# Patient Record
Sex: Female | Born: 1975 | Race: White | Hispanic: No | Marital: Single | Smoking: Former smoker
Health system: Southern US, Community
[De-identification: ages and names within clinical notes are randomized; demographics above are authoritative.]

## PROBLEM LIST (undated history)

## (undated) DIAGNOSIS — Z8719 Personal history of other diseases of the digestive system: Secondary | ICD-10-CM

## (undated) DIAGNOSIS — D649 Anemia, unspecified: Secondary | ICD-10-CM

## (undated) DIAGNOSIS — M549 Dorsalgia, unspecified: Secondary | ICD-10-CM

## (undated) DIAGNOSIS — K589 Irritable bowel syndrome without diarrhea: Secondary | ICD-10-CM

## (undated) DIAGNOSIS — R351 Nocturia: Secondary | ICD-10-CM

## (undated) DIAGNOSIS — G8929 Other chronic pain: Secondary | ICD-10-CM

## (undated) DIAGNOSIS — F329 Major depressive disorder, single episode, unspecified: Secondary | ICD-10-CM

## (undated) DIAGNOSIS — G43909 Migraine, unspecified, not intractable, without status migrainosus: Secondary | ICD-10-CM

## (undated) DIAGNOSIS — R42 Dizziness and giddiness: Secondary | ICD-10-CM

## (undated) DIAGNOSIS — K861 Other chronic pancreatitis: Secondary | ICD-10-CM

## (undated) DIAGNOSIS — Z8742 Personal history of other diseases of the female genital tract: Secondary | ICD-10-CM

## (undated) DIAGNOSIS — Z8614 Personal history of Methicillin resistant Staphylococcus aureus infection: Secondary | ICD-10-CM

## (undated) DIAGNOSIS — R3915 Urgency of urination: Secondary | ICD-10-CM

## (undated) DIAGNOSIS — R3989 Other symptoms and signs involving the genitourinary system: Secondary | ICD-10-CM

## (undated) DIAGNOSIS — F32A Depression, unspecified: Secondary | ICD-10-CM

## (undated) DIAGNOSIS — R350 Frequency of micturition: Secondary | ICD-10-CM

## (undated) DIAGNOSIS — Z90722 Acquired absence of ovaries, bilateral: Secondary | ICD-10-CM

## (undated) DIAGNOSIS — Z90710 Acquired absence of both cervix and uterus: Secondary | ICD-10-CM

## (undated) DIAGNOSIS — Z87410 Personal history of cervical dysplasia: Secondary | ICD-10-CM

## (undated) DIAGNOSIS — Z9079 Acquired absence of other genital organ(s): Secondary | ICD-10-CM

## (undated) DIAGNOSIS — F1120 Opioid dependence, uncomplicated: Secondary | ICD-10-CM

## (undated) HISTORY — DX: Major depressive disorder, single episode, unspecified: F32.9

## (undated) HISTORY — DX: Depression, unspecified: F32.A

## (undated) HISTORY — DX: Personal history of Methicillin resistant Staphylococcus aureus infection: Z86.14

## (undated) HISTORY — DX: Opioid dependence, uncomplicated: F11.20

## (undated) HISTORY — DX: Migraine, unspecified, not intractable, without status migrainosus: G43.909

## (undated) HISTORY — DX: Acquired absence of ovaries, bilateral: Z90.722

## (undated) HISTORY — DX: Acquired absence of both cervix and uterus: Z90.710

## (undated) HISTORY — DX: Irritable bowel syndrome, unspecified: K58.9

## (undated) HISTORY — DX: Acquired absence of other genital organ(s): Z90.79

## (undated) HISTORY — DX: Dizziness and giddiness: R42

---

## 1998-09-21 HISTORY — PX: LAPAROSCOPY: SHX197

## 2002-09-05 ENCOUNTER — Emergency Department (HOSPITAL_COMMUNITY): Admission: EM | Admit: 2002-09-05 | Discharge: 2002-09-05 | Payer: Self-pay | Admitting: *Deleted

## 2002-09-21 HISTORY — PX: CYSTOSCOPY WITH HYDRODISTENSION AND BIOPSY: SHX5127

## 2002-09-21 HISTORY — PX: DIAGNOSTIC LAPAROSCOPY: SUR761

## 2002-11-24 ENCOUNTER — Encounter: Admission: RE | Admit: 2002-11-24 | Discharge: 2002-11-24 | Payer: Self-pay | Admitting: Gastroenterology

## 2002-11-24 ENCOUNTER — Encounter: Payer: Self-pay | Admitting: Gastroenterology

## 2003-01-27 ENCOUNTER — Encounter: Payer: Self-pay | Admitting: Emergency Medicine

## 2003-01-27 ENCOUNTER — Emergency Department (HOSPITAL_COMMUNITY): Admission: EM | Admit: 2003-01-27 | Discharge: 2003-01-27 | Payer: Self-pay | Admitting: Emergency Medicine

## 2003-03-16 ENCOUNTER — Inpatient Hospital Stay (HOSPITAL_COMMUNITY): Admission: AD | Admit: 2003-03-16 | Discharge: 2003-03-16 | Payer: Self-pay | Admitting: Obstetrics and Gynecology

## 2003-03-20 ENCOUNTER — Encounter (INDEPENDENT_AMBULATORY_CARE_PROVIDER_SITE_OTHER): Payer: Self-pay | Admitting: Specialist

## 2003-03-20 ENCOUNTER — Ambulatory Visit (HOSPITAL_BASED_OUTPATIENT_CLINIC_OR_DEPARTMENT_OTHER): Admission: RE | Admit: 2003-03-20 | Discharge: 2003-03-20 | Payer: Self-pay | Admitting: Urology

## 2003-06-13 ENCOUNTER — Ambulatory Visit (HOSPITAL_COMMUNITY): Admission: RE | Admit: 2003-06-13 | Discharge: 2003-06-13 | Payer: Self-pay | Admitting: Gastroenterology

## 2003-06-13 ENCOUNTER — Encounter (INDEPENDENT_AMBULATORY_CARE_PROVIDER_SITE_OTHER): Payer: Self-pay | Admitting: Specialist

## 2003-07-17 ENCOUNTER — Inpatient Hospital Stay (HOSPITAL_COMMUNITY): Admission: AD | Admit: 2003-07-17 | Discharge: 2003-07-17 | Payer: Self-pay | Admitting: *Deleted

## 2003-09-22 HISTORY — PX: OTHER SURGICAL HISTORY: SHX169

## 2004-02-15 ENCOUNTER — Encounter: Admission: RE | Admit: 2004-02-15 | Discharge: 2004-02-15 | Payer: Self-pay | Admitting: Family Medicine

## 2004-04-12 ENCOUNTER — Emergency Department (HOSPITAL_COMMUNITY): Admission: EM | Admit: 2004-04-12 | Discharge: 2004-04-12 | Payer: Self-pay | Admitting: Emergency Medicine

## 2004-06-07 ENCOUNTER — Ambulatory Visit (HOSPITAL_COMMUNITY): Admission: AD | Admit: 2004-06-07 | Discharge: 2004-06-08 | Payer: Self-pay | Admitting: Obstetrics and Gynecology

## 2004-06-07 ENCOUNTER — Encounter (INDEPENDENT_AMBULATORY_CARE_PROVIDER_SITE_OTHER): Payer: Self-pay | Admitting: *Deleted

## 2004-09-21 DIAGNOSIS — Z90710 Acquired absence of both cervix and uterus: Secondary | ICD-10-CM

## 2004-09-21 HISTORY — PX: TOTAL ABDOMINAL HYSTERECTOMY W/ BILATERAL SALPINGOOPHORECTOMY: SHX83

## 2004-09-21 HISTORY — DX: Acquired absence of both cervix and uterus: Z90.710

## 2004-10-01 ENCOUNTER — Ambulatory Visit (HOSPITAL_COMMUNITY): Admission: RE | Admit: 2004-10-01 | Discharge: 2004-10-01 | Payer: Self-pay | Admitting: Family Medicine

## 2004-11-30 ENCOUNTER — Emergency Department (HOSPITAL_COMMUNITY): Admission: EM | Admit: 2004-11-30 | Discharge: 2004-11-30 | Payer: Self-pay | Admitting: Emergency Medicine

## 2004-12-01 ENCOUNTER — Emergency Department (HOSPITAL_COMMUNITY): Admission: EM | Admit: 2004-12-01 | Discharge: 2004-12-01 | Payer: Self-pay | Admitting: Emergency Medicine

## 2005-01-03 ENCOUNTER — Emergency Department (HOSPITAL_COMMUNITY): Admission: EM | Admit: 2005-01-03 | Discharge: 2005-01-03 | Payer: Self-pay | Admitting: Emergency Medicine

## 2005-01-16 ENCOUNTER — Emergency Department (HOSPITAL_COMMUNITY): Admission: EM | Admit: 2005-01-16 | Discharge: 2005-01-16 | Payer: Self-pay | Admitting: Emergency Medicine

## 2005-02-11 ENCOUNTER — Emergency Department (HOSPITAL_COMMUNITY): Admission: EM | Admit: 2005-02-11 | Discharge: 2005-02-11 | Payer: Self-pay | Admitting: Emergency Medicine

## 2005-02-15 ENCOUNTER — Emergency Department (HOSPITAL_COMMUNITY): Admission: EM | Admit: 2005-02-15 | Discharge: 2005-02-15 | Payer: Self-pay | Admitting: Emergency Medicine

## 2005-02-25 ENCOUNTER — Inpatient Hospital Stay (HOSPITAL_COMMUNITY): Admission: RE | Admit: 2005-02-25 | Discharge: 2005-02-27 | Payer: Self-pay | Admitting: Obstetrics & Gynecology

## 2005-03-07 ENCOUNTER — Encounter (HOSPITAL_COMMUNITY): Admission: RE | Admit: 2005-03-07 | Discharge: 2005-04-06 | Payer: Self-pay | Admitting: Obstetrics and Gynecology

## 2005-10-16 ENCOUNTER — Emergency Department (HOSPITAL_COMMUNITY): Admission: EM | Admit: 2005-10-16 | Discharge: 2005-10-16 | Payer: Self-pay | Admitting: Emergency Medicine

## 2005-12-31 ENCOUNTER — Emergency Department (HOSPITAL_COMMUNITY): Admission: EM | Admit: 2005-12-31 | Discharge: 2005-12-31 | Payer: Self-pay | Admitting: Emergency Medicine

## 2006-01-02 ENCOUNTER — Emergency Department (HOSPITAL_COMMUNITY): Admission: EM | Admit: 2006-01-02 | Discharge: 2006-01-02 | Payer: Self-pay | Admitting: Emergency Medicine

## 2006-01-18 ENCOUNTER — Encounter: Admission: RE | Admit: 2006-01-18 | Discharge: 2006-01-18 | Payer: Self-pay | Admitting: Family Medicine

## 2006-02-08 ENCOUNTER — Emergency Department (HOSPITAL_COMMUNITY): Admission: EM | Admit: 2006-02-08 | Discharge: 2006-02-08 | Payer: Self-pay | Admitting: Emergency Medicine

## 2006-02-12 ENCOUNTER — Encounter: Admission: RE | Admit: 2006-02-12 | Discharge: 2006-02-12 | Payer: Self-pay | Admitting: Orthopedic Surgery

## 2006-03-03 ENCOUNTER — Emergency Department (HOSPITAL_COMMUNITY): Admission: EM | Admit: 2006-03-03 | Discharge: 2006-03-03 | Payer: Self-pay | Admitting: Emergency Medicine

## 2006-08-08 ENCOUNTER — Emergency Department (HOSPITAL_COMMUNITY): Admission: EM | Admit: 2006-08-08 | Discharge: 2006-08-08 | Payer: Self-pay | Admitting: Emergency Medicine

## 2006-08-24 ENCOUNTER — Emergency Department (HOSPITAL_COMMUNITY): Admission: EM | Admit: 2006-08-24 | Discharge: 2006-08-24 | Payer: Self-pay | Admitting: Emergency Medicine

## 2006-10-13 ENCOUNTER — Emergency Department (HOSPITAL_COMMUNITY): Admission: EM | Admit: 2006-10-13 | Discharge: 2006-10-13 | Payer: Self-pay | Admitting: Emergency Medicine

## 2006-11-16 ENCOUNTER — Emergency Department (HOSPITAL_COMMUNITY): Admission: EM | Admit: 2006-11-16 | Discharge: 2006-11-16 | Payer: Self-pay | Admitting: Emergency Medicine

## 2007-01-23 ENCOUNTER — Emergency Department (HOSPITAL_COMMUNITY): Admission: EM | Admit: 2007-01-23 | Discharge: 2007-01-23 | Payer: Self-pay | Admitting: Emergency Medicine

## 2007-06-18 ENCOUNTER — Emergency Department (HOSPITAL_COMMUNITY): Admission: EM | Admit: 2007-06-18 | Discharge: 2007-06-18 | Payer: Self-pay | Admitting: Emergency Medicine

## 2007-07-06 ENCOUNTER — Emergency Department (HOSPITAL_COMMUNITY): Admission: EM | Admit: 2007-07-06 | Discharge: 2007-07-06 | Payer: Self-pay | Admitting: Family Medicine

## 2007-08-19 ENCOUNTER — Emergency Department (HOSPITAL_COMMUNITY): Admission: EM | Admit: 2007-08-19 | Discharge: 2007-08-20 | Payer: Self-pay | Admitting: Emergency Medicine

## 2007-08-23 ENCOUNTER — Inpatient Hospital Stay (HOSPITAL_COMMUNITY): Admission: EM | Admit: 2007-08-23 | Discharge: 2007-08-26 | Payer: Self-pay | Admitting: Emergency Medicine

## 2007-10-06 ENCOUNTER — Emergency Department (HOSPITAL_COMMUNITY): Admission: EM | Admit: 2007-10-06 | Discharge: 2007-10-06 | Payer: Self-pay | Admitting: Emergency Medicine

## 2008-02-10 ENCOUNTER — Emergency Department (HOSPITAL_COMMUNITY): Admission: EM | Admit: 2008-02-10 | Discharge: 2008-02-10 | Payer: Self-pay | Admitting: Emergency Medicine

## 2008-03-17 ENCOUNTER — Emergency Department (HOSPITAL_COMMUNITY): Admission: EM | Admit: 2008-03-17 | Discharge: 2008-03-17 | Payer: Self-pay | Admitting: Emergency Medicine

## 2008-05-04 ENCOUNTER — Emergency Department (HOSPITAL_COMMUNITY): Admission: EM | Admit: 2008-05-04 | Discharge: 2008-05-04 | Payer: Self-pay | Admitting: Emergency Medicine

## 2009-02-22 ENCOUNTER — Emergency Department (HOSPITAL_COMMUNITY): Admission: EM | Admit: 2009-02-22 | Discharge: 2009-02-22 | Payer: Self-pay | Admitting: Emergency Medicine

## 2009-04-23 ENCOUNTER — Encounter: Admission: RE | Admit: 2009-04-23 | Discharge: 2009-04-23 | Payer: Self-pay | Admitting: Internal Medicine

## 2009-05-30 ENCOUNTER — Encounter: Admission: RE | Admit: 2009-05-30 | Discharge: 2009-05-30 | Payer: Self-pay | Admitting: Internal Medicine

## 2009-06-11 ENCOUNTER — Emergency Department (HOSPITAL_COMMUNITY): Admission: EM | Admit: 2009-06-11 | Discharge: 2009-06-11 | Payer: Self-pay | Admitting: Emergency Medicine

## 2009-06-15 ENCOUNTER — Emergency Department (HOSPITAL_COMMUNITY): Admission: EM | Admit: 2009-06-15 | Discharge: 2009-06-15 | Payer: Self-pay | Admitting: Emergency Medicine

## 2009-06-20 ENCOUNTER — Emergency Department (HOSPITAL_COMMUNITY): Admission: EM | Admit: 2009-06-20 | Discharge: 2009-06-21 | Payer: Self-pay | Admitting: Emergency Medicine

## 2009-06-20 ENCOUNTER — Emergency Department (HOSPITAL_COMMUNITY): Admission: EM | Admit: 2009-06-20 | Discharge: 2009-06-20 | Payer: Self-pay | Admitting: Family Medicine

## 2009-06-30 ENCOUNTER — Emergency Department (HOSPITAL_COMMUNITY): Admission: EM | Admit: 2009-06-30 | Discharge: 2009-06-30 | Payer: Self-pay | Admitting: Emergency Medicine

## 2009-07-03 ENCOUNTER — Telehealth: Payer: Self-pay | Admitting: Internal Medicine

## 2009-07-04 ENCOUNTER — Emergency Department (HOSPITAL_COMMUNITY): Admission: EM | Admit: 2009-07-04 | Discharge: 2009-07-04 | Payer: Self-pay | Admitting: Emergency Medicine

## 2009-07-14 ENCOUNTER — Emergency Department (HOSPITAL_COMMUNITY): Admission: EM | Admit: 2009-07-14 | Discharge: 2009-07-14 | Payer: Self-pay | Admitting: Emergency Medicine

## 2009-07-17 ENCOUNTER — Encounter (INDEPENDENT_AMBULATORY_CARE_PROVIDER_SITE_OTHER): Payer: Self-pay | Admitting: Surgery

## 2009-07-17 ENCOUNTER — Ambulatory Visit (HOSPITAL_COMMUNITY): Admission: RE | Admit: 2009-07-17 | Discharge: 2009-07-18 | Payer: Self-pay | Admitting: Surgery

## 2009-07-17 HISTORY — PX: LAPAROSCOPIC CHOLECYSTECTOMY: SUR755

## 2009-07-22 ENCOUNTER — Encounter: Admission: RE | Admit: 2009-07-22 | Discharge: 2009-07-22 | Payer: Self-pay | Admitting: Surgery

## 2009-08-12 ENCOUNTER — Emergency Department (HOSPITAL_COMMUNITY): Admission: EM | Admit: 2009-08-12 | Discharge: 2009-08-12 | Payer: Self-pay | Admitting: Family Medicine

## 2009-08-25 ENCOUNTER — Emergency Department (HOSPITAL_COMMUNITY): Admission: EM | Admit: 2009-08-25 | Discharge: 2009-08-25 | Payer: Self-pay | Admitting: Family Medicine

## 2009-09-23 ENCOUNTER — Emergency Department (HOSPITAL_COMMUNITY): Admission: EM | Admit: 2009-09-23 | Discharge: 2009-09-23 | Payer: Self-pay | Admitting: Emergency Medicine

## 2009-12-18 ENCOUNTER — Ambulatory Visit (HOSPITAL_COMMUNITY): Admission: RE | Admit: 2009-12-18 | Discharge: 2009-12-18 | Payer: Self-pay | Admitting: Gastroenterology

## 2009-12-18 HISTORY — PX: ERCP: SHX60

## 2009-12-20 ENCOUNTER — Emergency Department (HOSPITAL_COMMUNITY): Admission: EM | Admit: 2009-12-20 | Discharge: 2009-12-20 | Payer: Self-pay | Admitting: Emergency Medicine

## 2009-12-27 ENCOUNTER — Encounter: Payer: Self-pay | Admitting: Family Medicine

## 2009-12-31 ENCOUNTER — Encounter: Admission: RE | Admit: 2009-12-31 | Discharge: 2009-12-31 | Payer: Self-pay | Admitting: Gastroenterology

## 2010-04-14 ENCOUNTER — Emergency Department (HOSPITAL_COMMUNITY): Admission: EM | Admit: 2010-04-14 | Discharge: 2010-04-14 | Payer: Self-pay | Admitting: Emergency Medicine

## 2010-06-10 ENCOUNTER — Encounter: Admission: RE | Admit: 2010-06-10 | Discharge: 2010-06-10 | Payer: Self-pay | Admitting: Internal Medicine

## 2010-06-16 ENCOUNTER — Emergency Department (HOSPITAL_COMMUNITY): Admission: EM | Admit: 2010-06-16 | Discharge: 2010-06-16 | Payer: Self-pay | Admitting: Emergency Medicine

## 2010-09-03 ENCOUNTER — Encounter
Admission: RE | Admit: 2010-09-03 | Discharge: 2010-09-03 | Payer: Self-pay | Source: Home / Self Care | Attending: Internal Medicine | Admitting: Internal Medicine

## 2010-10-21 NOTE — Miscellaneous (Signed)
Summary: request for vicodin RF - not ever prescribed  received request for vicodin rf.  never was prescribed for this patient.   called [facility] [address] [facility] - looked up prescription information from original script.  error in putting prescriber in chart - was actually dr Ewing Schlein (GI). this was corrected in their records.  review however of controlled substances database is certainly worrisome however for doctor shopping for narcotics as it revealed in the past year more than 31 scripts for narcotics from 18 different physicians. (I am unclear if some are in the same practice however) Ancil Boozer  MD  December 27, 2009 4:09 PM    Clinical Lists Changes

## 2010-12-04 LAB — URINALYSIS, ROUTINE W REFLEX MICROSCOPIC
Glucose, UA: NEGATIVE mg/dL
Hgb urine dipstick: NEGATIVE
Protein, ur: 30 mg/dL — AB

## 2010-12-04 LAB — PROTIME-INR: Prothrombin Time: 12.3 seconds (ref 11.6–15.2)

## 2010-12-04 LAB — APTT: aPTT: 32 seconds (ref 24–37)

## 2010-12-04 LAB — COMPREHENSIVE METABOLIC PANEL
ALT: 16 U/L (ref 0–35)
Alkaline Phosphatase: 120 U/L — ABNORMAL HIGH (ref 39–117)
BUN: 6 mg/dL (ref 6–23)
CO2: 26 mEq/L (ref 19–32)
Creatinine, Ser: 0.74 mg/dL (ref 0.4–1.2)
GFR calc Af Amer: >60 mL/min (ref >60)
GFR calc non Af Amer: >60 mL/min (ref >60)
Sodium: 138 mEq/L (ref 135–145)

## 2010-12-04 LAB — CBC
HCT: 41.5 % (ref 36.0–46.0)
MCH: 33.2 pg (ref 26.0–34.0)
RBC: 4.38 MIL/uL (ref 3.87–5.11)

## 2010-12-04 LAB — URINE MICROSCOPIC-ADD ON

## 2010-12-07 LAB — CBC
HCT: 36 % (ref 36.0–46.0)
MCV: 93.2 fL (ref 78.0–100.0)
Platelets: 228 10*3/uL (ref 150–400)
RDW: 12.1 % (ref 11.5–15.5)

## 2010-12-07 LAB — COMPREHENSIVE METABOLIC PANEL
Albumin: 3.8 g/dL (ref 3.5–5.2)
BUN: 7 mg/dL (ref 6–23)
Creatinine, Ser: 0.72 mg/dL (ref 0.4–1.2)
Potassium: 3.6 mEq/L (ref 3.5–5.1)
Total Protein: 6.5 g/dL (ref 6.0–8.3)

## 2010-12-07 LAB — URINALYSIS, ROUTINE W REFLEX MICROSCOPIC
Bilirubin Urine: NEGATIVE
Glucose, UA: NEGATIVE mg/dL
Hgb urine dipstick: NEGATIVE
Ketones, ur: NEGATIVE mg/dL
Protein, ur: NEGATIVE mg/dL
Urobilinogen, UA: 0.2 mg/dL (ref 0.0–1.0)
pH: 6.5 (ref 5.0–8.0)

## 2010-12-07 LAB — URINE CULTURE: Colony Count: NO GROWTH

## 2010-12-07 LAB — URINE MICROSCOPIC-ADD ON

## 2010-12-07 LAB — DIFFERENTIAL
Lymphocytes Relative: 33 % (ref 12–46)
Lymphs Abs: 2.9 10*3/uL (ref 0.7–4.0)
Monocytes Absolute: 0.7 10*3/uL (ref 0.1–1.0)
Monocytes Relative: 8 % (ref 3–12)
Neutro Abs: 4.9 10*3/uL (ref 1.7–7.7)

## 2010-12-07 LAB — POCT PREGNANCY, URINE: Preg Test, Ur: NEGATIVE

## 2010-12-10 LAB — DIFFERENTIAL
Basophils Absolute: 0 10*3/uL (ref 0.0–0.1)
Eosinophils Absolute: 0.4 10*3/uL (ref 0.0–0.7)
Lymphs Abs: 2.3 10*3/uL (ref 0.7–4.0)
Neutrophils Relative %: 54 % (ref 43–77)

## 2010-12-10 LAB — CBC
Hemoglobin: 13.9 g/dL (ref 12.0–15.0)
MCHC: 33.9 g/dL (ref 30.0–36.0)
MCV: 93.8 fL (ref 78.0–100.0)
RBC: 4.37 MIL/uL (ref 3.87–5.11)

## 2010-12-10 LAB — LIPASE, BLOOD: Lipase: 29 U/L (ref 11–59)

## 2010-12-10 LAB — COMPREHENSIVE METABOLIC PANEL
ALT: 35 U/L (ref 0–35)
CO2: 29 mEq/L (ref 19–32)
Calcium: 9.2 mg/dL (ref 8.4–10.5)
Creatinine, Ser: 0.7 mg/dL (ref 0.4–1.2)
GFR calc non Af Amer: >60 mL/min (ref >60)
Glucose, Bld: 82 mg/dL (ref 70–99)
Total Bilirubin: 0.5 mg/dL (ref 0.3–1.2)

## 2010-12-14 LAB — TYPE AND SCREEN: Antibody Screen: NEGATIVE

## 2010-12-14 LAB — HEMOGLOBIN AND HEMATOCRIT, BLOOD: Hemoglobin: 13.1 g/dL (ref 12.0–15.0)

## 2010-12-23 LAB — POCT URINALYSIS DIP (DEVICE)
Bilirubin Urine: NEGATIVE
Hgb urine dipstick: NEGATIVE
Ketones, ur: NEGATIVE mg/dL
Nitrite: NEGATIVE
Specific Gravity, Urine: <=1.005 (ref 1.005–1.030)
pH: 6 (ref 5.0–8.0)

## 2010-12-24 LAB — COMPREHENSIVE METABOLIC PANEL
ALT: 26 U/L (ref 0–35)
AST: 19 U/L (ref 0–37)
Albumin: 4 g/dL (ref 3.5–5.2)
Alkaline Phosphatase: 109 U/L (ref 39–117)
CO2: 27 mEq/L (ref 19–32)
Chloride: 103 mEq/L (ref 96–112)
Glucose, Bld: 83 mg/dL (ref 70–99)
Total Protein: 6.8 g/dL (ref 6.0–8.3)

## 2010-12-24 LAB — DIFFERENTIAL
Basophils Relative: 1 % (ref 0–1)
Eosinophils Absolute: 0.4 10*3/uL (ref 0.0–0.7)
Eosinophils Relative: 5 % (ref 0–5)
Monocytes Absolute: 0.6 10*3/uL (ref 0.1–1.0)
Monocytes Relative: 7 % (ref 3–12)
Neutro Abs: 5.6 10*3/uL (ref 1.7–7.7)

## 2010-12-24 LAB — CBC
HCT: 38.6 % (ref 36.0–46.0)
Hemoglobin: 13.4 g/dL (ref 12.0–15.0)
MCHC: 34.8 g/dL (ref 30.0–36.0)
MCV: 92.8 fL (ref 78.0–100.0)
RBC: 4.16 MIL/uL (ref 3.87–5.11)

## 2010-12-25 LAB — URINALYSIS, ROUTINE W REFLEX MICROSCOPIC
Bilirubin Urine: NEGATIVE
Glucose, UA: NEGATIVE mg/dL
Glucose, UA: NEGATIVE mg/dL
Hgb urine dipstick: NEGATIVE
Ketones, ur: NEGATIVE mg/dL
Nitrite: NEGATIVE
Nitrite: NEGATIVE
Protein, ur: NEGATIVE mg/dL
Specific Gravity, Urine: 1.014 (ref 1.005–1.030)
Urobilinogen, UA: 0.2 mg/dL (ref 0.0–1.0)
pH: 6.5 (ref 5.0–8.0)
pH: 7 (ref 5.0–8.0)

## 2010-12-25 LAB — COMPREHENSIVE METABOLIC PANEL
ALT: 30 U/L (ref 0–35)
ALT: 31 U/L (ref 0–35)
AST: 22 U/L (ref 0–37)
AST: 25 U/L (ref 0–37)
Albumin: 4.1 g/dL (ref 3.5–5.2)
Albumin: 4.1 g/dL (ref 3.5–5.2)
Alkaline Phosphatase: 89 U/L (ref 39–117)
Alkaline Phosphatase: 91 U/L (ref 39–117)
Alkaline Phosphatase: 94 U/L (ref 39–117)
BUN: 6 mg/dL (ref 6–23)
BUN: 2 mg/dL — ABNORMAL LOW (ref 6–23)
BUN: 3 mg/dL — ABNORMAL LOW (ref 6–23)
CO2: 25 mEq/L (ref 19–32)
Calcium: 8.9 mg/dL (ref 8.4–10.5)
Calcium: 9.1 mg/dL (ref 8.4–10.5)
Chloride: 103 mEq/L (ref 96–112)
Chloride: 103 mEq/L (ref 96–112)
Creatinine, Ser: 0.63 mg/dL (ref 0.4–1.2)
GFR calc Af Amer: >60 mL/min (ref >60)
GFR calc non Af Amer: >60 mL/min (ref >60)
Glucose, Bld: 84 mg/dL (ref 70–99)
Glucose, Bld: 90 mg/dL (ref 70–99)
Potassium: 3.4 mEq/L — ABNORMAL LOW (ref 3.5–5.1)
Potassium: 3.8 mEq/L (ref 3.5–5.1)
Sodium: 136 mEq/L (ref 135–145)
Sodium: 138 mEq/L (ref 135–145)
Total Bilirubin: 0.5 mg/dL (ref 0.3–1.2)
Total Bilirubin: 0.7 mg/dL (ref 0.3–1.2)
Total Protein: 6.2 g/dL (ref 6.0–8.3)
Total Protein: 6.5 g/dL (ref 6.0–8.3)

## 2010-12-25 LAB — DIFFERENTIAL
Basophils Absolute: 0.1 10*3/uL (ref 0.0–0.1)
Basophils Absolute: 0 10*3/uL (ref 0.0–0.1)
Basophils Relative: 0 % (ref 0–1)
Basophils Relative: 1 % (ref 0–1)
Basophils Relative: 2 % — ABNORMAL HIGH (ref 0–1)
Eosinophils Absolute: 0.3 10*3/uL (ref 0.0–0.7)
Eosinophils Absolute: 0.8 10*3/uL — ABNORMAL HIGH (ref 0.0–0.7)
Eosinophils Relative: 3 % (ref 0–5)
Eosinophils Relative: 9 % — ABNORMAL HIGH (ref 0–5)
Lymphocytes Relative: 15 % (ref 12–46)
Lymphs Abs: 1.7 10*3/uL (ref 0.7–4.0)
Lymphs Abs: 2.5 10*3/uL (ref 0.7–4.0)
Monocytes Absolute: 0.5 10*3/uL (ref 0.1–1.0)
Monocytes Relative: 7 % (ref 3–12)
Monocytes Relative: 8 % (ref 3–12)
Neutro Abs: 6.4 10*3/uL (ref 1.7–7.7)
Neutro Abs: 6.5 10*3/uL (ref 1.7–7.7)
Neutrophils Relative %: 52 % (ref 43–77)
Neutrophils Relative %: 71 % (ref 43–77)

## 2010-12-25 LAB — CBC
HCT: 41.3 % (ref 36.0–46.0)
HCT: 37.7 % (ref 36.0–46.0)
HCT: 38.4 % (ref 36.0–46.0)
Hemoglobin: 12.8 g/dL (ref 12.0–15.0)
Hemoglobin: 13.1 g/dL (ref 12.0–15.0)
MCHC: 34.8 g/dL (ref 30.0–36.0)
MCV: 93.5 fL (ref 78.0–100.0)
MCV: 93.2 fL (ref 78.0–100.0)
Platelets: 234 10*3/uL (ref 150–400)
Platelets: 209 10*3/uL (ref 150–400)
RBC: 3.91 MIL/uL (ref 3.87–5.11)
RDW: 11.8 % (ref 11.5–15.5)
RDW: 11.9 % (ref 11.5–15.5)
WBC: 8.5 10*3/uL (ref 4.0–10.5)
WBC: 9.4 10*3/uL (ref 4.0–10.5)

## 2010-12-25 LAB — POCT PREGNANCY, URINE: Preg Test, Ur: NEGATIVE

## 2010-12-25 LAB — URINE CULTURE

## 2010-12-25 LAB — LIPASE, BLOOD
Lipase: 30 U/L (ref 11–59)
Lipase: 162 U/L — ABNORMAL HIGH (ref 11–59)

## 2010-12-26 LAB — POCT URINALYSIS DIP (DEVICE)
Bilirubin Urine: NEGATIVE
Glucose, UA: NEGATIVE mg/dL
Ketones, ur: NEGATIVE mg/dL
Ketones, ur: NEGATIVE mg/dL
Nitrite: NEGATIVE
Specific Gravity, Urine: <=1.005 (ref 1.005–1.030)
pH: 5.5 (ref 5.0–8.0)
pH: 7 (ref 5.0–8.0)

## 2010-12-26 LAB — COMPREHENSIVE METABOLIC PANEL
BUN: 5 mg/dL — ABNORMAL LOW (ref 6–23)
BUN: 8 mg/dL (ref 6–23)
CO2: 28 mEq/L (ref 19–32)
CO2: 28 mEq/L (ref 19–32)
Calcium: 9.2 mg/dL (ref 8.4–10.5)
Chloride: 103 mEq/L (ref 96–112)
Creatinine, Ser: 0.74 mg/dL (ref 0.4–1.2)
Creatinine, Ser: 0.75 mg/dL (ref 0.4–1.2)
GFR calc non Af Amer: >60 mL/min (ref >60)
GFR calc non Af Amer: >60 mL/min (ref >60)
Glucose, Bld: 88 mg/dL (ref 70–99)
Sodium: 136 mEq/L (ref 135–145)
Total Bilirubin: 0.4 mg/dL (ref 0.3–1.2)
Total Protein: 6.8 g/dL (ref 6.0–8.3)

## 2010-12-26 LAB — URINALYSIS, ROUTINE W REFLEX MICROSCOPIC
Bilirubin Urine: NEGATIVE
Glucose, UA: NEGATIVE mg/dL
Hgb urine dipstick: NEGATIVE
Ketones, ur: NEGATIVE mg/dL
Ketones, ur: NEGATIVE mg/dL
Protein, ur: NEGATIVE mg/dL
Protein, ur: NEGATIVE mg/dL
Urobilinogen, UA: 0.2 mg/dL (ref 0.0–1.0)

## 2010-12-26 LAB — DIFFERENTIAL
Basophils Absolute: 0.1 10*3/uL (ref 0.0–0.1)
Basophils Relative: 1 % (ref 0–1)
Eosinophils Relative: 3 % (ref 0–5)
Lymphocytes Relative: 19 % (ref 12–46)
Lymphs Abs: 2.6 10*3/uL (ref 0.7–4.0)
Monocytes Relative: 7 % (ref 3–12)
Neutro Abs: 4.3 10*3/uL (ref 1.7–7.7)
Neutro Abs: 7.3 10*3/uL (ref 1.7–7.7)
Neutrophils Relative %: 55 % (ref 43–77)

## 2010-12-26 LAB — LIPASE, BLOOD
Lipase: 29 U/L (ref 11–59)
Lipase: 73 U/L — ABNORMAL HIGH (ref 11–59)
Lipase: 80 U/L — ABNORMAL HIGH (ref 11–59)

## 2010-12-26 LAB — CBC
HCT: 38 % (ref 36.0–46.0)
HCT: 40.5 % (ref 36.0–46.0)
Hemoglobin: 13 g/dL (ref 12.0–15.0)
Hemoglobin: 14 g/dL (ref 12.0–15.0)
MCHC: 34.4 g/dL (ref 30.0–36.0)
MCV: 92.9 fL (ref 78.0–100.0)
MCV: 93.4 fL (ref 78.0–100.0)
RDW: 11.9 % (ref 11.5–15.5)
RDW: 12 % (ref 11.5–15.5)
WBC: 10.3 10*3/uL (ref 4.0–10.5)

## 2010-12-26 LAB — POCT I-STAT, CHEM 8
BUN: 12 mg/dL (ref 6–23)
Chloride: 104 mEq/L (ref 96–112)
Creatinine, Ser: 0.8 mg/dL (ref 0.4–1.2)
Potassium: 4.6 mEq/L (ref 3.5–5.1)
Sodium: 137 mEq/L (ref 135–145)
TCO2: 26 mmol/L (ref 0–100)

## 2010-12-29 LAB — POCT I-STAT, CHEM 8
BUN: 9 mg/dL (ref 6–23)
Calcium, Ion: 1.06 mmol/L — ABNORMAL LOW (ref 1.12–1.32)
Chloride: 105 mEq/L (ref 96–112)
Glucose, Bld: 94 mg/dL (ref 70–99)
TCO2: 24 mmol/L (ref 0–100)

## 2010-12-29 LAB — URINE CULTURE

## 2010-12-29 LAB — URINE MICROSCOPIC-ADD ON

## 2010-12-29 LAB — URINALYSIS, ROUTINE W REFLEX MICROSCOPIC
Glucose, UA: NEGATIVE mg/dL
Ketones, ur: NEGATIVE mg/dL
Nitrite: POSITIVE — AB
Protein, ur: 100 mg/dL — AB
pH: 6.5 (ref 5.0–8.0)

## 2011-02-03 NOTE — Discharge Summary (Signed)
NAMELEYTON, Brown NO.:  [id number]   MEDICAL RECORD NO.:  [id number]          PATIENT TYPE:  INP   LOCATION:  1303                         FACILITY:  [facility]   PHYSICIAN:  Marcellus Scott, MD     DATE OF BIRTH:  12-Apr-1976   DATE OF ADMISSION:  08/22/2007  DATE OF DISCHARGE:  08/26/2007                               DISCHARGE SUMMARY   PRIMARY CARE PHYSICIAN:  Ernestina Penna, M.D., [facility].   GASTROENTEROLOGIST:  Petra Kuba, M.D.   DISCHARGE DIAGNOSES:  1. Right lower quadrant abdominal pain, unclear etiology, ? secondary      to adhesions secondary to her abdominal surgeries.  2. Abnormal liver function tests.  3. Facial rash.  4. History of irritable bowel syndrome.   DISCHARGE MEDICATIONS:  1. Librax 1 every 6-8 hours p.r.n.  2. Hemorrhoidal cream over-the-counter.  3. Percocet 3/325 1-2 tablets p.o. q.6h. p.r.n.  4. Zofran 4 mg 1-2 tablets p.o. q.8h. p.r.n.  5. Protonix 40 mg p.o. daily.  6. Nystatin vaginal 100,000 units, 1 tablet per vagina q.h.s. for 12      days which will complete a 14-day course.  7. Benadryl 25 mg p.o. q.6h. p.r.n.  8. Multivitamins 1 p.o. daily.  9. Hormonal patch as the patient used before, to continue same.   PROCEDURE:  1. Upper GI series with small-bowel follow-through.  Impression:  No      pathological findings of upper GI tract or small bowel.  2. Ultrasound of the abdomen.  Impression:  Negative, normal      ultrasound.  3. X-ray of the abdomen.  Impression:  Contrast within the colon.      Therefore, upper GI/small-bowel follow-through was rescheduled.  4. Abdominal x-ray on August 23, 2007.  Impression:  Nonspecific      nonobstructive bowel gas pattern.  Contrast media noted throughout      the colon from recent CT of the abdomen.  5. Abdominal CT with contrast.  Impression:  No acute abnormality on      CT of the abdomen.  6. Pelvic CT with contrast.  Impression:  No acute  abnormality of the      CT of the pelvis.  The appendix appears normal.   PERTINENT LABORATORY DATA:  Comprehensive metabolic panel on August 24, 2007 was normal, except glucose 66, total protein 5.9, albumin 3.4.  BUN  was 9, creatinine 0.8.  CBC on August 24, 2007 normal with hemoglobin  12.5, hematocrit 36, white blood cell count 22, platelets 207.  Urinalysis was not suggestive of a urinary tract infection.  Serum  lipase was 20.   CONSULTATIONS:  1. General surgery, Lorne Skeens. Hoxworth, M.D.  2. Gastroenterology, Petra Kuba, M.D.   HOSPITAL COURSE AND PATIENT DISPOSITION:  For details of the initial  admission, please refer to the history and physical note.  In summary,  Tricia Brown is a pleasant 35 year old Caucasian female patient with  history of irritable bowel syndrome, endometriosis status post total  abdominal hysterectomy and bilateral salpingo-oophorectomy who presented  with 5-day history of mainly right-sided lower  abdominal pain since  Thanksgiving.  She was seen in the emergency room and had workup  including CT of the abdomen and pelvis, and she was discharged home.  However, her pain continued to get worse following which she contacted  her gastroenterologist who was unable to see her in the office at that  time.  The patient subsequently came to the emergency room and was  admitted for further evaluation and management.   She had history of completing a course of p.o. antibiotics recently.  There was 1 soft BM with mucous and streaks of blood.  She did not have  any fever, chills, or rigors.  She was nauseous but not vomiting.  The  patient was admitted with no fever, no leukocytosis, but tender right  lower quadrant.  She was on pain medications which mainly involved IV  p.r.n. Dilaudid and p.r.n. IV Toradol.  She had initially been made  n.p.o. and was on IV fluid hydration.  A surgical consult was obtained  who followed her through the hospital stay  and did not think that she  had a surgical or an acute abdomen.  An ultrasound of the abdomen was  done for mildly elevated LFTs to rule out gallbladder disease which was  negative.  GI also consulted on the patient and suggested upper GI  series with small-bowel follow-through which was negative.  The etiology  of her pain was not fully determined yet, but it was thought to be  secondary to adhesions secondary to her abdominal surgeries.  In any  case, GI has advanced her diet and have cleared her for discharge.  They  will follow her up on Monday, August 29, 2007.  The patient is being  sent home on the above medications.  She has been instructed to seek  immediate medical attention if there is any further deterioration of her  condition.  Two days ago, the patient noted a slightly reddish rash on  both of her malar regions and chin but with no pruritus.  The etiology  of this was not clear.  A moisturizing cream was advised, and the rash  has subsequently started to fade.  The patient has also noted a small  swelling at the site of pneumonia shot on the right upper arm laterally  but no itching or pain.  The patient has been instructed to monitor that  closely and follow up with PMD if there is any worsening of symptoms or  signs there.      Marcellus Scott, MD  Electronically Signed     AH/MEDQ  D:  08/26/2007  T:  08/26/2007  Job:  [id number]   cc:   Ernestina Penna, M.D.  Fax: [phone]   Petra Kuba, M.D.  Fax: [phone]   Lorne Skeens. Hoxworth, M.D.  [address], [address]  [place]  [place] [address]

## 2011-02-03 NOTE — Consult Note (Signed)
Tricia Brown, Tricia Brown           ACCOUNT NO.:  [id number]   MEDICAL RECORD NO.:  [id number]          PATIENT TYPE:  INP   LOCATION:  1303                         FACILITY:  [facility]   PHYSICIAN:  James L. Malon Kindle., M.D.DATE OF BIRTH:  [birth year]   DATE OF CONSULTATION:  08/23/2007  DATE OF DISCHARGE:                                 CONSULTATION   REFERRING PHYSICIAN:  Michaelyn Barter, M.D.   PRIMARY CARE PHYSICIAN:  Ernestina Penna, M.D., [place] [place].   PRIMARY GASTROENTEROLOGIST:  Petra Kuba, M.D.   REASON FOR CONSULTATION:  Right upper quadrant abdominal pain and  nausea.   HISTORY:  The patient is a 35 year old white female who was in her usual  state of health until approximately August 16, 2007 or August 17, 2007 when she developed right-sided abdominal pain.  It got  progressively worse.  It was worsened by eating and she had vomiting.  She was seen in the emergency room.  Things looked pretty good.  Labs  looked okay.  She was sent home and was to be seen by Dr. Ewing Schlein as an  outpatient.  She got sicker and had to be admitted.  She had mucusy  stool with streaks of blood.  She has had chronic irritable bowel  syndrome and often has mucusy loose stool.  The pain has been entirely  right-sided.  It radiates to her lower back, up into her mid abdomen and  to her back.  It is worsened by eating.  If she eats, the pain gets  worse and she vomits.  She did have a CT scan done upon admission and  this showed a normal pancreas and no gross abnormalities on the CT.  Her  pelvic CT was normal as well.  The admission lab work revealed an  urinalysis with no blood, no leukocytes, normal lipase.  Liver tests  were remarkable for a normal bilirubin, alk phosphorus, AST and ALT.  The patient's white count was 8.4.  She does feel somewhat better than  yesterday after IV hydration and pain medications.   CURRENT MEDICATIONS:  1. Dilaudid.  2. Zofran.  3. Proton  pump inhibitor.  4. As an outpatient, she has been taking Percocet, hormone patch and      vitamins.   ALLERGIES:  NO DRUG ALLERGIES.   PAST MEDICAL HISTORY:  1. History of irritable bowel with colonoscopy in the past by Dr.      Ewing Schlein a number of years ago.  2. She has had chronic pelvic pain.  It has been felt to be due to      endometriosis.  Subsequently in 2006, underwent a hysterectomy with      bilateral salpingo-oophorectomy with lysis of adhesions.  3. She had previous laparoscopy for lysis of adhesions in 2004.  All      this apparently was felt to be due to endometriosis.  She notes      that the lower pain that she had at that time is completely      resolved.  4. Only other surgery has been D&C.  5. She has  had a colonoscopy in 2004.   FAMILY HISTORY:  Remarkable for diabetes and hypertension.  She notes  her grandmother had gallbladder disease.  She is not really aware of any  other gallbladder disease in the family.   SOCIAL HISTORY:  She is married, has 1 child, is a Physicist, medical and  a Audiological scientist.  Smokes  half-a-pack a day.  Does not drink   PHYSICAL EXAMINATION:  VITAL SIGNS:  Unremarkable.  The patient is  afebrile.  GENERAL:  Talkative white female in no obvious distress.  HEENT:  Throat:  Mucous membranes are moist.  Eyes:  Sclerae nonicteric.  LUNGS:  Clear.  HEART:  Regular rate and rhythm without murmurs or gallops.  ABDOMEN:  Nondistended.  Bowel sounds are present.  There is mild right-  sided tenderness, possibly more in the right upper quadrant than lower,  but not really very well localized.  No guarding or peritoneal signs.  The patient has been getting Dilaudid.   ASSESSMENT:  Fairly acute right-sided abdominal pain.  This is somewhat  suspicious, but not entirely typical gallbladder disease.  I think we  certainly need to rule out that.  If that is not positive with  ultrasound evaluation, we may need to do an endoscopy or a  hepatobiliary  scan.   PLAN:  We will check the results of the gallbladder ultrasound and Dr.  Ewing Schlein will see tomorrow.           ______________________________  Llana Aliment Malon Kindle., M.D.     Waldron Session  D:  08/23/2007  T:  08/23/2007  Job:  [id number]   cc:   Ernestina Penna, M.D.  Fax: [phone]   Petra Kuba, M.D.  Fax: [phone]   Lorne Skeens. Hoxworth, M.D.  [address]  [place]  [place] [address]

## 2011-02-03 NOTE — H&P (Signed)
Tricia Brown, Tricia Brown NO.:  [id number]   MEDICAL RECORD NO.:  [id number]          PATIENT TYPE:  EMS   LOCATION:  ED                           FACILITY:  [facility]   PHYSICIAN:  Michaelyn Barter, M.D. DATE OF BIRTH:  1976-07-01   DATE OF ADMISSION:  08/22/2007  DATE OF DISCHARGE:                              HISTORY & PHYSICAL   PRIMARY CARE PHYSICIAN:  Ernestina Penna, M.D., [facility].   GASTROENTEROLOGIST:  Petra Kuba, M.D.   CHIEF COMPLAINT:  Abdominal pain.   HISTORY OF PRESENT ILLNESS:  The patient is a 35 year old female who  states that she developed abdominal pain the day before Thanksgiving  which is five days ago.  She stated that the pain started out as an  occasional sharp pain involving her right middle and lower abdominal  quadrants.  She indicated that the pain, however, became more constant  and as it became more constant, it began to radiate around the right  lower flank area.  She indicates that she came to the emergency  department Friday night which was two nights after the pain started and  was subsequently worked up in the ED.  She was told that her workup was  negative and discharged home.  She was also told that if her pain  persisted or if she developed nausea and vomiting, she should contact  her gastroenterologist.  She stated that she did call her  gastroenterologist's office, however, was told that she would not be  able to be seen immediately.  Her symptoms persisted and this morning  she experienced an episode of emesis.  She stated that from  approximately 11 p.m. to 3 a.m. she felt even worse.  She had three  episodes of emesis described as watery yellow in color.  She also had a  bowel movement which consisted of mucous with small streaks of blood  present.  She decided to come to the hospital for further evaluation.  She identified food as aggravating her abdominal pain.  She indicated  that lying in  a fetal position helps to alleviate her pain somewhat.  She has never had similar pain before.  There have been no fevers, but  she does have some occasional chills.   PAST MEDICAL HISTORY:  1. Irritable bowel syndrome.  2. Endometriosis with endometriomas.  3. Dyspareunia.  4. Dysmenorrhea.  5. Menometrorrhagia.   PAST SURGICAL HISTORY:  1. Total abdominal hysterectomy with bilateral salpingo-oophorectomy      completed February 25, 2005.  2. D&C completed September of 2005.  3. Colonoscopy completed June 13, 2003, secondary to abdominal      pain and diarrhea during which time internal and external small      hemorrhoids were identified.  4. Open laparoscopy accompanied by lysis of adhesions secondary to      pelvic pain completed June 2004 by Gerri Spore B. Earlene Plater, M.D.   ALLERGIES:  No known drug allergies.   MEDICATIONS:  1. Zantac.  2. Zofran.  3. Percocet.  4. Hormone patch.  5. Multivitamin.   SOCIAL HISTORY:  Cigarettes; the patient  smokes less than one pack per  day.  She started smoking at the age of 73.  Alcohol; never.   FAMILY HISTORY:  Mother has diabetes.  Father has hypertension.   REVIEW OF SYSTEMS:  As per HPI.   PHYSICAL EXAMINATION:  VITAL SIGNS:  Temperature 97.7, blood pressure  118/65, heart rate 118, respirations 18, O2 saturation 96%.  HEENT:  Normocephalic and atraumatic.  Anicteric.  Extraocular movements  are intact.  Oral mucosa is pink.  No thrush and no exudates.  NECK:  Supple with no JVD.  Thyroid is full.  HEART:  S1 and S2 present, regular rate and rhythm.  No murmurs, rubs,  or gallops.  LUNGS:  No crackles or wheezes.  ABDOMEN:  Flat, soft.  There is some discomfort within the right mid to  lower abdominal quadrants.  The patient also indicates as I palpate the  left side of her abdomen that there is no tenderness to palpation, but  she states that there is some referred discomfort within the right lower  quadrants of her abdomen.   Bowel sounds are present.  No masses are  palpated.  EXTREMITIES:  No leg edema.  NEUROLOGY:  The patient is alert and oriented x3.  Cranial nerves II-XII  grossly intact.  MUSCULOSKELETAL:  5/5 upper and lower extremity strength.   LABORATORY DATA:  Urinalysis is negative.  Lipase 20, sodium 139,  potassium 3.9, chloride 100, CO2 28, glucose 92, BUN 7, creatinine 0.79.  Bilirubin total 0.9, alkaline phosphatase 111, SGOT 26, SGPT 38, total  protein 7.0, albumin 4.1, calcium 9.3.  White blood cell count 8.4,  hemoglobin 15.2, hematocrit 44.0, platelets 287.   The patient had a CAT scan completed on August 20, 2007.  It revealed  no acute abnormalities of the CT of the abdomen.  CT of the pelvis also  was negative.  The appendix appeared normal.   ASSESSMENT:  1. Right-sided abdominal pain.  The etiology of this is questionable.      The patient has already had a CT scan of her abdomen and pelvis      completed only two days ago and it showed no acute abnormalities.      The patient currently remains afebrile with a normal white blood      cell count.  Therefore the underlying etiology is questionable of      her current pain.  We will consider repeat CT scan of the patient's      abdomen.  We may consider consultation with gastroenterology versus      general surgery, although currently the patient's abdomen does not      appear to be surgical.  We will provide the patient with p.r.n.      pain medications as well as Protonix.  2. Nausea and vomiting.  We will continue p.r.n. antiemetics for now.  3. Dehydration.  We will provide aggressive IV fluid hydration.      Michaelyn Barter, M.D.  Electronically Signed     OR/MEDQ  D:  08/23/2007  T:  08/23/2007  Job:  [id number]   cc:   Ernestina Penna, M.D.

## 2011-02-03 NOTE — Consult Note (Signed)
Tricia Brown, Tricia Brown           ACCOUNT NO.:  [id number]   MEDICAL RECORD NO.:  [id number]          PATIENT TYPE:  INP   LOCATION:  1303                         FACILITY:  [facility]   PHYSICIAN:  Sharlet Salina T. Hoxworth, M.D.DATE OF BIRTH:  11/06/1975   DATE OF CONSULTATION:  08/23/2007  DATE OF DISCHARGE:                                 CONSULTATION   CHIEF COMPLAINT:  Right-sided abdominal pain, nausea, vomiting.   HISTORY OF PRESENT ILLNESS:  I was asked by Dr. Waymon Amato of the  [facility] Hospitalist team to evaluate Ms. Blackwell.  She is a pleasant  35 year old white female who states that on about November 27 she  developed the gradual onset of right-sided abdominal pain.  This was  initially intermittent and not particularly severe.  However, over about  a 2-day period the pain became much more intense and constant and became  associated with frequent nausea and vomiting.  She presented to the  [facility] emergency room on November 29 and was evaluated, treated  symptomatically and discharged.  She states the pain continued at home,  constant and quite severe, and she also had persistent nausea and  vomiting, unable to keep really anything down.  On December 1 she had a  small mucous stool with some streaks of blood.  This scared her and also  due to her persistent symptoms, she represented to the emergency room on  December 1 and was admitted.  The patient describes pain located chiefly  in the right lower quadrant.  There is some radiation around to her low  back and also some radiation up into the right mid abdomen and some  discomfort in the right upper quadrant as well.  She describes a  constant aching or throbbing pain with sharp, severe exacerbations.  The  pain seems to be relieved somewhat by curling into a fetal position.  Sometimes getting up and walking around will help.  She states the pain  is definitely made worse by trying to eat and she will develop quickly  nausea and vomiting.  Her vomitus appears to be just undigested food and  yellowish liquid without blood or feculent vomiting.  She has not had a  bowel movement since she presented to the emergency room the second  time.  She also has difficulty voiding, being able to only go small  amounts, and a Foley catheter was placed today for this reason.   The patient really does not have any significant chronic abdominal  complaints.  She had a history of endometriosis and pelvic pain but this  resolved completely after a TAH-BSO in 2004.  She does have a history of  irritable bowel with some diarrhea after certain foods but pain has not  been a component of this.  She denies any symptoms all similar to what  she is currently having.   The patient did see her local physician about 6 days prior to the onset  of this illness with apparent sinusitis and was started on Cefobid at  that time.  She not had any fever or chills.   PAST MEDICAL HISTORY:  Surgery significant  for TAH-BSO in 2004 for  endometriosis and chronic pelvic pain, which was completely resolved  following the surgery.  She has also had multiple MRSA skin abscesses  treated but none recently.  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  Medically, she does have  diagnosis of IBS and was treated for sinusitis as above.   MEDICATIONS ON ADMISSION:  The Cefobid as described above as well as  hormone replacement and multiple vitamins.  Current medications are  Dilaudid IV p.r.n., Protonix 40 mg daily, p.r.n. Phenergan, p.r.n.  Zofran.   ALLERGIES:  No known drug allergies.   SOCIAL HISTORY:  The patient is married.  She has one child.  She is  going to school full-time and working part-time as a Geneticist, molecular.  She smokes about half a pack of cigarettes per day and does  not drink alcohol.   FAMILY HISTORY:  Mother with diabetes.  Father with hypertension.   REVIEW OF  SYSTEMS:  GENERAL:  Weight stable.  No fever or chills.  RESPIRATORY:  Denies shortness breath, cough, wheezing.  CARDIAC:  She  has had some burning substernal chest pain.  This was relieved with  Protonix.  No other chest pain, palpitations, history of heart disease.  ABDOMEN:  GI as above.  GU:  As above.  MUSCULOSKELETAL:  She has mild  chronic back pain not particularly severe currently.   PHYSICAL EXAM:  Temperature is 98, pulse 71, respirations 20, blood  pressure 111/69.  GENERAL:  She is a well-nourished, well-developed white female,  obviously uncomfortable.  SKIN:  Warm and dry.  No rash or infection.  HEENT:  No palpable mass or thyromegaly.  Sclerae nonicteric.  LYMPH NODES:  No cervical, subclavicular or inguinal nodes palpable.  LUNGS:  Clear without wheezing or increased work of breathing.  CARDIAC:  Regular rate and rhythm.  No murmurs.  No edema.  No JVD.  ABDOMEN:  There is well-healed Pfannenstiel incision.  No hernias.  Abdomen is nondistended.  Bowel sounds are normoactive.  There is  moderate right lower quadrant and right midabdominal tenderness and also  mild right upper quadrant tenderness.  There is tenderness referred to  the right side with palpation of the left abdomen.  There is minimal  guarding and no evidence of peritonitis on exam.  No discernible masses  or organomegaly.  EXTREMITIES:  No joint swelling or deformity.  NEUROLOGIC:  Alert,  oriented.  Motor and sensory exams grossly normal.   LABORATORY AND X-RAY:  CBC, urinalysis, lipase and LFTs all within  normal limits except for a minimally elevated SGPT of 38.  CT of the  abdomen and pelvis is reviewed, which was negative with specifically no  evidence of colitis, appendicitis or other abnormality.   ASSESSMENT/PLAN:  A generally healthy 35 year old female with an acute  illness consisting of right-sided abdominal pain, nausea, vomiting and  one episode of mucus and blood per rectum.   At  this point I doubt an acute surgical problem, although she could  conceivably have an atypical cholecystitis.  A gallbladder ultrasound is  currently ordered, and we will follow up with this.   Her illness seemed to coincide side with onset of oral antibiotics and I  wonder if she could have an antibiotic-related gastritis or colitis.  She could have a gastritis or colitis of other etiology.  Agree with GI  consult and consider endoscopy.   This does  not appear to be a chronic problem such as adhesions or  functional abdominal pain.  Will follow with you.      Lorne Skeens. Hoxworth, M.D.  Electronically Signed     BTH/MEDQ  D:  08/23/2007  T:  08/23/2007  Job:  [id number]

## 2011-02-06 NOTE — Op Note (Signed)
NAMESTARLETTA, HOUCHIN           ACCOUNT NO.:  [id number]   MEDICAL RECORD NO.:  [id number]          PATIENT TYPE:  AMB   LOCATION:  DAY                           FACILITY:  [facility]   PHYSICIAN:  Lazaro Arms, M.D.   DATE OF BIRTH:  1975-10-02   DATE OF PROCEDURE:  02/25/2005  DATE OF DISCHARGE:                                 OPERATIVE REPORT   PREOPERATIVE DIAGNOSES:  1.  Menometrorrhagia.  2.  Dysmenorrhea.  3.  Dyspareunia.  4.  History of endometriosis, status post laparoscopy times two.   POSTOPERATIVE DIAGNOSIS:  1.  Menometrorrhagia.  2.  Dysmenorrhea.  3.  Dyspareunia.  4.  History of endometriosis, status post laparoscopy times two.   OPERATION PERFORMED:  Abdominal hysterectomy with bilateral salpingo-  oophorectomy.   SURGEON:  Lazaro Arms, M.D.   ANESTHESIA:  General endotracheal.   FINDINGS:  The patient had what appeared to me to be relatively normal  uterus, tubes and ovaries.  She had maybe a little endometriosis on her  posterior uterine wall, posterior peritoneal surfaces.  I did not see any on  the ovaries.  There was no adhesive disease, no evidence of infection, no  other pelvic abnormalities seen.   DESCRIPTION OF PROCEDURE:  The patient was taken to the operating room and  placed in supine position where she underwent general endotracheal  anesthesia.  The vagina was prepped, a Foley catheter was placed, the  abdomen was prepped and draped in the usual sterile fashion.  A small  Pfannenstiel skin incision was made and carried down sharply to the rectus  fascia which was scored in the midline and extended laterally.  The fascia  was taken off the muscle superiorly and inferiorly without difficulty.  The  muscles were divided.  The peritoneal cavity was entered.  A medium  protractor was placed.  The upper abdomen was packed away, the uterine  cornua were grasped.  The round ligaments were suture ligated and cut.  The  infundibulopelvic  ligaments were isolated, clamped, cut and double suture  ligated.  The uterine vessels were skeletonized.  The bladder flap was  developed and pushed down off the lower uterine segment.  Both uterine  vessels were clamped, cut and suture ligated.  Serial pedicles were taken  down to the cervix through the cardinal ligament, the pedicle being clamped,  cut and transfixion suture ligated.  The vagina was cross-clamped, specimen  was removed, the vaginal angle sutures were placed and the vagina was closed  with interrupted figure-of-eight sutures.  The pelvis was irrigated  vigorously.  All pedicles were found to be hemostatic.  Interceed was placed  the vagina to try to prevent postoperative adhesions for the future.  All  the packs were removed.  The protractor was removed.  The muscles of the  peritoneum were reapproximated loosely.  The fascia closed using 0 Vicryl  running, subcutaneous tissues made hemostatic and irrigated.  Skin was  closed using skin staples.  The patient tolerated the procedure well. She  experienced about 100 cc of blood loss and was taken to recovery room in  good stable condition.  All counts were correct times three.  She received  Ancef prophylactically.  All specimens went to the lab.      LHE/MEDQ  D:  02/25/2005  T:  02/25/2005  Job:  [id number]

## 2011-02-06 NOTE — Op Note (Signed)
NAME:  Tricia Brown, Tricia Brown                     ACCOUNT NO.:  [id number]   MEDICAL RECORD NO.:  [id number]                   PATIENT TYPE:  AMB   LOCATION:  ENDO                                 FACILITY:  [facility]   PHYSICIAN:  Petra Kuba, M.D.                 DATE OF BIRTH:  [birth year]   DATE OF PROCEDURE:  06/13/2003  DATE OF DISCHARGE:                                 OPERATIVE REPORT   PROCEDURE:  Colonoscopy with biopsy.   ENDOSCOPIST:  Petra Kuba, M.D.   INDICATIONS FOR PROCEDURE:  Abdominal pain and diarrhea.  Nondiagnostic  workup to date.   INFORMED CONSENT:  Consent was signed after risks, benefits, methods and  options were thoroughly discussed in the office.   MEDICATIONS USED:  Demerol 100 mg,  Versed 12.5 mg.   DESCRIPTION OF PROCEDURE:  Rectal inspection was pertinent for external  hemorrhoids, small.  Digital exam was negative.   The video pediatric adjustable colonoscope was inserted and with abdominal  pressure, rolling her on her back, easily able to be advanced to the cecum.  No obvious abnormality was seen on insertion.  The cecum was identified by  the appendiceal orifice and ileocecal valve.  In fact, the scope was  inserted a short ways into the terminal ileum, which was normal.  Photodocumentation was obtained.  Scattered random biopsies were obtained.  The scope was slowly withdrawn.  Random biopsies of the colon were done.  Prep was adequate.  There was minimal liquid stool that required washing and  suctioning.  On slow withdrawal through the colon no abnormalities were  seen.  Anal rectal pull through and retroflexion confirmed some tiny  hemorrhoids.  The scope was straightened and re advanced a short way up the  left side of the colon.  Air was suctioned and the scope removed.  The  patient tolerated the procedure.  There was no obvious remaining  complication.   ENDOSCOPIC DIAGNOSES:  1. Internal and external small hemorrhoids.  2.  Otherwise within normal limits to terminal ileum status post random     biopsies throughout.   PLAN:  Await pathology to rule out microscopic abnormality.  If benign, will  begin antispasmodic trials and follow up p.r.n. or in 6-8 weeks to recheck  symptoms and make sure no further workup plans are needed.                                               Petra Kuba, M.D.    MEM/MEDQ  D:  06/13/2003  T:  06/14/2003  Job:  [id number]   cc:   Clydie Braun L. Hal Hope, M.D.  [address] [place]  [place] [address]  Fax: [phone]   Chester Holstein. Earlene Plater, M.D.  [address]  [place]  [place] [address]  Fax: [phone]   Jamison Neighbor, M.D.  [address]  [place]  [place] [address]  Fax: [phone]

## 2011-02-06 NOTE — Discharge Summary (Signed)
NAMENIALA, Tricia Brown           ACCOUNT NO.:  [id number]   MEDICAL RECORD NO.:  [id number]          PATIENT TYPE:  INP   LOCATION:  A418                          FACILITY:  [facility]   PHYSICIAN:  Tilda Burrow, M.D. DATE OF BIRTH:  [birth year]   DATE OF ADMISSION:  02/25/2005  DATE OF DISCHARGE:  06/09/2006LH                                 DISCHARGE SUMMARY   ADMISSION DIAGNOSES:  1.  Endometriosis with endometriomas.  2.  Dyspareunia.  3.  Dysmenorrhea.  4.  Menometrorrhagia.   DISCHARGE DIAGNOSES:  1.  Endometriosis with endometriomas.  2.  Dyspareunia.  3.  Dysmenorrhea.  4.  Menometrorrhagia.  5.  History of cervical dysplasia.   PROCEDURE:  Total abdominal hysterectomy and bilateral salpingo-oophorectomy  by Dr. Despina Hidden.   HISTORY OF PRESENT ILLNESS:  This 35 year old female was status post  laparoscopy x2 for endometriosis and endometriomas admitted for  hysterectomy.  She had a history of high-grade cervical dysplasia in 1997,  with conization.  She was admitted for TAH/BSO.   LABORATORY DATA AND X-RAY FINDINGS:  Admitting hemoglobin 13.9, hematocrit  40.3.   HOSPITAL COURSE:  She underwent hysterectomy by Dr. Despina Hidden on the day of  admission with an estimated blood loss of less than 100 cc.  Pathology  report returned showing a 92 g uterus with no pathologically identifying  endometriosis.  She was considered stable for discharge home on February 27, 2005.   FOLLOW UP:  Follow up in 1 week for staple removal and 4 weeks for routine  postoperative care.   SPECIAL INSTRUCTIONS:  Counseled regarding driving, physical activity and  postop wound care.      JVF/MEDQ  D:  04/10/2005  T:  04/10/2005  Job:  [id number]

## 2011-02-06 NOTE — Op Note (Signed)
NAME:  Tricia Brown, Tricia Brown                     ACCOUNT NO.:  [id number]   MEDICAL RECORD NO.:  [id number]                   PATIENT TYPE:  AMB   LOCATION:  SDC                                  [facility]AC[facility]   PHYSICIAN:  Miguel Aschoff, M.D.                    DATE OF BIRTH:  [birth year]   DATE OF PROCEDURE:  06/07/2004  DATE OF DISCHARGE:  06/08/2004                                 OPERATIVE REPORT   PREOPERATIVE DIAGNOSIS:  Retained products of conception, status post  therapeutic abortion.   POSTOPERATIVE DIAGNOSIS:  Retained products of conception, status post  therapeutic abortion.   OPERATION PERFORMED:  Suction curettage.   SURGEON:  Miguel Aschoff, M.D.   ANESTHESIA:  Intravenous sedation with paracervical block.   COMPLICATIONS:  None.   INDICATIONS FOR PROCEDURE:  The patient is a 35 year old white female  gravida 2, para 0-1-1-1, who underwent elective termination of pregnancy on  May 30, 2004 at approximately seven weeks gestation.  [facility] complaining of severe  lower abdominal pain and heavy bleeding with passage of clots.  Ultrasound  examination was carried out and there appeared to be a small amount of  retained products of conception present within the confines of the uterus.  Because of her bleeding and pain, she is being taken now to undergo repeat  suction curettage to evacuate the uterus and this will be followed by  antibiotic therapy.   DESCRIPTION OF PROCEDURE:  The patient was taken to the operating room and  placed in the supine position and light sedation was administered without  difficulty.  She was then placed in the dorsal lithotomy position, prepped  and draped in the usual sterile fashion.  Examination was carried out.  This  revealed normal external genitalia.  Normal Bartholin's and Skene's glands.  The vaginal vault had a small amount of blood.  The cervical os was opened.  The cervix  was extremely tender.  The uterus was approximately six weeks  size, again extremely tender.  No adnexal masses were noted.  The cervix was  grasped with a tenaculum and then 18 mL of 1% Xylocaine were placed in the  cervix by injecting the 12, 4 and 8 o'clock positions with 6 mL each.  It  was possible to pass a #8 vacuum curet without any further dilatation of the  cervix.  A small amount of residual tissue was removed without difficulty.  After no further tissue was obtained, sharp curettage was carried out.  Again, only a scant amount of additional tissue was obtained after this.  A  final pass was then made with the suction curet.  At this point with no  other tissues being removed, the procedure was completed.  All instruments  were removed.  Lap counts and instrument counts were taken and found to be  correct.   The patient  was taken to the recovery room.  Plans were for the patient to  be discharged home.   DISCHARGE MEDICATIONS:  1.  Darvocet N100 one every four hours as needed for pain.  2.  Cipro 250 mg twice daily times seven days.   The patient is to call if there are any problems such as fever, pain or  heavy bleeding.  She is instructed to place nothing in the vagina for two  weeks.  She will be seen back in four weeks for follow-up examination.      AR/MEDQ  D:  06/07/2004  T:  06/09/2004  Job:  [id number]

## 2011-02-06 NOTE — H&P (Signed)
NAMERENNE, CORNICK NO.:  [id number]   MEDICAL RECORD NO.:  [id number]          PATIENT TYPE:  AMB   LOCATION:  DAY                           FACILITY:  [facility]   PHYSICIAN:  Lazaro Arms, M.D.   DATE OF BIRTH:  [birth year]   DATE OF ADMISSION:  02/25/2005  DATE OF DISCHARGE:  LH                                HISTORY & PHYSICAL   HISTORY OF PRESENT ILLNESS:  Tricia Brown is a 35 year old white female gravida  1, para 1 status post laparoscopy x2, the first time in [place], [place] and  the second time at [facility] for ovarian cyst,  endometriomas, and endometriosis. The patient has a long history of painful  menstrual periods as well as chronic pelvic pain and dyspareunia. The  patient most recently was seen in the emergency room 2 times in the last  month for that. She has biopsy proven endometriosis as well as a history of  cone biopsy for high grade dysplasia back in 1997. She wants definitive  surgical management. She is chronically on Tylenol with codeine for pain  management. As a result, she is admitted for total abdominal hysterectomy  and bilateral salpingo-oophorectomy.   PAST MEDICAL HISTORY:  Significant only for the GYN problems listed.   PAST SURGICAL HISTORY:  She had a cone biopsy in 1997, laparoscopy in 2000  and 2004 because of ovarian cyst, endometriosis, and endometriomas.   PAST OBSTETRIC HISTORY:  She has had 1 vaginal delivery.   ALLERGIES:  TYLENOL WITH CODEINE.   MEDICATIONS:  Birth control pills.   REVIEW OF SYSTEMS:  Otherwise, negative.   PHYSICAL EXAMINATION:  VITAL SIGNS;  Weight 140 pounds. Blood pressure  120/80.  HEENT:  Unremarkable.  NECK:  Thyroid is normal.  LUNGS:  Clear.  HEART:  Regular rate and rhythm. Without murmur, rub, or gallop.  BREAST:  Without mass or discharge. No skin changes.  ABDOMEN:  Mild pain in both lower quadrants. No rebound.  PELVIC:  Reveals normal external genitalia. Vagina is  pink, moist, no  discharge. Cervix is parous without lesions. Uterus is tender to palpation  and motion. Adnexa tender to palpation. Everything feels normal size.  EXTREMITIES:  Warm with no edema.  NEUROLOGIC:  Examination is grossly intact.   IMPRESSION:  1.  Endometriosis.  2.  History of endometriomas.  3.  Dysmenorrhea.  4.  Dyspareunia.  5.  Menometrorrhagia.   PLAN:  The patient admitted for definitive therapy of endometriosis and  chronic pelvic pain. She is admitted for total abdominal hysterectomy and  bilateral salpingo-oophorectomy. She understands definitive nature of  procedure, the fact that she will never have any more kids and that she will  be on hormone replacement therapy. Depending on the status of her  endometriosis, we may not put her on any estrogen at this point. She is  aware of that and wants to proceed.      LHE/MEDQ  D:  02/24/2005  T:  02/24/2005  Job:  [id number]

## 2011-02-06 NOTE — Op Note (Signed)
NAME:  Tricia Brown, Tricia Brown                     ACCOUNT NO.:  [id number]   MEDICAL RECORD NO.:  [id number]                   PATIENT TYPE:  MAT   LOCATION:  MATC                                 [facility]   PHYSICIAN:  Jamison Neighbor, M.D.               DATE OF BIRTH:  [birth year]   DATE OF PROCEDURE:  03/20/2003  DATE OF DISCHARGE:  03/16/2003                                 OPERATIVE REPORT   PREOPERATIVE DIAGNOSIS:  Pelvic pain, possible interstitial cystitis.   POSTOPERATIVE DIAGNOSIS:  Normal bladder.   PROCEDURE:  Cystoscopy, urethral calibration, hydrodistention of the bladder  and bladder biopsy. Marcaine and Pyridium instillation, Marcaine and Kenalog  pudendal block.   SURGEON:  Jamison Neighbor, M.D.   ANESTHESIA:  General.   COMPLICATIONS:  None.   DRAINS:  None.   BRIEF HISTORY:  This 35 year old female has had pelvic pain of undetermined  etiology. On the basis of her operative evaluation we made a tentative  diagnosis of interstitial cystitis and placed her on a combination of  medications. She is currently taking Elmiron, Neurontin, Bextra, Zyrtec and  Trazodone for the management of her chronic  pelvic pain. The patient is now  to undergo diagnostic laparoscopy to rule out endometriosis and will also  undergo diagnostic cystoscopy and hydrodistention to determine if she has  interstitial cystitis. The patient understands the risks and benefits of the  procedure and gave full and informed consent.   DESCRIPTION OF PROCEDURE:  After the successful induction of general  anesthesia the patient was placed in the dorsal lithotomy position and  prepped with Betadine and draped in the usual sterile manner. The patient  underwent a laparoscopy with lysis of a few small filmy adhesions by Dr.  Marina Gravel. When that procedure was completed, cystoscopy was performed.   On bimanual examination no palpable masses could be seen. The patient's  urethra was noted to  be somewhat small  and this was dilated up to 32  [place]. The cystoscope was inserted. The bladder was carefully inspected. It  was free of any tumor or stones. Both ureteral orifices were normal in  configuration and location.   The bladder was distended to a pressure of 170 mm of water for 5 minutes.  When the bladder was drained, the patient had no glomerulation. There was no  bleeding from the alpha. The bladder capacity was found to be quite high at  1800 cc. Normal for interstitial cystitis patient is 575 and normal for most  patients is 1100. This would strongly suggest that the patient's pain is not  of bladder origin.   The patient underwent a biopsy to look for mast cells. The biopsy site was  cauterized. A mixture of Marcaine and Pyridium was instilled into the  bladder. Marcaine and Kenalog were injected per urethra as well for a  bilateral pudendal nerve block. The patient had a B and O suppository  as  well as interoperative Toradol and Zofran.   The patient will be sent home with Tylox written by Dr. Earlene Plater. She will also  be given Pyridium Plus for postoperative symptom management  and will be  given 3 day coverage with Levaquin.                                               Jamison Neighbor, M.D.    RJE/MEDQ  D:  03/20/2003  T:  03/20/2003  Job:  [id number]   cc:   Gerri Spore B. Earlene Plater, M.D.  [address]  [place]  [place] [address]  Fax: [phone]

## 2011-02-06 NOTE — Op Note (Signed)
NAME:  Tricia Brown, Tricia Brown                     ACCOUNT NO.:  [id number]   MEDICAL RECORD NO.:  [id number]                   PATIENT TYPE:  MAT   LOCATION:  MATC                                 [facility]   PHYSICIAN:  Gerri Spore B. Earlene Plater, M.D.               DATE OF BIRTH:  April 16, 1976   DATE OF PROCEDURE:  03/20/2003  DATE OF DISCHARGE:  03/16/2003                                 OPERATIVE REPORT   PREOPERATIVE DIAGNOSIS:  Pelvic pain, history of endometriosis.   POSTOPERATIVE DIAGNOSIS:  Pelvic pain, history of endometriosis.   PROCEDURE:  Open laparoscopy, lysis of adhesions.   SURGEON:  Chester Holstein. Earlene Plater, M.D.   ANESTHESIA:  General.   FINDINGS:  Filmy adhesions from the small bowel to the left gutter taken  down sharply. Normal appearing uterus, tubes and ovaries. No evidence of  residual endometriosis. Slight scarring along the left uterosacral ligament  consistent with old endometriosis. This was treated. Normal appearing liver  edge, gallbladder  and stomach.   SPECIMENS:  None.   COMPLICATIONS:  None.   INDICATIONS FOR PROCEDURE:  The patient with a history of  endometriosis and  pelvic pain, dysmenorrhea. Also recently diagnosed with interstitial  cystitis. She has been on Depo-Lupron, but has not had improvement in pain.  She presents for surgical diagnosis  and potential treatment of  endometriosis and subsequently cystoscopy with hydrodistention with Dr.  Logan Bores.   DESCRIPTION OF PROCEDURE:  The patient was taken to the operating room and  general anesthesia was obtained. She was placed in the ski position and  prepped and draped in the standard fashion. A red rubber catheter was used  to drain the bladder. Exam under anesthesia showed an anteverted, normal  sized uterus with no adnexal masses. The Hulka tenaculum was attached to the  anterior lip of the cervix.   A vertical infraumbilical skin fold incision was made with a knife. The  fascia was divided  sharply and elevated. The posterior sheath and peritoneum  were divided sharply. A pursestring suture of 0 Vicryl was placed around the  fascial defect. A Hasson cannula was inserted and secured.   Pneumoperitoneum was obtained with CO2 gas. The Trendelenburg position was  obtained. Then 5-mm ports were placed in each lower abdominal quadrant under  direct laparoscopic visualization. The pelvis and abdomen were inspected  with the above findings noted.   There were some filmy adhesions encountered from the small bowel to the left  gutter. These were placed on traction and divided sharply. No further  abnormalities were noted. Therefore, my portion of the procedure was  terminated. The lower ports were removed and their sites were inspected  laparoscopically. They were hemostatic.   The scope was removed, gas released and the Hasson cannula was removed. I  inserted my index finger  through the fascial defect and tied down the  previously placed pursestring suture. This obliterated the fascial defect  and no intractable-abdominal contents were noted prior to closure. The skin  was closed at each site with 4-0 Vicryl.   The patient tolerated the procedure well and there were no complications.  Dr. Logan Bores will next perform cystoscopy. Please see his dictated note for  details.                                                 Gerri Spore B. Earlene Plater, M.D.    WBD/MEDQ  D:  03/20/2003  T:  03/20/2003  Job:  [id number]   cc:   Jamison Neighbor, M.D.  [address]  [place]  [place] [address]  Fax: [phone]

## 2011-03-16 ENCOUNTER — Emergency Department (HOSPITAL_COMMUNITY)
Admission: EM | Admit: 2011-03-16 | Discharge: 2011-03-16 | Disposition: A | Discharge status: Home / Self Care | Payer: Commercial Managed Care - PPO | Attending: Emergency Medicine | Admitting: Emergency Medicine

## 2011-03-16 DIAGNOSIS — M545 Low back pain, unspecified: Secondary | ICD-10-CM | POA: Insufficient documentation

## 2011-03-16 DIAGNOSIS — M549 Dorsalgia, unspecified: Secondary | ICD-10-CM | POA: Insufficient documentation

## 2011-03-16 LAB — URINALYSIS, ROUTINE W REFLEX MICROSCOPIC
Nitrite: NEGATIVE
Protein, ur: NEGATIVE mg/dL
Specific Gravity, Urine: 1.021 (ref 1.005–1.030)
Urobilinogen, UA: 0.2 mg/dL (ref 0.0–1.0)

## 2011-03-16 LAB — URINE MICROSCOPIC-ADD ON

## 2011-04-08 ENCOUNTER — Encounter: Payer: Self-pay | Admitting: Family Medicine

## 2011-04-08 DIAGNOSIS — F329 Major depressive disorder, single episode, unspecified: Secondary | ICD-10-CM | POA: Insufficient documentation

## 2011-04-08 DIAGNOSIS — K589 Irritable bowel syndrome without diarrhea: Secondary | ICD-10-CM | POA: Insufficient documentation

## 2011-04-08 DIAGNOSIS — N809 Endometriosis, unspecified: Secondary | ICD-10-CM | POA: Insufficient documentation

## 2011-04-08 DIAGNOSIS — IMO0001 Reserved for inherently not codable concepts without codable children: Secondary | ICD-10-CM | POA: Insufficient documentation

## 2011-06-05 ENCOUNTER — Emergency Department: Payer: Self-pay | Admitting: Emergency Medicine

## 2011-06-06 ENCOUNTER — Inpatient Hospital Stay: Payer: Self-pay | Admitting: *Deleted

## 2011-06-11 LAB — DIFFERENTIAL
Basophils Absolute: 0
Basophils Relative: 1
Lymphocytes Relative: 19
Monocytes Relative: 6
Neutro Abs: 4.7
Neutrophils Relative %: 72

## 2011-06-11 LAB — CBC
HCT: 41.3
Hemoglobin: 14.4
MCHC: 34.8
MCV: 89.8
RDW: 12

## 2011-06-11 LAB — URINALYSIS, ROUTINE W REFLEX MICROSCOPIC
Bilirubin Urine: NEGATIVE
Glucose, UA: NEGATIVE
Hgb urine dipstick: NEGATIVE
Ketones, ur: NEGATIVE
Protein, ur: NEGATIVE
pH: 5.5

## 2011-06-11 LAB — COMPREHENSIVE METABOLIC PANEL
Alkaline Phosphatase: 115
BUN: 7
Creatinine, Ser: 0.89
Glucose, Bld: 95
Potassium: 4.4
Total Bilirubin: 0.9
Total Protein: 7.1

## 2011-06-18 LAB — POCT URINALYSIS DIP (DEVICE)
Glucose, UA: NEGATIVE
Operator id: 247071
Protein, ur: NEGATIVE
pH: 7

## 2011-06-18 LAB — POCT I-STAT, CHEM 8
Chloride: 106
HCT: 40
Hemoglobin: 13.6
Potassium: 4.1
Sodium: 140

## 2011-06-18 LAB — URINE CULTURE
Colony Count: NO GROWTH
Culture: NO GROWTH

## 2011-06-19 LAB — COMPREHENSIVE METABOLIC PANEL
Alkaline Phosphatase: 71
BUN: 3 — ABNORMAL LOW
Glucose, Bld: 83
Potassium: 3.4 — ABNORMAL LOW
Total Bilirubin: 0.8
Total Protein: 6.1

## 2011-06-19 LAB — URINALYSIS, ROUTINE W REFLEX MICROSCOPIC
Bilirubin Urine: NEGATIVE
Nitrite: NEGATIVE
Specific Gravity, Urine: 1.004 — ABNORMAL LOW
Urobilinogen, UA: 0.2

## 2011-06-19 LAB — URINE CULTURE

## 2011-06-19 LAB — PROTIME-INR
INR: 1
Prothrombin Time: 13.2

## 2011-06-19 LAB — CBC
HCT: 37.9
Hemoglobin: 12.7
MCV: 93.3
RDW: 12.1

## 2011-06-19 LAB — DIFFERENTIAL
Basophils Absolute: 0
Basophils Relative: 1
Neutro Abs: 4.8
Neutrophils Relative %: 62

## 2011-06-27 ENCOUNTER — Emergency Department: Payer: Self-pay | Admitting: Emergency Medicine

## 2011-06-29 LAB — COMPREHENSIVE METABOLIC PANEL
Albumin: 4.1
Alkaline Phosphatase: 90
BUN: 7
BUN: 9
CO2: 25
Chloride: 100
Chloride: 105
Creatinine, Ser: 0.79
Creatinine, Ser: 0.81
GFR calc non Af Amer: >60
Potassium: 3.9
Total Bilirubin: 0.9
Total Bilirubin: 1.2
Total Protein: 7

## 2011-06-29 LAB — CBC
HCT: 36
HCT: 37.5
HCT: 44
Hemoglobin: 12.5
Hemoglobin: 13.1
MCV: 90.4
MCV: 90.6
Platelets: 287
RBC: 3.98
RBC: 4.14
RDW: 12.1
RDW: 12.5
WBC: 6.5
WBC: 7.2

## 2011-06-29 LAB — URINALYSIS, ROUTINE W REFLEX MICROSCOPIC
Glucose, UA: NEGATIVE
Hgb urine dipstick: NEGATIVE
Ketones, ur: NEGATIVE
Protein, ur: NEGATIVE

## 2011-06-29 LAB — BASIC METABOLIC PANEL
GFR calc Af Amer: >60
GFR calc non Af Amer: >60
Glucose, Bld: 80
Potassium: 3.9
Sodium: 138

## 2011-06-29 LAB — PHOSPHORUS: Phosphorus: 4

## 2011-06-29 LAB — DIFFERENTIAL
Basophils Absolute: 0
Lymphocytes Relative: 20
Monocytes Absolute: 0.6
Neutro Abs: 6
Neutrophils Relative %: 71

## 2011-06-30 LAB — COMPREHENSIVE METABOLIC PANEL
Albumin: 4.3
BUN: 8
Chloride: 98
Creatinine, Ser: 0.74
GFR calc non Af Amer: >60
Glucose, Bld: 91
Total Bilirubin: 1.2

## 2011-06-30 LAB — URINALYSIS, ROUTINE W REFLEX MICROSCOPIC
Nitrite: NEGATIVE
Specific Gravity, Urine: 1.006
Urobilinogen, UA: 0.2
pH: 6.5

## 2011-06-30 LAB — CBC
HCT: 41
Hemoglobin: 14.6
MCV: 89.3
Platelets: 278
RDW: 12.3
WBC: 8.4

## 2011-06-30 LAB — DIFFERENTIAL
Basophils Absolute: 0
Lymphocytes Relative: 29
Neutro Abs: 5.1
Neutrophils Relative %: 61

## 2011-06-30 LAB — GC/CHLAMYDIA PROBE AMP, GENITAL: Chlamydia, DNA Probe: NEGATIVE

## 2011-06-30 LAB — WET PREP, GENITAL: Yeast Wet Prep HPF POC: NONE SEEN

## 2011-08-07 ENCOUNTER — Other Ambulatory Visit: Payer: Self-pay | Admitting: Gastroenterology

## 2011-08-18 ENCOUNTER — Other Ambulatory Visit: Payer: Self-pay | Admitting: Urology

## 2011-09-04 ENCOUNTER — Encounter (HOSPITAL_BASED_OUTPATIENT_CLINIC_OR_DEPARTMENT_OTHER): Payer: Self-pay | Admitting: *Deleted

## 2011-09-04 NOTE — Progress Notes (Signed)
NPO AFTER MN. PT ARRIVES AT 0915. NEEDS HG. WILL TAKE PRILOSEC AM OF SURG. W/ SIP OF WATER, IF NEEDED CAN TAKE PERCOCET

## 2011-09-08 ENCOUNTER — Ambulatory Visit (HOSPITAL_BASED_OUTPATIENT_CLINIC_OR_DEPARTMENT_OTHER): Payer: Commercial Managed Care - PPO | Admitting: Anesthesiology

## 2011-09-08 ENCOUNTER — Encounter (HOSPITAL_BASED_OUTPATIENT_CLINIC_OR_DEPARTMENT_OTHER): Payer: Self-pay | Admitting: Anesthesiology

## 2011-09-08 ENCOUNTER — Encounter (HOSPITAL_BASED_OUTPATIENT_CLINIC_OR_DEPARTMENT_OTHER): Payer: Self-pay | Admitting: *Deleted

## 2011-09-08 ENCOUNTER — Encounter (HOSPITAL_BASED_OUTPATIENT_CLINIC_OR_DEPARTMENT_OTHER)
Admission: RE | Disposition: A | Discharge status: Home / Self Care | Payer: Self-pay | Source: Ambulatory Visit | Attending: Urology

## 2011-09-08 ENCOUNTER — Ambulatory Visit (HOSPITAL_BASED_OUTPATIENT_CLINIC_OR_DEPARTMENT_OTHER)
Admission: RE | Admit: 2011-09-08 | Discharge: 2011-09-08 | Disposition: A | Discharge status: Home / Self Care | Payer: Commercial Managed Care - PPO | Source: Ambulatory Visit | Attending: Urology | Admitting: Urology

## 2011-09-08 DIAGNOSIS — N3020 Other chronic cystitis without hematuria: Secondary | ICD-10-CM | POA: Insufficient documentation

## 2011-09-08 DIAGNOSIS — Z8543 Personal history of malignant neoplasm of ovary: Secondary | ICD-10-CM | POA: Insufficient documentation

## 2011-09-08 DIAGNOSIS — R102 Pelvic and perineal pain: Secondary | ICD-10-CM

## 2011-09-08 DIAGNOSIS — R350 Frequency of micturition: Secondary | ICD-10-CM | POA: Insufficient documentation

## 2011-09-08 DIAGNOSIS — K589 Irritable bowel syndrome without diarrhea: Secondary | ICD-10-CM | POA: Insufficient documentation

## 2011-09-08 DIAGNOSIS — N949 Unspecified condition associated with female genital organs and menstrual cycle: Secondary | ICD-10-CM | POA: Insufficient documentation

## 2011-09-08 DIAGNOSIS — K219 Gastro-esophageal reflux disease without esophagitis: Secondary | ICD-10-CM | POA: Insufficient documentation

## 2011-09-08 DIAGNOSIS — M549 Dorsalgia, unspecified: Secondary | ICD-10-CM | POA: Insufficient documentation

## 2011-09-08 DIAGNOSIS — R109 Unspecified abdominal pain: Secondary | ICD-10-CM | POA: Insufficient documentation

## 2011-09-08 DIAGNOSIS — Z79899 Other long term (current) drug therapy: Secondary | ICD-10-CM | POA: Insufficient documentation

## 2011-09-08 HISTORY — PX: CYSTO WITH HYDRODISTENSION: SHX5453

## 2011-09-08 HISTORY — DX: Frequency of micturition: R35.0

## 2011-09-08 HISTORY — DX: Urgency of urination: R39.15

## 2011-09-08 HISTORY — DX: Personal history of cervical dysplasia: Z87.410

## 2011-09-08 HISTORY — DX: Personal history of other diseases of the digestive system: Z87.19

## 2011-09-08 HISTORY — DX: Other symptoms and signs involving the genitourinary system: R39.89

## 2011-09-08 HISTORY — DX: Nocturia: R35.1

## 2011-09-08 HISTORY — DX: Personal history of other diseases of the female genital tract: Z87.42

## 2011-09-08 HISTORY — DX: Dorsalgia, unspecified: M54.9

## 2011-09-08 HISTORY — DX: Other chronic pain: G89.29

## 2011-09-08 LAB — POCT HEMOGLOBIN-HEMACUE: Hemoglobin: 14 g/dL (ref 12.0–15.0)

## 2011-09-08 SURGERY — CYSTOSCOPY, WITH BLADDER HYDRODISTENSION
Anesthesia: General | Site: Bladder | Wound class: Clean Contaminated

## 2011-09-08 MED ORDER — OXYCODONE-ACETAMINOPHEN 10-650 MG PO TABS: 1.0000 | ORAL_TABLET | Freq: Four times a day (QID) | ORAL | Status: AC | PRN

## 2011-09-08 MED ORDER — CIPROFLOXACIN IN D5W 400 MG/200ML IV SOLN
400.0000 mg | INTRAVENOUS | Status: AC
  Administered 2011-09-08: 400 mg via INTRAVENOUS

## 2011-09-08 MED ORDER — MIDAZOLAM HCL 5 MG/5ML IJ SOLN
INTRAMUSCULAR | Status: DC | PRN
  Administered 2011-09-08: 2 mg via INTRAVENOUS

## 2011-09-08 MED ORDER — LACTATED RINGERS IV SOLN
INTRAVENOUS | Status: DC
  Administered 2011-09-08: 10:00:00 via INTRAVENOUS

## 2011-09-08 MED ORDER — KETOROLAC TROMETHAMINE 30 MG/ML IJ SOLN
INTRAMUSCULAR | Status: DC | PRN
  Administered 2011-09-08: 30 mg via INTRAVENOUS

## 2011-09-08 MED ORDER — FENTANYL CITRATE 0.05 MG/ML IJ SOLN: 25.0000 ug | INTRAMUSCULAR | Status: DC | PRN

## 2011-09-08 MED ORDER — LIDOCAINE HCL (CARDIAC) 20 MG/ML IV SOLN
INTRAVENOUS | Status: DC | PRN
  Administered 2011-09-08: 100 mg via INTRAVENOUS

## 2011-09-08 MED ORDER — FENTANYL CITRATE 0.05 MG/ML IJ SOLN
50.0000 ug | INTRAMUSCULAR | Status: DC | PRN
  Administered 2011-09-08: 100 ug via INTRAVENOUS

## 2011-09-08 MED ORDER — DEXAMETHASONE SODIUM PHOSPHATE 4 MG/ML IJ SOLN
INTRAMUSCULAR | Status: DC | PRN
  Administered 2011-09-08: 10 mg via INTRAVENOUS

## 2011-09-08 MED ORDER — ONDANSETRON HCL 4 MG/2ML IJ SOLN
INTRAMUSCULAR | Status: DC | PRN
  Administered 2011-09-08: 4 mg via INTRAVENOUS

## 2011-09-08 MED ORDER — PHENAZOPYRIDINE HCL 200 MG PO TABS
ORAL | Status: DC | PRN
  Administered 2011-09-08: 11:00:00 via INTRAVESICAL

## 2011-09-08 MED ORDER — MEPERIDINE HCL 25 MG/ML IJ SOLN: 6.2500 mg | INTRAMUSCULAR | Status: DC | PRN

## 2011-09-08 MED ORDER — OXYCODONE-ACETAMINOPHEN 5-325 MG PO TABS
1.0000 | ORAL_TABLET | Freq: Four times a day (QID) | ORAL | Status: DC | PRN
  Administered 2011-09-08 (×2): 1 via ORAL

## 2011-09-08 MED ORDER — PROPOFOL 10 MG/ML IV EMUL
INTRAVENOUS | Status: DC | PRN
  Administered 2011-09-08: 200 mg via INTRAVENOUS

## 2011-09-08 MED ORDER — FENTANYL CITRATE 0.05 MG/ML IJ SOLN
INTRAMUSCULAR | Status: DC | PRN
  Administered 2011-09-08 (×2): 50 ug via INTRAVENOUS
  Administered 2011-09-08: 100 ug via INTRAVENOUS
  Administered 2011-09-08: 25 ug

## 2011-09-08 MED ORDER — LACTATED RINGERS IV SOLN: INTRAVENOUS | Status: DC

## 2011-09-08 MED ORDER — PROMETHAZINE HCL 25 MG/ML IJ SOLN: 6.2500 mg | INTRAMUSCULAR | Status: DC | PRN

## 2011-09-08 MED ORDER — CIPROFLOXACIN HCL 250 MG PO TABS: 250.0000 mg | ORAL_TABLET | Freq: Two times a day (BID) | ORAL | Status: AC

## 2011-09-08 SURGICAL SUPPLY — 21 items
BAG DRAIN URO-CYSTO SKYTR STRL (DRAIN) ×2 IMPLANT
CANISTER SUCT LVC 12 LTR MEDI- (MISCELLANEOUS) ×2 IMPLANT
CATH ROBINSON RED A/P 14FR (CATHETERS) ×2 IMPLANT
CLOTH BEACON ORANGE TIMEOUT ST (SAFETY) ×2 IMPLANT
DRAPE CAMERA CLOSED 9X96 (DRAPES) ×2 IMPLANT
GLOVE BIO SURGEON STRL SZ7.5 (GLOVE) ×2 IMPLANT
GLOVE BIOGEL PI IND STRL 6.5 (GLOVE) ×1 IMPLANT
GLOVE BIOGEL PI IND STRL 7.0 (GLOVE) ×1 IMPLANT
GLOVE BIOGEL PI INDICATOR 6.5 (GLOVE) ×1
GLOVE BIOGEL PI INDICATOR 7.0 (GLOVE) ×1
GLOVE ECLIPSE 6.0 STRL STRAW (GLOVE) ×2 IMPLANT
GLOVE SKINSENSE NS SZ7.0 (GLOVE) ×1
GLOVE SKINSENSE STRL SZ7.0 (GLOVE) ×1 IMPLANT
GOWN PREVENTION PLUS LG XLONG (DISPOSABLE) ×4 IMPLANT
GOWN STRL REIN XL XLG (GOWN DISPOSABLE) ×2 IMPLANT
NDL SAFETY ECLIPSE 18X1.5 (NEEDLE) ×1 IMPLANT
NEEDLE HYPO 18GX1.5 SHARP (NEEDLE) ×1
PACK CYSTOSCOPY (CUSTOM PROCEDURE TRAY) ×2 IMPLANT
SYR 20CC LL (SYRINGE) ×2 IMPLANT
WATER STERILE IRR 1000ML UROMA (IV SOLUTION) ×2 IMPLANT
WATER STERILE IRR 3000ML UROMA (IV SOLUTION) ×2 IMPLANT

## 2011-09-08 NOTE — Op Note (Signed)
Preoperative diagnosis: Pelvic pain Postoperative diagnosis: Pelvic pain Surgery: Cystoscopy bladder hydrodistention Surgeon Dr. Lorin Picket Reid Nawrot  [name] has nonspecific back pain suprapubic pain and vaginal pain.  She consented to the above procedure  20 once [place] scope was utilized. Bladder mucosa and trigone were normal. There is no stitch foreign body or carcinoma. She was hydrodistended to 850 mL  On reinspection the bladder there was no injury to the bladder. The trigone was normal. There were no glomerulations or findings in keeping with the diagnosis of interstitial cystitis  The bladder was emptied. A separate procedure I instilled 15 cc of 0.5% Marcaine +400 mg appropriate  I will not diagnosis Tricia Brown with interstitial cystitis based upon this test. I will recommend to treatments as a therapeutic and diagnostic [place] post operative with

## 2011-09-08 NOTE — H&P (Signed)
History of Present Illness   I dictated a long note on Tricia Brown in the past. Years ago Dr. Logan Bores did not think she had interstitial cystitis. She had a CT scan October 2011 and it was normal. When I saw her on that day, I felt that her ill-defined back pain was chronic and not related to her kidneys and she had been cleared for microscopic hematuria. Based upon my note, she was not having a lot of abdominal pain at that time. She had been given VESIcare in the past. She has had negative urine cultures. Having said that, on November 12 she had a positive culture and was given Septra followed by daily trimethoprim by Lebron Quam on August 03, 2011.   She was here today to rebaseline her symptoms. She says her symptoms are improved but they are still significant. She says she has suprapubic and vaginal discomfort with burning when she urinates. She has nondescript back pain that is bilateral. She says she gets cramping and suprapubic bladder spasms. She says intercourse is very painful. She is voiding every 2-3 hours with urgency and then she cannot go. The symptoms are moderate in severity and persistent in spite of taking Septra followed by trimethoprim daily at nighttime. There is no other modifying factors or associated signs or symptoms. There is no other aggravating or relieving factors. She was given 35 Percocet on her last visit.  Review of systems: No other change in bowel or neurologic status.   Today she underwent a number of tests which I personally reviewed. Bladder scan: Bladder scan residual was 87 mL.  I am suspect that [name] may have interstitial cystitis. I talked to her about a hydrodistension.  We talked about cystoscopy/hydrodistension and instillation in detail. Pros, cons, general surgical and anesthetic risks, and other options including watchful waiting were discussed. Risks were described but not limited to pain, infection, and bleeding. The risk of bladder perforation  and management were discussed. The patient understands that it is primarily a diagnostic procedure.   I also talked to her about that I think it would be prudent to do a CT scan to make certain there is not other pathology, especially with her nausea and vomiting as well.       Past Medical History Problems  1. History of  Anxiety (Symptom) 300.00 2. History of  Endometriosis 617.9 3. History of  Esophageal Reflux 530.81 4. History of  Irritable Bowel Syndrome 564.1 5. History of  Ovarian Cancer V10.43  Surgical History Problems  1. History of  Hysterectomy V45.77 2. History of  Laparoscop Excis Endometriotic Tissue Uterosacral Ligaments  Current Meds 1. Estrace 2 MG Oral Tablet; Therapy: (Recorded:08Nov2012) to 2. Multivitamin/Iron TABS; Therapy: (Recorded:22Apr2009) to 3. Neurontin 300 MG Oral Capsule; Therapy: (Recorded:22Apr2009) to 4. Oxycodone-Acetaminophen 5-325 MG Oral Tablet; TAKE 1 TO 2 TABLETS EVERY 4 TO 6 HOURS  AS NEEDED FOR PAIN; Therapy: 08Nov2012 to (Evaluate:19Nov2012); Last Rx:16Nov2012 5. Sulfamethoxazole-TMP DS 800-160 MG Oral Tablet; TAKE 1 TABLET TWICE DAILY WITH FOOD;  Therapy: 08Nov2012 to (Evaluate:18Nov2012)  Requested for: 08Nov2012; Last Rx:08Nov2012 6. Trimethoprim 100 MG Oral Tablet; TAKE 1 TABLET Bedtime for prevention of UTI; Therapy:  12Nov2012 to (Last Rx:12Nov2012)  Requested for: 12Nov2012 7. Uribel 118 MG Oral Capsule; TAKE 1 CAPSULE 4 times daily PRN; Therapy: 08Nov2012 to (Last  Rx:08Nov2012)  Allergies Medication  1. No Known Drug Allergies  Family History Problems  1. Maternal history of  Diabetes Mellitus V18.0 2. Family history of  Family  Health Status Number Of Children 1 son 3. Maternal history of  Glaucoma 4. Maternal history of  Nephrolithiasis 5. Paternal history of  Nephrolithiasis 6. Fraternal history of  Nephrolithiasis  Social History Problems  1. Caffeine Use 2 drinks daily 2. Marital History - Divorced V61.03 3.  Occupation: respiratory therapy 4. Tobacco Use V15.82 1 pack dailysmoking for 15 years Denied  5. History of  Alcohol Use  Vitals Vital Signs [Data Includes: Last 1 Day]  26Nov2012 10:37AM  Blood Pressure: 124 / 56 Temperature: 98.1 F Heart Rate: 106  Assessment Assessed  1. Chronic Cystitis 595.2 2. Urinary Frequency 788.41 3. Abdominal Pain 789.00  Plan Pyuria (791.9)  1. Oxycodone-Acetaminophen 5-325 MG Oral Tablet; TAKE 1 TO 2 TABLETS EVERY 4 TO 6 HOURS  AS NEEDED FOR PAIN; Therapy: 08Nov2012 to (Evaluate:30Nov2012); Last Rx:26Nov2012  Discussion/Summary   It is important to clarify that [name] has had a CT Scan which I had noted in the past. It showed a potential cecal mass. She had a colonoscopy last week and biopsy results are pending, but she says visually it was within normal limits.  She would like to proceed with a hydrodistension and we will proceed accordingly. Forty Percocet were given.  After a thorough review of the management options for the patient's condition the patient  elected to proceed with surgical therapy as noted above. We have discussed the potential benefits and risks of the procedure, side effects of the proposed treatment, the likelihood of the patient achieving the goals of the procedure, and any potential problems that might occur during the procedure or recuperation. Informed consent has been obtained.

## 2011-09-08 NOTE — Anesthesia Preprocedure Evaluation (Signed)
Anesthesia Evaluation    Airway Mallampati: I TM Distance: >3 FB Neck ROM: Full    Dental No notable dental hx.    Pulmonary Current Smoker,  clear to auscultation  Pulmonary exam normal       Cardiovascular Regular Normal    Neuro/Psych  Headaches, PSYCHIATRIC DISORDERS Depression    GI/Hepatic   Endo/Other    Renal/GU      Musculoskeletal   Abdominal   Peds  Hematology   Anesthesia Other Findings   Reproductive/Obstetrics                           Anesthesia Physical Anesthesia Plan  ASA: II  Anesthesia Plan: General   Post-op Pain Management:    Induction: Intravenous  Airway Management Planned: LMA  Additional Equipment:   Intra-op Plan:   Post-operative Plan: Extubation in OR  Informed Consent: I have reviewed the patients History and Physical, chart, labs and discussed the procedure including the risks, benefits and alternatives for the proposed anesthesia with the patient or authorized representative who has indicated his/her understanding and acceptance.   Dental advisory given  Plan Discussed with: CRNA  Anesthesia Plan Comments:         Anesthesia Quick Evaluation

## 2011-09-08 NOTE — Transfer of Care (Signed)
Immediate Anesthesia Transfer of Care Note  Patient: Tricia Brown  Procedure(s) Performed:  CYSTOSCOPY/HYDRODISTENSION - Cysto/HOD Instillation of Marcaine & Pyridium  Patient Location: PACU  Anesthesia Type: General  Level of Consciousness: awake, alert  and oriented  Airway & Oxygen Therapy: Patient Spontanous Breathing and Patient connected to nasal cannula oxygen  Post-op Assessment: Report given to PACU RN  Post vital signs: Reviewed and stable  Complications: No apparent anesthesia complications

## 2011-09-08 NOTE — Anesthesia Procedure Notes (Signed)
Procedure Name: LMA Insertion Date/Time: 09/08/2011 10:59 AM Performed by: Huel Coventry Pre-anesthesia Checklist: Patient identified, Emergency Drugs available, Suction available and Patient being monitored Patient Re-evaluated:Patient Re-evaluated prior to inductionOxygen Delivery Method: Circle System Utilized Preoxygenation: Pre-oxygenation with 100% oxygen Intubation Type: IV induction Ventilation: Mask ventilation without difficulty LMA: LMA inserted LMA Size: 4.0 Number of attempts: 1 Airway Equipment and Method: bite block Placement Confirmation: positive ETCO2 Dental Injury: Teeth and Oropharynx as per pre-operative assessment

## 2011-09-08 NOTE — Anesthesia Postprocedure Evaluation (Signed)
  Anesthesia Post-op Note  Patient: Tricia Brown  Procedure(s) Performed:  CYSTOSCOPY/HYDRODISTENSION - Cysto/HOD Instillation of Marcaine & Pyridium  Patient Location: PACU  Anesthesia Type: General  Level of Consciousness: awake and alert   Airway and Oxygen Therapy: Patient Spontanous Breathing  Post-op Pain: mild  Post-op Assessment: Post-op Vital signs reviewed, Patient's Cardiovascular Status Stable, Respiratory Function Stable, Patent Airway and No signs of Nausea or vomiting  Post-op Vital Signs: stable  Complications: No apparent anesthesia complications

## 2011-09-08 NOTE — Interval H&P Note (Signed)
History and Physical Interval Note:  09/08/2011 7:20 AM  Tricia Brown  has presented today for surgery, with the diagnosis of Pelvic Pain  The various methods of treatment have been discussed with the patient and family. After consideration of risks, benefits and other options for treatment, the patient has consented to  Procedure(s): CYSTOSCOPY/HYDRODISTENSION as a surgical intervention .  The patients' history has been reviewed, patient examined, no change in status, stable for surgery.  I have reviewed the patients' chart and labs.  Questions were answered to the patient's satisfaction.     Seidy Labreck A

## 2011-09-10 ENCOUNTER — Encounter (HOSPITAL_BASED_OUTPATIENT_CLINIC_OR_DEPARTMENT_OTHER): Payer: Self-pay | Admitting: Urology

## 2011-10-12 ENCOUNTER — Other Ambulatory Visit: Payer: Self-pay | Admitting: Obstetrics & Gynecology

## 2011-11-24 ENCOUNTER — Emergency Department: Payer: Self-pay | Admitting: Emergency Medicine

## 2012-05-03 ENCOUNTER — Emergency Department: Payer: Self-pay | Admitting: Emergency Medicine

## 2012-05-03 LAB — CBC
MCV: 94 fL (ref 80–100)
RBC: 4.43 10*6/uL (ref 3.80–5.20)
RDW: 12.4 % (ref 11.5–14.5)
WBC: 8.8 10*3/uL (ref 3.6–11.0)

## 2012-05-03 LAB — URINALYSIS, COMPLETE
Bilirubin,UR: NEGATIVE
Nitrite: POSITIVE
Protein: NEGATIVE
RBC,UR: 19 /HPF (ref 0–5)
Specific Gravity: 1.026 (ref 1.003–1.030)
WBC UR: 98 /HPF (ref 0–5)

## 2012-05-03 LAB — BASIC METABOLIC PANEL
Anion Gap: 7 (ref 7–16)
BUN: 7 mg/dL (ref 7–18)
Calcium, Total: 8.8 mg/dL (ref 8.5–10.1)
Creatinine: 1.07 mg/dL (ref 0.60–1.30)
EGFR (African American): >60
EGFR (Non-African Amer.): >60
Glucose: 103 mg/dL — ABNORMAL HIGH (ref 65–99)
Osmolality: 274 (ref 275–301)
Potassium: 3.6 mmol/L (ref 3.5–5.1)
Sodium: 138 mmol/L (ref 136–145)

## 2012-05-05 LAB — URINE CULTURE

## 2012-06-27 ENCOUNTER — Emergency Department: Payer: Self-pay | Admitting: Emergency Medicine

## 2012-06-27 LAB — URINALYSIS, COMPLETE
Bilirubin,UR: NEGATIVE
Glucose,UR: NEGATIVE mg/dL (ref 0–75)
Leukocyte Esterase: NEGATIVE
Nitrite: NEGATIVE
Ph: 6 (ref 4.5–8.0)
Protein: NEGATIVE
RBC,UR: <1 /HPF (ref 0–5)
Specific Gravity: 1.002 (ref 1.003–1.030)
Squamous Epithelial: 1

## 2012-06-28 LAB — COMPREHENSIVE METABOLIC PANEL
Alkaline Phosphatase: 178 U/L — ABNORMAL HIGH (ref 50–136)
Calcium, Total: 8.9 mg/dL (ref 8.5–10.1)
Co2: 32 mmol/L (ref 21–32)
EGFR (Non-African Amer.): >60
Osmolality: 282 (ref 275–301)
SGOT(AST): 31 U/L (ref 15–37)
SGPT (ALT): 34 U/L (ref 12–78)
Total Protein: 7.2 g/dL (ref 6.4–8.2)

## 2012-06-28 LAB — CBC WITH DIFFERENTIAL/PLATELET
Basophil #: 0.1 10*3/uL (ref 0.0–0.1)
Eosinophil %: 2.5 %
HGB: 13.8 g/dL (ref 12.0–16.0)
Lymphocyte #: 3.6 10*3/uL (ref 1.0–3.6)
Lymphocyte %: 35.5 %
MCH: 32.6 pg (ref 26.0–34.0)
MCV: 95 fL (ref 80–100)
Monocyte #: 0.6 x10 3/mm (ref 0.2–0.9)
Monocyte %: 6 %
Platelet: 301 10*3/uL (ref 150–440)
WBC: 10 10*3/uL (ref 3.6–11.0)

## 2012-06-28 LAB — LIPASE, BLOOD: Lipase: 128 U/L (ref 73–393)

## 2012-10-23 ENCOUNTER — Emergency Department (HOSPITAL_COMMUNITY)
Admission: EM | Admit: 2012-10-23 | Discharge: 2012-10-23 | Disposition: A | Discharge status: Home / Self Care | Payer: Self-pay | Source: Home / Self Care | Attending: Emergency Medicine | Admitting: Emergency Medicine

## 2012-10-23 ENCOUNTER — Encounter (HOSPITAL_COMMUNITY): Payer: Self-pay

## 2012-10-23 DIAGNOSIS — J40 Bronchitis, not specified as acute or chronic: Secondary | ICD-10-CM

## 2012-10-23 MED ORDER — AZITHROMYCIN 250 MG PO TABS: 250.0000 mg | ORAL_TABLET | Freq: Every day | ORAL | Status: DC

## 2012-10-23 NOTE — ED Provider Notes (Signed)
Medical screening examination/treatment/procedure(s) were performed by non-physician practitioner and as supervising physician I was immediately available for consultation/collaboration.  Raynald Blend, MD 10/23/12 1414

## 2012-10-23 NOTE — ED Notes (Signed)
Complains of cough and congestion x 2 weeks
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
## 2012-11-19 ENCOUNTER — Emergency Department (HOSPITAL_COMMUNITY)
Admission: EM | Admit: 2012-11-19 | Discharge: 2012-11-19 | Disposition: A | Discharge status: Home / Self Care | Payer: Medicaid Other | Attending: Emergency Medicine | Admitting: Emergency Medicine

## 2012-11-19 ENCOUNTER — Encounter (HOSPITAL_COMMUNITY): Payer: Self-pay | Admitting: Radiology

## 2012-11-19 ENCOUNTER — Emergency Department (HOSPITAL_COMMUNITY): Payer: Medicaid Other

## 2012-11-19 DIAGNOSIS — Z8614 Personal history of Methicillin resistant Staphylococcus aureus infection: Secondary | ICD-10-CM | POA: Insufficient documentation

## 2012-11-19 DIAGNOSIS — E876 Hypokalemia: Secondary | ICD-10-CM

## 2012-11-19 DIAGNOSIS — F3289 Other specified depressive episodes: Secondary | ICD-10-CM | POA: Insufficient documentation

## 2012-11-19 DIAGNOSIS — Z79899 Other long term (current) drug therapy: Secondary | ICD-10-CM | POA: Insufficient documentation

## 2012-11-19 DIAGNOSIS — R1110 Vomiting, unspecified: Secondary | ICD-10-CM | POA: Insufficient documentation

## 2012-11-19 DIAGNOSIS — Z8742 Personal history of other diseases of the female genital tract: Secondary | ICD-10-CM | POA: Insufficient documentation

## 2012-11-19 DIAGNOSIS — Z8739 Personal history of other diseases of the musculoskeletal system and connective tissue: Secondary | ICD-10-CM | POA: Insufficient documentation

## 2012-11-19 DIAGNOSIS — F329 Major depressive disorder, single episode, unspecified: Secondary | ICD-10-CM | POA: Insufficient documentation

## 2012-11-19 DIAGNOSIS — Z3202 Encounter for pregnancy test, result negative: Secondary | ICD-10-CM | POA: Insufficient documentation

## 2012-11-19 DIAGNOSIS — Z87448 Personal history of other diseases of urinary system: Secondary | ICD-10-CM | POA: Insufficient documentation

## 2012-11-19 DIAGNOSIS — G8929 Other chronic pain: Secondary | ICD-10-CM | POA: Insufficient documentation

## 2012-11-19 DIAGNOSIS — Z8679 Personal history of other diseases of the circulatory system: Secondary | ICD-10-CM | POA: Insufficient documentation

## 2012-11-19 DIAGNOSIS — R197 Diarrhea, unspecified: Secondary | ICD-10-CM | POA: Insufficient documentation

## 2012-11-19 DIAGNOSIS — R509 Fever, unspecified: Secondary | ICD-10-CM | POA: Insufficient documentation

## 2012-11-19 DIAGNOSIS — Z8719 Personal history of other diseases of the digestive system: Secondary | ICD-10-CM | POA: Insufficient documentation

## 2012-11-19 DIAGNOSIS — F17200 Nicotine dependence, unspecified, uncomplicated: Secondary | ICD-10-CM | POA: Insufficient documentation

## 2012-11-19 DIAGNOSIS — Z90710 Acquired absence of both cervix and uterus: Secondary | ICD-10-CM | POA: Insufficient documentation

## 2012-11-19 DIAGNOSIS — R109 Unspecified abdominal pain: Secondary | ICD-10-CM | POA: Insufficient documentation

## 2012-11-19 LAB — COMPREHENSIVE METABOLIC PANEL
AST: 12 U/L (ref 0–37)
Albumin: 3.4 g/dL — ABNORMAL LOW (ref 3.5–5.2)
Alkaline Phosphatase: 112 U/L (ref 39–117)
BUN: 5 mg/dL — ABNORMAL LOW (ref 6–23)
CO2: 24 mEq/L (ref 19–32)
Chloride: 102 mEq/L (ref 96–112)
Potassium: 3.2 mEq/L — ABNORMAL LOW (ref 3.5–5.1)
Total Bilirubin: 0.2 mg/dL — ABNORMAL LOW (ref 0.3–1.2)

## 2012-11-19 LAB — CBC WITH DIFFERENTIAL/PLATELET
Basophils Absolute: 0 10*3/uL (ref 0.0–0.1)
Basophils Relative: 0 % (ref 0–1)
Hemoglobin: 13 g/dL (ref 12.0–15.0)
Lymphocytes Relative: 28 % (ref 12–46)
MCHC: 33.9 g/dL (ref 30.0–36.0)
Monocytes Relative: 7 % (ref 3–12)
Neutro Abs: 6.3 10*3/uL (ref 1.7–7.7)
Neutrophils Relative %: 64 % (ref 43–77)
WBC: 9.9 10*3/uL (ref 4.0–10.5)

## 2012-11-19 LAB — PREGNANCY, URINE: Preg Test, Ur: NEGATIVE

## 2012-11-19 LAB — URINALYSIS, ROUTINE W REFLEX MICROSCOPIC
Hgb urine dipstick: NEGATIVE
Leukocytes, UA: NEGATIVE
Nitrite: NEGATIVE
Protein, ur: NEGATIVE mg/dL
Specific Gravity, Urine: 1.008 (ref 1.005–1.030)
Urobilinogen, UA: 0.2 mg/dL (ref 0.0–1.0)

## 2012-11-19 MED ORDER — HYDROCODONE-ACETAMINOPHEN 5-325 MG PO TABS: 2.0000 | ORAL_TABLET | ORAL | Status: DC | PRN

## 2012-11-19 MED ORDER — IOHEXOL 300 MG/ML  SOLN
100.0000 mL | Freq: Once | INTRAMUSCULAR | Status: AC | PRN
  Administered 2012-11-19: 100 mL via INTRAVENOUS

## 2012-11-19 MED ORDER — MORPHINE SULFATE 4 MG/ML IJ SOLN
4.0000 mg | Freq: Once | INTRAMUSCULAR | Status: AC
  Administered 2012-11-19: 4 mg via INTRAVENOUS
  Filled 2012-11-19: qty 1

## 2012-11-19 MED ORDER — SODIUM CHLORIDE 0.9 % IV BOLUS (SEPSIS)
1000.0000 mL | Freq: Once | INTRAVENOUS | Status: AC
  Administered 2012-11-19: 1000 mL via INTRAVENOUS

## 2012-11-19 MED ORDER — IOHEXOL 300 MG/ML  SOLN
50.0000 mL | Freq: Once | INTRAMUSCULAR | Status: AC | PRN
  Administered 2012-11-19: 50 mL via ORAL

## 2012-11-19 MED ORDER — ONDANSETRON HCL 4 MG/2ML IJ SOLN
4.0000 mg | Freq: Once | INTRAMUSCULAR | Status: AC
  Administered 2012-11-19: 4 mg via INTRAVENOUS
  Filled 2012-11-19: qty 2

## 2012-11-19 NOTE — ED Notes (Signed)
She c/o having ~8-10 diarrhea stools/day x 1 week.  She has seen her doctor, but no meds given, including Imodium and PeptoBismol have been efficatious.  Her skin is pale warm and dry, and she is breathing normally.  She tels me she is having diffuse abd. Discomfort at times radiating into left flank area.
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
## 2012-11-19 NOTE — ED Notes (Signed)
Patient transported to CT 

## 2012-11-20 NOTE — ED Provider Notes (Signed)
Medical screening examination/treatment/procedure(s) were performed by non-physician practitioner and as supervising physician I was immediately available for consultation/collaboration.   Gentle Hoge E Dao Memmott, MD 11/20/12 1950 

## 2012-11-27 ENCOUNTER — Encounter (HOSPITAL_COMMUNITY): Payer: Self-pay | Admitting: Emergency Medicine

## 2012-11-27 ENCOUNTER — Emergency Department (INDEPENDENT_AMBULATORY_CARE_PROVIDER_SITE_OTHER): Payer: Medicaid Other

## 2012-11-27 ENCOUNTER — Emergency Department (HOSPITAL_COMMUNITY)
Admission: EM | Admit: 2012-11-27 | Discharge: 2012-11-27 | Disposition: A | Discharge status: Home / Self Care | Payer: Medicaid Other | Source: Home / Self Care

## 2012-11-27 DIAGNOSIS — IMO0002 Reserved for concepts with insufficient information to code with codable children: Secondary | ICD-10-CM

## 2012-11-27 DIAGNOSIS — S39012A Strain of muscle, fascia and tendon of lower back, initial encounter: Secondary | ICD-10-CM

## 2012-11-27 MED ORDER — NAPROXEN 500 MG PO TABS: 500.0000 mg | ORAL_TABLET | Freq: Two times a day (BID) | ORAL | Status: DC

## 2012-11-27 MED ORDER — TROLAMINE SALICYLATE 10 % EX CREA: TOPICAL_CREAM | CUTANEOUS | Status: DC | PRN

## 2012-11-27 MED ORDER — HYDROCODONE-ACETAMINOPHEN 5-500 MG PO TABS: 1.0000 | ORAL_TABLET | Freq: Four times a day (QID) | ORAL | Status: DC | PRN

## 2012-11-27 MED ORDER — CARISOPRODOL 350 MG PO TABS: 350.0000 mg | ORAL_TABLET | Freq: Four times a day (QID) | ORAL | Status: DC | PRN

## 2012-11-27 NOTE — ED Notes (Signed)
Yesterday patient was helping family move a downed pine tree.  Patient was involved in lifting, pulling and sawing .  Heard a pop in shoulder, since then pain has increased.  Today any movement makes arm pain
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
## 2013-03-23 ENCOUNTER — Emergency Department: Payer: Self-pay | Admitting: Emergency Medicine

## 2013-03-23 LAB — URINALYSIS, COMPLETE
Hyaline Cast: 1
Squamous Epithelial: 25
WBC UR: 1 /HPF (ref 0–5)

## 2013-03-23 LAB — CBC
HCT: 37.7 % (ref 35.0–47.0)
HGB: 13 g/dL (ref 12.0–16.0)
MCHC: 34.4 g/dL (ref 32.0–36.0)
RDW: 12.1 % (ref 11.5–14.5)
WBC: 8.4 10*3/uL (ref 3.6–11.0)

## 2013-03-23 LAB — BASIC METABOLIC PANEL
BUN: 9 mg/dL (ref 7–18)
Calcium, Total: 8.9 mg/dL (ref 8.5–10.1)
Chloride: 105 mmol/L (ref 98–107)
Co2: 28 mmol/L (ref 21–32)
Creatinine: 1.05 mg/dL (ref 0.60–1.30)
Glucose: 95 mg/dL (ref 65–99)
Osmolality: 276 (ref 275–301)
Potassium: 4 mmol/L (ref 3.5–5.1)

## 2013-06-25 ENCOUNTER — Encounter (HOSPITAL_COMMUNITY): Payer: Self-pay | Admitting: *Deleted

## 2013-06-25 ENCOUNTER — Emergency Department (HOSPITAL_COMMUNITY)
Admission: EM | Admit: 2013-06-25 | Discharge: 2013-06-25 | Disposition: A | Discharge status: Nursing Home (Includes ICF) | Payer: Medicaid Other | Source: Home / Self Care | Attending: Emergency Medicine | Admitting: Emergency Medicine

## 2013-06-25 ENCOUNTER — Emergency Department (HOSPITAL_COMMUNITY)
Admission: EM | Admit: 2013-06-25 | Discharge: 2013-06-25 | Disposition: A | Discharge status: Home / Self Care | Payer: Medicaid Other | Attending: Emergency Medicine | Admitting: Emergency Medicine

## 2013-06-25 ENCOUNTER — Encounter (HOSPITAL_COMMUNITY): Payer: Self-pay | Admitting: Emergency Medicine

## 2013-06-25 DIAGNOSIS — Z8659 Personal history of other mental and behavioral disorders: Secondary | ICD-10-CM | POA: Insufficient documentation

## 2013-06-25 DIAGNOSIS — Z87410 Personal history of cervical dysplasia: Secondary | ICD-10-CM | POA: Insufficient documentation

## 2013-06-25 DIAGNOSIS — Z8719 Personal history of other diseases of the digestive system: Secondary | ICD-10-CM | POA: Insufficient documentation

## 2013-06-25 DIAGNOSIS — A088 Other specified intestinal infections: Secondary | ICD-10-CM | POA: Insufficient documentation

## 2013-06-25 DIAGNOSIS — A084 Viral intestinal infection, unspecified: Secondary | ICD-10-CM

## 2013-06-25 DIAGNOSIS — E860 Dehydration: Secondary | ICD-10-CM

## 2013-06-25 DIAGNOSIS — Z9079 Acquired absence of other genital organ(s): Secondary | ICD-10-CM | POA: Insufficient documentation

## 2013-06-25 DIAGNOSIS — Z90710 Acquired absence of both cervix and uterus: Secondary | ICD-10-CM | POA: Insufficient documentation

## 2013-06-25 DIAGNOSIS — Z8742 Personal history of other diseases of the female genital tract: Secondary | ICD-10-CM | POA: Insufficient documentation

## 2013-06-25 DIAGNOSIS — Z8614 Personal history of Methicillin resistant Staphylococcus aureus infection: Secondary | ICD-10-CM | POA: Insufficient documentation

## 2013-06-25 DIAGNOSIS — G8929 Other chronic pain: Secondary | ICD-10-CM | POA: Insufficient documentation

## 2013-06-25 DIAGNOSIS — F17200 Nicotine dependence, unspecified, uncomplicated: Secondary | ICD-10-CM | POA: Insufficient documentation

## 2013-06-25 DIAGNOSIS — Z79899 Other long term (current) drug therapy: Secondary | ICD-10-CM | POA: Insufficient documentation

## 2013-06-25 LAB — POCT URINALYSIS DIP (DEVICE)
Glucose, UA: NEGATIVE mg/dL
Leukocytes, UA: NEGATIVE
Nitrite: POSITIVE — AB
Protein, ur: 100 mg/dL — AB
Urobilinogen, UA: 4 mg/dL — ABNORMAL HIGH (ref 0.0–1.0)

## 2013-06-25 LAB — CBC
HCT: 43.3 % (ref 36.0–46.0)
Hemoglobin: 15.8 g/dL — ABNORMAL HIGH (ref 12.0–15.0)
MCH: 32.4 pg (ref 26.0–34.0)
MCHC: 36.5 g/dL — ABNORMAL HIGH (ref 30.0–36.0)
MCV: 88.9 fL (ref 78.0–100.0)
Platelets: 322 10*3/uL (ref 150–400)
RBC: 4.87 MIL/uL (ref 3.87–5.11)
RDW: 12.1 % (ref 11.5–15.5)
WBC: 9.4 10*3/uL (ref 4.0–10.5)

## 2013-06-25 LAB — BASIC METABOLIC PANEL
BUN: 32 mg/dL — ABNORMAL HIGH (ref 6–23)
CO2: 20 mEq/L (ref 19–32)
Calcium: 9.1 mg/dL (ref 8.4–10.5)
Chloride: 96 mEq/L (ref 96–112)
Creatinine, Ser: 1.22 mg/dL — ABNORMAL HIGH (ref 0.50–1.10)
GFR calc Af Amer: 65 mL/min — ABNORMAL LOW (ref >90)
GFR calc non Af Amer: 56 mL/min — ABNORMAL LOW (ref >90)
Glucose, Bld: 102 mg/dL — ABNORMAL HIGH (ref 70–99)
Potassium: 3.8 mEq/L (ref 3.5–5.1)
Sodium: 134 mEq/L — ABNORMAL LOW (ref 135–145)

## 2013-06-25 MED ORDER — ONDANSETRON 4 MG PO TBDP
ORAL_TABLET | ORAL | Status: AC
  Filled 2013-06-25: qty 1

## 2013-06-25 MED ORDER — PROMETHAZINE HCL 25 MG PO TABS: 25.0000 mg | ORAL_TABLET | Freq: Four times a day (QID) | ORAL | Status: DC | PRN

## 2013-06-25 MED ORDER — SODIUM CHLORIDE 0.9 % IV SOLN: INTRAVENOUS | Status: DC

## 2013-06-25 MED ORDER — ONDANSETRON HCL 4 MG/2ML IJ SOLN
4.0000 mg | Freq: Once | INTRAMUSCULAR | Status: AC
  Administered 2013-06-25: 4 mg via INTRAVENOUS
  Filled 2013-06-25: qty 2

## 2013-06-25 MED ORDER — ONDANSETRON HCL 4 MG/2ML IJ SOLN: 4.0000 mg | Freq: Once | INTRAMUSCULAR | Status: DC

## 2013-06-25 MED ORDER — SODIUM CHLORIDE 0.9 % IV BOLUS (SEPSIS)
1000.0000 mL | INTRAVENOUS | Status: AC
  Administered 2013-06-25: 1000 mL via INTRAVENOUS

## 2013-06-25 MED ORDER — ONDANSETRON 4 MG PO TBDP
4.0000 mg | ORAL_TABLET | Freq: Once | ORAL | Status: AC
  Administered 2013-06-25: 4 mg via ORAL

## 2013-06-25 NOTE — ED Notes (Signed)
Pt c/o n/v/d x 1 wk. Pt went to urgent care and was sent here. Pt has 22 LH.

## 2013-06-25 NOTE — ED Provider Notes (Signed)
Medical screening examination/treatment/procedure(s) were performed by non-physician practitioner and as supervising physician I was immediately available for consultation/collaboration.   Dagmar Hait, MD 06/25/13 [phone]

## 2013-06-25 NOTE — ED Notes (Signed)
Pt c/o n/v/d, chills and general body aches for the last week. Pt states she has not been able to keep any food or medications down. Pt states last diarrhea stool was Friday night but she is still having nausea. Pt denies any pain at this time.

## 2013-06-25 NOTE — ED Notes (Signed)
Discharge instructions reviewed. Pt verbalized understanding.  
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
## 2013-06-25 NOTE — ED Provider Notes (Signed)
Chief Complaint:   Chief Complaint  Patient presents with  . Emesis    History of Present Illness:   Tricia Brown is a female had a one-week history nausea and vomiting of all by mouth intake and diarrhea. In the vomitus at times has been brown and has appearance of coffee grounds. Sometimes yellow. She denies any red blood in the emesis or bilious emesis. She's also had frequent, loose, watery bowel movements daily. Denies any blood in the stool or melena. She's had some moderate right upper quadrant tenderness and chills but no fever. She's felt dizzy, weak, tired, and rundown. She has had mild rhinorrhea but no other URI symptoms. No sick contacts, recent travel, or suspicious ingestions.  Review of Systems:  Other than noted above, the patient denies any of the following symptoms: Systemic:  No fevers, chills, sweats, weight loss or gain, fatigue, or tiredness. ENT:  No nasal congestion, rhinorrhea, or sore throat. Lungs:  No cough, wheezing, or shortness of breath. Cardiac:  No chest pain, syncope, or presyncope. GI:  No abdominal pain, nausea, vomiting, anorexia, diarrhea, constipation, blood in stool or vomitus. GU:  No dysuria, frequency, or urgency.  PMFSH:  Past medical history, family history, social history, meds, and allergies were reviewed.  No medication allergies. She's on a number of meds including soma, Estrace, Robinul, naproxen, Prilosec, Vicodin, and Phenergan. She has a history of vertigo, methadone dependence, depression, irritable bowel syndrome, pancreatitis, chronic back pain, endometriosis, migraine headache, and she is a cigarette smoker.  Physical Exam:   Vital signs:  BP 122/70  Pulse 128  Temp(Src) 98.1 F (36.7 C) (Oral)  Resp 18  SpO2 99% General:  Alert and oriented.  In no distress.  Skin warm and dry.  Good skin turgor, brisk capillary refill. ENT:  No scleral icterus, moist mucous membranes, no oral lesions, pharynx clear. Lungs:  Breath sounds clear  and equal bilaterally.  No wheezes, rales, or rhonchi. Heart:  Rhythm regular, without extrasystoles.  No gallops or murmers. Abdomen:  Soft, flat, nondistended. She has mild lower quadrant tenderness to palpation without guarding or rebound. Bowel sounds are hyperactive. No organomegaly or mass. Skin: Clear, warm, and dry.  Good turgor.  Brisk capillary refill.  Course in Urgent Care Center:   Was begun on normal saline IV and given Zofran ODT 4 mg sublingually.  Assessment:  The primary encounter diagnosis was Viral gastroenteritis. A diagnosis of Dehydration was also pertinent to this visit.  She is moderately dehydrated.  Plan:   The patient was transferred to the ED via [facility] in stable condition.  Medical Decision Making:  37 year old female has a 1 week history of nausea, vomiting of all PO intake, and diarrhea.  No fever but does have chills.  She has slight RUQ pain. On exam she is orthostatic.  Abdomen is benign with hyperactive BS.  My impression is viral gastroenteritis with dehydration.  We have started IV NS and given Zofran ODT 4 mg sublingually.      Reuben Likes, MD 06/25/13 [phone]

## 2013-06-25 NOTE — ED Notes (Signed)
Patient states she has been nauseated with vomiting and diarrhea since last Monday. States dizziness when standing.

## 2013-10-18 ENCOUNTER — Emergency Department (HOSPITAL_COMMUNITY)
Admission: EM | Admit: 2013-10-18 | Discharge: 2013-10-18 | Disposition: A | Discharge status: Home / Self Care | Payer: Medicaid Other | Attending: Emergency Medicine | Admitting: Emergency Medicine

## 2013-10-18 ENCOUNTER — Emergency Department (HOSPITAL_COMMUNITY): Payer: Medicaid Other

## 2013-10-18 ENCOUNTER — Encounter (HOSPITAL_COMMUNITY): Payer: Self-pay | Admitting: Emergency Medicine

## 2013-10-18 DIAGNOSIS — Z87442 Personal history of urinary calculi: Secondary | ICD-10-CM | POA: Insufficient documentation

## 2013-10-18 DIAGNOSIS — Z8614 Personal history of Methicillin resistant Staphylococcus aureus infection: Secondary | ICD-10-CM | POA: Insufficient documentation

## 2013-10-18 DIAGNOSIS — Z8719 Personal history of other diseases of the digestive system: Secondary | ICD-10-CM | POA: Insufficient documentation

## 2013-10-18 DIAGNOSIS — R110 Nausea: Secondary | ICD-10-CM | POA: Insufficient documentation

## 2013-10-18 DIAGNOSIS — Z8679 Personal history of other diseases of the circulatory system: Secondary | ICD-10-CM | POA: Insufficient documentation

## 2013-10-18 DIAGNOSIS — G8929 Other chronic pain: Secondary | ICD-10-CM | POA: Insufficient documentation

## 2013-10-18 DIAGNOSIS — R109 Unspecified abdominal pain: Secondary | ICD-10-CM

## 2013-10-18 DIAGNOSIS — Z90710 Acquired absence of both cervix and uterus: Secondary | ICD-10-CM | POA: Insufficient documentation

## 2013-10-18 DIAGNOSIS — Z8659 Personal history of other mental and behavioral disorders: Secondary | ICD-10-CM | POA: Insufficient documentation

## 2013-10-18 DIAGNOSIS — F17200 Nicotine dependence, unspecified, uncomplicated: Secondary | ICD-10-CM | POA: Insufficient documentation

## 2013-10-18 DIAGNOSIS — R300 Dysuria: Secondary | ICD-10-CM | POA: Insufficient documentation

## 2013-10-18 DIAGNOSIS — Z3202 Encounter for pregnancy test, result negative: Secondary | ICD-10-CM | POA: Insufficient documentation

## 2013-10-18 DIAGNOSIS — Z9889 Other specified postprocedural states: Secondary | ICD-10-CM | POA: Insufficient documentation

## 2013-10-18 DIAGNOSIS — Z79899 Other long term (current) drug therapy: Secondary | ICD-10-CM | POA: Insufficient documentation

## 2013-10-18 DIAGNOSIS — R319 Hematuria, unspecified: Secondary | ICD-10-CM

## 2013-10-18 DIAGNOSIS — R1084 Generalized abdominal pain: Secondary | ICD-10-CM | POA: Insufficient documentation

## 2013-10-18 LAB — POCT I-STAT, CHEM 8
BUN: 7 mg/dL (ref 6–23)
CALCIUM ION: 1.37 mmol/L — AB (ref 1.12–1.23)
Chloride: 103 mEq/L (ref 96–112)
Creatinine, Ser: 0.9 mg/dL (ref 0.50–1.10)
Glucose, Bld: 102 mg/dL — ABNORMAL HIGH (ref 70–99)
HCT: 42 % (ref 36.0–46.0)
Hemoglobin: 14.3 g/dL (ref 12.0–15.0)
Potassium: 3.9 mEq/L (ref 3.7–5.3)
Sodium: 141 mEq/L (ref 137–147)
TCO2: 27 mmol/L (ref 0–100)

## 2013-10-18 LAB — POCT PREGNANCY, URINE: Preg Test, Ur: NEGATIVE

## 2013-10-18 LAB — URINALYSIS, ROUTINE W REFLEX MICROSCOPIC
BILIRUBIN URINE: NEGATIVE
Glucose, UA: NEGATIVE mg/dL
Ketones, ur: NEGATIVE mg/dL
LEUKOCYTES UA: NEGATIVE
Nitrite: NEGATIVE
PH: 6.5 (ref 5.0–8.0)
Protein, ur: NEGATIVE mg/dL
Specific Gravity, Urine: 1.01 (ref 1.005–1.030)
Urobilinogen, UA: 0.2 mg/dL (ref 0.0–1.0)

## 2013-10-18 LAB — URINE MICROSCOPIC-ADD ON

## 2013-10-18 MED ORDER — KETOROLAC TROMETHAMINE 60 MG/2ML IM SOLN
30.0000 mg | Freq: Once | INTRAMUSCULAR | Status: AC
  Administered 2013-10-18: 30 mg via INTRAMUSCULAR
  Filled 2013-10-18: qty 2

## 2013-10-18 MED ORDER — NAPROXEN 500 MG PO TABS: 500.0000 mg | ORAL_TABLET | Freq: Two times a day (BID) | ORAL | Status: DC

## 2013-10-18 MED ORDER — PROMETHAZINE HCL 25 MG PO TABS: 25.0000 mg | ORAL_TABLET | Freq: Four times a day (QID) | ORAL | Status: DC | PRN

## 2013-10-18 NOTE — Discharge Instructions (Signed)
Flank Pain Flank pain refers to pain that is located on the side of the body between the upper abdomen and the back. The pain may occur over a short period of time (acute) or may be long-term or reoccurring (chronic). It may be mild or severe. Flank pain can be caused by many things. CAUSES  Some of the more common causes of flank pain include:  Muscle strains.   Muscle spasms.   A disease of your spine (vertebral disk disease).   A lung infection (pneumonia).   Fluid around your lungs (pulmonary edema).   A kidney infection.   Kidney stones.   A very painful skin rash caused by the chickenpox virus (shingles).   Gallbladder disease.  HOME CARE INSTRUCTIONS  Home care will depend on the cause of your pain. In general,  Rest as directed by your caregiver.  Drink enough fluids to keep your urine clear or pale yellow.  Only take over-the-counter or prescription medicines as directed by your caregiver. Some medicines may help relieve the pain.  Tell your caregiver about any changes in your pain.  Follow up with your caregiver as directed. SEEK IMMEDIATE MEDICAL CARE IF:   Your pain is not controlled with medicine.   You have new or worsening symptoms.  Your pain increases.   You have abdominal pain.   You have shortness of breath.   You have persistent nausea or vomiting.   You have swelling in your abdomen.   You feel faint or pass out.   You have blood in your urine.  You have a fever or persistent symptoms for more than 2 3 days.  You have a fever and your symptoms suddenly get worse. MAKE SURE YOU:   Understand these instructions.  Will watch your condition.  Will get help right away if you are not doing well or get worse. Document Released: 10/29/2005 Document Revised: 06/01/2012 Document Reviewed: 04/21/2012 [facility] Patient Information 2014 [place], [place].  Flank Pain Flank pain refers to pain that is located on the side of the  body between the upper abdomen and the back. The pain may occur over a short period of time (acute) or may be long-term or reoccurring (chronic). It may be mild or severe. Flank pain can be caused by many things. CAUSES  Some of the more common causes of flank pain include:  Muscle strains.   Muscle spasms.   A disease of your spine (vertebral disk disease).   A lung infection (pneumonia).   Fluid around your lungs (pulmonary edema).   A kidney infection.   Kidney stones.   A very painful skin rash caused by the chickenpox virus (shingles).   Gallbladder disease.  HOME CARE INSTRUCTIONS  Home care will depend on the cause of your pain. In general,  Rest as directed by your caregiver.  Drink enough fluids to keep your urine clear or pale yellow.  Only take over-the-counter or prescription medicines as directed by your caregiver. Some medicines may help relieve the pain.  Tell your caregiver about any changes in your pain.  Follow up with your caregiver as directed. SEEK IMMEDIATE MEDICAL CARE IF:   Your pain is not controlled with medicine.   You have new or worsening symptoms.  Your pain increases.   You have abdominal pain.   You have shortness of breath.   You have persistent nausea or vomiting.   You have swelling in your abdomen.   You feel faint or pass out.  You have blood in your urine.  You have a fever or persistent symptoms for more than 2 3 days.  You have a fever and your symptoms suddenly get worse. MAKE SURE YOU:   Understand these instructions.  Will watch your condition.  Will get help right away if you are not doing well or get worse. Document Released: 10/29/2005 Document Revised: 06/01/2012 Document Reviewed: 04/21/2012 [facility] Patient Information 2014 [place], [place].

## 2013-10-18 NOTE — ED Notes (Signed)
MD at bedside. 

## 2013-10-18 NOTE — ED Notes (Signed)
Pt c/o R flank pain since yesterday. Pt states it feels like a kidney stone. Pt states pain is intermittent. Pt has taken Motrin for pain, but it is not helping much. Pt ambulatory with steady gait. Pt alert, no acute distress. Skin warm and dry.
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
## 2013-10-19 NOTE — ED Provider Notes (Signed)
Medical screening examination/treatment/procedure(s) were performed by non-physician practitioner and as supervising physician I was immediately available for consultation/collaboration.  EKG Interpretation   None        Ksean Vale, MD 10/19/13 0202 

## 2014-05-15 ENCOUNTER — Emergency Department (HOSPITAL_COMMUNITY)
Admission: EM | Admit: 2014-05-15 | Discharge: 2014-05-15 | Disposition: A | Discharge status: Home / Self Care | Payer: Medicaid Other | Attending: Emergency Medicine | Admitting: Emergency Medicine

## 2014-05-15 ENCOUNTER — Encounter (HOSPITAL_COMMUNITY): Payer: Self-pay | Admitting: Emergency Medicine

## 2014-05-15 ENCOUNTER — Emergency Department (HOSPITAL_COMMUNITY)
Admission: EM | Admit: 2014-05-15 | Discharge: 2014-05-16 | Disposition: A | Discharge status: Home / Self Care | Payer: Medicaid Other | Source: Home / Self Care | Attending: Emergency Medicine | Admitting: Emergency Medicine

## 2014-05-15 ENCOUNTER — Emergency Department (HOSPITAL_COMMUNITY): Payer: Medicaid Other

## 2014-05-15 DIAGNOSIS — L03119 Cellulitis of unspecified part of limb: Secondary | ICD-10-CM | POA: Diagnosis present

## 2014-05-15 DIAGNOSIS — L02419 Cutaneous abscess of limb, unspecified: Secondary | ICD-10-CM | POA: Diagnosis present

## 2014-05-15 DIAGNOSIS — Z8742 Personal history of other diseases of the female genital tract: Secondary | ICD-10-CM | POA: Diagnosis not present

## 2014-05-15 DIAGNOSIS — Z792 Long term (current) use of antibiotics: Secondary | ICD-10-CM | POA: Insufficient documentation

## 2014-05-15 DIAGNOSIS — M7989 Other specified soft tissue disorders: Secondary | ICD-10-CM | POA: Insufficient documentation

## 2014-05-15 DIAGNOSIS — Z8614 Personal history of Methicillin resistant Staphylococcus aureus infection: Secondary | ICD-10-CM | POA: Diagnosis not present

## 2014-05-15 DIAGNOSIS — Z79899 Other long term (current) drug therapy: Secondary | ICD-10-CM | POA: Insufficient documentation

## 2014-05-15 DIAGNOSIS — G8929 Other chronic pain: Secondary | ICD-10-CM | POA: Insufficient documentation

## 2014-05-15 DIAGNOSIS — F17200 Nicotine dependence, unspecified, uncomplicated: Secondary | ICD-10-CM | POA: Diagnosis not present

## 2014-05-15 DIAGNOSIS — Z8719 Personal history of other diseases of the digestive system: Secondary | ICD-10-CM | POA: Diagnosis not present

## 2014-05-15 DIAGNOSIS — Z8659 Personal history of other mental and behavioral disorders: Secondary | ICD-10-CM | POA: Insufficient documentation

## 2014-05-15 LAB — CBC WITH DIFFERENTIAL/PLATELET
Basophils Absolute: 0.1 10*3/uL (ref 0.0–0.1)
Basophils Relative: 1 % (ref 0–1)
Eosinophils Absolute: 0.4 10*3/uL (ref 0.0–0.7)
Eosinophils Relative: 4 % (ref 0–5)
HCT: 33.7 % — ABNORMAL LOW (ref 36.0–46.0)
Hemoglobin: 11.5 g/dL — ABNORMAL LOW (ref 12.0–15.0)
LYMPHS ABS: 3 10*3/uL (ref 0.7–4.0)
LYMPHS PCT: 33 % (ref 12–46)
MCH: 31.9 pg (ref 26.0–34.0)
MCHC: 34.1 g/dL (ref 30.0–36.0)
MCV: 93.6 fL (ref 78.0–100.0)
Monocytes Absolute: 0.7 10*3/uL (ref 0.1–1.0)
Monocytes Relative: 8 % (ref 3–12)
NEUTROS PCT: 54 % (ref 43–77)
Neutro Abs: 5 10*3/uL (ref 1.7–7.7)
PLATELETS: 329 10*3/uL (ref 150–400)
RBC: 3.6 MIL/uL — ABNORMAL LOW (ref 3.87–5.11)
RDW: 12.5 % (ref 11.5–15.5)
WBC: 9.1 10*3/uL (ref 4.0–10.5)

## 2014-05-15 LAB — PRO B NATRIURETIC PEPTIDE: PRO B NATRI PEPTIDE: 53.2 pg/mL (ref 0–125)

## 2014-05-15 LAB — BASIC METABOLIC PANEL
Anion gap: 11 (ref 5–15)
BUN: 8 mg/dL (ref 6–23)
CO2: 28 meq/L (ref 19–32)
Calcium: 9.3 mg/dL (ref 8.4–10.5)
Chloride: 100 mEq/L (ref 96–112)
Creatinine, Ser: 0.83 mg/dL (ref 0.50–1.10)
GFR calc Af Amer: >90 mL/min (ref >90)
GFR calc non Af Amer: 88 mL/min — ABNORMAL LOW (ref >90)
GLUCOSE: 68 mg/dL — AB (ref 70–99)
POTASSIUM: 3.6 meq/L — AB (ref 3.7–5.3)
SODIUM: 139 meq/L (ref 137–147)

## 2014-05-15 LAB — URINALYSIS, ROUTINE W REFLEX MICROSCOPIC
Bilirubin Urine: NEGATIVE
Glucose, UA: NEGATIVE mg/dL
Hgb urine dipstick: NEGATIVE
Ketones, ur: NEGATIVE mg/dL
NITRITE: NEGATIVE
Protein, ur: NEGATIVE mg/dL
SPECIFIC GRAVITY, URINE: 1.013 (ref 1.005–1.030)
Urobilinogen, UA: 1 mg/dL (ref 0.0–1.0)
pH: 7 (ref 5.0–8.0)

## 2014-05-15 LAB — URINE MICROSCOPIC-ADD ON

## 2014-05-15 LAB — D-DIMER, QUANTITATIVE: D-Dimer, Quant: 0.33 ug/mL-FEU (ref 0.00–0.48)

## 2014-05-15 MED ORDER — HYDROCODONE-ACETAMINOPHEN 5-325 MG PO TABS
1.0000 | ORAL_TABLET | Freq: Once | ORAL | Status: AC
  Administered 2014-05-15: 1 via ORAL
  Filled 2014-05-15: qty 1

## 2014-05-15 NOTE — ED Notes (Signed)
Pt c/o bilat lower leg swelling and pain. Pt sates that this has happened over the last 24 hours.

## 2014-05-15 NOTE — ED Notes (Signed)
Patient transported to X-ray 

## 2014-05-15 NOTE — ED Notes (Signed)
Pt c/o bilat lower leg swelling and pain. Pt states that has happened over the past 24 hours. Pt states that she never has had any problems like this before even when she was pregnant.

## 2014-05-15 NOTE — ED Notes (Signed)
Bed: [facility] Expected date:  Expected time:  Means of arrival:  Comments: [address]
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
## 2014-05-16 MED ORDER — DOXYCYCLINE HYCLATE 100 MG PO CAPS: 100.0000 mg | ORAL_CAPSULE | Freq: Two times a day (BID) | ORAL | Status: DC

## 2014-05-16 NOTE — ED Provider Notes (Signed)
Medical screening examination/treatment/procedure(s) were conducted as a shared visit with non-physician practitioner(s) and myself.  I personally evaluated the patient during the encounter.  3 day history of bilateral leg swelling with redness.  No fever, CP, SOB.  No trauma.  Started on L ankle, now on right.  Mild edema to ankle bilaterally with subtle erythema, worse medially. Extends to mid tibia. Intact distal pulses. FROM ankle and knee joints without pain.  No calf tenderness. On hormones but D-dimer negative. Doubt bilateral DVT. Treat for possible cellulitis, recheck in 48 hours. Observation admission offered but declined.   EKG Interpretation None       Glynn Octave, MD 05/16/14 1037

## 2014-05-16 NOTE — Discharge Instructions (Signed)

## 2014-09-29 ENCOUNTER — Emergency Department (HOSPITAL_COMMUNITY)
Admission: EM | Admit: 2014-09-29 | Discharge: 2014-09-30 | Disposition: A | Discharge status: Home / Self Care | Payer: Medicaid Other | Attending: Emergency Medicine | Admitting: Emergency Medicine

## 2014-09-29 ENCOUNTER — Encounter (HOSPITAL_COMMUNITY): Payer: Self-pay | Admitting: Emergency Medicine

## 2014-09-29 DIAGNOSIS — Z90710 Acquired absence of both cervix and uterus: Secondary | ICD-10-CM | POA: Diagnosis not present

## 2014-09-29 DIAGNOSIS — Z720 Tobacco use: Secondary | ICD-10-CM | POA: Diagnosis not present

## 2014-09-29 DIAGNOSIS — Z90722 Acquired absence of ovaries, bilateral: Secondary | ICD-10-CM | POA: Diagnosis not present

## 2014-09-29 DIAGNOSIS — R000 Tachycardia, unspecified: Secondary | ICD-10-CM | POA: Insufficient documentation

## 2014-09-29 DIAGNOSIS — Z8614 Personal history of Methicillin resistant Staphylococcus aureus infection: Secondary | ICD-10-CM | POA: Diagnosis not present

## 2014-09-29 DIAGNOSIS — Z8742 Personal history of other diseases of the female genital tract: Secondary | ICD-10-CM | POA: Insufficient documentation

## 2014-09-29 DIAGNOSIS — Z8679 Personal history of other diseases of the circulatory system: Secondary | ICD-10-CM | POA: Diagnosis not present

## 2014-09-29 DIAGNOSIS — Z9889 Other specified postprocedural states: Secondary | ICD-10-CM | POA: Diagnosis not present

## 2014-09-29 DIAGNOSIS — Z8719 Personal history of other diseases of the digestive system: Secondary | ICD-10-CM | POA: Insufficient documentation

## 2014-09-29 DIAGNOSIS — N12 Tubulo-interstitial nephritis, not specified as acute or chronic: Secondary | ICD-10-CM

## 2014-09-29 DIAGNOSIS — Z87410 Personal history of cervical dysplasia: Secondary | ICD-10-CM | POA: Insufficient documentation

## 2014-09-29 DIAGNOSIS — Z9079 Acquired absence of other genital organ(s): Secondary | ICD-10-CM | POA: Insufficient documentation

## 2014-09-29 DIAGNOSIS — Z79899 Other long term (current) drug therapy: Secondary | ICD-10-CM | POA: Diagnosis not present

## 2014-09-29 DIAGNOSIS — R300 Dysuria: Secondary | ICD-10-CM | POA: Diagnosis present

## 2014-09-29 DIAGNOSIS — Z3202 Encounter for pregnancy test, result negative: Secondary | ICD-10-CM | POA: Diagnosis not present

## 2014-09-29 DIAGNOSIS — G8929 Other chronic pain: Secondary | ICD-10-CM | POA: Insufficient documentation

## 2014-09-29 DIAGNOSIS — Z793 Long term (current) use of hormonal contraceptives: Secondary | ICD-10-CM | POA: Insufficient documentation

## 2014-09-29 DIAGNOSIS — Z792 Long term (current) use of antibiotics: Secondary | ICD-10-CM | POA: Insufficient documentation

## 2014-09-29 LAB — URINALYSIS, ROUTINE W REFLEX MICROSCOPIC
Bilirubin Urine: NEGATIVE
GLUCOSE, UA: NEGATIVE mg/dL
Ketones, ur: NEGATIVE mg/dL
Nitrite: POSITIVE — AB
PROTEIN: NEGATIVE mg/dL
Specific Gravity, Urine: 1.018 (ref 1.005–1.030)
Urobilinogen, UA: 1 mg/dL (ref 0.0–1.0)
pH: 6 (ref 5.0–8.0)

## 2014-09-29 LAB — COMPREHENSIVE METABOLIC PANEL
ALT: 40 U/L — AB (ref 0–35)
AST: 29 U/L (ref 0–37)
Albumin: 3.2 g/dL — ABNORMAL LOW (ref 3.5–5.2)
Alkaline Phosphatase: 164 U/L — ABNORMAL HIGH (ref 39–117)
Anion gap: 6 (ref 5–15)
BUN: 7 mg/dL (ref 6–23)
CALCIUM: 8.1 mg/dL — AB (ref 8.4–10.5)
CHLORIDE: 101 meq/L (ref 96–112)
CO2: 24 mmol/L (ref 19–32)
Creatinine, Ser: 0.83 mg/dL (ref 0.50–1.10)
GFR calc non Af Amer: 88 mL/min — ABNORMAL LOW (ref >90)
GLUCOSE: 78 mg/dL (ref 70–99)
Potassium: 2.7 mmol/L — CL (ref 3.5–5.1)
SODIUM: 131 mmol/L — AB (ref 135–145)
Total Bilirubin: 0.4 mg/dL (ref 0.3–1.2)
Total Protein: 6.9 g/dL (ref 6.0–8.3)

## 2014-09-29 LAB — POC URINE PREG, ED: Preg Test, Ur: NEGATIVE

## 2014-09-29 LAB — CBC WITH DIFFERENTIAL/PLATELET
Basophils Absolute: 0 10*3/uL (ref 0.0–0.1)
Basophils Relative: 0 % (ref 0–1)
EOS ABS: 0.1 10*3/uL (ref 0.0–0.7)
EOS PCT: 1 % (ref 0–5)
HCT: 33.8 % — ABNORMAL LOW (ref 36.0–46.0)
Hemoglobin: 11.6 g/dL — ABNORMAL LOW (ref 12.0–15.0)
LYMPHS ABS: 1.4 10*3/uL (ref 0.7–4.0)
Lymphocytes Relative: 12 % (ref 12–46)
MCH: 32.7 pg (ref 26.0–34.0)
MCHC: 34.3 g/dL (ref 30.0–36.0)
MCV: 95.2 fL (ref 78.0–100.0)
MONO ABS: 1.8 10*3/uL — AB (ref 0.1–1.0)
MONOS PCT: 15 % — AB (ref 3–12)
Neutro Abs: 8.4 10*3/uL — ABNORMAL HIGH (ref 1.7–7.7)
Neutrophils Relative %: 72 % (ref 43–77)
PLATELETS: 416 10*3/uL — AB (ref 150–400)
RBC: 3.55 MIL/uL — ABNORMAL LOW (ref 3.87–5.11)
RDW: 12.3 % (ref 11.5–15.5)
WBC: 11.8 10*3/uL — ABNORMAL HIGH (ref 4.0–10.5)

## 2014-09-29 LAB — URINE MICROSCOPIC-ADD ON

## 2014-09-29 LAB — LIPASE, BLOOD: Lipase: 17 U/L (ref 11–59)

## 2014-09-29 LAB — PREGNANCY, URINE: Preg Test, Ur: NEGATIVE

## 2014-09-29 MED ORDER — DEXTROSE 5 % IV SOLN
1.0000 g | Freq: Once | INTRAVENOUS | Status: AC
  Administered 2014-09-29: 1 g via INTRAVENOUS
  Filled 2014-09-29: qty 10

## 2014-09-29 MED ORDER — HYDROMORPHONE HCL 1 MG/ML IJ SOLN
1.0000 mg | Freq: Once | INTRAMUSCULAR | Status: AC
  Administered 2014-09-29: 1 mg via INTRAVENOUS
  Filled 2014-09-29: qty 1

## 2014-09-29 MED ORDER — CEPHALEXIN 500 MG PO CAPS: 500.0000 mg | ORAL_CAPSULE | Freq: Four times a day (QID) | ORAL | Status: DC

## 2014-09-29 MED ORDER — HYDROCODONE-ACETAMINOPHEN 5-325 MG PO TABS: 1.0000 | ORAL_TABLET | Freq: Four times a day (QID) | ORAL | Status: DC | PRN

## 2014-09-29 MED ORDER — SODIUM CHLORIDE 0.9 % IV BOLUS (SEPSIS)
1000.0000 mL | Freq: Once | INTRAVENOUS | Status: AC
  Administered 2014-09-29: 1000 mL via INTRAVENOUS

## 2014-09-29 MED ORDER — POTASSIUM CHLORIDE CRYS ER 20 MEQ PO TBCR
60.0000 meq | EXTENDED_RELEASE_TABLET | Freq: Once | ORAL | Status: AC
  Administered 2014-09-29: 60 meq via ORAL
  Filled 2014-09-29: qty 3

## 2014-09-29 NOTE — ED Notes (Signed)
Amy from lab called to report a critical potassium of 2.7

## 2014-09-29 NOTE — ED Notes (Signed)
Pt arrived to the ED with a complaint of flank pain.  More on the left side than the right.  Pt has dysuria.  Pt states she has abdominal pain.
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
## 2014-09-29 NOTE — ED Notes (Signed)
MD at bedside. 

## 2014-09-29 NOTE — Discharge Instructions (Signed)
As discussed, it is important that you follow up as sscheduled with your physician for continued management of your condition.  If you develop any new, or concerning changes in your condition, please return to the emergency department immediately.   Pyelonephritis, Adult Pyelonephritis is a kidney infection. A kidney infection can happen quickly, or it can last for a long time. HOME CARE   Take your medicine (antibiotics) as told. Finish it even if you start to feel better.  Keep all doctor visits as told.  Drink enough fluids to keep your pee (urine) clear or pale yellow.  Only take medicine as told by your doctor. GET HELP RIGHT AWAY IF:   You have a fever or lasting symptoms for more than 2-3 days.  You have a fever and your symptoms suddenly get worse.  You cannot take your medicine or drink fluids as told.  You have chills and shaking.  You feel very weak or pass out (faint).  You do not feel better after 2 days. MAKE SURE YOU:  Understand these instructions.  Will watch your condition.  Will get help right away if you are not doing well or get worse. Document Released: 10/15/2004 Document Revised: 03/08/2012 Document Reviewed: 02/25/2011 [facility] Patient Information 2015 [place], [place]. This information is not intended to replace advice given to you by your health care provider. Make sure you discuss any questions you have with your health care provider.

## 2014-10-02 LAB — URINE CULTURE: Colony Count: >=100000

## 2014-10-03 ENCOUNTER — Telehealth (HOSPITAL_BASED_OUTPATIENT_CLINIC_OR_DEPARTMENT_OTHER): Payer: Self-pay | Admitting: Emergency Medicine

## 2014-10-03 NOTE — Telephone Encounter (Signed)
Post ED Visit - Positive Culture Follow-up  Culture report reviewed by antimicrobial stewardship pharmacist: []  Wes Dulaney, Pharm.D., BCPS []  Celedonio MiyamotoJeremy Frens, Pharm.D., BCPS [x]  Georgina PillionElizabeth Martin, Pharm.D., BCPS []  [place]h Pham, VermontPharm.D., BCPS, AAHIVP []  Estella HuskMichelle Turner, Pharm.D., BCPS, AAHIVP []  Elder CyphersLorie Poole, [address] Rainbow BoulevardPharm.D., BCPS  Positive urine culture E. Coli Treated with cephalexin, organism sensitive to the same and no further patient follow-up is required at this time.  Berle MullMiller, Krissi Willaims 10/03/2014, 3:35 PM

## 2014-10-11 ENCOUNTER — Ambulatory Visit: Payer: Self-pay | Admitting: Urology

## 2015-02-06 ENCOUNTER — Other Ambulatory Visit: Payer: Self-pay | Admitting: Urology

## 2015-02-06 DIAGNOSIS — R319 Hematuria, unspecified: Secondary | ICD-10-CM

## 2015-02-11 ENCOUNTER — Ambulatory Visit: Admission: RE | Admit: 2015-02-11 | Payer: Medicaid Other | Source: Ambulatory Visit

## 2015-02-13 ENCOUNTER — Ambulatory Visit
Admission: RE | Admit: 2015-02-13 | Discharge: 2015-02-13 | Disposition: A | Discharge status: Home / Self Care | Payer: Medicaid Other | Source: Ambulatory Visit | Attending: Urology | Admitting: Urology

## 2015-02-13 DIAGNOSIS — R319 Hematuria, unspecified: Secondary | ICD-10-CM | POA: Insufficient documentation

## 2015-02-13 MED ORDER — IOHEXOL 300 MG/ML  SOLN
125.0000 mL | Freq: Once | INTRAMUSCULAR | Status: AC | PRN
  Administered 2015-02-13: 125 mL via INTRAVENOUS

## 2015-02-22 ENCOUNTER — Telehealth: Payer: Self-pay | Admitting: *Deleted

## 2015-02-22 ENCOUNTER — Other Ambulatory Visit: Payer: Self-pay | Admitting: *Deleted

## 2015-02-22 DIAGNOSIS — N390 Urinary tract infection, site not specified: Secondary | ICD-10-CM

## 2015-02-22 MED ORDER — NITROFURANTOIN MONOHYD MACRO 100 MG PO CAPS: 100.0000 mg | ORAL_CAPSULE | Freq: Two times a day (BID) | ORAL | Status: DC

## 2015-02-22 NOTE — Telephone Encounter (Signed)
Spoke with patient and gave message from allscripts and let her know the recent culture was positive and Macrobid was called into her pharmacy.Patient ok with plan.

## 2015-02-25 ENCOUNTER — Encounter: Payer: Self-pay | Admitting: *Deleted

## 2015-02-25 ENCOUNTER — Inpatient Hospital Stay: Admission: RE | Admit: 2015-02-25 | Payer: Medicaid Other | Source: Ambulatory Visit

## 2015-02-25 DIAGNOSIS — N3021 Other chronic cystitis with hematuria: Secondary | ICD-10-CM | POA: Diagnosis not present

## 2015-02-25 DIAGNOSIS — R310 Gross hematuria: Secondary | ICD-10-CM | POA: Diagnosis present

## 2015-02-25 DIAGNOSIS — N393 Stress incontinence (female) (male): Secondary | ICD-10-CM | POA: Diagnosis not present

## 2015-02-25 DIAGNOSIS — Z87440 Personal history of urinary (tract) infections: Secondary | ICD-10-CM | POA: Diagnosis not present

## 2015-02-25 DIAGNOSIS — Z79899 Other long term (current) drug therapy: Secondary | ICD-10-CM | POA: Diagnosis not present

## 2015-02-25 DIAGNOSIS — F17210 Nicotine dependence, cigarettes, uncomplicated: Secondary | ICD-10-CM | POA: Diagnosis not present

## 2015-02-25 DIAGNOSIS — N3289 Other specified disorders of bladder: Secondary | ICD-10-CM | POA: Diagnosis not present

## 2015-02-25 DIAGNOSIS — R7989 Other specified abnormal findings of blood chemistry: Secondary | ICD-10-CM | POA: Diagnosis not present

## 2015-02-25 DIAGNOSIS — R102 Pelvic and perineal pain: Secondary | ICD-10-CM | POA: Diagnosis present

## 2015-02-25 NOTE — Patient Instructions (Signed)
  Your procedure is scheduled on: 02-27-15 Report to [facility] 2ND FLOOR  To find out your arrival time please call [phone] between 1PM - 3PM on 02-26-15 (TUESDAY)  Remember: Instructions that are not followed completely may result in serious medical risk, up to and including death, or upon the discretion of your surgeon and anesthesiologist your surgery may need to be rescheduled.    _X___ 1. Do not eat food or drink liquids after midnight. No gum chewing or hard candies.     _X___ 2. No Alcohol for 24 hours before or after surgery.   ____ 3. Bring all medications with you on the day of surgery if instructed.    _X___ 4. Notify your doctor if there is any change in your medical condition     (cold, fever, infections).     Do not wear jewelry, make-up, hairpins, clips or nail polish.  Do not wear lotions, powders, or perfumes. You may wear deodorant.  Do not shave 48 hours prior to surgery. Men may shave face and neck.  Do not bring valuables to the hospital.    [facility] any belongings or valuables.               Contacts, dentures or bridgework may not be worn into surgery.  Leave your suitcase in the car. After surgery it may be brought to your room.  For patients admitted to the hospital, discharge time is determined by your treatment team.   Patients discharged the day of surgery will not be allowed to drive home.   Please read over the following fact sheets that you were given:      _X___ Take these medicines the morning of surgery with A SIP OF WATER:    1. TRILIPIX  2.   3.   4.  5.  6.  ____ Fleet Enema (as directed)   ____ Use CHG Soap as directed  ____ Use inhalers on the day of surgery  ____ Stop metformin 2 days prior to surgery    ____ Take 1/2 of usual insulin dose the night before surgery and none on the morning of surgery.   ____ Stop Coumadin/Plavix/aspirin-N/A  ____ Stop Anti-inflammatories-NO NSAIDS  OR ASA PRODUCTS-TYLENOL OK   _X___ Stop supplements until after surgery-STOP CRANBERRY NOW  ____ Bring C-Pap to the hospital.

## 2015-02-26 ENCOUNTER — Encounter
Admission: RE | Admit: 2015-02-26 | Discharge: 2015-02-26 | Disposition: A | Discharge status: Home / Self Care | Payer: Medicaid Other | Source: Ambulatory Visit | Attending: Urology | Admitting: Urology

## 2015-02-26 DIAGNOSIS — Z01812 Encounter for preprocedural laboratory examination: Secondary | ICD-10-CM | POA: Insufficient documentation

## 2015-02-26 DIAGNOSIS — R310 Gross hematuria: Secondary | ICD-10-CM | POA: Insufficient documentation

## 2015-02-26 LAB — CBC
HEMATOCRIT: 38.7 % (ref 35.0–47.0)
HEMOGLOBIN: 12.8 g/dL (ref 12.0–16.0)
MCH: 32.5 pg (ref 26.0–34.0)
MCHC: 33 g/dL (ref 32.0–36.0)
MCV: 98.4 fL (ref 80.0–100.0)
PLATELETS: 334 10*3/uL (ref 150–440)
RBC: 3.93 MIL/uL (ref 3.80–5.20)
RDW: 13.5 % (ref 11.5–14.5)
WBC: 9.9 10*3/uL (ref 3.6–11.0)

## 2015-02-26 LAB — BASIC METABOLIC PANEL
Anion gap: 10 (ref 5–15)
BUN: 14 mg/dL (ref 6–20)
CHLORIDE: 107 mmol/L (ref 101–111)
CO2: 24 mmol/L (ref 22–32)
Calcium: 9.3 mg/dL (ref 8.9–10.3)
Creatinine, Ser: 0.95 mg/dL (ref 0.44–1.00)
GFR calc Af Amer: >60 mL/min (ref >60)
GFR calc non Af Amer: >60 mL/min (ref >60)
Glucose, Bld: 134 mg/dL — ABNORMAL HIGH (ref 65–99)
POTASSIUM: 3.4 mmol/L — AB (ref 3.5–5.1)
Sodium: 141 mmol/L (ref 135–145)

## 2015-02-27 ENCOUNTER — Ambulatory Visit
Admission: RE | Admit: 2015-02-27 | Discharge: 2015-02-27 | Disposition: A | Discharge status: Home / Self Care | Payer: Medicaid Other | Source: Ambulatory Visit | Attending: Urology | Admitting: Urology

## 2015-02-27 ENCOUNTER — Ambulatory Visit: Payer: Medicaid Other | Admitting: Anesthesiology

## 2015-02-27 ENCOUNTER — Encounter
Admission: RE | Disposition: A | Discharge status: Home / Self Care | Payer: Self-pay | Source: Ambulatory Visit | Attending: Urology

## 2015-02-27 ENCOUNTER — Telehealth: Payer: Self-pay | Admitting: Urology

## 2015-02-27 ENCOUNTER — Encounter: Payer: Self-pay | Admitting: *Deleted

## 2015-02-27 DIAGNOSIS — Z79899 Other long term (current) drug therapy: Secondary | ICD-10-CM | POA: Insufficient documentation

## 2015-02-27 DIAGNOSIS — F17210 Nicotine dependence, cigarettes, uncomplicated: Secondary | ICD-10-CM | POA: Insufficient documentation

## 2015-02-27 DIAGNOSIS — Z87440 Personal history of urinary (tract) infections: Secondary | ICD-10-CM | POA: Insufficient documentation

## 2015-02-27 DIAGNOSIS — N3021 Other chronic cystitis with hematuria: Secondary | ICD-10-CM | POA: Diagnosis not present

## 2015-02-27 DIAGNOSIS — N3289 Other specified disorders of bladder: Secondary | ICD-10-CM | POA: Insufficient documentation

## 2015-02-27 DIAGNOSIS — R310 Gross hematuria: Secondary | ICD-10-CM | POA: Insufficient documentation

## 2015-02-27 DIAGNOSIS — R7989 Other specified abnormal findings of blood chemistry: Secondary | ICD-10-CM | POA: Insufficient documentation

## 2015-02-27 DIAGNOSIS — N393 Stress incontinence (female) (male): Secondary | ICD-10-CM | POA: Insufficient documentation

## 2015-02-27 HISTORY — PX: CYSTOSCOPY W/ RETROGRADES: SHX1426

## 2015-02-27 HISTORY — DX: Anemia, unspecified: D64.9

## 2015-02-27 SURGERY — CYSTOSCOPY, WITH RETROGRADE PYELOGRAM
Anesthesia: General | Laterality: Bilateral | Wound class: Clean Contaminated

## 2015-02-27 MED ORDER — IOTHALAMATE MEGLUMINE 43 % IV SOLN
INTRAVENOUS | Status: DC | PRN
  Administered 2015-02-27: 35 mL

## 2015-02-27 MED ORDER — ONDANSETRON HCL 4 MG/2ML IJ SOLN: 4.0000 mg | Freq: Once | INTRAMUSCULAR | Status: DC | PRN

## 2015-02-27 MED ORDER — HYDROMORPHONE HCL 1 MG/ML IJ SOLN
INTRAMUSCULAR | Status: AC
  Administered 2015-02-27: 0.5 mg via INTRAVENOUS
  Filled 2015-02-27: qty 1

## 2015-02-27 MED ORDER — FAMOTIDINE 20 MG PO TABS
20.0000 mg | ORAL_TABLET | Freq: Once | ORAL | Status: AC
  Administered 2015-02-27: 20 mg via ORAL

## 2015-02-27 MED ORDER — ONDANSETRON HCL 4 MG/2ML IJ SOLN
INTRAMUSCULAR | Status: DC | PRN
  Administered 2015-02-27: 4 mg via INTRAVENOUS

## 2015-02-27 MED ORDER — HYDROMORPHONE HCL 1 MG/ML IJ SOLN
0.2500 mg | INTRAMUSCULAR | Status: DC | PRN
  Administered 2015-02-27 (×4): 0.5 mg via INTRAVENOUS

## 2015-02-27 MED ORDER — CEFAZOLIN SODIUM 1-5 GM-% IV SOLN
1.0000 g | Freq: Once | INTRAVENOUS | Status: AC
  Administered 2015-02-27: 1 g via INTRAVENOUS

## 2015-02-27 MED ORDER — FAMOTIDINE 20 MG PO TABS
ORAL_TABLET | ORAL | Status: AC
  Administered 2015-02-27: 20 mg via ORAL
  Filled 2015-02-27: qty 1

## 2015-02-27 MED ORDER — PROPOFOL 10 MG/ML IV BOLUS
INTRAVENOUS | Status: DC | PRN
  Administered 2015-02-27: 200 mg via INTRAVENOUS

## 2015-02-27 MED ORDER — MIDAZOLAM HCL 2 MG/2ML IJ SOLN
INTRAMUSCULAR | Status: DC | PRN
  Administered 2015-02-27: 2 mg via INTRAVENOUS

## 2015-02-27 MED ORDER — SODIUM CHLORIDE 0.9 % IR SOLN
Status: DC | PRN
  Administered 2015-02-27: 200 mL

## 2015-02-27 MED ORDER — FENTANYL CITRATE (PF) 100 MCG/2ML IJ SOLN
INTRAMUSCULAR | Status: DC | PRN
  Administered 2015-02-27: 25 ug via INTRAVENOUS

## 2015-02-27 MED ORDER — LACTATED RINGERS IV SOLN
INTRAVENOUS | Status: DC
  Administered 2015-02-27: 11:00:00 via INTRAVENOUS

## 2015-02-27 MED ORDER — LIDOCAINE HCL (CARDIAC) 20 MG/ML IV SOLN
INTRAVENOUS | Status: DC | PRN
  Administered 2015-02-27: 60 mg via INTRAVENOUS

## 2015-02-27 MED ORDER — OXYCODONE-ACETAMINOPHEN 5-325 MG PO TABS
1.0000 | ORAL_TABLET | ORAL | Status: AC | PRN
  Administered 2015-02-27: 1 via ORAL

## 2015-02-27 MED ORDER — CEFAZOLIN SODIUM 1-5 GM-% IV SOLN
INTRAVENOUS | Status: AC
  Administered 2015-02-27: 1 g via INTRAVENOUS
  Filled 2015-02-27: qty 50

## 2015-02-27 MED ORDER — OXYCODONE-ACETAMINOPHEN 5-325 MG PO TABS
ORAL_TABLET | ORAL | Status: AC
  Administered 2015-02-27: 1 via ORAL
  Filled 2015-02-27: qty 1

## 2015-02-27 SURGICAL SUPPLY — 26 items
BAG DRAIN CYSTO-URO LG1000N (MISCELLANEOUS) ×3 IMPLANT
CATH URETL 5X70 OPEN END (CATHETERS) ×3 IMPLANT
CONRAY 43 FOR UROLOGY 50M (MISCELLANEOUS) ×3 IMPLANT
GLOVE BIO SURGEON STRL SZ 6.5 (GLOVE) ×2 IMPLANT
GLOVE BIO SURGEON STRL SZ7 (GLOVE) ×6 IMPLANT
GLOVE BIO SURGEON STRL SZ7.5 (GLOVE) ×6 IMPLANT
GLOVE BIO SURGEONS STRL SZ 6.5 (GLOVE) ×1
GOWN STRL REUS W/ TWL LRG LVL3 (GOWN DISPOSABLE) ×2 IMPLANT
GOWN STRL REUS W/TWL LRG LVL3 (GOWN DISPOSABLE) ×4
JELLY LUB 2OZ STRL (MISCELLANEOUS) ×2
JELLY LUBE 2OZ STRL (MISCELLANEOUS) ×1 IMPLANT
KIT RM TURNOVER CYSTO AR (KITS) ×3 IMPLANT
PACK CYSTO AR (MISCELLANEOUS) ×3 IMPLANT
PREP PVP WINGED SPONGE (MISCELLANEOUS) ×3 IMPLANT
PUMP SINGLE ACTION SAP (PUMP) ×3 IMPLANT
SENSORWIRE 0.038 NOT ANGLED (WIRE) ×6
SET CYSTO W/LG BORE CLAMP LF (SET/KITS/TRAYS/PACK) ×3 IMPLANT
SOL .9 NS 3000ML IRR  AL (IV SOLUTION) ×2
SOL .9 NS 3000ML IRR UROMATIC (IV SOLUTION) ×1 IMPLANT
SOL PREP PVP 2OZ (MISCELLANEOUS) ×3
SOLUTION PREP PVP 2OZ (MISCELLANEOUS) ×1 IMPLANT
STENT URET 6FRX24 CONTOUR (STENTS) ×3 IMPLANT
STENT URET 6FRX26 CONTOUR (STENTS) ×3 IMPLANT
SYRINGE IRR TOOMEY STRL 70CC (SYRINGE) ×3 IMPLANT
WATER STERILE IRR 1000ML POUR (IV SOLUTION) ×3 IMPLANT
WIRE SENSOR 0.038 NOT ANGLED (WIRE) ×2 IMPLANT

## 2015-02-27 NOTE — H&P (Signed)
Tricia Brown 02/19/2015 8:15 AM Location: [facility] Patient #: [id number] DOB: 28-Aug-1976 Divorced / Language: [place] / Race: White Female    History of Present Illness(Tricia A McGowan, PA-C; 02/19/2015 10:13 AM) The patient is a 39 year old female presenting to discuss diagnostic procedure results. The patient had a CT scan (CT Urogram). The diagnostic test was performed on - Date: (02/14/2015). Current symptoms include other (gross hematuria and bilateral flank (L>R pain) and h/o recurrent UTIs/stones.). Note for "Follow up diagnostic procedure": CLINICAL DATA: Chronic kidney infections for 4 years. Gross hematuria for 1 week with nausea and vomiting history of cholecystectomy, complete hysterectomy and laparoscopic surgery. Initial encounter.  EXAM: CT ABDOMEN AND PELVIS WITHOUT AND WITH CONTRAST  TECHNIQUE: Multidetector CT imaging of the abdomen and pelvis was performed following the standard protocol before and following the bolus administration of intravenous contrast.  CONTRAST: OMNIPAQUE IOHEXOL 300 MG/ML SOLN  COMPARISON: Abdominal pelvic CT 11/19/2012.  FINDINGS: Lower chest: Clear lung bases. No significant pleural or pericardial effusion.  Hepatobiliary: The liver is normal in density without focal abnormality. 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 Previous cholecystectomy. No significant biliary dilatation.  Pancreas: Unremarkable. No pancreatic ductal dilatation or surrounding inflammatory changes.  Spleen: Normal in size without focal abnormality.  Adrenals/Urinary Tract: Both adrenal glands appear normal.Pre-contrast images demonstrate no renal, ureteral or bladder calculi. Post-contrast, both kidneys enhance normally. There is no evidence of enhancing renal mass. Delayed images result in segmental visualization of the ureters. No urothelial  abnormalities are identified. The bladder appears unremarkable.  Stomach/Bowel: No evidence of bowel wall thickening, distention or surrounding inflammatory change.Pill fragments are noted in the distal small bowel and transverse colon.  Vascular/Lymphatic: There are no enlarged abdominal or pelvic lymph nodes. No significant vascular findings are present.  Reproductive: No evidence of pelvic mass status post hysterectomy.  Other: No evidence of abdominal wall mass or hernia.  Musculoskeletal: No acute or significant osseous findings.  IMPRESSION: 1. No acute findings or explanation for hematuria or recurring urinary tract infections. 2. Stable appearance of the liver.   Electronically Signed By: Carey Bullocks M.D. On: 02/13/2015 12:32  Films are reviewed with the patient. Her ureters are not completely opacified on CT Urogram. Now that we are able to access EPIC, patient has had >9 CT of the abdomen and pelvis over the last few years.   She has a h/o recurrent UTIs. She was seen in the ED at [facility] and diagnosed with pyelonephritis in January.  Today, she states she is still experiencing gross hematuria. Her UA today is unremarkable. She is also having left flank pain and has left CVA tenderness on exam. She denies any fevers, chills, nausea and/or vomiting.   She had been managed by Dr. Sherron Monday and Dr. Eudelia Bunch in the past for IC. She was discharged from their practices for the manipulative behaviours surrounding narcotic pain medication and missing several appointments.      Problem List/Past Medical(Tricia Brown; 02/19/2015 8:27 AM) Chronic cystitis (595.2  N30.20) Incontinence (788.33  N39.46) Elevated blood pressure (796.2  R03.0) UTI (lower urinary tract infection) (599.0  N39.0). Patient's urine culture at [facility] was positive for E. coli on 09/29/2014. Her urine culture was negative on 10/08/2014 and 02/10/206 from our office  and the KUB ordered by me was negative for stones and it was completed on 10/11/2014. Patient states she was treated for 6 months through Dr.  MacDiarmid's office four years ago for this condition. I have received Dr. Mina Marble office. She was referred to Dr. Logan Bores for pelvic pain and manipulative behavior regarding her narcotic pain medications. She was receiving bladder instillations that seemed helpful, but she was not compliant with the f/u's due to transportation issues, family issues, etc. She still complains of mid back pain and now the starting of a lower left groin pain that she describes as a "sticking, stabbing pain." She had this pain symptom while she was seeing Dr. Sherron Monday. Gross hematuria (599.71  R31.0) Back pain (724.5  M54.9). Patient's insurance would not cover her Lidocaine patches. Urinary Frequency (788.41  R35.0). Patient's PVR vary between appointments. She is currently on Levsin and had samples of VESIcare which may explain the higher PVR today. Smoker (305.1  F17.200) Elevated LFTs (790.6  R79.89). Patient needs to she her PCP to follow up on her liver. Patient states she was seen by her PCP and that her liver was fine. Her bilirubin still remains elevated on today's exam. Stress Incontinence Female (625.6  N39.3) Flank pain (789.09  R10.9) Bladder spasms (596.89  N32.89)    Allergies(Tricia Brown; 02/19/2015 8:27 AM) No Known Drug Allergies. 03/13/2013    Family History(Tricia Brown; 02/19/2015 8:27 AM) Recurrent UTIs. Father. Breast cancer Hyperlipidemia Heart Disease Lung Cancer Hypertension Diabetes Mellitus    Social History(Tricia Brown; 02/19/2015 8:27 AM) Tobacco use. Current every day smoker, Has been smoking for 21 years, Smokes 1 pack of cigarettes per day. Alcohol use. Non-drinker.    Travel History(Tricia Brown; 02/19/2015 8:27 AM) Have you traveled internationally in the last 21 days?.  No.    Medication History(Tricia Brown; 02/19/2015 8:28 AM) Levsin/SL (0.125MG  Tab Sublingual, 1 (one) Sublingual every four hours, as needed, Taken starting 10/22/2014) Active. VESIcare (  Tablet, 1 (one) Tablet Oral by mouth once a day, Taken starting 01/21/2015) Active. Estrace (  Tablet, Oral) Active. TraMADol HCl ( Oral) Specific dose unknown - Active. Zofran ( Oral) Specific dose unknown - Active. Cranberry Extract (  Tablet, 1 Oral daily) Active. Loratadine (  Tablet, 1 Oral daily) Active. Multivitamins (1 Oral daily) Active. Promethazine HCl (  Tablet, 1 Oral daily) Active. Pseudoephedrine HCl (  Capsule ER, 1 Oral two times daily) Active. Trilipix (  Capsule DR, 1 Oral daily) Active. Vitamin D3 (5000UNIT Tablet, 1 Oral daily) Active. Medications Reconciled.    Past Surgical History(Tricia Brown; 02/19/2015 8:27 AM) Gallbladder Surgery Hysterectomy Laparoscopy. x 7    Other Problems(Tricia Brown; 02/19/2015 8:27 AM) Dysuria (788.1  R30.0) Unspecified Diagnosis    Review of Systems(Tricia Brown; 02/19/2015 8:27 AM) General:Not Present- Weight Gain > 10lbs.. Skin:Not Present- Hair Loss, Pruritus and Rash. HEENT:Not Present- Eye Pain, Decreased Hearing, Runny Nose, Snoring and Dry Mucous Membranes. Neck:Not Present- Neck Pain and Swollen Glands. Respiratory:Not Present- Chronic Cough, Difficulty Breathing on Exertion and Wakes up from Sleep Wheezing or Short of Breath. Cardiovascular:Not Present- Edema, Palpitations and Shortness of Breath. Gastrointestinal:Present- Diarrhea, Heartburnand Nausea. Not Present- Constipation. Female Genitourinary:Present- Change In Bladder Habits, Difficulty Emptying Bladder, Dysuria, Frequency, Urgencyand Excessive Urination at Night. Note:See HPI Musculoskeletal:Present- Back Pain. Not Present- Joint Pain. Neurological:Not Present- Decreased Memory, Headaches and  Seizures. Psychiatric:Not Present- Anxiety and Depression. Endocrine:Not Present- Excessive Sweating, Heat Intolerance and Tired/Sluggish. Hematology:Not Present- Abnormal Bleeding, Anemia, Blood Transfusion and Enlarged Lymph Nodes.    Vitals(Tricia Brown; 02/19/2015 8:28 AM) 02/19/2015 8:28 AM Weight: 155.38 lb Height: 66 in Height was reported by patient. Body Surface Area: 1.81 m Body Mass Index: 25.08 kg/m Pulse: 94 (Regular) BP:  118/85 (Sitting, Left Arm, Standard)     Physical Exam(Tricia A McGowan, PA-C; 02/19/2015 8:59 AM) The physical exam findings are as follows:   General Mental Status- Alert. Posture- Normal posture.   Integumentary General Characteristics:Overall examination of the patient's skin reveals - no rashes.   Head and Neck Head- normocephalic, atraumatic with no lesions or palpable masses. Neck Global Assessment- non-tender and no lymphadenopathy. Thyroid Gland Characteristics- normal size and consistency.   Eye Pupil- Left- Normal, Direct reaction to light normal, Equal, Regular and Round. Right- Normal, Direct reaction to light normal, Equal, Regular and Round. Bilateral- Normal, Direct reaction to light normal, Equal, Regular and Round.   ENMT Mouth and Throat Oral Cavity/Oropharynx:Teeth- no dentures.   Chest and Lung Exam Chest and lung exam reveals - normal excursion with symmetric chest walls, quiet, even and easy respiratory effort with no use of accessory muscles and on auscultation, normal breath sounds, no adventitious sounds and normal vocal resonance.   Cardiovascular Auscultation:Rhythm- Regular. Heart Sounds- S1 WNL and S2 WNL. Carotid arteries- No Carotid bruit.   Abdomen Inspection:Inspection of the abdomen reveals - No Hernias. Palpation/Percussion:Palpation and Percussion of the abdomen reveal - Soft, Non Tender, No Rebound tenderness, No Rigidity (guarding) and No  hepatosplenomegaly. Bladder- Non-palpable. Kidney (Left):Other Characteristics- Tender. Kidney (Right):Other Characteristics- Non Tender. Auscultation:Auscultation of the abdomen reveals - Bowel sounds normal.   Female Genitourinary- Did not examine.   Rectal- Did not examine.   Neurologic Neurologic evaluation reveals - alert and oriented x 3 with no impairment of recent or remote memory.   Neuropsychiatric Examination of related systems reveals - The patient is well-nourished and well-groomed. The patient's mood and affect are described as - normal.   Musculoskeletal Global Assessment Right Lower Extremity- normal strength and tone and normal range of motion without pain. Left Lower Extremity- normal strength and tone and normal range of motion without pain.   Lymphatic General Lymphatics Description- Normal .    Assessment & Plan(Tricia A McGowan, PA-C; 02/24/2015 8:20 PM) Gross hematuria (599.71  R31.0) Impression: Patient's ureters did not completely opacify on CT Urogram. We will need to schedule a cystoscopy with bilateral retrogrades to complete the hematuria work up. I described to the patient how the procedure is performed and the risks associated with the procedure, such as: infection, bleeding, uncomfortableness with the first few days after the procedure, the possibility of a biopsy of an area of concern in the ureters and/or stent placement. I also explained the risks of general anesthesia, such as: MI, CVA, paralysis, coma and/or death. Current Plans l Pt Education - How to access health information online: discussed with patient and provided information. l URINALYSIS, AUTOMATED W/ MICRO (81001) l URINE CULTURE, COMPREHENSIVE ([id number])   Signed electronically by Tricia BattiestShannon A McGowan, PA-C (02/24/2015 8:21 PM)

## 2015-02-27 NOTE — Discharge Instructions (Signed)
Cystoscopy, Care After   Refer to this sheet in the next few weeks. These instructions provide you with information on caring for yourself after your procedure. Your caregiver may also give you more specific instructions. Your treatment has been planned according to current medical practices, but problems sometimes occur. Call your caregiver if you have any problems or questions after your procedure.   HOME CARE INSTRUCTIONS   Things you can do to ease any discomfort after your procedure include:   Drinking enough water and fluids to keep your urine clear or pale yellow.   Taking a warm bath to relieve any burning feelings.  SEEK IMMEDIATE MEDICAL CARE IF:   You have an increase in blood in your urine.   You notice blood clots in your urine.   You have difficulty passing urine.   You have the chills.   You have abdominal pain.   You have a fever or persistent symptoms for more than 2-3 days.   You have a fever and your symptoms suddenly get worse.  MAKE SURE YOU:   Understand these instructions.   Will watch your condition.   Will get help right away if you are not doing well or get worse.  Document Released: 03/27/2005 Document Revised: 05/10/2013 Document Reviewed: 02/29/2012   ExitCare® Patient Information ©2015 [employer]. This information is not intended to replace advice given to you by your health care provider. Make sure you discuss any questions you have with your health care provider.

## 2015-02-27 NOTE — Anesthesia Preprocedure Evaluation (Addendum)
Anesthesia Evaluation  Patient identified by MRN, date of birth, ID band Patient awake    Reviewed: Allergy & Precautions, NPO status , Patient's Chart, lab work & pertinent test results  Airway Mallampati: I  TM Distance: >3 FB Neck ROM: Full    Dental  (+) Teeth Intact   Pulmonary Current Smoker,    Pulmonary exam normal       Cardiovascular Exercise Tolerance: Good Normal cardiovascular exam    Neuro/Psych    GI/Hepatic   Endo/Other    Renal/GU      Musculoskeletal   Abdominal Normal abdominal exam  (+)   Peds  Hematology   Anesthesia Other Findings   Reproductive/Obstetrics                            Anesthesia Physical Anesthesia Plan  ASA: II  Anesthesia Plan: General   Post-op Pain Management:    Induction: Intravenous  Airway Management Planned: LMA  Additional Equipment:   Intra-op Plan:   Post-operative Plan: Extubation in OR  Informed Consent: I have reviewed the patients History and Physical, chart, labs and discussed the procedure including the risks, benefits and alternatives for the proposed anesthesia with the patient or authorized representative who has indicated his/her understanding and acceptance.     Plan Discussed with: CRNA  Anesthesia Plan Comments:         Anesthesia Quick Evaluation

## 2015-02-27 NOTE — Transfer of Care (Signed)
Immediate Anesthesia Transfer of Care Note  Patient: Tricia Brown  Procedure(s) Performed: Procedure(s): CYSTOSCOPY WITH RETROGRADE PYELOGRAM (Bilateral)  Patient Location: PACU  Anesthesia Type:General  Level of Consciousness: awake, alert  and oriented  Airway & Oxygen Therapy: Patient Spontanous Breathing and Patient connected to face mask oxygen  Post-op Assessment: Report given to RN  Post vital signs: Reviewed and stable  Last Vitals:  Filed Vitals:   02/27/15 1213  BP: 110/73  Pulse: 70  Temp: 36.3 C  Resp: 12    Complications: No apparent anesthesia complications

## 2015-02-27 NOTE — Interval H&P Note (Signed)
History and Physical Interval Note:  02/27/2015 11:08 AM  Tricia Brown  has presented today for surgery, with the diagnosis of GROSS HEMATOMA  The various methods of treatment have been discussed with the patient and family. After consideration of risks, benefits and other options for treatment, the patient has consented to  Procedure(s): CYSTOSCOPY WITH STENT PLACEMENT/POSSIBLE STENT PLACEMENT (Bilateral) CYSTOSCOPY WITH RETROGRADE PYELOGRAM (Bilateral) as a surgical intervention .  The patient's history has been reviewed, patient examined, no change in status, stable for surgery.  I have reviewed the patient's chart and labs.  Questions were answered to the patient's satisfaction.    RRR CTAB   Vanna ScotlandAshley Kiyara Bouffard

## 2015-02-27 NOTE — Anesthesia Postprocedure Evaluation (Signed)
  Anesthesia Post-op Note  Patient: Tricia Brown  Procedure(s) Performed: Procedure(s): CYSTOSCOPY WITH RETROGRADE PYELOGRAM (Bilateral)  Anesthesia type:General  Patient location: PACU  Post pain: Pain level controlled  Post assessment: Post-op Vital signs reviewed, Patient's Cardiovascular Status Stable, Respiratory Function Stable, Patent Airway and No signs of Nausea or vomiting  Post vital signs: Reviewed and stable  Last Vitals:  Filed Vitals:   02/27/15 1213  BP: 110/73  Pulse: 70  Temp: 36.3 C  Resp: 12    Level of consciousness: awake, alert  and patient cooperative  Complications: No apparent anesthesia complications

## 2015-02-27 NOTE — Telephone Encounter (Signed)
[name] called from [facility] stating that Tricia Brown is not a pt at their location. The NP Kathrine Haddock is receiving medical records form Dr. Erlene Quan through [facility] contact # [phone] 02/27/15 MAF

## 2015-02-27 NOTE — Op Note (Signed)
Date of procedure: 02/27/2015  Preoperative diagnosis:  1. Gross hematuria 2. Pelvic pain   Postoperative diagnosis:  1. same as above   Procedure: 1. Cystoscopy 2. Bilateral retrograde pyelogram  Surgeon: Tricia ScotlandAshley Janina Trafton, MD  Anesthesia: General  Complications: None  Intraoperative findings: Normal bilateral retrogrades. Bladder is unremarkable.  EBL: Normal  Specimens: None  Drains: None  Indication: Tricia PodShannon Brown is a 39 y.o. patient with history of gross hematuria, smoker, who underwent CT urogram. Unfortunately, her ureters did not opacify completely therefore she presents today for cystoscopy, bilateral retrograde to complete her workup. Of note, she also has a history of pelvic pain.  After reviewing the management options for treatment, he elected to proceed with the above surgical procedure(s). We have discussed the potential benefits and risks of the procedure, side effects of the proposed treatment, the likelihood of the patient achieving the goals of the procedure, and any potential problems that might occur during the procedure or recuperation. Informed consent has been obtained.  Description of procedure:  The patient was taken to the operating room and general anesthesia was induced.  The patient was placed in the dorsal lithotomy position, prepped and draped in the usual sterile fashion, and preoperative antibiotics were administered. A preoperative time-out was performed.   At this point time, a 7222 French rigid cystoscope was advanced per urethra into the bladder. The bladder was carefully inspected at which time no evidence of tumors, lesions, or ulcerations were noted in the urothelium was noted to be normal. Bilateral ureteral orifices were identified with clear reflux of urine from both. Attention was then turned to the left ureteral orifice which was cannulated just within the UO using a 5 [place] open-ended ureteral catheter. A retrograde pyelogram was then  performed which revealed a delicate appearing ureter and relatively normal left collecting system.   There was no evidence of hydroureteronephrosis or filling defects. On the side, there was some pyelovenous backflow vs. small amount of extravasation likely from pressure from the retrograde.  Review of the CT urogram revealed a normal upper tract collecting system on the left side. Attention was then turned to the right ureteral orifice which was cannulated using a 5 [place] open-ended ureteral catheter. Again a retrograde pyelogram was performed revealing a delicate appearing ureter, a decompressed kidney without evidence of hydroureteronephrosis or filling defects. The bladder was then drained and the scope was removed. The patient was reversed from anesthesia, taken the PACU in stable condition.    Tricia ScotlandAshley Jorryn Brown, M.D.

## 2015-02-27 NOTE — Anesthesia Procedure Notes (Signed)
Procedure Name: LMA Insertion Date/Time: 02/27/2015 11:30 AM Performed by: Lily KocherPERALTA, Blayklee Mable Pre-anesthesia Checklist: Patient identified Patient Re-evaluated:Patient Re-evaluated prior to inductionOxygen Delivery Method: Circle system utilized Preoxygenation: Pre-oxygenation with 100% oxygen Intubation Type: IV induction Ventilation: Mask ventilation without difficulty LMA: LMA inserted LMA Size: 4.0 Placement Confirmation: positive ETCO2 Tube secured with: Tape Dental Injury: Teeth and Oropharynx as per pre-operative assessment

## 2015-02-27 NOTE — Brief Op Note (Signed)
02/27/2015  12:01 PM  PATIENT:  Tricia Brown  39 y.o. female  PRE-OPERATIVE DIAGNOSIS:  GROSS HEMATURIA  POST-OPERATIVE DIAGNOSIS:  GROSS HEMATURIA   PROCEDURE:  Procedure(s): CYSTOSCOPY WITH RETROGRADE PYELOGRAM (Bilateral)  SURGEON:  Surgeon(s) and Role:    * Vanna ScotlandAshley Esaw Knippel, MD - Primary  ASSISTANTS: none   ANESTHESIA:   general  EBL:  Total I/O In: 500 [I.V.:500] Out: -   Drains: none  Specimen: none  COUNTS CORRECT: YES  PLAN OF CARE: Discharge to home after PACU  PATIENT DISPOSITION:  PACU - hemodynamically stable.

## 2015-02-28 ENCOUNTER — Telehealth: Payer: Self-pay | Admitting: Urology

## 2015-02-28 DIAGNOSIS — R109 Unspecified abdominal pain: Secondary | ICD-10-CM

## 2015-02-28 NOTE — Telephone Encounter (Signed)
Patient called the office and left a message in regards to urinating clots and having pain after surgery. I called patient back in regards to this. She states she is having amber colored urine with some blood clots. She is having a lot of pelvic/bladder pain and some left flank pain.  She has tried NSAIDS and Tramadol (an old script that she had on hand) and this has not helped with her pain. I stated that due to it being so late I would not have a response until tomorrow and if her pain worsened to go to the ER or if she started passing thick clotted blood. Patient states that she understands, she was also instructed to continue taking her bladder spasm medication and to increase her water intake.

## 2015-03-01 MED ORDER — HYDROCODONE-ACETAMINOPHEN 5-325 MG PO TABS: 1.0000 | ORAL_TABLET | Freq: Four times a day (QID) | ORAL | Status: DC | PRN

## 2015-03-01 NOTE — Telephone Encounter (Signed)
Spoke with pt and made her aware of Dr. Delana Meyer orders. Pt voiced understanding stating she will come by the office today to pick up rx. Cw,lpn

## 2015-03-01 NOTE — Telephone Encounter (Signed)
Please continue to reassure the patient.  Her pain should resolve and the little bit of bleeding is from the irritation of the cysto.  Please let her know if she needs a few pain tabs for the weekend, we will dispense #5 with NO refills.    She should let us know if she has fevers, chills, or burning with urination.    Vanna Scotland, MD

## 2016-03-09 ENCOUNTER — Other Ambulatory Visit: Payer: Self-pay | Admitting: Urology

## 2016-05-29 ENCOUNTER — Encounter (HOSPITAL_COMMUNITY): Payer: Self-pay | Admitting: Emergency Medicine

## 2016-05-29 ENCOUNTER — Ambulatory Visit (HOSPITAL_COMMUNITY)
Admission: EM | Admit: 2016-05-29 | Discharge: 2016-05-29 | Disposition: A | Discharge status: Home / Self Care | Payer: Medicaid Other

## 2016-05-29 DIAGNOSIS — K029 Dental caries, unspecified: Secondary | ICD-10-CM

## 2016-05-29 MED ORDER — DICLOFENAC POTASSIUM 50 MG PO TABS: 50.0000 mg | ORAL_TABLET | Freq: Three times a day (TID) | ORAL | 0 refills | Status: DC

## 2016-05-29 MED ORDER — CLINDAMYCIN HCL 150 MG PO CAPS: 150.0000 mg | ORAL_CAPSULE | Freq: Four times a day (QID) | ORAL | 0 refills | Status: DC

## 2016-05-29 NOTE — Discharge Instructions (Signed)
Take medicine as prescribed, see your dentist as soon as possible °
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
## 2016-05-29 NOTE — ED Triage Notes (Signed)
Patient has dental pain, left face and left ear pain for more than a week.  Patient wasn't information in regards to dental clinics.

## 2016-11-27 DIAGNOSIS — F329 Major depressive disorder, single episode, unspecified: Secondary | ICD-10-CM | POA: Insufficient documentation

## 2017-06-01 ENCOUNTER — Other Ambulatory Visit: Payer: Self-pay | Admitting: Family Medicine

## 2017-06-01 DIAGNOSIS — Z1231 Encounter for screening mammogram for malignant neoplasm of breast: Secondary | ICD-10-CM

## 2017-06-11 ENCOUNTER — Ambulatory Visit: Payer: Self-pay

## 2017-06-18 ENCOUNTER — Ambulatory Visit
Admission: RE | Admit: 2017-06-18 | Discharge: 2017-06-18 | Disposition: A | Discharge status: Home / Self Care | Payer: Medicaid Other | Source: Ambulatory Visit | Attending: Family Medicine | Admitting: Family Medicine

## 2017-06-18 DIAGNOSIS — Z1231 Encounter for screening mammogram for malignant neoplasm of breast: Secondary | ICD-10-CM

## 2019-02-01 ENCOUNTER — Emergency Department (HOSPITAL_COMMUNITY)
Admission: EM | Admit: 2019-02-01 | Discharge: 2019-02-02 | Disposition: A | Discharge status: Other Acute Inpatient Hospital | Payer: Medicaid Other | Attending: Emergency Medicine | Admitting: Emergency Medicine

## 2019-02-01 ENCOUNTER — Encounter (HOSPITAL_COMMUNITY): Payer: Self-pay

## 2019-02-01 ENCOUNTER — Other Ambulatory Visit: Payer: Self-pay

## 2019-02-01 DIAGNOSIS — Z1159 Encounter for screening for other viral diseases: Secondary | ICD-10-CM | POA: Insufficient documentation

## 2019-02-01 DIAGNOSIS — F329 Major depressive disorder, single episode, unspecified: Secondary | ICD-10-CM | POA: Insufficient documentation

## 2019-02-01 DIAGNOSIS — F419 Anxiety disorder, unspecified: Secondary | ICD-10-CM | POA: Insufficient documentation

## 2019-02-01 DIAGNOSIS — F22 Delusional disorders: Secondary | ICD-10-CM | POA: Insufficient documentation

## 2019-02-01 DIAGNOSIS — Z046 Encounter for general psychiatric examination, requested by authority: Secondary | ICD-10-CM | POA: Insufficient documentation

## 2019-02-01 DIAGNOSIS — R441 Visual hallucinations: Secondary | ICD-10-CM | POA: Insufficient documentation

## 2019-02-01 DIAGNOSIS — F17210 Nicotine dependence, cigarettes, uncomplicated: Secondary | ICD-10-CM | POA: Insufficient documentation

## 2019-02-01 DIAGNOSIS — Z79899 Other long term (current) drug therapy: Secondary | ICD-10-CM | POA: Insufficient documentation

## 2019-02-01 DIAGNOSIS — R440 Auditory hallucinations: Secondary | ICD-10-CM | POA: Insufficient documentation

## 2019-02-01 DIAGNOSIS — R45851 Suicidal ideations: Secondary | ICD-10-CM | POA: Insufficient documentation

## 2019-02-01 DIAGNOSIS — R45850 Homicidal ideations: Secondary | ICD-10-CM | POA: Insufficient documentation

## 2019-02-01 DIAGNOSIS — R451 Restlessness and agitation: Secondary | ICD-10-CM | POA: Insufficient documentation

## 2019-02-01 NOTE — ED Notes (Signed)
Bed: [facility] Expected date:  Expected time:  Means of arrival:  Comments: 

## 2019-02-01 NOTE — ED Triage Notes (Signed)
Children-  Jeraldine Loots [phone] [place] [phone]

## 2019-02-01 NOTE — ED Triage Notes (Signed)
Pt is here voluntarily brought by her children, they state that she's hallucinating and very paranoid for the last two days, her mother died recently and Mothers Day was difficult. Pt haas had suicidal ideation, crying a lot, falling in the floor and thinking she's the anti-Christ

## 2019-02-02 ENCOUNTER — Inpatient Hospital Stay (HOSPITAL_COMMUNITY)
Admission: AD | Admit: 2019-02-02 | Discharge: 2019-02-10 | DRG: 885 | Disposition: A | Discharge status: Home / Self Care | Payer: Medicaid Other | Source: Intra-hospital | Attending: Psychiatry | Admitting: Psychiatry

## 2019-02-02 ENCOUNTER — Other Ambulatory Visit: Payer: Self-pay

## 2019-02-02 ENCOUNTER — Encounter (HOSPITAL_COMMUNITY): Payer: Self-pay

## 2019-02-02 DIAGNOSIS — F333 Major depressive disorder, recurrent, severe with psychotic symptoms: Secondary | ICD-10-CM | POA: Diagnosis not present

## 2019-02-02 DIAGNOSIS — F1123 Opioid dependence with withdrawal: Secondary | ICD-10-CM | POA: Diagnosis present

## 2019-02-02 DIAGNOSIS — F17210 Nicotine dependence, cigarettes, uncomplicated: Secondary | ICD-10-CM | POA: Diagnosis present

## 2019-02-02 DIAGNOSIS — F1120 Opioid dependence, uncomplicated: Secondary | ICD-10-CM

## 2019-02-02 DIAGNOSIS — Z1159 Encounter for screening for other viral diseases: Secondary | ICD-10-CM

## 2019-02-02 DIAGNOSIS — F1914 Other psychoactive substance abuse with psychoactive substance-induced mood disorder: Secondary | ICD-10-CM | POA: Diagnosis present

## 2019-02-02 DIAGNOSIS — K580 Irritable bowel syndrome with diarrhea: Secondary | ICD-10-CM

## 2019-02-02 DIAGNOSIS — F419 Anxiety disorder, unspecified: Secondary | ICD-10-CM | POA: Diagnosis present

## 2019-02-02 DIAGNOSIS — G4700 Insomnia, unspecified: Secondary | ICD-10-CM | POA: Diagnosis present

## 2019-02-02 DIAGNOSIS — F332 Major depressive disorder, recurrent severe without psychotic features: Secondary | ICD-10-CM | POA: Diagnosis present

## 2019-02-02 DIAGNOSIS — Z23 Encounter for immunization: Secondary | ICD-10-CM

## 2019-02-02 DIAGNOSIS — F32A Depression, unspecified: Secondary | ICD-10-CM | POA: Diagnosis present

## 2019-02-02 DIAGNOSIS — R45851 Suicidal ideations: Secondary | ICD-10-CM | POA: Diagnosis present

## 2019-02-02 DIAGNOSIS — Z8614 Personal history of Methicillin resistant Staphylococcus aureus infection: Secondary | ICD-10-CM | POA: Diagnosis not present

## 2019-02-02 DIAGNOSIS — F329 Major depressive disorder, single episode, unspecified: Secondary | ICD-10-CM | POA: Diagnosis present

## 2019-02-02 DIAGNOSIS — F1523 Other stimulant dependence with withdrawal: Secondary | ICD-10-CM | POA: Diagnosis not present

## 2019-02-02 DIAGNOSIS — F1924 Other psychoactive substance dependence with psychoactive substance-induced mood disorder: Secondary | ICD-10-CM | POA: Diagnosis not present

## 2019-02-02 LAB — COMPREHENSIVE METABOLIC PANEL
ALT: 30 U/L (ref 0–44)
AST: 25 U/L (ref 15–41)
Albumin: 4.3 g/dL (ref 3.5–5.0)
Alkaline Phosphatase: 127 U/L — ABNORMAL HIGH (ref 38–126)
Anion gap: 12 (ref 5–15)
BUN: 5 mg/dL — ABNORMAL LOW (ref 6–20)
CO2: 20 mmol/L — ABNORMAL LOW (ref 22–32)
Calcium: 9.1 mg/dL (ref 8.9–10.3)
Chloride: 103 mmol/L (ref 98–111)
Creatinine, Ser: 0.84 mg/dL (ref 0.44–1.00)
GFR calc Af Amer: >60 mL/min (ref >60)
GFR calc non Af Amer: >60 mL/min (ref >60)
Glucose, Bld: 80 mg/dL (ref 70–99)
Potassium: 2.7 mmol/L — CL (ref 3.5–5.1)
Sodium: 135 mmol/L (ref 135–145)
Total Bilirubin: 0.5 mg/dL (ref 0.3–1.2)
Total Protein: 7.2 g/dL (ref 6.5–8.1)

## 2019-02-02 LAB — CBC
HCT: 41.4 % (ref 36.0–46.0)
Hemoglobin: 13.9 g/dL (ref 12.0–15.0)
MCH: 32.8 pg (ref 26.0–34.0)
MCHC: 33.6 g/dL (ref 30.0–36.0)
MCV: 97.6 fL (ref 80.0–100.0)
Platelets: 294 10*3/uL (ref 150–400)
RBC: 4.24 MIL/uL (ref 3.87–5.11)
RDW: 12.9 % (ref 11.5–15.5)
WBC: 16.7 10*3/uL — ABNORMAL HIGH (ref 4.0–10.5)
nRBC: 0 % (ref 0.0–0.2)

## 2019-02-02 LAB — ETHANOL: Alcohol, Ethyl (B): <10 mg/dL (ref <10)

## 2019-02-02 LAB — RAPID URINE DRUG SCREEN, HOSP PERFORMED
Amphetamines: NOT DETECTED
Barbiturates: NOT DETECTED
Benzodiazepines: NOT DETECTED
Cocaine: NOT DETECTED
Opiates: NOT DETECTED
Tetrahydrocannabinol: POSITIVE — AB

## 2019-02-02 LAB — SALICYLATE LEVEL: Salicylate Lvl: <7 mg/dL (ref 2.8–30.0)

## 2019-02-02 LAB — I-STAT BETA HCG BLOOD, ED (MC, WL, AP ONLY): I-stat hCG, quantitative: <5 m[IU]/mL (ref <5)

## 2019-02-02 LAB — SARS CORONAVIRUS 2 BY RT PCR (HOSPITAL ORDER, PERFORMED IN ~~LOC~~ HOSPITAL LAB): SARS Coronavirus 2: NEGATIVE

## 2019-02-02 LAB — ACETAMINOPHEN LEVEL: Acetaminophen (Tylenol), Serum: <10 ug/mL — ABNORMAL LOW (ref 10–30)

## 2019-02-02 MED ORDER — LORAZEPAM 1 MG PO TABS
2.0000 mg | ORAL_TABLET | Freq: Once | ORAL | Status: AC
  Administered 2019-02-02: 01:00:00 2 mg via ORAL
  Filled 2019-02-02: qty 2

## 2019-02-02 MED ORDER — PRAZOSIN HCL 2 MG PO CAPS
2.0000 mg | ORAL_CAPSULE | Freq: Every day | ORAL | Status: DC
  Administered 2019-02-02 – 2019-02-04 (×3): 2 mg via ORAL
  Filled 2019-02-02 (×5): qty 1

## 2019-02-02 MED ORDER — POTASSIUM CHLORIDE CRYS ER 20 MEQ PO TBCR
40.0000 meq | EXTENDED_RELEASE_TABLET | Freq: Once | ORAL | Status: AC
  Administered 2019-02-02: 40 meq via ORAL
  Filled 2019-02-02: qty 2

## 2019-02-02 MED ORDER — NICOTINE 21 MG/24HR TD PT24
21.0000 mg | MEDICATED_PATCH | Freq: Every day | TRANSDERMAL | Status: DC
  Administered 2019-02-02 – 2019-02-09 (×6): 21 mg via TRANSDERMAL
  Filled 2019-02-02 (×10): qty 1

## 2019-02-02 MED ORDER — METOPROLOL SUCCINATE ER 50 MG PO TB24
50.0000 mg | ORAL_TABLET | Freq: Every day | ORAL | Status: DC
  Administered 2019-02-02 – 2019-02-05 (×4): 50 mg via ORAL
  Filled 2019-02-02 (×6): qty 1

## 2019-02-02 MED ORDER — VORTIOXETINE HBR 10 MG PO TABS
10.0000 mg | ORAL_TABLET | Freq: Every day | ORAL | Status: DC
  Administered 2019-02-02 – 2019-02-05 (×4): 10 mg via ORAL
  Filled 2019-02-02 (×6): qty 1

## 2019-02-02 MED ORDER — ALUM & MAG HYDROXIDE-SIMETH 200-200-20 MG/5ML PO SUSP: 30.0000 mL | ORAL | Status: DC | PRN

## 2019-02-02 MED ORDER — ESTRADIOL 1 MG PO TABS
2.0000 mg | ORAL_TABLET | Freq: Two times a day (BID) | ORAL | Status: DC
  Administered 2019-02-02 – 2019-02-09 (×15): 2 mg via ORAL
  Filled 2019-02-02: qty 1
  Filled 2019-02-02 (×3): qty 2
  Filled 2019-02-02: qty 56
  Filled 2019-02-02: qty 2
  Filled 2019-02-02: qty 1
  Filled 2019-02-02: qty 56
  Filled 2019-02-02: qty 1
  Filled 2019-02-02: qty 56
  Filled 2019-02-02: qty 1
  Filled 2019-02-02 (×3): qty 2
  Filled 2019-02-02: qty 56
  Filled 2019-02-02: qty 2
  Filled 2019-02-02: qty 1
  Filled 2019-02-02: qty 2
  Filled 2019-02-02 (×3): qty 1
  Filled 2019-02-02: qty 2

## 2019-02-02 MED ORDER — ACETAMINOPHEN 325 MG PO TABS
650.0000 mg | ORAL_TABLET | Freq: Four times a day (QID) | ORAL | Status: DC | PRN
  Administered 2019-02-04 – 2019-02-10 (×5): 650 mg via ORAL
  Filled 2019-02-02 (×5): qty 2

## 2019-02-02 MED ORDER — MAGNESIUM HYDROXIDE 400 MG/5ML PO SUSP: 30.0000 mL | Freq: Every day | ORAL | Status: DC | PRN

## 2019-02-02 MED ORDER — BUSPIRONE HCL 15 MG PO TABS
15.0000 mg | ORAL_TABLET | Freq: Three times a day (TID) | ORAL | Status: DC
  Administered 2019-02-02 – 2019-02-05 (×9): 15 mg via ORAL
  Filled 2019-02-02 (×14): qty 1

## 2019-02-02 MED ORDER — TEMAZEPAM 30 MG PO CAPS
30.0000 mg | ORAL_CAPSULE | Freq: Every day | ORAL | Status: DC
  Administered 2019-02-02: 30 mg via ORAL
  Filled 2019-02-02: qty 1

## 2019-02-02 MED ORDER — VALACYCLOVIR HCL 500 MG PO TABS
500.0000 mg | ORAL_TABLET | Freq: Every day | ORAL | Status: DC
  Administered 2019-02-02 – 2019-02-09 (×8): 500 mg via ORAL
  Filled 2019-02-02 (×6): qty 1
  Filled 2019-02-02: qty 14
  Filled 2019-02-02 (×3): qty 1
  Filled 2019-02-02: qty 14

## 2019-02-02 MED ORDER — ARIPIPRAZOLE 2 MG PO TABS
2.0000 mg | ORAL_TABLET | Freq: Every day | ORAL | Status: DC
  Administered 2019-02-02 – 2019-02-09 (×8): 2 mg via ORAL
  Filled 2019-02-02 (×2): qty 14
  Filled 2019-02-02 (×9): qty 1

## 2019-02-02 MED ORDER — PNEUMOCOCCAL VAC POLYVALENT 25 MCG/0.5ML IJ INJ
0.5000 mL | INJECTION | INTRAMUSCULAR | Status: AC
  Administered 2019-02-03: 12:00:00 0.5 mL via INTRAMUSCULAR

## 2019-02-02 NOTE — BHH Counselor (Signed)
Pending Covid testing & medical clearance, pt is accepted to [facility]. [facility] will coordinate.

## 2019-02-02 NOTE — ED Notes (Signed)
Nad, tearful at times, talkative.

## 2019-02-02 NOTE — Tx Team (Signed)
Initial Treatment Plan 02/02/2019 3:26 PM [facility] [id number]    PATIENT STRESSORS: Financial difficulties Health problems Marital or family conflict Occupational concerns Substance abuse Traumatic event   PATIENT STRENGTHS: Ability for insight Active sense of humor Average or above average intelligence Capable of independent living Communication skills General fund of knowledge Motivation for treatment/growth Supportive family/friends   PATIENT IDENTIFIED PROBLEMS: "grieving over my mom's death"  "anxiety/depression about everything crashing down"                   DISCHARGE CRITERIA:  Ability to meet basic life and health needs Improved stabilization in mood, thinking, and/or behavior Reduction of life-threatening or endangering symptoms to within safe limits  PRELIMINARY DISCHARGE PLAN: Attend aftercare/continuing care group Attend PHP/IOP Outpatient therapy Participate in family therapy Return to previous living arrangement  PATIENT/FAMILY INVOLVEMENT: This treatment plan has been presented to and reviewed with the patient, Tricia Brown.  The patient and family have been given the opportunity to ask questions and make suggestions.  Raylene Miyamoto, RN 02/02/2019, 3:26 PM
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
## 2019-02-02 NOTE — ED Notes (Signed)
Pt off unit to [facility] per MD. Pt calm, cooperative, no s/s of distress. Pt DC information given to Juel Burrow transport person for [facility]. Pt belongings given to Juel Burrow transport person for [facility]. Pt ambulatory off unit with tech. Pt transported by [facility] to [facility].

## 2019-02-02 NOTE — [facility] Group Notes (Signed)
[facility] LCSW Group Therapy Note  Date/Time: 02/02/19, 1315  Type of Therapy/Topic:  Group Therapy:  Balance in Life  Participation Level:  Did not attend  Description of Group:    This group will address the concept of balance and how it feels and looks when one is unbalanced. Patients will be encouraged to process areas in their lives that are out of balance, and identify reasons for remaining unbalanced. Facilitators will guide patients utilizing problem- solving interventions to address and correct the stressor making their life unbalanced. Understanding and applying boundaries will be explored and addressed for obtaining  and maintaining a balanced life. Patients will be encouraged to explore ways to assertively make their unbalanced needs known to significant others in their lives, using other group members and facilitator for support and feedback.  Therapeutic Goals: 1. Patient will identify two or more emotions or situations they have that consume much of in their lives. 2. Patient will identify signs/triggers that life has become out of balance:  3. Patient will identify two ways to set boundaries in order to achieve balance in their lives:  4. Patient will demonstrate ability to communicate their needs through discussion and/or role plays  Summary of Patient Progress:          Therapeutic Modalities:   Cognitive Behavioral Therapy Solution-Focused Therapy Assertiveness Training  Greg Christos Mixson, LCSW 

## 2019-02-02 NOTE — ED Notes (Signed)
Pt agitated. Pt paranoid, pt reports her mind isn't right and she needs help. Pt yelling and refuses to stay in room. Sitter in hallway at this time.pt safe will continue to monitor.

## 2019-02-02 NOTE — ED Notes (Signed)
Pt continues to have loud random outbursts, disturbing the other patients

## 2019-02-02 NOTE — Progress Notes (Addendum)
Received Tricia Brown from the main ED at 2330 hrs, alert and after a few minutes she ran out of the room and said she have to leave. She was redirected back to her room. Once again she ran out of her room and said she must go. She  was screaming she is black and proud. She was redirected to lower her voice. She was given liquids to drink.  She continues to scream out at intervals, but redirectable.

## 2019-02-02 NOTE — ED Notes (Signed)
Tom CSW into see 
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
## 2019-02-02 NOTE — ED Notes (Signed)
I put 2 patient belonging bags in locker 33

## 2019-02-02 NOTE — ED Notes (Signed)
Pt has ran out of her room twice trying to leave. Pt says "I have to go to "[place]".  Pt is redirectable.

## 2019-02-02 NOTE — BH Assessment (Signed)
Notified Primary RN of pt acceptance.

## 2019-02-02 NOTE — ED Notes (Signed)
Tricia Brown, [place], patient meets inpatient criteria. TTS to secure placement. Melina Schools, RN, informed of disposition.

## 2019-02-02 NOTE — ED Notes (Signed)
Pt resting quietly, anxious, tearful at times.  Pt denies si/hi/avh at this time and reports that he hallucinations only occur when she is at home.  Pt reports that her mother recently died and she has been trying to protect her(Mom'S) acessets for her family.  Pt reports that she has been trying to work with her family. Pt also reports that she wants to get started with grief counseling to help her work thru her mothers death.

## 2019-02-02 NOTE — ED Notes (Signed)
Report called to Selena Batten RN at [facility]

## 2019-02-02 NOTE — BHH Suicide Risk Assessment (Signed)
[facility] Admission Suicide Risk Assessment   Nursing information obtained from:  Patient Demographic factors:  Divorced or widowed, Low socioeconomic status, Caucasian Current Mental Status:  Suicidal ideation indicated by patient, Self-harm thoughts Loss Factors:  Decrease in vocational status, Decline in physical health, Financial problems / change in socioeconomic status, Loss of significant relationship Historical Factors:  Family history of mental illness or substance abuse, Impulsivity, Anniversary of important loss Risk Reduction Factors:  Sense of responsibility to family, Positive social support, Positive coping skills or problem solving skills, Responsible for children under 58 years of age, Living with another person, especially a relative, Positive therapeutic relationship  Total Time spent with patient: 45 minutes Principal Problem: <principal problem not specified> Diagnosis:  Active Problems:   Depression   MDD (major depressive disorder), recurrent episode, severe ([facility])  Subjective Data: Severe depression with psychotic features post death of mother 19-Dec-2022 Continued Clinical Symptoms:    The "Alcohol Use Disorders Identification Test", Guidelines for Use in Primary Care, Second Edition.  World Science writer [facility]). Score between 0-7:  no or low risk or alcohol related problems. Score between 8-15:  moderate risk of alcohol related problems. Score between 16-19:  high risk of alcohol related problems. Score 20 or above:  warrants further diagnostic evaluation for alcohol dependence and treatment.   CLINICAL FACTORS:   Depression:   Anhedonia    COGNITIVE FEATURES THAT CONTRIBUTE TO RISK:  None    SUICIDE RISK:   Minimal: No identifiable suicidal ideation.  Patients presenting with no risk factors but with morbid ruminations; may be classified as minimal risk based on the severity of the depressive symptoms  PLAN OF CARE: see eval   I certify that inpatient  services furnished can reasonably be expected to improve the patient's condition.   Malvin Johns, MD 02/02/2019, 3:13 PM

## 2019-02-02 NOTE — BH Assessment (Signed)
[facility] Assessment Progress Note  Per Juanetta Beets, DO, this pt requires psychiatric hospitalization at this time.  Malva Limes, RN, [facility] has tentatively assigned pt to [facility] Rm 504-1, pending pt being swabbed for Covid-19 testing.  Pt has signed Voluntary Admission and Consent for Treatment, as well as Consent to Release Information to no one, and signed forms have been faxed to [facility].  Pt's nurse, Wille Celeste, has been notified, and agrees to send original paperwork along with pt via Juel Burrow, and to call report to [phone].  Doylene Canning, [place] [facility] Coordinator [phone]
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
## 2019-02-02 NOTE — ED Notes (Signed)
[facility] contacted for transport 

## 2019-02-03 MED ORDER — TEMAZEPAM 15 MG PO CAPS
45.0000 mg | ORAL_CAPSULE | Freq: Every day | ORAL | Status: DC
  Administered 2019-02-03 – 2019-02-04 (×2): 45 mg via ORAL
  Filled 2019-02-03 (×2): qty 3

## 2019-02-03 MED ORDER — HYDROXYZINE HCL 25 MG PO TABS
25.0000 mg | ORAL_TABLET | Freq: Three times a day (TID) | ORAL | Status: DC | PRN
  Administered 2019-02-03 – 2019-02-09 (×11): 25 mg via ORAL
  Filled 2019-02-03 (×4): qty 1
  Filled 2019-02-03: qty 20
  Filled 2019-02-03 (×7): qty 1

## 2019-02-03 MED ORDER — LOPERAMIDE HCL 2 MG PO CAPS
2.0000 mg | ORAL_CAPSULE | ORAL | Status: DC | PRN
  Administered 2019-02-03 – 2019-02-05 (×8): 2 mg via ORAL
  Filled 2019-02-03 (×9): qty 1

## 2019-02-03 NOTE — Progress Notes (Signed)
DAR NOTE: Patient presents with anxious affect and depressed mood.  Denies suicidal thoughts, pain, auditory and visual hallucinations.  Described energy level as low and concentration as good.  Rates depression at 7, hopelessness at 5, and anxiety at 10.  Maintained on routine safety checks.  Medications given as prescribed.  Support and encouragement offered as needed.  Attended group and participated.  States goal for today is "learning to take care of myself."  Patient visible in milieu with minimal interaction.

## 2019-02-03 NOTE — Progress Notes (Signed)
D: Patient observed cautious on approach.  Patient states, "I've just had a breakdown. My mom died and I hear her voice at times. It's not loud. I'm not seeing anything right now I shouldn't be. I have felt hopeless and have thought about suicide but I won't leave my son. I would never try to harm myself. I really feel I need a therapist. My family tries to help but they are going through their own grief." Patient's affect anxious, tearful, depressed with congruent mood. Denies pain, physical complaints. COVID-19 screen negative, afebrile. Respiratory assessment WDL.  A: Medicated per orders, no prns requested or needed. Medication education provided. Level III obs in place for safety. Emotional support offered. Patient encouraged to complete Suicide Safety Plan before discharge. Encouraged to attend and participate in unit programming.  Fall prevention plan in place and reviewed with patient as pt is a high fall risk.   R: Patient verbalizes understanding of POC, falls prevention education. Patient denies SI/HI and remains safe on level III obs. Will continue to monitor throughout the night.

## 2019-02-03 NOTE — Progress Notes (Signed)
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, Actuary)  Coping Skills:   Isolation, TV, Arguments, Music, Exercise, Deep Breathing, Substance Abuse, Impulsivity, Talk, Prayer, Art  Leisure Interests (2+):  Community - Other (Comment)([place]pa)  Frequency of Recreation/Participation: Other (Comment)(Pt stated it has been a long time.)  Awareness of Community Resources:  Yes  Community Resources:  [place], [place]  Current Use: Yes  If no, Barriers?:    Expressed Interest in [employer] Information: No  [employer] of Residence:  Engineer, technical sales  Patient Main Form of Transportation: Set designer  Patient Strengths:  Firefighter caregiver; Good heart  Patient Identified Areas of Improvement:  Get feelings out and talk more  Patient Goal for Hospitalization:  "to improve self: mind, body and soul"  Current SI (including self-harm):  No  Current HI:  No  Current AVH: No  Staff Intervention Plan: Group Attendance, Collaborate with Interdisciplinary Treatment Team  Consent to Intern Participation: N/A    Caroll Rancher, LRT/CTRS  Lillia Abed, [name] 02/03/2019, 1:16 PM
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
## 2019-02-03 NOTE — Tx Team (Signed)
Interdisciplinary Treatment and Diagnostic Plan Update  02/03/2019 Time of Session: [name] MRN: [id number]  Principal Diagnosis: <principal problem not specified>  Secondary Diagnoses: Active Problems:   Depression   MDD (major depressive disorder), recurrent episode, severe (HCC)   Current Medications:  Current Facility-Administered Medications  Medication Dose Route Frequency Provider Last Rate Last Dose  . acetaminophen (TYLENOL) tablet 650 mg  650 mg Oral Q6H PRN Johnn Hai, MD      . alum & mag hydroxide-simeth (MAALOX/MYLANTA) 200-200-20 MG/5ML suspension 30 mL  30 mL Oral Q4H PRN Johnn Hai, MD      . ARIPiprazole (ABILIFY) tablet 2 mg  2 mg Oral Daily Johnn Hai, MD   2 mg at 02/03/19 0804  . busPIRone (BUSPAR) tablet 15 mg  15 mg Oral TID Johnn Hai, MD   15 mg at 02/03/19 0804  . estradiol (ESTRACE) tablet 2 mg  2 mg Oral BID Johnn Hai, MD   2 mg at 02/03/19 0804  . hydrOXYzine (ATARAX/VISTARIL) tablet 25 mg  25 mg Oral TID PRN Patriciaann Clan E, PA-C   25 mg at 02/03/19 0313  . magnesium hydroxide (MILK OF MAGNESIA) suspension 30 mL  30 mL Oral Daily PRN Johnn Hai, MD      . metoprolol succinate (TOPROL-XL) 24 hr tablet 50 mg  50 mg Oral Daily Johnn Hai, MD   50 mg at 02/03/19 0804  . nicotine (NICODERM CQ - dosed in mg/24 hours) patch 21 mg  21 mg Transdermal Daily Johnn Hai, MD   21 mg at 02/02/19 1849  . pneumococcal 23 valent vaccine (PNU-IMMUNE) injection 0.5 mL  0.5 mL Intramuscular Tomorrow-1000 Johnn Hai, MD      . prazosin (MINIPRESS) capsule 2 mg  2 mg Oral QHS Johnn Hai, MD   2 mg at 02/02/19 2102  . temazepam (RESTORIL) capsule 45 mg  45 mg Oral QHS Johnn Hai, MD      . valACYclovir Estell Harpin) tablet 500 mg  500 mg Oral Daily Johnn Hai, MD   500 mg at 02/03/19 0804  . vortioxetine HBr (TRINTELLIX) tablet 10 mg  10 mg Oral Daily Johnn Hai, MD   10 mg at 02/03/19 0804   PTA Medications: Medications Prior to Admission   Medication Sig Dispense Refill Last Dose  . estradiol (ESTRACE) 2 MG tablet Take 2 mg by mouth 2 (two) times daily.    02/01/2019 at Unknown time  . fesoterodine (TOVIAZ) 4 MG TB24 tablet Take 4 mg by mouth daily.   02/01/2019 at Unknown time  . FLUoxetine (PROZAC) 20 MG tablet Take 20 mg by mouth daily.   02/01/2019 at Unknown time  . loratadine (CLARITIN) 10 MG tablet Take 10 mg by mouth daily.   02/01/2019 at Unknown time  . metoprolol succinate (TOPROL-XL) 50 MG 24 hr tablet Take 50 mg by mouth daily. Take with or immediately following a meal.   02/01/2019 at Unknown time  . montelukast (SINGULAIR) 10 MG tablet Take 10 mg by mouth at bedtime.   02/01/2019 at Unknown time  . naproxen (NAPROSYN) 500 MG tablet Take 500 mg by mouth daily.   02/01/2019 at Unknown time  . valACYclovir (VALTREX) 500 MG tablet Take 500 mg by mouth daily.   unk    Patient Stressors: Financial difficulties Health problems Marital or family conflict Occupational concerns Substance abuse Traumatic event  Patient Strengths: Ability for insight Active sense of humor Average or above average intelligence Capable of independent living Communication skills General  fund of knowledge Motivation for treatment/growth Supportive family/friends  Treatment Modalities: Medication Management, Group therapy, Case management,  1 to 1 session with clinician, Psychoeducation, Recreational therapy.   Physician Treatment Plan for Primary Diagnosis: <principal problem not specified> Long Term Goal(s): Improvement in symptoms so as ready for discharge Improvement in symptoms so as ready for discharge   Short Term Goals: Ability to identify and develop effective coping behaviors will improve Ability to maintain clinical measurements within normal limits will improve  Medication Management: Evaluate patient's response, side effects, and tolerance of medication regimen.  Therapeutic Interventions: 1 to 1 sessions, Unit Group  sessions and Medication administration.  Evaluation of Outcomes: Not Met  Physician Treatment Plan for Secondary Diagnosis: Active Problems:   Depression   MDD (major depressive disorder), recurrent episode, severe ([facility])  Long Term Goal(s): Improvement in symptoms so as ready for discharge Improvement in symptoms so as ready for discharge   Short Term Goals: Ability to identify and develop effective coping behaviors will improve Ability to maintain clinical measurements within normal limits will improve     Medication Management: Evaluate patient's response, side effects, and tolerance of medication regimen.  Therapeutic Interventions: 1 to 1 sessions, Unit Group sessions and Medication administration.  Evaluation of Outcomes: Not Met   RN Treatment Plan for Primary Diagnosis: <principal problem not specified> Long Term Goal(s): Knowledge of disease and therapeutic regimen to maintain health will improve  Short Term Goals: Ability to identify and develop effective coping behaviors will improve and Compliance with prescribed medications will improve  Medication Management: RN will administer medications as ordered by provider, will assess and evaluate patient's response and provide education to patient for prescribed medication. RN will report any adverse and/or side effects to prescribing provider.  Therapeutic Interventions: 1 on 1 counseling sessions, Psychoeducation, Medication administration, Evaluate responses to treatment, Monitor vital signs and CBGs as ordered, Perform/monitor CIWA, COWS, AIMS and Fall Risk screenings as ordered, Perform wound care treatments as ordered.  Evaluation of Outcomes: Not Met   LCSW Treatment Plan for Primary Diagnosis: <principal problem not specified> Long Term Goal(s): Safe transition to appropriate next level of care at discharge, Engage patient in therapeutic group addressing interpersonal concerns.  Short Term Goals: Engage patient in  aftercare planning with referrals and resources, Increase social support and Increase skills for wellness and recovery  Therapeutic Interventions: Assess for all discharge needs, 1 to 1 time with Social worker, Explore available resources and support systems, Assess for adequacy in community support network, Educate family and significant other(s) on suicide prevention, Complete Psychosocial Assessment, Interpersonal group therapy.  Evaluation of Outcomes: Not Met   Progress in Treatment: Attending groups: No. Participating in groups: No. Taking medication as prescribed: Yes. Toleration medication: Yes. Family/Significant other contact made: No, will contact:  when given permission Patient understands diagnosis: No. Discussing patient identified problems/goals with staff: Yes. Medical problems stabilized or resolved: Yes. Denies suicidal/homicidal ideation: Yes. Issues/concerns per patient self-inventory: No. Other: none  New problem(s) identified: No, Describe:  none  New Short Term/Long Term Goal(s):  Patient Goals:  "find something positive"  Discharge Plan or Barriers:   Reason for Continuation of Hospitalization: Delusions  Hallucinations Medication stabilization  Estimated Length of Stay: 3-5 days.  Attendees: Patient: Tricia Brown 02/03/2019   Physician: Dr. Jake Samples, MD 02/03/2019   Nursing: Neldon Newport, RN 02/03/2019   RN Care Manager: 02/03/2019   Social Worker: Lurline Idol, LCSW 02/03/2019   Recreational Therapist:  02/03/2019   Other:  02/03/2019  Other:  02/03/2019   Other: 02/03/2019        Scribe for Treatment Team: Joanne Chars, [place] 02/03/2019 11:49 AM

## 2019-02-03 NOTE — Progress Notes (Signed)
Patient states that her positive event for the day was going outside and playing basketball with her peers. Her goal for tomorrow is to have another positive day similar to today.

## 2019-02-03 NOTE — Progress Notes (Signed)
Recreation Therapy Notes  Date: 5.15.20 Time: 1000 Location: 500 Hall Dayroom  Group Topic: Communication, Team Building, Problem Solving  Goal Area(s) Addresses:  Patient will effectively work with peer towards shared goal.  Patient will identify skill used to make activity successful.  Patient will identify how skills used during activity can be used to reach post d/c goals.   Behavioral Response:  Engaged  Intervention: STEM Activity   Activity:  Straw Bridge.  Patients placed in groups of 2.  Each group was given 20 straws and a long piece of masking tape.  Each group was to build a freestanding bridge that could hold a small puzzle box.  Education: Pharmacist, community, Building control surveyor.   Education Outcome: Acknowledges education  Clinical Observations/Feedback:  Pt worked well with her peer.  Pt expressed using teamwork with support team helps to come up with a plan to help deal with a crisis.  Pt also expressed having a good foundation to work from.    Caroll Rancher, LRT/CTRS      Caroll Rancher A 02/03/2019 12:10 PM

## 2019-02-04 NOTE — Progress Notes (Addendum)
D: Patient observed in dayroom earlier this evening interacting with female peer. On approach patient brighter and less anxious than last evening. Patient states, "I'm really doing better. I journaled a lot today and it helped me organize my thoughts and process things." She denies any instances of hearing her mom's voice as she had been. Patient's affect animated, mood anxious. Patient's speech somewhat rapid and pressured.  Patient denies pain. COVID-19 screen negative, afebrile. Respiratory assessment WDL. 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   A: Medicated per orders, prn immodium given with good effect. Medication education provided. Level III obs in place for safety. Emotional support offered. Patient encouraged to complete Suicide Safety Plan before discharge. Encouraged to attend and participate in unit programming.  Fall prevention plan in place and reviewed with patient as pt is a high fall risk.   R: Patient verbalizes understanding of POC, falls prevention education.  Patient denies SI/HI/AVH and remains safe on level III obs. Will continue to monitor throughout the night.

## 2019-02-04 NOTE — Progress Notes (Signed)
D Pt is observed OOB UAL on the 400 hall after she is transferred from 500 hall to 400 hall today ( she is happy about this transition).  She is observed OOB UAL on the 400 hall, she interacts appropriately with other 400 hall patients and staff, seen laughing and joking and socializing with her peers. SHe wears her own street clothes and she is clean.     A She completed her daily assessment and on this she wrote shje denied SI today. She does not rate her feelings of anxiety, hopelessness and depression. She verbalizes " each time I come here, I can figure out how I am being unhelathy and get myself straightened out". She is focusing on learning healtheir coping skills.     R Support offered and safeyt maintianed.

## 2019-02-04 NOTE — [facility] Group Notes (Signed)
[facility] LCSW Group Therapy Note  02/04/2019  10:00-11:00AM  Type of Therapy and Topic:  Group Therapy - Accepting We Are All Damaged People  Participation Level:  Active   Description of Group:  Patients in this group were asked to share whether they feel that they are "damaged" and explain their responses.  A song entitled "Damaged People" was then played, followed by a discussion of the relevance/relatedness of this song to each patient.   The conclusion of the group was that our goal as humans does not need to be perfection, but rather growth.  Insights among group members were shared, including that it is easy to point the fingers at others as being damaged, but actually we need to realize that we also are flawed humans with problems to overcome.  The group concluded with an emphasis on how this is ultimately a message of hope that we face struggles like every other person in the world, and that we are not alone.  Therapeutic Goals: 1)  introduce the concept of pain and hardship being universal  2)  connect emotionally to a musical message and to other group members  3)  identify the patient's current beliefs about their own broken methods of resolving their life problems to date, specifically related to this hospitalization  4)  allow time and space for patients to vent their pain and receive support from other patients  5)  elicit hope that arises from realizing we are not alone in our human struggles   Summary of Patient Progress:  The patient expressed that she does feel she is "damaged," because her mother died 2 months ago, and she had been her caregiver so is really missing her mother and her own role as caregiver.  She stated that both her mother and grandmother had depression, with grandmother going through ECT at one point, but she herself is identified as the black sheep of the family.  Her 18yo son has recently moved out too, so she is in the home she grew up in by herself, and is very  lonely..  She said that everything in her life was good until her parents divorced when she was 8yo, and that is when she started to get labeled as a problem.  .  Therapeutic Modalities:   Motivational Interviewing Activity  Lynnell Chad  02/04/2019 8:46 AM

## 2019-02-04 NOTE — Progress Notes (Signed)
Patient observed up and on the hallway phone. She became very anxious once call was completed. She reports that she has too much stuff in her head and she is trying to fix everything. Writer encouraged her to focus on self so that she could get better. She was informed of hs medications and compliant. Support given and safety maintained on unit with 15 min checks.

## 2019-02-04 NOTE — Progress Notes (Signed)
Adult Psychoeducational Group Note  Date:  02/04/2019 Time:  8:58 PM  Group Topic/Focus:  Wrap-Up Group:   The focus of this group is to help patients review their daily goal of treatment and discuss progress on daily workbooks.  Participation Level:  Minimal  Participation Quality:  Appropriate  Affect:  Appropriate  Cognitive:  Appropriate  Insight: Appropriate  Engagement in Group:  Engaged  Modes of Intervention:  Discussion  Additional Comments:  Pt rated day 1/10. Pt stated that some coping skills that she has learned include: being honest with herself and journaling. Pt also stated that she wants to learn about her mental health issues and how to live a meaningful life with them.   Kaleen Odea R 02/04/2019, 8:58 PM

## 2019-02-04 NOTE — [facility] Group Notes (Signed)
[facility] Group Notes:  (Nursing)  Date:  02/04/2019  Time: 130 PM Type of Therapy:  Nurse Education  Participation Level:  Active  Participation Quality:  Appropriate  Affect:  Appropriate  Cognitive:  Appropriate  Insight:  Appropriate  Engagement in Group:  Engaged  Modes of Intervention:  Education  Summary of Progress/Problems: Nurse led Life Skills Group Shela Nevin 02/04/2019, 2:07 PM
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
## 2019-02-05 LAB — BASIC METABOLIC PANEL WITH GFR
Anion gap: 9 (ref 5–15)
BUN: 11 mg/dL (ref 6–20)
CO2: 27 mmol/L (ref 22–32)
Calcium: 9.9 mg/dL (ref 8.9–10.3)
Chloride: 102 mmol/L (ref 98–111)
Creatinine, Ser: 0.8 mg/dL (ref 0.44–1.00)
GFR calc Af Amer: >60 mL/min
GFR calc non Af Amer: >60 mL/min
Glucose, Bld: 90 mg/dL (ref 70–99)
Potassium: 4.5 mmol/L (ref 3.5–5.1)
Sodium: 138 mmol/L (ref 135–145)

## 2019-02-05 LAB — LIPID PANEL
Cholesterol: 194 mg/dL (ref 0–200)
HDL: 69 mg/dL
LDL Cholesterol: 89 mg/dL (ref 0–99)
Total CHOL/HDL Ratio: 2.8 ratio
Triglycerides: 178 mg/dL — ABNORMAL HIGH
VLDL: 36 mg/dL (ref 0–40)

## 2019-02-05 LAB — TSH: TSH: 1.101 u[IU]/mL (ref 0.350–4.500)

## 2019-02-05 LAB — HEMOGLOBIN A1C
Hgb A1c MFr Bld: 4.5 % — ABNORMAL LOW (ref 4.8–5.6)
Mean Plasma Glucose: 82.45 mg/dL

## 2019-02-05 MED ORDER — DULOXETINE HCL 30 MG PO CPEP
30.0000 mg | ORAL_CAPSULE | Freq: Every day | ORAL | Status: DC
  Administered 2019-02-06: 08:00:00 30 mg via ORAL
  Filled 2019-02-05 (×3): qty 1

## 2019-02-05 MED ORDER — TEMAZEPAM 30 MG PO CAPS
30.0000 mg | ORAL_CAPSULE | Freq: Every day | ORAL | Status: DC
  Administered 2019-02-05 – 2019-02-06 (×2): 30 mg via ORAL
  Filled 2019-02-05 (×2): qty 1

## 2019-02-05 MED ORDER — PRAZOSIN HCL 1 MG PO CAPS
1.0000 mg | ORAL_CAPSULE | Freq: Every day | ORAL | Status: DC
  Administered 2019-02-05 – 2019-02-09 (×5): 1 mg via ORAL
  Filled 2019-02-05: qty 14
  Filled 2019-02-05 (×4): qty 1
  Filled 2019-02-05: qty 14
  Filled 2019-02-05 (×2): qty 1

## 2019-02-05 NOTE — Progress Notes (Signed)
Patient ID: Tricia Brown, female   DOB: 08/06/1976, 43 y.o.   MRN: [id number]   [place] NOVEL CORONAVIRUS (COVID-19) DAILY CHECK-OFF SYMPTOMS - answer yes or no to each - every day NO YES  Have you had a fever in the past 24 hours?  . Fever (Temp > 37.80C / 100F) X   Have you had any of these symptoms in the past 24 hours? . New Cough .  Sore Throat  .  Shortness of Breath .  Difficulty Breathing .  Unexplained Body Aches   X   Have you had any one of these symptoms in the past 24 hours not related to allergies?   . Runny Nose .  Nasal Congestion .  Sneezing   X   If you have had runny nose, nasal congestion, sneezing in the past 24 hours, has it worsened?  X   EXPOSURES - check yes or no X   Have you traveled outside the state in the past 14 days?  X   Have you been in contact with someone with a confirmed diagnosis of COVID-19 or PUI in the past 14 days without wearing appropriate PPE?  X   Have you been living in the same home as a person with confirmed diagnosis of COVID-19 or a PUI (household contact)?    X   Have you been diagnosed with COVID-19?    X              What to do next: Answered NO to all: Answered YES to anything:   Proceed with unit schedule Follow the [facility] Inpatient Flowsheet.

## 2019-02-05 NOTE — [facility] Group Notes (Signed)
[facility] LCSW Group Therapy Note  02/05/2019  10:00-11:00AM  Type of Therapy and Topic:  Group Therapy:  Adding Supports Including Yourself  Participation Level:  Active   Description of Group:  Patients in this group were introduced to the concept that additional supports including self-support are an essential part of recovery.  Patients listed what supports they believe they need to add to their lives to achieve their goals at discharge, and they listed such things as therapist, family, doctor, support groups, and service dog.  CSW described the  continuum of mental health/substance abuse services available and the group discussed the differences among these including support group, therapy group, 12-step group, Doctor, hospital, Secondary school teacher, and such.  A song entitled "My Own Hero" was played and a group discussion ensued in which patients stated they could relate to the song and it inspired them to realize they have be willing to help themselves in order to succeed, because other people cannot achieve sobriety or stability for them.  A song was played called "I Am Enough" which led to a discussion about being willing to believe we are worth the effort of being a self-support.  Group members expressed appreciation for each other.  Therapeutic Goals: 1)  demonstrate the importance of being a key part of one's own support system 2)  discuss various available supports 3)  encourage patient to use music as part of their self-support and focus on goals 4)  elicit ideas from patients about supports that need to be added   Summary of Patient Progress:  The patient expressed a support that she would like to add is counseling and substance abuse treatment outside of the hospital.   Therapeutic Modalities:   Motivational Interviewing Activity  Lynnell Chad
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
## 2019-02-06 MED ORDER — DULOXETINE HCL 20 MG PO CPEP
40.0000 mg | ORAL_CAPSULE | Freq: Every day | ORAL | Status: DC
  Administered 2019-02-07: 40 mg via ORAL
  Filled 2019-02-06 (×2): qty 2

## 2019-02-06 NOTE — Plan of Care (Signed)
D: Patient is alert, oriented, pleasant, and cooperative. Denies SI, HI, AVH, and verbally contracts for safety. Patient reports feeling anxious. Patient denies physical symptoms/pain. Patient reports she feels good about learning some coping skills and being honest with herself.   A: Medications administered per MD order. Support provided. Patient educated on safety on the unit and medications. Routine safety checks every 15 minutes. Patient stated understanding to tell nurse about any new physical symptoms. Patient understands to tell staff of any needs.     R: No adverse drug reactions noted. Patient verbally contracts for safety. Patient remains safe at this time and will continue to monitor.   Problem: Education: Goal: Emotional status will improve Outcome: Progressing   Problem: Safety: Goal: Periods of time without injury will increase Outcome: Progressing   Patient reports she feels good about learning some coping skills and being honest with herself. Patient remains safe and will continue to monitor.   [place] NOVEL CORONAVIRUS (COVID-19) DAILY CHECK-OFF SYMPTOMS - answer yes or no to each - every day NO YES  Have you had a fever in the past 24 hours?  Fever (Temp > 37.80C / 100F) X   Have you had any of these symptoms in the past 24 hours? New Cough  Sore Throat   Shortness of Breath  Difficulty Breathing  Unexplained Body Aches   X   Have you had any one of these symptoms in the past 24 hours not related to allergies?   Runny Nose  Nasal Congestion  Sneezing   X   If you have had runny nose, nasal congestion, sneezing in the past 24 hours, has it worsened?  X   EXPOSURES - check yes or no X   Have you traveled outside the state in the past 14 days?  X   Have you been in contact with someone with a confirmed diagnosis of COVID-19 or PUI in the past 14 days without wearing appropriate PPE?  X   Have you been living in the same home as a person with confirmed  diagnosis of COVID-19 or a PUI (household contact)?    X   Have you been diagnosed with COVID-19?    X              What to do next: Answered NO to all: Answered YES to anything:   Proceed with unit schedule Follow the [facility] Inpatient Flowsheet.

## 2019-02-06 NOTE — Progress Notes (Signed)
Recreation Therapy Notes  Date:  1.22.20 Time: 0930 Location: [address] Dayroom  Group Topic: Stress Management  Goal Area(s) Addresses:  Patient will identify positive stress management techniques. Patient will identify benefits of using stress management post d/c.  Intervention: Stress Management  Activity :  Meditation.  LRT introduced the stress management technique of meditation.  LRT played Brown meditation that focused on making the most of your day.  Patients were to listen and follow as meditation played to engage in activity.  Education:  Stress Management, Discharge Planning.   Education Outcome: Acknowledges Education  Clinical Observations/Feedback: Pt did not attend group.     Tricia Brown, LRT/CTRS         Tricia Brown 02/06/2019 11:54 AM 

## 2019-02-06 NOTE — Progress Notes (Signed)
D:  Patient's self inventory sheet, patient has fair sleep, sleep medication helpful.  Good appetite, high energy level, good concentration.  Rated depression 3, denied hopeless, #5 anxiety.  Denied withdrawals.  Denied SI.  Denied physical problems.  Physical pain, worst pain in past 24 hours is #3, lower back, takes tylenol which is helpful.  Goal is meet with care team and have honest conversation about herself.  Plans to continue with workbook, attend groups, honest with herself.  "Thank You."  Does not feel ready for discharge. A:  Medications administered per MD orders.   Emotional support and encouragement given patient. R:  Denied SI and HI, contracts for safety.  Denied A/V hallucinations.

## 2019-02-06 NOTE — [facility] Group Notes (Signed)
LCSW Group Therapy Note 02/06/2019 3:06 PM  Type of Therapy and Topic: Group Therapy: Overcoming Obstacles  Participation Level: Active  Description of Group:  In this group patients will be encouraged to explore what they see as obstacles to their own wellness and recovery. They will be guided to discuss their thoughts, feelings, and behaviors related to these obstacles. The group will process together ways to cope with barriers, with attention given to specific choices patients can make. Each patient will be challenged to identify changes they are motivated to make in order to overcome their obstacles. This group will be process-oriented, with patients participating in exploration of their own experiences as well as giving and receiving support and challenge from other group members.  Therapeutic Goals: 1. Patient will identify personal and current obstacles as they relate to admission. 2. Patient will identify barriers that currently interfere with their wellness or overcoming obstacles.  3. Patient will identify feelings, thought process and behaviors related to these barriers. 4. Patient will identify two changes they are willing to make to overcome these obstacles:   Summary of Patient Progress  Dionte was engaged and participated throughout the group session. Shanetra reports that her main obstacle was "taking care of everybody, but myself". Coraleigh stated that she plans on focusing on herself while here in the hospital.    Therapeutic Modalities:  Cognitive Behavioral Therapy Solution Focused Therapy Motivational Interviewing Relapse Prevention Therapy   Alcario Drought Clinical Social Worker

## 2019-02-06 NOTE — Progress Notes (Signed)
Adult Psychoeducational Group Note  Date:  02/06/2019 Time:  10:44 AM  Group Topic/Focus:  Developing a Wellness Toolbox:   The focus of this group is to help patients develop a "wellness toolbox" with skills and strategies to promote recovery upon discharge.  Participation Level:  Active  Participation Quality:  Appropriate  Affect:  Appropriate  Cognitive:  Alert  Insight: Appropriate  Engagement in Group:  Engaged  Modes of Intervention:  Discussion and Education  Additional Comments:  Pt was able to attend group this morning and stated " I would love to get my life in order sometime soon."  Verenice Westrich E 02/06/2019, 10:44 AM

## 2019-02-06 NOTE — Plan of Care (Signed)
  Problem: Education: Goal: Mental status will improve Outcome: Progressing   Problem: Activity: Goal: Interest or engagement in activities will improve Outcome: Progressing  D: Pt alert and oriented on the unit. Pt engaging with RN staff and other pts. Pt denies SI/HI, A/VH, and she also participated during unit groups and activities. Pt is pleasant and cooperative. A: Education, support and encouragement provided, q15 minute safety checks remain in effect. Medications administered per MD orders. R: No reactions/side effects to medicine noted. Pt denies any concerns at this time, and verbally contracts for safety. Pt ambulating on the unit with no issues. Pt remains safe on and off the unit.
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
## 2019-02-07 DIAGNOSIS — F1120 Opioid dependence, uncomplicated: Secondary | ICD-10-CM

## 2019-02-07 DIAGNOSIS — F1924 Other psychoactive substance dependence with psychoactive substance-induced mood disorder: Secondary | ICD-10-CM

## 2019-02-07 DIAGNOSIS — F1523 Other stimulant dependence with withdrawal: Secondary | ICD-10-CM

## 2019-02-07 MED ORDER — NAPROXEN 500 MG PO TABS
500.0000 mg | ORAL_TABLET | Freq: Two times a day (BID) | ORAL | Status: DC | PRN
  Administered 2019-02-07 – 2019-02-09 (×3): 500 mg via ORAL
  Filled 2019-02-07 (×3): qty 1

## 2019-02-07 MED ORDER — CLONIDINE HCL 0.1 MG PO TABS
0.1000 mg | ORAL_TABLET | ORAL | Status: DC
  Administered 2019-02-09: 0.1 mg via ORAL
  Filled 2019-02-07 (×3): qty 1

## 2019-02-07 MED ORDER — DULOXETINE HCL 60 MG PO CPEP
60.0000 mg | ORAL_CAPSULE | Freq: Every day | ORAL | Status: DC
  Administered 2019-02-08 – 2019-02-09 (×2): 60 mg via ORAL
  Filled 2019-02-07: qty 14
  Filled 2019-02-07 (×3): qty 1
  Filled 2019-02-07: qty 14

## 2019-02-07 MED ORDER — HYDROXYZINE HCL 25 MG PO TABS: 25.0000 mg | ORAL_TABLET | Freq: Four times a day (QID) | ORAL | Status: DC | PRN

## 2019-02-07 MED ORDER — LOPERAMIDE HCL 2 MG PO CAPS: 2.0000 mg | ORAL_CAPSULE | ORAL | Status: DC | PRN

## 2019-02-07 MED ORDER — ONDANSETRON 4 MG PO TBDP: 4.0000 mg | ORAL_TABLET | Freq: Four times a day (QID) | ORAL | Status: DC | PRN

## 2019-02-07 MED ORDER — CLONIDINE HCL 0.1 MG PO TABS: 0.1000 mg | ORAL_TABLET | Freq: Every day | ORAL | Status: DC

## 2019-02-07 MED ORDER — METHOCARBAMOL 500 MG PO TABS
500.0000 mg | ORAL_TABLET | Freq: Three times a day (TID) | ORAL | Status: DC | PRN
  Administered 2019-02-07 – 2019-02-09 (×4): 500 mg via ORAL
  Filled 2019-02-07 (×5): qty 1

## 2019-02-07 MED ORDER — DICYCLOMINE HCL 20 MG PO TABS
20.0000 mg | ORAL_TABLET | Freq: Four times a day (QID) | ORAL | Status: DC | PRN
  Administered 2019-02-07 – 2019-02-09 (×2): 20 mg via ORAL
  Filled 2019-02-07 (×2): qty 1

## 2019-02-07 MED ORDER — CLONIDINE HCL 0.1 MG PO TABS
0.1000 mg | ORAL_TABLET | Freq: Four times a day (QID) | ORAL | Status: AC
  Administered 2019-02-07 – 2019-02-09 (×7): 0.1 mg via ORAL
  Filled 2019-02-07 (×7): qty 1

## 2019-02-07 NOTE — Progress Notes (Signed)
Chaplain follow up with Carollee Herter due to pt request as she left spiritual care group on grief and loss.   Provided support with Carollee Herter on [address].   Nora spoke with chaplian about her death of mother.   Illianna stated she has never sought support for psychiatric or emotional needs.  Is tearful during encounter and speaks about grief, nuclear family system including history of caring for mother following  mother's divorce from father, history of drug use.   States she is hopeful to begin to find support.  Speaks with chaplain about faith - stating she believes in a Chief of Staff.  Conversation centers around connecting work on emotional needs and honoring herself with connection with Field seismologist.   Expressed feeling of guilt around her son - worrying that her not having cared for herself has influenced his emotional state. Spoke with chaplain about value of seeking support currently.  Stated

## 2019-02-07 NOTE — Progress Notes (Signed)
Spiritual care group on loss and grief facilitated by Wilkie Aye, MDiv.   Group goal: Support / education around grief.  Identifying grief patterns, feelings / responses to grief, identifying behaviors that may emerge from grief responses, identifying when one may call on an ally or coping skill.  Group Description:  Following introductions and group rules, group opened with psycho-social ed. Group members engaged in facilitated dialog around topic of loss, with particular support around experiences of loss in their lives. Group Identified types of loss (relationships / self / things) and identified patterns, circumstances, and changes that precipitate losses. Reflected on thoughts / feelings around loss, normalized grief responses, and recognized variety in grief experience.   Group engaged in visual explorer activity, identifying elements of grief journey as well as needs / ways of caring for themselves.  Group reflected on Worden's tasks of grief.  Group facilitation drew on brief cognitive behavioral, narrative, and Adlerian modalities   Patient progress: Tricia Brown was present at beginning of group.  Tearful during group introductions.  She asked facilitator if she could step out of group.

## 2019-02-07 NOTE — Progress Notes (Signed)
Did not attend group 

## 2019-02-07 NOTE — Progress Notes (Signed)
D:  Patient's self inventory sheet, patient has poor sleep, sleep medication not helpful.  Fair appetite, poor concentration.  Rated depression, anxiety, hopeless #10.  Withdrawals, cramping, agitation, irritability.  SI on self inventory sheet.  Physical problems, pain, lower back and severe stomach cramps.  Worst pain #5, pain medicine not helpful.  Goal is being hones with herself.  Plans to be honest with MD.  No discharge plan. A:  Medications administered per MD orders.  Emotional support and encouragement given patient. R:  Patient denied while talking to nurse today, contracts for safety.  Denied HI.  Has visual hallucinations on/off.  Voices yesterday, but not today.

## 2019-02-07 NOTE — Plan of Care (Signed)
Nurse discussed anxiety, depression and coping skills with patient.  
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
## 2019-02-07 NOTE — [facility] Suicide Risk Assessment (Signed)
[facility] INPATIENT:  Family/Significant Other Suicide Prevention Education  Suicide Prevention Education:  Education Completed; Tricia Brown, father, [phone]  has been identified by the patient as the family member/significant other with whom the patient will be residing, and identified as the person(s) who will aid the patient in the event of a mental health crisis (suicidal ideations/suicide attempt).  With written consent from the patient, the family member/significant other has been provided the following suicide prevention education, prior to the and/or following the discharge of the patient.  The suicide prevention education provided includes the following:  Suicide risk factors  Suicide prevention and interventions  National Suicide Hotline telephone number  [facility] assessment telephone number  [facility] Emergency Assistance 911  [facility] and/or Residential Mobile Crisis Unit telephone number  Request made of family/significant other to:  Remove weapons (e.g., guns, rifles, knives), all items previously/currently identified as safety concern.    Remove drugs/medications (over-the-counter, prescriptions, illicit drugs), all items previously/currently identified as a safety concern.  The family member/significant other verbalizes understanding of the suicide prevention education information provided.  The family member/significant other agrees to remove the items of safety concern listed above.   Father reports patient still sounds a bit confused on the phone, but notes some improvement. He has no safety concerns for the patient harming herself or others. He attributes her chaotic living situation and the recent death of her mother as stressors, father reports patient still has a significant amount of responsibilities involving her mother's belongings and estate.  Father hopes patient can remain inpatient for a few more days for stabilization and thinks  she would benefit from outpatient follow up.  Darreld Mclean 02/07/2019, 9:44 AM

## 2019-02-07 NOTE — Progress Notes (Signed)
Patient has woken up and not been able to fall back asleep the past couple nights. She has had a good amount of medication to help her sleep at bedtime med pass and no PRN medication available to help; in addition to the time being too close to morning for anything too sedating.   Patient just came up the hall and reported SI to this RN with a plan to shoot herself in the head. She reports she feels safe in the hospital but is unsure if she would harm herself or not if she was not in the hospital.
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
## 2019-02-08 DIAGNOSIS — F1120 Opioid dependence, uncomplicated: Secondary | ICD-10-CM

## 2019-02-08 MED ORDER — TRAZODONE HCL 50 MG PO TABS
50.0000 mg | ORAL_TABLET | Freq: Every evening | ORAL | Status: DC | PRN
  Administered 2019-02-09 – 2019-02-10 (×3): 50 mg via ORAL
  Filled 2019-02-08 (×2): qty 1
  Filled 2019-02-08: qty 28
  Filled 2019-02-08: qty 1
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
## 2019-02-08 NOTE — Tx Team (Signed)
Interdisciplinary Treatment and Diagnostic Plan Update  02/08/2019 Time of Session: 0912 Tricia Brown MRN: [id number]  Principal Diagnosis: <principal problem not specified>  Secondary Diagnoses: Active Problems:   Depression   MDD (major depressive disorder), recurrent episode, severe (HCC)   Current Medications:  Current Facility-Administered Medications  Medication Dose Route Frequency Provider Last Rate Last Dose  . acetaminophen (TYLENOL) tablet 650 mg  650 mg Oral Q6H PRN Malvin Johns, MD   650 mg at 02/07/19 0604  . alum & mag hydroxide-simeth (MAALOX/MYLANTA) 200-200-20 MG/5ML suspension 30 mL  30 mL Oral Q4H PRN Malvin Johns, MD      . ARIPiprazole (ABILIFY) tablet 2 mg  2 mg Oral Daily Malvin Johns, MD   2 mg at 02/08/19 0800  . cloNIDine (CATAPRES) tablet 0.1 mg  0.1 mg Oral QID Antonieta Pert, MD   0.1 mg at 02/08/19 0800   Followed by  . [START ON 02/09/2019] cloNIDine (CATAPRES) tablet 0.1 mg  0.1 mg Oral BH-qamhs Clary, Marlane Mingle, MD       Followed by  . [START ON 02/11/2019] cloNIDine (CATAPRES) tablet 0.1 mg  0.1 mg Oral QAC breakfast Antonieta Pert, MD      . dicyclomine (BENTYL) tablet 20 mg  20 mg Oral Q6H PRN Antonieta Pert, MD   20 mg at 02/07/19 1446  . DULoxetine (CYMBALTA) DR capsule 60 mg  60 mg Oral Daily Antonieta Pert, MD   60 mg at 02/08/19 0800  . estradiol (ESTRACE) tablet 2 mg  2 mg Oral BID Malvin Johns, MD   2 mg at 02/08/19 0801  . hydrOXYzine (ATARAX/VISTARIL) tablet 25 mg  25 mg Oral TID PRN Donell Sievert E, PA-C   25 mg at 02/07/19 2113  . loperamide (IMODIUM) capsule 2-4 mg  2-4 mg Oral PRN Antonieta Pert, MD      . magnesium hydroxide (MILK OF MAGNESIA) suspension 30 mL  30 mL Oral Daily PRN Malvin Johns, MD      . methocarbamol (ROBAXIN) tablet 500 mg  500 mg Oral Q8H PRN Antonieta Pert, MD   500 mg at 02/08/19 0036  . naproxen (NAPROSYN) tablet 500 mg  500 mg Oral BID PRN Antonieta Pert, MD   500 mg at 02/08/19  0036  . nicotine (NICODERM CQ - dosed in mg/24 hours) patch 21 mg  21 mg Transdermal Daily Malvin Johns, MD   21 mg at 02/08/19 0802  . ondansetron (ZOFRAN-ODT) disintegrating tablet 4 mg  4 mg Oral Q6H PRN Antonieta Pert, MD      . prazosin (MINIPRESS) capsule 1 mg  1 mg Oral QHS Cobos, Rockey Situ, MD   1 mg at 02/07/19 2113  . valACYclovir (VALTREX) tablet 500 mg  500 mg Oral Daily Malvin Johns, MD   500 mg at 02/08/19 0801   PTA Medications: Medications Prior to Admission  Medication Sig Dispense Refill Last Dose  . estradiol (ESTRACE) 2 MG tablet Take 2 mg by mouth 2 (two) times daily.    02/01/2019 at Unknown time  . fesoterodine (TOVIAZ) 4 MG TB24 tablet Take 4 mg by mouth daily.   02/01/2019 at Unknown time  . FLUoxetine (PROZAC) 20 MG tablet Take 20 mg by mouth daily.   02/01/2019 at Unknown time  . loratadine (CLARITIN) 10 MG tablet Take 10 mg by mouth daily.   02/01/2019 at Unknown time  . metoprolol succinate (TOPROL-XL) 50 MG 24 hr tablet Take 50 mg by mouth daily.  Take with or immediately following a meal.   02/01/2019 at Unknown time  . montelukast (SINGULAIR) 10 MG tablet Take 10 mg by mouth at bedtime.   02/01/2019 at Unknown time  . naproxen (NAPROSYN) 500 MG tablet Take 500 mg by mouth daily.   02/01/2019 at Unknown time  . valACYclovir (VALTREX) 500 MG tablet Take 500 mg by mouth daily.   unk    Patient Stressors: Financial difficulties Health problems Marital or family conflict Occupational concerns Substance abuse Traumatic event  Patient Strengths: Ability for insight Active sense of humor Average or above average intelligence Capable of independent living Communication skills General fund of knowledge Motivation for treatment/growth Supportive family/friends  Treatment Modalities: Medication Management, Group therapy, Case management,  1 to 1 session with clinician, Psychoeducation, Recreational therapy.   Physician Treatment Plan for Primary Diagnosis:  <principal problem not specified> Long Term Goal(s): Improvement in symptoms so as ready for discharge Improvement in symptoms so as ready for discharge   Short Term Goals: Ability to identify and develop effective coping behaviors will improve Ability to maintain clinical measurements within normal limits will improve  Medication Management: Evaluate patient's response, side effects, and tolerance of medication regimen.  Therapeutic Interventions: 1 to 1 sessions, Unit Group sessions and Medication administration.  Evaluation of Outcomes: Progressing  Physician Treatment Plan for Secondary Diagnosis: Active Problems:   Depression   MDD (major depressive disorder), recurrent episode, severe (HCC)  Long Term Goal(s): Improvement in symptoms so as ready for discharge Improvement in symptoms so as ready for discharge   Short Term Goals: Ability to identify and develop effective coping behaviors will improve Ability to maintain clinical measurements within normal limits will improve     Medication Management: Evaluate patient's response, side effects, and tolerance of medication regimen.  Therapeutic Interventions: 1 to 1 sessions, Unit Group sessions and Medication administration.  Evaluation of Outcomes: Progressing   RN Treatment Plan for Primary Diagnosis: <principal problem not specified> Long Term Goal(s): Knowledge of disease and therapeutic regimen to maintain health will improve  Short Term Goals: Ability to identify and develop effective coping behaviors will improve and Compliance with prescribed medications will improve  Medication Management: RN will administer medications as ordered by provider, will assess and evaluate patient's response and provide education to patient for prescribed medication. RN will report any adverse and/or side effects to prescribing provider.  Therapeutic Interventions: 1 on 1 counseling sessions, Psychoeducation, Medication administration,  Evaluate responses to treatment, Monitor vital signs and CBGs as ordered, Perform/monitor CIWA, COWS, AIMS and Fall Risk screenings as ordered, Perform wound care treatments as ordered.  Evaluation of Outcomes: Progressing   LCSW Treatment Plan for Primary Diagnosis: <principal problem not specified> Long Term Goal(s): Safe transition to appropriate next level of care at discharge, Engage patient in therapeutic group addressing interpersonal concerns.  Short Term Goals: Engage patient in aftercare planning with referrals and resources, Increase social support and Increase skills for wellness and recovery  Therapeutic Interventions: Assess for all discharge needs, 1 to 1 time with Social worker, Explore available resources and support systems, Assess for adequacy in community support network, Educate family and significant other(s) on suicide prevention, Complete Psychosocial Assessment, Interpersonal group therapy.  Evaluation of Outcomes: Progressing   Progress in Treatment: Attending groups: Yes. Participating in groups: Yes. Taking medication as prescribed: Yes. Toleration medication: Yes. Family/Significant other contact made: Yes, individual(s) contacted:  the patient's father  Patient understands diagnosis: Yes. Limited insight  Discussing patient identified problems/goals with staff:  Yes. Medical problems stabilized or resolved: Yes. Denies suicidal/homicidal ideation: Yes. Issues/concerns per patient self-inventory: No. Other: none  New problem(s) identified: No, Describe:  none  New Short Term/Long Term Goal(s):medication stabilization, elimination of SI thoughts, development of comprehensive mental wellness plan.    Patient Goals:  "find something positive"  Discharge Plan or Barriers: Patient plans to discharge to [facility] substance   Reason for Continuation of Hospitalization: Delusions  Hallucinations Medication stabilization  Estimated  Length of Stay: 3-5 days.  Attendees: Patient: Tricia Brown 02/03/2019   Physician: Dr. Jeannine Kitten, MD 02/03/2019   Nursing: Norma Fredrickson, RN; Drenda Freeze, RN 02/03/2019   RN Care Manager: 02/03/2019   Social Worker: Daleen Squibb, LCSW; Baldo Daub ,LCSWA 02/03/2019   Recreational Therapist:  02/03/2019   Other:  02/03/2019   Other:  02/03/2019   Other: 02/03/2019        Scribe for Treatment Team: Maeola Sarah, LCSWA 02/08/2019 10:12 AM

## 2019-02-08 NOTE — Progress Notes (Signed)
Psychoeducational Group Note  Date:  02/08/2019 Time:  2253  Group Topic/Focus:  Wrap-Up Group:   The focus of this group is to help patients review their daily goal of treatment and discuss progress on daily workbooks.  Participation Level: Did Not Attend  Participation Quality:  Not Applicable  Affect:  Not Applicable  Cognitive:  Not Applicable  Insight:  Not Applicable  Engagement in Group: Not Applicable  Additional Comments:  The patient did not attend group this evening.   Hazle Coca S 02/08/2019, 10:53 PM

## 2019-02-08 NOTE — Progress Notes (Signed)
The focus of this group is to help patients establish daily goals to achieve during treatment and discuss how the patient can incorporate goal setting into their daily lives to aide in recovery.  Pt stated that her goal is to stay positive today. She said that she has been here a long time and she is ready to go home.

## 2019-02-08 NOTE — Progress Notes (Signed)
[place] NOVEL CORONAVIRUS (COVID-19) DAILY CHECK-OFF SYMPTOMS - answer yes or no to each - every day NO YES  Have you had a fever in the past 24 hours?  . Fever (Temp > 37.80C / 100F) X   Have you had any of these symptoms in the past 24 hours? . New Cough .  Sore Throat  .  Shortness of Breath .  Difficulty Breathing .  Unexplained Body Aches   X   Have you had any one of these symptoms in the past 24 hours not related to allergies?   . Runny Nose .  Nasal Congestion .  Sneezing   X   If you have had runny nose, nasal congestion, sneezing in the past 24 hours, has it worsened?  X   EXPOSURES - check yes or no X   Have you traveled outside the state in the past 14 days?  X   Have you been in contact with someone with a confirmed diagnosis of COVID-19 or PUI in the past 14 days without wearing appropriate PPE?  X   Have you been living in the same home as a person with confirmed diagnosis of COVID-19 or a PUI (household contact)?    X   Have you been diagnosed with COVID-19?    X              What to do next: Answered NO to all: Answered YES to anything:   Proceed with unit schedule Follow the [facility] Inpatient Flowsheet.   

## 2019-02-08 NOTE — Progress Notes (Addendum)
Tricia Brown was observe in bed with eyes closed. Respirations even and unlabored. Per MHT, c/o of insomnia and wanted a PRN. Pt was told by MHT to come meet nurse at window. Writer went to check on Pt twice and Pt remains asleep. Will access when Pt wakes up. Will continue with POC.

## 2019-02-08 NOTE — [facility] Group Notes (Signed)
Occupational Therapy Group Note  Date:  02/08/2019 Time:  11:48 AM  Group Topic/Focus:  Self Esteem Action Plan:   The focus of this group is to help patients create a plan to continue to build self-esteem after discharge.  Participation Level:  Active  Participation Quality:  Attentive  Affect:  Flat  Cognitive:  Alert  Insight: Lacking  Engagement in Group:  Engaged  Modes of Intervention:  Activity, Discussion, Education and Socialization  Additional Comments:    S: "Well since everyone is so anxious about me, should we just get it out now?"  O: OT tx with focus on self esteem building this date. Education given on definition of self esteem, with both causes of low and high self esteem identified. Activity given for pt to identify a positive/aspiring trait for each letter of the alphabet. Pt to work with peers to help complete activity and build positive thinking.   A: Pt presents with flat affect and workbook, attentive and taking notes. Pt appears paranoid, without being provoked stated to group "well since everyone's so anxious about me, should we get it out now?" Assured pt this is not the case, and redirected. Pt then left with provider and did not return.  P: OT group will be x1 per week while pt inpatient.  Dalphine Handing, MSOT, OTR/L Behavioral Health OT/ Acute Relief OT PHP Office: [phone]  Dalphine Handing 02/08/2019, 11:48 AM

## 2019-02-08 NOTE — Progress Notes (Signed)
D: Montie is struggling with the recent loss of her mother. She says she fears her mom would be ashamed of her struggles with addiction and mental health issues. She worries about judgment from her extended family. She says she realizes that she can only control herself, and she feels that remaining sober is important to her. "I don't want to be a victim." She feels that she continues to struggle with self-blame. She was tearful. She admits passive SI without a plan and contracts for safety. She is interested in cognitive-behavioral therapy and worried about returning to her home, which she says will stir up too many painful memories.   A: Meds given as ordered, including PRN Vistaril (minimal relief), Robaxin (results pending), and Naproxen (results pending). Q15 safety checks maintained. Provided support regarding patient's commitment to change/encouragement offered/feelings validated.  R: Pt remains free from harm and continues with treatment. Will continue to monitor for needs/safety.

## 2019-02-09 MED ORDER — ARIPIPRAZOLE 2 MG PO TABS
2.0000 mg | ORAL_TABLET | Freq: Once | ORAL | Status: AC
  Administered 2019-02-09: 2 mg via ORAL
  Filled 2019-02-09: qty 1

## 2019-02-09 MED ORDER — ARIPIPRAZOLE 2 MG PO TABS: 2.0000 mg | ORAL_TABLET | Freq: Every day | ORAL | 0 refills | Status: DC

## 2019-02-09 MED ORDER — NICOTINE 14 MG/24HR TD PT24: 14.0000 mg | MEDICATED_PATCH | Freq: Every day | TRANSDERMAL | 0 refills | Status: DC

## 2019-02-09 MED ORDER — ARIPIPRAZOLE 5 MG PO TABS
5.0000 mg | ORAL_TABLET | Freq: Every day | ORAL | Status: DC
  Filled 2019-02-09: qty 1
  Filled 2019-02-09: qty 14

## 2019-02-09 MED ORDER — ESTRADIOL 2 MG PO TABS: 2.0000 mg | ORAL_TABLET | Freq: Two times a day (BID) | ORAL | 0 refills | Status: DC

## 2019-02-09 MED ORDER — DULOXETINE HCL 60 MG PO CPEP: 60.0000 mg | ORAL_CAPSULE | Freq: Every day | ORAL | 0 refills | Status: DC

## 2019-02-09 MED ORDER — NICOTINE 14 MG/24HR TD PT24
14.0000 mg | MEDICATED_PATCH | Freq: Every day | TRANSDERMAL | Status: DC
  Filled 2019-02-09 (×2): qty 14

## 2019-02-09 MED ORDER — VALACYCLOVIR HCL 500 MG PO TABS: 500.0000 mg | ORAL_TABLET | Freq: Every day | ORAL | 0 refills | Status: DC

## 2019-02-09 MED ORDER — ARIPIPRAZOLE 5 MG PO TABS: 5.0000 mg | ORAL_TABLET | Freq: Every day | ORAL | 0 refills | Status: DC

## 2019-02-09 MED ORDER — PRAZOSIN HCL 1 MG PO CAPS: 1.0000 mg | ORAL_CAPSULE | Freq: Every day | ORAL | 0 refills | Status: DC

## 2019-02-09 MED ORDER — HYDROXYZINE HCL 25 MG PO TABS: 25.0000 mg | ORAL_TABLET | Freq: Three times a day (TID) | ORAL | 0 refills | Status: DC | PRN

## 2019-02-09 MED ORDER — TRAZODONE HCL 50 MG PO TABS: 50.0000 mg | ORAL_TABLET | Freq: Every evening | ORAL | 0 refills | Status: DC | PRN
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
## 2019-02-09 NOTE — Progress Notes (Signed)
Patient rated her day as a 10 out of a possible 10. She attributed her positive day to having learned that she will be discharged tomorrow. Her goal for tomorrow is to work on moving forward.
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
## 2019-02-09 NOTE — [facility] Suicide Risk Assessment (Signed)
[facility] Discharge Suicide Risk Assessment   Principal Problem: MDD (major depressive disorder), recurrent episode, severe (HCC) Discharge Diagnoses: Principal Problem:   MDD (major depressive disorder), recurrent episode, severe (HCC) Active Problems:   Depression   Opioid use disorder, severe, dependence (HCC)   Total Time spent with patient: 15 minutes  Musculoskeletal: Strength & Muscle Tone: within normal limits Gait & Station: normal Patient leans: N/A  Psychiatric Specialty Exam: Review of Systems  All other systems reviewed and are negative.   Blood pressure 98/69, pulse (!) 103, temperature 97.6 F (36.4 C), temperature source Oral, resp. rate 20, height 5\' 7"  (1.702 m), weight 63.5 kg, SpO2 100 %.Body mass index is 21.93 kg/m.  General Appearance: Casual  Eye Contact::  Good  Speech:  Normal Rate409  Volume:  Normal  Mood:  Euthymic  Affect:  Congruent  Thought Process:  Coherent and Descriptions of Associations: Intact  Orientation:  Full (Time, Place, and Person)  Thought Content:  Logical  Suicidal Thoughts:  No  Homicidal Thoughts:  No  Memory:  Immediate;   Fair Recent;   Fair Remote;   Fair  Judgement:  Intact  Insight:  Fair  Psychomotor Activity:  Increased  Concentration:  Good  Recall:  Good  Fund of Knowledge:Fair  Language: Good  Akathisia:  Negative  Handed:  Right  AIMS (if indicated):     Assets:  Desire for Improvement Resilience  Sleep:  Number of Hours: 5.25  Cognition: WNL  ADL's:  Intact   Mental Status Per Nursing Assessment::   On Admission:  Suicidal ideation indicated by patient, Self-harm thoughts  Demographic Factors:  Divorced or widowed, Caucasian and Low socioeconomic status  Loss Factors: Financial problems/change in socioeconomic status  Historical Factors: Impulsivity  Risk Reduction Factors:   Positive coping skills or problem solving skills  Continued Clinical Symptoms:  Bipolar Disorder:   Depressive  phase Alcohol/Substance Abuse/Dependencies  Cognitive Features That Contribute To Risk:  None    Suicide Risk:  Minimal: No identifiable suicidal ideation.  Patients presenting with no risk factors but with morbid ruminations; may be classified as minimal risk based on the severity of the depressive symptoms  Follow-up Information    [facility] Follow up on 02/10/2019.   Specialty:  Professional Counselor Why:  Deanna Artis with the agency will contact you with an appointment 3 days prior your discharge at [facility].  Contact information: [facility] [address] [place] [address] [phone]        [facility] Follow up.   Why:  Please attend your screening for possible admission on Friday, 5/22 at 7:45a.  Please bring your photo ID, proof of insurance, 30-day supply of medication with 1 refill on label and 2 weeks of clothing.  Contact information: [address] [name] [address] [phone]           Plan Of Care/Follow-up recommendations:  Activity:  ad lib  Antonieta Pert, MD 02/09/2019, 2:52 PM

## 2019-02-09 NOTE — Plan of Care (Signed)
Progress note  D: pt found in bed; compliant with medication administration. Pt is complaining of lower back pain that she rates at a 5/10. Pt denies any physical symptoms. Pt states her sleep habits are improving. Pt is animated and anxious about leaving tomorrow. Pt denies si/hi/ah/vh and verbally agrees to approach staff if these become apparent or before harming herself/others while at [facility].  A: pt provided support and encouragement. Pt given medication per protocol and standing orders. Q27m safety checks implemented and continued.  R: pt safe on the unit. Will continue to monitor.   Pt progressing in the following metrics  Problem: Education: Goal: Knowledge of [place] [facility] Education information/materials will improve Outcome: Progressing Goal: Verbalization of understanding the information provided will improve Outcome: Progressing   Problem: Activity: Goal: Sleeping patterns will improve Outcome: Progressing   Problem: Coping: Goal: Ability to verbalize frustrations and anger appropriately will improve Outcome: Progressing

## 2019-02-10 NOTE — Progress Notes (Signed)
Discharge note: Patient discharged to lobby with taxi voucher and plan [facility]. Patient is ambulatory and A&O x 4. Patient expresses readiness for discharge and feels safe to do so. Appropriate paperwork reviewed and given to patient. Patient expresses she understands paperwork. Prescriptions given to patient. Belongings from locker returned to patient.

## 2019-02-10 NOTE — Progress Notes (Signed)
D: Patient is alert, oriented, pleasant, and cooperative. Denies SI, HI, AVH, and verbally contracts for safety.    A: Medications administered per MD order. Support provided. Patient educated on safety on the unit and medications. Routine safety checks every 15 minutes. Patient stated understanding to tell nurse about any new physical symptoms. Patient understands to tell staff of any needs.     R: No adverse drug reactions noted. Patient verbally contracts for safety. Patient remains safe at this time and will continue to monitor.    [place] NOVEL CORONAVIRUS (COVID-19) DAILY CHECK-OFF SYMPTOMS - answer yes or no to each - every day NO YES  Have you had a fever in the past 24 hours?  . Fever (Temp > 37.80C / 100F) X   Have you had any of these symptoms in the past 24 hours? . New Cough .  Sore Throat  .  Shortness of Breath .  Difficulty Breathing .  Unexplained Body Aches   X   Have you had any one of these symptoms in the past 24 hours not related to allergies?   . Runny Nose .  Nasal Congestion .  Sneezing   X   If you have had runny nose, nasal congestion, sneezing in the past 24 hours, has it worsened?  X   EXPOSURES - check yes or no X   Have you traveled outside the state in the past 14 days?  X   Have you been in contact with someone with a confirmed diagnosis of COVID-19 or PUI in the past 14 days without wearing appropriate PPE?  X   Have you been living in the same home as a person with confirmed diagnosis of COVID-19 or a PUI (household contact)?    X   Have you been diagnosed with COVID-19?    X              What to do next: Answered NO to all: Answered YES to anything:   Proceed with unit schedule Follow the [facility] Inpatient Flowsheet.

## 2019-05-12 ENCOUNTER — Emergency Department (HOSPITAL_COMMUNITY): Payer: Medicaid Other

## 2019-05-12 ENCOUNTER — Encounter (HOSPITAL_COMMUNITY): Payer: Self-pay | Admitting: Emergency Medicine

## 2019-05-12 ENCOUNTER — Other Ambulatory Visit: Payer: Self-pay

## 2019-05-12 ENCOUNTER — Emergency Department (HOSPITAL_COMMUNITY)
Admission: EM | Admit: 2019-05-12 | Discharge: 2019-05-12 | Disposition: A | Discharge status: Home / Self Care | Payer: Medicaid Other | Attending: Emergency Medicine | Admitting: Emergency Medicine

## 2019-05-12 DIAGNOSIS — Z79899 Other long term (current) drug therapy: Secondary | ICD-10-CM | POA: Diagnosis not present

## 2019-05-12 DIAGNOSIS — F17210 Nicotine dependence, cigarettes, uncomplicated: Secondary | ICD-10-CM | POA: Diagnosis not present

## 2019-05-12 DIAGNOSIS — R109 Unspecified abdominal pain: Secondary | ICD-10-CM

## 2019-05-12 DIAGNOSIS — R1012 Left upper quadrant pain: Secondary | ICD-10-CM | POA: Insufficient documentation

## 2019-05-12 LAB — URINALYSIS, ROUTINE W REFLEX MICROSCOPIC
Bilirubin Urine: NEGATIVE
Glucose, UA: NEGATIVE mg/dL
Hgb urine dipstick: NEGATIVE
Ketones, ur: NEGATIVE mg/dL
Leukocytes,Ua: NEGATIVE
Nitrite: NEGATIVE
Protein, ur: NEGATIVE mg/dL
Specific Gravity, Urine: 1.003 — ABNORMAL LOW (ref 1.005–1.030)
pH: 7 (ref 5.0–8.0)

## 2019-05-12 LAB — CBC WITH DIFFERENTIAL/PLATELET
Abs Immature Granulocytes: 0.04 10*3/uL (ref 0.00–0.07)
Basophils Absolute: 0.1 10*3/uL (ref 0.0–0.1)
Basophils Relative: 1 %
Eosinophils Absolute: 0.1 10*3/uL (ref 0.0–0.5)
Eosinophils Relative: 1 %
HCT: 36.1 % (ref 36.0–46.0)
Hemoglobin: 12.2 g/dL (ref 12.0–15.0)
Immature Granulocytes: 0 %
Lymphocytes Relative: 20 %
Lymphs Abs: 1.8 10*3/uL (ref 0.7–4.0)
MCH: 32.7 pg (ref 26.0–34.0)
MCHC: 33.8 g/dL (ref 30.0–36.0)
MCV: 96.8 fL (ref 80.0–100.0)
Monocytes Absolute: 0.6 10*3/uL (ref 0.1–1.0)
Monocytes Relative: 6 %
Neutro Abs: 6.4 10*3/uL (ref 1.7–7.7)
Neutrophils Relative %: 72 %
Platelets: 263 10*3/uL (ref 150–400)
RBC: 3.73 MIL/uL — ABNORMAL LOW (ref 3.87–5.11)
RDW: 12.7 % (ref 11.5–15.5)
WBC: 9 10*3/uL (ref 4.0–10.5)
nRBC: 0 % (ref 0.0–0.2)

## 2019-05-12 LAB — BASIC METABOLIC PANEL
Anion gap: 13 (ref 5–15)
BUN: 5 mg/dL — ABNORMAL LOW (ref 6–20)
CO2: 23 mmol/L (ref 22–32)
Calcium: 9.3 mg/dL (ref 8.9–10.3)
Chloride: 103 mmol/L (ref 98–111)
Creatinine, Ser: 0.97 mg/dL (ref 0.44–1.00)
GFR calc Af Amer: >60 mL/min (ref >60)
GFR calc non Af Amer: >60 mL/min (ref >60)
Glucose, Bld: 111 mg/dL — ABNORMAL HIGH (ref 70–99)
Potassium: 3.6 mmol/L (ref 3.5–5.1)
Sodium: 139 mmol/L (ref 135–145)

## 2019-05-12 MED ORDER — ONDANSETRON 4 MG PO TBDP
4.0000 mg | ORAL_TABLET | Freq: Once | ORAL | Status: AC
  Administered 2019-05-12: 4 mg via ORAL
  Filled 2019-05-12: qty 1

## 2019-05-12 MED ORDER — ACETAMINOPHEN 500 MG PO TABS
1000.0000 mg | ORAL_TABLET | Freq: Once | ORAL | Status: AC
  Administered 2019-05-12: 1000 mg via ORAL
  Filled 2019-05-12: qty 2

## 2019-05-12 MED ORDER — KETOROLAC TROMETHAMINE 30 MG/ML IJ SOLN
30.0000 mg | Freq: Once | INTRAMUSCULAR | Status: AC
  Administered 2019-05-12: 17:00:00 30 mg via INTRAMUSCULAR
  Filled 2019-05-12: qty 1

## 2019-05-12 NOTE — ED Notes (Signed)
Patient transported to CT 

## 2019-05-12 NOTE — Discharge Instructions (Addendum)
Your labs and CT scan are very reassuring today. Continue treating your symptoms with over-the-counter medications. Follow up with your primary care provider if symptoms persist.

## 2019-05-12 NOTE — ED Triage Notes (Signed)
Pt arrives with complaints of back pain. States she thought she had a kidney infection. Went to urgent care. They did a urine culture and it was negative for infection. Recommended getting a CT to check for stones. Endorses pain 8/10 X 3 weeks

## 2019-05-12 NOTE — ED Notes (Signed)
Pt verbalized understanding of d/c instructions and follow up care.  Pt ambulated to lobby with steady gait 
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
## 2019-05-13 LAB — URINE CULTURE

## 2019-05-16 ENCOUNTER — Other Ambulatory Visit: Payer: Self-pay | Admitting: Preventative Medicine

## 2019-05-16 ENCOUNTER — Other Ambulatory Visit (HOSPITAL_COMMUNITY): Payer: Self-pay | Admitting: Internal Medicine

## 2019-05-16 ENCOUNTER — Other Ambulatory Visit: Payer: Self-pay | Admitting: Internal Medicine

## 2019-05-16 DIAGNOSIS — R109 Unspecified abdominal pain: Secondary | ICD-10-CM

## 2019-05-19 ENCOUNTER — Ambulatory Visit (HOSPITAL_COMMUNITY): Payer: Medicaid Other

## 2019-12-14 ENCOUNTER — Other Ambulatory Visit: Payer: Self-pay

## 2019-12-14 ENCOUNTER — Ambulatory Visit (HOSPITAL_COMMUNITY)
Admission: EM | Admit: 2019-12-14 | Discharge: 2019-12-14 | Disposition: A | Discharge status: Home / Self Care | Payer: Medicaid Other | Attending: Internal Medicine | Admitting: Internal Medicine

## 2019-12-14 ENCOUNTER — Encounter (HOSPITAL_COMMUNITY): Payer: Self-pay

## 2019-12-14 DIAGNOSIS — F17210 Nicotine dependence, cigarettes, uncomplicated: Secondary | ICD-10-CM | POA: Diagnosis not present

## 2019-12-14 DIAGNOSIS — Z20822 Contact with and (suspected) exposure to covid-19: Secondary | ICD-10-CM | POA: Insufficient documentation

## 2019-12-14 DIAGNOSIS — Z8719 Personal history of other diseases of the digestive system: Secondary | ICD-10-CM | POA: Diagnosis not present

## 2019-12-14 DIAGNOSIS — Z90710 Acquired absence of both cervix and uterus: Secondary | ICD-10-CM | POA: Diagnosis not present

## 2019-12-14 DIAGNOSIS — K909 Intestinal malabsorption, unspecified: Secondary | ICD-10-CM | POA: Insufficient documentation

## 2019-12-14 DIAGNOSIS — R112 Nausea with vomiting, unspecified: Secondary | ICD-10-CM | POA: Diagnosis present

## 2019-12-14 DIAGNOSIS — Z90722 Acquired absence of ovaries, bilateral: Secondary | ICD-10-CM | POA: Insufficient documentation

## 2019-12-14 DIAGNOSIS — Z79899 Other long term (current) drug therapy: Secondary | ICD-10-CM | POA: Insufficient documentation

## 2019-12-14 DIAGNOSIS — Z9049 Acquired absence of other specified parts of digestive tract: Secondary | ICD-10-CM | POA: Diagnosis not present

## 2019-12-14 LAB — CBC WITH DIFFERENTIAL/PLATELET
Abs Immature Granulocytes: 0.03 10*3/uL (ref 0.00–0.07)
Basophils Absolute: 0.1 10*3/uL (ref 0.0–0.1)
Basophils Relative: 1 %
Eosinophils Absolute: 0.1 10*3/uL (ref 0.0–0.5)
Eosinophils Relative: 1 %
HCT: 39.8 % (ref 36.0–46.0)
Hemoglobin: 13.6 g/dL (ref 12.0–15.0)
Immature Granulocytes: 0 %
Lymphocytes Relative: 22 %
Lymphs Abs: 2 10*3/uL (ref 0.7–4.0)
MCH: 33.5 pg (ref 26.0–34.0)
MCHC: 34.2 g/dL (ref 30.0–36.0)
MCV: 98 fL (ref 80.0–100.0)
Monocytes Absolute: 0.6 10*3/uL (ref 0.1–1.0)
Monocytes Relative: 7 %
Neutro Abs: 6.5 10*3/uL (ref 1.7–7.7)
Neutrophils Relative %: 69 %
Platelets: 301 10*3/uL (ref 150–400)
RBC: 4.06 MIL/uL (ref 3.87–5.11)
RDW: 12.6 % (ref 11.5–15.5)
WBC: 9.3 10*3/uL (ref 4.0–10.5)
nRBC: 0 % (ref 0.0–0.2)

## 2019-12-14 LAB — COMPREHENSIVE METABOLIC PANEL
ALT: 18 U/L (ref 0–44)
AST: 24 U/L (ref 15–41)
Albumin: 3.5 g/dL (ref 3.5–5.0)
Alkaline Phosphatase: 130 U/L — ABNORMAL HIGH (ref 38–126)
Anion gap: 8 (ref 5–15)
BUN: <5 mg/dL — ABNORMAL LOW (ref 6–20)
CO2: 27 mmol/L (ref 22–32)
Calcium: 8.9 mg/dL (ref 8.9–10.3)
Chloride: 107 mmol/L (ref 98–111)
Creatinine, Ser: 0.84 mg/dL (ref 0.44–1.00)
GFR calc Af Amer: >60 mL/min (ref >60)
GFR calc non Af Amer: >60 mL/min (ref >60)
Glucose, Bld: 93 mg/dL (ref 70–99)
Potassium: 3.7 mmol/L (ref 3.5–5.1)
Sodium: 142 mmol/L (ref 135–145)
Total Bilirubin: 0.7 mg/dL (ref 0.3–1.2)
Total Protein: 6 g/dL — ABNORMAL LOW (ref 6.5–8.1)

## 2019-12-14 LAB — LIPASE, BLOOD: Lipase: 36 U/L (ref 11–51)

## 2019-12-14 MED ORDER — ALUM & MAG HYDROXIDE-SIMETH 200-200-20 MG/5ML PO SUSP
30.0000 mL | Freq: Once | ORAL | Status: AC
  Administered 2019-12-14: 30 mL via ORAL

## 2019-12-14 MED ORDER — ALUM & MAG HYDROXIDE-SIMETH 200-200-20 MG/5ML PO SUSP
ORAL | Status: AC
  Filled 2019-12-14: qty 30

## 2019-12-14 MED ORDER — PANTOPRAZOLE SODIUM 20 MG PO TBEC: 20.0000 mg | DELAYED_RELEASE_TABLET | Freq: Two times a day (BID) | ORAL | 0 refills | Status: DC

## 2019-12-14 MED ORDER — ONDANSETRON 4 MG PO TBDP: 4.0000 mg | ORAL_TABLET | Freq: Three times a day (TID) | ORAL | 0 refills | Status: DC | PRN

## 2019-12-14 MED ORDER — LIDOCAINE VISCOUS HCL 2 % MT SOLN
OROMUCOSAL | Status: AC
  Filled 2019-12-14: qty 15

## 2019-12-14 MED ORDER — ONDANSETRON 4 MG PO TBDP
ORAL_TABLET | ORAL | Status: AC
  Filled 2019-12-14: qty 1

## 2019-12-14 MED ORDER — ONDANSETRON 4 MG PO TBDP
4.0000 mg | ORAL_TABLET | Freq: Once | ORAL | Status: AC
  Administered 2019-12-14: 4 mg via ORAL

## 2019-12-14 MED ORDER — LIDOCAINE VISCOUS HCL 2 % MT SOLN
15.0000 mL | Freq: Once | OROMUCOSAL | Status: AC
  Administered 2019-12-14: 15 mL via ORAL

## 2019-12-14 NOTE — ED Triage Notes (Signed)
Pt states she has chest pain x 2 weeks and nausea and diarrhea x 2 weeks.
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
## 2019-12-15 LAB — SARS CORONAVIRUS 2 (TAT 6-24 HRS): SARS Coronavirus 2: NEGATIVE

## 2019-12-18 ENCOUNTER — Encounter (HOSPITAL_COMMUNITY): Payer: Self-pay | Admitting: *Deleted

## 2019-12-18 ENCOUNTER — Other Ambulatory Visit: Payer: Self-pay

## 2019-12-18 ENCOUNTER — Emergency Department (HOSPITAL_COMMUNITY): Payer: Medicaid Other

## 2019-12-18 ENCOUNTER — Emergency Department (HOSPITAL_COMMUNITY)
Admission: EM | Admit: 2019-12-18 | Discharge: 2019-12-18 | Disposition: A | Discharge status: Home / Self Care | Payer: Medicaid Other | Attending: Emergency Medicine | Admitting: Emergency Medicine

## 2019-12-18 DIAGNOSIS — Z5321 Procedure and treatment not carried out due to patient leaving prior to being seen by health care provider: Secondary | ICD-10-CM | POA: Diagnosis not present

## 2019-12-18 DIAGNOSIS — R0789 Other chest pain: Secondary | ICD-10-CM | POA: Insufficient documentation

## 2019-12-18 LAB — CBC
HCT: 39 % (ref 36.0–46.0)
Hemoglobin: 13 g/dL (ref 12.0–15.0)
MCH: 33.4 pg (ref 26.0–34.0)
MCHC: 33.3 g/dL (ref 30.0–36.0)
MCV: 100.3 fL — ABNORMAL HIGH (ref 80.0–100.0)
Platelets: 278 10*3/uL (ref 150–400)
RBC: 3.89 MIL/uL (ref 3.87–5.11)
RDW: 12.5 % (ref 11.5–15.5)
WBC: 8.4 10*3/uL (ref 4.0–10.5)
nRBC: 0 % (ref 0.0–0.2)

## 2019-12-18 LAB — BASIC METABOLIC PANEL
Anion gap: 7 (ref 5–15)
BUN: <5 mg/dL — ABNORMAL LOW (ref 6–20)
CO2: 24 mmol/L (ref 22–32)
Calcium: 8.9 mg/dL (ref 8.9–10.3)
Chloride: 105 mmol/L (ref 98–111)
Creatinine, Ser: 0.93 mg/dL (ref 0.44–1.00)
GFR calc Af Amer: >60 mL/min (ref >60)
GFR calc non Af Amer: >60 mL/min (ref >60)
Glucose, Bld: 104 mg/dL — ABNORMAL HIGH (ref 70–99)
Potassium: 3.6 mmol/L (ref 3.5–5.1)
Sodium: 136 mmol/L (ref 135–145)

## 2019-12-18 LAB — I-STAT BETA HCG BLOOD, ED (MC, WL, AP ONLY): I-stat hCG, quantitative: 9.6 m[IU]/mL — ABNORMAL HIGH (ref <5)

## 2019-12-18 LAB — TROPONIN I (HIGH SENSITIVITY): Troponin I (High Sensitivity): <2 ng/L (ref <18)

## 2019-12-18 MED ORDER — SODIUM CHLORIDE 0.9% FLUSH: 3.0000 mL | Freq: Once | INTRAVENOUS | Status: DC

## 2019-12-18 NOTE — ED Triage Notes (Signed)
Pt reports sharp left chest pain that radiates through to shoulder blade x 2 weeks. Also has n/v/d. Was seen at [facility] on 3[facility] for same.

## 2019-12-18 NOTE — ED Notes (Signed)
No answer for room 

## 2019-12-18 NOTE — ED Notes (Signed)
No answer for vitals  

## 2020-02-10 ENCOUNTER — Other Ambulatory Visit: Payer: Self-pay

## 2020-02-10 ENCOUNTER — Ambulatory Visit (HOSPITAL_COMMUNITY)
Admission: EM | Admit: 2020-02-10 | Discharge: 2020-02-10 | Disposition: A | Discharge status: Home / Self Care | Payer: Medicaid Other | Attending: Urgent Care | Admitting: Urgent Care

## 2020-02-10 ENCOUNTER — Ambulatory Visit (INDEPENDENT_AMBULATORY_CARE_PROVIDER_SITE_OTHER): Payer: Medicaid Other

## 2020-02-10 ENCOUNTER — Encounter (HOSPITAL_COMMUNITY): Payer: Self-pay

## 2020-02-10 DIAGNOSIS — Z23 Encounter for immunization: Secondary | ICD-10-CM | POA: Diagnosis not present

## 2020-02-10 DIAGNOSIS — M79642 Pain in left hand: Secondary | ICD-10-CM

## 2020-02-10 DIAGNOSIS — W540XXA Bitten by dog, initial encounter: Secondary | ICD-10-CM

## 2020-02-10 DIAGNOSIS — M79645 Pain in left finger(s): Secondary | ICD-10-CM

## 2020-02-10 DIAGNOSIS — M7989 Other specified soft tissue disorders: Secondary | ICD-10-CM

## 2020-02-10 DIAGNOSIS — F1111 Opioid abuse, in remission: Secondary | ICD-10-CM

## 2020-02-10 MED ORDER — NAPROXEN 500 MG PO TABS: 500.0000 mg | ORAL_TABLET | Freq: Two times a day (BID) | ORAL | 0 refills | Status: DC

## 2020-02-10 MED ORDER — KETOROLAC TROMETHAMINE 60 MG/2ML IM SOLN
60.0000 mg | Freq: Once | INTRAMUSCULAR | Status: AC
  Administered 2020-02-10: 60 mg via INTRAMUSCULAR

## 2020-02-10 MED ORDER — TETANUS-DIPHTH-ACELL PERTUSSIS 5-2.5-18.5 LF-MCG/0.5 IM SUSP
INTRAMUSCULAR | Status: AC
  Filled 2020-02-10: qty 0.5

## 2020-02-10 MED ORDER — KETOROLAC TROMETHAMINE 60 MG/2ML IM SOLN
INTRAMUSCULAR | Status: AC
  Filled 2020-02-10: qty 2

## 2020-02-10 MED ORDER — AMOXICILLIN-POT CLAVULANATE 875-125 MG PO TABS: 1.0000 | ORAL_TABLET | Freq: Two times a day (BID) | ORAL | 0 refills | Status: DC

## 2020-02-10 MED ORDER — TETANUS-DIPHTH-ACELL PERTUSSIS 5-2.5-18.5 LF-MCG/0.5 IM SUSP
0.5000 mL | Freq: Once | INTRAMUSCULAR | Status: AC
  Administered 2020-02-10: 0.5 mL via INTRAMUSCULAR

## 2020-02-10 NOTE — Discharge Instructions (Signed)
You can take off the dressings in 24 hours. Use the hand splint for the next 2 days. Come back Monday for a recheck.
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
## 2020-02-10 NOTE — ED Triage Notes (Signed)
Pt states her 2 dogs were fighting and she tried to break them up and got bit on left hand. Pt states the dogs are up to date on vaccines. Pt has bandage on left hand. Bleeding is controlled.

## 2020-02-12 ENCOUNTER — Other Ambulatory Visit: Payer: Self-pay

## 2020-02-12 ENCOUNTER — Ambulatory Visit (HOSPITAL_COMMUNITY)
Admission: EM | Admit: 2020-02-12 | Discharge: 2020-02-12 | Disposition: A | Discharge status: Home / Self Care | Payer: Medicaid Other | Attending: Family Medicine | Admitting: Family Medicine

## 2020-02-12 ENCOUNTER — Encounter (HOSPITAL_COMMUNITY): Payer: Self-pay

## 2020-02-12 DIAGNOSIS — W540XXD Bitten by dog, subsequent encounter: Secondary | ICD-10-CM

## 2020-02-12 DIAGNOSIS — M79642 Pain in left hand: Secondary | ICD-10-CM | POA: Diagnosis not present

## 2020-02-12 MED ORDER — FLUCONAZOLE 150 MG PO TABS: ORAL_TABLET | ORAL | 0 refills | Status: DC

## 2020-02-12 MED ORDER — HYDROCODONE-ACETAMINOPHEN 5-325 MG PO TABS: 1.0000 | ORAL_TABLET | Freq: Four times a day (QID) | ORAL | 0 refills | Status: DC | PRN

## 2020-02-12 NOTE — Discharge Instructions (Addendum)
Continue with the Augmentin  Take the Norco one tablet every 6 hours as needed for moderate to severe pain  Follow up tomorrow if there is no improvement in your hand
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
## 2020-02-17 ENCOUNTER — Other Ambulatory Visit: Payer: Self-pay

## 2020-02-17 ENCOUNTER — Ambulatory Visit (HOSPITAL_COMMUNITY)
Admission: EM | Admit: 2020-02-17 | Discharge: 2020-02-17 | Disposition: A | Discharge status: Home / Self Care | Payer: Medicaid Other | Attending: Internal Medicine | Admitting: Internal Medicine

## 2020-02-17 ENCOUNTER — Encounter (HOSPITAL_COMMUNITY): Payer: Self-pay

## 2020-02-17 DIAGNOSIS — S61452S Open bite of left hand, sequela: Secondary | ICD-10-CM | POA: Diagnosis not present

## 2020-02-17 DIAGNOSIS — W540XXS Bitten by dog, sequela: Secondary | ICD-10-CM

## 2020-02-17 MED ORDER — HYDROCODONE-ACETAMINOPHEN 5-325 MG PO TABS: 2.0000 | ORAL_TABLET | Freq: Four times a day (QID) | ORAL | 0 refills | Status: DC | PRN
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
## 2020-02-17 NOTE — ED Triage Notes (Signed)
Patient here for f/u from visit Monday. Reports that the pain in her hand has gotten worse, and reports the site on her leg seems to be getting more inflamed and erythremic.

## 2020-07-10 ENCOUNTER — Inpatient Hospital Stay (HOSPITAL_COMMUNITY)
Admission: EM | Admit: 2020-07-10 | Discharge: 2020-08-07 | DRG: 439 | Disposition: A | Discharge status: Home / Self Care | Payer: Medicaid Other | Attending: Internal Medicine | Admitting: Internal Medicine

## 2020-07-10 ENCOUNTER — Encounter (HOSPITAL_COMMUNITY): Payer: Self-pay | Admitting: Emergency Medicine

## 2020-07-10 ENCOUNTER — Ambulatory Visit (INDEPENDENT_AMBULATORY_CARE_PROVIDER_SITE_OTHER)
Admission: EM | Admit: 2020-07-10 | Discharge: 2020-07-10 | Disposition: A | Discharge status: Home / Self Care | Payer: Medicaid Other | Source: Home / Self Care | Attending: Family Medicine | Admitting: Family Medicine

## 2020-07-10 ENCOUNTER — Other Ambulatory Visit: Payer: Self-pay

## 2020-07-10 ENCOUNTER — Ambulatory Visit (INDEPENDENT_AMBULATORY_CARE_PROVIDER_SITE_OTHER): Payer: Medicaid Other

## 2020-07-10 ENCOUNTER — Emergency Department (HOSPITAL_COMMUNITY): Payer: Medicaid Other

## 2020-07-10 DIAGNOSIS — E872 Acidosis: Secondary | ICD-10-CM | POA: Diagnosis present

## 2020-07-10 DIAGNOSIS — F332 Major depressive disorder, recurrent severe without psychotic features: Secondary | ICD-10-CM | POA: Diagnosis present

## 2020-07-10 DIAGNOSIS — E8770 Fluid overload, unspecified: Secondary | ICD-10-CM | POA: Diagnosis not present

## 2020-07-10 DIAGNOSIS — F32A Depression, unspecified: Secondary | ICD-10-CM | POA: Diagnosis not present

## 2020-07-10 DIAGNOSIS — Z87891 Personal history of nicotine dependence: Secondary | ICD-10-CM

## 2020-07-10 DIAGNOSIS — B3781 Candidal esophagitis: Secondary | ICD-10-CM | POA: Diagnosis present

## 2020-07-10 DIAGNOSIS — K648 Other hemorrhoids: Secondary | ICD-10-CM | POA: Diagnosis present

## 2020-07-10 DIAGNOSIS — E46 Unspecified protein-calorie malnutrition: Secondary | ICD-10-CM | POA: Diagnosis present

## 2020-07-10 DIAGNOSIS — Z79899 Other long term (current) drug therapy: Secondary | ICD-10-CM

## 2020-07-10 DIAGNOSIS — K589 Irritable bowel syndrome without diarrhea: Secondary | ICD-10-CM | POA: Diagnosis present

## 2020-07-10 DIAGNOSIS — K8582 Other acute pancreatitis with infected necrosis: Principal | ICD-10-CM | POA: Diagnosis present

## 2020-07-10 DIAGNOSIS — D75838 Other thrombocytosis: Secondary | ICD-10-CM | POA: Diagnosis not present

## 2020-07-10 DIAGNOSIS — Z978 Presence of other specified devices: Secondary | ICD-10-CM | POA: Diagnosis not present

## 2020-07-10 DIAGNOSIS — R1012 Left upper quadrant pain: Secondary | ICD-10-CM | POA: Diagnosis not present

## 2020-07-10 DIAGNOSIS — Z4659 Encounter for fitting and adjustment of other gastrointestinal appliance and device: Secondary | ICD-10-CM

## 2020-07-10 DIAGNOSIS — K2970 Gastritis, unspecified, without bleeding: Secondary | ICD-10-CM | POA: Diagnosis not present

## 2020-07-10 DIAGNOSIS — A0472 Enterocolitis due to Clostridium difficile, not specified as recurrent: Secondary | ICD-10-CM | POA: Diagnosis present

## 2020-07-10 DIAGNOSIS — F1120 Opioid dependence, uncomplicated: Secondary | ICD-10-CM | POA: Diagnosis present

## 2020-07-10 DIAGNOSIS — K863 Pseudocyst of pancreas: Secondary | ICD-10-CM | POA: Diagnosis present

## 2020-07-10 DIAGNOSIS — K8500 Idiopathic acute pancreatitis without necrosis or infection: Secondary | ICD-10-CM | POA: Diagnosis not present

## 2020-07-10 DIAGNOSIS — Z90710 Acquired absence of both cervix and uterus: Secondary | ICD-10-CM

## 2020-07-10 DIAGNOSIS — F333 Major depressive disorder, recurrent, severe with psychotic symptoms: Secondary | ICD-10-CM | POA: Diagnosis not present

## 2020-07-10 DIAGNOSIS — E860 Dehydration: Secondary | ICD-10-CM | POA: Diagnosis present

## 2020-07-10 DIAGNOSIS — G8929 Other chronic pain: Secondary | ICD-10-CM | POA: Diagnosis present

## 2020-07-10 DIAGNOSIS — K219 Gastro-esophageal reflux disease without esophagitis: Secondary | ICD-10-CM | POA: Diagnosis present

## 2020-07-10 DIAGNOSIS — Z9049 Acquired absence of other specified parts of digestive tract: Secondary | ICD-10-CM

## 2020-07-10 DIAGNOSIS — Z90722 Acquired absence of ovaries, bilateral: Secondary | ICD-10-CM | POA: Diagnosis not present

## 2020-07-10 DIAGNOSIS — D638 Anemia in other chronic diseases classified elsewhere: Secondary | ICD-10-CM | POA: Diagnosis present

## 2020-07-10 DIAGNOSIS — Z23 Encounter for immunization: Secondary | ICD-10-CM

## 2020-07-10 DIAGNOSIS — R112 Nausea with vomiting, unspecified: Secondary | ICD-10-CM

## 2020-07-10 DIAGNOSIS — K2990 Gastroduodenitis, unspecified, without bleeding: Secondary | ICD-10-CM | POA: Diagnosis present

## 2020-07-10 DIAGNOSIS — K859 Acute pancreatitis without necrosis or infection, unspecified: Secondary | ICD-10-CM | POA: Diagnosis not present

## 2020-07-10 DIAGNOSIS — R197 Diarrhea, unspecified: Secondary | ICD-10-CM | POA: Diagnosis not present

## 2020-07-10 DIAGNOSIS — Z20822 Contact with and (suspected) exposure to covid-19: Secondary | ICD-10-CM | POA: Diagnosis present

## 2020-07-10 DIAGNOSIS — E876 Hypokalemia: Secondary | ICD-10-CM | POA: Diagnosis present

## 2020-07-10 DIAGNOSIS — K631 Perforation of intestine (nontraumatic): Secondary | ICD-10-CM

## 2020-07-10 DIAGNOSIS — N809 Endometriosis, unspecified: Secondary | ICD-10-CM | POA: Diagnosis present

## 2020-07-10 DIAGNOSIS — K449 Diaphragmatic hernia without obstruction or gangrene: Secondary | ICD-10-CM | POA: Diagnosis present

## 2020-07-10 DIAGNOSIS — K3189 Other diseases of stomach and duodenum: Secondary | ICD-10-CM | POA: Diagnosis not present

## 2020-07-10 DIAGNOSIS — R000 Tachycardia, unspecified: Secondary | ICD-10-CM | POA: Diagnosis not present

## 2020-07-10 DIAGNOSIS — K8591 Acute pancreatitis with uninfected necrosis, unspecified: Secondary | ICD-10-CM | POA: Diagnosis not present

## 2020-07-10 DIAGNOSIS — K862 Cyst of pancreas: Secondary | ICD-10-CM | POA: Diagnosis not present

## 2020-07-10 LAB — TROPONIN I (HIGH SENSITIVITY)
Troponin I (High Sensitivity): 3 ng/L (ref <18)
Troponin I (High Sensitivity): 3 ng/L (ref <18)

## 2020-07-10 LAB — LIPASE, BLOOD
Lipase: 151 U/L — ABNORMAL HIGH (ref 11–51)
Lipase: 128 U/L — ABNORMAL HIGH (ref 11–51)

## 2020-07-10 LAB — COMPREHENSIVE METABOLIC PANEL
ALT: 12 U/L (ref 0–44)
ALT: 10 U/L (ref 0–44)
AST: 13 U/L — ABNORMAL LOW (ref 15–41)
AST: 13 U/L — ABNORMAL LOW (ref 15–41)
Albumin: 2.9 g/dL — ABNORMAL LOW (ref 3.5–5.0)
Albumin: 2.6 g/dL — ABNORMAL LOW (ref 3.5–5.0)
Alkaline Phosphatase: 230 U/L — ABNORMAL HIGH (ref 38–126)
Alkaline Phosphatase: 194 U/L — ABNORMAL HIGH (ref 38–126)
Anion gap: 14 (ref 5–15)
Anion gap: 13 (ref 5–15)
BUN: 6 mg/dL (ref 6–20)
BUN: 6 mg/dL (ref 6–20)
CO2: 22 mmol/L (ref 22–32)
CO2: 20 mmol/L — ABNORMAL LOW (ref 22–32)
Calcium: 9 mg/dL (ref 8.9–10.3)
Calcium: 8.5 mg/dL — ABNORMAL LOW (ref 8.9–10.3)
Chloride: 107 mmol/L (ref 98–111)
Chloride: 106 mmol/L (ref 98–111)
Creatinine, Ser: 0.81 mg/dL (ref 0.44–1.00)
Creatinine, Ser: 0.75 mg/dL (ref 0.44–1.00)
GFR, Estimated: >60 mL/min (ref >60)
GFR, Estimated: >60 mL/min (ref >60)
Glucose, Bld: 96 mg/dL (ref 70–99)
Glucose, Bld: 90 mg/dL (ref 70–99)
Potassium: 3 mmol/L — ABNORMAL LOW (ref 3.5–5.1)
Potassium: 3.1 mmol/L — ABNORMAL LOW (ref 3.5–5.1)
Sodium: 143 mmol/L (ref 135–145)
Sodium: 139 mmol/L (ref 135–145)
Total Bilirubin: 0.4 mg/dL (ref 0.3–1.2)
Total Bilirubin: 0.5 mg/dL (ref 0.3–1.2)
Total Protein: 6.7 g/dL (ref 6.5–8.1)
Total Protein: 5.9 g/dL — ABNORMAL LOW (ref 6.5–8.1)

## 2020-07-10 LAB — CBC WITH DIFFERENTIAL/PLATELET
Abs Immature Granulocytes: 0.1 10*3/uL — ABNORMAL HIGH (ref 0.00–0.07)
Basophils Absolute: 0.1 10*3/uL (ref 0.0–0.1)
Basophils Relative: 1 %
Eosinophils Absolute: 0.6 10*3/uL — ABNORMAL HIGH (ref 0.0–0.5)
Eosinophils Relative: 4 %
HCT: 38 % (ref 36.0–46.0)
Hemoglobin: 12.4 g/dL (ref 12.0–15.0)
Immature Granulocytes: 1 %
Lymphocytes Relative: 14 %
Lymphs Abs: 2.1 10*3/uL (ref 0.7–4.0)
MCH: 31.2 pg (ref 26.0–34.0)
MCHC: 32.6 g/dL (ref 30.0–36.0)
MCV: 95.5 fL (ref 80.0–100.0)
Monocytes Absolute: 0.6 10*3/uL (ref 0.1–1.0)
Monocytes Relative: 4 %
Neutro Abs: 11.1 10*3/uL — ABNORMAL HIGH (ref 1.7–7.7)
Neutrophils Relative %: 76 %
Platelets: 681 10*3/uL — ABNORMAL HIGH (ref 150–400)
RBC: 3.98 MIL/uL (ref 3.87–5.11)
RDW: 12.5 % (ref 11.5–15.5)
WBC: 14.6 10*3/uL — ABNORMAL HIGH (ref 4.0–10.5)
nRBC: 0 % (ref 0.0–0.2)

## 2020-07-10 LAB — CBC
HCT: 34.2 % — ABNORMAL LOW (ref 36.0–46.0)
Hemoglobin: 11.2 g/dL — ABNORMAL LOW (ref 12.0–15.0)
MCH: 32 pg (ref 26.0–34.0)
MCHC: 32.7 g/dL (ref 30.0–36.0)
MCV: 97.7 fL (ref 80.0–100.0)
Platelets: 619 10*3/uL — ABNORMAL HIGH (ref 150–400)
RBC: 3.5 MIL/uL — ABNORMAL LOW (ref 3.87–5.11)
RDW: 12.7 % (ref 11.5–15.5)
WBC: 13.7 10*3/uL — ABNORMAL HIGH (ref 4.0–10.5)
nRBC: 0 % (ref 0.0–0.2)

## 2020-07-10 LAB — URINALYSIS, ROUTINE W REFLEX MICROSCOPIC
Bilirubin Urine: NEGATIVE
Glucose, UA: NEGATIVE mg/dL
Hgb urine dipstick: NEGATIVE
Ketones, ur: NEGATIVE mg/dL
Leukocytes,Ua: NEGATIVE
Nitrite: NEGATIVE
Protein, ur: NEGATIVE mg/dL
Specific Gravity, Urine: 1.003 — ABNORMAL LOW (ref 1.005–1.030)
pH: 7 (ref 5.0–8.0)

## 2020-07-10 MED ORDER — MOMETASONE FURO-FORMOTEROL FUM 100-5 MCG/ACT IN AERO
2.0000 | INHALATION_SPRAY | Freq: Two times a day (BID) | RESPIRATORY_TRACT | Status: DC
  Administered 2020-07-11 – 2020-08-07 (×52): 2 via RESPIRATORY_TRACT
  Filled 2020-07-10 (×5): qty 8.8

## 2020-07-10 MED ORDER — ALUM & MAG HYDROXIDE-SIMETH 200-200-20 MG/5ML PO SUSP
ORAL | Status: AC
  Filled 2020-07-10: qty 30

## 2020-07-10 MED ORDER — ENOXAPARIN SODIUM 40 MG/0.4ML ~~LOC~~ SOLN
40.0000 mg | SUBCUTANEOUS | Status: DC
  Administered 2020-07-11 – 2020-07-20 (×10): 40 mg via SUBCUTANEOUS
  Filled 2020-07-10 (×11): qty 0.4

## 2020-07-10 MED ORDER — FENTANYL CITRATE (PF) 100 MCG/2ML IJ SOLN
50.0000 ug | Freq: Once | INTRAMUSCULAR | Status: AC
  Administered 2020-07-10: 50 ug via INTRAVENOUS
  Filled 2020-07-10: qty 2

## 2020-07-10 MED ORDER — IOHEXOL 300 MG/ML  SOLN
100.0000 mL | Freq: Once | INTRAMUSCULAR | Status: AC | PRN
  Administered 2020-07-10: 100 mL via INTRAVENOUS

## 2020-07-10 MED ORDER — HYDROMORPHONE HCL 1 MG/ML IJ SOLN
0.5000 mg | Freq: Once | INTRAMUSCULAR | Status: AC
  Administered 2020-07-11: 0.5 mg via INTRAVENOUS
  Filled 2020-07-10: qty 1

## 2020-07-10 MED ORDER — ONDANSETRON 4 MG PO TBDP
4.0000 mg | ORAL_TABLET | Freq: Once | ORAL | Status: AC
  Administered 2020-07-10: 4 mg via ORAL

## 2020-07-10 MED ORDER — POTASSIUM CHLORIDE 10 MEQ/100ML IV SOLN
10.0000 meq | INTRAVENOUS | Status: AC
  Administered 2020-07-11 (×6): 10 meq via INTRAVENOUS
  Filled 2020-07-10 (×7): qty 100

## 2020-07-10 MED ORDER — LIDOCAINE VISCOUS HCL 2 % MT SOLN
15.0000 mL | Freq: Once | OROMUCOSAL | Status: AC
  Administered 2020-07-10: 15 mL via ORAL

## 2020-07-10 MED ORDER — ONDANSETRON HCL 4 MG PO TABS
4.0000 mg | ORAL_TABLET | Freq: Four times a day (QID) | ORAL | Status: DC | PRN
  Administered 2020-07-12 – 2020-07-18 (×10): 4 mg via ORAL
  Filled 2020-07-10 (×10): qty 1

## 2020-07-10 MED ORDER — SODIUM CHLORIDE 0.9% FLUSH
3.0000 mL | Freq: Two times a day (BID) | INTRAVENOUS | Status: DC
  Administered 2020-07-11 – 2020-08-07 (×34): 3 mL via INTRAVENOUS

## 2020-07-10 MED ORDER — LACTATED RINGERS IV SOLN: INTRAVENOUS | Status: DC

## 2020-07-10 MED ORDER — ALUM & MAG HYDROXIDE-SIMETH 200-200-20 MG/5ML PO SUSP
30.0000 mL | Freq: Once | ORAL | Status: AC
  Administered 2020-07-10: 30 mL via ORAL

## 2020-07-10 MED ORDER — SODIUM CHLORIDE 0.9 % IV BOLUS
1000.0000 mL | Freq: Once | INTRAVENOUS | Status: AC
  Administered 2020-07-10: 1000 mL via INTRAVENOUS

## 2020-07-10 MED ORDER — ALBUTEROL SULFATE HFA 108 (90 BASE) MCG/ACT IN AERS
1.0000 | INHALATION_SPRAY | RESPIRATORY_TRACT | Status: DC | PRN
  Filled 2020-07-10: qty 6.7

## 2020-07-10 MED ORDER — ACETAMINOPHEN 325 MG PO TABS: 650.0000 mg | ORAL_TABLET | Freq: Four times a day (QID) | ORAL | Status: DC | PRN

## 2020-07-10 MED ORDER — HYDROMORPHONE HCL 1 MG/ML IJ SOLN
0.5000 mg | Freq: Once | INTRAMUSCULAR | Status: AC
  Administered 2020-07-10: 0.5 mg via INTRAVENOUS

## 2020-07-10 MED ORDER — ONDANSETRON HCL 4 MG/2ML IJ SOLN
4.0000 mg | Freq: Once | INTRAMUSCULAR | Status: AC
  Administered 2020-07-10: 4 mg via INTRAVENOUS
  Filled 2020-07-10: qty 2

## 2020-07-10 MED ORDER — ONDANSETRON 4 MG PO TBDP
ORAL_TABLET | ORAL | Status: AC
  Filled 2020-07-10: qty 1

## 2020-07-10 MED ORDER — LIDOCAINE VISCOUS HCL 2 % MT SOLN
OROMUCOSAL | Status: AC
  Filled 2020-07-10: qty 15

## 2020-07-10 MED ORDER — ACETAMINOPHEN 650 MG RE SUPP: 650.0000 mg | Freq: Four times a day (QID) | RECTAL | Status: DC | PRN

## 2020-07-10 MED ORDER — ESTRADIOL 1 MG PO TABS
1.0000 mg | ORAL_TABLET | Freq: Every day | ORAL | Status: DC
  Administered 2020-07-11 – 2020-07-18 (×8): 1 mg via ORAL
  Filled 2020-07-10 (×8): qty 1

## 2020-07-10 MED ORDER — ONDANSETRON HCL 4 MG/2ML IJ SOLN
4.0000 mg | Freq: Four times a day (QID) | INTRAMUSCULAR | Status: DC | PRN
  Administered 2020-07-10 – 2020-07-18 (×13): 4 mg via INTRAVENOUS
  Filled 2020-07-10 (×13): qty 2

## 2020-07-10 MED ORDER — PANTOPRAZOLE SODIUM 40 MG PO TBEC
40.0000 mg | DELAYED_RELEASE_TABLET | Freq: Every day | ORAL | Status: DC
  Administered 2020-07-11 – 2020-07-16 (×6): 40 mg via ORAL
  Filled 2020-07-10 (×6): qty 1

## 2020-07-10 MED ORDER — HYDROMORPHONE HCL 1 MG/ML IJ SOLN
0.5000 mg | INTRAMUSCULAR | Status: DC | PRN
  Administered 2020-07-11 – 2020-07-14 (×21): 0.5 mg via INTRAVENOUS
  Filled 2020-07-10 (×22): qty 1

## 2020-07-10 NOTE — ED Triage Notes (Signed)
PT sent from [facility].  Reports LUQ pain since Saturday with nausea, vomiting, and diarrhea. History of IBS.  States the pain is radiating into L chest now.
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
## 2020-07-10 NOTE — ED Triage Notes (Signed)
Pt presents with swelling and sharp pain in abdominal area xs 4 days. States has hx of IBS. States unable to keep food down and pain is going into her chest.

## 2020-07-10 NOTE — ED Notes (Signed)
Pt to CT

## 2020-07-11 ENCOUNTER — Inpatient Hospital Stay (HOSPITAL_COMMUNITY): Payer: Medicaid Other

## 2020-07-11 ENCOUNTER — Encounter (HOSPITAL_COMMUNITY): Payer: Self-pay | Admitting: Internal Medicine

## 2020-07-11 DIAGNOSIS — K859 Acute pancreatitis without necrosis or infection, unspecified: Secondary | ICD-10-CM | POA: Diagnosis not present

## 2020-07-11 LAB — CBC
HCT: 31.7 % — ABNORMAL LOW (ref 36.0–46.0)
Hemoglobin: 10.2 g/dL — ABNORMAL LOW (ref 12.0–15.0)
MCH: 31.5 pg (ref 26.0–34.0)
MCHC: 32.2 g/dL (ref 30.0–36.0)
MCV: 97.8 fL (ref 80.0–100.0)
Platelets: 512 10*3/uL — ABNORMAL HIGH (ref 150–400)
RBC: 3.24 MIL/uL — ABNORMAL LOW (ref 3.87–5.11)
RDW: 12.6 % (ref 11.5–15.5)
WBC: 9.6 10*3/uL (ref 4.0–10.5)
nRBC: 0 % (ref 0.0–0.2)

## 2020-07-11 LAB — COMPREHENSIVE METABOLIC PANEL
ALT: 9 U/L (ref 0–44)
AST: 11 U/L — ABNORMAL LOW (ref 15–41)
Albumin: 2.3 g/dL — ABNORMAL LOW (ref 3.5–5.0)
Alkaline Phosphatase: 183 U/L — ABNORMAL HIGH (ref 38–126)
Anion gap: 10 (ref 5–15)
BUN: 5 mg/dL — ABNORMAL LOW (ref 6–20)
CO2: 21 mmol/L — ABNORMAL LOW (ref 22–32)
Calcium: 8.5 mg/dL — ABNORMAL LOW (ref 8.9–10.3)
Chloride: 110 mmol/L (ref 98–111)
Creatinine, Ser: 0.72 mg/dL (ref 0.44–1.00)
GFR, Estimated: >60 mL/min (ref >60)
Glucose, Bld: 67 mg/dL — ABNORMAL LOW (ref 70–99)
Potassium: 3.8 mmol/L (ref 3.5–5.1)
Sodium: 141 mmol/L (ref 135–145)
Total Bilirubin: 0.5 mg/dL (ref 0.3–1.2)
Total Protein: 5.5 g/dL — ABNORMAL LOW (ref 6.5–8.1)

## 2020-07-11 LAB — MAGNESIUM: Magnesium: 1.9 mg/dL (ref 1.7–2.4)

## 2020-07-11 LAB — HIV ANTIBODY (ROUTINE TESTING W REFLEX): HIV Screen 4th Generation wRfx: NONREACTIVE

## 2020-07-11 LAB — RESPIRATORY PANEL BY RT PCR (FLU A&B, COVID)
Influenza A by PCR: NEGATIVE
Influenza B by PCR: NEGATIVE
SARS Coronavirus 2 by RT PCR: NEGATIVE

## 2020-07-11 LAB — TRIGLYCERIDES: Triglycerides: 71 mg/dL (ref <150)

## 2020-07-11 MED ORDER — INFLUENZA VAC SPLIT QUAD 0.5 ML IM SUSY
0.5000 mL | PREFILLED_SYRINGE | INTRAMUSCULAR | Status: AC
  Administered 2020-07-12: 0.5 mL via INTRAMUSCULAR
  Filled 2020-07-11: qty 0.5

## 2020-07-11 MED ORDER — LACTATED RINGERS IV SOLN: INTRAVENOUS | Status: AC

## 2020-07-11 MED ORDER — LACTATED RINGERS IV SOLN: INTRAVENOUS | Status: DC

## 2020-07-11 MED ORDER — GADOBUTROL 1 MMOL/ML IV SOLN
6.0000 mL | Freq: Once | INTRAVENOUS | Status: AC | PRN
  Administered 2020-07-11: 6 mL via INTRAVENOUS

## 2020-07-11 NOTE — Progress Notes (Signed)
PROGRESS NOTE    Tricia Brown  RKY:[id number] DOB: 29-Feb-1976 DOA: 07/10/2020 PCP: Lorelee Market, MD   Chief Complaint  Patient presents with   Abdominal Pain   Chest Pain   Brief Narrative: As per admitting: 44 y.o. female with medical history significant of endometriosis status post diagnostic laparoscopy and hysterectomy, IBS-D, GERD, status post cholecystectomy presents with abdominal pain x4 days, epigastric and left upper quadrant with associated nausea, vomiting and diarrhea, pain rated 4 out of 10 and when flares of up to 8 out of 10 with certain movement and following meals, not improved by Tums and daily PPI at home.  Drinks alcohol about sixpack on occasion on weekends. In the ED vitals stable labs with hypokalemia 3.0 alk phos 230, leukocytosis 14 K lipase 151 troponin x2 - UA normal CT abdomen showed pancreatitis with fluid collection consistent with pseudocyst reported as "stranding noted around the pancreatic body and tail compatible with acute pancreatitis. Adjacent fluid collections, the largest 3.8 cm most compatible with acute fluid collections and developing pseudocysts. Secondary reactive inflammation of the adjacent gastric wall. Small to moderate free fluid in the pelvis"  Patient was given IV fluids pain medication Zofran and admission was requested.  Subjective: Patient reports abdomen pain but improving with Dilaudid. afebrile overnight. Denies chest pain shortness of breath.  Chills. Assessment & Plan:  Acute pancreatitis with developing pseudocyst largest 3.8 cm: History of cholecystectomy, does drink alcohol which could be the etiology.  Normal TG level. UA unremarkable blood cultures were sent, currently on IV Dilaudid 0.5 mg every 3 hours, n.p.o. and advance diet as tolerated.  Will keep on aggressive ivf hydration and increase RL, consulted [facility] gastroenterology.  LFTs are stable, calcium 8.5 BUN 0.75.  Mild metabolic acidosis/dehydration due to  #1.  Chronic back pain, cont pain control  Depression: Continue home meds.  Hypokalemia: Replete and recheck.  Leukocytosis likely in the setting of normal ROM.  Monitor.  Nutrition: Diet Order            Diet NPO time specified Except for: Sips with Meds, Ice Chips  Diet effective now                 There is no height or weight on file to calculate BMI. DVT prophylaxis: enoxaparin (LOVENOX) injection 40 mg Start: 07/11/20 1000 Code Status:   Code Status: Full Code  Family Communication: plan of care discssed with patient at bedside.  Status is: Inpatient Remains inpatient appropriate because:IV treatments appropriate due to intensity of illness or inability to take PO, Inpatient level of care appropriate due to severity of illness and Ongoing management of acute pancreatitis with pseudocyst  Dispo: The patient is from: Home              Anticipated d/c is to: Home              Anticipated d/c date is: 3 days              Patient currently is not medically stable to d/c.  Consultants:see note  Procedures:see note  Culture/Microbiology    Component Value Date/Time   SDES URINE, CLEAN CATCH 05/12/2019 1507   SPECREQUEST  05/12/2019 1507    NONE Performed at [facility], [place] [address], [place], [place] [address]    CULT  05/12/2019 1507    Multiple bacterial morphotypes present, none predominant. Suggest appropriate recollection if clinically indicated.   REPTSTATUS 05/13/2019 FINAL 05/12/2019 1507    Other  culture-see note  Medications: Scheduled Meds:  enoxaparin (LOVENOX) injection  40 mg Subcutaneous Q24H   estradiol  1 mg Oral Daily   [START ON 07/12/2020] influenza vac split quadrivalent PF  0.5 mL Intramuscular Tomorrow-1000   mometasone-formoterol  2 puff Inhalation BID   pantoprazole  40 mg Oral Daily   sodium chloride flush  3 mL Intravenous Q12H   Continuous Infusions:  lactated ringers 200 mL/hr at 07/11/20 0801   lactated ringers       Antimicrobials: Anti-infectives (From admission, onward)   None     Objective: Vitals: Today's Vitals   07/11/20 0756 07/11/20 0826 07/11/20 0908 07/11/20 0950  BP:   127/79 134/76  Pulse:    72  Resp:    18  Temp:   98.2 F (36.8 C) 98.4 F (36.9 C)  TempSrc:   Oral Oral  SpO2:   100% 100%  PainSc: $Remove'8  3  4      'KIeGCeW$ Intake/Output Summary (Last 24 hours) at 07/11/2020 1155 Last data filed at 07/11/2020 0446 Gross per 24 hour  Intake 1845.83 ml  Output --  Net 1845.83 ml   There were no vitals filed for this visit. Weight change:   Intake/Output from previous day: 10/20 0701 - 10/21 0700 In: 1845.8 [I.V.:445.8; IV Piggyback:1400] Out: -  Intake/Output this shift: No intake/output data recorded.  Examination: General exam: AAOx3 ,NAD, weak appearing. HEENT:Oral mucosa moist, Ear/Nose WNL grossly,dentition normal. Respiratory system: bilaterally clear,no wheezing or crackles,no use of accessory muscle, non tender. Cardiovascular system: S1 & S2 +, regular, No JVD. Gastrointestinal system: Abdomen soft,tender on the mid abdomen,ND, BS+. Nervous System:Alert, awake, moving extremities and grossly nonfocal Extremities: No edema, distal peripheral pulses palpable.  Skin: No rashes,no icterus. MSK: Normal muscle bulk,tone, power  Data Reviewed: I have personally reviewed following labs and imaging studies CBC: Recent Labs  Lab 07/10/20 1102 07/10/20 1424 07/11/20 0628  WBC 14.6* 13.7* 9.6  NEUTROABS 11.1*  --   --   HGB 12.4 11.2* 10.2*  HCT 38.0 34.2* 31.7*  MCV 95.5 97.7 97.8  PLT 681* 619* 867*   Basic Metabolic Panel: Recent Labs  Lab 07/10/20 1102 07/10/20 1424 07/10/20 2357 07/11/20 0628  NA 143 139  --  141  K 3.0* 3.1*  --  3.8  CL 107 106  --  110  CO2 22 20*  --  21*  GLUCOSE 96 90  --  67*  BUN 6 6  --  5*  CREATININE 0.81 0.75  --  0.72  CALCIUM 9.0 8.5*  --  8.5*  MG  --   --  1.9  --    GFR: CrCl cannot be calculated (Unknown  ideal weight.). Liver Function Tests: Recent Labs  Lab 07/10/20 1102 07/10/20 1424 07/11/20 0628  AST 13* 13* 11*  ALT $Re'12 10 9  'xje$ ALKPHOS 230* 194* 183*  BILITOT 0.4 0.5 0.5  PROT 6.7 5.9* 5.5*  ALBUMIN 2.9* 2.6* 2.3*   Recent Labs  Lab 07/10/20 1102 07/10/20 1424  LIPASE 151* 128*   No results for input(s): AMMONIA in the last 168 hours. Coagulation Profile: No results for input(s): INR, PROTIME in the last 168 hours. Cardiac Enzymes: No results for input(s): CKTOTAL, CKMB, CKMBINDEX, TROPONINI in the last 168 hours. BNP (last 3 results) No results for input(s): PROBNP in the last 8760 hours. HbA1C: No results for input(s): HGBA1C in the last 72 hours. CBG: No results for input(s): GLUCAP in the last 168 hours. Lipid Profile:  Recent Labs    07/10/20 2357  TRIG 71   Thyroid Function Tests: No results for input(s): TSH, T4TOTAL, FREET4, T3FREE, THYROIDAB in the last 72 hours. Anemia Panel: No results for input(s): VITAMINB12, FOLATE, FERRITIN, TIBC, IRON, RETICCTPCT in the last 72 hours. Sepsis Labs: No results for input(s): PROCALCITON, LATICACIDVEN in the last 168 hours.  Recent Results (from the past 240 hour(s))  Respiratory Panel by RT PCR (Flu A&B, Covid) - Nasopharyngeal Swab     Status: None   Collection Time: 07/11/20  4:18 AM   Specimen: Nasopharyngeal Swab  Result Value Ref Range Status   SARS Coronavirus 2 by RT PCR NEGATIVE NEGATIVE Final    Comment: (NOTE) SARS-CoV-2 target nucleic acids are NOT DETECTED.  The SARS-CoV-2 RNA is generally detectable in upper respiratoy specimens during the acute phase of infection. The lowest concentration of SARS-CoV-2 viral copies this assay can detect is 131 copies/mL. A negative result does not preclude SARS-Cov-2 infection and should not be used as the sole basis for treatment or other patient management decisions. A negative result may occur with  improper specimen collection/handling, submission of specimen  other than nasopharyngeal swab, presence of viral mutation(s) within the areas targeted by this assay, and inadequate number of viral copies (<131 copies/mL). A negative result must be combined with clinical observations, patient history, and epidemiological information. The expected result is Negative.  Fact Sheet for Patients:  https://www.moore.com/  Fact Sheet for Healthcare Providers:  https://www.young.biz/  This test is no t yet approved or cleared by the [place] FDA and  has been authorized for detection and/or diagnosis of SARS-CoV-2 by FDA under an Emergency Use Authorization (EUA). This EUA will remain  in effect (meaning this test can be used) for the duration of the COVID-19 declaration under Section 564(b)(1) of the Act, 21 U.S.C. section 360bbb-3(b)(1), unless the authorization is terminated or revoked sooner.     Influenza A by PCR NEGATIVE NEGATIVE Final   Influenza B by PCR NEGATIVE NEGATIVE Final    Comment: (NOTE) The Xpert Xpress SARS-CoV-2/FLU/RSV assay is intended as an aid in  the diagnosis of influenza from Nasopharyngeal swab specimens and  should not be used as a sole basis for treatment. Nasal washings and  aspirates are unacceptable for Xpert Xpress SARS-CoV-2/FLU/RSV  testing.  Fact Sheet for Patients: https://www.moore.com/  Fact Sheet for Healthcare Providers: https://www.young.biz/  This test is not yet approved or cleared by the [place] FDA and  has been authorized for detection and/or diagnosis of SARS-CoV-2 by  FDA under an Emergency Use Authorization (EUA). This EUA will remain  in effect (meaning this test can be used) for the duration of the  Covid-19 declaration under Section 564(b)(1) of the Act, 21  U.S.C. section 360bbb-3(b)(1), unless the authorization is  terminated or revoked. Performed at [facility], [address]  [place], [place] [address]      Radiology Studies: CT ABDOMEN PELVIS W CONTRAST  Result Date: 07/10/2020 CLINICAL DATA:  Abdominal distension, swelling EXAM: CT ABDOMEN AND PELVIS WITH CONTRAST TECHNIQUE: Multidetector CT imaging of the abdomen and pelvis was performed using the standard protocol following bolus administration of intravenous contrast. CONTRAST:  OMNIPAQUE IOHEXOL 300 MG/ML  SOLN COMPARISON:  05/12/2019 FINDINGS: Lower chest: Airspace opacity peripherally in the right lower lobe/lung base concerning for possible pneumonia. Left lung base clear. No effusions. Hepatobiliary: No focal hepatic abnormality.  Prior cholecystectomy. Pancreas: There is stranding noted around the pancreatic body and tail. Surrounding  small fluid collections within the lesser sac and adjacent to the greater curvature of the stomach, the largest measuring 3.8 cm. Findings most compatible with acute pancreatitis. Spleen: No focal abnormality.  Normal size. Adrenals/Urinary Tract: No adrenal abnormality. No focal renal abnormality. No stones or hydronephrosis. Urinary bladder is unremarkable. Stomach/Bowel: There is wall thickening along the greater curvature adjacent to the inflammation from the pancreas and lesser sac, likely secondarily inflamed. Vascular/Lymphatic: No evidence of aneurysm or adenopathy. Reproductive: Prior hysterectomy.  No adnexal masses. Other: Small to moderate free fluid in the cul-de-sac.  No free air. Musculoskeletal: No acute bony abnormality. IMPRESSION: Stranding noted around the pancreatic body and tail compatible with acute pancreatitis. Adjacent fluid collections, the largest 3.8 cm most compatible with acute fluid collections and developing pseudocysts. Secondary reactive inflammation of the adjacent gastric wall. Small to moderate free fluid in the pelvis. Electronically Signed   By: Rolm Baptise M.D.   On: 07/10/2020 21:02   DG Abd Acute W/Chest  Result Date: 07/10/2020 CLINICAL  DATA:  Worsening left upper quadrant pain. EXAM: DG ABDOMEN ACUTE WITH 1 VIEW CHEST COMPARISON:  10/11/2014 FINDINGS: There is no evidence of dilated bowel loops or free intraperitoneal air. Surgical clips in the gallbladder fossa. No radiopaque calculi or other significant radiographic abnormality is seen. Heart size and mediastinal contours are within normal limits. Both lungs are clear. IMPRESSION: Negative abdominal radiographs.  No acute cardiopulmonary disease. Electronically Signed   By: Franchot Gallo M.D.   On: 07/10/2020 11:36     LOS: 1 day   Antonieta Pert, MD [facility]  07/11/2020, 11:55 AM

## 2020-07-11 NOTE — ED Notes (Signed)
Pt admitted to 6N12; report called to Sam, RN.
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
## 2020-07-12 DIAGNOSIS — K859 Acute pancreatitis without necrosis or infection, unspecified: Secondary | ICD-10-CM | POA: Diagnosis not present

## 2020-07-12 LAB — COMPREHENSIVE METABOLIC PANEL
ALT: 9 U/L (ref 0–44)
AST: 12 U/L — ABNORMAL LOW (ref 15–41)
Albumin: 2.3 g/dL — ABNORMAL LOW (ref 3.5–5.0)
Alkaline Phosphatase: 161 U/L — ABNORMAL HIGH (ref 38–126)
Anion gap: 13 (ref 5–15)
BUN: <5 mg/dL — ABNORMAL LOW (ref 6–20)
CO2: 18 mmol/L — ABNORMAL LOW (ref 22–32)
Calcium: 8.3 mg/dL — ABNORMAL LOW (ref 8.9–10.3)
Chloride: 107 mmol/L (ref 98–111)
Creatinine, Ser: 0.85 mg/dL (ref 0.44–1.00)
GFR, Estimated: >60 mL/min (ref >60)
Glucose, Bld: 77 mg/dL (ref 70–99)
Potassium: 3.7 mmol/L (ref 3.5–5.1)
Sodium: 138 mmol/L (ref 135–145)
Total Bilirubin: 0.9 mg/dL (ref 0.3–1.2)
Total Protein: 5.1 g/dL — ABNORMAL LOW (ref 6.5–8.1)

## 2020-07-12 LAB — CBC
HCT: 34 % — ABNORMAL LOW (ref 36.0–46.0)
Hemoglobin: 11.1 g/dL — ABNORMAL LOW (ref 12.0–15.0)
MCH: 32.2 pg (ref 26.0–34.0)
MCHC: 32.6 g/dL (ref 30.0–36.0)
MCV: 98.6 fL (ref 80.0–100.0)
Platelets: 531 10*3/uL — ABNORMAL HIGH (ref 150–400)
RBC: 3.45 MIL/uL — ABNORMAL LOW (ref 3.87–5.11)
RDW: 12.6 % (ref 11.5–15.5)
WBC: 12 10*3/uL — ABNORMAL HIGH (ref 4.0–10.5)
nRBC: 0 % (ref 0.0–0.2)

## 2020-07-12 MED ORDER — KETOROLAC TROMETHAMINE 15 MG/ML IJ SOLN
15.0000 mg | Freq: Four times a day (QID) | INTRAMUSCULAR | Status: AC | PRN
  Administered 2020-07-12 – 2020-07-17 (×12): 15 mg via INTRAVENOUS
  Filled 2020-07-12 (×12): qty 1

## 2020-07-12 NOTE — Progress Notes (Signed)
Subjective: Pain slowly improving.  Objective: Vital signs in last 24 hours: Temp:  [98.1 F (36.7 C)-100 F (37.8 C)] 98.4 F (36.9 C) (10/22 0424) Pulse Rate:  [68-88] 68 (10/22 0753) Resp:  [16-18] 16 (10/22 0753) BP: (123-141)/(68-76) 141/76 (10/22 0424) SpO2:  [98 %-100 %] 98 % (10/22 0753) Weight change:  Last BM Date: 07/11/20  PE: GEN:  NAD ABD:  Mild distention, mild epigastric tenderness without peritonitis  Lab Results:  Studies/Results: MRCP:  Pancreatitis.  No CBD stone.  Cholecystectomy   Assessment:  1.  Pancreatitis, acute.  Possibly passed CBD stone.  Does drink alcohol but don't think that is etiology.  Plan:  1.  Clear liquid diet. 2.  IVF, analgesics, ambulate as tolerated. 3.  [facility] will follow.   Freddy Jaksch 07/12/2020, 1:09 PM   Cell [phone] If no answer or after 5 PM call [phone]

## 2020-07-12 NOTE — Progress Notes (Signed)
Initial Nutrition Assessment  DOCUMENTATION CODES:   Not applicable  INTERVENTION:   -Obtain current wt -RD will follow for diet advancement and add supplements as appropriate  NUTRITION DIAGNOSIS:   Inadequate oral intake related to altered GI function as evidenced by NPO status.  GOAL:   Patient will meet greater than or equal to 90% of their needs  MONITOR:   Diet advancement, Labs, Weight trends, Skin, I & O's  REASON FOR ASSESSMENT:   Malnutrition Screening Tool    ASSESSMENT:   Tricia Brown is a 44 y.o. female with medical history significant of endometriosis status post diagnostic laparoscopy and hysterectomy, IBS-D, GERD, status post cholecystectomy who presents with abdominal pain.  Pt admitted with pancreatitis.   Reviewed I/O's: +1 L x 24 hours and +2.8 L since admission  Per MD notes CT revealed pancreatitis and fluid collections consistent with developing pseudocysts.   Per GI notes, plan for MRI/MRCP.   Case discussed with RN, who confirms NPO status. Pt was eating ice chips earlier, but stopped due to abdominal pain.   Spoke with pt, who was sitting in recliner chair at time of visit. She reports she has experienced a decreased appetite over the past 2-3 weeks related to abdominal pain. She has been eating one a day and frequently consumed foods are chicken noodle soup, saltines, broth, and mashed potatoes. At baseline, pt reports that she follows a soft diet due to history of IBS- her diet is limited but she has been able to find things that don't aggravate her symptoms (typically eats 2-3 meals per day consisting of baked protein and starch).   Pt reports her UBW is around 160#, but is has been a long time she was last weighed that. She is unsure how much she weighs now and when weight loss started, but states she feels more fatigued and her clothing feels looser.   Per pt, awaiting on GI evaluation to determine need for MRCP. She is understanding of  NPO status.   Labs reviewed.    NUTRITION - FOCUSED PHYSICAL EXAM:    Most Recent Value  Orbital Region No depletion  Upper Arm Region No depletion  Thoracic and Lumbar Region No depletion  Buccal Region No depletion  Temple Region No depletion  Clavicle Bone Region No depletion  Clavicle and Acromion Bone Region No depletion  Scapular Bone Region No depletion  Dorsal Hand No depletion  Patellar Region No depletion  Anterior Thigh Region No depletion  Posterior Calf Region No depletion  Edema (RD Assessment) None  Hair Reviewed  Eyes Reviewed  Mouth Reviewed  Skin Reviewed  Nails Reviewed       Diet Order:   Diet Order            Diet NPO time specified Except for: Sips with Meds, Ice Chips  Diet effective now                 EDUCATION NEEDS:   Education needs have been addressed  Skin:  Skin Assessment: Reviewed RN Assessment  Last BM:  07/11/20  Height:   Ht Readings from Last 1 Encounters:  02/10/20 5\' 7"  (1.702 m)    Weight:   Wt Readings from Last 1 Encounters:  02/10/20 63.5 kg    Ideal Body Weight:  61.4 kg  BMI:  There is no height or weight on file to calculate BMI.  Estimated Nutritional Needs:   Kcal:  1900-2100  Protein:  95-110 grams  Fluid:  >  1.9 L    [name], RD, LDN, CDCES Registered Dietitian II Certified Diabetes Care and Education Specialist Please refer to [facility] for RD and/or RD on-call/weekend/after hours pager

## 2020-07-12 NOTE — Progress Notes (Signed)
PROGRESS NOTE    Tricia Brown  ZMH:[id number] DOB: 17-Apr-1976 DOA: 07/10/2020 PCP: [name], MD   Chief Complaint  Patient presents with  . Abdominal Pain  . Chest Pain   Brief Narrative: As per admitting: 44 y.o. female with medical history significant of endometriosis status post diagnostic laparoscopy and hysterectomy, IBS-D, GERD, status post cholecystectomy presents with abdominal pain x4 days, epigastric and left upper quadrant with associated nausea, vomiting and diarrhea, pain rated 4 out of 10 and when flares of up to 8 out of 10 with certain movement and following meals, not improved by Tums and daily PPI at home.  Drinks alcohol about sixpack on occasion on weekends. In the ED vitals stable labs with hypokalemia 3.0 alk phos 230, leukocytosis 14 K lipase 151 troponin x2 - UA normal CT abdomen showed pancreatitis with fluid collection consistent with pseudocyst reported as "stranding noted around the pancreatic body and tail compatible with acute pancreatitis. Adjacent fluid collections, the largest 3.8 cm most compatible with acute fluid collections and developing pseudocysts. Secondary reactive inflammation of the adjacent gastric wall. Small to moderate free fluid in the pelvis"  Patient was given IV fluids pain medication Zofran and admission was requested. GI was consulted.  Subjective: Patient is feeling of ongoing abdominal pain, while on 0.5 mg Dilaudid every 3 hours as needed  No Nausea and vomiting.  Did not have a restful night.  Assessment & Plan:  Acute pancreatitis with developing pseudocyst largest 3.8 cm: History of cholecystectomy, does drink alcohol which could be the etiology.  Normal TG level. UA unremarkable blood cultures were sent, currently on IV Dilaudid 0.5 mg every 3 hours still having pain, GI input appreciated MRI MRCP no significant change with acute interstitial edematous pancreatitis, no hemorrhage or necrosis, acute peripancreatic fluid  collection no biliary dilatation.We will optimize her pain management add toradol iv, continue Zofran, IV fluid hydration aggressively, LFTs and calcium stable.  Mild metabolic acidosis/dehydration due to #1.  Continue on aggressive hydration as above  Chronic back pain, continue pain control.  Depression: Continue home meds.  Hypokalemia: Resolved.  Leukocytosis likely in the setting of #1, monitor.  She is afebrile.    Nutrition: Diet Order            Diet NPO time specified Except for: Sips with Meds, Ice Chips  Diet effective now                 There is no height or weight on file to calculate BMI. DVT prophylaxis: enoxaparin (LOVENOX) injection 40 mg Start: 07/11/20 1000 Code Status:   Code Status: Full Code  Family Communication: plan of care discssed with patient at bedside.  Status is: Inpatient Remains inpatient appropriate because:IV treatments appropriate due to intensity of illness or inability to take PO, Inpatient level of care appropriate due to severity of illness and Ongoing management of acute pancreatitis with pseudocyst  Dispo: The patient is from: Home              Anticipated d/c is to: Home              Anticipated d/c date is: 3 days              Patient currently is not medically stable to d/c.  Consultants:see note  Procedures:see note  MR I/MRCP abdomen 10/21 1. No significant change in findings consistent with acute interstitial edematous pancreatitis. No evidence of pancreatic hemorrhage or necrosis. 2. Stable peripherally enhancing fluid collections,  primarily in the lesser sac, consistent with acute peripancreatic fluid collections. 3. No biliary dilatation post cholecystectomy. No evidence of choledocholithiasis. 4. Stable focal airspace disease peripherally at the right lung base, likely pneumonia or atelectasis  Culture/Microbiology    Component Value Date/Time   SDES URINE, CLEAN CATCH 05/12/2019 1507   SPECREQUEST  05/12/2019  1507    NONE Performed at [facility], [place] [address], [place],  [address]    CULT  05/12/2019 1507    Multiple bacterial morphotypes present, none predominant. Suggest appropriate recollection if clinically indicated.   REPTSTATUS 05/13/2019 FINAL 05/12/2019 1507    Other culture-see note  Medications: Scheduled Meds: . enoxaparin (LOVENOX) injection  40 mg Subcutaneous Q24H  . estradiol  1 mg Oral Daily  . influenza vac split quadrivalent PF  0.5 mL Intramuscular Tomorrow-1000  . mometasone-formoterol  2 puff Inhalation BID  . pantoprazole  40 mg Oral Daily  . sodium chloride flush  3 mL Intravenous Q12H   Continuous Infusions: . lactated ringers 150 mL/hr at 07/11/20 1643    Antimicrobials: Anti-infectives (From admission, onward)   None     Objective: Vitals: Today's Vitals   07/11/20 2217 07/12/20 0424 07/12/20 0700 07/12/20 0753  BP:  (!) 141/76    Pulse:  82  68  Resp:  17  16  Temp:  98.4 F (36.9 C)    TempSrc:  Oral    SpO2:  100%  98%  PainSc: $Remove'2   9  4     '[id number]$ Intake/Output Summary (Last 24 hours) at 07/12/2020 0850 Last data filed at 07/12/2020 0300 Gross per 24 hour  Intake 1000 ml  Output --  Net 1000 ml   There were no vitals filed for this visit. Weight change:   Intake/Output from previous day: 10/21 0701 - 10/22 0700 In: 1000 [I.V.:1000] Out: -  Intake/Output this shift: No intake/output data recorded.  Examination: General exam: AAO , NAD, weak appearing. HEENT:Oral mucosa moist, Ear/Nose WNL grossly, dentition normal. Respiratory system: bilaterally clear*no wheezing or crackles,no use of accessory muscle Cardiovascular system: S1 & S2 +, No JVD,. Gastrointestinal system: Abdomen soft,Tender on mid-upper abdomen,ND, BS+ Nervous System:Alert, awake, moving extremities and grossly nonfocal Extremities: No edema, distal peripheral pulses palpable.  Skin: No rashes,no icterus. MSK: Normal muscle bulk,tone, power  Data  Reviewed: I have personally reviewed following labs and imaging studies CBC: Recent Labs  Lab 07/10/20 1102 07/10/20 1424 07/11/20 0628 07/12/20 0128  WBC 14.6* 13.7* 9.6 12.0*  NEUTROABS 11.1*  --   --   --   HGB 12.4 11.2* 10.2* 11.1*  HCT 38.0 34.2* 31.7* 34.0*  MCV 95.5 97.7 97.8 98.6  PLT 681* 619* 512* 188*   Basic Metabolic Panel: Recent Labs  Lab 07/10/20 1102 07/10/20 1424 07/10/20 2357 07/11/20 0628 07/12/20 0128  NA 143 139  --  141 138  K 3.0* 3.1*  --  3.8 3.7  CL 107 106  --  110 107  CO2 22 20*  --  21* 18*  GLUCOSE 96 90  --  67* 77  BUN 6 6  --  5* <5*  CREATININE 0.81 0.75  --  0.72 0.85  CALCIUM 9.0 8.5*  --  8.5* 8.3*  MG  --   --  1.9  --   --    GFR: CrCl cannot be calculated (Unknown ideal weight.). Liver Function Tests: Recent Labs  Lab 07/10/20 1102 07/10/20 1424 07/11/20 0628 07/12/20 0128  AST 13* 13*  11* 12*  ALT $Re'12 10 9 9  'qPq$ ALKPHOS 230* 194* 183* 161*  BILITOT 0.4 0.5 0.5 0.9  PROT 6.7 5.9* 5.5* 5.1*  ALBUMIN 2.9* 2.6* 2.3* 2.3*   Recent Labs  Lab 07/10/20 1102 07/10/20 1424  LIPASE 151* 128*   No results for input(s): AMMONIA in the last 168 hours. Coagulation Profile: No results for input(s): INR, PROTIME in the last 168 hours. Cardiac Enzymes: No results for input(s): CKTOTAL, CKMB, CKMBINDEX, TROPONINI in the last 168 hours. BNP (last 3 results) No results for input(s): PROBNP in the last 8760 hours. HbA1C: No results for input(s): HGBA1C in the last 72 hours. CBG: No results for input(s): GLUCAP in the last 168 hours. Lipid Profile: Recent Labs    07/10/20 2357  TRIG 71   Thyroid Function Tests: No results for input(s): TSH, T4TOTAL, FREET4, T3FREE, THYROIDAB in the last 72 hours. Anemia Panel: No results for input(s): VITAMINB12, FOLATE, FERRITIN, TIBC, IRON, RETICCTPCT in the last 72 hours. Sepsis Labs: No results for input(s): PROCALCITON, LATICACIDVEN in the last 168 hours.  Recent Results (from the  past 240 hour(s))  Respiratory Panel by RT PCR (Flu A&B, Covid) - Nasopharyngeal Swab     Status: None   Collection Time: 07/11/20  4:18 AM   Specimen: Nasopharyngeal Swab  Result Value Ref Range Status   SARS Coronavirus 2 by RT PCR NEGATIVE NEGATIVE Final    Comment: (NOTE) SARS-CoV-2 target nucleic acids are NOT DETECTED.  The SARS-CoV-2 RNA is generally detectable in upper respiratoy specimens during the acute phase of infection. The lowest concentration of SARS-CoV-2 viral copies this assay can detect is 131 copies/mL. A negative result does not preclude SARS-Cov-2 infection and should not be used as the sole basis for treatment or other patient management decisions. A negative result may occur with  improper specimen collection/handling, submission of specimen other than nasopharyngeal swab, presence of viral mutation(s) within the areas targeted by this assay, and inadequate number of viral copies (<131 copies/mL). A negative result must be combined with clinical observations, patient history, and epidemiological information. The expected result is Negative.  Fact Sheet for Patients:  PinkCheek.be  Fact Sheet for Healthcare Providers:  GravelBags.it  This test is no t yet approved or cleared by the [place] FDA and  has been authorized for detection and/or diagnosis of SARS-CoV-2 by FDA under an Emergency Use Authorization (EUA). This EUA will remain  in effect (meaning this test can be used) for the duration of the COVID-19 declaration under Section 564(b)(1) of the Act, 21 U.S.C. section 360bbb-3(b)(1), unless the authorization is terminated or revoked sooner.     Influenza A by PCR NEGATIVE NEGATIVE Final   Influenza B by PCR NEGATIVE NEGATIVE Final    Comment: (NOTE) The Xpert Xpress SARS-CoV-2/FLU/RSV assay is intended as an aid in  the diagnosis of influenza from Nasopharyngeal swab specimens and    should not be used as a sole basis for treatment. Nasal washings and  aspirates are unacceptable for Xpert Xpress SARS-CoV-2/FLU/RSV  testing.  Fact Sheet for Patients: PinkCheek.be  Fact Sheet for Healthcare Providers: GravelBags.it  This test is not yet approved or cleared by the [place] FDA and  has been authorized for detection and/or diagnosis of SARS-CoV-2 by  FDA under an Emergency Use Authorization (EUA). This EUA will remain  in effect (meaning this test can be used) for the duration of the  Covid-19 declaration under Section 564(b)(1) of the Act, 21  U.S.C. section 360bbb-3(b)(1),  unless the authorization is  terminated or revoked. Performed at [facility], [place] [address] [place], [place] [address]      Radiology Studies: CT ABDOMEN PELVIS W CONTRAST  Result Date: 07/10/2020 CLINICAL DATA:  Abdominal distension, swelling EXAM: CT ABDOMEN AND PELVIS WITH CONTRAST TECHNIQUE: Multidetector CT imaging of the abdomen and pelvis was performed using the standard protocol following bolus administration of intravenous contrast. CONTRAST:  110mL OMNIPAQUE IOHEXOL 300 MG/ML  SOLN COMPARISON:  05/12/2019 FINDINGS: Lower chest: Airspace opacity peripherally in the right lower lobe/lung base concerning for possible pneumonia. Left lung base clear. No effusions. Hepatobiliary: No focal hepatic abnormality.  Prior cholecystectomy. Pancreas: There is stranding noted around the pancreatic body and tail. Surrounding small fluid collections within the lesser sac and adjacent to the greater curvature of the stomach, the largest measuring 3.8 cm. Findings most compatible with acute pancreatitis. Spleen: No focal abnormality.  Normal size. Adrenals/Urinary Tract: No adrenal abnormality. No focal renal abnormality. No stones or hydronephrosis. Urinary bladder is unremarkable. Stomach/Bowel: There is wall thickening along the greater  curvature adjacent to the inflammation from the pancreas and lesser sac, likely secondarily inflamed. Vascular/Lymphatic: No evidence of aneurysm or adenopathy. Reproductive: Prior hysterectomy.  No adnexal masses. Other: Small to moderate free fluid in the cul-de-sac.  No free air. Musculoskeletal: No acute bony abnormality. IMPRESSION: Stranding noted around the pancreatic body and tail compatible with acute pancreatitis. Adjacent fluid collections, the largest 3.8 cm most compatible with acute fluid collections and developing pseudocysts. Secondary reactive inflammation of the adjacent gastric wall. Small to moderate free fluid in the pelvis. Electronically Signed   By: Rolm Baptise M.D.   On: 07/10/2020 21:02   DG Abd Acute W/Chest  Result Date: 07/10/2020 CLINICAL DATA:  Worsening left upper quadrant pain. EXAM: DG ABDOMEN ACUTE WITH 1 VIEW CHEST COMPARISON:  10/11/2014 FINDINGS: There is no evidence of dilated bowel loops or free intraperitoneal air. Surgical clips in the gallbladder fossa. No radiopaque calculi or other significant radiographic abnormality is seen. Heart size and mediastinal contours are within normal limits. Both lungs are clear. IMPRESSION: Negative abdominal radiographs.  No acute cardiopulmonary disease. Electronically Signed   By: Franchot Gallo M.D.   On: 07/10/2020 11:36   MR ABDOMEN MRCP W WO CONTAST  Result Date: 07/12/2020 CLINICAL DATA:  Severe acute pancreatitis. EXAM: MRI ABDOMEN WITHOUT AND WITH CONTRAST (INCLUDING MRCP) TECHNIQUE: Multiplanar multisequence MR imaging of the abdomen was performed both before and after the administration of intravenous contrast. Heavily T2-weighted images of the biliary and pancreatic ducts were obtained, and three-dimensional MRCP images were rendered by post processing. CONTRAST:  85mL GADAVIST GADOBUTROL 1 MMOL/ML IV SOLN COMPARISON:  Abdominopelvic CT 07/10/2020, 05/12/2019 and 10/11/2014. FINDINGS: Lower chest: Focal airspace  disease peripherally at the right lung base is unchanged from recent CT and may reflect pneumonia or atelectasis. No significant pleural or pericardial effusion. Hepatobiliary: The liver is normal in signal and demonstrates no focal abnormality or abnormal enhancement. There is no significant biliary dilatation post cholecystectomy. No evidence of choledocholithiasis. Pancreas: As demonstrated on recent CT, the pancreas is edematous consistent with acute interstitial edematous pancreatitis. No evidence of pancreatic hemorrhage or necrosis. The pancreas enhances homogeneously following contrast. The pancreatic duct is not dilated. There are diffuse surrounding inflammatory changes with fluid collections in the lesser sac, measuring up to 3.3 x 1.7 cm on image 15/5. There is an additional component draping around the splenic vessels, measuring up to 3.4 x 1.8 cm. Less  well-defined fluid extends anteriorly along the posterior aspect of the stomach. These fluid collections demonstrate some peripheral enhancement following contrast and have not significantly changed compared with the recent CT. Spleen: Normal in size without focal abnormality. Adrenals/Urinary Tract: Both adrenal glands appear normal. Both kidneys appear normal. No evidence of hydronephrosis or abnormal enhancement. Stomach/Bowel: Posterior gastric wall thickening similar to prior CT, attributed to acute pancreatitis. No significant bowel distension, wall thickening or surrounding inflammatory change. Vascular/Lymphatic: There are no enlarged abdominal lymph nodes. No significant vascular findings. The portal, superior mesenteric and splenic veins are patent. Other: As above, peripancreatic inflammatory changes tracking asymmetrically into the left mesentery. Small amount of ascites. Musculoskeletal: No acute or significant osseous findings. IMPRESSION: 1. No significant change in findings consistent with acute interstitial edematous pancreatitis. No  evidence of pancreatic hemorrhage or necrosis. 2. Stable peripherally enhancing fluid collections, primarily in the lesser sac, consistent with acute peripancreatic fluid collections. 3. No biliary dilatation post cholecystectomy. No evidence of choledocholithiasis. 4. Stable focal airspace disease peripherally at the right lung base, likely pneumonia or atelectasis. Electronically Signed   By: Richardean Sale M.D.   On: 07/12/2020 08:00     LOS: 2 days   Antonieta Pert, MD [facility]  07/12/2020, 8:50 AM

## 2020-07-13 DIAGNOSIS — K859 Acute pancreatitis without necrosis or infection, unspecified: Secondary | ICD-10-CM | POA: Diagnosis not present

## 2020-07-13 LAB — COMPREHENSIVE METABOLIC PANEL
ALT: 6 U/L (ref 0–44)
AST: 8 U/L — ABNORMAL LOW (ref 15–41)
Albumin: 2 g/dL — ABNORMAL LOW (ref 3.5–5.0)
Alkaline Phosphatase: 132 U/L — ABNORMAL HIGH (ref 38–126)
Anion gap: 11 (ref 5–15)
BUN: <5 mg/dL — ABNORMAL LOW (ref 6–20)
CO2: 22 mmol/L (ref 22–32)
Calcium: 8 mg/dL — ABNORMAL LOW (ref 8.9–10.3)
Chloride: 106 mmol/L (ref 98–111)
Creatinine, Ser: 0.67 mg/dL (ref 0.44–1.00)
GFR, Estimated: >60 mL/min (ref >60)
Glucose, Bld: 84 mg/dL (ref 70–99)
Potassium: 2.9 mmol/L — ABNORMAL LOW (ref 3.5–5.1)
Sodium: 139 mmol/L (ref 135–145)
Total Bilirubin: 0.5 mg/dL (ref 0.3–1.2)
Total Protein: 5 g/dL — ABNORMAL LOW (ref 6.5–8.1)

## 2020-07-13 LAB — CBC
HCT: 31.9 % — ABNORMAL LOW (ref 36.0–46.0)
Hemoglobin: 10.5 g/dL — ABNORMAL LOW (ref 12.0–15.0)
MCH: 31.8 pg (ref 26.0–34.0)
MCHC: 32.9 g/dL (ref 30.0–36.0)
MCV: 96.7 fL (ref 80.0–100.0)
Platelets: 467 10*3/uL — ABNORMAL HIGH (ref 150–400)
RBC: 3.3 MIL/uL — ABNORMAL LOW (ref 3.87–5.11)
RDW: 12.7 % (ref 11.5–15.5)
WBC: 10.3 10*3/uL (ref 4.0–10.5)
nRBC: 0 % (ref 0.0–0.2)

## 2020-07-13 LAB — LIPASE, BLOOD: Lipase: 101 U/L — ABNORMAL HIGH (ref 11–51)

## 2020-07-13 MED ORDER — LOPERAMIDE HCL 2 MG PO CAPS
2.0000 mg | ORAL_CAPSULE | ORAL | Status: DC | PRN
  Administered 2020-07-13 – 2020-07-17 (×8): 2 mg via ORAL
  Filled 2020-07-13 (×7): qty 1

## 2020-07-13 MED ORDER — POTASSIUM CHLORIDE CRYS ER 20 MEQ PO TBCR
20.0000 meq | EXTENDED_RELEASE_TABLET | Freq: Every day | ORAL | Status: DC
  Administered 2020-07-13 – 2020-07-18 (×6): 20 meq via ORAL
  Filled 2020-07-13 (×6): qty 1

## 2020-07-13 MED ORDER — OXYCODONE-ACETAMINOPHEN 5-325 MG PO TABS
1.0000 | ORAL_TABLET | ORAL | Status: DC | PRN
  Administered 2020-07-13 – 2020-07-16 (×13): 1 via ORAL
  Filled 2020-07-13 (×13): qty 1

## 2020-07-13 MED ORDER — POTASSIUM CHLORIDE CRYS ER 20 MEQ PO TBCR
40.0000 meq | EXTENDED_RELEASE_TABLET | ORAL | Status: AC
  Administered 2020-07-13 (×2): 40 meq via ORAL
  Filled 2020-07-13 (×2): qty 2

## 2020-07-13 NOTE — Progress Notes (Signed)
PROGRESS NOTE    Tricia Brown  UGQ:[id number] DOB: Apr 05, 1976 DOA: 07/10/2020 PCP: Lorelee Market, MD   Chief Complaint  Patient presents with  . Abdominal Pain  . Chest Pain   Brief Narrative: As per admitting: 44 y.o. female with medical history significant of endometriosis status post diagnostic laparoscopy and hysterectomy, IBS-D, GERD, status post cholecystectomy presents with abdominal pain x4 days, epigastric and left upper quadrant with associated nausea, vomiting and diarrhea, pain rated 4 out of 10 and when flares of up to 8 out of 10 with certain movement and following meals, not improved by Tums and daily PPI at home.  Drinks alcohol about sixpack on occasion on weekends. In the ED vitals stable labs with hypokalemia 3.0 alk phos 230, leukocytosis 14 K lipase 151 troponin x2 - UA normal CT abdomen showed pancreatitis with fluid collection consistent with pseudocyst reported as "stranding noted around the pancreatic body and tail compatible with acute pancreatitis. Adjacent fluid collections, the largest 3.8 cm most compatible with acute fluid collections and developing pseudocysts. Secondary reactive inflammation of the adjacent gastric wall. Small to moderate free fluid in the pelvis"  Patient was given IV fluids pain medication Zofran and admission was requested. GI was consulted.  Subjective: Resting comfortably on the bedside recliner.  Pain is improving.  Assessment & Plan:  Acute pancreatitis possibly passed CBD stone but does drink alcohol but felt to be less likely the cause per GI.  MRCP negative for choledocholithiasis.  Overall clinically improving continue IV hydration, add oral oxycodone on pain regimen and slowly wean off Dilaudid.  Advancing to full liquid diet and advance as tolerated.  Appreciate GI input on board.    Hypokalemia again at 2.9 with aggressively replete.  Hypoalbuminemia in the setting of acute pancreatitis augment diet as  tolerated.  Mild metabolic acidosis/dehydration due to #1.  Encourage hydration Chronic back pain, controlled. Depression: Mood is stable. Leukocytosis likely in the setting of #1 resolved.  Nutrition: Diet Order            Diet full liquid Room service appropriate? Yes; Fluid consistency: Thin  Diet effective now                 There is no height or weight on file to calculate BMI. DVT prophylaxis: enoxaparin (LOVENOX) injection 40 mg Start: 07/11/20 1000 Code Status:   Code Status: Full Code  Family Communication: plan of care discssed with patient at bedside.  Status is: Inpatient Remains inpatient appropriate because:IV treatments appropriate due to intensity of illness or inability to take PO, Inpatient level of care appropriate due to severity of illness and Ongoing management of acute pancreatitis with pseudocyst  Dispo: The patient is from: Home              Anticipated d/c is to: Home              Anticipated d/c date is: 3 days              Patient currently is not medically stable to d/c.  Consultants:see note  Procedures:see note  MR I/MRCP abdomen 10/21 1. No significant change in findings consistent with acute interstitial edematous pancreatitis. No evidence of pancreatic hemorrhage or necrosis. 2. Stable peripherally enhancing fluid collections, primarily in the lesser sac, consistent with acute peripancreatic fluid collections. 3. No biliary dilatation post cholecystectomy. No evidence of choledocholithiasis. 4. Stable focal airspace disease peripherally at the right lung base, likely pneumonia or atelectasis  Culture/Microbiology  Component Value Date/Time   SDES URINE, CLEAN CATCH 05/12/2019 1507   SPECREQUEST  05/12/2019 1507    NONE Performed at [facility], [place] [address], [place], [place] [address]    CULT  05/12/2019 1507    Multiple bacterial morphotypes present, none predominant. Suggest appropriate recollection if clinically  indicated.   REPTSTATUS 05/13/2019 FINAL 05/12/2019 1507    Other culture-see note  Medications: Scheduled Meds: . enoxaparin (LOVENOX) injection  40 mg Subcutaneous Q24H  . estradiol  1 mg Oral Daily  . mometasone-formoterol  2 puff Inhalation BID  . pantoprazole  40 mg Oral Daily  . sodium chloride flush  3 mL Intravenous Q12H   Continuous Infusions: . lactated ringers 150 mL/hr at 07/13/20 0559    Antimicrobials: Anti-infectives (From admission, onward)   None     Objective: Vitals: Today's Vitals   07/13/20 0638 07/13/20 0851 07/13/20 1010 07/13/20 1313  BP:    132/77  Pulse:    88  Resp:    16  Temp:    98.4 F (36.9 C)  TempSrc:    Oral  SpO2:    100%  PainSc: $Remove'6  7  9      'qSilpum$ Intake/Output Summary (Last 24 hours) at 07/13/2020 1319 Last data filed at 07/13/2020 1314 Gross per 24 hour  Intake 300 ml  Output --  Net 300 ml   There were no vitals filed for this visit. Weight change:   Intake/Output from previous day: 10/22 0701 - 10/23 0700 In: 120 [P.O.:120] Out: -  Intake/Output this shift: Total I/O In: 300 [P.O.:300] Out: -   Examination: General exam: AAOx3, NAD, weak appearing. HEENT:Oral mucosa moist, Ear/Nose WNL grossly, dentition normal. Respiratory system: bilaterally clear,no wheezing or crackles,no use of accessory muscle Cardiovascular system: S1 & S2 +, No JVD,. Gastrointestinal system: Abdomen soft, mild diffuse tenderness,ND, BS+ Nervous System:Alert, awake, moving extremities and grossly nonfocal Extremities: No edema, distal peripheral pulses palpable.  Skin: No rashes,no icterus. MSK: Normal muscle bulk,tone, power  r  Data Reviewed: I have personally reviewed following labs and imaging studies CBC: Recent Labs  Lab 07/10/20 1102 07/10/20 1424 07/11/20 0628 07/12/20 0128 07/13/20 0313  WBC 14.6* 13.7* 9.6 12.0* 10.3  NEUTROABS 11.1*  --   --   --   --   HGB 12.4 11.2* 10.2* 11.1* 10.5*  HCT 38.0 34.2* 31.7* 34.0* 31.9*   MCV 95.5 97.7 97.8 98.6 96.7  PLT 681* 619* 512* 531* 026*   Basic Metabolic Panel: Recent Labs  Lab 07/10/20 1102 07/10/20 1424 07/10/20 2357 07/11/20 0628 07/12/20 0128 07/13/20 0313  NA 143 139  --  141 138 139  K 3.0* 3.1*  --  3.8 3.7 2.9*  CL 107 106  --  110 107 106  CO2 22 20*  --  21* 18* 22  GLUCOSE 96 90  --  67* 77 84  BUN 6 6  --  5* <5* <5*  CREATININE 0.81 0.75  --  0.72 0.85 0.67  CALCIUM 9.0 8.5*  --  8.5* 8.3* 8.0*  MG  --   --  1.9  --   --   --    GFR: CrCl cannot be calculated (Unknown ideal weight.). Liver Function Tests: Recent Labs  Lab 07/10/20 1102 07/10/20 1424 07/11/20 0628 07/12/20 0128 07/13/20 0313  AST 13* 13* 11* 12* 8*  ALT $Re'12 10 9 9 6  'FTH$ ALKPHOS 230* 194* 183* 161* 132*  BILITOT 0.4 0.5 0.5 0.9 0.5  PROT 6.7 5.9* 5.5* 5.1* 5.0*  ALBUMIN 2.9* 2.6* 2.3* 2.3* 2.0*   Recent Labs  Lab 07/10/20 1102 07/10/20 1424 07/13/20 0313  LIPASE 151* 128* 101*   No results for input(s): AMMONIA in the last 168 hours. Coagulation Profile: No results for input(s): INR, PROTIME in the last 168 hours. Cardiac Enzymes: No results for input(s): CKTOTAL, CKMB, CKMBINDEX, TROPONINI in the last 168 hours. BNP (last 3 results) No results for input(s): PROBNP in the last 8760 hours. HbA1C: No results for input(s): HGBA1C in the last 72 hours. CBG: No results for input(s): GLUCAP in the last 168 hours. Lipid Profile: Recent Labs    07/10/20 2357  TRIG 71   Thyroid Function Tests: No results for input(s): TSH, T4TOTAL, FREET4, T3FREE, THYROIDAB in the last 72 hours. Anemia Panel: No results for input(s): VITAMINB12, FOLATE, FERRITIN, TIBC, IRON, RETICCTPCT in the last 72 hours. Sepsis Labs: No results for input(s): PROCALCITON, LATICACIDVEN in the last 168 hours.  Recent Results (from the past 240 hour(s))  Respiratory Panel by RT PCR (Flu A&B, Covid) - Nasopharyngeal Swab     Status: None   Collection Time: 07/11/20  4:18 AM   Specimen:  Nasopharyngeal Swab  Result Value Ref Range Status   SARS Coronavirus 2 by RT PCR NEGATIVE NEGATIVE Final    Comment: (NOTE) SARS-CoV-2 target nucleic acids are NOT DETECTED.  The SARS-CoV-2 RNA is generally detectable in upper respiratoy specimens during the acute phase of infection. The lowest concentration of SARS-CoV-2 viral copies this assay can detect is 131 copies/mL. A negative result does not preclude SARS-Cov-2 infection and should not be used as the sole basis for treatment or other patient management decisions. A negative result may occur with  improper specimen collection/handling, submission of specimen other than nasopharyngeal swab, presence of viral mutation(s) within the areas targeted by this assay, and inadequate number of viral copies (<131 copies/mL). A negative result must be combined with clinical observations, patient history, and epidemiological information. The expected result is Negative.  Fact Sheet for Patients:  https://www.moore.com/  Fact Sheet for Healthcare Providers:  https://www.young.biz/  This test is no t yet approved or cleared by the [place] FDA and  has been authorized for detection and/or diagnosis of SARS-CoV-2 by FDA under an Emergency Use Authorization (EUA). This EUA will remain  in effect (meaning this test can be used) for the duration of the COVID-19 declaration under Section 564(b)(1) of the Act, 21 U.S.C. section 360bbb-3(b)(1), unless the authorization is terminated or revoked sooner.     Influenza A by PCR NEGATIVE NEGATIVE Final   Influenza B by PCR NEGATIVE NEGATIVE Final    Comment: (NOTE) The Xpert Xpress SARS-CoV-2/FLU/RSV assay is intended as an aid in  the diagnosis of influenza from Nasopharyngeal swab specimens and  should not be used as a sole basis for treatment. Nasal washings and  aspirates are unacceptable for Xpert Xpress SARS-CoV-2/FLU/RSV  testing.  Fact Sheet  for Patients: https://www.moore.com/  Fact Sheet for Healthcare Providers: https://www.young.biz/  This test is not yet approved or cleared by the [place] FDA and  has been authorized for detection and/or diagnosis of SARS-CoV-2 by  FDA under an Emergency Use Authorization (EUA). This EUA will remain  in effect (meaning this test can be used) for the duration of the  Covid-19 declaration under Section 564(b)(1) of the Act, 21  U.S.C. section 360bbb-3(b)(1), unless the authorization is  terminated or revoked. Performed at [facility], [address] [place], [place] [address]      Radiology Studies: MR ABDOMEN MRCP W WO CONTAST  Result Date: 07/12/2020 CLINICAL DATA:  Severe acute pancreatitis. EXAM: MRI ABDOMEN WITHOUT AND WITH CONTRAST (INCLUDING MRCP) TECHNIQUE: Multiplanar multisequence MR imaging of the abdomen was performed both before and after the administration of intravenous contrast. Heavily T2-weighted images of the biliary and pancreatic ducts were obtained, and three-dimensional MRCP images were rendered by post processing. CONTRAST:  51mL GADAVIST GADOBUTROL 1 MMOL/ML IV SOLN COMPARISON:  Abdominopelvic CT 07/10/2020, 05/12/2019 and 10/11/2014. FINDINGS: Lower chest: Focal airspace disease peripherally at the right lung base is unchanged from recent CT and may reflect pneumonia or atelectasis. No significant pleural or pericardial effusion. Hepatobiliary: The liver is normal in signal and demonstrates no focal abnormality or abnormal enhancement. There is no significant biliary dilatation post cholecystectomy. No evidence of choledocholithiasis. Pancreas: As demonstrated on recent CT, the pancreas is edematous consistent with acute interstitial edematous pancreatitis. No evidence of pancreatic hemorrhage or necrosis. The pancreas enhances homogeneously following contrast. The pancreatic duct is not dilated. There are diffuse  surrounding inflammatory changes with fluid collections in the lesser sac, measuring up to 3.3 x 1.7 cm on image 15/5. There is an additional component draping around the splenic vessels, measuring up to 3.4 x 1.8 cm. Less well-defined fluid extends anteriorly along the posterior aspect of the stomach. These fluid collections demonstrate some peripheral enhancement following contrast and have not significantly changed compared with the recent CT. Spleen: Normal in size without focal abnormality. Adrenals/Urinary Tract: Both adrenal glands appear normal. Both kidneys appear normal. No evidence of hydronephrosis or abnormal enhancement. Stomach/Bowel: Posterior gastric wall thickening similar to prior CT, attributed to acute pancreatitis. No significant bowel distension, wall thickening or surrounding inflammatory change. Vascular/Lymphatic: There are no enlarged abdominal lymph nodes. No significant vascular findings. The portal, superior mesenteric and splenic veins are patent. Other: As above, peripancreatic inflammatory changes tracking asymmetrically into the left mesentery. Small amount of ascites. Musculoskeletal: No acute or significant osseous findings. IMPRESSION: 1. No significant change in findings consistent with acute interstitial edematous pancreatitis. No evidence of pancreatic hemorrhage or necrosis. 2. Stable peripherally enhancing fluid collections, primarily in the lesser sac, consistent with acute peripancreatic fluid collections. 3. No biliary dilatation post cholecystectomy. No evidence of choledocholithiasis. 4. Stable focal airspace disease peripherally at the right lung base, likely pneumonia or atelectasis. Electronically Signed   By: Richardean Sale M.D.   On: 07/12/2020 08:00     LOS: 3 days   Antonieta Pert, MD [facility]  07/13/2020, 1:19 PM

## 2020-07-13 NOTE — Progress Notes (Signed)
Subjective: Abdominal pain improving.  Objective: Vital signs in last 24 hours: Temp:  [97.9 F (36.6 C)-98.5 F (36.9 C)] 97.9 F (36.6 C) (10/23 0326) Pulse Rate:  [83-99] 91 (10/23 0326) Resp:  [16-17] 16 (10/23 0326) BP: (124-146)/(66-94) 133/82 (10/23 0326) SpO2:  [100 %] 100 % (10/23 0326) Weight change:  Last BM Date: 07/11/20  PE: GEN:  NAD ABD:  Soft, mild epigastric tenderness, mild distention, no peritonitis  Lab Results: CBC    Component Value Date/Time   WBC 10.3 07/13/2020 0313   RBC 3.30 (L) 07/13/2020 0313   HGB 10.5 (L) 07/13/2020 0313   HGB 13.0 03/23/2013 1621   HCT 31.9 (L) 07/13/2020 0313   HCT 37.7 03/23/2013 1621   PLT 467 (H) 07/13/2020 0313   PLT 235 03/23/2013 1621   MCV 96.7 07/13/2020 0313   MCV 93 03/23/2013 1621   MCH 31.8 07/13/2020 0313   MCHC 32.9 07/13/2020 0313   RDW 12.7 07/13/2020 0313   RDW 12.1 03/23/2013 1621   LYMPHSABS 2.1 07/10/2020 1102   LYMPHSABS 3.6 06/28/2012 0009   MONOABS 0.6 07/10/2020 1102   MONOABS 0.6 06/28/2012 0009   EOSABS 0.6 (H) 07/10/2020 1102   EOSABS 0.3 06/28/2012 0009   BASOSABS 0.1 07/10/2020 1102   BASOSABS 0.1 06/28/2012 0009   CMP     Component Value Date/Time   NA 139 07/13/2020 0313   NA 139 03/23/2013 1621   K 2.9 (L) 07/13/2020 0313   K 4.0 03/23/2013 1621   CL 106 07/13/2020 0313   CL 105 03/23/2013 1621   CO2 22 07/13/2020 0313   CO2 28 03/23/2013 1621   GLUCOSE 84 07/13/2020 0313   GLUCOSE 95 03/23/2013 1621   BUN <5 (L) 07/13/2020 0313   BUN 9 03/23/2013 1621   CREATININE 0.67 07/13/2020 0313   CREATININE 1.05 03/23/2013 1621   CALCIUM 8.0 (L) 07/13/2020 0313   CALCIUM 8.9 03/23/2013 1621   PROT 5.0 (L) 07/13/2020 0313   PROT 7.2 06/28/2012 0009   ALBUMIN 2.0 (L) 07/13/2020 0313   ALBUMIN 3.9 06/28/2012 0009   AST 8 (L) 07/13/2020 0313   AST 31 06/28/2012 0009   ALT 6 07/13/2020 0313   ALT 34 06/28/2012 0009   ALKPHOS 132 (H) 07/13/2020 0313   ALKPHOS 178 (H)  06/28/2012 0009   BILITOT 0.5 07/13/2020 0313   BILITOT 0.2 06/28/2012 0009   GFRNONAA >60 07/13/2020 0313   GFRNONAA >60 03/23/2013 1621   GFRAA >60 12/18/2019 1504   GFRAA >60 03/23/2013 1621   Assessment:  1.  Pancreatitis, acute.  Possibly passed CBD stone.  Does drink alcohol but don't think that is etiology.  Improving.  MRCP negative choledocholithiasis.  Plan:  1.  Advance diet slowly. 2.  Judicious analgesics, transition to oral. 3.  Ambulate halls, OOBTC as tolerated. 4.  [facility] will follow.   Tricia Brown 07/13/2020, 8:17 AM   Cell [phone] If no answer or after 5 PM call [phone]

## 2020-07-14 DIAGNOSIS — K859 Acute pancreatitis without necrosis or infection, unspecified: Secondary | ICD-10-CM | POA: Diagnosis not present

## 2020-07-14 LAB — CBC
HCT: 29.9 % — ABNORMAL LOW (ref 36.0–46.0)
Hemoglobin: 9.7 g/dL — ABNORMAL LOW (ref 12.0–15.0)
MCH: 31.6 pg (ref 26.0–34.0)
MCHC: 32.4 g/dL (ref 30.0–36.0)
MCV: 97.4 fL (ref 80.0–100.0)
Platelets: 465 10*3/uL — ABNORMAL HIGH (ref 150–400)
RBC: 3.07 MIL/uL — ABNORMAL LOW (ref 3.87–5.11)
RDW: 12.8 % (ref 11.5–15.5)
WBC: 9.7 10*3/uL (ref 4.0–10.5)
nRBC: 0 % (ref 0.0–0.2)

## 2020-07-14 LAB — COMPREHENSIVE METABOLIC PANEL
ALT: 8 U/L (ref 0–44)
AST: 10 U/L — ABNORMAL LOW (ref 15–41)
Albumin: 2 g/dL — ABNORMAL LOW (ref 3.5–5.0)
Alkaline Phosphatase: 119 U/L (ref 38–126)
Anion gap: 8 (ref 5–15)
BUN: <5 mg/dL — ABNORMAL LOW (ref 6–20)
CO2: 24 mmol/L (ref 22–32)
Calcium: 8.2 mg/dL — ABNORMAL LOW (ref 8.9–10.3)
Chloride: 109 mmol/L (ref 98–111)
Creatinine, Ser: 0.6 mg/dL (ref 0.44–1.00)
GFR, Estimated: >60 mL/min (ref >60)
Glucose, Bld: 104 mg/dL — ABNORMAL HIGH (ref 70–99)
Potassium: 3.6 mmol/L (ref 3.5–5.1)
Sodium: 141 mmol/L (ref 135–145)
Total Bilirubin: 0.5 mg/dL (ref 0.3–1.2)
Total Protein: 4.9 g/dL — ABNORMAL LOW (ref 6.5–8.1)

## 2020-07-14 MED ORDER — HYDROMORPHONE HCL 1 MG/ML IJ SOLN
1.0000 mg | INTRAMUSCULAR | Status: DC | PRN
  Administered 2020-07-14 – 2020-08-04 (×125): 1 mg via INTRAVENOUS
  Filled 2020-07-14 (×129): qty 1

## 2020-07-14 MED ORDER — DICYCLOMINE HCL 10 MG PO CAPS
10.0000 mg | ORAL_CAPSULE | Freq: Four times a day (QID) | ORAL | Status: DC | PRN
  Administered 2020-07-14 – 2020-07-17 (×7): 10 mg via ORAL
  Filled 2020-07-14 (×8): qty 1

## 2020-07-14 NOTE — Progress Notes (Signed)
Subjective: Abdominal pain worsening. Is having vomiting.  Objective: Vital signs in last 24 hours: Temp:  [97.7 F (36.5 C)-98.4 F (36.9 C)] 98.4 F (36.9 C) (10/24 0449) Pulse Rate:  [85-98] 98 (10/24 0449) Resp:  [16-17] 17 (10/24 0449) BP: (123-134)/(77-80) 123/77 (10/24 0449) SpO2:  [92 %-100 %] 99 % (10/24 0901) Weight change:  Last BM Date: 07/14/20  PE: GEN:  NAD, somewhat uncomfortable-appearing but non-toxic ABD:  Mild distention, epigastric tenderness with some voluntary guarding, no peritonitis  Lab Results: CBC    Component Value Date/Time   WBC 9.7 07/14/2020 0140   RBC 3.07 (L) 07/14/2020 0140   HGB 9.7 (L) 07/14/2020 0140   HGB 13.0 03/23/2013 1621   HCT 29.9 (L) 07/14/2020 0140   HCT 37.7 03/23/2013 1621   PLT 465 (H) 07/14/2020 0140   PLT 235 03/23/2013 1621   MCV 97.4 07/14/2020 0140   MCV 93 03/23/2013 1621   MCH 31.6 07/14/2020 0140   MCHC 32.4 07/14/2020 0140   RDW 12.8 07/14/2020 0140   RDW 12.1 03/23/2013 1621   LYMPHSABS 2.1 07/10/2020 1102   LYMPHSABS 3.6 06/28/2012 0009   MONOABS 0.6 07/10/2020 1102   MONOABS 0.6 06/28/2012 0009   EOSABS 0.6 (H) 07/10/2020 1102   EOSABS 0.3 06/28/2012 0009   BASOSABS 0.1 07/10/2020 1102   BASOSABS 0.1 06/28/2012 0009   CMP     Component Value Date/Time   NA 141 07/14/2020 0140   NA 139 03/23/2013 1621   K 3.6 07/14/2020 0140   K 4.0 03/23/2013 1621   CL 109 07/14/2020 0140   CL 105 03/23/2013 1621   CO2 24 07/14/2020 0140   CO2 28 03/23/2013 1621   GLUCOSE 104 (H) 07/14/2020 0140   GLUCOSE 95 03/23/2013 1621   BUN <5 (L) 07/14/2020 0140   BUN 9 03/23/2013 1621   CREATININE 0.60 07/14/2020 0140   CREATININE 1.05 03/23/2013 1621   CALCIUM 8.2 (L) 07/14/2020 0140   CALCIUM 8.9 03/23/2013 1621   PROT 4.9 (L) 07/14/2020 0140   PROT 7.2 06/28/2012 0009   ALBUMIN 2.0 (L) 07/14/2020 0140   ALBUMIN 3.9 06/28/2012 0009   AST 10 (L) 07/14/2020 0140   AST 31 06/28/2012 0009   ALT 8 07/14/2020  0140   ALT 34 06/28/2012 0009   ALKPHOS 119 07/14/2020 0140   ALKPHOS 178 (H) 06/28/2012 0009   BILITOT 0.5 07/14/2020 0140   BILITOT 0.2 06/28/2012 0009   GFRNONAA >60 07/14/2020 0140   GFRNONAA >60 03/23/2013 1621   GFRAA >60 12/18/2019 1504   GFRAA >60 03/23/2013 1621   Assessment:  1. Pancreatitis, acute. Possibly passed CBD stone. Does drink alcohol but don't think that is etiology.  Improving.  MRCP negative choledocholithiasis.  Plan:  1.  De-escalate diet to clear liquids. 2.  Judicious increase in analgesics. 3.  Ambulate halls, OOBTC as tolerated. 4.  If not starting to make improvement tomorrow, would consider repeat CT abd with contrast pancreatic protocol. 5.  [facility] will follow.   Tricia Brown 07/14/2020, 11:23 AM   Cell [phone] If no answer or after 5 PM call [phone]

## 2020-07-14 NOTE — Progress Notes (Signed)
PROGRESS NOTE    Tricia Brown  XLK:[id number] DOB: 05/24/1976 DOA: 07/10/2020 PCP: Lorelee Market, MD   Chief Complaint  Patient presents with  . Abdominal Pain  . Chest Pain   Brief Narrative: As per admitting: 44 y.o. female with medical history significant of endometriosis status post diagnostic laparoscopy and hysterectomy, IBS-D, GERD, status post cholecystectomy presents with abdominal pain x4 days, epigastric and left upper quadrant with associated nausea, vomiting and diarrhea, pain rated 4 out of 10 and when flares of up to 8 out of 10 with certain movement and following meals, not improved by Tums and daily PPI at home.  Drinks alcohol about sixpack on occasion on weekends. In the ED vitals stable labs with hypokalemia 3.0 alk phos 230, leukocytosis 14 K lipase 151 troponin x2 - UA normal CT abdomen showed pancreatitis with fluid collection consistent with pseudocyst reported as "stranding noted around the pancreatic body and tail compatible with acute pancreatitis. Adjacent fluid collections, the largest 3.8 cm most compatible with acute fluid collections and developing pseudocysts. Secondary reactive inflammation of the adjacent gastric wall. Small to moderate free fluid in the pelvis"  Patient was given IV fluids pain medication Zofran and admission was requested. GI was consulted.  Had MRCP no evidence of choledocholithiasis. Patient being treated for acute pancreatitis, possibly passed CBD stone.  Subjective: Patient had episode of vomiting this morning on full liquid diet. continues to require IV Toradol IV Dilaudid and oral oxycodone for pain management.  Assessment & Plan:  Acute pancreatitis possibly passed CBD stone but does drink alcohol but felt to be less likely the cause per GI.  MRCP negative for choledocholithiasis.  Clinically improving continue diet currently on full liquid diet advance as tolerated as per GI appreciate input.  Continue pain regimen with po  oral oxycodone, and encourage oral use more than IV, wean down IV Toradol and Dilaudid.  Add Bentyl for spasm, diet as tolerated.  Loose stool intermittently, after starting oral diet likely in relation to #1.  No leukocytosis or fever.  Monitor  Hypokalemia resolved.  Hypoalbuminemia in the setting of acute pancreatitis augment diet as tolerated.  Mild metabolic acidosis/dehydration due to #1.  Resolved.  Continue IV fluid hydration as for #1  Chronic back pain, controlled. Depression: Mood is stable. Leukocytosis likely in the setting of #1RESOLVED.  Nutrition: Diet Order            Diet full liquid Room service appropriate? Yes; Fluid consistency: Thin  Diet effective now                 There is no height or weight on file to calculate BMI. DVT prophylaxis: enoxaparin (LOVENOX) injection 40 mg Start: 07/11/20 1000 Code Status:   Code Status: Full Code  Family Communication: plan of care discssed with patient at bedside.  Status is: Inpatient Remains inpatient appropriate because:IV treatments appropriate due to intensity of illness or inability to take PO, Inpatient level of care appropriate due to severity of illness and Ongoing management of acute pancreatitis with pseudocyst  Dispo: The patient is from: Home              Anticipated d/c is to: Home              Anticipated d/c date is:2 days              Patient currently is not medically stable to d/c.  Consultants:see note  Procedures:see note  MR I/MRCP abdomen 10/21 1. No  significant change in findings consistent with acute interstitial edematous pancreatitis. No evidence of pancreatic hemorrhage or necrosis. 2. Stable peripherally enhancing fluid collections, primarily in the lesser sac, consistent with acute peripancreatic fluid collections. 3. No biliary dilatation post cholecystectomy. No evidence of choledocholithiasis. 4. Stable focal airspace disease peripherally at the right lung base, likely  pneumonia or atelectasis  Culture/Microbiology    Component Value Date/Time   SDES URINE, CLEAN CATCH 05/12/2019 1507   SPECREQUEST  05/12/2019 1507    NONE Performed at [facility], [address], [place], [place] [address]    CULT  05/12/2019 1507    Multiple bacterial morphotypes present, none predominant. Suggest appropriate recollection if clinically indicated.   REPTSTATUS 05/13/2019 FINAL 05/12/2019 1507    Other culture-see note  Medications: Scheduled Meds: . enoxaparin (LOVENOX) injection  40 mg Subcutaneous Q24H  . estradiol  1 mg Oral Daily  . mometasone-formoterol  2 puff Inhalation BID  . pantoprazole  40 mg Oral Daily  . potassium chloride  20 mEq Oral Daily  . sodium chloride flush  3 mL Intravenous Q12H   Continuous Infusions: . lactated ringers 150 mL/hr at 07/14/20 5500    Antimicrobials: Anti-infectives (From admission, onward)   None     Objective: Vitals: Today's Vitals   07/14/20 0449 07/14/20 0654 07/14/20 0803 07/14/20 0901  BP: 123/77     Pulse: 98     Resp: 17     Temp: 98.4 F (36.9 C)     TempSrc: Oral     SpO2: 100%   99%  PainSc:  7  6      Intake/Output Summary (Last 24 hours) at 07/14/2020 0919 Last data filed at 07/14/2020 0450 Gross per 24 hour  Intake 360 ml  Output --  Net 360 ml   There were no vitals filed for this visit. Weight change:   Intake/Output from previous day: 10/23 0701 - 10/24 0700 In: 360 [P.O.:360] Out: -  Intake/Output this shift: No intake/output data recorded.  Examination: General exam: AAOx3 , NAD, weak appearing. HEENT:Oral mucosa moist, Ear/Nose WNL grossly, dentition normal. Respiratory system: bilaterally clear,no wheezing or crackles,no use of accessory muscle Cardiovascular system: S1 & S2 +, No JVD,. Gastrointestinal system: Abdomen soft, mild tenderness on the mid abdomen,ND, BS+ Nervous System:Alert, awake, moving extremities and grossly nonfocal Extremities: No edema,  distal peripheral pulses palpable.  Skin: No rashes,no icterus. MSK: Normal muscle bulk,tone, power  Data Reviewed: I have personally reviewed following labs and imaging studies CBC: Recent Labs  Lab 07/10/20 1102 07/10/20 1102 07/10/20 1424 07/11/20 0628 07/12/20 0128 07/13/20 0313 07/14/20 0140  WBC 14.6*   < > 13.7* 9.6 12.0* 10.3 9.7  NEUTROABS 11.1*  --   --   --   --   --   --   HGB 12.4   < > 11.2* 10.2* 11.1* 10.5* 9.7*  HCT 38.0   < > 34.2* 31.7* 34.0* 31.9* 29.9*  MCV 95.5   < > 97.7 97.8 98.6 96.7 97.4  PLT 681*   < > 619* 512* 531* 467* 465*   < > = values in this interval not displayed.   Basic Metabolic Panel: Recent Labs  Lab 07/10/20 1424 07/10/20 2357 07/11/20 0628 07/12/20 0128 07/13/20 0313 07/14/20 0140  NA 139  --  141 138 139 141  K 3.1*  --  3.8 3.7 2.9* 3.6  CL 106  --  110 107 106 109  CO2 20*  --  21*  18* 22 24  GLUCOSE 90  --  67* 77 84 104*  BUN 6  --  5* <5* <5* <5*  CREATININE 0.75  --  0.72 0.85 0.67 0.60  CALCIUM 8.5*  --  8.5* 8.3* 8.0* 8.2*  MG  --  1.9  --   --   --   --    GFR: CrCl cannot be calculated (Unknown ideal weight.). Liver Function Tests: Recent Labs  Lab 07/10/20 1424 07/11/20 0628 07/12/20 0128 07/13/20 0313 07/14/20 0140  AST 13* 11* 12* 8* 10*  ALT $Re'10 9 9 6 8  'vfv$ ALKPHOS 194* 183* 161* 132* 119  BILITOT 0.5 0.5 0.9 0.5 0.5  PROT 5.9* 5.5* 5.1* 5.0* 4.9*  ALBUMIN 2.6* 2.3* 2.3* 2.0* 2.0*   Recent Labs  Lab 07/10/20 1102 07/10/20 1424 07/13/20 0313  LIPASE 151* 128* 101*   No results for input(s): AMMONIA in the last 168 hours. Coagulation Profile: No results for input(s): INR, PROTIME in the last 168 hours. Cardiac Enzymes: No results for input(s): CKTOTAL, CKMB, CKMBINDEX, TROPONINI in the last 168 hours. BNP (last 3 results) No results for input(s): PROBNP in the last 8760 hours. HbA1C: No results for input(s): HGBA1C in the last 72 hours. CBG: No results for input(s): GLUCAP in the last 168  hours. Lipid Profile: No results for input(s): CHOL, HDL, LDLCALC, TRIG, CHOLHDL, LDLDIRECT in the last 72 hours. Thyroid Function Tests: No results for input(s): TSH, T4TOTAL, FREET4, T3FREE, THYROIDAB in the last 72 hours. Anemia Panel: No results for input(s): VITAMINB12, FOLATE, FERRITIN, TIBC, IRON, RETICCTPCT in the last 72 hours. Sepsis Labs: No results for input(s): PROCALCITON, LATICACIDVEN in the last 168 hours.  Recent Results (from the past 240 hour(s))  Respiratory Panel by RT PCR (Flu A&B, Covid) - Nasopharyngeal Swab     Status: None   Collection Time: 07/11/20  4:18 AM   Specimen: Nasopharyngeal Swab  Result Value Ref Range Status   SARS Coronavirus 2 by RT PCR NEGATIVE NEGATIVE Final    Comment: (NOTE) SARS-CoV-2 target nucleic acids are NOT DETECTED.  The SARS-CoV-2 RNA is generally detectable in upper respiratoy specimens during the acute phase of infection. The lowest concentration of SARS-CoV-2 viral copies this assay can detect is 131 copies/mL. A negative result does not preclude SARS-Cov-2 infection and should not be used as the sole basis for treatment or other patient management decisions. A negative result may occur with  improper specimen collection/handling, submission of specimen other than nasopharyngeal swab, presence of viral mutation(s) within the areas targeted by this assay, and inadequate number of viral copies (<131 copies/mL). A negative result must be combined with clinical observations, patient history, and epidemiological information. The expected result is Negative.  Fact Sheet for Patients:  PinkCheek.be  Fact Sheet for Healthcare Providers:  GravelBags.it  This test is no t yet approved or cleared by the [place] FDA and  has been authorized for detection and/or diagnosis of SARS-CoV-2 by FDA under an Emergency Use Authorization (EUA). This EUA will remain  in effect  (meaning this test can be used) for the duration of the COVID-19 declaration under Section 564(b)(1) of the Act, 21 U.S.C. section 360bbb-3(b)(1), unless the authorization is terminated or revoked sooner.     Influenza A by PCR NEGATIVE NEGATIVE Final   Influenza B by PCR NEGATIVE NEGATIVE Final    Comment: (NOTE) The Xpert Xpress SARS-CoV-2/FLU/RSV assay is intended as an aid in  the diagnosis of influenza from Nasopharyngeal swab specimens and  should not be used as a sole basis for treatment. Nasal washings and  aspirates are unacceptable for Xpert Xpress SARS-CoV-2/FLU/RSV  testing.  Fact Sheet for Patients: PinkCheek.be  Fact Sheet for Healthcare Providers: GravelBags.it  This test is not yet approved or cleared by the [place] FDA and  has been authorized for detection and/or diagnosis of SARS-CoV-2 by  FDA under an Emergency Use Authorization (EUA). This EUA will remain  in effect (meaning this test can be used) for the duration of the  Covid-19 declaration under Section 564(b)(1) of the Act, 21  U.S.C. section 360bbb-3(b)(1), unless the authorization is  terminated or revoked. Performed at [facility], [place] [address], [place], [place] [address]      Radiology Studies: No results found.   LOS: 4 days   Antonieta Pert, MD [facility]  07/14/2020, 9:19 AM

## 2020-07-15 ENCOUNTER — Inpatient Hospital Stay (HOSPITAL_COMMUNITY): Payer: Medicaid Other

## 2020-07-15 DIAGNOSIS — K859 Acute pancreatitis without necrosis or infection, unspecified: Secondary | ICD-10-CM | POA: Diagnosis not present

## 2020-07-15 MED ORDER — IOHEXOL 300 MG/ML  SOLN
100.0000 mL | Freq: Once | INTRAMUSCULAR | Status: AC | PRN
  Administered 2020-07-15: 100 mL via INTRAVENOUS

## 2020-07-15 MED ORDER — ADULT MULTIVITAMIN W/MINERALS CH
1.0000 | ORAL_TABLET | Freq: Every day | ORAL | Status: DC
  Administered 2020-07-15 – 2020-07-18 (×4): 1 via ORAL
  Filled 2020-07-15 (×4): qty 1

## 2020-07-15 MED ORDER — BOOST / RESOURCE BREEZE PO LIQD CUSTOM
1.0000 | Freq: Three times a day (TID) | ORAL | Status: DC
  Administered 2020-07-15 – 2020-07-18 (×9): 1 via ORAL

## 2020-07-15 NOTE — Progress Notes (Signed)
PROGRESS NOTE    Tricia Brown  ERD:[id number] DOB: [birth year] DOA: 07/10/2020 PCP: Lorelee Market, MD   Chief Complaint  Patient presents with  . Abdominal Pain  . Chest Pain   Brief Narrative: As per admitting: 44 y.o. female with medical history significant of endometriosis status post diagnostic laparoscopy and hysterectomy, IBS-D, GERD, status post cholecystectomy presents with abdominal pain x4 days, epigastric and left upper quadrant with associated nausea, vomiting and diarrhea, pain rated 4 out of 10 and when flares of up to 8 out of 10 with certain movement and following meals, not improved by Tums and daily PPI at home.  Drinks alcohol about sixpack on occasion on weekends. In the ED vitals stable labs with hypokalemia 3.0 alk phos 230, leukocytosis 14 K lipase 151 troponin x2 - UA normal CT abdomen showed pancreatitis with fluid collection consistent with pseudocyst reported as "stranding noted around the pancreatic body and tail compatible with acute pancreatitis. Adjacent fluid collections, the largest 3.8 cm most compatible with acute fluid collections and developing pseudocysts. Secondary reactive inflammation of the adjacent gastric wall. Small to moderate free fluid in the pelvis" Patient was given IV fluids pain medication Zofran and admission was requested. GI was consulted.  Had MRCP no evidence of choledocholithiasis. Patient being treated for acute pancreatitis, possibly passed CBD stone.  Subjective: Diet scaled back to clear liquid diet diet due to vomiting.  On the bedside chair. Complains of ongoing abdominal pain and diarrhea.  Diarrhea starts as soon as she eats anything.  Assessment & Plan:  Acute pancreatitis possibly passed CBD stone but does drink alcohol but felt to be less likely the cause per GI.  MRCP negative for choledocholithiasis.  Patient had vomiting and also diarrhea and diet were changed to clear liquid diet from full liquid diet 10/24.   Complains of ongoing abdominal pain.  GI input appreciated and planning for repeat CT abdomen pelvis with contrast.  Continue on aggressive Ringer's lactate, pain management with po oral oxycodone, Toradol and Dilaudid.  Cont  Prn Bentyl  Loose stool intermittently, after starting oral diet likely in relation to #1.  Monitor continue hydration.   Hypokalemia resolved.  Hypoalbuminemia in the setting of acute pancreatitis augment diet as tolerated.  Anemia of chronic disease hemoglobin ranging from 9 to 11 g.  Mild metabolic acidosis/dehydration due to #1.  Resolved on IV fluids. Chronic back pain, controlled. Depression: Mood is stable. Leukocytosis likely in the setting of # resolved.  Nutrition: Diet Order            Diet clear liquid Room service appropriate? Yes; Fluid consistency: Thin  Diet effective now                 There is no height or weight on file to calculate BMI. DVT prophylaxis: enoxaparin (LOVENOX) injection 40 mg Start: 07/11/20 1000 Code Status:   Code Status: Full Code  Family Communication: plan of care discssed with patient at bedside.  Status is: Inpatient Remains inpatient appropriate because:IV treatments appropriate due to intensity of illness or inability to take PO, Inpatient level of care appropriate due to severity of illness and Ongoing management of acute pancreatitis with pseudocyst  Dispo: The patient is from: Home              Anticipated d/c is to: Home              Anticipated d/c date is:2 days  Patient currently is not medically stable to d/c.  Consultants:see note  Procedures:see note  MR I/MRCP abdomen 10/21 1. No significant change in findings consistent with acute interstitial edematous pancreatitis. No evidence of pancreatic hemorrhage or necrosis. 2. Stable peripherally enhancing fluid collections, primarily in the lesser sac, consistent with acute peripancreatic fluid collections. 3. No biliary dilatation post  cholecystectomy. No evidence of choledocholithiasis. 4. Stable focal airspace disease peripherally at the right lung base, likely pneumonia or atelectasis  Culture/Microbiology    Component Value Date/Time   SDES URINE, CLEAN CATCH 05/12/2019 1507   SPECREQUEST  05/12/2019 1507    NONE Performed at [facility], [address], [place], [place] [address]    CULT  05/12/2019 1507    Multiple bacterial morphotypes present, none predominant. Suggest appropriate recollection if clinically indicated.   REPTSTATUS 05/13/2019 FINAL 05/12/2019 1507    Other culture-see note  Medications: Scheduled Meds: . enoxaparin (LOVENOX) injection  40 mg Subcutaneous Q24H  . estradiol  1 mg Oral Daily  . feeding supplement  1 Container Oral TID BM  . mometasone-formoterol  2 puff Inhalation BID  . multivitamin with minerals  1 tablet Oral Daily  . pantoprazole  40 mg Oral Daily  . potassium chloride  20 mEq Oral Daily  . sodium chloride flush  3 mL Intravenous Q12H   Continuous Infusions: . lactated ringers 150 mL/hr at 07/15/20 1411    Antimicrobials: Anti-infectives (From admission, onward)   None     Objective: Vitals: Today's Vitals   07/15/20 0447 07/15/20 0747 07/15/20 0852 07/15/20 1105  BP: 125/77     Pulse: 91  90   Resp: 20  18   Temp: 97.9 F (36.6 C)     TempSrc: Oral     SpO2: 100%  100%   PainSc:  8   8     Intake/Output Summary (Last 24 hours) at 07/15/2020 1121 Last data filed at 07/15/2020 0848 Gross per 24 hour  Intake 880 ml  Output --  Net 880 ml   There were no vitals filed for this visit. Weight change:   Intake/Output from previous day: 10/24 0701 - 10/25 0700 In: 1000 [P.O.:1000] Out: -  Intake/Output this shift: Total I/O In: 300 [P.O.:300] Out: -   Examination: General exam: AAOx , NAD, weak appearing. HEENT:Oral mucosa moist, Ear/Nose WNL grossly, dentition normal. Respiratory system: bilaterally clear,no wheezing or crackles,no  use of accessory muscle Cardiovascular system: S1 & S2 +, No JVD,. Gastrointestinal system: Abdomen soft, diffuse tenderness present,ND, BS+ Nervous System:Alert, awake, moving extremities and grossly nonfocal Extremities: No edema, distal peripheral pulses palpable.  Skin: No rashes,no icterus. MSK: Normal muscle bulk,tone, power  Data Reviewed: I have personally reviewed following labs and imaging studies CBC: Recent Labs  Lab 07/10/20 1102 07/10/20 1102 07/10/20 1424 07/11/20 0628 07/12/20 0128 07/13/20 0313 07/14/20 0140  WBC 14.6*   < > 13.7* 9.6 12.0* 10.3 9.7  NEUTROABS 11.1*  --   --   --   --   --   --   HGB 12.4   < > 11.2* 10.2* 11.1* 10.5* 9.7*  HCT 38.0   < > 34.2* 31.7* 34.0* 31.9* 29.9*  MCV 95.5   < > 97.7 97.8 98.6 96.7 97.4  PLT 681*   < > 619* 512* 531* 467* 465*   < > = values in this interval not displayed.   Basic Metabolic Panel: Recent Labs  Lab 07/10/20 1424 07/10/20 2357 07/11/20 5021 07/12/20  0128 07/13/20 0313 07/14/20 0140  NA 139  --  141 138 139 141  K 3.1*  --  3.8 3.7 2.9* 3.6  CL 106  --  110 107 106 109  CO2 20*  --  21* 18* 22 24  GLUCOSE 90  --  67* 77 84 104*  BUN 6  --  5* <5* <5* <5*  CREATININE 0.75  --  0.72 0.85 0.67 0.60  CALCIUM 8.5*  --  8.5* 8.3* 8.0* 8.2*  MG  --  1.9  --   --   --   --    GFR: CrCl cannot be calculated (Unknown ideal weight.). Liver Function Tests: Recent Labs  Lab 07/10/20 1424 07/11/20 0628 07/12/20 0128 07/13/20 0313 07/14/20 0140  AST 13* 11* 12* 8* 10*  ALT $Re'10 9 9 6 8  'ugI$ ALKPHOS 194* 183* 161* 132* 119  BILITOT 0.5 0.5 0.9 0.5 0.5  PROT 5.9* 5.5* 5.1* 5.0* 4.9*  ALBUMIN 2.6* 2.3* 2.3* 2.0* 2.0*   Recent Labs  Lab 07/10/20 1102 07/10/20 1424 07/13/20 0313  LIPASE 151* 128* 101*   No results for input(s): AMMONIA in the last 168 hours. Coagulation Profile: No results for input(s): INR, PROTIME in the last 168 hours. Cardiac Enzymes: No results for input(s): CKTOTAL, CKMB,  CKMBINDEX, TROPONINI in the last 168 hours. BNP (last 3 results) No results for input(s): PROBNP in the last 8760 hours. HbA1C: No results for input(s): HGBA1C in the last 72 hours. CBG: No results for input(s): GLUCAP in the last 168 hours. Lipid Profile: No results for input(s): CHOL, HDL, LDLCALC, TRIG, CHOLHDL, LDLDIRECT in the last 72 hours. Thyroid Function Tests: No results for input(s): TSH, T4TOTAL, FREET4, T3FREE, THYROIDAB in the last 72 hours. Anemia Panel: No results for input(s): VITAMINB12, FOLATE, FERRITIN, TIBC, IRON, RETICCTPCT in the last 72 hours. Sepsis Labs: No results for input(s): PROCALCITON, LATICACIDVEN in the last 168 hours.  Recent Results (from the past 240 hour(s))  Respiratory Panel by RT PCR (Flu A&B, Covid) - Nasopharyngeal Swab     Status: None   Collection Time: 07/11/20  4:18 AM   Specimen: Nasopharyngeal Swab  Result Value Ref Range Status   SARS Coronavirus 2 by RT PCR NEGATIVE NEGATIVE Final    Comment: (NOTE) SARS-CoV-2 target nucleic acids are NOT DETECTED.  The SARS-CoV-2 RNA is generally detectable in upper respiratoy specimens during the acute phase of infection. The lowest concentration of SARS-CoV-2 viral copies this assay can detect is 131 copies/mL. A negative result does not preclude SARS-Cov-2 infection and should not be used as the sole basis for treatment or other patient management decisions. A negative result may occur with  improper specimen collection/handling, submission of specimen other than nasopharyngeal swab, presence of viral mutation(s) within the areas targeted by this assay, and inadequate number of viral copies (<131 copies/mL). A negative result must be combined with clinical observations, patient history, and epidemiological information. The expected result is Negative.  Fact Sheet for Patients:  PinkCheek.be  Fact Sheet for Healthcare Providers:    GravelBags.it  This test is no t yet approved or cleared by the [place] FDA and  has been authorized for detection and/or diagnosis of SARS-CoV-2 by FDA under an Emergency Use Authorization (EUA). This EUA will remain  in effect (meaning this test can be used) for the duration of the COVID-19 declaration under Section 564(b)(1) of the Act, 21 U.S.C. section 360bbb-3(b)(1), unless the authorization is terminated or revoked sooner.  Influenza A by PCR NEGATIVE NEGATIVE Final   Influenza B by PCR NEGATIVE NEGATIVE Final    Comment: (NOTE) The Xpert Xpress SARS-CoV-2/FLU/RSV assay is intended as an aid in  the diagnosis of influenza from Nasopharyngeal swab specimens and  should not be used as a sole basis for treatment. Nasal washings and  aspirates are unacceptable for Xpert Xpress SARS-CoV-2/FLU/RSV  testing.  Fact Sheet for Patients: PinkCheek.be  Fact Sheet for Healthcare Providers: GravelBags.it  This test is not yet approved or cleared by the [place] FDA and  has been authorized for detection and/or diagnosis of SARS-CoV-2 by  FDA under an Emergency Use Authorization (EUA). This EUA will remain  in effect (meaning this test can be used) for the duration of the  Covid-19 declaration under Section 564(b)(1) of the Act, 21  U.S.C. section 360bbb-3(b)(1), unless the authorization is  terminated or revoked. Performed at [facility], [place] [address] [place], [place] [address]      Radiology Studies: No results found.   LOS: 5 days   Antonieta Pert, MD [facility]  07/15/2020, 11:21 AM

## 2020-07-15 NOTE — Progress Notes (Signed)
[facility]ology Progress Note  Tricia Brown 44 y.o. Jul 04, 1976  CC:  Acute  pancreatitis with pseudocyst  Subjective: Patient reports worsening of abdominal pain since she transitioned to full liquid diet yesterday.  Currently, pain is a 7/10.  Pain is in the epigastric region with radiation to the right side and to the back.  Patient is tearful.  States she has been having nausea, vomiting, and diarrhea as well and is unable to keep any liquids down.  ROS : Review of Systems  Cardiovascular: Negative for chest pain and palpitations.  Gastrointestinal: Positive for abdominal pain, diarrhea, nausea and vomiting. Negative for blood in stool, constipation, heartburn and melena.   Objective: Vital signs in last 24 hours: Vitals:   07/15/20 0447 07/15/20 0852  BP: 125/77   Pulse: 91 90  Resp: 20 18  Temp: 97.9 F (36.6 C)   SpO2: 100% 100%    Physical Exam:  General:  Alert, cooperative, tearful, no acute distress  Head:  Normocephalic, without obvious abnormality, atraumatic  Eyes:  Anicteric sclera, EOMs intact  Lungs:   Clear to auscultation bilaterally, respirations unlabored  Heart:  Regular rate and rhythm, S1, S2 normal  Abdomen:   Soft and mildly distended with moderate diffuse tenderness with guarding, sluggish bowel sounds  Extremities: Extremities normal, atraumatic, no  edema  Pulses: 2+ and symmetric    Lab Results: Recent Labs    07/13/20 0313 07/14/20 0140  NA 139 141  K 2.9* 3.6  CL 106 109  CO2 22 24  GLUCOSE 84 104*  BUN <5* <5*  CREATININE 0.67 0.60  CALCIUM 8.0* 8.2*   Recent Labs    07/13/20 0313 07/14/20 0140  AST 8* 10*  ALT 6 8  ALKPHOS 132* 119  BILITOT 0.5 0.5  PROT 5.0* 4.9*  ALBUMIN 2.0* 2.0*   Recent Labs    07/13/20 0313 07/14/20 0140  WBC 10.3 9.7  HGB 10.5* 9.7*  HCT 31.9* 29.9*  MCV 96.7 97.4  PLT 467* 465*   No results for input(s): LABPROT, INR in the last 72 hours.    Assessment/Plan: Acute pancreatitis  with pseudocyst formation. Worsening pain with advancement to full liquid diet. Unclear etiology.  Post-cholecystectomy, normal lipids.  She denies frequent or heavy alcohol use.  ?passed de novo CBD stone vs. idiopathic -CT 10/20 revealed acute pancreatitis with adjacent fluid collections, the largest 3.8 cm, most compatible with acute fluid collections and developing pseudocysts.   -MRI 10/21: . No biliary dilatation post cholecystectomy. No evidence of choledocholithiasis. No significant change in findings consistent with acute interstitial edematous pancreatitis. No evidence of pancreatic hemorrhage or necrosis. -Normal renal function: BUN <5/ Cr 0.60 -WBCs within normal limits: 9.7 today as compared to 14.6 on arrival -Triglycerides normal (71) as of 10/20 -Normal LFTs: T. Bili 0.5/ AST 10/ ALT 8/ ALP 119 as of 10/25  Plan: Repeat CT of abdomen with IV contrast.  Continue IV fluids at a rate of 150 cc/h.  Pain control.  Clear liquid diet.  [facility] will follow.  Edrick Kins PA-C 07/15/2020, 10:31 AM  Contact #  [phone]

## 2020-07-15 NOTE — Progress Notes (Signed)
Nutrition Follow-up  DOCUMENTATION CODES:   Not applicable  INTERVENTION:   -Boost Breeze po TID, each supplement provides 250 kcal and 9 grams of protein -MVI with minerals daily -RD will follow for diet advancement and adjust supplement regimen as appropriate  NUTRITION DIAGNOSIS:   Inadequate oral intake related to altered GI function as evidenced by NPO status.  Progressing; now on clear liquid diet  GOAL:   Patient will meet greater than or equal to 90% of their needs  Progressing   MONITOR:   Diet advancement, PO intake, Supplement acceptance, Labs, Weight trends, Skin, I & O's  REASON FOR ASSESSMENT:   Malnutrition Screening Tool    ASSESSMENT:   Tricia Brown is a 44 y.o. female with medical history significant of endometriosis status post diagnostic laparoscopy and hysterectomy, IBS-D, GERD, status post cholecystectomy who presents with abdominal pain.  Reviewed I/O's: +1 L x 24 hours and +4.3 L since admission  Per GI notes, MRCP revealed pancreatitis. Pt diet was advanced to full liquids yesterday, however, now is downgraded to clears secondary to vomiting. Plan for repeat CT of abdomen with contrast is pt shows no improvement tomorrow.   Labs reviewed.   Diet Order:   Diet Order            Diet clear liquid Room service appropriate? Yes; Fluid consistency: Thin  Diet effective now                 EDUCATION NEEDS:   Education needs have been addressed  Skin:  Skin Assessment: Reviewed RN Assessment  Last BM:  07/15/20  Height:   Ht Readings from Last 1 Encounters:  02/10/20 5\' 7"  (1.702 m)    Weight:   Wt Readings from Last 1 Encounters:  02/10/20 63.5 kg    Ideal Body Weight:  61.4 kg  BMI:  There is no height or weight on file to calculate BMI.  Estimated Nutritional Needs:   Kcal:  1900-2100  Protein:  95-110 grams  Fluid:  > 1.9 L    02/12/20, RD, LDN, CDCES Registered Dietitian II Certified Diabetes Care and  Education Specialist Please refer to [facility] for RD and/or RD on-call/weekend/after hours pager

## 2020-07-16 DIAGNOSIS — K859 Acute pancreatitis without necrosis or infection, unspecified: Secondary | ICD-10-CM | POA: Diagnosis not present

## 2020-07-16 LAB — CBC
HCT: 29.2 % — ABNORMAL LOW (ref 36.0–46.0)
Hemoglobin: 9.4 g/dL — ABNORMAL LOW (ref 12.0–15.0)
MCH: 31.8 pg (ref 26.0–34.0)
MCHC: 32.2 g/dL (ref 30.0–36.0)
MCV: 98.6 fL (ref 80.0–100.0)
Platelets: 458 10*3/uL — ABNORMAL HIGH (ref 150–400)
RBC: 2.96 MIL/uL — ABNORMAL LOW (ref 3.87–5.11)
RDW: 12.9 % (ref 11.5–15.5)
WBC: 11.4 10*3/uL — ABNORMAL HIGH (ref 4.0–10.5)
nRBC: 0 % (ref 0.0–0.2)

## 2020-07-16 LAB — COMPREHENSIVE METABOLIC PANEL
ALT: 8 U/L (ref 0–44)
AST: 10 U/L — ABNORMAL LOW (ref 15–41)
Albumin: 2.2 g/dL — ABNORMAL LOW (ref 3.5–5.0)
Alkaline Phosphatase: 147 U/L — ABNORMAL HIGH (ref 38–126)
Anion gap: 11 (ref 5–15)
BUN: <5 mg/dL — ABNORMAL LOW (ref 6–20)
CO2: 29 mmol/L (ref 22–32)
Calcium: 8.5 mg/dL — ABNORMAL LOW (ref 8.9–10.3)
Chloride: 99 mmol/L (ref 98–111)
Creatinine, Ser: 0.63 mg/dL (ref 0.44–1.00)
GFR, Estimated: >60 mL/min (ref >60)
Glucose, Bld: 87 mg/dL (ref 70–99)
Potassium: 3.3 mmol/L — ABNORMAL LOW (ref 3.5–5.1)
Sodium: 139 mmol/L (ref 135–145)
Total Bilirubin: 0.4 mg/dL (ref 0.3–1.2)
Total Protein: 5.1 g/dL — ABNORMAL LOW (ref 6.5–8.1)

## 2020-07-16 MED ORDER — PANTOPRAZOLE SODIUM 40 MG IV SOLR
40.0000 mg | Freq: Two times a day (BID) | INTRAVENOUS | Status: DC
  Administered 2020-07-16 – 2020-07-23 (×15): 40 mg via INTRAVENOUS
  Filled 2020-07-16 (×15): qty 40

## 2020-07-16 MED ORDER — POTASSIUM CHLORIDE CRYS ER 20 MEQ PO TBCR
40.0000 meq | EXTENDED_RELEASE_TABLET | Freq: Once | ORAL | Status: AC
  Administered 2020-07-16: 40 meq via ORAL
  Filled 2020-07-16: qty 2

## 2020-07-16 MED ORDER — PROSOURCE PLUS PO LIQD
30.0000 mL | Freq: Two times a day (BID) | ORAL | Status: DC
  Administered 2020-07-17 – 2020-07-18 (×3): 30 mL via ORAL
  Filled 2020-07-16 (×3): qty 30

## 2020-07-16 MED ORDER — OXYCODONE HCL 5 MG PO TABS
10.0000 mg | ORAL_TABLET | ORAL | Status: DC | PRN
  Administered 2020-07-16 – 2020-07-18 (×11): 10 mg via ORAL
  Filled 2020-07-16 (×11): qty 2

## 2020-07-16 NOTE — Progress Notes (Signed)
   07/16/20 1500  Assess: MEWS Score  Temp 97.9 F (36.6 C)  BP 135/84  Pulse Rate (!) 115  Resp 16  Level of Consciousness Alert  SpO2 100 %  O2 Device Room Air  Assess: MEWS Score  MEWS Temp 0  MEWS Systolic 0  MEWS Pulse 2  MEWS RR 0  MEWS LOC 0  MEWS Score 2  MEWS Score Color Yellow  Assess: if the MEWS score is Yellow or Red  Were vital signs taken at a resting state? Yes  Focused Assessment No change from prior assessment  Early Detection of Sepsis Score *See Row Information* Low  MEWS guidelines implemented *See Row Information* No, vital signs rechecked  Treat  Pain Scale 0-10  Pain Score 9  Document  Patient Outcome Other (Comment) (remains stable with PRN pain meds)  Progress note created (see row info) Yes    Patient HR elevated with increased pain. PRN pain meds given and VS rechecked.

## 2020-07-16 NOTE — Progress Notes (Signed)
[facility] Gastroenterology Progress Note  Tricia Brown 44 y.o. 08-14-1976  CC:  Acute pancreatitis with pseudocysts  Subjective: Patient reports continued abdominal pain, worse with oral intake.  Tolerating clear liquids, though still has some nausea and diarrhea.  Has not had any vomiting today.  Abdominal pain not well-controlled on current regimen. Patient unable to sleep due to severe pain.  ROS : Review of Systems  Cardiovascular: Negative for chest pain and palpitations.  Gastrointestinal: Positive for abdominal pain, diarrhea, heartburn and nausea. Negative for blood in stool, constipation, melena and vomiting.    Objective: Vital signs in last 24 hours: Vitals:   07/16/20 0855 07/16/20 0902  BP: 133/75   Pulse: 91 (!) 101  Resp: 12 16  Temp: 97.8 F (36.6 C)   SpO2: 100% 100%    Physical Exam:  General:  Alert, cooperative, no distress  Head:  Normocephalic, without obvious abnormality, atraumatic  Eyes:  Anicteric sclera, EOMs intact  Lungs:   Clear to auscultation bilaterally, respirations unlabored  Heart:  Mildly tachycardic with regular rhythm, S1, S2 normal  Abdomen:   Soft, mildly distended, diffuse moderate tenderness with guarding, bowel sounds active all four quadrants  Extremities: Extremities normal, atraumatic, no  edema  Pulses: 2+ and symmetric    Lab Results: Recent Labs    07/14/20 0140 07/16/20 0043  NA 141 139  K 3.6 3.3*  CL 109 99  CO2 24 29  GLUCOSE 104* 87  BUN <5* <5*  CREATININE 0.60 0.63  CALCIUM 8.2* 8.5*   Recent Labs    07/14/20 0140 07/16/20 0043  AST 10* 10*  ALT 8 8  ALKPHOS 119 147*  BILITOT 0.5 0.4  PROT 4.9* 5.1*  ALBUMIN 2.0* 2.2*   Recent Labs    07/14/20 0140 07/16/20 0043  WBC 9.7 11.4*  HGB 9.7* 9.4*  HCT 29.9* 29.2*  MCV 97.4 98.6  PLT 465* 458*   No results for input(s): LABPROT, INR in the last 72 hours.    Assessment: Acute pancreatitis with pseudocyst formation. Worsening pain with  advancement to full liquid diet. Unclear etiology. Post-cholecystectomy, normal lipids. She denies frequent or heavy alcohol use.  ?passed de novo CBD stone vs. idiopathic -CT 10/25 revealed interval development of several pseudocysts. 8.7 x 6.3 cm organized cyst/pseudocyst which has significant mass effect on the body of the stomach.  Improved appearance of the pancreas. Regressing inflammatory changes and no evidence of pancreatic necrosis. -Normal renal function: BUN <5/ Cr 0.63 -WBCs mildly elevated to 11.4 as compared to 9.7 yesterday -Triglycerides normal (71) as of 10/20 -Mildly elevated ALP, otherwise normal LFTs: T. Bili 0.4/ AST 10/ ALT 8/ ALP 147   Plan: Continue supportive care.  Improve pain control.  Continue clear liquid diet.  We will discuss patient's case with Dr. Meridee Score to determine whether or not it is feasible to drain large pseudocysts at this stage.  [facility] will follow.  Edrick Kins PA-C 07/16/2020, 10:58 AM  Contact #  [phone]

## 2020-07-16 NOTE — Progress Notes (Signed)
PROGRESS NOTE    Tricia Brown  UVO:[id number] DOB: [birth year] DOA: 07/10/2020 PCP: Lorelee Market, MD   Chief Complaint  Patient presents with   Abdominal Pain   Chest Pain   Brief Narrative: 44 y.o. female with medical history significant of endometriosis status post diagnostic laparoscopy and hysterectomy, IBS-D, GERD, status post cholecystectomy presents with abdominal pain x4 days, epigastric and left upper quadrant with associated nausea, vomiting and diarrhea, pain rated 4 out of 10 and when flares of up to 8 out of 10 with certain movement and following meals, not improved by Tums and daily PPI at home.  Drinks alcohol about sixpack on occasion on weekends. In the ED vitals stable labs with hypokalemia 3.0 alk phos 230, leukocytosis 14 K lipase 151 troponin x2 - UA normal CT abdomen showed pancreatitis with fluid collection consistent with pseudocyst reported as "stranding noted around the pancreatic body and tail compatible with acute pancreatitis. Adjacent fluid collections, the largest 3.8 cm most compatible with acute fluid collections and developing pseudocysts. Secondary reactive inflammation of the adjacent gastric wall. Small to moderate free fluid in the pelvis" Patient was given IV fluids pain medication Zofran and admission was requested. GI was consulted.  Had MRCP no evidence of choledocholithiasis. Patient being treated for acute pancreatitis, possibly passed CBD stone.  Subjective:  Complains of ongoing abdominal pain, diarrhea.  Would like to ambulate.  On Clear liquid diet.  Assessment & Plan:  Acute pancreatitis pseudocyst formation, possibly passed CBD stone but does drink alcohol but felt to be less likely the cause per GI.  MRCP negative for choledocholithiasis.  Patient complained of abdominal pain some vomiting, diarrhea.  Repeat CT abdomen 10/25 shows multiple pseudocyst pressing on his stomach, GI reviewing need, continue IV pain management ambulation,  IV fluid hydration symptomatic management.   Loose stool intermittently, after starting oral diet likely in relation to #1.  Monitor continue hydration.   Hypokalemia we will replete again.  Hypoalbuminemia in the setting of acute pancreatitis augment diet as tolerated  Anemia of chronic disease hemoglobin ranging 9 to 11 g.  Monitor  Mild metabolic acidosis/dehydration due to #1.  Resolved on IV fluids. Chronic back pain, controlled. Depression: Mood is stable. Leukocytosis likely in the setting of WC count improved but slightly uptrending.  Monitor.  Nutrition: Diet Order            Diet clear liquid Room service appropriate? Yes; Fluid consistency: Thin  Diet effective now                 There is no height or weight on file to calculate BMI. DVT prophylaxis: enoxaparin (LOVENOX) injection 40 mg Start: 07/11/20 1000 Code Status:   Code Status: Full Code  Family Communication: plan of care discssed with patient at bedside.  Status is: Inpatient Remains inpatient appropriate because:IV treatments appropriate due to intensity of illness or inability to take PO, Inpatient level of care appropriate due to severity of illness and Ongoing management of acute pancreatitis with pseudocyst  Dispo: The patient is from: Home              Anticipated d/c is to: Home              Anticipated d/c date is:3 days              Patient currently is not medically stable to d/c.  Consultants:see note  Procedures:see note  MR I/MRCP abdomen 10/21 1. No significant change in findings consistent with  acute interstitial edematous pancreatitis. No evidence of pancreatic hemorrhage or necrosis. 2. Stable peripherally enhancing fluid collections, primarily in the lesser sac, consistent with acute peripancreatic fluid collections. 3. No biliary dilatation post cholecystectomy. No evidence of choledocholithiasis. 4. Stable focal airspace disease peripherally at the right lung base, likely  pneumonia or atelectasis  Culture/Microbiology    Component Value Date/Time   SDES URINE, CLEAN CATCH 05/12/2019 1507   SPECREQUEST  05/12/2019 1507    NONE Performed at [facility], [place] [address], [place], [place] [address]    CULT  05/12/2019 1507    Multiple bacterial morphotypes present, none predominant. Suggest appropriate recollection if clinically indicated.   REPTSTATUS 05/13/2019 FINAL 05/12/2019 1507    Other culture-see note  Medications: Scheduled Meds:  enoxaparin (LOVENOX) injection  40 mg Subcutaneous Q24H   estradiol  1 mg Oral Daily   feeding supplement  1 Container Oral TID BM   mometasone-formoterol  2 puff Inhalation BID   multivitamin with minerals  1 tablet Oral Daily   pantoprazole  40 mg Oral Daily   potassium chloride  20 mEq Oral Daily   sodium chloride flush  3 mL Intravenous Q12H   Continuous Infusions:  lactated ringers 150 mL/hr at 07/16/20 0954    Antimicrobials: Anti-infectives (From admission, onward)   None     Objective: Vitals: Today's Vitals   07/16/20 0855 07/16/20 0902 07/16/20 0946 07/16/20 1112  BP: 133/75     Pulse: 91 (!) 101    Resp: 12 16    Temp: 97.8 F (36.6 C)     TempSrc: Oral     SpO2: 100% 100%    PainSc: _0 Intake/Output Summary (Last 24 hours) at 07/16/2020 1129 Last data filed at 07/16/2020 0900 Gross per 24 hour  Intake 240 ml  Output --  Net 240 ml   There were no vitals filed for this visit. Weight change:   Intake/Output from previous day: 10/25 0701 - 10/26 0700 In: 300 [P.O.:300] Out: -  Intake/Output this shift: Total I/O In: 240 [P.O.:240] Out: -   Examination: General exam: AAOx3 , NAD, weak appearing. HEENT:Oral mucosa moist, Ear/Nose WNL grossly, dentition normal. Respiratory system: bilaterally clear,no wheezing or crackles,no use of accessory muscle Cardiovascular system: S1 & S2 +, No JVD,. Gastrointestinal system: Abdomen soft, distended,  generalized tenderness present bowel sounds hyperactive Nervous System:Alert, awake, moving extremities and grossly nonfocal Extremities: No edema, distal peripheral pulses palpable.  Skin: No rashes,no icterus. MSK: Normal muscle bulk,tone, power  Data Reviewed: I have personally reviewed following labs and imaging studies CBC: Recent Labs  Lab 07/10/20 1102 07/10/20 1424 07/11/20 0628 07/12/20 0128 07/13/20 0313 07/14/20 0140 07/16/20 0043  WBC 14.6*   < > 9.6 12.0* 10.3 9.7 11.4*  NEUTROABS 11.1*  --   --   --   --   --   --   HGB 12.4   < > 10.2* 11.1* 10.5* 9.7* 9.4*  HCT 38.0   < > 31.7* 34.0* 31.9* 29.9* 29.2*  MCV 95.5   < > 97.8 98.6 96.7 97.4 98.6  PLT 681*   < > 512* 531* 467* 465* 458*   < > = values in this interval not displayed.   Basic Metabolic Panel: Recent Labs  Lab 07/10/20 1424 07/10/20 2357 07/11/20 4982 07/12/20 0128 07/13/20 0313 07/14/20 0140 07/16/20 0043  NA   < >  --  141 138 139 141 139  K   < >  --  3.8 3.7 2.9* 3.6 3.3*  CL   < >  --  110 107 106 109 99  CO2   < >  --  21* 18* _0 GLUCOSE   < >  --  67* 77 84 104* 87  BUN   < >  --  5* <5* <5* <5* <5*  CREATININE   < >  --  0.72 0.85 0.67 0.60 0.63  CALCIUM   < >  --  8.5* 8.3* 8.0* 8.2* 8.5*  MG  --  1.9  --   --   --   --   --    < > = values in this interval not displayed.   GFR: CrCl cannot be calculated (Unknown ideal weight.). Liver Function Tests: Recent Labs  Lab 07/11/20 0628 07/12/20 0128 07/13/20 0313 07/14/20 0140 07/16/20 0043  AST 11* 12* 8* 10* 10*  ALT _1 ALKPHOS 183* 161* 132* 119 147*  BILITOT 0.5 0.9 0.5 0.5 0.4  PROT 5.5* 5.1* 5.0* 4.9* 5.1*  ALBUMIN 2.3* 2.3* 2.0* 2.0* 2.2*   Recent Labs  Lab 07/10/20 1102 07/10/20 1424 07/13/20 0313  LIPASE 151* 128* 101*   No results for input(s): AMMONIA in the last 168 hours. Coagulation Profile: No results for input(s): INR, PROTIME in the last 168 hours. Cardiac Enzymes: No results for  input(s): CKTOTAL, CKMB, CKMBINDEX, TROPONINI in the last 168 hours. BNP (last 3 results) No results for input(s): PROBNP in the last 8760 hours. HbA1C: No results for input(s): HGBA1C in the last 72 hours. CBG: No results for input(s): GLUCAP in the last 168 hours. Lipid Profile: No results for input(s): CHOL, HDL, LDLCALC, TRIG, CHOLHDL, LDLDIRECT in the last 72 hours. Thyroid Function Tests: No results for input(s): TSH, T4TOTAL, FREET4, T3FREE, THYROIDAB in the last 72 hours. Anemia Panel: No results for input(s): VITAMINB12, FOLATE, FERRITIN, TIBC, IRON, RETICCTPCT in the last 72 hours. Sepsis Labs: No results for input(s): PROCALCITON, LATICACIDVEN in the last 168 hours.  Recent Results (from the past 240 hour(s))  Respiratory Panel by RT PCR (Flu A&B, Covid) - Nasopharyngeal Swab     Status: None   Collection Time: 07/11/20  4:18 AM   Specimen: Nasopharyngeal Swab  Result Value Ref Range Status   SARS Coronavirus 2 by RT PCR NEGATIVE NEGATIVE Final    Comment: (NOTE) SARS-CoV-2 target nucleic acids are NOT DETECTED.  The SARS-CoV-2 RNA is generally detectable in upper respiratoy specimens during the acute phase of infection. The lowest concentration of SARS-CoV-2 viral copies this assay can detect is 131 copies/mL. A negative result does not preclude SARS-Cov-2 infection and should not be used as the sole basis for treatment or other patient management decisions. A negative result may occur with  improper specimen collection/handling, submission of specimen other than nasopharyngeal swab, presence of viral mutation(s) within the areas targeted by this assay, and inadequate number of viral copies (<131 copies/mL). A negative result must be combined with clinical observations, patient history, and epidemiological information. The expected result is Negative.  Fact Sheet for Patients:  PinkCheek.be  Fact Sheet for Healthcare Providers:    GravelBags.it  This test is no t yet approved or cleared by the [place] FDA and  has been authorized for detection and/or diagnosis of SARS-CoV-2 by FDA under an Emergency Use Authorization (EUA). This EUA will remain  in effect (meaning this test can be used) for the  duration of the COVID-19 declaration under Section 564(b)(1) of the Act, 21 U.S.C. section 360bbb-3(b)(1), unless the authorization is terminated or revoked sooner.     Influenza A by PCR NEGATIVE NEGATIVE Final   Influenza B by PCR NEGATIVE NEGATIVE Final    Comment: (NOTE) The Xpert Xpress SARS-CoV-2/FLU/RSV assay is intended as an aid in  the diagnosis of influenza from Nasopharyngeal swab specimens and  should not be used as a sole basis for treatment. Nasal washings and  aspirates are unacceptable for Xpert Xpress SARS-CoV-2/FLU/RSV  testing.  Fact Sheet for Patients: PinkCheek.be  Fact Sheet for Healthcare Providers: GravelBags.it  This test is not yet approved or cleared by the [place] FDA and  has been authorized for detection and/or diagnosis of SARS-CoV-2 by  FDA under an Emergency Use Authorization (EUA). This EUA will remain  in effect (meaning this test can be used) for the duration of the  Covid-19 declaration under Section 564(b)(1) of the Act, 21  U.S.C. section 360bbb-3(b)(1), unless the authorization is  terminated or revoked. Performed at [facility], [place] [address] [place], [place] [address]      Radiology Studies: CT ABDOMEN PELVIS W WO CONTRAST  Result Date: 07/15/2020 CLINICAL DATA:  Follow-up pancreatitis. EXAM: CT ABDOMEN AND PELVIS WITHOUT AND WITH CONTRAST TECHNIQUE: Multidetector CT imaging of the abdomen and pelvis was performed following the standard protocol before and following the bolus administration of intravenous contrast. CONTRAST:  131m OMNIPAQUE IOHEXOL 300 MG/ML   SOLN COMPARISON:  CT scan 07/10/2020 and MRI 07/11/2020 FINDINGS: Lower chest: The lung bases are clear of an acute process. No pleural effusions or pulmonary infiltrates. Hepatobiliary: No hepatic lesions or intrahepatic biliary dilatation. The portal and hepatic veins are patent. The gallbladder is surgically absent. No common bile duct dilatation. Pancreas: Mild residual inflammatory changes involving the pancreas. Interval organized slight rim enhancing fluid collections in and around the pancreas. There is a 12 mm lesion in the tail region the pancreas. There is a 14 mm cyst projecting off the ventral aspect of the pancreas on image 31/3. 2.8 cm cystic lesion appears to be in the posterior wall of the stomach and creates some mass effect on the lumen. This may communicate with a larger more complex fluid collection in the lesser sac and also likely in the posterior wall the stomach. This collection measures 4.5 cm. 4.7 cm organized cystic structure in the lesser sac between the pancreas, the stomach and the spleen. More inferiorly in the lesser sac is a 8.7 x 6.3 cm organized cyst/pseudocyst which has significant mass effect on the body of the stomach. The pancreatic duct is not dilated. The splenic and portal veins are patent. Spleen: Upper limits of normal in size. No focal lesions. Small accessory spleen noted. Adrenals/Urinary Tract: Adrenal glands and kidneys are unremarkable and stable. Stomach/Bowel: There is significant mass effect on the stomach by the pseudocysts. There is also moderate inflammation involving the entire stomach including the GE junction. The duodenum is unremarkable. The small bowel and colon are unremarkable. Vascular/Lymphatic: The aorta and branch vessels are patent. The major venous structures are patent. Reproductive: The uterus and ovaries are surgically absent. Other: Moderate amount of free pelvic fluid, increased since the prior study. Musculoskeletal: No significant bony  findings. IMPRESSION: 1. Interval development of several organized cystic structures with thin rim of enhancement consistent with pseudocysts. Significant mass effect on the stomach by the pseudocysts. 2. Improved appearance of the pancreas. Regressing inflammatory changes and no  evidence of pancreatic necrosis. 3. Moderate inflammation involving the entire stomach including the GE junction. 4. Moderate amount of free pelvic fluid, increased since the prior study. 5. Status post cholecystectomy. No biliary dilatation. Electronically Signed   By: Marijo Sanes M.D.   On: 07/15/2020 20:16     LOS: 6 days   Antonieta Pert, MD [facility]  07/16/2020, 11:29 AM

## 2020-07-16 NOTE — Progress Notes (Signed)
   07/16/20 2230  Assess: MEWS Score  Temp 98.1 F (36.7 C)  BP 136/85  Pulse Rate (!) 124  ECG Heart Rate (!) 123  Resp 17  Level of Consciousness Alert  SpO2 100 %  O2 Device Room Air  Assess: MEWS Score  MEWS Temp 0  MEWS Systolic 0  MEWS Pulse 2  MEWS RR 0  MEWS LOC 0  MEWS Score 2  MEWS Score Color Yellow  Assess: if the MEWS score is Yellow or Red  Were vital signs taken at a resting state? Yes  Focused Assessment No change from prior assessment  Early Detection of Sepsis Score *See Row Information* Low  MEWS guidelines implemented *See Row Information* Yes  Take Vital Signs  Increase Vital Sign Frequency  Yellow: Q 2hr X 2 then Q 4hr X 2, if remains yellow, continue Q 4hrs  Escalate  MEWS: Escalate Yellow: discuss with charge nurse/RN and consider discussing with provider and RRT  Notify: Charge Nurse/RN  Name of Charge Nurse/RN Notified Celso,RN  Date Charge Nurse/RN Notified 07/16/20  Time Charge Nurse/RN Notified 2235  Notify: Provider  Provider Name/Title M. Katherina Right  Date Provider Notified 07/16/20  Time Provider Notified 2243  Notification Type Call  Notification Reason Change in status  Response Other (Comment) (continue pain control )  Date of Provider Response 07/16/20  Time of Provider Response 2243

## 2020-07-16 NOTE — Progress Notes (Signed)
Nutrition Follow-up  RD working remotely.  DOCUMENTATION CODES:   Not applicable  INTERVENTION:   -Continue Boost Breeze po TID, each supplement provides 250 kcal and 9 grams of protein -Continue MVI with minerals daily -30 ml Prosource Plus BID, each supplement provides 100 kcals and 15 grams -RD will follow for diet advancement and adjust supplement regimen as appropriate -If prolonged NPO/clear liquid diet status is anticipated, consider initiation of nutrition support  NUTRITION DIAGNOSIS:   Inadequate oral intake related to altered GI function as evidenced by NPO status.  Ongoing  GOAL:   Patient will meet greater than or equal to 90% of their needs  Progressing   MONITOR:   Diet advancement, PO intake, Supplement acceptance, Labs, Weight trends, Skin, I & O's  REASON FOR ASSESSMENT:   Malnutrition Screening Tool    ASSESSMENT:   Tricia Brown is a 44 y.o. female with medical history significant of endometriosis status post diagnostic laparoscopy and hysterectomy, IBS-D, GERD, status post cholecystectomy who presents with abdominal pain.  Reviewed I/O's: +300 ml x 24 hours and +4.6 L since admission  Per chart review, pt is tolerating clear liquids, but with continued nausea and diarrhea (PO: 85%). Pt is consuming Boost Breeze supplements.   Per GI notes, pseudocyst formation with mass likely causing nausea and vomiting and pancreatic parenchyma improving without necrosis. Plan to see if cyst gastrostomy is feasible.   Medications reviewed KCl.   Labs reviewed: K: 3.3.   Diet Order:   Diet Order            Diet clear liquid Room service appropriate? Yes; Fluid consistency: Thin  Diet effective now                 EDUCATION NEEDS:   Education needs have been addressed  Skin:  Skin Assessment: Reviewed RN Assessment  Last BM:  07/16/20  Height:   Ht Readings from Last 1 Encounters:  02/10/20 5\' 7"  (1.702 m)    Weight:   Wt Readings from  Last 1 Encounters:  02/10/20 63.5 kg    Ideal Body Weight:  61.4 kg  BMI:  There is no height or weight on file to calculate BMI.  Estimated Nutritional Needs:   Kcal:  1900-2100  Protein:  95-110 grams  Fluid:  > 1.9 L    02/12/20, RD, LDN, CDCES Registered Dietitian II Certified Diabetes Care and Education Specialist Please refer to [facility] for RD and/or RD on-call/weekend/after hours pager

## 2020-07-16 NOTE — Progress Notes (Signed)
Notified M. Katherina Right, NP of increased HR and "burning pain to abd and right arm", new orders to continue to medicate with prn meds,and notify again if hr stays in 120's without dropping down.

## 2020-07-17 DIAGNOSIS — K859 Acute pancreatitis without necrosis or infection, unspecified: Secondary | ICD-10-CM | POA: Diagnosis not present

## 2020-07-17 LAB — CBC
HCT: 28.6 % — ABNORMAL LOW (ref 36.0–46.0)
Hemoglobin: 9 g/dL — ABNORMAL LOW (ref 12.0–15.0)
MCH: 31.3 pg (ref 26.0–34.0)
MCHC: 31.5 g/dL (ref 30.0–36.0)
MCV: 99.3 fL (ref 80.0–100.0)
Platelets: 463 10*3/uL — ABNORMAL HIGH (ref 150–400)
RBC: 2.88 MIL/uL — ABNORMAL LOW (ref 3.87–5.11)
RDW: 12.9 % (ref 11.5–15.5)
WBC: 11.3 10*3/uL — ABNORMAL HIGH (ref 4.0–10.5)
nRBC: 0 % (ref 0.0–0.2)

## 2020-07-17 LAB — COMPREHENSIVE METABOLIC PANEL
ALT: 9 U/L (ref 0–44)
AST: 11 U/L — ABNORMAL LOW (ref 15–41)
Albumin: 2.2 g/dL — ABNORMAL LOW (ref 3.5–5.0)
Alkaline Phosphatase: 144 U/L — ABNORMAL HIGH (ref 38–126)
Anion gap: 10 (ref 5–15)
BUN: <5 mg/dL — ABNORMAL LOW (ref 6–20)
CO2: 29 mmol/L (ref 22–32)
Calcium: 8.6 mg/dL — ABNORMAL LOW (ref 8.9–10.3)
Chloride: 103 mmol/L (ref 98–111)
Creatinine, Ser: 0.67 mg/dL (ref 0.44–1.00)
GFR, Estimated: >60 mL/min (ref >60)
Glucose, Bld: 97 mg/dL (ref 70–99)
Potassium: 4 mmol/L (ref 3.5–5.1)
Sodium: 142 mmol/L (ref 135–145)
Total Bilirubin: 0.6 mg/dL (ref 0.3–1.2)
Total Protein: 5.3 g/dL — ABNORMAL LOW (ref 6.5–8.1)

## 2020-07-17 NOTE — Progress Notes (Signed)
[facility] Progress Note  Tricia Brown 44 y.o. Apr 06, 1976   Subjective: No vomiting yesterday or overnight. No vomiting since Monday. Tolerating small amounts of clear liquids before nausea worsens. Abdominal pain better controlled. Loose stools continuing. Sitting in bedside chair. Nurse present during my evaluation.   Objective: Vital signs: Vitals:   07/17/20 0628 07/17/20 1019  BP: (!) 145/91 133/89  Pulse: 96 88  Resp: 18 16  Temp: 98.3 F (36.8 C) 98.7 F (37.1 C)  SpO2: 100% 100%    Physical Exam: Gen: alert, no acute distress, thin HEENT: anicteric sclera CV: RRR Chest: CTA B Abd: diffusely tender, slightly less guarding, soft, mild distention, +BS Ext: no edema  Lab Results: Recent Labs    07/16/20 0043 07/17/20 0450  NA 139 142  K 3.3* 4.0  CL 99 103  CO2 29 29  GLUCOSE 87 97  BUN <5* <5*  CREATININE 0.63 0.67  CALCIUM 8.5* 8.6*   Recent Labs    07/16/20 0043 07/17/20 0450  AST 10* 11*  ALT 8 9  ALKPHOS 147* 144*  BILITOT 0.4 0.6  PROT 5.1* 5.3*  ALBUMIN 2.2* 2.2*   Recent Labs    07/16/20 0043 07/17/20 0450  WBC 11.4* 11.3*  HGB 9.4* 9.0*  HCT 29.2* 28.6*  MCV 98.6 99.3  PLT 458* 463*      Assessment/Plan: Acute complicated pancreatitis with pseudocyst formation largest being 8.7 cm with significant mass effect on gastric body. No vomiting in 48 hours and tolerating more clear liquid diet. If she does not significantly improve her clear liquid intake, then will need nasogastric Coretrak placed for improved nutrition. Dr. Meridee Score tentatively planning for cyst gastrostomy drainage Monday afternoon. Continue pain control. Doubt she will be able to be discharged prior to pseudocyst drainage. Continue supportive care. Will d/w again timing with Dr. Meridee Score today.   Tricia Brown 07/17/2020, 11:07 AM  Questions please call [phone] ID: Tricia Brown, female   DOB: [birth year], 44 y.o.   MRN: [id number]

## 2020-07-17 NOTE — Progress Notes (Signed)
PROGRESS NOTE    Tricia Brown  QGY:[id number] DOB: Apr 22, 1976 DOA: 07/10/2020 PCP: [name], MD   Chief Complaint  Patient presents with  . Abdominal Pain  . Chest Pain   Brief Narrative: 44 y.o. female with medical history significant of endometriosis status post diagnostic laparoscopy and hysterectomy, IBS-D, GERD, status post cholecystectomy presents with abdominal pain x4 days, epigastric and left upper quadrant with associated nausea, vomiting and diarrhea, pain rated 4 out of 10 and when flares of up to 8 out of 10 with certain movement and following meals, not improved by Tums and daily PPI at home.  Drinks alcohol about sixpack on occasion on weekends. In the ED vitals stable labs with hypokalemia 3.0 alk phos 230, leukocytosis 14 K lipase 151 troponin x2 - UA normal CT abdomen showed pancreatitis with fluid collection consistent with pseudocyst reported as "stranding noted around the pancreatic body and tail compatible with acute pancreatitis. Adjacent fluid collections, the largest 3.8 cm most compatible with acute fluid collections and developing pseudocysts. Secondary reactive inflammation of the adjacent gastric wall. Small to moderate free fluid in the pelvis" Patient was given IV fluids pain medication Zofran and admission was requested. GI was consulted.  Had MRCP no evidence of choledocholithiasis. Patient is  being treated for acute pancreatitis, possibly passed CBD stone. Patient was started on clear liquid diet, diet advanced to full liquid diet.  Patient started to complain of abdominal pain fullness and diarrhea. Being closely monitored by GI, underwent repeat CTA 10/25-that showed multiple pseudocyst pressing on the stomach.  Subjective:  Overnight patient tachycardic 100-1 20s.,  Afebrile. CBC CMP overall unchanged. Patient reports she feels feeling better today on new oral pain regiment. Feels somewhat swollen in the legs-IV fluids decreased to 100 ml/hr.   No vomiting, has mild nausea, and abd pain She reports she is spoke with Dr. Meridee Score last night her her cysts are not mature enough for drainage  Assessment & Plan:  Acute pancreatitis pseudocyst formation, possibly passed CBD stone but does drink alcohol but felt to be less likely the cause per GI.  MRCP negative for choledocholithiasis.  Triglyceride normal 71, has mild leukocytosis, renal function appears stable, slightly elevated ALP otherwise stable LFTs. Patient complained of abdominal pain some vomiting, diarrhea.  Repeat CT abdomen 10/25 shows multiple pseudocyst pressing on  stomach, GI following closely and discussed-pain supine creatinine parenchyma is improving without necrosis.  GI to evaluating to see if gastrostomy is an option. Cont on IV Dilaudid oxy, bentyl and toradol iv for pain control.  Continue aggressive IV fluid hydration  Loose stool intermittently, after starting oral diet likely in relation to #1.  Monitor continue hydration.   Hypokalemia resolved..  Hypoalbuminemia in the setting of acute pancreatitis augment diet as tolerated  Anemia of chronic disease hemoglobin ranging 9 to 11 g.  Monitor.  Mild metabolic acidosis/dehydration due to #1.  Resolved on IV fluids. Chronic back pain, controlled. Depression: Mood is stable. Leukocytosis likely in the setting of WC count improved but slightly uptrending.  Monitor.  Nutrition: Diet Order            Diet clear liquid Room service appropriate? Yes; Fluid consistency: Thin  Diet effective now                 There is no height or weight on file to calculate BMI. DVT prophylaxis: enoxaparin (LOVENOX) injection 40 mg Start: 07/11/20 1000 Code Status:   Code Status: Full Code  Family Communication: plan  of care discssed with patient at bedside.  Status is: Inpatient Remains inpatient appropriate because:IV treatments appropriate due to intensity of illness or inability to take PO, Inpatient level of care  appropriate due to severity of illness and Ongoing management of acute pancreatitis with pseudocyst  Dispo: The patient is from: Home              Anticipated d/c is to: Home.  Increase activity as tolerated.              Anticipated d/c date is:3 days              Patient currently is not medically stable to d/c.  Consultants:see note  Procedures:see note  MR I/MRCP abdomen 10/21 1. No significant change in findings consistent with acute interstitial edematous pancreatitis. No evidence of pancreatic hemorrhage or necrosis. 2. Stable peripherally enhancing fluid collections, primarily in the lesser sac, consistent with acute peripancreatic fluid collections. 3. No biliary dilatation post cholecystectomy. No evidence of choledocholithiasis. 4. Stable focal airspace disease peripherally at the right lung base, likely pneumonia or atelectasis  Culture/Microbiology    Component Value Date/Time   SDES URINE, CLEAN CATCH 05/12/2019 1507   SPECREQUEST  05/12/2019 1507    NONE Performed at [facility], [place] [address] [place], [place] [address]    CULT  05/12/2019 1507    Multiple bacterial morphotypes present, none predominant. Suggest appropriate recollection if clinically indicated.   REPTSTATUS 05/13/2019 FINAL 05/12/2019 1507    Other culture-see note  Medications: Scheduled Meds: . (feeding supplement) PROSource Plus  30 mL Oral BID WC  . enoxaparin (LOVENOX) injection  40 mg Subcutaneous Q24H  . estradiol  1 mg Oral Daily  . feeding supplement  1 Container Oral TID BM  . mometasone-formoterol  2 puff Inhalation BID  . multivitamin with minerals  1 tablet Oral Daily  . pantoprazole (PROTONIX) IV  40 mg Intravenous Q12H  . potassium chloride  20 mEq Oral Daily  . sodium chloride flush  3 mL Intravenous Q12H   Continuous Infusions: . lactated ringers 150 mL/hr at 07/17/20 2122    Antimicrobials: Anti-infectives (From admission, onward)   None      Objective: Vitals: Today's Vitals   07/17/20 0241 07/17/20 0517 07/17/20 0625 07/17/20 0628  BP:    (!) 145/91  Pulse:    96  Resp:    18  Temp:    98.3 F (36.8 C)  TempSrc:    Oral  SpO2:    100%  PainSc: $Remove'6  8  6      'gEVfmxv$ Intake/Output Summary (Last 24 hours) at 07/17/2020 4825 Last data filed at 07/16/2020 1233 Gross per 24 hour  Intake 10399.54 ml  Output --  Net 10399.54 ml   There were no vitals filed for this visit. Weight change:   Intake/Output from previous day: 10/26 0701 - 10/27 0700 In: 10399.5 [P.O.:240; I.V.:10159.5] Out: -  Intake/Output this shift: No intake/output data recorded.  Examination: General exam: AAOx3 , NAD, weak appearing. HEENT:Oral mucosa moist, Ear/Nose WNL grossly, dentition normal. Respiratory system: bilaterally clear,no wheezing or crackles,no use of accessory muscle Cardiovascular system: S1 & S2 +, No JVD,. Gastrointestinal system: Abdomen soft, diffuse tenderness mostly in the mid abdomen,ND, BS+ Nervous System:Alert, awake, moving extremities and grossly nonfocal Extremities: No edema, distal peripheral pulses palpable.  Skin: No rashes,no icterus. MSK: Normal muscle bulk,tone, power  Data Reviewed: I have personally reviewed following labs and imaging studies CBC: Recent Labs  Lab 07/10/20 1102 07/10/20 1424 07/12/20 0128 07/13/20 0313 07/14/20 0140 07/16/20 0043 07/17/20 0450  WBC 14.6*   < > 12.0* 10.3 9.7 11.4* 11.3*  NEUTROABS 11.1*  --   --   --   --   --   --   HGB 12.4   < > 11.1* 10.5* 9.7* 9.4* 9.0*  HCT 38.0   < > 34.0* 31.9* 29.9* 29.2* 28.6*  MCV 95.5   < > 98.6 96.7 97.4 98.6 99.3  PLT 681*   < > 531* 467* 465* 458* 463*   < > = values in this interval not displayed.   Basic Metabolic Panel: Recent Labs  Lab 07/10/20 2357 07/11/20 0628 07/12/20 0128 07/13/20 0313 07/14/20 0140 07/16/20 0043 07/17/20 0450  NA  --    < > 138 139 141 139 142  K  --    < > 3.7 2.9* 3.6 3.3* 4.0  CL  --    < >  107 106 109 99 103  CO2  --    < > 18* $Rem'22 24 29 29  'BxeM$ GLUCOSE  --    < > 77 84 104* 87 97  BUN  --    < > <5* <5* <5* <5* <5*  CREATININE  --    < > 0.85 0.67 0.60 0.63 0.67  CALCIUM  --    < > 8.3* 8.0* 8.2* 8.5* 8.6*  MG 1.9  --   --   --   --   --   --    < > = values in this interval not displayed.   GFR: CrCl cannot be calculated (Unknown ideal weight.). Liver Function Tests: Recent Labs  Lab 07/12/20 0128 07/13/20 0313 07/14/20 0140 07/16/20 0043 07/17/20 0450  AST 12* 8* 10* 10* 11*  ALT $Re'9 6 8 8 9  'iXd$ ALKPHOS 161* 132* 119 147* 144*  BILITOT 0.9 0.5 0.5 0.4 0.6  PROT 5.1* 5.0* 4.9* 5.1* 5.3*  ALBUMIN 2.3* 2.0* 2.0* 2.2* 2.2*   Recent Labs  Lab 07/10/20 1102 07/10/20 1424 07/13/20 0313  LIPASE 151* 128* 101*   No results for input(s): AMMONIA in the last 168 hours. Coagulation Profile: No results for input(s): INR, PROTIME in the last 168 hours. Cardiac Enzymes: No results for input(s): CKTOTAL, CKMB, CKMBINDEX, TROPONINI in the last 168 hours. BNP (last 3 results) No results for input(s): PROBNP in the last 8760 hours. HbA1C: No results for input(s): HGBA1C in the last 72 hours. CBG: No results for input(s): GLUCAP in the last 168 hours. Lipid Profile: No results for input(s): CHOL, HDL, LDLCALC, TRIG, CHOLHDL, LDLDIRECT in the last 72 hours. Thyroid Function Tests: No results for input(s): TSH, T4TOTAL, FREET4, T3FREE, THYROIDAB in the last 72 hours. Anemia Panel: No results for input(s): VITAMINB12, FOLATE, FERRITIN, TIBC, IRON, RETICCTPCT in the last 72 hours. Sepsis Labs: No results for input(s): PROCALCITON, LATICACIDVEN in the last 168 hours.  Recent Results (from the past 240 hour(s))  Respiratory Panel by RT PCR (Flu A&B, Covid) - Nasopharyngeal Swab     Status: None   Collection Time: 07/11/20  4:18 AM   Specimen: Nasopharyngeal Swab  Result Value Ref Range Status   SARS Coronavirus 2 by RT PCR NEGATIVE NEGATIVE Final    Comment:  (NOTE) SARS-CoV-2 target nucleic acids are NOT DETECTED.  The SARS-CoV-2 RNA is generally detectable in upper respiratoy specimens during the acute phase of infection. The lowest concentration of SARS-CoV-2 viral copies this assay can detect is 131  copies/mL. A negative result does not preclude SARS-Cov-2 infection and should not be used as the sole basis for treatment or other patient management decisions. A negative result may occur with  improper specimen collection/handling, submission of specimen other than nasopharyngeal swab, presence of viral mutation(s) within the areas targeted by this assay, and inadequate number of viral copies (<131 copies/mL). A negative result must be combined with clinical observations, patient history, and epidemiological information. The expected result is Negative.  Fact Sheet for Patients:  PinkCheek.be  Fact Sheet for Healthcare Providers:  GravelBags.it  This test is no t yet approved or cleared by the [place] FDA and  has been authorized for detection and/or diagnosis of SARS-CoV-2 by FDA under an Emergency Use Authorization (EUA). This EUA will remain  in effect (meaning this test can be used) for the duration of the COVID-19 declaration under Section 564(b)(1) of the Act, 21 U.S.C. section 360bbb-3(b)(1), unless the authorization is terminated or revoked sooner.     Influenza A by PCR NEGATIVE NEGATIVE Final   Influenza B by PCR NEGATIVE NEGATIVE Final    Comment: (NOTE) The Xpert Xpress SARS-CoV-2/FLU/RSV assay is intended as an aid in  the diagnosis of influenza from Nasopharyngeal swab specimens and  should not be used as a sole basis for treatment. Nasal washings and  aspirates are unacceptable for Xpert Xpress SARS-CoV-2/FLU/RSV  testing.  Fact Sheet for Patients: PinkCheek.be  Fact Sheet for Healthcare  Providers: GravelBags.it  This test is not yet approved or cleared by the [place] FDA and  has been authorized for detection and/or diagnosis of SARS-CoV-2 by  FDA under an Emergency Use Authorization (EUA). This EUA will remain  in effect (meaning this test can be used) for the duration of the  Covid-19 declaration under Section 564(b)(1) of the Act, 21  U.S.C. section 360bbb-3(b)(1), unless the authorization is  terminated or revoked. Performed at [facility], [place] [address] [place], [place] [address]      Radiology Studies: CT ABDOMEN PELVIS W WO CONTRAST  Result Date: 07/15/2020 CLINICAL DATA:  Follow-up pancreatitis. EXAM: CT ABDOMEN AND PELVIS WITHOUT AND WITH CONTRAST TECHNIQUE: Multidetector CT imaging of the abdomen and pelvis was performed following the standard protocol before and following the bolus administration of intravenous contrast. CONTRAST:  172mL OMNIPAQUE IOHEXOL 300 MG/ML  SOLN COMPARISON:  CT scan 07/10/2020 and MRI 07/11/2020 FINDINGS: Lower chest: The lung bases are clear of an acute process. No pleural effusions or pulmonary infiltrates. Hepatobiliary: No hepatic lesions or intrahepatic biliary dilatation. The portal and hepatic veins are patent. The gallbladder is surgically absent. No common bile duct dilatation. Pancreas: Mild residual inflammatory changes involving the pancreas. Interval organized slight rim enhancing fluid collections in and around the pancreas. There is a 12 mm lesion in the tail region the pancreas. There is a 14 mm cyst projecting off the ventral aspect of the pancreas on image 31/3. 2.8 cm cystic lesion appears to be in the posterior wall of the stomach and creates some mass effect on the lumen. This may communicate with a larger more complex fluid collection in the lesser sac and also likely in the posterior wall the stomach. This collection measures 4.5 cm. 4.7 cm organized cystic structure in the  lesser sac between the pancreas, the stomach and the spleen. More inferiorly in the lesser sac is a 8.7 x 6.3 cm organized cyst/pseudocyst which has significant mass effect on the body of the stomach. The pancreatic duct is not  dilated. The splenic and portal veins are patent. Spleen: Upper limits of normal in size. No focal lesions. Small accessory spleen noted. Adrenals/Urinary Tract: Adrenal glands and kidneys are unremarkable and stable. Stomach/Bowel: There is significant mass effect on the stomach by the pseudocysts. There is also moderate inflammation involving the entire stomach including the GE junction. The duodenum is unremarkable. The small bowel and colon are unremarkable. Vascular/Lymphatic: The aorta and branch vessels are patent. The major venous structures are patent. Reproductive: The uterus and ovaries are surgically absent. Other: Moderate amount of free pelvic fluid, increased since the prior study. Musculoskeletal: No significant bony findings. IMPRESSION: 1. Interval development of several organized cystic structures with thin rim of enhancement consistent with pseudocysts. Significant mass effect on the stomach by the pseudocysts. 2. Improved appearance of the pancreas. Regressing inflammatory changes and no evidence of pancreatic necrosis. 3. Moderate inflammation involving the entire stomach including the GE junction. 4. Moderate amount of free pelvic fluid, increased since the prior study. 5. Status post cholecystectomy. No biliary dilatation. Electronically Signed   By: Marijo Sanes M.D.   On: 07/15/2020 20:16     LOS: 7 days   Antonieta Pert, MD [facility]  07/17/2020, 8:33 AM

## 2020-07-17 NOTE — Plan of Care (Signed)

## 2020-07-18 ENCOUNTER — Inpatient Hospital Stay (HOSPITAL_COMMUNITY): Payer: Medicaid Other

## 2020-07-18 DIAGNOSIS — K859 Acute pancreatitis without necrosis or infection, unspecified: Secondary | ICD-10-CM | POA: Diagnosis not present

## 2020-07-18 LAB — CBC
HCT: 29.4 % — ABNORMAL LOW (ref 36.0–46.0)
Hemoglobin: 9.1 g/dL — ABNORMAL LOW (ref 12.0–15.0)
MCH: 30.8 pg (ref 26.0–34.0)
MCHC: 31 g/dL (ref 30.0–36.0)
MCV: 99.7 fL (ref 80.0–100.0)
Platelets: 495 10*3/uL — ABNORMAL HIGH (ref 150–400)
RBC: 2.95 MIL/uL — ABNORMAL LOW (ref 3.87–5.11)
RDW: 13 % (ref 11.5–15.5)
WBC: 9.8 10*3/uL (ref 4.0–10.5)
nRBC: 0 % (ref 0.0–0.2)

## 2020-07-18 LAB — COMPREHENSIVE METABOLIC PANEL WITH GFR
ALT: 8 U/L (ref 0–44)
AST: 12 U/L — ABNORMAL LOW (ref 15–41)
Albumin: 2.3 g/dL — ABNORMAL LOW (ref 3.5–5.0)
Alkaline Phosphatase: 150 U/L — ABNORMAL HIGH (ref 38–126)
Anion gap: 13 (ref 5–15)
BUN: <5 mg/dL — ABNORMAL LOW (ref 6–20)
CO2: 26 mmol/L (ref 22–32)
Calcium: 8.5 mg/dL — ABNORMAL LOW (ref 8.9–10.3)
Chloride: 102 mmol/L (ref 98–111)
Creatinine, Ser: 0.71 mg/dL (ref 0.44–1.00)
GFR, Estimated: >60 mL/min
Glucose, Bld: 97 mg/dL (ref 70–99)
Potassium: 3.2 mmol/L — ABNORMAL LOW (ref 3.5–5.1)
Sodium: 141 mmol/L (ref 135–145)
Total Bilirubin: 0.5 mg/dL (ref 0.3–1.2)
Total Protein: 5.6 g/dL — ABNORMAL LOW (ref 6.5–8.1)

## 2020-07-18 LAB — GLUCOSE, CAPILLARY: Glucose-Capillary: 95 mg/dL (ref 70–99)

## 2020-07-18 MED ORDER — DICYCLOMINE HCL 10 MG PO CAPS: 10.0000 mg | ORAL_CAPSULE | Freq: Four times a day (QID) | ORAL | Status: DC | PRN

## 2020-07-18 MED ORDER — LORAZEPAM 0.5 MG PO TABS
0.5000 mg | ORAL_TABLET | Freq: Once | ORAL | Status: AC | PRN
  Administered 2020-07-18: 0.5 mg via ORAL
  Filled 2020-07-18: qty 1

## 2020-07-18 MED ORDER — POTASSIUM CHLORIDE 20 MEQ PO PACK
20.0000 meq | PACK | Freq: Every day | ORAL | Status: DC
  Administered 2020-07-19 – 2020-07-23 (×5): 20 meq
  Filled 2020-07-18 (×5): qty 1

## 2020-07-18 MED ORDER — LOPERAMIDE HCL 1 MG/7.5ML PO SUSP: 2.0000 mg | ORAL | Status: DC | PRN

## 2020-07-18 MED ORDER — OXYCODONE HCL 5 MG PO TABS
10.0000 mg | ORAL_TABLET | ORAL | Status: DC | PRN
  Administered 2020-07-18 – 2020-07-23 (×21): 10 mg
  Filled 2020-07-18 (×22): qty 2

## 2020-07-18 MED ORDER — ONDANSETRON HCL 4 MG/2ML IJ SOLN
4.0000 mg | Freq: Four times a day (QID) | INTRAMUSCULAR | Status: DC | PRN
  Administered 2020-07-18 – 2020-07-23 (×9): 4 mg via INTRAVENOUS
  Filled 2020-07-18 (×11): qty 2

## 2020-07-18 MED ORDER — LIDOCAINE VISCOUS HCL 2 % MT SOLN
10.0000 mL | Freq: Once | OROMUCOSAL | Status: AC
  Administered 2020-07-18: 10 mL via OROMUCOSAL
  Filled 2020-07-18: qty 15

## 2020-07-18 MED ORDER — POTASSIUM CHLORIDE CRYS ER 20 MEQ PO TBCR
40.0000 meq | EXTENDED_RELEASE_TABLET | Freq: Once | ORAL | Status: AC
  Administered 2020-07-18: 40 meq via ORAL
  Filled 2020-07-18: qty 2

## 2020-07-18 MED ORDER — ONDANSETRON HCL 4 MG PO TABS
4.0000 mg | ORAL_TABLET | Freq: Four times a day (QID) | ORAL | Status: DC | PRN
  Administered 2020-07-19 – 2020-07-20 (×3): 4 mg
  Filled 2020-07-18 (×2): qty 1

## 2020-07-18 MED ORDER — FREE WATER
150.0000 mL | Status: DC
  Administered 2020-07-18 – 2020-07-23 (×23): 150 mL

## 2020-07-18 MED ORDER — IOHEXOL 300 MG/ML  SOLN
10.0000 mL | Freq: Once | INTRAMUSCULAR | Status: AC | PRN
  Administered 2020-07-18: 10 mL

## 2020-07-18 MED ORDER — ESTRADIOL 1 MG PO TABS
1.0000 mg | ORAL_TABLET | Freq: Every day | ORAL | Status: DC
  Administered 2020-07-19 – 2020-07-23 (×5): 1 mg
  Filled 2020-07-18 (×5): qty 1

## 2020-07-18 MED ORDER — ADULT MULTIVITAMIN W/MINERALS CH
1.0000 | ORAL_TABLET | Freq: Every day | ORAL | Status: DC
  Administered 2020-07-19 – 2020-07-23 (×5): 1
  Filled 2020-07-18 (×4): qty 1

## 2020-07-18 MED ORDER — OSMOLITE 1.2 CAL PO LIQD: 1000.0000 mL | ORAL | Status: DC

## 2020-07-18 MED ORDER — PROSOURCE TF PO LIQD
45.0000 mL | Freq: Every day | ORAL | Status: DC
  Administered 2020-07-19 – 2020-07-24 (×5): 45 mL
  Filled 2020-07-18 (×7): qty 45

## 2020-07-18 MED ORDER — VITAL 1.5 CAL PO LIQD
1000.0000 mL | ORAL | Status: DC
  Administered 2020-07-18 – 2020-07-21 (×3): 1000 mL
  Filled 2020-07-18 (×11): qty 1000

## 2020-07-18 MED ORDER — FUROSEMIDE 10 MG/ML IJ SOLN
20.0000 mg | Freq: Once | INTRAMUSCULAR | Status: AC
  Administered 2020-07-18: 20 mg via INTRAVENOUS
  Filled 2020-07-18: qty 2

## 2020-07-18 MED ORDER — DICYCLOMINE HCL 10 MG/5ML PO SOLN
10.0000 mg | Freq: Four times a day (QID) | ORAL | Status: DC | PRN
  Administered 2020-07-18 – 2020-07-22 (×6): 10 mg
  Filled 2020-07-18 (×9): qty 5

## 2020-07-18 MED ORDER — LOPERAMIDE HCL 1 MG/7.5ML PO SUSP
2.0000 mg | ORAL | Status: DC | PRN
  Administered 2020-07-22 – 2020-07-23 (×2): 2 mg
  Filled 2020-07-18 (×2): qty 15

## 2020-07-18 NOTE — Progress Notes (Signed)
Cortrak Tube Team Note:  Received page from Diagnostic Radiology requesting bridle placement  Small bore feeding tube located in left nare and bridle successfully inserted and tube clipped in place at 98 cm.   Romelle Starcher MS, RDN, LDN, CNSC Registered Dietitian III Clinical Nutrition RD Pager and On-Call Pager Number Located in [place]

## 2020-07-18 NOTE — Progress Notes (Signed)
PROGRESS NOTE    Tricia Brown  YGE:[id number] DOB: [birth year] DOA: 07/10/2020 PCP: [name], MD   Chief Complaint  Patient presents with  . Abdominal Pain  . Chest Pain   Brief Narrative: 44 y.o. female with medical history significant of endometriosis status post diagnostic laparoscopy and hysterectomy, IBS-D, GERD, status post cholecystectomy presents with abdominal pain x4 days, epigastric and left upper quadrant with associated nausea, vomiting and diarrhea, pain rated 4 out of 10 and when flares of up to 8 out of 10 with certain movement and following meals, not improved by Tums and daily PPI at home.  Drinks alcohol about sixpack on occasion on weekends. In the ED vitals stable labs with hypokalemia 3.0 alk phos 230, leukocytosis 14 K lipase 151 troponin x2 - UA normal CT abdomen showed pancreatitis with fluid collection consistent with pseudocyst reported as "stranding noted around the pancreatic body and tail compatible with acute pancreatitis. Adjacent fluid collections, the largest 3.8 cm most compatible with acute fluid collections and developing pseudocysts. Secondary reactive inflammation of the adjacent gastric wall. Small to moderate free fluid in the pelvis" Patient was given IV fluids pain medication Zofran and admission was requested. GI was consulted.  Had MRCP no evidence of choledocholithiasis. Patient is  being treated for acute pancreatitis, possibly passed CBD stone. Patient was started on clear liquid diet, diet advanced to full liquid diet.  Patient started to complain of abdominal pain fullness and diarrhea. Being closely monitored by GI, underwent repeat CTA 10/25-that showed multiple pseudocyst pressing on the stomach-largest being 8.7 cm with significant mass-effect on the gastric body.  Subjective: Reports she has been eating okay, no nausea vomiting. She complains of getting short of breath with activity and edema up to thigh Afebrile overnight. K  at 3.2  Assessment & Plan:  Acute pancreatitis pseudocyst formation largest 8.7 cm with significant mass-effect on the stomach: possibly passed CBD stone but does drink alcohol but felt to be less likely the cause per GI.  MRCP negative for choledocholithiasis.  Triglyceride normal 71, renal function appears stable, slightly elevated ALP otherwise stable LFTs. Repeat CT abdomen 10/25 shows multiple pseudocyst-seen by Dr. Meridee Score and tentatively planning for gastrostomy drainage Monday afternoon, continue diet as per GI.  If not tolerating well will need core track and tube feed per GI. Continue aggressive IV hydration pain management w/ IV Dilaudid oxy, bentyl and .  Fluid overload with leg edema and shortness of breath , likley due to aggressive hydration for pancreatitis.  Continue IV fluids-rate decreased, ordered iv Lasix  20 mg  x1.  Hypoalbuminemia  also contributing to edema  Loose stool intermittently, after starting oral diet likely in relation to #1.  Continue IV fluids  Hypokalemia we will replete again  Hypoalbuminemia in the setting of acute pancreatitis augment diet as tolerated  Anemia of chronic disease hemoglobin ranging 9 to 11 g.  Monitor.  Mild metabolic acidosis/dehydration due to #1.  Resolved. Chronic back pain, controlled, continue pain management Depression: Her mood is stable and cheerful. Leukocytosis likely in the setting of acute pancreatitis.  Resolved.  Nutrition: Diet Order            Diet NPO time specified  Diet effective now                 Body mass index is 21.93 kg/m. DVT prophylaxis: enoxaparin (LOVENOX) injection 40 mg Start: 07/11/20 1000 Code Status:   Code Status: Full Code  Family Communication: plan of care  discssed with patient at bedside.  Status is: Inpatient Remains inpatient appropriate because:IV treatments appropriate due to intensity of illness or inability to take PO, Inpatient level of care appropriate due to severity of  illness and Ongoing management of acute pancreatitis with pseudocyst  Dispo: The patient is from: Home              Anticipated d/c is to: Home.  Increase activity as tolerated.              Anticipated d/c date is:3 days              Patient currently is not medically stable to d/c.  Consultants:see note  Procedures:see note  MR I/MRCP abdomen 10/21 1. No significant change in findings consistent with acute interstitial edematous pancreatitis. No evidence of pancreatic hemorrhage or necrosis. 2. Stable peripherally enhancing fluid collections, primarily in the lesser sac, consistent with acute peripancreatic fluid collections. 3. No biliary dilatation post cholecystectomy. No evidence of choledocholithiasis. 4. Stable focal airspace disease peripherally at the right lung base, likely pneumonia or atelectasis  Culture/Microbiology    Component Value Date/Time   SDES URINE, CLEAN CATCH 05/12/2019 1507   SPECREQUEST  05/12/2019 1507    NONE Performed at [facility], [place] [address] [place], [place] [address]    CULT  05/12/2019 1507    Multiple bacterial morphotypes present, none predominant. Suggest appropriate recollection if clinically indicated.   REPTSTATUS 05/13/2019 FINAL 05/12/2019 1507    Other culture-see note  Medications: Scheduled Meds: . enoxaparin (LOVENOX) injection  40 mg Subcutaneous Q24H  . estradiol  1 mg Oral Daily  . [START ON 07/19/2020] feeding supplement (PROSource TF)  45 mL Per Tube Daily  . [START ON 07/19/2020] free water  150 mL Per Tube Q4H  . mometasone-formoterol  2 puff Inhalation BID  . multivitamin with minerals  1 tablet Oral Daily  . pantoprazole (PROTONIX) IV  40 mg Intravenous Q12H  . potassium chloride  20 mEq Oral Daily  . sodium chloride flush  3 mL Intravenous Q12H   Continuous Infusions: . feeding supplement (VITAL 1.5 CAL)    . lactated ringers 50 mL/hr at 07/18/20 1304    Antimicrobials: Anti-infectives (From  admission, onward)   None     Objective: Vitals: Today's Vitals   07/18/20 0515 07/18/20 0847 07/18/20 1135 07/18/20 1205  BP: 133/88     Pulse: (!) 101     Resp: 18     Temp: 98.1 F (36.7 C)     TempSrc: Oral     SpO2: 100% 98%    Weight:      Height:      PainSc:   9  8    No intake or output data in the 24 hours ending 07/18/20 1405 Filed Weights   07/17/20 1500  Weight: 63.5 kg   Weight change:   Intake/Output from previous day: 10/27 0701 - 10/28 0700 In: 3062.2 [I.V.:3062.2] Out: -  Intake/Output this shift: No intake/output data recorded.  Examination: General exam: AAOx3 NAD, weak appearing. HEENT:Oral mucosa moist, Ear/Nose WNL grossly, dentition normal. Respiratory system: bilaterally diminished breath sound,no wheezing or crackles,no use of accessory muscle Cardiovascular system: S1 & S2 +, No JVD,. Gastrointestinal system: Abdomen soft, NT,ND, BS+ Nervous System:Alert, awake, moving extremities and grossly nonfocal Extremities: Bilateral pitting ankle edema, distal peripheral pulses palpable.  Skin: No rashes,no icterus. MSK: Normal muscle bulk,tone, power  Data Reviewed: I have personally reviewed following labs and imaging studies  CBC: Recent Labs  Lab 07/13/20 0313 07/14/20 0140 07/16/20 0043 07/17/20 0450 07/18/20 0144  WBC 10.3 9.7 11.4* 11.3* 9.8  HGB 10.5* 9.7* 9.4* 9.0* 9.1*  HCT 31.9* 29.9* 29.2* 28.6* 29.4*  MCV 96.7 97.4 98.6 99.3 99.7  PLT 467* 465* 458* 463* 235*   Basic Metabolic Panel: Recent Labs  Lab 07/13/20 0313 07/14/20 0140 07/16/20 0043 07/17/20 0450 07/18/20 0144  NA 139 141 139 142 141  K 2.9* 3.6 3.3* 4.0 3.2*  CL 106 109 99 103 102  CO2 $Re'22 24 29 29 26  'Npd$ GLUCOSE 84 104* 87 97 97  BUN <5* <5* <5* <5* <5*  CREATININE 0.67 0.60 0.63 0.67 0.71  CALCIUM 8.0* 8.2* 8.5* 8.6* 8.5*   GFR: Estimated Creatinine Clearance: 87.3 mL/min (by C-G formula based on SCr of 0.71 mg/dL). Liver Function Tests: Recent Labs    Lab 07/13/20 0313 07/14/20 0140 07/16/20 0043 07/17/20 0450 07/18/20 0144  AST 8* 10* 10* 11* 12*  ALT $Re'6 8 8 9 8  'QIO$ ALKPHOS 132* 119 147* 144* 150*  BILITOT 0.5 0.5 0.4 0.6 0.5  PROT 5.0* 4.9* 5.1* 5.3* 5.6*  ALBUMIN 2.0* 2.0* 2.2* 2.2* 2.3*   Recent Labs  Lab 07/13/20 0313  LIPASE 101*   No results for input(s): AMMONIA in the last 168 hours. Coagulation Profile: No results for input(s): INR, PROTIME in the last 168 hours. Cardiac Enzymes: No results for input(s): CKTOTAL, CKMB, CKMBINDEX, TROPONINI in the last 168 hours. BNP (last 3 results) No results for input(s): PROBNP in the last 8760 hours. HbA1C: No results for input(s): HGBA1C in the last 72 hours. CBG: No results for input(s): GLUCAP in the last 168 hours. Lipid Profile: No results for input(s): CHOL, HDL, LDLCALC, TRIG, CHOLHDL, LDLDIRECT in the last 72 hours. Thyroid Function Tests: No results for input(s): TSH, T4TOTAL, FREET4, T3FREE, THYROIDAB in the last 72 hours. Anemia Panel: No results for input(s): VITAMINB12, FOLATE, FERRITIN, TIBC, IRON, RETICCTPCT in the last 72 hours. Sepsis Labs: No results for input(s): PROCALCITON, LATICACIDVEN in the last 168 hours.  Recent Results (from the past 240 hour(s))  Respiratory Panel by RT PCR (Flu A&B, Covid) - Nasopharyngeal Swab     Status: None   Collection Time: 07/11/20  4:18 AM   Specimen: Nasopharyngeal Swab  Result Value Ref Range Status   SARS Coronavirus 2 by RT PCR NEGATIVE NEGATIVE Final    Comment: (NOTE) SARS-CoV-2 target nucleic acids are NOT DETECTED.  The SARS-CoV-2 RNA is generally detectable in upper respiratoy specimens during the acute phase of infection. The lowest concentration of SARS-CoV-2 viral copies this assay can detect is 131 copies/mL. A negative result does not preclude SARS-Cov-2 infection and should not be used as the sole basis for treatment or other patient management decisions. A negative result may occur with  improper  specimen collection/handling, submission of specimen other than nasopharyngeal swab, presence of viral mutation(s) within the areas targeted by this assay, and inadequate number of viral copies (<131 copies/mL). A negative result must be combined with clinical observations, patient history, and epidemiological information. The expected result is Negative.  Fact Sheet for Patients:  PinkCheek.be  Fact Sheet for Healthcare Providers:  GravelBags.it  This test is no t yet approved or cleared by the [place] FDA and  has been authorized for detection and/or diagnosis of SARS-CoV-2 by FDA under an Emergency Use Authorization (EUA). This EUA will remain  in effect (meaning this test can be used) for the duration of the  COVID-19 declaration under Section 564(b)(1) of the Act, 21 U.S.C. section 360bbb-3(b)(1), unless the authorization is terminated or revoked sooner.     Influenza A by PCR NEGATIVE NEGATIVE Final   Influenza B by PCR NEGATIVE NEGATIVE Final    Comment: (NOTE) The Xpert Xpress SARS-CoV-2/FLU/RSV assay is intended as an aid in  the diagnosis of influenza from Nasopharyngeal swab specimens and  should not be used as a sole basis for treatment. Nasal washings and  aspirates are unacceptable for Xpert Xpress SARS-CoV-2/FLU/RSV  testing.  Fact Sheet for Patients: PinkCheek.be  Fact Sheet for Healthcare Providers: GravelBags.it  This test is not yet approved or cleared by the [place] FDA and  has been authorized for detection and/or diagnosis of SARS-CoV-2 by  FDA under an Emergency Use Authorization (EUA). This EUA will remain  in effect (meaning this test can be used) for the duration of the  Covid-19 declaration under Section 564(b)(1) of the Act, 21  U.S.C. section 360bbb-3(b)(1), unless the authorization is  terminated or revoked. Performed at  [facility], [place] [address] [place], [place] [address]      Radiology Studies: DG Abd 1 View  Result Date: 07/18/2020 CLINICAL DATA:  Feeding tube placement EXAM: ABDOMEN - 1 VIEW COMPARISON:  None. FLUOROSCOPY TIME:  0 minutes 42 seconds; 1 acquired image FINDINGS: Feeding tube tip is in the third portion of the duodenum. Contrast injection confirms placement. Visualized bowel gas pattern unremarkable. No bowel obstruction or free air evident on limited supine examination. IMPRESSION: Feeding tube tip in third portion of duodenum. Electronically Signed   By: Lowella Grip III M.D.   On: 07/18/2020 13:02     LOS: 8 days   Antonieta Pert, MD [facility]  07/18/2020, 2:05 PM

## 2020-07-18 NOTE — Progress Notes (Signed)
[facility] Progress Note  Tricia Brown 44 y.o. 04-01-1976  CC:  Acute pancreatitis with pseudocysts  Subjective: Patient reports continued abdominal pain, slightly improved but aggravated by oral intake or movement.  She is tolerating small amounts of clear liquids but states she does not think she will be able to advance diet past clear liquids.  Denies vomiting but notes some nausea, as well as reflux.     ROS : Review of Systems  Cardiovascular: Negative for chest pain and palpitations.  Gastrointestinal: Positive for abdominal pain, diarrhea, heartburn and nausea. Negative for blood in stool, constipation, melena and vomiting.    Objective: Vital signs in last 24 hours: Vitals:   07/18/20 0515 07/18/20 0847  BP: 133/88   Pulse: (!) 101   Resp: 18   Temp: 98.1 F (36.7 C)   SpO2: 100% 98%    Physical Exam:  General:  Alert, oriented, cooperative, no distress  Head:  Normocephalic, without obvious abnormality, atraumatic  Eyes:  Anicteric sclera, EOMs intact  Lungs:   Clear to auscultation bilaterally, respirations unlabored  Heart:  Mildly tachycardic with regular rhythm, S1, S2 normal  Abdomen:   Soft, mildly distended, diffuse moderate tenderness with guarding, bowel sounds active all four quadrants  Extremities: Bilateral lower extremity edema  Pulses: 2+ and symmetric    Lab Results: Recent Labs    07/17/20 0450 07/18/20 0144  NA 142 141  K 4.0 3.2*  CL 103 102  CO2 29 26  GLUCOSE 97 97  BUN <5* <5*  CREATININE 0.67 0.71  CALCIUM 8.6* 8.5*   Recent Labs    07/17/20 0450 07/18/20 0144  AST 11* 12*  ALT 9 8  ALKPHOS 144* 150*  BILITOT 0.6 0.5  PROT 5.3* 5.6*  ALBUMIN 2.2* 2.3*   Recent Labs    07/17/20 0450 07/18/20 0144  WBC 11.3* 9.8  HGB 9.0* 9.1*  HCT 28.6* 29.4*  MCV 99.3 99.7  PLT 463* 495*   No results for input(s): LABPROT, INR in the last 72 hours.    Assessment: Acute pancreatitis with pseudocyst formation.  Worsening pain with advancement to full liquid diet. Unclear etiology. Post-cholecystectomy, normal lipids. She denies frequent or heavy alcohol use.  ?passed de novo CBD stone vs. idiopathic -CT 10/25 revealed interval development of several pseudocysts. 8.7 x 6.3 cm organized cyst/pseudocyst which has significant mass effect on the body of the stomach.  Improved appearance of the pancreas. Regressing inflammatory changes and no evidence of pancreatic necrosis. -Normal renal function: BUN <5/ Cr 0.71 -WBCs now WNL 9.8 as compared to 11.3 yesterday -Triglycerides normal (71) as of 10/20 -Mildly elevated ALP, otherwise normal LFTs: T. Bili 0.5/ AST 12/ ALT 8/ ALP 150   Plan: Continue supportive care.   Agree with decreased IVF rate, as patient now has lower extremity edema, and pancreatitis was improved on most recent CT.    Order placed for IR-guided nasal jejunal Cortrak.  Order placed for nutritional consultation.  Plan for cyst gastrostomy on Monday 10/1 with Dr. Meridee Score.  [facility] will follow.  Edrick Kins PA-C 07/18/2020, 9:11 AM  Contact #  [phone]

## 2020-07-18 NOTE — Progress Notes (Signed)
Nutrition Follow-up  DOCUMENTATION CODES:   Not applicable  INTERVENTION:   -Continue MVI with minerals daily -D/c Prosource Plus -D/c Boost Breeze po TID, each supplement provides 250 kcal and 9 grams of protein -Initiate Vital 1.5 @ 25 ml/hr and increase by 10 ml every 4 hours to goal rate of 55 ml/hr.   45 ml Prosource TF daily.  150 ml free water flush every 4 hours    Tube feeding regimen provides 2020 kcal (100% of needs), 100 grams of protein, and 1008 ml of H2O. Total free water: 1908 ml daily  NUTRITION DIAGNOSIS:   Inadequate oral intake related to altered GI function as evidenced by NPO status.  Ongoing  GOAL:   Patient will meet greater than or equal to 90% of their needs  Progressing  MONITOR:   Diet advancement, PO intake, Supplement acceptance, Labs, Weight trends, Skin, I & O's  REASON FOR ASSESSMENT:   Malnutrition Screening Tool    ASSESSMENT:   Tricia Brown is a 44 y.o. female with medical history significant of endometriosis status post diagnostic laparoscopy and hysterectomy, IBS-D, GERD, status post cholecystectomy who presents with abdominal pain.  Reviewed I/O's: +3.1 L x 24 hours and +18.1 L since admission  Case discussed with RN, who confirms plan for feeding tube placement today. Per GI notes, plan for cyst gastrostomy on Monday, 07/22/20.  Spoke with pt and step-mother at bedside. Pt pleasant and in good spirits today. She reports continued abdominal pain and vomiting with PO intake. She is currently NPO. She is understandably a little anxious about feeding tube placement; RD discussed procedure and how pt will receive her nutrition until she is able to adequately tolerate PO intake. Pt very appreciative of RD visit and reports "I will do whatever is recommended so that I can get better".   RD will trial with semi-elemental formula (Vital 1.5) to promote tolerance. Vital products contain advanced blend of hydrolyzed protein, structured  lipid, and prebiotics; hydrolyzed, peptide-based protein system; and MCT/canola oil structured lipid to promote tolerance and absorption.   Medications reviewed and include KCl.   Labs reviewed: K: 3.2 (on PO supplementation).   Diet Order:   Diet Order            Diet NPO time specified  Diet effective now                 EDUCATION NEEDS:   Education needs have been addressed  Skin:  Skin Assessment: Reviewed RN Assessment  Last BM:  07/17/20  Height:   Ht Readings from Last 1 Encounters:  07/17/20 5\' 7"  (1.702 m)    Weight:   Wt Readings from Last 1 Encounters:  07/17/20 63.5 kg    Ideal Body Weight:  61.4 kg  BMI:  Body mass index is 21.93 kg/m.  Estimated Nutritional Needs:   Kcal:  1900-2100  Protein:  95-110 grams  Fluid:  > 1.9 L    07/19/20, RD, LDN, CDCES Registered Dietitian II Certified Diabetes Care and Education Specialist Please refer to [facility] for RD and/or RD on-call/weekend/after hours pager

## 2020-07-19 DIAGNOSIS — E876 Hypokalemia: Secondary | ICD-10-CM | POA: Diagnosis not present

## 2020-07-19 DIAGNOSIS — F333 Major depressive disorder, recurrent, severe with psychotic symptoms: Secondary | ICD-10-CM | POA: Diagnosis not present

## 2020-07-19 DIAGNOSIS — K859 Acute pancreatitis without necrosis or infection, unspecified: Secondary | ICD-10-CM | POA: Diagnosis not present

## 2020-07-19 LAB — CBC
HCT: 29.7 % — ABNORMAL LOW (ref 36.0–46.0)
Hemoglobin: 9.3 g/dL — ABNORMAL LOW (ref 12.0–15.0)
MCH: 31.1 pg (ref 26.0–34.0)
MCHC: 31.3 g/dL (ref 30.0–36.0)
MCV: 99.3 fL (ref 80.0–100.0)
Platelets: 506 10*3/uL — ABNORMAL HIGH (ref 150–400)
RBC: 2.99 MIL/uL — ABNORMAL LOW (ref 3.87–5.11)
RDW: 13.1 % (ref 11.5–15.5)
WBC: 9.5 10*3/uL (ref 4.0–10.5)
nRBC: 0 % (ref 0.0–0.2)

## 2020-07-19 LAB — BASIC METABOLIC PANEL
Anion gap: 12 (ref 5–15)
BUN: <5 mg/dL — ABNORMAL LOW (ref 6–20)
CO2: 29 mmol/L (ref 22–32)
Calcium: 8.4 mg/dL — ABNORMAL LOW (ref 8.9–10.3)
Chloride: 99 mmol/L (ref 98–111)
Creatinine, Ser: 0.72 mg/dL (ref 0.44–1.00)
GFR, Estimated: >60 mL/min (ref >60)
Glucose, Bld: 107 mg/dL — ABNORMAL HIGH (ref 70–99)
Potassium: 4 mmol/L (ref 3.5–5.1)
Sodium: 140 mmol/L (ref 135–145)

## 2020-07-19 LAB — GLUCOSE, CAPILLARY
Glucose-Capillary: 89 mg/dL (ref 70–99)
Glucose-Capillary: 90 mg/dL (ref 70–99)
Glucose-Capillary: 86 mg/dL (ref 70–99)
Glucose-Capillary: 84 mg/dL (ref 70–99)

## 2020-07-19 MED ORDER — PHENOL 1.4 % MT LIQD
1.0000 | OROMUCOSAL | Status: DC | PRN
  Administered 2020-07-20: 1 via OROMUCOSAL
  Filled 2020-07-19 (×3): qty 177

## 2020-07-19 NOTE — Progress Notes (Signed)
[facility] Progress Note  Crystalyn Delia 44 y.o. 07-Jun-1976  CC:  Acute pancreatitis with pseudocysts  Subjective: Patient reports continued abdominal pain.  Feeding tube was placed by IR yesterday.  She notes some throat irritation from the tube.  Has mild nausea but denies vomiting. Has been having an increased amount of liquid stools since initiating tube feeds.  ROS : Review of Systems  Cardiovascular: Negative for chest pain and palpitations.  Gastrointestinal: Positive for abdominal pain, diarrhea, heartburn and nausea. Negative for blood in stool, constipation, melena and vomiting.    Objective: Vital signs in last 24 hours: Vitals:   07/18/20 2018 07/19/20 0821  BP: 127/77   Pulse: (!) 104   Resp: 17   Temp: 98.3 F (36.8 C)   SpO2: 93% 98%    Physical Exam:  General:  Alert, oriented, cooperative, no distress  Head:  Normocephalic, without obvious abnormality, atraumatic  Eyes:  Anicteric sclera, EOMs intact  Lungs:   Clear to auscultation bilaterally, respirations unlabored  Heart:  Mildly tachycardic with regular rhythm, S1, S2 normal  Abdomen:   Soft, mildly distended, diffuse moderate tenderness with guarding, bowel sounds active all four quadrants  Extremities: Without edema  Pulses: 2+ and symmetric    Lab Results: Recent Labs    07/18/20 0144 07/19/20 0358  NA 141 140  K 3.2* 4.0  CL 102 99  CO2 26 29  GLUCOSE 97 107*  BUN <5* <5*  CREATININE 0.71 0.72  CALCIUM 8.5* 8.4*   Recent Labs    07/17/20 0450 07/18/20 0144  AST 11* 12*  ALT 9 8  ALKPHOS 144* 150*  BILITOT 0.6 0.5  PROT 5.3* 5.6*  ALBUMIN 2.2* 2.3*   Recent Labs    07/18/20 0144 07/19/20 0358  WBC 9.8 9.5  HGB 9.1* 9.3*  HCT 29.4* 29.7*  MCV 99.7 99.3  PLT 495* 506*   No results for input(s): LABPROT, INR in the last 72 hours.    Assessment: Acute pancreatitis with pseudocyst formation.  -CT 10/25 revealed interval development of several pseudocysts. 8.7  x 6.3 cm organized cyst/pseudocyst which has significant mass effect on the body of the stomach.  Improved appearance of the pancreas. Regressing inflammatory changes and no evidence of pancreatic necrosis. -Normal renal function: BUN <5/ Cr 0.72 -WBCs remain WNL (9.5)  -Triglycerides normal (71) as of 10/20 -Mildly elevated ALP, otherwise normal LFTs as of 10/28: T. Bili 0.5/ AST 12/ ALT 8/ ALP 150   Plan: Continue supportive care and pain control.  Repeat abd/pelvis CT with IV and oral contrast (if can tolerate oral contrast) on Sunday AM October 31st, ordered.  Plan for cyst gastrostomy on Monday 11/1 with Dr. Meridee Score.  [facility] will follow.  Edrick Kins PA-C 07/19/2020, 9:33 AM  Contact #  [phone]

## 2020-07-19 NOTE — Progress Notes (Signed)
PROGRESS NOTE    Tricia Brown  NUU:[id number] DOB: 10/08/1975 DOA: 07/10/2020 PCP: Lorelee Market, MD   Brief Narrative:  44 y.o.femalewith medical history significant ofendometriosis status post diagnostic laparoscopy and hysterectomy, IBS-D, GERD, status post cholecystectomy presents with abdominal pain x4 days, epigastric and left upper quadrant with associated nausea, vomiting and diarrhea, pain rated 4 out of 10 and when flares of up to 8 out of 10 with certain movement and following meals, not improved by Tums and daily PPI at home.  Drinks alcohol about sixpack on occasion on weekends. In the ED vitals stable labs with hypokalemia 3.0 alk phos 230, leukocytosis 14 K lipase 151 troponin x2 - UA normal CT abdomen showed pancreatitis with fluid collection consistent with pseudocyst reported as "stranding noted around the pancreatic body and tail compatible with acute pancreatitis. Adjacent fluid collections, the largest 3.8 cm most compatible with acute fluid collections and developing pseudocysts. Secondary reactive inflammation of the adjacent gastric wall. Small to moderate free fluid in the pelvis" Patient was given IV fluids pain medication Zofran and admission was requested. GI was consulted.  Had MRCP no evidence of choledocholithiasis. Patient is  being treated for acute pancreatitis, possibly passed CBD stone. Patient was started on clear liquid diet, diet advanced to full liquid diet.  Patient started to complain of abdominal pain fullness and diarrhea. Being closely monitored by GI, underwent repeat CTA 10/25-that showed multiple pseudocyst pressing on the stomach-largest being 8.7 cm with significant mass-effect on the gastric body  Assessment & Plan:  Acute pancreatitis pseudocyst formation largest 8.7 cm with significant mass-effect on the stomach: -  MRCP negative for choledocholithiasis.  Triglyceride normal 71, renal function appears stable, slightly elevated ALP  otherwise stable LFTs. - Repeat CT abdomen 10/25 shows multiple pseudocyst -Continue aggressive IV hydration and pain management w/ IV Dilaudid oxy, bentyl -Has not tolerated much clear liquid therefore coretrak nasojejunal tube inserted and tube feed has been started yesterday -Repeat CT scan abdomen/pelvis on 07/21/2020, plan is for cyst gastrostomy by Dr. Rush Landmark on 07/22/2020 -Keep her n.p.o. -Appreciate GI help  Fluid overload with leg edema and shortness of breath: Improved after IV Lasix yesterday. -Continue IV hydration and monitor signs of fluid overload  Loose stool intermittently: Continue IV fluids.    Hypokalemia: Resolved  Hypoalbuminemia in the setting of acute pancreatitis  Anemia of chronic disease hemoglobin ranging 9 to 11 g.  Monitor. -No signs of active bleeding.  Monitor H&H closely.  Thrombocytosis: Platelet: 495 -Likely reactive.  Continue to monitor  FEN: -On coretrak tube feedings as per GI  Mild metabolic acidosis/dehydration due to #1.  Resolved. Chronic back pain, controlled, continue pain management Depression: Her mood is stable and cheerful. Leukocytosis : Resolved  DVT prophylaxis: Lovenox/SCD Code Status: Full code Family Communication:  None present at bedside.  Plan of care discussed with patient in length and he verbalized understanding and agreed with it. Disposition Plan: Home after the procedure  Consultants:   GI  Procedures:   As above  Antimicrobials:   None  Status is: Inpatient   Dispo: The patient is from: Home              Anticipated d/c is to: Home              Anticipated d/c date is: 07/24/2020              Patient currently not medically stable for the discharge         Subjective:  Patient seen and examined.  Sitting comfortably on the bed.  Tells me that she is feeling better and has 6 out of 10 epigastric pain, continues to have nausea and loose stool however denies hematemesis or  melena.  Objective: Vitals:   07/18/20 0515 07/18/20 0847 07/18/20 2018 07/19/20 0821  BP: 133/88  127/77   Pulse: (!) 101  (!) 104   Resp: 18  17   Temp: 98.1 F (36.7 C)  98.3 F (36.8 C)   TempSrc: Oral  Axillary   SpO2: 100% 98% 93% 98%  Weight:      Height:        Intake/Output Summary (Last 24 hours) at 07/19/2020 1432 Last data filed at 07/18/2020 1639 Gross per 24 hour  Intake 0 ml  Output --  Net 0 ml   Filed Weights   07/17/20 1500  Weight: 63.5 kg    Examination:  General exam: Appears calm and comfortable  Respiratory system: Clear to auscultation. Respiratory effort normal. Cardiovascular system: S1 & S2 heard, RRR. No JVD, murmurs, rubs, gallops or clicks. No pedal edema. Gastrointestinal system: Soft, epigastric tenderness positive, no guarding, no rigidity.  Bowel sounds positive Central nervous system: Alert and oriented. No focal neurological deficits. Extremities: Symmetric 5 x 5 power. Skin: No rashes, lesions or ulcers Psychiatry: Judgement and insight appear normal. Mood & affect appropriate.    Data Reviewed: I have personally reviewed following labs and imaging studies  CBC: Recent Labs  Lab 07/14/20 0140 07/16/20 0043 07/17/20 0450 07/18/20 0144 07/19/20 0358  WBC 9.7 11.4* 11.3* 9.8 9.5  HGB 9.7* 9.4* 9.0* 9.1* 9.3*  HCT 29.9* 29.2* 28.6* 29.4* 29.7*  MCV 97.4 98.6 99.3 99.7 99.3  PLT 465* 458* 463* 495* 503*   Basic Metabolic Panel: Recent Labs  Lab 07/14/20 0140 07/16/20 0043 07/17/20 0450 07/18/20 0144 07/19/20 0358  NA 141 139 142 141 140  K 3.6 3.3* 4.0 3.2* 4.0  CL 109 99 103 102 99  CO2 $Re'24 29 29 26 29  'bcX$ GLUCOSE 104* 87 97 97 107*  BUN <5* <5* <5* <5* <5*  CREATININE 0.60 0.63 0.67 0.71 0.72  CALCIUM 8.2* 8.5* 8.6* 8.5* 8.4*   GFR: Estimated Creatinine Clearance: 87.3 mL/min (by C-G formula based on SCr of 0.72 mg/dL). Liver Function Tests: Recent Labs  Lab 07/13/20 0313 07/14/20 0140 07/16/20 0043  07/17/20 0450 07/18/20 0144  AST 8* 10* 10* 11* 12*  ALT $Re'6 8 8 9 8  'nrC$ ALKPHOS 132* 119 147* 144* 150*  BILITOT 0.5 0.5 0.4 0.6 0.5  PROT 5.0* 4.9* 5.1* 5.3* 5.6*  ALBUMIN 2.0* 2.0* 2.2* 2.2* 2.3*   Recent Labs  Lab 07/13/20 0313  LIPASE 101*   No results for input(s): AMMONIA in the last 168 hours. Coagulation Profile: No results for input(s): INR, PROTIME in the last 168 hours. Cardiac Enzymes: No results for input(s): CKTOTAL, CKMB, CKMBINDEX, TROPONINI in the last 168 hours. BNP (last 3 results) No results for input(s): PROBNP in the last 8760 hours. HbA1C: No results for input(s): HGBA1C in the last 72 hours. CBG: Recent Labs  Lab 07/18/20 2357 07/19/20 0733 07/19/20 1130  GLUCAP 95 89 90   Lipid Profile: No results for input(s): CHOL, HDL, LDLCALC, TRIG, CHOLHDL, LDLDIRECT in the last 72 hours. Thyroid Function Tests: No results for input(s): TSH, T4TOTAL, FREET4, T3FREE, THYROIDAB in the last 72 hours. Anemia Panel: No results for input(s): VITAMINB12, FOLATE, FERRITIN, TIBC, IRON, RETICCTPCT in the last 72 hours. Sepsis Labs: No  results for input(s): PROCALCITON, LATICACIDVEN in the last 168 hours.  Recent Results (from the past 240 hour(s))  Respiratory Panel by RT PCR (Flu A&B, Covid) - Nasopharyngeal Swab     Status: None   Collection Time: 07/11/20  4:18 AM   Specimen: Nasopharyngeal Swab  Result Value Ref Range Status   SARS Coronavirus 2 by RT PCR NEGATIVE NEGATIVE Final    Comment: (NOTE) SARS-CoV-2 target nucleic acids are NOT DETECTED.  The SARS-CoV-2 RNA is generally detectable in upper respiratoy specimens during the acute phase of infection. The lowest concentration of SARS-CoV-2 viral copies this assay can detect is 131 copies/mL. A negative result does not preclude SARS-Cov-2 infection and should not be used as the sole basis for treatment or other patient management decisions. A negative result may occur with  improper specimen  collection/handling, submission of specimen other than nasopharyngeal swab, presence of viral mutation(s) within the areas targeted by this assay, and inadequate number of viral copies (<131 copies/mL). A negative result must be combined with clinical observations, patient history, and epidemiological information. The expected result is Negative.  Fact Sheet for Patients:  PinkCheek.be  Fact Sheet for Healthcare Providers:  GravelBags.it  This test is no t yet approved or cleared by the [place] FDA and  has been authorized for detection and/or diagnosis of SARS-CoV-2 by FDA under an Emergency Use Authorization (EUA). This EUA will remain  in effect (meaning this test can be used) for the duration of the COVID-19 declaration under Section 564(b)(1) of the Act, 21 U.S.C. section 360bbb-3(b)(1), unless the authorization is terminated or revoked sooner.     Influenza A by PCR NEGATIVE NEGATIVE Final   Influenza B by PCR NEGATIVE NEGATIVE Final    Comment: (NOTE) The Xpert Xpress SARS-CoV-2/FLU/RSV assay is intended as an aid in  the diagnosis of influenza from Nasopharyngeal swab specimens and  should not be used as a sole basis for treatment. Nasal washings and  aspirates are unacceptable for Xpert Xpress SARS-CoV-2/FLU/RSV  testing.  Fact Sheet for Patients: PinkCheek.be  Fact Sheet for Healthcare Providers: GravelBags.it  This test is not yet approved or cleared by the [place] FDA and  has been authorized for detection and/or diagnosis of SARS-CoV-2 by  FDA under an Emergency Use Authorization (EUA). This EUA will remain  in effect (meaning this test can be used) for the duration of the  Covid-19 declaration under Section 564(b)(1) of the Act, 21  U.S.C. section 360bbb-3(b)(1), unless the authorization is  terminated or revoked. Performed at [facility], [address] [place], [place] [address]       Radiology Studies: DG Abd 1 View  Result Date: 07/18/2020 CLINICAL DATA:  Feeding tube placement EXAM: ABDOMEN - 1 VIEW COMPARISON:  None. FLUOROSCOPY TIME:  0 minutes 42 seconds; 1 acquired image FINDINGS: Feeding tube tip is in the third portion of the duodenum. Contrast injection confirms placement. Visualized bowel gas pattern unremarkable. No bowel obstruction or free air evident on limited supine examination. IMPRESSION: Feeding tube tip in third portion of duodenum. Electronically Signed   By: Lowella Grip III M.D.   On: 07/18/2020 13:02    Scheduled Meds: . enoxaparin (LOVENOX) injection  40 mg Subcutaneous Q24H  . estradiol  1 mg Per Tube Daily  . feeding supplement (PROSource TF)  45 mL Per Tube Daily  . free water  150 mL Per Tube Q4H  . mometasone-formoterol  2 puff Inhalation BID  . multivitamin with  minerals  1 tablet Per Tube Daily  . pantoprazole (PROTONIX) IV  40 mg Intravenous Q12H  . potassium chloride  20 mEq Per Tube Daily  . sodium chloride flush  3 mL Intravenous Q12H   Continuous Infusions: . feeding supplement (VITAL 1.5 CAL) 1,000 mL (07/18/20 1639)  . lactated ringers 50 mL/hr at 07/19/20 0039     LOS: 9 days   Time spent: 35 minutes   Alfie Alderfer Loann Quill, MD [facility]  If 7PM-7AM, please contact night-coverage [email or web address] 07/19/2020, 2:32 PM

## 2020-07-19 NOTE — Progress Notes (Signed)
Nutrition Follow-up  DOCUMENTATION CODES:   Not applicable  INTERVENTION:   -Continue Vital 1.5 @ 55 ml/hr via post-pyloric feeding tube   45 ml Prosource TF daily.  150 ml free water flush every 4 hours    Tube feeding regimen provides 2020 kcal (100% of needs), 100 grams of protein, and 1008 ml of H2O. Total free water: 1908 ml daily  NUTRITION DIAGNOSIS:   Inadequate oral intake related to altered GI function as evidenced by NPO status.  Ongoing  GOAL:   Patient will meet greater than or equal to 90% of their needs  Met with TF  MONITOR:   Diet advancement, PO intake, Supplement acceptance, Labs, Weight trends, Skin, I & O's  REASON FOR ASSESSMENT:   Malnutrition Screening Tool    ASSESSMENT:   Tricia Brown is a 44 y.o. female with medical history significant of endometriosis status post diagnostic laparoscopy and hysterectomy, IBS-D, GERD, status post cholecystectomy who presents with abdominal pain.  10/29- post-pyloric feeding tube placed, bridled  Reviewed I/O's: +2.1 L x 24 hours and +20.2 L since admission  Per GI notes, plan for cyst gastrostomy on Monday, 07/22/20.  Case discussed with RN; pt continues with NPO orders, but taking sips of water and ice chips. Per RN, pt tolerating TF well at goal rate of 55 ml/hr.   RD continue with semi-elemental formula (Vital 1.5) to promote tolerance. Vital products contain advanced blend of hydrolyzed protein, structured lipid, and prebiotics; hydrolyzed, peptide-based protein system; andMCT/canola oil structured lipidto promote tolerance and absorption.  Labs reviewed: CBGS: 89-90.   Diet Order:   Diet Order            Diet NPO time specified  Diet effective now                 EDUCATION NEEDS:   Education needs have been addressed  Skin:  Skin Assessment: Reviewed RN Assessment  Last BM:  07/18/20  Height:   Ht Readings from Last 1 Encounters:  07/17/20 $RemoveB'5\' 7"'YUWiuDPw$  (1.702 m)    Weight:    Wt Readings from Last 1 Encounters:  07/17/20 63.5 kg    Ideal Body Weight:  61.4 kg  BMI:  Body mass index is 21.93 kg/m.  Estimated Nutritional Needs:   Kcal:  1900-2100  Protein:  95-110 grams  Fluid:  > 1.9 L    Loistine Chance, RD, LDN, [place] Registered Dietitian II Certified Diabetes Care and Education Specialist Please refer to [facility] for RD and/or RD on-call/weekend/after hours pager

## 2020-07-20 DIAGNOSIS — F1120 Opioid dependence, uncomplicated: Secondary | ICD-10-CM | POA: Diagnosis not present

## 2020-07-20 DIAGNOSIS — E876 Hypokalemia: Secondary | ICD-10-CM | POA: Diagnosis not present

## 2020-07-20 DIAGNOSIS — F333 Major depressive disorder, recurrent, severe with psychotic symptoms: Secondary | ICD-10-CM | POA: Diagnosis not present

## 2020-07-20 DIAGNOSIS — K859 Acute pancreatitis without necrosis or infection, unspecified: Secondary | ICD-10-CM | POA: Diagnosis not present

## 2020-07-20 LAB — GLUCOSE, CAPILLARY
Glucose-Capillary: 106 mg/dL — ABNORMAL HIGH (ref 70–99)
Glucose-Capillary: 93 mg/dL (ref 70–99)
Glucose-Capillary: 103 mg/dL — ABNORMAL HIGH (ref 70–99)
Glucose-Capillary: 111 mg/dL — ABNORMAL HIGH (ref 70–99)
Glucose-Capillary: 114 mg/dL — ABNORMAL HIGH (ref 70–99)

## 2020-07-20 LAB — BASIC METABOLIC PANEL
Anion gap: 10 (ref 5–15)
BUN: <5 mg/dL — ABNORMAL LOW (ref 6–20)
CO2: 30 mmol/L (ref 22–32)
Calcium: 9 mg/dL (ref 8.9–10.3)
Chloride: 101 mmol/L (ref 98–111)
Creatinine, Ser: 0.49 mg/dL (ref 0.44–1.00)
GFR, Estimated: >60 mL/min (ref >60)
Glucose, Bld: 97 mg/dL (ref 70–99)
Potassium: 4 mmol/L (ref 3.5–5.1)
Sodium: 141 mmol/L (ref 135–145)

## 2020-07-20 LAB — CBC
HCT: 29.6 % — ABNORMAL LOW (ref 36.0–46.0)
Hemoglobin: 9.3 g/dL — ABNORMAL LOW (ref 12.0–15.0)
MCH: 31.1 pg (ref 26.0–34.0)
MCHC: 31.4 g/dL (ref 30.0–36.0)
MCV: 99 fL (ref 80.0–100.0)
Platelets: 527 10*3/uL — ABNORMAL HIGH (ref 150–400)
RBC: 2.99 MIL/uL — ABNORMAL LOW (ref 3.87–5.11)
RDW: 13 % (ref 11.5–15.5)
WBC: 9.9 10*3/uL (ref 4.0–10.5)
nRBC: 0 % (ref 0.0–0.2)

## 2020-07-20 NOTE — Progress Notes (Signed)
PROGRESS NOTE    Tricia Brown  FIE:[id number] DOB: 05/08/1976 DOA: 07/10/2020 PCP: Lorelee Market, MD   Brief Narrative:  44 y.o.femalewith medical history significant ofendometriosis status post diagnostic laparoscopy and hysterectomy, IBS-D, GERD, status post cholecystectomy presents with abdominal pain x4 days, epigastric and left upper quadrant with associated nausea, vomiting and diarrhea, pain rated 4 out of 10 and when flares of up to 8 out of 10 with certain movement and following meals, not improved by Tums and daily PPI at home.  Drinks alcohol about sixpack on occasion on weekends. In the ED vitals stable labs with hypokalemia 3.0 alk phos 230, leukocytosis 14 K lipase 151 troponin x2 - UA normal CT abdomen showed pancreatitis with fluid collection consistent with pseudocyst reported as "stranding noted around the pancreatic body and tail compatible with acute pancreatitis. Adjacent fluid collections, the largest 3.8 cm most compatible with acute fluid collections and developing pseudocysts. Secondary reactive inflammation of the adjacent gastric wall. Small to moderate free fluid in the pelvis" Patient was given IV fluids pain medication Zofran and admission was requested. GI was consulted.  Had MRCP no evidence of choledocholithiasis. Patient is  being treated for acute pancreatitis, possibly passed CBD stone. Patient was started on clear liquid diet, diet advanced to full liquid diet.  Patient started to complain of abdominal pain fullness and diarrhea. Being closely monitored by GI, underwent repeat CTA 10/25-that showed multiple pseudocyst pressing on the stomach-largest being 8.7 cm with significant mass-effect on the gastric body  Assessment & Plan:  Acute pancreatitis pseudocyst formation largest 8.7 cm with significant mass-effect on the stomach: - MRCP negative for choledocholithiasis.  Triglyceride normal 71, renal function appears stable, slightly elevated ALP  otherwise stable LFTs. - Repeat CT abdomen 10/25 shows multiple pseudocyst -Continue aggressive IV hydration and pain management w/ IV Dilaudid oxy, bentyl -Has not tolerated much clear liquid therefore coretrak nasojejunal tube inserted and tube feed has been started. -Repeat CT scan abdomen/pelvis on 07/21/2020, plan is for cyst gastrostomy by Dr. Rush Landmark on 07/22/2020 -Appreciate GI help  Fluid overload with leg edema and shortness of breath: Improved after IV Lasix -Continue IV hydration and monitor signs of fluid overload  Loose stool intermittently: Continue IV fluids.    Hypokalemia: Resolved  Hypoalbuminemia in the setting of acute pancreatitis  Anemia of chronic disease hemoglobin ranging 9 to 11 g.  Monitor. -No signs of active bleeding.  Monitor H&H closely.  Thrombocytosis: Platelet: 527 -Likely reactive.  Continue to monitor  FEN: -On coretrak tube feedings as per GI  Mild metabolic acidosis/dehydration due to #1.  Resolved. Chronic back pain, controlled, continue pain management Depression: Her mood is stable and cheerful. Leukocytosis : Resolved  DVT prophylaxis: Lovenox/SCD Code Status: Full code Family Communication:  None present at bedside.  Plan of care discussed with patient in length and he verbalized understanding and agreed with it. Disposition Plan: Home after the procedure  Consultants:   GI  Procedures:   As above  Antimicrobials:   None  Status is: Inpatient   Dispo: The patient is from: Home              Anticipated d/c is to: Home              Anticipated d/c date is: 07/24/2020              Patient currently not medically stable for the discharge         Subjective: Patient seen and examined.  Continues  to have nausea and epigastric pain.  Tolerating NG tube feeding very well.   objective: Vitals:   07/19/20 2020 07/20/20 0440 07/20/20 0736 07/20/20 1325  BP: (!) 144/83 130/74  121/71  Pulse: 100 91  93  Resp: 17  16    Temp: 98.3 F (36.8 C) 97.7 F (36.5 C)    TempSrc: Axillary Oral    SpO2: 98% 100% 98% 98%  Weight:      Height:        Intake/Output Summary (Last 24 hours) at 07/20/2020 1420 Last data filed at 07/20/2020 1209 Gross per 24 hour  Intake 120 ml  Output --  Net 120 ml   Filed Weights   07/17/20 1500  Weight: 63.5 kg    Examination: General exam: Appears calm and comfortable Respiratory system: Clear to auscultation. Respiratory effort normal. Cardiovascular system: S1 & S2 heard, RRR. No JVD, murmurs, rubs, gallops or clicks. No pedal edema. Gastrointestinal system: Abdomen is nondistended, soft and nontender. No organomegaly or masses felt. Normal bowel sounds heard. Central nervous system: Alert and oriented. No focal neurological deficits. Extremities: Symmetric 5 x 5 power. Skin: No rashes, lesions or ulcers. Psychiatry: Judgement and insight appear normal. Mood & affect appropriate.    Data Reviewed: I have personally reviewed following labs and imaging studies  CBC: Recent Labs  Lab 07/16/20 0043 07/17/20 0450 07/18/20 0144 07/19/20 0358 07/20/20 0207  WBC 11.4* 11.3* 9.8 9.5 9.9  HGB 9.4* 9.0* 9.1* 9.3* 9.3*  HCT 29.2* 28.6* 29.4* 29.7* 29.6*  MCV 98.6 99.3 99.7 99.3 99.0  PLT 458* 463* 495* 506* 062*   Basic Metabolic Panel: Recent Labs  Lab 07/16/20 0043 07/17/20 0450 07/18/20 0144 07/19/20 0358 07/20/20 0207  NA 139 142 141 140 141  K 3.3* 4.0 3.2* 4.0 4.0  CL 99 103 102 99 101  CO2 $Re'29 29 26 29 30  'yfj$ GLUCOSE 87 97 97 107* 97  BUN <5* <5* <5* <5* <5*  CREATININE 0.63 0.67 0.71 0.72 0.49  CALCIUM 8.5* 8.6* 8.5* 8.4* 9.0   GFR: Estimated Creatinine Clearance: 87.3 mL/min (by C-G formula based on SCr of 0.49 mg/dL). Liver Function Tests: Recent Labs  Lab 07/14/20 0140 07/16/20 0043 07/17/20 0450 07/18/20 0144  AST 10* 10* 11* 12*  ALT $Re'8 8 9 8  'QHy$ ALKPHOS 119 147* 144* 150*  BILITOT 0.5 0.4 0.6 0.5  PROT 4.9* 5.1* 5.3* 5.6*   ALBUMIN 2.0* 2.2* 2.2* 2.3*   No results for input(s): LIPASE, AMYLASE in the last 168 hours. No results for input(s): AMMONIA in the last 168 hours. Coagulation Profile: No results for input(s): INR, PROTIME in the last 168 hours. Cardiac Enzymes: No results for input(s): CKTOTAL, CKMB, CKMBINDEX, TROPONINI in the last 168 hours. BNP (last 3 results) No results for input(s): PROBNP in the last 8760 hours. HbA1C: No results for input(s): HGBA1C in the last 72 hours. CBG: Recent Labs  Lab 07/19/20 1625 07/19/20 2021 07/20/20 0438 07/20/20 0757 07/20/20 1150  GLUCAP 86 84 106* 93 103*   Lipid Profile: No results for input(s): CHOL, HDL, LDLCALC, TRIG, CHOLHDL, LDLDIRECT in the last 72 hours. Thyroid Function Tests: No results for input(s): TSH, T4TOTAL, FREET4, T3FREE, THYROIDAB in the last 72 hours. Anemia Panel: No results for input(s): VITAMINB12, FOLATE, FERRITIN, TIBC, IRON, RETICCTPCT in the last 72 hours. Sepsis Labs: No results for input(s): PROCALCITON, LATICACIDVEN in the last 168 hours.  Recent Results (from the past 240 hour(s))  Respiratory Panel by RT PCR (Flu A&B,  Covid) - Nasopharyngeal Swab     Status: None   Collection Time: 07/11/20  4:18 AM   Specimen: Nasopharyngeal Swab  Result Value Ref Range Status   SARS Coronavirus 2 by RT PCR NEGATIVE NEGATIVE Final    Comment: (NOTE) SARS-CoV-2 target nucleic acids are NOT DETECTED.  The SARS-CoV-2 RNA is generally detectable in upper respiratoy specimens during the acute phase of infection. The lowest concentration of SARS-CoV-2 viral copies this assay can detect is 131 copies/mL. A negative result does not preclude SARS-Cov-2 infection and should not be used as the sole basis for treatment or other patient management decisions. A negative result may occur with  improper specimen collection/handling, submission of specimen other than nasopharyngeal swab, presence of viral mutation(s) within the areas  targeted by this assay, and inadequate number of viral copies (<131 copies/mL). A negative result must be combined with clinical observations, patient history, and epidemiological information. The expected result is Negative.  Fact Sheet for Patients:  PinkCheek.be  Fact Sheet for Healthcare Providers:  GravelBags.it  This test is no t yet approved or cleared by the [place] FDA and  has been authorized for detection and/or diagnosis of SARS-CoV-2 by FDA under an Emergency Use Authorization (EUA). This EUA will remain  in effect (meaning this test can be used) for the duration of the COVID-19 declaration under Section 564(b)(1) of the Act, 21 U.S.C. section 360bbb-3(b)(1), unless the authorization is terminated or revoked sooner.     Influenza A by PCR NEGATIVE NEGATIVE Final   Influenza B by PCR NEGATIVE NEGATIVE Final    Comment: (NOTE) The Xpert Xpress SARS-CoV-2/FLU/RSV assay is intended as an aid in  the diagnosis of influenza from Nasopharyngeal swab specimens and  should not be used as a sole basis for treatment. Nasal washings and  aspirates are unacceptable for Xpert Xpress SARS-CoV-2/FLU/RSV  testing.  Fact Sheet for Patients: PinkCheek.be  Fact Sheet for Healthcare Providers: GravelBags.it  This test is not yet approved or cleared by the [place] FDA and  has been authorized for detection and/or diagnosis of SARS-CoV-2 by  FDA under an Emergency Use Authorization (EUA). This EUA will remain  in effect (meaning this test can be used) for the duration of the  Covid-19 declaration under Section 564(b)(1) of the Act, 21  U.S.C. section 360bbb-3(b)(1), unless the authorization is  terminated or revoked. Performed at [facility], [place] [address] [place],  [address]       Radiology Studies: No results found.  Scheduled  Meds: . enoxaparin (LOVENOX) injection  40 mg Subcutaneous Q24H  . estradiol  1 mg Per Tube Daily  . feeding supplement (PROSource TF)  45 mL Per Tube Daily  . free water  150 mL Per Tube Q4H  . mometasone-formoterol  2 puff Inhalation BID  . multivitamin with minerals  1 tablet Per Tube Daily  . pantoprazole (PROTONIX) IV  40 mg Intravenous Q12H  . potassium chloride  20 mEq Per Tube Daily  . sodium chloride flush  3 mL Intravenous Q12H   Continuous Infusions: . feeding supplement (VITAL 1.5 CAL) 1,000 mL (07/20/20 0838)  . lactated ringers 50 mL/hr at 07/19/20 2201     LOS: 10 days   Time spent: 35 minutes   Tonisha Silvey Loann Quill, MD [facility]  If 7PM-7AM, please contact night-coverage [email or web address] 07/20/2020, 2:20 PM

## 2020-07-20 NOTE — Progress Notes (Signed)
[facility] Gastroenterology Progress Note  Tricia Brown 44 y.o. [birth year]  CC: Pancreatitis with pancreatic pseudocyst   Subjective: Patient seen and examined at bedside.  Continues to have nausea and abdominal pain.  Tolerating NG tube feeding.  Last bowel movement was yesterday.  Passing gas.  Awaiting procedure for Monday.  ROS : Afebrile.  Negative for chest pain.   Objective: Vital signs in last 24 hours: Vitals:   07/20/20 0440 07/20/20 0736  BP: 130/74   Pulse: 91   Resp: 16   Temp: 97.7 F (36.5 C)   SpO2: 100% 98%    Physical Exam:  General:  Alert, cooperative, no distress, appears stated age, NG tube in place  Head:  Normocephalic, without obvious abnormality, atraumatic  Eyes:  , EOM's intact,   Lungs:    No respiratory distress noted  Heart:  Regular rate and rhythm, S1, S2 normal  Abdomen:    Epigastric fullness and tenderness to palpation, bowel sounds present, no peritoneal signs  Extremities: Extremities normal, atraumatic, no  edema       Lab Results: Recent Labs    07/19/20 0358 07/20/20 0207  NA 140 141  K 4.0 4.0  CL 99 101  CO2 29 30  GLUCOSE 107* 97  BUN <5* <5*  CREATININE 0.72 0.49  CALCIUM 8.4* 9.0   Recent Labs    07/18/20 0144  AST 12*  ALT 8  ALKPHOS 150*  BILITOT 0.5  PROT 5.6*  ALBUMIN 2.3*   Recent Labs    07/19/20 0358 07/20/20 0207  WBC 9.5 9.9  HGB 9.3* 9.3*  HCT 29.7* 29.6*  MCV 99.3 99.0  PLT 506* 527*   No results for input(s): LABPROT, INR in the last 72 hours.    Assessment/Plan: -Acute pancreatitis complicated by large 8.7 cm pseudocyst formation causing mass-effect on the body of the stomach.  Recommendations -------------------------- -Continue supportive care with NG tube feeding for now. -Plan for CT with IV and oral contrast tomorrow followed by cystgastrostomy on Monday with Dr. Meridee Score -GI will follow  Kathi Der MD, FACP 07/20/2020, 9:06 AM  Contact #  [phone]

## 2020-07-21 ENCOUNTER — Encounter (HOSPITAL_COMMUNITY): Payer: Self-pay | Admitting: Internal Medicine

## 2020-07-21 ENCOUNTER — Inpatient Hospital Stay (HOSPITAL_COMMUNITY): Payer: Medicaid Other

## 2020-07-21 DIAGNOSIS — K859 Acute pancreatitis without necrosis or infection, unspecified: Secondary | ICD-10-CM | POA: Diagnosis not present

## 2020-07-21 DIAGNOSIS — E876 Hypokalemia: Secondary | ICD-10-CM | POA: Diagnosis not present

## 2020-07-21 DIAGNOSIS — F1120 Opioid dependence, uncomplicated: Secondary | ICD-10-CM | POA: Diagnosis not present

## 2020-07-21 LAB — GLUCOSE, CAPILLARY
Glucose-Capillary: 102 mg/dL — ABNORMAL HIGH (ref 70–99)
Glucose-Capillary: 115 mg/dL — ABNORMAL HIGH (ref 70–99)
Glucose-Capillary: 117 mg/dL — ABNORMAL HIGH (ref 70–99)
Glucose-Capillary: 123 mg/dL — ABNORMAL HIGH (ref 70–99)
Glucose-Capillary: 138 mg/dL — ABNORMAL HIGH (ref 70–99)
Glucose-Capillary: 89 mg/dL (ref 70–99)
Glucose-Capillary: 97 mg/dL (ref 70–99)

## 2020-07-21 LAB — CBC
HCT: 32 % — ABNORMAL LOW (ref 36.0–46.0)
Hemoglobin: 9.9 g/dL — ABNORMAL LOW (ref 12.0–15.0)
MCH: 30.9 pg (ref 26.0–34.0)
MCHC: 30.9 g/dL (ref 30.0–36.0)
MCV: 100 fL (ref 80.0–100.0)
Platelets: 604 10*3/uL — ABNORMAL HIGH (ref 150–400)
RBC: 3.2 MIL/uL — ABNORMAL LOW (ref 3.87–5.11)
RDW: 13.2 % (ref 11.5–15.5)
WBC: 10.8 10*3/uL — ABNORMAL HIGH (ref 4.0–10.5)
nRBC: 0 % (ref 0.0–0.2)

## 2020-07-21 LAB — BASIC METABOLIC PANEL
Anion gap: 9 (ref 5–15)
BUN: <5 mg/dL — ABNORMAL LOW (ref 6–20)
CO2: 30 mmol/L (ref 22–32)
Calcium: 9 mg/dL (ref 8.9–10.3)
Chloride: 100 mmol/L (ref 98–111)
Creatinine, Ser: 0.73 mg/dL (ref 0.44–1.00)
GFR, Estimated: >60 mL/min (ref >60)
Glucose, Bld: 102 mg/dL — ABNORMAL HIGH (ref 70–99)
Potassium: 4.4 mmol/L (ref 3.5–5.1)
Sodium: 139 mmol/L (ref 135–145)

## 2020-07-21 MED ORDER — ENOXAPARIN SODIUM 40 MG/0.4ML ~~LOC~~ SOLN: 40.0000 mg | SUBCUTANEOUS | Status: DC

## 2020-07-21 MED ORDER — IOHEXOL 300 MG/ML  SOLN
100.0000 mL | Freq: Once | INTRAMUSCULAR | Status: AC | PRN
  Administered 2020-07-21: 100 mL via INTRAVENOUS

## 2020-07-21 MED ORDER — POLYETHYLENE GLYCOL 3350 17 G PO PACK
17.0000 g | PACK | Freq: Every day | ORAL | Status: DC
  Administered 2020-07-21: 17 g via ORAL
  Filled 2020-07-21: qty 1

## 2020-07-21 MED ORDER — POLYETHYLENE GLYCOL 3350 17 G PO PACK
17.0000 g | PACK | Freq: Every day | ORAL | Status: DC
  Administered 2020-07-22: 17 g
  Filled 2020-07-21: qty 1

## 2020-07-21 MED ORDER — PIPERACILLIN-TAZOBACTAM 3.375 G IVPB 30 MIN
3.3750 g | INTRAVENOUS | Status: AC
  Administered 2020-07-22: 3.375 g via INTRAVENOUS
  Filled 2020-07-21: qty 50

## 2020-07-21 MED ORDER — SODIUM CHLORIDE 0.9 % IV SOLN: INTRAVENOUS | Status: DC

## 2020-07-21 NOTE — Progress Notes (Signed)
PROGRESS NOTE    Tricia Brown  ELF:[id number] DOB: [birth year] DOA: 07/10/2020 PCP: Lorelee Market, MD   Brief Narrative:  44 y.o.femalewith medical history significant ofendometriosis status post diagnostic laparoscopy and hysterectomy, IBS-D, GERD, status post cholecystectomy presents with abdominal pain x4 days, epigastric and left upper quadrant with associated nausea, vomiting and diarrhea, pain rated 4 out of 10 and when flares of up to 8 out of 10 with certain movement and following meals, not improved by Tums and daily PPI at home.  Drinks alcohol about sixpack on occasion on weekends. In the ED vitals stable labs with hypokalemia 3.0 alk phos 230, leukocytosis 14 K lipase 151 troponin x2 - UA normal CT abdomen showed pancreatitis with fluid collection consistent with pseudocyst reported as "stranding noted around the pancreatic body and tail compatible with acute pancreatitis. Adjacent fluid collections, the largest 3.8 cm most compatible with acute fluid collections and developing pseudocysts. Secondary reactive inflammation of the adjacent gastric wall. Small to moderate free fluid in the pelvis" Patient was given IV fluids pain medication Zofran and admission was requested. GI was consulted.  Had MRCP no evidence of choledocholithiasis. Patient is  being treated for acute pancreatitis, possibly passed CBD stone. underwent repeat CTA 10/25-that showed multiple pseudocyst pressing on the stomach-largest being 8.7 cm with significant mass-effect on the gastric body  Assessment & Plan:  Acute pancreatitis pseudocyst formation largest 8.7 cm with significant mass-effect on the stomach: - MRCP negative for choledocholithiasis. slightly elevated ALP otherwise stable LFTs. - Repeat CT abdomen 10/25 shows multiple pseudocyst -Continue aggressive IV hydration and pain management w/ IV Dilaudid oxy, bentyl -Has not tolerated much clear liquid therefore coretrak nasojejunal tube inserted  and tube feed has been started. -Repeat CT scan abdomen/pelvis on 07/21/2020-is pending, plan is for cystogastrostomy by Dr. Rush Landmark on 07/22/2020 -Appreciate GI help -NPO after midnight, hold prophylactic Lovenox. -Zosyn IV once preop on 11/1.  Fluid overload with leg edema and shortness of breath: Improved after IV Lasix -Continue IV hydration and monitor signs of fluid overload  Loose stool intermittently: Continue IV fluids.    Hypokalemia: Resolved  Hypoalbuminemia in the setting of acute pancreatitis  Anemia of chronic disease hemoglobin ranging 9 to 11 g.  Monitor. -No signs of active bleeding.  Monitor H&H closely.  Thrombocytosis: Platelet: 604 -Likely reactive.  Continue to monitor  FEN: -On coretrak tube feedings as per GI-hold tube feedings from midnight.  Mild metabolic acidosis/dehydration due to #1.  Resolved. Chronic back pain, controlled, continue pain management Depression: Her mood is stable and cheerful. Leukocytosis : Resolved  DVT prophylaxis: Lovenox/SCD Code Status: Full code Family Communication:  None present at bedside.  Plan of care discussed with patient in length and he verbalized understanding and agreed with it. Disposition Plan: Home after the procedure  Consultants:   GI  Procedures:   As above  Antimicrobials:   None  Status is: Inpatient   Dispo: The patient is from: Home              Anticipated d/c is to: Home              Anticipated d/c date is: 07/24/2020              Patient currently not medically stable for the discharge    Subjective: Patient seen and examined.  Continues to have nausea and epigastric pain.  Denies vomiting, fever, chills.  objective: Vitals:   07/20/20 1325 07/20/20 1520 07/20/20 2023 07/21/20 0436  BP:  121/71 126/78 117/74 114/82  Pulse: 93 90 94 88  Resp:  _0 Temp:  97.6 F (36.4 C) 98.9 F (37.2 C) 97.6 F (36.4 C)  TempSrc:  Oral Oral Oral  SpO2: 98% 97% 95% 99%    Weight:      Height:        Intake/Output Summary (Last 24 hours) at 07/21/2020 1405 Last data filed at 07/21/2020 0900 Gross per 24 hour  Intake 60 ml  Output --  Net 60 ml   Filed Weights   07/17/20 1500  Weight: 63.5 kg   General exam: Appears calm and comfortable, has NG tube Respiratory system: Clear to auscultation. Respiratory effort normal. Cardiovascular system: S1 & S2 heard, RRR. No JVD, murmurs, rubs, gallops or clicks. No pedal edema. Gastrointestinal system: Soft, epigastric tenderness positive, no guarding, no rigidity, bowel sounds positive. Central nervous system: Alert and oriented. No focal neurological deficits. Extremities: Symmetric 5 x 5 power. Skin: No rashes, lesions or ulcers. Psychiatry: Judgement and insight appear normal. Mood & affect appropriate.     Data Reviewed: I have personally reviewed following labs and imaging studies  CBC: Recent Labs  Lab 07/17/20 0450 07/18/20 0144 07/19/20 0358 07/20/20 0207 07/21/20 0411  WBC 11.3* 9.8 9.5 9.9 10.8*  HGB 9.0* 9.1* 9.3* 9.3* 9.9*  HCT 28.6* 29.4* 29.7* 29.6* 32.0*  MCV 99.3 99.7 99.3 99.0 100.0  PLT 463* 495* 506* 527* 813*   Basic Metabolic Panel: Recent Labs  Lab 07/17/20 0450 07/18/20 0144 07/19/20 0358 07/20/20 0207 07/21/20 0411  NA 142 141 140 141 139  K 4.0 3.2* 4.0 4.0 4.4  CL 103 102 99 101 100  CO2 _1 GLUCOSE 97 97 107* 97 102*  BUN <5* <5* <5* <5* <5*  CREATININE 0.67 0.71 0.72 0.49 0.73  CALCIUM 8.6* 8.5* 8.4* 9.0 9.0   GFR: Estimated Creatinine Clearance: 87.3 mL/min (by C-G formula based on SCr of 0.73 mg/dL). Liver Function Tests: Recent Labs  Lab 07/16/20 0043 07/17/20 0450 07/18/20 0144  AST 10* 11* 12*  ALT _2 ALKPHOS 147* 144* 150*  BILITOT 0.4 0.6 0.5  PROT 5.1* 5.3* 5.6*  ALBUMIN 2.2* 2.2* 2.3*   No results for input(s): LIPASE, AMYLASE in the last 168 hours. No results for input(s): AMMONIA in the last 168 hours. Coagulation  Profile: No results for input(s): INR, PROTIME in the last 168 hours. Cardiac Enzymes: No results for input(s): CKTOTAL, CKMB, CKMBINDEX, TROPONINI in the last 168 hours. BNP (last 3 results) No results for input(s): PROBNP in the last 8760 hours. HbA1C: No results for input(s): HGBA1C in the last 72 hours. CBG: Recent Labs  Lab 07/20/20 2025 07/21/20 0023 07/21/20 0433 07/21/20 0835 07/21/20 1204  GLUCAP 114* 117* 97 123* 89   Lipid Profile: No results for input(s): CHOL, HDL, LDLCALC, TRIG, CHOLHDL, LDLDIRECT in the last 72 hours. Thyroid Function Tests: No results for input(s): TSH, T4TOTAL, FREET4, T3FREE, THYROIDAB in the last 72 hours. Anemia Panel: No results for input(s): VITAMINB12, FOLATE, FERRITIN, TIBC, IRON, RETICCTPCT in the last 72 hours. Sepsis Labs: No results for input(s): PROCALCITON, LATICACIDVEN in the last 168 hours.  No results found for this or any previous visit (from the past 240 hour(s)).    Radiology Studies: No results found.  Scheduled Meds: . [START ON 07/23/2020] enoxaparin (LOVENOX) injection  40 mg Subcutaneous Q24H  . estradiol  1 mg Per Tube Daily  . feeding supplement (PROSource  TF)  45 mL Per Tube Daily  . free water  150 mL Per Tube Q4H  . mometasone-formoterol  2 puff Inhalation BID  . multivitamin with minerals  1 tablet Per Tube Daily  . pantoprazole (PROTONIX) IV  40 mg Intravenous Q12H  . [START ON 07/22/2020] polyethylene glycol  17 g Per Tube Daily  . potassium chloride  20 mEq Per Tube Daily  . sodium chloride flush  3 mL Intravenous Q12H   Continuous Infusions: . feeding supplement (VITAL 1.5 CAL) 1,000 mL (07/21/20 0035)  . lactated ringers 50 mL/hr at 07/20/20 1518  . [START ON 07/22/2020] piperacillin-tazobactam       LOS: 11 days   Time spent: 35 minutes   Aika Brzoska Loann Quill, MD [facility]  If 7PM-7AM, please contact night-coverage [email or web address] 07/21/2020, 2:05 PM

## 2020-07-21 NOTE — Progress Notes (Signed)
Pharmacy Antibiotic Note  Tricia Brown is a 44 y.o. female admitted on 07/10/2020 with acute pancreatitis with pancreatic pseudocyst. Cystgastrostomy planned on 11/1. Pharmacy has been consulted for Zosyn pre-op dosing.  Patient has normal renal function, afebrile, WBC slightly elevated at 10.8.  Plan: Zosyn 3.375 gm IV once infused over 30 minutes to be given 60 minutes pre-op on 11/1 Pharmacy will sign off and f/u antibiotic plan peripherally   Height: 5\' 7"  (170.2 cm) Weight: 63.5 kg (139 lb 15.9 oz) IBW/kg (Calculated) : 61.6  Temp (24hrs), Avg:98 F (36.7 C), Min:97.6 F (36.4 C), Max:98.9 F (37.2 C)  Recent Labs  Lab 07/17/20 0450 07/18/20 0144 07/19/20 0358 07/20/20 0207 07/21/20 0411  WBC 11.3* 9.8 9.5 9.9 10.8*  CREATININE 0.67 0.71 0.72 0.49 0.73    Estimated Creatinine Clearance: 87.3 mL/min (by C-G formula based on SCr of 0.73 mg/dL).    No Known Allergies  Thank you for allowing pharmacy to be a part of this patient's care.  07/23/20, PharmD PGY2 Pharmacy Resident Phone between 7 am - 3:30 pm: Lulu Riding  Please check AMION for all [facility] phone numbers After 10:00 PM, call Main Pharmacy [phone]  07/21/2020 9:52 AM

## 2020-07-21 NOTE — Progress Notes (Signed)
[facility] Gastroenterology Progress Note  Tricia Brown 44 y.o. 12-28-1975  CC: Pancreatitis with pancreatic pseudocyst   Subjective: Patient seen and examined at bedside.  No significant changes compared to yesterday.  Continues to have nausea.  Continues to have abdominal pain.  Last bowel movement on Friday.  ROS : Afebrile.  Negative for chest pain.   Objective: Vital signs in last 24 hours: Vitals:   07/20/20 2023 07/21/20 0436  BP: 117/74 114/82  Pulse: 94 88  Resp: 18 18  Temp: 98.9 F (37.2 C) 97.6 F (36.4 C)  SpO2: 95% 99%    Physical Exam:  General:  Alert, cooperative, no distress, appears stated age, NG tube in place  Head:  Normocephalic, without obvious abnormality, atraumatic  Eyes:  , EOM's intact,   Lungs:    No respiratory distress noted  Heart:  Regular rate and rhythm, S1, S2 normal  Abdomen:    Epigastric fullness and tenderness to palpation, bowel sounds present, no peritoneal signs  Extremities: Extremities normal, atraumatic, no  edema       Lab Results: Recent Labs    07/20/20 0207 07/21/20 0411  NA 141 139  K 4.0 4.4  CL 101 100  CO2 30 30  GLUCOSE 97 102*  BUN <5* <5*  CREATININE 0.49 0.73  CALCIUM 9.0 9.0   No results for input(s): AST, ALT, ALKPHOS, BILITOT, PROT, ALBUMIN in the last 72 hours. Recent Labs    07/20/20 0207 07/21/20 0411  WBC 9.9 10.8*  HGB 9.3* 9.9*  HCT 29.6* 32.0*  MCV 99.0 100.0  PLT 527* 604*   No results for input(s): LABPROT, INR in the last 72 hours.    Assessment/Plan: -Acute pancreatitis complicated by large 8.7 cm pseudocyst formation causing mass-effect on the body of the stomach.  Recommendations -------------------------- -Discussed with RN. Hold tube feeding from midnight.  Hold prophylactic Lovenox. -Plan for CT with IV and oral contrast today followed by cystgastrostomy  tomorrow with Dr. Meridee Score -Eather Colas for constipation -GI will follow  Kathi Der MD, FACP 07/21/2020, 9:15  AM  Contact #  [phone]

## 2020-07-22 ENCOUNTER — Inpatient Hospital Stay (HOSPITAL_COMMUNITY): Payer: Medicaid Other | Admitting: Certified Registered"

## 2020-07-22 ENCOUNTER — Inpatient Hospital Stay (HOSPITAL_COMMUNITY): Payer: Medicaid Other

## 2020-07-22 ENCOUNTER — Encounter (HOSPITAL_COMMUNITY)
Admission: EM | Disposition: A | Discharge status: Home / Self Care | Payer: Self-pay | Source: Home / Self Care | Attending: Internal Medicine

## 2020-07-22 ENCOUNTER — Encounter (HOSPITAL_COMMUNITY): Payer: Self-pay | Admitting: Internal Medicine

## 2020-07-22 DIAGNOSIS — K862 Cyst of pancreas: Secondary | ICD-10-CM

## 2020-07-22 DIAGNOSIS — K3189 Other diseases of stomach and duodenum: Secondary | ICD-10-CM | POA: Diagnosis not present

## 2020-07-22 DIAGNOSIS — K859 Acute pancreatitis without necrosis or infection, unspecified: Secondary | ICD-10-CM | POA: Diagnosis not present

## 2020-07-22 DIAGNOSIS — B3781 Candidal esophagitis: Secondary | ICD-10-CM

## 2020-07-22 DIAGNOSIS — E876 Hypokalemia: Secondary | ICD-10-CM | POA: Diagnosis not present

## 2020-07-22 HISTORY — PX: BIOPSY: SHX5522

## 2020-07-22 HISTORY — PX: ESOPHAGOGASTRODUODENOSCOPY (EGD) WITH PROPOFOL: SHX5813

## 2020-07-22 HISTORY — PX: BALLOON DILATION: SHX5330

## 2020-07-22 HISTORY — PX: CYST GASTROSTOMY: SHX6862

## 2020-07-22 HISTORY — PX: PANCREATIC STENT PLACEMENT: SHX5539

## 2020-07-22 HISTORY — PX: EUS: SHX5427

## 2020-07-22 LAB — PROTIME-INR
INR: 1.1 (ref 0.8–1.2)
Prothrombin Time: 13.3 seconds (ref 11.4–15.2)

## 2020-07-22 LAB — CBC
HCT: 32.3 % — ABNORMAL LOW (ref 36.0–46.0)
HCT: 34.9 % — ABNORMAL LOW (ref 36.0–46.0)
Hemoglobin: 10.1 g/dL — ABNORMAL LOW (ref 12.0–15.0)
Hemoglobin: 11 g/dL — ABNORMAL LOW (ref 12.0–15.0)
MCH: 30.7 pg (ref 26.0–34.0)
MCH: 31.2 pg (ref 26.0–34.0)
MCHC: 31.3 g/dL (ref 30.0–36.0)
MCHC: 31.5 g/dL (ref 30.0–36.0)
MCV: 98.2 fL (ref 80.0–100.0)
MCV: 98.9 fL (ref 80.0–100.0)
Platelets: 672 10*3/uL — ABNORMAL HIGH (ref 150–400)
Platelets: 748 10*3/uL — ABNORMAL HIGH (ref 150–400)
RBC: 3.29 MIL/uL — ABNORMAL LOW (ref 3.87–5.11)
RBC: 3.53 MIL/uL — ABNORMAL LOW (ref 3.87–5.11)
RDW: 13.2 % (ref 11.5–15.5)
RDW: 13.1 % (ref 11.5–15.5)
WBC: 12.5 10*3/uL — ABNORMAL HIGH (ref 4.0–10.5)
WBC: 14.4 10*3/uL — ABNORMAL HIGH (ref 4.0–10.5)
nRBC: 0 % (ref 0.0–0.2)
nRBC: 0 % (ref 0.0–0.2)

## 2020-07-22 LAB — COMPREHENSIVE METABOLIC PANEL
ALT: 8 U/L (ref 0–44)
AST: 11 U/L — ABNORMAL LOW (ref 15–41)
Albumin: 2.3 g/dL — ABNORMAL LOW (ref 3.5–5.0)
Alkaline Phosphatase: 140 U/L — ABNORMAL HIGH (ref 38–126)
Anion gap: 10 (ref 5–15)
BUN: 8 mg/dL (ref 6–20)
CO2: 28 mmol/L (ref 22–32)
Calcium: 8.9 mg/dL (ref 8.9–10.3)
Chloride: 97 mmol/L — ABNORMAL LOW (ref 98–111)
Creatinine, Ser: 0.74 mg/dL (ref 0.44–1.00)
GFR, Estimated: >60 mL/min (ref >60)
Glucose, Bld: 82 mg/dL (ref 70–99)
Potassium: 4.7 mmol/L (ref 3.5–5.1)
Sodium: 135 mmol/L (ref 135–145)
Total Bilirubin: 0.6 mg/dL (ref 0.3–1.2)
Total Protein: 6.4 g/dL — ABNORMAL LOW (ref 6.5–8.1)

## 2020-07-22 LAB — GLUCOSE, CAPILLARY
Glucose-Capillary: 93 mg/dL (ref 70–99)
Glucose-Capillary: 92 mg/dL (ref 70–99)
Glucose-Capillary: 73 mg/dL (ref 70–99)
Glucose-Capillary: 98 mg/dL (ref 70–99)

## 2020-07-22 LAB — TROPONIN I (HIGH SENSITIVITY)
Troponin I (High Sensitivity): <2 ng/L (ref <18)
Troponin I (High Sensitivity): 2 ng/L (ref <18)

## 2020-07-22 LAB — AMYLASE: Amylase: 154 U/L — ABNORMAL HIGH (ref 28–100)

## 2020-07-22 LAB — LIPASE, BLOOD: Lipase: 92 U/L — ABNORMAL HIGH (ref 11–51)

## 2020-07-22 SURGERY — ESOPHAGOGASTRODUODENOSCOPY (EGD) WITH PROPOFOL
Anesthesia: General

## 2020-07-22 MED ORDER — PROMETHAZINE HCL 25 MG/ML IJ SOLN
INTRAMUSCULAR | Status: AC
  Filled 2020-07-22: qty 1

## 2020-07-22 MED ORDER — LIDOCAINE 2% (20 MG/ML) 5 ML SYRINGE
INTRAMUSCULAR | Status: DC | PRN
  Administered 2020-07-22: 100 mg via INTRAVENOUS

## 2020-07-22 MED ORDER — FENTANYL CITRATE (PF) 100 MCG/2ML IJ SOLN
INTRAMUSCULAR | Status: AC
  Filled 2020-07-22: qty 2

## 2020-07-22 MED ORDER — FENTANYL CITRATE (PF) 100 MCG/2ML IJ SOLN
25.0000 ug | INTRAMUSCULAR | Status: DC | PRN
  Administered 2020-07-22: 50 ug via INTRAVENOUS
  Administered 2020-07-22: 25 ug via INTRAVENOUS
  Administered 2020-07-22: 50 ug via INTRAVENOUS
  Administered 2020-07-22: 25 ug via INTRAVENOUS

## 2020-07-22 MED ORDER — LACTATED RINGERS IV SOLN
INTRAVENOUS | Status: AC | PRN
  Administered 2020-07-22: 1000 mL via INTRAVENOUS

## 2020-07-22 MED ORDER — SUCCINYLCHOLINE CHLORIDE 200 MG/10ML IV SOSY
PREFILLED_SYRINGE | INTRAVENOUS | Status: DC | PRN
  Administered 2020-07-22: 100 mg via INTRAVENOUS

## 2020-07-22 MED ORDER — PHENYLEPHRINE HCL-NACL 10-0.9 MG/250ML-% IV SOLN
INTRAVENOUS | Status: DC | PRN
  Administered 2020-07-22: 30 ug/min via INTRAVENOUS

## 2020-07-22 MED ORDER — PIPERACILLIN-TAZOBACTAM 3.375 G IVPB
3.3750 g | Freq: Three times a day (TID) | INTRAVENOUS | Status: DC
  Administered 2020-07-22 – 2020-07-28 (×17): 3.375 g via INTRAVENOUS
  Filled 2020-07-22 (×17): qty 50

## 2020-07-22 MED ORDER — PROPOFOL 10 MG/ML IV BOLUS
INTRAVENOUS | Status: DC | PRN
  Administered 2020-07-22: 50 mg via INTRAVENOUS
  Administered 2020-07-22: 150 mg via INTRAVENOUS

## 2020-07-22 MED ORDER — PROMETHAZINE HCL 25 MG/ML IJ SOLN
6.2500 mg | INTRAMUSCULAR | Status: DC | PRN
  Administered 2020-07-22: 6.25 mg via INTRAVENOUS

## 2020-07-22 MED ORDER — DEXAMETHASONE SODIUM PHOSPHATE 10 MG/ML IJ SOLN
INTRAMUSCULAR | Status: DC | PRN
  Administered 2020-07-22: 10 mg via INTRAVENOUS

## 2020-07-22 MED ORDER — FENTANYL CITRATE (PF) 250 MCG/5ML IJ SOLN
INTRAMUSCULAR | Status: DC | PRN
  Administered 2020-07-22 (×2): 50 ug via INTRAVENOUS

## 2020-07-22 MED ORDER — ONDANSETRON HCL 4 MG/2ML IJ SOLN
INTRAMUSCULAR | Status: DC | PRN
  Administered 2020-07-22: 4 mg via INTRAVENOUS

## 2020-07-22 MED ORDER — MIDAZOLAM HCL 5 MG/5ML IJ SOLN
INTRAMUSCULAR | Status: DC | PRN
  Administered 2020-07-22: 2 mg via INTRAVENOUS

## 2020-07-22 MED ORDER — ESMOLOL HCL 100 MG/10ML IV SOLN
INTRAVENOUS | Status: DC | PRN
  Administered 2020-07-22: 20 mg via INTRAVENOUS
  Administered 2020-07-22: 30 mg via INTRAVENOUS

## 2020-07-22 SURGICAL SUPPLY — 15 items

## 2020-07-22 NOTE — Progress Notes (Signed)
[facility] Progress Note  Tricia Brown 44 y.o. 03/28/1976  CC:  Pancreatitis with pseudocysts  Subjective: Patient reports continued abdominal pain, as well as nausea.  Has not had a bowel movement since Friday.  ROS : Review of Systems  Cardiovascular: Negative for chest pain and palpitations.  Gastrointestinal: Positive for abdominal pain, heartburn and nausea. Negative for blood in stool, constipation, diarrhea, melena and vomiting.    Objective: Vital signs in last 24 hours: Vitals:   07/21/20 2043 07/22/20 0349  BP:  108/78  Pulse:  98  Resp:  16  Temp:  98.1 F (36.7 C)  SpO2: 97% 98%    Physical Exam:  General:  Alert, oriented, cooperative, no distress; NGT in place  Head:  Normocephalic, without obvious abnormality, atraumatic  Eyes:  Anicteric sclera, EOMs intact  Lungs:   Clear to auscultation bilaterally, respirations unlabored  Heart:  Mildly tachycardic with regular rhythm, S1, S2 normal  Abdomen:   Soft, mildly distended, diffuse moderate tenderness, sluggish bowel sounds   Extremities: Extremities normal, no edema  Pulses: 2+ and symmetric    Lab Results: Recent Labs    07/21/20 0411 07/22/20 0119  NA 139 135  K 4.4 4.7  CL 100 97*  CO2 30 28  GLUCOSE 102* 82  BUN <5* 8  CREATININE 0.73 0.74  CALCIUM 9.0 8.9   Recent Labs    07/22/20 0119  AST 11*  ALT 8  ALKPHOS 140*  BILITOT 0.6  PROT 6.4*  ALBUMIN 2.3*   Recent Labs    07/21/20 0411 07/22/20 0119  WBC 10.8* 12.5*  HGB 9.9* 10.1*  HCT 32.0* 32.3*  MCV 100.0 98.2  PLT 604* 672*   Recent Labs    07/22/20 0119  LABPROT 13.3  INR 1.1      Assessment: Acute pancreatitis with pseudocyst formation. Worsening pain with advancement to full liquid diet. Unclear etiology. Post-cholecystectomy, normal lipids. She denies frequent or heavy alcohol use.  ?passed de novo CBD stone vs. idiopathic -CT 10/25 revealed interval development of several pseudocysts. 8.7 x 6.3  cm organized cyst/pseudocyst which has significant mass effect on the body of the stomach.  Improved appearance of the pancreas. Regressing inflammatory changes and no evidence of pancreatic necrosis. -CT 10/31: Interval increase in size of large, well-organized pseudo cyst within the lesser sac which extends along the posterior wall of the stomach. The smaller pseudocysts within and around the tail of pancreas are unchanged. -Normal renal function: BUN 8/ Cr 0.74 -WBCs elevated to 12.5, as compared to 10.8 yesterday -Mildly elevated ALP, otherwise normal LFTs: T. Bili 0.6/ AST 11/ ALT 8/ ALP 140  -Triglycerides normal (71) as of 10/20  Plan: Cyst gastrostomy today with Dr. Mansouraty.  Continue supportive care.   [facility] will follow.  Jahmire Ruffins Baron-Johnson PA-C 07/22/2020, 9:32 AM  Contact #  [phone] 

## 2020-07-22 NOTE — Progress Notes (Signed)
Brief GI Progress Note  Patient evaluated in post-procedure PACU. Having tachycardia. She has left sided-chest pain. She is having abdominal pain generalized throughout. Her physical examination shows decreased though present breath sounds throughout the lung bases. She has normal bowel sounds. She has volitional guarding throughout the abdomen but no overt rebound. EKG obtained and looks to be sinus tachycardia without any other EKG changes present compared to prior. She is obtaining Fentanyl at this time. I have ordered STAT CXR and KUB to evaluate if there are any signs of complications from the procedure itself such as perforation or free air. I will reach out to primary medical team to update. If Xrays are unremarkable then patient will need repeat labs - CBC/CMP/Lipase/Amylase/Troponin to likely be performed. She will also likely need CT scans to further evaluate if issues are present.  Corliss Parish, MD  Gastroenterology Advanced Endoscopy Office # [phone]

## 2020-07-22 NOTE — Progress Notes (Signed)
Pharmacy Antibiotic Note  Tricia Brown is a 44 y.o. female admitted on 07/10/2020 with pancreatitis complicated by pseudocyst formation.  Pharmacy has been consulted for Zosyn dosing for intra-abdominal coverage.    Zosyn 3.375 gm IV given prior to endoscopic procedure, stent placed today.  Fluid sent for culture.  Good renal function.   Plan:  Continue Zosyn 3.375 gm IV q8hrs (each over 4 hours).  Follow renal function, culture data, and clinical progress.  Noted plan to continue antibiotics until stent removed, and to transition to oral antibiotics when able.  Height: 5\' 7"  (170.2 cm) Weight: 63.5 kg (140 lb) IBW/kg (Calculated) : 61.6  Temp (24hrs), Avg:98 F (36.7 C), Min:97 F (36.1 C), Max:98.6 F (37 C)  Recent Labs  Lab 07/18/20 0144 07/19/20 0358 07/20/20 0207 07/21/20 0411 07/22/20 0119  WBC 9.8 9.5 9.9 10.8* 12.5*  CREATININE 0.71 0.72 0.49 0.73 0.74    Estimated Creatinine Clearance: 87.3 mL/min (by C-G formula based on SCr of 0.74 mg/dL).    No Known Allergies  Antimicrobials this admission:   Zosyn 11/1>>  Dose adjustments this admission:  n/a  Microbiology results:  10/21 COVID and flu: negative  11/1 fluid: sent  Thank you for allowing pharmacy to be a part of this patient's care.  13/1, Dennie Fetters Phone: [phone] 07/22/2020 7:45 PM

## 2020-07-22 NOTE — Progress Notes (Signed)
PROGRESS NOTE    Tricia Brown  HWE:[id number] DOB: 26-Aug-1976 DOA: 07/10/2020 PCP: Lorelee Market, MD   Brief Narrative:  44 y.o.femalewith medical history significant ofendometriosis status post diagnostic laparoscopy and hysterectomy, IBS-D, GERD, status post cholecystectomy presents with abdominal pain x4 days, epigastric and left upper quadrant with associated nausea, vomiting and diarrhea, pain rated 4 out of 10 and when flares of up to 8 out of 10 with certain movement and following meals, not improved by Tums and daily PPI at home.  Drinks alcohol about sixpack on occasion on weekends. In the ED vitals stable labs with hypokalemia 3.0 alk phos 230, leukocytosis 14 K lipase 151 troponin x2 - UA normal CT abdomen showed pancreatitis with fluid collection consistent with pseudocyst reported as "stranding noted around the pancreatic body and tail compatible with acute pancreatitis. Adjacent fluid collections, the largest 3.8 cm most compatible with acute fluid collections and developing pseudocysts. Secondary reactive inflammation of the adjacent gastric wall. Small to moderate free fluid in the pelvis" Patient was given IV fluids pain medication Zofran and admission was requested. GI was consulted.  Had MRCP no evidence of choledocholithiasis. Patient is  being treated for acute pancreatitis, possibly passed CBD stone. underwent repeat CTA 10/25-that showed multiple pseudocyst pressing on the stomach-largest being 8.7 cm with significant mass-effect on the gastric body  Assessment & Plan:  Acute pancreatitis pseudocyst formation largest 8.7 cm with significant mass-effect on the stomach: - MRCP negative for choledocholithiasis. slightly elevated ALP otherwise stable LFTs. - Repeat CT abdomen 10/25 shows multiple pseudocyst -Continue aggressive IV hydration and pain management w/ IV Dilaudid oxy, bentyl -Has not tolerated much clear liquid therefore coretrak nasojejunal tube inserted  and tube feed has been started. -Repeat CT scan abdomen/pelvis on 07/21/2020: Shows interval increase in size of large, well organized pseudocyst within the lesser sac which extends along the posterior wall of the stomach.  The smaller pseudocyst within the tail of pancreas are unchanged. -She remained afebrile however her WBC count trended up from 10.8-12.5 and platelet count trended up from 604 to 672. -Patient is scheduled for cystogastrostomy by Dr. Rush Landmark today.  She is NPO.  Fluid overload with leg edema and shortness of breath: Improved after IV Lasix -Continue IV hydration and monitor signs of fluid overload  Loose stool intermittently: Continue IV fluids.    Hypokalemia: Resolved  Hypoalbuminemia in the setting of acute pancreatitis.  Albumin: 2.3.  Anemia of chronic disease hemoglobin ranging 9 to 11 g.   -No signs of active bleeding.  Monitor H&H closely.  Thrombocytosis: Platelet: 672 -Likely reactive.  Continue to monitor  FEN: -On coretrak tube feedings as per GI-hold tube feedings from midnight.  Mild metabolic acidosis/dehydration due to #1.  Resolved. Chronic back pain, controlled, continue pain management Depression: Her mood is stable and cheerful. Leukocytosis : Resolved  DVT prophylaxis: SCD, Lovenox is on hold due to scheduled procedure this afternoon. Code Status: Full code Family Communication:  None present at bedside.  Plan of care discussed with patient in length and he verbalized understanding and agreed with it. Disposition Plan: Home after the procedure  Consultants:   GI  Procedures:   As above  Antimicrobials:   None  Status is: Inpatient   Dispo: The patient is from: Home              Anticipated d/c is to: Home              Anticipated d/c date is: 07/24/2020  Patient currently not medically stable for the discharge    Subjective: Patient seen and examined.  Very pleasant.  Tells me that she is waiting for her  surgery to feel better.  Continues to have nausea and epigastric pain.  Tolerating tube feeds well denies hematemesis or melena. objective: Vitals:   07/21/20 0436 07/21/20 1933 07/21/20 2043 07/22/20 0349  BP: 114/82 124/81  108/78  Pulse: 88 (!) 108  98  Resp: _0 Temp: 97.6 F (36.4 C) 98.3 F (36.8 C)  98.1 F (36.7 C)  TempSrc: Oral Oral  Oral  SpO2: 99% 96% 97% 98%  Weight:      Height:       No intake or output data in the 24 hours ending 07/22/20 1357 Filed Weights   07/17/20 1500  Weight: 63.5 kg   General exam: Appears calm and comfortable, on room air, communicating well Respiratory system: Clear to auscultation. Respiratory effort normal. Cardiovascular system: S1 & S2 heard, RRR. No JVD, murmurs, rubs, gallops or clicks. No pedal edema. Gastrointestinal system: Soft but distended, tender on palpation.  No guarding, no rigidity. Central nervous system: Alert and oriented. No focal neurological deficits. Extremities: Symmetric 5 x 5 power. Skin: No rashes, lesions or ulcers. Psychiatry: Judgement and insight appear normal. Mood & affect appropriate.     Data Reviewed: I have personally reviewed following labs and imaging studies  CBC: Recent Labs  Lab 07/18/20 0144 07/19/20 0358 07/20/20 0207 07/21/20 0411 07/22/20 0119  WBC 9.8 9.5 9.9 10.8* 12.5*  HGB 9.1* 9.3* 9.3* 9.9* 10.1*  HCT 29.4* 29.7* 29.6* 32.0* 32.3*  MCV 99.7 99.3 99.0 100.0 98.2  PLT 495* 506* 527* 604* 160*   Basic Metabolic Panel: Recent Labs  Lab 07/18/20 0144 07/19/20 0358 07/20/20 0207 07/21/20 0411 07/22/20 0119  NA 141 140 141 139 135  K 3.2* 4.0 4.0 4.4 4.7  CL 102 99 101 100 97*  CO2 _1 GLUCOSE 97 107* 97 102* 82  BUN <5* <5* <5* <5* 8  CREATININE 0.71 0.72 0.49 0.73 0.74  CALCIUM 8.5* 8.4* 9.0 9.0 8.9   GFR: Estimated Creatinine Clearance: 87.3 mL/min (by C-G formula based on SCr of 0.74 mg/dL). Liver Function Tests: Recent Labs  Lab  07/16/20 0043 07/17/20 0450 07/18/20 0144 07/22/20 0119  AST 10* 11* 12* 11*  ALT _2 ALKPHOS 147* 144* 150* 140*  BILITOT 0.4 0.6 0.5 0.6  PROT 5.1* 5.3* 5.6* 6.4*  ALBUMIN 2.2* 2.2* 2.3* 2.3*   No results for input(s): LIPASE, AMYLASE in the last 168 hours. No results for input(s): AMMONIA in the last 168 hours. Coagulation Profile: Recent Labs  Lab 07/22/20 0119  INR 1.1   Cardiac Enzymes: No results for input(s): CKTOTAL, CKMB, CKMBINDEX, TROPONINI in the last 168 hours. BNP (last 3 results) No results for input(s): PROBNP in the last 8760 hours. HbA1C: No results for input(s): HGBA1C in the last 72 hours. CBG: Recent Labs  Lab 07/21/20 1934 07/21/20 2353 07/22/20 0347 07/22/20 0759 07/22/20 1153  GLUCAP 115* 138* 93 92 73   Lipid Profile: No results for input(s): CHOL, HDL, LDLCALC, TRIG, CHOLHDL, LDLDIRECT in the last 72 hours. Thyroid Function Tests: No results for input(s): TSH, T4TOTAL, FREET4, T3FREE, THYROIDAB in the last 72 hours. Anemia Panel: No results for input(s): VITAMINB12, FOLATE, FERRITIN, TIBC, IRON, RETICCTPCT in the last 72 hours. Sepsis Labs: No results for input(s): PROCALCITON, LATICACIDVEN in the last 168  hours.  No results found for this or any previous visit (from the past 240 hour(s)).    Radiology Studies: CT ABDOMEN PELVIS W WO CONTRAST  Result Date: 07/22/2020 CLINICAL DATA:  Follow-up pancreatitis. EXAM: CT ABDOMEN AND PELVIS WITHOUT AND WITH CONTRAST TECHNIQUE: Multidetector CT imaging of the abdomen and pelvis was performed following the standard protocol before and following the bolus administration of intravenous contrast. CONTRAST:  110m OMNIPAQUE IOHEXOL 300 MG/ML  SOLN COMPARISON:  07/15/2020 the. FINDINGS: Lower chest: Subsegmental atelectasis identified within the lung bases. Hepatobiliary: No focal liver abnormality. Status post cholecystectomy. No biliary ductal dilatation. Pancreas: Stable to improved appearance  of peripancreatic inflammation. Stable cystic lesion within tail of pancreas measures 1.4 cm, image 42/11. Adjacent cyst projecting off the tail of pancreas is again noted measuring 1.4 cm, unchanged. Between the posterior wall of the stomach in tail of pancreas there is a cystic structure measuring 3.2 cm, image 33/11. Previously this measured 4.1 cm. The large, well-organized cystic structure within the lesser sac between the pancreas, stomach and spleen measures 9.9 x 6.8 by 11.6 cm, image 67/11. Previously this measured 8.3 x 6.5 by 10.4 cm. No new fluid collections identified. Spleen: There is a small perisplenic collection extending along the medial aspect of the spleen which measures approximately 0.7 cm in thickness, image 20/11. No signs of splenic infarct. Adrenals/Urinary Tract: Normal appearance of the adrenal glands. The kidneys are unremarkable. No mass or hydronephrosis identified. Urinary bladder is unremarkable. Stomach/Bowel: The stomach appears nondistended. No significant small bowel wall thickening, inflammation or distension. The scratch set there is mild wall thickening involving the sigmoid colon without surrounding inflammation. This is nonspecific and may reflect incomplete distention. Vascular/Lymphatic: No significant vascular findings are present. The portal vein, portal venous confluence and splenic vein remain patent. No enlarged abdominal or pelvic lymph nodes. Reproductive: Status post hysterectomy. No adnexal masses. Other: Interval resolution pelvic ascites. Musculoskeletal: No acute or significant osseous findings. IMPRESSION: 1. Stable to improved appearance of peripancreatic inflammation. 2. There is been interval increase in size of large, well-organized pseudo cyst within the lesser sac which extends along the posterior wall of the stomach. The smaller pseudocysts within and around the tail of pancreas are unchanged. 3. Interval resolution of pelvic ascites. 4. Mild wall  thickening involving the sigmoid colon without surrounding inflammation. This is nonspecific and may reflect incomplete distention. Correlate for any clinical signs or symptoms of colitis. Electronically Signed   By: TKerby MoorsM.D.   On: 07/22/2020 05:30    Scheduled Meds: . [START ON 07/23/2020] enoxaparin (LOVENOX) injection  40 mg Subcutaneous Q24H  . estradiol  1 mg Per Tube Daily  . feeding supplement (PROSource TF)  45 mL Per Tube Daily  . free water  150 mL Per Tube Q4H  . mometasone-formoterol  2 puff Inhalation BID  . multivitamin with minerals  1 tablet Per Tube Daily  . pantoprazole (PROTONIX) IV  40 mg Intravenous Q12H  . polyethylene glycol  17 g Per Tube Daily  . potassium chloride  20 mEq Per Tube Daily  . sodium chloride flush  3 mL Intravenous Q12H   Continuous Infusions: . sodium chloride    . feeding supplement (VITAL 1.5 CAL) 1,000 mL (07/21/20 0035)  . lactated ringers 50 mL/hr at 07/22/20 0757  . piperacillin-tazobactam       LOS: 12 days   Time spent: 35 minutes   Kaycie Pegues RLoann Quill MD [facility]  If 7PM-7AM, please contact night-coverage [email or web address]  07/22/2020, 1:57 PM

## 2020-07-22 NOTE — Op Note (Signed)
[facility] Patient Name: Tricia Brown Procedure Date : 07/22/2020 MRN: [id number] Attending MD: Justice Britain , MD Date of Birth: [birth year] CSN: [id number] Age: 44 Admit Type: Inpatient Procedure:                Upper EUS Indications:              Pancreatic cyst on CT scan, Acute pancreatitis,                            Pancreatic cyst, Pancreatic necrosis Providers:                Justice Britain, MD, Jeanella Cara, RN,                            Cletis Athens, Technician Referring MD:             Clarene Essex, MD, Medical Service Medicines:                General Anesthesia, Zosyn not administered as it                            was not due yet Complications:            No immediate complications. Estimated Blood Loss:     Estimated blood loss was minimal. Procedure:                Pre-Anesthesia Assessment:                           - Prior to the procedure, a History and Physical                            was performed, and patient medications and                            allergies were reviewed. The patient's tolerance of                            previous anesthesia was also reviewed. The risks                            and benefits of the procedure and the sedation                            options and risks were discussed with the patient.                            All questions were answered, and informed consent                            was obtained. Prior Anticoagulants: The patient has                            taken Lovenox (enoxaparin), last dose was 2 days  prior to procedure. ASA Grade Assessment: III - A                            patient with severe systemic disease. After                            reviewing the risks and benefits, the patient was                            deemed in satisfactory condition to undergo the                            procedure.                           After obtaining  informed consent, the endoscope was                            passed under direct vision. Throughout the                            procedure, the patient's blood pressure, pulse, and                            oxygen saturations were monitored continuously. The                            [id number] ([id number]) Olympus therapeutic                            gastroscope was introduced through the mouth, and                            advanced to the second part of duodenum. After                            obtaining informed consent, the endoscope was                            passed under direct vision. Throughout the                            procedure, the patient's blood pressure, pulse, and                            oxygen saturations were monitored continuously. The                            TJF- Q180V ([id number]) Olympus duodenoscope was                            introduced through the mouth, and advanced to the  second part of duodenum. The [id number] ([id number])                            Olympus Linear EUS was introduced through the                            mouth, and advanced to the stomach for ultrasound                            examination. The upper EUS was accomplished without                            difficulty. The patient tolerated the procedure. Scope In: Scope Out: Findings:      ENDOSCOPIC FINDING: :      Diffuse, yellow plaques were found in the entire esophagus. Biopsies       were taken with a cold forceps for histology to rule out Candida.      The Z-line was regular and was found 40 cm from the incisors.      Patchy mildly erythematous mucosa without bleeding was found in the       entire examined stomach.      Extrinsic impression noted on the posterior wall of the stomach.      No other gross lesions were noted in the entire examined stomach.       Biopsies were taken with a cold forceps for histology and Helicobacter        pylori testing.      No gross lesions were noted in the duodenal bulb, in the first portion       of the duodenum, in the second portion of the duodenum and in the major       papilla.      ENDOSONOGRAPHIC FINDING: :      Anechoic lesions suggestive of multiple cysts were identified in the       pancreatic body and pancreatic tail. The first cyst measured 102 mm by       90 mm. The second cyst measured 18 mm by 12 mm. The third cyst measured       14 mm by 8 mm. There was no associated masses. The decision was made to       create a cystogastrostomy using the AXIOS stent system to the largest       cyst causing intrinsic impression. Once an appropriate position in the       stomach was identified (approximately 55 cm from the incisors), the       common wall between the stomach and the cyst was interrogated utilizing       color Doppler imaging to identify interposed vessels. The stomach wall       (5 mm) and the cyst were punctured under endosonographic guidance via       the AXIOS stent and electrocautery device after applying current to the       cautery tip. The AXIOS device was advanced into the cyst, and 20 mm x 10       mm stent was placed with the flanges in close approximation to the walls       of the cyst and the stomach through the cystogastrostomy. The stent was       successfully placed. Suction via Endoscope  was performed and removed 500       cc of fluid (some of this fluid was sent for culture and gram stain).       The cyst was filled with fluid and necrotic tissue that was pasty and       adherent to the cyst wall. Several intubations of the cyst were       performed and lavage was performed. A 7 cm 10 Fr double pigtail stent       and a 5 cm 10 Fr double pigtail stent were placed into the pseudocyst       through the AXIOS cystogastrostomy. The stent was successfully placed.      The celiac region was visualized. Impression:               EGD Impression:                            - Esophageal plaques were found, suspicious for                            candidiasis. Biopsied.                           - Z-line regular, 40 cm from the incisors.                           - Erythematous mucosa in the stomach.                           - Extrinsic compression in the posterior wall of                            the stomach.                           - No gross lesions in the stomach. Biopsied.                           - No gross lesions in the duodenal bulb, in the                            first portion of the duodenum, in the second                            portion of the duodenum and in the major papilla.                           EUS Impression:                           - Multiple cystic lesions were seen in the                            pancreatic body and pancreatic tail. Tissue has not                            been  obtained. However, the endosonographic                            appearance is consistent with a pancreatic                            pseudocyst. Stented.                           - AXIOS Cystogastrostomy was performed. Plastic                            double-pigtail stents placed to decrease risk of                            necroma closure of stent. Recommendation:           - The patient will be observed post-procedure,                            until all discharge criteria are met.                           - Return patient to hospital ward for ongoing care.                           - Clear liquid diet today and if in 24 hours doing                            well then advance to clear liquids and see how she                            does from there. Monitor calorie counts in case she                            need Cortrak replaced.                           - Observe patient's clinical course.                           - Await cytology results and await path results.                           - Continue antibiotics  until stent is removed                            (eventually transition to PO antibiotics based on                            how she does.                           - Depending on how patient is doing over the course  of the next few days we will decide whether attempt                            at Necrosectomy will be required vs allowing                            gastric acid to work and settle in the necrosis in                            effort of treatment.                           - No Chemical VTE PPx for at least 48 hours.                           - Stop PPI therapy for now.                           - Consider starting Nystatin Swish/swallow while                            awaiting results for likely Candida findings on                            esophagus.                           - The findings and recommendations were discussed                            with the patient.                           - The findings and recommendations were discussed                            with the patient's family.                           - The findings and recommendations were discussed                            with the referring physician. Procedure Code(s):        --- Professional ---                           [id number], Esophagogastroduodenoscopy, flexible,                            transoral; with transmural drainage of pseudocyst                            (includes placement of transmural drainage                            catheter[s]/stent[s], when performed,  and                            endoscopic ultrasound, when performed)                           43237, Esophagogastroduodenoscopy, flexible,                            transoral; with endoscopic ultrasound examination                            limited to the esophagus, stomach or duodenum, and                            adjacent structures                           48999, Unlisted procedure,  pancreas Diagnosis Code(s):        --- Professional ---                           K22.9, Disease of esophagus, unspecified                           K31.89, Other diseases of stomach and duodenum                           K86.2, Cyst of pancreas                           K85.90, Acute pancreatitis without necrosis or                            infection, unspecified                           K86.89, Other specified diseases of pancreas CPT copyright 2019 [employer] Association. All rights reserved. The codes documented in this report are preliminary and upon coder review may  be revised to meet current compliance requirements. Justice Britain, MD 07/22/2020 5:38:01 PM Number of Addenda: 0

## 2020-07-22 NOTE — Anesthesia Preprocedure Evaluation (Signed)
Anesthesia Evaluation  Patient identified by MRN, date of birth, ID band Patient awake    Reviewed: Allergy & Precautions, H&P , NPO status , Patient's Chart, lab work & pertinent test results  Airway Mallampati: II   Neck ROM: full    Dental   Pulmonary Current Smoker,    breath sounds clear to auscultation       Cardiovascular negative cardio ROS   Rhythm:regular Rate:Normal     Neuro/Psych    GI/Hepatic (+)     substance abuse  , Pancreatic cyst   Endo/Other    Renal/GU      Musculoskeletal   Abdominal   Peds  Hematology  (+) Blood dyscrasia, anemia ,   Anesthesia Other Findings   Reproductive/Obstetrics                             Anesthesia Physical Anesthesia Plan  ASA: III  Anesthesia Plan: General   Post-op Pain Management:    Induction: Intravenous  PONV Risk Score and Plan: 2 and Ondansetron, Dexamethasone, Midazolam and Treatment may vary due to age or medical condition  Airway Management Planned: Oral ETT  Additional Equipment:   Intra-op Plan:   Post-operative Plan: Extubation in OR  Informed Consent: I have reviewed the patients History and Physical, chart, labs and discussed the procedure including the risks, benefits and alternatives for the proposed anesthesia with the patient or authorized representative who has indicated his/her understanding and acceptance.       Plan Discussed with: CRNA, Anesthesiologist and Surgeon  Anesthesia Plan Comments:         Anesthesia Quick Evaluation

## 2020-07-22 NOTE — Interval H&P Note (Signed)
History and Physical Interval Note:  07/22/2020 4:04 PM  Tricia Brown  has presented today for surgery, with the diagnosis of Pancreatic Cyst.  The various methods of treatment have been discussed with the patient and family. After consideration of risks, benefits and other options for treatment, the patient has consented to  Procedure(s): ESOPHAGOGASTRODUODENOSCOPY (EGD) WITH PROPOFOL (N/A) UPPER ENDOSCOPIC ULTRASOUND (EUS) LINEAR (N/A) CYST ENTEROSTOMY (N/A) as a surgical intervention.  The patient's history has been reviewed, patient examined, no change in status, stable for surgery.  I have reviewed the patient's chart and labs.  Questions were answered to the patient's satisfaction.    The risks of an EUS, including intestinal perforation, bleeding, infection, aspiration, and medication effects were discussed.  When a cystgastrostomy/cystenterostomy is performed as part of the EUS, there is an additional risk of pancreatitis at the rate of about 1-2%.  It was explained that procedure related pancreatitis is typically mild, although at times it can be severe and even life threatening.    [employer]

## 2020-07-22 NOTE — Anesthesia Procedure Notes (Signed)
Procedure Name: Intubation Date/Time: 07/22/2020 4:23 PM Performed by: Griffin Dakin, CRNA Pre-anesthesia Checklist: Patient identified, Emergency Drugs available, Suction available and Patient being monitored Patient Re-evaluated:Patient Re-evaluated prior to induction Oxygen Delivery Method: Circle system utilized Preoxygenation: Pre-oxygenation with 100% oxygen Induction Type: IV induction and Rapid sequence Laryngoscope Size: Mac and 4 Grade View: Grade I Tube type: Oral Tube size: 7.0 mm Number of attempts: 1 Airway Equipment and Method: Stylet Placement Confirmation: ETT inserted through vocal cords under direct vision,  positive ETCO2 and breath sounds checked- equal and bilateral Secured at: 23 cm Tube secured with: Tape Dental Injury: Teeth and Oropharynx as per pre-operative assessment

## 2020-07-22 NOTE — Transfer of Care (Signed)
Immediate Anesthesia Transfer of Care Note  Patient: Tricia Brown  Procedure(s) Performed: ESOPHAGOGASTRODUODENOSCOPY (EGD) WITH PROPOFOL (N/A ) UPPER ENDOSCOPIC ULTRASOUND (EUS) LINEAR (N/A ) CYST ENTEROSTOMY (N/A ) BIOPSY PANCREATIC STENT PLACEMENT  Patient Location: PACU  Anesthesia Type:General  Level of Consciousness: awake, alert  and oriented  Airway & Oxygen Therapy: Patient Spontanous Breathing  Post-op Assessment: Report given to RN and Post -op Vital signs reviewed and stable  Post vital signs: Reviewed and stable  Last Vitals:  Vitals Value Taken Time  BP 130/76 07/22/20 1734  Temp    Pulse 124 07/22/20 1738  Resp 19 07/22/20 1738  SpO2 99 % 07/22/20 1738  Vitals shown include unvalidated device data.  Last Pain:  Vitals:   07/22/20 1506  TempSrc: Oral  PainSc: 6       Patients Stated Pain Goal: 3 (07/22/20 0428)  Complications: No complications documented.

## 2020-07-22 NOTE — H&P (View-Only) (Signed)
[facility] Progress Note  Tricia Brown 44 y.o. [birth year]  CC:  Pancreatitis with pseudocysts  Subjective: Patient reports continued abdominal pain, as well as nausea.  Has not had a bowel movement since Friday.  ROS : Review of Systems  Cardiovascular: Negative for chest pain and palpitations.  Gastrointestinal: Positive for abdominal pain, heartburn and nausea. Negative for blood in stool, constipation, diarrhea, melena and vomiting.    Objective: Vital signs in last 24 hours: Vitals:   07/21/20 2043 07/22/20 0349  BP:  108/78  Pulse:  98  Resp:  16  Temp:  98.1 F (36.7 C)  SpO2: 97% 98%    Physical Exam:  General:  Alert, oriented, cooperative, no distress; NGT in place  Head:  Normocephalic, without obvious abnormality, atraumatic  Eyes:  Anicteric sclera, EOMs intact  Lungs:   Clear to auscultation bilaterally, respirations unlabored  Heart:  Mildly tachycardic with regular rhythm, S1, S2 normal  Abdomen:   Soft, mildly distended, diffuse moderate tenderness, sluggish bowel sounds   Extremities: Extremities normal, no edema  Pulses: 2+ and symmetric    Lab Results: Recent Labs    07/21/20 0411 07/22/20 0119  NA 139 135  K 4.4 4.7  CL 100 97*  CO2 30 28  GLUCOSE 102* 82  BUN <5* 8  CREATININE 0.73 0.74  CALCIUM 9.0 8.9   Recent Labs    07/22/20 0119  AST 11*  ALT 8  ALKPHOS 140*  BILITOT 0.6  PROT 6.4*  ALBUMIN 2.3*   Recent Labs    07/21/20 0411 07/22/20 0119  WBC 10.8* 12.5*  HGB 9.9* 10.1*  HCT 32.0* 32.3*  MCV 100.0 98.2  PLT 604* 672*   Recent Labs    07/22/20 0119  LABPROT 13.3  INR 1.1      Assessment: Acute pancreatitis with pseudocyst formation. Worsening pain with advancement to full liquid diet. Unclear etiology. Post-cholecystectomy, normal lipids. She denies frequent or heavy alcohol use.  ?passed de novo CBD stone vs. idiopathic -CT 10/25 revealed interval development of several pseudocysts. 8.7 x 6.3  cm organized cyst/pseudocyst which has significant mass effect on the body of the stomach.  Improved appearance of the pancreas. Regressing inflammatory changes and no evidence of pancreatic necrosis. -CT 10/31: Interval increase in size of large, well-organized pseudo cyst within the lesser sac which extends along the posterior wall of the stomach. The smaller pseudocysts within and around the tail of pancreas are unchanged. -Normal renal function: BUN 8/ Cr 0.74 -WBCs elevated to 12.5, as compared to 10.8 yesterday -Mildly elevated ALP, otherwise normal LFTs: T. Bili 0.6/ AST 11/ ALT 8/ ALP 140  -Triglycerides normal (71) as of 10/20  Plan: Cyst gastrostomy today with Dr. Meridee Score.  Continue supportive care.   [facility] will follow.  Edrick Kins PA-C 07/22/2020, 9:32 AM  Contact #  [phone]

## 2020-07-23 DIAGNOSIS — K2970 Gastritis, unspecified, without bleeding: Secondary | ICD-10-CM

## 2020-07-23 DIAGNOSIS — K859 Acute pancreatitis without necrosis or infection, unspecified: Secondary | ICD-10-CM | POA: Diagnosis not present

## 2020-07-23 DIAGNOSIS — K2990 Gastroduodenitis, unspecified, without bleeding: Secondary | ICD-10-CM

## 2020-07-23 DIAGNOSIS — K863 Pseudocyst of pancreas: Secondary | ICD-10-CM

## 2020-07-23 LAB — COMPREHENSIVE METABOLIC PANEL
ALT: 9 U/L (ref 0–44)
AST: 15 U/L (ref 15–41)
Albumin: 2.4 g/dL — ABNORMAL LOW (ref 3.5–5.0)
Alkaline Phosphatase: 175 U/L — ABNORMAL HIGH (ref 38–126)
Anion gap: 11 (ref 5–15)
BUN: 11 mg/dL (ref 6–20)
CO2: 26 mmol/L (ref 22–32)
Calcium: 9.3 mg/dL (ref 8.9–10.3)
Chloride: 96 mmol/L — ABNORMAL LOW (ref 98–111)
Creatinine, Ser: 0.88 mg/dL (ref 0.44–1.00)
GFR, Estimated: >60 mL/min (ref >60)
Glucose, Bld: 182 mg/dL — ABNORMAL HIGH (ref 70–99)
Potassium: 4.6 mmol/L (ref 3.5–5.1)
Sodium: 133 mmol/L — ABNORMAL LOW (ref 135–145)
Total Bilirubin: 0.5 mg/dL (ref 0.3–1.2)
Total Protein: 6.9 g/dL (ref 6.5–8.1)

## 2020-07-23 LAB — CBC WITH DIFFERENTIAL/PLATELET
Abs Immature Granulocytes: 0.06 10*3/uL (ref 0.00–0.07)
Basophils Absolute: 0 10*3/uL (ref 0.0–0.1)
Basophils Relative: 0 %
Eosinophils Absolute: 0 10*3/uL (ref 0.0–0.5)
Eosinophils Relative: 0 %
HCT: 32.8 % — ABNORMAL LOW (ref 36.0–46.0)
Hemoglobin: 10.3 g/dL — ABNORMAL LOW (ref 12.0–15.0)
Immature Granulocytes: 1 %
Lymphocytes Relative: 5 %
Lymphs Abs: 0.6 10*3/uL — ABNORMAL LOW (ref 0.7–4.0)
MCH: 30.4 pg (ref 26.0–34.0)
MCHC: 31.4 g/dL (ref 30.0–36.0)
MCV: 96.8 fL (ref 80.0–100.0)
Monocytes Absolute: 0.1 10*3/uL (ref 0.1–1.0)
Monocytes Relative: 1 %
Neutro Abs: 10.8 10*3/uL — ABNORMAL HIGH (ref 1.7–7.7)
Neutrophils Relative %: 93 %
Platelets: 759 10*3/uL — ABNORMAL HIGH (ref 150–400)
RBC: 3.39 MIL/uL — ABNORMAL LOW (ref 3.87–5.11)
RDW: 13.1 % (ref 11.5–15.5)
WBC: 11.6 10*3/uL — ABNORMAL HIGH (ref 4.0–10.5)
nRBC: 0 % (ref 0.0–0.2)

## 2020-07-23 LAB — GLUCOSE, CAPILLARY
Glucose-Capillary: 135 mg/dL — ABNORMAL HIGH (ref 70–99)
Glucose-Capillary: 163 mg/dL — ABNORMAL HIGH (ref 70–99)
Glucose-Capillary: 172 mg/dL — ABNORMAL HIGH (ref 70–99)
Glucose-Capillary: 97 mg/dL (ref 70–99)

## 2020-07-23 LAB — TROPONIN I (HIGH SENSITIVITY): Troponin I (High Sensitivity): 7 ng/L (ref <18)

## 2020-07-23 MED ORDER — ADULT MULTIVITAMIN W/MINERALS CH
1.0000 | ORAL_TABLET | Freq: Every day | ORAL | Status: DC
  Administered 2020-07-24 – 2020-08-07 (×15): 1 via ORAL
  Filled 2020-07-23 (×15): qty 1

## 2020-07-23 MED ORDER — ESTRADIOL 1 MG PO TABS
1.0000 mg | ORAL_TABLET | Freq: Every day | ORAL | Status: DC
  Administered 2020-07-24 – 2020-08-07 (×15): 1 mg via ORAL
  Filled 2020-07-23 (×17): qty 1

## 2020-07-23 MED ORDER — DICYCLOMINE HCL 10 MG/5ML PO SOLN: 10.0000 mg | Freq: Four times a day (QID) | ORAL | Status: DC | PRN

## 2020-07-23 MED ORDER — LOPERAMIDE HCL 1 MG/7.5ML PO SUSP
2.0000 mg | ORAL | Status: DC | PRN
  Administered 2020-07-23 – 2020-07-24 (×2): 2 mg via ORAL
  Filled 2020-07-23 (×2): qty 15

## 2020-07-23 MED ORDER — POTASSIUM CHLORIDE CRYS ER 20 MEQ PO TBCR
20.0000 meq | EXTENDED_RELEASE_TABLET | Freq: Every day | ORAL | Status: DC
  Administered 2020-07-24 – 2020-07-31 (×8): 20 meq via ORAL
  Filled 2020-07-23 (×8): qty 1

## 2020-07-23 MED ORDER — ONDANSETRON HCL 4 MG PO TABS
4.0000 mg | ORAL_TABLET | Freq: Four times a day (QID) | ORAL | Status: DC | PRN
  Administered 2020-07-25 – 2020-08-06 (×18): 4 mg via ORAL
  Filled 2020-07-23 (×18): qty 1

## 2020-07-23 MED ORDER — NYSTATIN 100000 UNIT/ML MT SUSP
5.0000 mL | Freq: Four times a day (QID) | OROMUCOSAL | Status: DC
  Administered 2020-07-23 – 2020-07-25 (×8): 500000 [IU] via ORAL
  Filled 2020-07-23 (×8): qty 5

## 2020-07-23 MED ORDER — ONDANSETRON HCL 4 MG/2ML IJ SOLN
4.0000 mg | Freq: Four times a day (QID) | INTRAMUSCULAR | Status: DC | PRN
  Administered 2020-07-24 – 2020-08-07 (×25): 4 mg via INTRAVENOUS
  Filled 2020-07-23 (×25): qty 2

## 2020-07-23 MED ORDER — DICYCLOMINE HCL 10 MG PO CAPS
10.0000 mg | ORAL_CAPSULE | Freq: Four times a day (QID) | ORAL | Status: DC | PRN
  Administered 2020-07-28 – 2020-08-02 (×5): 10 mg via ORAL
  Filled 2020-07-23 (×5): qty 1

## 2020-07-23 MED ORDER — OXYCODONE HCL 5 MG PO TABS
10.0000 mg | ORAL_TABLET | ORAL | Status: DC | PRN
  Administered 2020-07-23 – 2020-08-04 (×58): 10 mg via ORAL
  Filled 2020-07-23 (×58): qty 2

## 2020-07-23 NOTE — Progress Notes (Signed)
[facility] Progress Note  Tricia Brown 44 y.o. [birth year]  CC:  Pancreatitis with pseudocysts s/p cyst gastrostomy 11/1  Subjective: Patient reports feeling better today.  Reports continued abdominal pain but states her pain is better than it was prior to cyst gastrostomy.  Had some nausea with clear liquids this morning but denies vomiting.  Had a diarrheal stool today.  Patient developed acute abdominal pain and chest post-procedure yesterday, which has since resolved.  Chest x-ray and abdominal x-ray yesterday were unremarkable.  ROS : Review of Systems  Cardiovascular: Negative for chest pain and palpitations.  Gastrointestinal: Positive for abdominal pain, diarrhea, heartburn and nausea. Negative for blood in stool, constipation, melena and vomiting.   Objective: Vital signs in last 24 hours: Vitals:   07/23/20 0427 07/23/20 0800  BP: 114/75 120/72  Pulse: 78 93  Resp: 16 18  Temp: 97.6 F (36.4 C) 98 F (36.7 C)  SpO2: 98% 100%    Physical Exam:  General:  Alert, oriented, cooperative, no acute distress  Head:  Normocephalic, without obvious abnormality, atraumatic  Eyes:  Anicteric sclera, EOM's intact,   Lungs:   Clear to auscultation bilaterally, respirations unlabored  Heart:  Regular rate and rhythm, S1, S2 normal  Abdomen:   Soft with moderate epigastric tenderness with guarding, bowel sounds active all four quadrants, no peritoneal signs   Extremities: Extremities normal, atraumatic, no  edema  Pulses: 2+ and symmetric    Lab Results: Recent Labs    07/22/20 0119 07/23/20 0031  NA 135 133*  K 4.7 4.6  CL 97* 96*  CO2 28 26  GLUCOSE 82 182*  BUN 8 11  CREATININE 0.74 0.88  CALCIUM 8.9 9.3   Recent Labs    07/22/20 0119 07/23/20 0031  AST 11* 15  ALT 8 9  ALKPHOS 140* 175*  BILITOT 0.6 0.5  PROT 6.4* 6.9  ALBUMIN 2.3* 2.4*   Recent Labs    07/22/20 2020 07/23/20 0031  WBC 14.4* 11.6*  NEUTROABS  --  10.8*  HGB 11.0* 10.3*   HCT 34.9* 32.8*  MCV 98.9 96.8  PLT 748* 759*   Recent Labs    07/22/20 0119  LABPROT 13.3  INR 1.1   Assessment: Pancreatitis with pseudocyst formation s/p cyst gastrostomy 11/1 -AXIOS Cystogastrostomy was performed 11/1. Plastic double-pigtail stents placed to decrease risk of necroma closure of stent. -Improvement in pain post-procedure -Normal renal function: BUN 11/ Cr 0.88 -WBCs elevated to 11.6, improved on 14.4 yesterday -Elevated ALP, otherwise normal LFTs: T. Bili 0.5/ AST 15/ ALT 9/ ALP 175  Esophageal plaques seen on endoscopy yesterday, path pending  Plan: Continue Zosyn.  Clear liquid diet today.  Continue supportive care.  [facility] will follow.   Edrick Kins PA-C 07/23/2020, 9:23 AM  Contact #  [phone]

## 2020-07-23 NOTE — Plan of Care (Signed)
  Problem: Education: Goal: Knowledge of General Education information will improve Description: Including pain rating scale, medication(s)/side effects and non-pharmacologic comfort measures Outcome: Progressing   Problem: Health Behavior/Discharge Planning: Goal: Ability to manage health-related needs will improve Outcome: Progressing   Problem: Clinical Measurements: Goal: Ability to maintain clinical measurements within normal limits will improve Outcome: Progressing Goal: Will remain free from infection Outcome: Progressing Goal: Diagnostic test results will improve Outcome: Progressing Goal: Respiratory complications will improve Outcome: Progressing Goal: Cardiovascular complication will be avoided Outcome: Progressing   Problem: Elimination: Goal: Will not experience complications related to bowel motility Outcome: Progressing Goal: Will not experience complications related to urinary retention Outcome: Progressing   Problem: Pain Managment: Goal: General experience of comfort will improve Outcome: Progressing   

## 2020-07-23 NOTE — Anesthesia Postprocedure Evaluation (Signed)
Anesthesia Post Note  Patient: Delicia Berens  Procedure(s) Performed: ESOPHAGOGASTRODUODENOSCOPY (EGD) WITH PROPOFOL (N/A ) UPPER ENDOSCOPIC ULTRASOUND (EUS) LINEAR (N/A ) BIOPSY CYST GASTROSTOMY     Patient location during evaluation: PACU Anesthesia Type: General Level of consciousness: awake and alert Pain management: pain level controlled Vital Signs Assessment: post-procedure vital signs reviewed and stable Respiratory status: spontaneous breathing, nonlabored ventilation, respiratory function stable and patient connected to nasal cannula oxygen Cardiovascular status: blood pressure returned to baseline and stable Postop Assessment: no apparent nausea or vomiting Anesthetic complications: no   No complications documented.  Last Vitals:  Vitals:   07/23/20 0800 07/23/20 1208  BP: 120/72 114/86  Pulse: 93 96  Resp: 18 19  Temp: 36.7 C 36.8 C  SpO2: 100%     Last Pain:  Vitals:   07/23/20 1208  TempSrc: Oral  PainSc:                  Kasie Leccese S

## 2020-07-23 NOTE — Progress Notes (Signed)
PROGRESS NOTE    Tricia Brown  MWU:[id number] DOB: 1976-05-02 DOA: 07/10/2020 PCP: Lorelee Market, MD   Brief Narrative:  44 y.o.femalewith medical history significant ofendometriosis status post diagnostic laparoscopy and hysterectomy, IBS-D, GERD, status post cholecystectomy presents with abdominal pain x4 days, epigastric and left upper quadrant with associated nausea, vomiting and diarrhea, pain rated 4 out of 10 and when flares of up to 8 out of 10 with certain movement and following meals, not improved by Tums and daily PPI at home.  Drinks alcohol about sixpack on occasion on weekends. In the ED vitals stable labs with hypokalemia 3.0 alk phos 230, leukocytosis 14 K lipase 151 troponin x2 - UA normal CT abdomen showed pancreatitis with fluid collection consistent with pseudocyst reported as "stranding noted around the pancreatic body and tail compatible with acute pancreatitis. Adjacent fluid collections, the largest 3.8 cm most compatible with acute fluid collections and developing pseudocysts. Secondary reactive inflammation of the adjacent gastric wall. Small to moderate free fluid in the pelvis" Patient was given IV fluids pain medication Zofran and admission was requested. GI was consulted.  Had MRCP no evidence of choledocholithiasis. Patient is  being treated for acute pancreatitis, possibly passed CBD stone. underwent repeat CTA 10/25-that showed multiple pseudocyst pressing on the stomach-largest being 8.7 cm with significant mass-effect on the gastric body  Assessment & Plan:  Acute pancreatitis pseudocyst formation: -Status post cyst gastrostomy on 07/22/2020-plastic double-pigtail stents placed. -Continue aggressive IV hydration and pain management w/ IV Dilaudid oxy, bentyl -Tolerating clear liquid diet today. -Hold Protonix -Continue Zosyn. Remained afebrile, leukocytosis: Improving from 14.4-11.6 this morning. Liver enzymes: AST, ALT: WNL, alkaline phosphatase:  175. -Appreciate GI help  Esophageal plaques: Suspicious for candidiasis. - Pathology is pending. Hold PPI. -Start on nystatin swish and swallow while awaiting results for pathology.  Fluid overload with leg edema and shortness of breath: Improved after IV Lasix -Continue IV hydration and monitor signs of fluid overload  Loose stool intermittently: Continue IV fluids.    Hypokalemia: Resolved  Hypoalbuminemia in the setting of acute pancreatitis.  Albumin: 2.4.  Anemia of chronic disease hemoglobin ranging 9 to 11 g.   -No signs of active bleeding.  Monitor H&H closely.  Thrombocytosis: Platelet: 672 -Likely reactive.  Continue to monitor  FEN: -Monitor calorie counts. Tolerating clear liquid diet.  Mild metabolic acidosis/dehydration due to #1.  Resolved. Chronic back pain, controlled, continue pain management Depression: Her mood is stable and cheerful. Leukocytosis : Resolved  DVT prophylaxis: SCD, no chemical anticoagulation as per GI recommendations.  Code Status: Full code Family Communication:  None present at bedside.  Plan of care discussed with patient in length and he verbalized understanding and agreed with it. Disposition Plan: Home after the procedure  Consultants:   GI  Procedures:   As above  Antimicrobials:   Zosyn  Status is: Inpatient   Dispo: The patient is from: Home              Anticipated d/c is to: Home              Anticipated d/c date is: 07/24/2020              Patient currently not medically stable for the discharge    Subjective: Patient seen and examined. Very pleasant this morning. Tells me that she is doing much better now however she continues to have loose stool and epigastric pain but is improving.  objective: Vitals:   07/22/20 1935 07/23/20 0138 07/23/20 7253  07/23/20 0800  BP: 118/76 108/68 114/75 120/72  Pulse: 93 89 78 93  Resp: $Remo'19 17 16 18  'wYjNr$ Temp: 98.1 F (36.7 C) 97.7 F (36.5 C) 97.6 F (36.4 C) 98 F  (36.7 C)  TempSrc: Axillary Oral Oral Oral  SpO2: 98% 97% 98% 100%  Weight:      Height:        Intake/Output Summary (Last 24 hours) at 07/23/2020 1050 Last data filed at 07/23/2020 0827 Gross per 24 hour  Intake 860 ml  Output --  Net 860 ml   Filed Weights   07/17/20 1500 07/22/20 1506  Weight: 63.5 kg 63.5 kg   General exam: Appears calm and comfortable Respiratory system: Clear to auscultation. Respiratory effort normal. Cardiovascular system: S1 & S2 heard, RRR. No JVD, murmurs, rubs, gallops or clicks. No pedal edema. Gastrointestinal system: Soft, mildly distended, epigastric tenderness positive, no guarding, no rigidity, bowel sounds positive. Central nervous system: Alert and oriented. No focal neurological deficits. Extremities: Symmetric 5 x 5 power. Skin: No rashes, lesions or ulcers. Psychiatry: Judgement and insight appear normal. Mood & affect appropriate.     Data Reviewed: I have personally reviewed following labs and imaging studies  CBC: Recent Labs  Lab 07/20/20 0207 07/21/20 0411 07/22/20 0119 07/22/20 2020 07/23/20 0031  WBC 9.9 10.8* 12.5* 14.4* 11.6*  NEUTROABS  --   --   --   --  10.8*  HGB 9.3* 9.9* 10.1* 11.0* 10.3*  HCT 29.6* 32.0* 32.3* 34.9* 32.8*  MCV 99.0 100.0 98.2 98.9 96.8  PLT 527* 604* 672* 748* 557*   Basic Metabolic Panel: Recent Labs  Lab 07/19/20 0358 07/20/20 0207 07/21/20 0411 07/22/20 0119 07/23/20 0031  NA 140 141 139 135 133*  K 4.0 4.0 4.4 4.7 4.6  CL 99 101 100 97* 96*  CO2 $Re'29 30 30 28 26  'hxp$ GLUCOSE 107* 97 102* 82 182*  BUN <5* <5* <5* 8 11  CREATININE 0.72 0.49 0.73 0.74 0.88  CALCIUM 8.4* 9.0 9.0 8.9 9.3   GFR: Estimated Creatinine Clearance: 79.3 mL/min (by C-G formula based on SCr of 0.88 mg/dL). Liver Function Tests: Recent Labs  Lab 07/17/20 0450 07/18/20 0144 07/22/20 0119 07/23/20 0031  AST 11* 12* 11* 15  ALT $Re'9 8 8 9  'CMK$ ALKPHOS 144* 150* 140* 175*  BILITOT 0.6 0.5 0.6 0.5  PROT 5.3* 5.6*  6.4* 6.9  ALBUMIN 2.2* 2.3* 2.3* 2.4*   Recent Labs  Lab 07/22/20 2020  LIPASE 92*  AMYLASE 154*   No results for input(s): AMMONIA in the last 168 hours. Coagulation Profile: Recent Labs  Lab 07/22/20 0119  INR 1.1   Cardiac Enzymes: No results for input(s): CKTOTAL, CKMB, CKMBINDEX, TROPONINI in the last 168 hours. BNP (last 3 results) No results for input(s): PROBNP in the last 8760 hours. HbA1C: No results for input(s): HGBA1C in the last 72 hours. CBG: Recent Labs  Lab 07/22/20 1153 07/22/20 2020 07/23/20 0011 07/23/20 0428 07/23/20 0803  GLUCAP 73 98 163* 135* 172*   Lipid Profile: No results for input(s): CHOL, HDL, LDLCALC, TRIG, CHOLHDL, LDLDIRECT in the last 72 hours. Thyroid Function Tests: No results for input(s): TSH, T4TOTAL, FREET4, T3FREE, THYROIDAB in the last 72 hours. Anemia Panel: No results for input(s): VITAMINB12, FOLATE, FERRITIN, TIBC, IRON, RETICCTPCT in the last 72 hours. Sepsis Labs: No results for input(s): PROCALCITON, LATICACIDVEN in the last 168 hours.  Recent Results (from the past 240 hour(s))  Anaerobic culture     Status: None (  Preliminary result)   Collection Time: 07/22/20  5:03 PM   Specimen: PATH GI biopsy; Body Fluid  Result Value Ref Range Status   Specimen Description FLUID  Final   Special Requests PANCREACTIC CYST FLUID SPEC A  Final   Gram Stain   Final    FEW WBC PRESENT, PREDOMINANTLY MONONUCLEAR FEW GRAM NEGATIVE RODS RARE GRAM POSITIVE COCCI IN CLUSTERS RARE GRAM POSITIVE RODS Performed at [facility], [address] [place], [place] [address]    Culture PENDING  Incomplete   Report Status PENDING  Incomplete      Radiology Studies: CT ABDOMEN PELVIS W WO CONTRAST  Result Date: 07/22/2020 CLINICAL DATA:  Follow-up pancreatitis. EXAM: CT ABDOMEN AND PELVIS WITHOUT AND WITH CONTRAST TECHNIQUE: Multidetector CT imaging of the abdomen and pelvis was performed following the standard protocol before and  following the bolus administration of intravenous contrast. CONTRAST:  OMNIPAQUE IOHEXOL 300 MG/ML  SOLN COMPARISON:  07/15/2020 the. FINDINGS: Lower chest: Subsegmental atelectasis identified within the lung bases. Hepatobiliary: No focal liver abnormality. Status post cholecystectomy. No biliary ductal dilatation. Pancreas: Stable to improved appearance of peripancreatic inflammation. Stable cystic lesion within tail of pancreas measures 1.4 cm, image 42/11. Adjacent cyst projecting off the tail of pancreas is again noted measuring 1.4 cm, unchanged. Between the posterior wall of the stomach in tail of pancreas there is a cystic structure measuring 3.2 cm, image 33/11. Previously this measured 4.1 cm. The large, well-organized cystic structure within the lesser sac between the pancreas, stomach and spleen measures 9.9 x 6.8 by 11.6 cm, image 67/11. Previously this measured 8.3 x 6.5 by 10.4 cm. No new fluid collections identified. Spleen: There is a small perisplenic collection extending along the medial aspect of the spleen which measures approximately 0.7 cm in thickness, image 20/11. No signs of splenic infarct. Adrenals/Urinary Tract: Normal appearance of the adrenal glands. The kidneys are unremarkable. No mass or hydronephrosis identified. Urinary bladder is unremarkable. Stomach/Bowel: The stomach appears nondistended. No significant small bowel wall thickening, inflammation or distension. The scratch set there is mild wall thickening involving the sigmoid colon without surrounding inflammation. This is nonspecific and may reflect incomplete distention. Vascular/Lymphatic: No significant vascular findings are present. The portal vein, portal venous confluence and splenic vein remain patent. No enlarged abdominal or pelvic lymph nodes. Reproductive: Status post hysterectomy. No adnexal masses. Other: Interval resolution pelvic ascites. Musculoskeletal: No acute or significant osseous findings.  IMPRESSION: 1. Stable to improved appearance of peripancreatic inflammation. 2. There is been interval increase in size of large, well-organized pseudo cyst within the lesser sac which extends along the posterior wall of the stomach. The smaller pseudocysts within and around the tail of pancreas are unchanged. 3. Interval resolution of pelvic ascites. 4. Mild wall thickening involving the sigmoid colon without surrounding inflammation. This is nonspecific and may reflect incomplete distention. Correlate for any clinical signs or symptoms of colitis. Electronically Signed   By: Signa Kell M.D.   On: 07/22/2020 05:30   DG Chest Port 1 View  Result Date: 07/22/2020 CLINICAL DATA:  Pancreatitis EXAM: PORTABLE CHEST 1 VIEW COMPARISON:  07/21/2020 FINDINGS: Single frontal view of the chest demonstrates an unremarkable cardiac silhouette. No airspace disease, effusion, or pneumothorax. No acute bony abnormalities. IMPRESSION: 1. No acute intrathoracic process. Electronically Signed   By: Sharlet Salina M.D.   On: 07/22/2020 19:18   DG Abd Portable 2V  Result Date: 07/22/2020 CLINICAL DATA:  Pancreatitis, cystogastrostomy, pain EXAM: PORTABLE ABDOMEN -  2 VIEW COMPARISON:  None. FINDINGS: Supine and left lateral decubitus views of the abdomen and pelvis are obtained. Postsurgical changes are seen within the left mid abdomen compatible with pseudocyst drainage. No masses or abnormal calcifications. No bowel obstruction or ileus. No free gas. No acute bony abnormalities. IMPRESSION: 1. Postsurgical changes from internal decompression of pancreatic pseudocyst. 2. No evidence of bowel obstruction or perforation. Electronically Signed   By: Randa Ngo M.D.   On: 07/22/2020 19:20    Scheduled Meds: . estradiol  1 mg Per Tube Daily  . feeding supplement (PROSource TF)  45 mL Per Tube Daily  . free water  150 mL Per Tube Q4H  . mometasone-formoterol  2 puff Inhalation BID  . multivitamin with minerals  1  tablet Per Tube Daily  . pantoprazole (PROTONIX) IV  40 mg Intravenous Q12H  . potassium chloride  20 mEq Per Tube Daily  . sodium chloride flush  3 mL Intravenous Q12H   Continuous Infusions: . feeding supplement (VITAL 1.5 CAL) 1,000 mL (07/21/20 0035)  . lactated ringers 50 mL/hr at 07/22/20 1613  . piperacillin-tazobactam (ZOSYN)  IV 3.375 g (07/23/20 0620)     LOS: 13 days   Time spent: 35 minutes   Elisheva Fallas Loann Quill, MD [facility]  If 7PM-7AM, please contact night-coverage [email or web address] 07/23/2020, 10:50 AM

## 2020-07-24 ENCOUNTER — Other Ambulatory Visit: Payer: Self-pay | Admitting: Physician Assistant

## 2020-07-24 ENCOUNTER — Encounter (HOSPITAL_COMMUNITY): Payer: Self-pay | Admitting: Gastroenterology

## 2020-07-24 DIAGNOSIS — K859 Acute pancreatitis without necrosis or infection, unspecified: Secondary | ICD-10-CM | POA: Diagnosis not present

## 2020-07-24 LAB — CBC WITH DIFFERENTIAL/PLATELET
Abs Immature Granulocytes: 0.08 10*3/uL — ABNORMAL HIGH (ref 0.00–0.07)
Basophils Absolute: 0.1 10*3/uL (ref 0.0–0.1)
Basophils Relative: 1 %
Eosinophils Absolute: 0.1 10*3/uL (ref 0.0–0.5)
Eosinophils Relative: 1 %
HCT: 29.2 % — ABNORMAL LOW (ref 36.0–46.0)
Hemoglobin: 9.3 g/dL — ABNORMAL LOW (ref 12.0–15.0)
Immature Granulocytes: 1 %
Lymphocytes Relative: 22 %
Lymphs Abs: 2.6 10*3/uL (ref 0.7–4.0)
MCH: 31.2 pg (ref 26.0–34.0)
MCHC: 31.8 g/dL (ref 30.0–36.0)
MCV: 98 fL (ref 80.0–100.0)
Monocytes Absolute: 1.1 10*3/uL — ABNORMAL HIGH (ref 0.1–1.0)
Monocytes Relative: 9 %
Neutro Abs: 8.1 10*3/uL — ABNORMAL HIGH (ref 1.7–7.7)
Neutrophils Relative %: 66 %
Platelets: 710 10*3/uL — ABNORMAL HIGH (ref 150–400)
RBC: 2.98 MIL/uL — ABNORMAL LOW (ref 3.87–5.11)
RDW: 13.1 % (ref 11.5–15.5)
WBC: 12.1 10*3/uL — ABNORMAL HIGH (ref 4.0–10.5)
nRBC: 0 % (ref 0.0–0.2)

## 2020-07-24 LAB — COMPREHENSIVE METABOLIC PANEL
ALT: 10 U/L (ref 0–44)
AST: 14 U/L — ABNORMAL LOW (ref 15–41)
Albumin: 2.4 g/dL — ABNORMAL LOW (ref 3.5–5.0)
Alkaline Phosphatase: 137 U/L — ABNORMAL HIGH (ref 38–126)
Anion gap: 9 (ref 5–15)
BUN: 8 mg/dL (ref 6–20)
CO2: 26 mmol/L (ref 22–32)
Calcium: 8.7 mg/dL — ABNORMAL LOW (ref 8.9–10.3)
Chloride: 103 mmol/L (ref 98–111)
Creatinine, Ser: 0.9 mg/dL (ref 0.44–1.00)
GFR, Estimated: >60 mL/min (ref >60)
Glucose, Bld: 109 mg/dL — ABNORMAL HIGH (ref 70–99)
Potassium: 4.1 mmol/L (ref 3.5–5.1)
Sodium: 138 mmol/L (ref 135–145)
Total Bilirubin: 0.1 mg/dL — ABNORMAL LOW (ref 0.3–1.2)
Total Protein: 6.1 g/dL — ABNORMAL LOW (ref 6.5–8.1)

## 2020-07-24 LAB — SURGICAL PATHOLOGY

## 2020-07-24 MED ORDER — PANCRELIPASE (LIP-PROT-AMYL) 36000-114000 UNITS PO CPEP
72000.0000 [IU] | ORAL_CAPSULE | Freq: Every evening | ORAL | Status: DC | PRN
  Administered 2020-07-25: 72000 [IU] via ORAL
  Filled 2020-07-24: qty 2

## 2020-07-24 MED ORDER — LOPERAMIDE HCL 2 MG PO CAPS
2.0000 mg | ORAL_CAPSULE | ORAL | Status: DC | PRN
  Administered 2020-07-24 – 2020-07-28 (×18): 2 mg via ORAL
  Filled 2020-07-24 (×18): qty 1

## 2020-07-24 MED ORDER — PANCRELIPASE (LIP-PROT-AMYL) 36000-114000 UNITS PO CPEP
72000.0000 [IU] | ORAL_CAPSULE | Freq: Three times a day (TID) | ORAL | Status: AC
  Administered 2020-07-24 – 2020-07-31 (×20): 72000 [IU] via ORAL
  Filled 2020-07-24 (×20): qty 2

## 2020-07-24 NOTE — Progress Notes (Signed)
[facility] Progress Note  Tricia Brown 44 y.o. [birth year]  CC:  Pancreatitis with pseudocysts s/p cyst gastrostomy 11/1  Subjective: Patient reports an episode of severe epigastric pain last night that awoke her from sleep.  She states the pain radiated up into her chest.  She continues to have pain this morning, but is has decreased. Continues to have some nausea but denies vomiting. Continued diarrhea.  ROS : Review of Systems  Cardiovascular: Negative for chest pain and palpitations.  Gastrointestinal: Positive for abdominal pain, diarrhea, heartburn and nausea. Negative for blood in stool, constipation, melena and vomiting.   Objective: Vital signs in last 24 hours: Vitals:   07/24/20 0610 07/24/20 0846  BP: 110/69   Pulse: 72   Resp: 18   Temp: 98.2 F (36.8 C)   SpO2: 100% 97%    Physical Exam:  General:  Alert, oriented, cooperative, no acute distress  Head:  Normocephalic, without obvious abnormality, atraumatic  Eyes:  Anicteric sclera, EOM's intact,   Lungs:   Clear to auscultation bilaterally, respirations unlabored  Heart:  Regular rate and rhythm, S1, S2 normal  Abdomen:   Soft, mildly distended, moderate epigastric and LUQ tenderness with guarding, bowel sounds active all four quadrants, no peritoneal signs   Extremities: Extremities normal, atraumatic, no  edema  Pulses: 2+ and symmetric    Lab Results: Recent Labs    07/23/20 0031 07/24/20 0059  NA 133* 138  K 4.6 4.1  CL 96* 103  CO2 26 26  GLUCOSE 182* 109*  BUN 11 8  CREATININE 0.88 0.90  CALCIUM 9.3 8.7*   Recent Labs    07/23/20 0031 07/24/20 0059  AST 15 14*  ALT 9 10  ALKPHOS 175* 137*  BILITOT 0.5 0.1*  PROT 6.9 6.1*  ALBUMIN 2.4* 2.4*   Recent Labs    07/23/20 0031 07/24/20 0059  WBC 11.6* 12.1*  NEUTROABS 10.8* 8.1*  HGB 10.3* 9.3*  HCT 32.8* 29.2*  MCV 96.8 98.0  PLT 759* 710*   Recent Labs    07/22/20 0119  LABPROT 13.3  INR 1.1    Assessment: Pancreatitis with pseudocyst formation s/p cyst gastrostomy 11/1 -Normal renal function: BUN 8/ Cr 0.90 -WBCs 12.1 today as compared to 11.6 yesterday -Elevated ALP, otherwise normal LFTs: T. Bili 0.1/ AST 14/ ALT 10/ ALP 137  Esophageal plaques concerning for candida seen on endoscopy yesterday, path pending  Plan: Continue Zosyn.  Clear liquid diet today.  Continue supportive care.  If patient is feeling better tomorrow, we will advance to full liquid diet.  If pain persists or worsen, we will consider repeat CT.  [facility] will follow.   Edrick Kins PA-C 07/24/2020, 10:00 AM  Contact #  [phone]

## 2020-07-24 NOTE — Progress Notes (Signed)
PROGRESS NOTE    Rayelle Armor  JJO:[id number] DOB: [birth year] DOA: 07/10/2020 PCP: Lorelee Market, MD   Brief Narrative:  44 y.o.femalewith medical history significant ofendometriosis status post diagnostic laparoscopy and hysterectomy, IBS-D, GERD, status post cholecystectomy presents with abdominal pain x4 days, epigastric and left upper quadrant with associated nausea, vomiting and diarrhea, pain rated 4 out of 10 and when flares of up to 8 out of 10 with certain movement and following meals, not improved by Tums and daily PPI at home.  Drinks alcohol about sixpack on occasion on weekends. In the ED vitals stable labs with hypokalemia 3.0 alk phos 230, leukocytosis 14 K lipase 151 troponin x2 - UA normal CT abdomen showed pancreatitis with fluid collection consistent with pseudocyst reported as "stranding noted around the pancreatic body and tail compatible with acute pancreatitis. Adjacent fluid collections, the largest 3.8 cm most compatible with acute fluid collections and developing pseudocysts. Secondary reactive inflammation of the adjacent gastric wall. Small to moderate free fluid in the pelvis" Patient was given IV fluids pain medication Zofran and admission was requested. GI was consulted.  Had MRCP no evidence of choledocholithiasis. Patient is  being treated for acute pancreatitis, possibly passed CBD stone. underwent repeat CTA 10/25-that showed multiple pseudocyst pressing on the stomach-largest being 8.7 cm with significant mass-effect on the gastric body  Assessment & Plan:  Acute pancreatitis pseudocyst formation: -Status post cyst gastrostomy on 07/22/2020-plastic double-pigtail stents placed. -Continue aggressive IV hydration and pain management w/ IV Dilaudid oxy, bentyl -Tolerating clear liquid diet however continues to have epigastric pain. -Hold Protonix -Pancreatic cyst fluid growing gram-negative rods, gram-positive cocci and rods.  Final culture result is  pending.   -Continue Zosyn. Remained afebrile, leukocytosis: Slightly trended up from 11.6-12.1 today.. Liver enzymes: AST, ALT: WNL, alkaline phosphatase: 137. -Started on Creon -Appreciate GI help  Esophageal plaques: Suspicious for candidiasis. - Pathology is pending. Hold PPI. -Continue nystatin swish and swallow while awaiting results for pathology.  Fluid overload with leg edema and shortness of breath: Resolved after IV Lasix -Continue IV hydration and monitor signs of fluid overload  Loose stool intermittently: Continue IV fluids.   -Started on Creon  Hypokalemia: Resolved  Hypoalbuminemia in the setting of acute pancreatitis.  Albumin: 2.4.  Anemia of chronic disease hemoglobin ranging 9 to 11 g.   -No signs of active bleeding.  Monitor H&H closely.  Thrombocytosis: Improving. -Likely reactive.  Continue to monitor  FEN: -Monitor calorie counts. Tolerating clear liquid diet.  Mild metabolic acidosis/dehydration due to #1.  Resolved. Chronic back pain, controlled, continue pain management Depression: Her mood is stable and cheerful. Leukocytosis : Resolved  DVT prophylaxis: SCD, no chemical anticoagulation as per GI recommendations.  Code Status: Full code Family Communication:  None present at bedside.  Plan of care discussed with patient in length and he verbalized understanding and agreed with it. Disposition Plan: Home after the procedure  Consultants:   GI  Procedures:   As above  Antimicrobials:   Zosyn  Status is: Inpatient   Dispo: The patient is from: Home              Anticipated d/c is to: Home              Anticipated d/c date is: 07/24/2020              Patient currently not medically stable for the discharge    Subjective: Patient seen and examined.  Very pleasant.  Tells me that she has  worsening epigastric pain which goes to her chest.  She denies any other symptoms such as nausea, vomiting, chest pain or shortness of breath.  She  continues to have loose stool.  objective: Vitals:   07/23/20 1208 07/23/20 2120 07/24/20 0610 07/24/20 0846  BP: 114/86 119/66 110/69   Pulse: 96 82 72   Resp: $Remo'19 17 18   'bqMWC$ Temp: 98.2 F (36.8 C) 98.3 F (36.8 C) 98.2 F (36.8 C)   TempSrc: Oral Oral Oral   SpO2: 100% 100% 100% 97%  Weight:      Height:        Intake/Output Summary (Last 24 hours) at 07/24/2020 1119 Last data filed at 07/24/2020 0517 Gross per 24 hour  Intake 3995.76 ml  Output --  Net 3995.76 ml   Filed Weights   07/17/20 1500 07/22/20 1506  Weight: 63.5 kg 63.5 kg   General exam: Appears calm and comfortable, on room air, communicating well Respiratory system: Clear to auscultation. Respiratory effort normal. Cardiovascular system: S1 & S2 heard, RRR. No JVD, murmurs, rubs, gallops or clicks. No pedal edema. Gastrointestinal system: Epigastric tenderness positive, no guarding, no rigidity, bowel sounds positive. Central nervous system: Alert and oriented. No focal neurological deficits. Extremities: Symmetric 5 x 5 power. Skin: No rashes, lesions or ulcers. Psychiatry: Judgement and insight appear normal. Mood & affect appropriate.     Data Reviewed: I have personally reviewed following labs and imaging studies  CBC: Recent Labs  Lab 07/21/20 0411 07/22/20 0119 07/22/20 2020 07/23/20 0031 07/24/20 0059  WBC 10.8* 12.5* 14.4* 11.6* 12.1*  NEUTROABS  --   --   --  10.8* 8.1*  HGB 9.9* 10.1* 11.0* 10.3* 9.3*  HCT 32.0* 32.3* 34.9* 32.8* 29.2*  MCV 100.0 98.2 98.9 96.8 98.0  PLT 604* 672* 748* 759* 540*   Basic Metabolic Panel: Recent Labs  Lab 07/20/20 0207 07/21/20 0411 07/22/20 0119 07/23/20 0031 07/24/20 0059  NA 141 139 135 133* 138  K 4.0 4.4 4.7 4.6 4.1  CL 101 100 97* 96* 103  CO2 $Re'30 30 28 26 26  'joR$ GLUCOSE 97 102* 82 182* 109*  BUN <5* <5* $Remov'8 11 8  'bYDHbi$ CREATININE 0.49 0.73 0.74 0.88 0.90  CALCIUM 9.0 9.0 8.9 9.3 8.7*   GFR: Estimated Creatinine Clearance: 77.6 mL/min (by C-G  formula based on SCr of 0.9 mg/dL). Liver Function Tests: Recent Labs  Lab 07/18/20 0144 07/22/20 0119 07/23/20 0031 07/24/20 0059  AST 12* 11* 15 14*  ALT $Re'8 8 9 10  'OpR$ ALKPHOS 150* 140* 175* 137*  BILITOT 0.5 0.6 0.5 0.1*  PROT 5.6* 6.4* 6.9 6.1*  ALBUMIN 2.3* 2.3* 2.4* 2.4*   Recent Labs  Lab 07/22/20 2020  LIPASE 92*  AMYLASE 154*   No results for input(s): AMMONIA in the last 168 hours. Coagulation Profile: Recent Labs  Lab 07/22/20 0119  INR 1.1   Cardiac Enzymes: No results for input(s): CKTOTAL, CKMB, CKMBINDEX, TROPONINI in the last 168 hours. BNP (last 3 results) No results for input(s): PROBNP in the last 8760 hours. HbA1C: No results for input(s): HGBA1C in the last 72 hours. CBG: Recent Labs  Lab 07/22/20 2020 07/23/20 0011 07/23/20 0428 07/23/20 0803 07/23/20 1129  GLUCAP 98 163* 135* 172* 97   Lipid Profile: No results for input(s): CHOL, HDL, LDLCALC, TRIG, CHOLHDL, LDLDIRECT in the last 72 hours. Thyroid Function Tests: No results for input(s): TSH, T4TOTAL, FREET4, T3FREE, THYROIDAB in the last 72 hours. Anemia Panel: No results for input(s): VITAMINB12, FOLATE,  FERRITIN, TIBC, IRON, RETICCTPCT in the last 72 hours. Sepsis Labs: No results for input(s): PROCALCITON, LATICACIDVEN in the last 168 hours.  Recent Results (from the past 240 hour(s))  Anaerobic culture     Status: None (Preliminary result)   Collection Time: 07/22/20  5:03 PM   Specimen: PATH GI biopsy; Body Fluid  Result Value Ref Range Status   Specimen Description FLUID  Final   Special Requests PANCREACTIC CYST FLUID SPEC A  Final   Gram Stain   Final    FEW WBC PRESENT, PREDOMINANTLY MONONUCLEAR FEW GRAM NEGATIVE RODS RARE GRAM POSITIVE COCCI IN CLUSTERS RARE GRAM POSITIVE RODS Performed at [facility], [place] [address], [place], [place] [address]    Culture PENDING  Incomplete   Report Status PENDING  Incomplete      Radiology Studies: DG Chest Port 1  View  Result Date: 07/22/2020 CLINICAL DATA:  Pancreatitis EXAM: PORTABLE CHEST 1 VIEW COMPARISON:  07/21/2020 FINDINGS: Single frontal view of the chest demonstrates an unremarkable cardiac silhouette. No airspace disease, effusion, or pneumothorax. No acute bony abnormalities. IMPRESSION: 1. No acute intrathoracic process. Electronically Signed   By: Randa Ngo M.D.   On: 07/22/2020 19:18   DG Abd Portable 2V  Result Date: 07/22/2020 CLINICAL DATA:  Pancreatitis, cystogastrostomy, pain EXAM: PORTABLE ABDOMEN - 2 VIEW COMPARISON:  None. FINDINGS: Supine and left lateral decubitus views of the abdomen and pelvis are obtained. Postsurgical changes are seen within the left mid abdomen compatible with pseudocyst drainage. No masses or abnormal calcifications. No bowel obstruction or ileus. No free gas. No acute bony abnormalities. IMPRESSION: 1. Postsurgical changes from internal decompression of pancreatic pseudocyst. 2. No evidence of bowel obstruction or perforation. Electronically Signed   By: Randa Ngo M.D.   On: 07/22/2020 19:20    Scheduled Meds: . estradiol  1 mg Oral Daily  . feeding supplement (PROSource TF)  45 mL Per Tube Daily  . lipase/protease/amylase  36,000 Units Oral TID WC  . mometasone-formoterol  2 puff Inhalation BID  . multivitamin with minerals  1 tablet Oral Daily  . nystatin  5 mL Oral QID  . potassium chloride  20 mEq Oral Daily  . sodium chloride flush  3 mL Intravenous Q12H   Continuous Infusions: . feeding supplement (VITAL 1.5 CAL) 1,000 mL (07/21/20 0035)  . lactated ringers 50 mL/hr at 07/22/20 1613  . piperacillin-tazobactam (ZOSYN)  IV 3.375 g (07/24/20 0611)     LOS: 14 days   Time spent: 35 minutes   Demarious Kapur Loann Quill, MD [facility]  If 7PM-7AM, please contact night-coverage [email or web address] 07/24/2020, 11:19 AM

## 2020-07-25 ENCOUNTER — Encounter (HOSPITAL_COMMUNITY): Payer: Self-pay | Admitting: Gastroenterology

## 2020-07-25 ENCOUNTER — Encounter: Payer: Self-pay | Admitting: Gastroenterology

## 2020-07-25 DIAGNOSIS — K859 Acute pancreatitis without necrosis or infection, unspecified: Secondary | ICD-10-CM | POA: Diagnosis not present

## 2020-07-25 LAB — CBC WITH DIFFERENTIAL/PLATELET
Abs Immature Granulocytes: 0.04 10*3/uL (ref 0.00–0.07)
Basophils Absolute: 0.2 10*3/uL — ABNORMAL HIGH (ref 0.0–0.1)
Basophils Relative: 2 %
Eosinophils Absolute: 0.5 10*3/uL (ref 0.0–0.5)
Eosinophils Relative: 5 %
HCT: 29.6 % — ABNORMAL LOW (ref 36.0–46.0)
Hemoglobin: 9.1 g/dL — ABNORMAL LOW (ref 12.0–15.0)
Immature Granulocytes: 0 %
Lymphocytes Relative: 25 %
Lymphs Abs: 2.3 10*3/uL (ref 0.7–4.0)
MCH: 29.9 pg (ref 26.0–34.0)
MCHC: 30.7 g/dL (ref 30.0–36.0)
MCV: 97.4 fL (ref 80.0–100.0)
Monocytes Absolute: 0.9 10*3/uL (ref 0.1–1.0)
Monocytes Relative: 10 %
Neutro Abs: 5.5 10*3/uL (ref 1.7–7.7)
Neutrophils Relative %: 58 %
Platelets: 645 10*3/uL — ABNORMAL HIGH (ref 150–400)
RBC: 3.04 MIL/uL — ABNORMAL LOW (ref 3.87–5.11)
RDW: 12.7 % (ref 11.5–15.5)
WBC: 9.4 10*3/uL (ref 4.0–10.5)
nRBC: 0 % (ref 0.0–0.2)

## 2020-07-25 LAB — COMPREHENSIVE METABOLIC PANEL
ALT: 16 U/L (ref 0–44)
AST: 21 U/L (ref 15–41)
Albumin: 2.4 g/dL — ABNORMAL LOW (ref 3.5–5.0)
Alkaline Phosphatase: 154 U/L — ABNORMAL HIGH (ref 38–126)
Anion gap: 8 (ref 5–15)
BUN: <5 mg/dL — ABNORMAL LOW (ref 6–20)
CO2: 26 mmol/L (ref 22–32)
Calcium: 8.9 mg/dL (ref 8.9–10.3)
Chloride: 104 mmol/L (ref 98–111)
Creatinine, Ser: 0.85 mg/dL (ref 0.44–1.00)
GFR, Estimated: >60 mL/min (ref >60)
Glucose, Bld: 108 mg/dL — ABNORMAL HIGH (ref 70–99)
Potassium: 4.1 mmol/L (ref 3.5–5.1)
Sodium: 138 mmol/L (ref 135–145)
Total Bilirubin: <0.1 mg/dL — ABNORMAL LOW (ref 0.3–1.2)
Total Protein: 6 g/dL — ABNORMAL LOW (ref 6.5–8.1)

## 2020-07-25 LAB — C DIFFICILE (CDIFF) QUICK SCRN (NO PCR REFLEX)
C Diff antigen: POSITIVE — AB
C Diff toxin: NEGATIVE

## 2020-07-25 MED ORDER — FLUCONAZOLE 100 MG PO TABS
200.0000 mg | ORAL_TABLET | Freq: Every day | ORAL | Status: AC
  Administered 2020-07-25 – 2020-08-06 (×13): 200 mg via ORAL
  Filled 2020-07-25 (×13): qty 2

## 2020-07-25 MED ORDER — ENSURE ENLIVE PO LIQD
237.0000 mL | Freq: Two times a day (BID) | ORAL | Status: DC
  Administered 2020-07-25 – 2020-08-07 (×9): 237 mL via ORAL

## 2020-07-25 NOTE — Progress Notes (Signed)
Pharmacy Antibiotic Note  Tricia Brown is a 44 y.o. female admitted on 07/10/2020 with pancreatitis complicated by pseudocyst formation.  Pharmacy has been consulted for Zosyn dosing for intra-abdominal coverage.  Renal function stable - no culture results at this time  Plan:  Continue Zosyn 3.375 gm IV q8hrs (each over 4 hours).  Follow renal function, culture data, and clinical progress.  Noted plan to continue antibiotics until stent removed, and to transition to oral antibiotics when able.  Height: 5\' 7"  (170.2 cm) Weight: 63.5 kg (140 lb) IBW/kg (Calculated) : 61.6  Temp (24hrs), Avg:97.8 F (36.6 C), Min:97.7 F (36.5 C), Max:97.9 F (36.6 C)  Recent Labs  Lab 07/21/20 0411 07/21/20 0411 07/22/20 0119 07/22/20 2020 07/23/20 0031 07/24/20 0059 07/25/20 0037  WBC 10.8*   < > 12.5* 14.4* 11.6* 12.1* 9.4  CREATININE 0.73  --  0.74  --  0.88 0.90 0.85   < > = values in this interval not displayed.    Estimated Creatinine Clearance: 82.1 mL/min (by C-G formula based on SCr of 0.85 mg/dL).    No Known Allergies  Antimicrobials this admission:   Zosyn 11/1>>  Dose adjustments this admission:  n/a  Microbiology results:  10/21 COVID and flu: negative  11/1 fluid: GNR,GPC,GPR on gram stain  Thank you for allowing pharmacy to be a part of this patient's care.  13/1, RPh Phone: [phone] 07/25/2020 10:29 AM

## 2020-07-25 NOTE — Plan of Care (Signed)
°  Problem: Education: Goal: Knowledge of General Education information will improve Description: Including pain rating scale, medication(s)/side effects and non-pharmacologic comfort measures Outcome: Progressing   Problem: Clinical Measurements: Goal: Ability to maintain clinical measurements within normal limits will improve Outcome: Progressing Goal: Will remain free from infection Outcome: Progressing Goal: Diagnostic test results will improve Outcome: Progressing Goal: Respiratory complications will improve Outcome: Progressing Goal: Cardiovascular complication will be avoided Outcome: Progressing   Problem: Activity: Goal: Risk for activity intolerance will decrease Outcome: Progressing   Problem: Nutrition: Goal: Adequate nutrition will be maintained Outcome: Progressing   Problem: Coping: Goal: Level of anxiety will decrease Outcome: Not Progressing   Problem: Pain Managment: Goal: General experience of comfort will improve Outcome: Not Progressing

## 2020-07-25 NOTE — Progress Notes (Signed)
PROGRESS NOTE    Tricia Brown  WNI:[id number] DOB: 1976-04-25 DOA: 07/10/2020 PCP: Lorelee Market, MD   Brief Narrative:  44 y.o.femalewith medical history significant ofendometriosis status post diagnostic laparoscopy and hysterectomy, IBS-D, GERD, status post cholecystectomy presents with abdominal pain x4 days, epigastric and left upper quadrant with associated nausea, vomiting and diarrhea, pain rated 4 out of 10 and when flares of up to 8 out of 10 with certain movement and following meals, not improved by Tums and daily PPI at home.  Drinks alcohol about sixpack on occasion on weekends. In the ED vitals stable labs with hypokalemia 3.0 alk phos 230, leukocytosis 14 K lipase 151 troponin x2 - UA normal CT abdomen showed pancreatitis with fluid collection consistent with pseudocyst reported as "stranding noted around the pancreatic body and tail compatible with acute pancreatitis. Adjacent fluid collections, the largest 3.8 cm most compatible with acute fluid collections and developing pseudocysts. Secondary reactive inflammation of the adjacent gastric wall. Small to moderate free fluid in the pelvis" Patient was given IV fluids pain medication Zofran and admission was requested. GI was consulted.  Had MRCP no evidence of choledocholithiasis. Patient is  being treated for acute pancreatitis, possibly passed CBD stone. underwent repeat CTA 10/25-that showed multiple pseudocyst pressing on the stomach-largest being 8.7 cm with significant mass-effect on the gastric body  Assessment & Plan:  Acute pancreatitis pseudocyst formation: -Status post cyst gastrostomy on 07/22/2020-plastic double-pigtail stents placed. -Continue aggressive IV hydration and pain management w/ IV Dilaudid oxy, bentyl -Advancing diet per GI recommendations. -Hold Protonix -Pancreatic cyst fluid growing gram-negative rods, gram-positive cocci and rods.  Final culture result is pending.   -Continue Zosyn.  Remained afebrile, without leukocytosis -Started on Creon  Esophageal candidiasis - Pathology remarkable for Candida, advance to Diflucan per GI, discontinue nystatin  Fluid overload with leg edema and shortness of breath:  - Resolved after IV Lasix - Continue IV hydration and monitor signs of fluid overload  Loose stool intermittently:  -Likely multifactorial given above, improving with Creon and Imodium  -Continue IV fluids if unable to keep up with p.o. intake needs.   -Started on Creon  Hypokalemia: Resolved  Hypoalbuminemia in the setting of acute pancreatitis.  Albumin: 2.4.  Anemia of chronic disease hemoglobin ranging 9 to 11 g.   -No signs of active bleeding.  Monitor H&H closely.  Thrombocytosis: Improving. -Likely reactive.  Continue to monitor  Mild metabolic acidosis/dehydration due to #1.  Resolved. Chronic back pain, controlled, continue pain management Depression:  Stable  DVT prophylaxis: SCD, no chemical anticoagulation as per GI recommendations.  Code Status: Full code Family Communication:  None present at bedside.  Plan of care discussed with patient in length and he verbalized understanding and agreed with it. Disposition Plan: Home after the procedure  Consultants:   GI  Procedures:   As above  Antimicrobials:   Zosyn  Status is: Inpatient   Dispo: The patient is from: Home              Anticipated d/c is to: Home              Anticipated d/c date is: 07/24/2020              Patient currently not medically stable for the discharge    Subjective: No acute issues or events overnight, patient feels markedly improved from yesterday.  He still is having moderate loose and soft stools but again improving from previous.  Denies overt chest pain, shortness of  breath, nausea, vomiting, diarrhea, constipation, headache, fevers, chills.  Objective: Vitals:   07/24/20 1139 07/24/20 1907 07/24/20 2045 07/25/20 0439  BP: 126/74  117/70 120/70    Pulse: 83  85 80  Resp:   17 17  Temp: 97.7 F (36.5 C)  97.9 F (36.6 C)   TempSrc: Oral  Oral   SpO2: 100% 100% 100% 100%  Weight:      Height:        Intake/Output Summary (Last 24 hours) at 07/25/2020 0756 Last data filed at 07/24/2020 1412 Gross per 24 hour  Intake 358 ml  Output --  Net 358 ml   Filed Weights   07/17/20 1500 07/22/20 1506  Weight: 63.5 kg 63.5 kg   General exam: Appears calm and comfortable, on room air, communicating well Respiratory system: Clear to auscultation. Respiratory effort normal. Cardiovascular system: S1 & S2 heard, RRR. No JVD, murmurs, rubs, gallops or clicks. No pedal edema. Gastrointestinal system: Epigastric tenderness positive, no guarding, no rigidity, bowel sounds positive. Central nervous system: Alert and oriented. No focal neurological deficits. Extremities: Symmetric 5 x 5 power. Skin: No rashes, lesions or ulcers. Psychiatry: Judgement and insight appear normal. Mood & affect appropriate.   Data Reviewed: I have personally reviewed following labs and imaging studies  CBC: Recent Labs  Lab 07/22/20 0119 07/22/20 2020 07/23/20 0031 07/24/20 0059 07/25/20 0037  WBC 12.5* 14.4* 11.6* 12.1* 9.4  NEUTROABS  --   --  10.8* 8.1* 5.5  HGB 10.1* 11.0* 10.3* 9.3* 9.1*  HCT 32.3* 34.9* 32.8* 29.2* 29.6*  MCV 98.2 98.9 96.8 98.0 97.4  PLT 672* 748* 759* 710* 938*   Basic Metabolic Panel: Recent Labs  Lab 07/21/20 0411 07/22/20 0119 07/23/20 0031 07/24/20 0059 07/25/20 0037  NA 139 135 133* 138 138  K 4.4 4.7 4.6 4.1 4.1  CL 100 97* 96* 103 104  CO2 _0 GLUCOSE 102* 82 182* 109* 108*  BUN <5* _1 <5*  CREATININE 0.73 0.74 0.88 0.90 0.85  CALCIUM 9.0 8.9 9.3 8.7* 8.9   GFR: Estimated Creatinine Clearance: 82.1 mL/min (by C-G formula based on SCr of 0.85 mg/dL). Liver Function Tests: Recent Labs  Lab 07/22/20 0119 07/23/20 0031 07/24/20 0059 07/25/20 0037  AST 11* 15 14* 21  ALT _2 ALKPHOS 140* 175* 137* 154*  BILITOT 0.6 0.5 0.1* <0.1*  PROT 6.4* 6.9 6.1* 6.0*  ALBUMIN 2.3* 2.4* 2.4* 2.4*   Recent Labs  Lab 07/22/20 2020  LIPASE 92*  AMYLASE 154*   No results for input(s): AMMONIA in the last 168 hours. Coagulation Profile: Recent Labs  Lab 07/22/20 0119  INR 1.1   Cardiac Enzymes: No results for input(s): CKTOTAL, CKMB, CKMBINDEX, TROPONINI in the last 168 hours. BNP (last 3 results) No results for input(s): PROBNP in the last 8760 hours. HbA1C: No results for input(s): HGBA1C in the last 72 hours. CBG: Recent Labs  Lab 07/22/20 2020 07/23/20 0011 07/23/20 0428 07/23/20 0803 07/23/20 1129  GLUCAP 98 163* 135* 172* 97   Lipid Profile: No results for input(s): CHOL, HDL, LDLCALC, TRIG, CHOLHDL, LDLDIRECT in the last 72 hours. Thyroid Function Tests: No results for input(s): TSH, T4TOTAL, FREET4, T3FREE, THYROIDAB in the last 72 hours. Anemia Panel: No results for input(s): VITAMINB12, FOLATE, FERRITIN, TIBC, IRON, RETICCTPCT in the last 72 hours. Sepsis Labs: No results for input(s): PROCALCITON, LATICACIDVEN in the last 168 hours.  Recent Results (from the past 240  hour(s))  Anaerobic culture     Status: None (Preliminary result)   Collection Time: 07/22/20  5:03 PM   Specimen: PATH GI biopsy; Body Fluid  Result Value Ref Range Status   Specimen Description FLUID  Final   Special Requests PANCREACTIC CYST FLUID SPEC A  Final   Gram Stain   Final    FEW WBC PRESENT, PREDOMINANTLY MONONUCLEAR FEW GRAM NEGATIVE RODS RARE GRAM POSITIVE COCCI IN CLUSTERS RARE GRAM POSITIVE RODS Performed at [facility], [place] [address] [place], [place] [address]    Culture PENDING  Incomplete   Report Status PENDING  Incomplete      Radiology Studies: No results found.  Scheduled Meds: . estradiol  1 mg Oral Daily  . feeding supplement (PROSource TF)  45 mL Per Tube Daily  . lipase/protease/amylase  72,000 Units Oral TID WC  .  mometasone-formoterol  2 puff Inhalation BID  . multivitamin with minerals  1 tablet Oral Daily  . nystatin  5 mL Oral QID  . potassium chloride  20 mEq Oral Daily  . sodium chloride flush  3 mL Intravenous Q12H   Continuous Infusions: . feeding supplement (VITAL 1.5 CAL) 1,000 mL (07/21/20 0035)  . lactated ringers 50 mL/hr at 07/22/20 1613  . piperacillin-tazobactam (ZOSYN)  IV 3.375 g (07/24/20 2140)     LOS: 15 days   Time spent: 43 minutes   [facility]  If 7PM-7AM, please contact night-coverage [email or web address] 07/25/2020, 7:56 AM

## 2020-07-25 NOTE — Progress Notes (Signed)
[facility] Progress Note  Tricia Brown 44 y.o. October 21, 1975  CC:  Pancreatitis with pseudocysts s/p cyst gastrostomy 11/1  Subjective: Patient reports continued epigastric pain with radiation into her chest and left arm, but states it is improving.  She continues to have diarrhea approximately 5 times per day. She is interested in trying full liquids.  ROS : Review of Systems  Cardiovascular: Negative for chest pain and palpitations.  Gastrointestinal: Positive for abdominal pain, diarrhea and nausea. Negative for blood in stool, constipation, heartburn, melena and vomiting.   Objective: Vital signs in last 24 hours: Vitals:   07/24/20 2045 07/25/20 0439  BP: 117/70 120/70  Pulse: 85 80  Resp: 17 17  Temp: 97.9 F (36.6 C)   SpO2: 100% 100%    Physical Exam:  General:  Alert, oriented, cooperative, no acute distress  Head:  Normocephalic, without obvious abnormality, atraumatic  Eyes:  Anicteric sclera, EOM's intact,   Lungs:   Clear to auscultation bilaterally, respirations unlabored  Heart:  Regular rate and rhythm, S1, S2 normal  Abdomen:   Mildly firm, mildly distended, mild epigastric tenderness without guarding, bowel sounds active all four quadrants, no peritoneal signs   Extremities: Extremities normal, atraumatic, no  edema  Pulses: 2+ and symmetric    Lab Results: Recent Labs    07/24/20 0059 07/25/20 0037  NA 138 138  K 4.1 4.1  CL 103 104  CO2 26 26  GLUCOSE 109* 108*  BUN 8 <5*  CREATININE 0.90 0.85  CALCIUM 8.7* 8.9   Recent Labs    07/24/20 0059 07/25/20 0037  AST 14* 21  ALT 10 16  ALKPHOS 137* 154*  BILITOT 0.1* <0.1*  PROT 6.1* 6.0*  ALBUMIN 2.4* 2.4*   Recent Labs    07/24/20 0059 07/25/20 0037  WBC 12.1* 9.4  NEUTROABS 8.1* 5.5  HGB 9.3* 9.1*  HCT 29.2* 29.6*  MCV 98.0 97.4  PLT 710* 645*   No results for input(s): LABPROT, INR in the last 72 hours. Assessment: Pancreatitis with pseudocyst formation s/p cyst  gastrostomy 11/1 -Normal renal function: BUN <5/ Cr 0.85 -WBCs now within normal limits: 9.4 today as compared to 12.1 yesterday -Elevated ALP, otherwise normal LFTs -PPI discontinued per Dr. Meridee Score, as decreased gastric acid could delay resolution of pseudocysts after cyst gastrostomy  Candida esophagitis, which could be contributing to chest pain  Plan: Continue Zosyn.  Continue Creon.  Continue Imodium PRN, consider scheduling doses initially.  Discontinue Nystatin and initiate Diflucan.  Advance to full liquid diet today.  [facility] will follow.   Edrick Kins PA-C 07/25/2020, 11:38 AM  Contact #  [phone]

## 2020-07-25 NOTE — Progress Notes (Signed)
Nutrition Follow-up  DOCUMENTATION CODES:   Not applicable  INTERVENTION:   -Ensure Enlive po BID, each supplement provides 350 kcal and 20 grams of protein -MVI with minerals daily -D/c Vital 1.5 due to no feeding access  NUTRITION DIAGNOSIS:   Inadequate oral intake related to altered GI function as evidenced by NPO status.  Progressing; advanced to full liquid diet on 07/25/20  GOAL:   Patient will meet greater than or equal to 90% of their needs  Progressing   MONITOR:   PO intake, Supplement acceptance, Diet advancement, Labs, Weight trends, Skin, I & O's  REASON FOR ASSESSMENT:   Malnutrition Screening Tool    ASSESSMENT:   Tricia Brown is a 44 y.o. female with medical history significant of endometriosis status post diagnostic laparoscopy and hysterectomy, IBS-D, GERD, status post cholecystectomy who presents with abdominal pain.  10/29- post-pyloric feeding tube placed, bridled 10/31- s/p cortrak removal, TF d/c 11/1- s/p EGD- esophageal plaques found (suspicious for candidiasis biopsies), erythematous mucosa in stomach, extrinsic compression in the posterior wall of the stomach, stomach biopsied,   Reviewed I/O's: +358 ml x 24 hours and +23.8 L since 07/11/20  Spoke with pt, who was sitting in recliner chair at time of visit. She was pleasant and in good spirits; very appreciative of the care she has received and plan going forward. She reports she still is having some nausea and mild abdominal pain, but this has improved. She denies any vomiting. She reports she consumed a half bowl of grits today and an [place] ice and tolerated well.   Discussed importance of good meal and supplement intake to promote healing. Pt amenable to try Ensure Enlive supplements due to increased nutrient density.   Labs reviewed.   Diet Order:   Diet Order            Diet full liquid Room service appropriate? Yes; Fluid consistency: Thin  Diet effective now                  EDUCATION NEEDS:   Education needs have been addressed  Skin:  Skin Assessment: Reviewed RN Assessment  Last BM:  07/25/20  Height:   Ht Readings from Last 1 Encounters:  07/17/20 5\' 7"  (1.702 m)    Weight:   Wt Readings from Last 1 Encounters:  07/22/20 63.5 kg    Ideal Body Weight:  61.4 kg  BMI:  Body mass index is 21.93 kg/m.  Estimated Nutritional Needs:   Kcal:  1900-2100  Protein:  95-110 grams  Fluid:  > 1.9 L    13/01/21, RD, LDN, CDCES Registered Dietitian II Certified Diabetes Care and Education Specialist Please refer to [facility] for RD and/or RD on-call/weekend/after hours pager

## 2020-07-26 ENCOUNTER — Inpatient Hospital Stay (HOSPITAL_COMMUNITY): Payer: Medicaid Other

## 2020-07-26 DIAGNOSIS — K859 Acute pancreatitis without necrosis or infection, unspecified: Secondary | ICD-10-CM | POA: Diagnosis not present

## 2020-07-26 LAB — FECAL FAT, QUALITATIVE
Fat Qual Neutral, Stl: NORMAL
Fat Qual Total, Stl: NORMAL

## 2020-07-26 LAB — LACTOFERRIN, FECAL, QUALITATIVE: Lactoferrin, Fecal, Qual: NEGATIVE

## 2020-07-26 MED ORDER — IOHEXOL 350 MG/ML SOLN
100.0000 mL | Freq: Once | INTRAVENOUS | Status: AC | PRN
  Administered 2020-07-26: 100 mL via INTRAVENOUS

## 2020-07-26 MED ORDER — HYDROCORTISONE (PERIANAL) 2.5 % EX CREA
TOPICAL_CREAM | Freq: Three times a day (TID) | CUTANEOUS | Status: DC
  Administered 2020-08-01 – 2020-08-06 (×2): 1 via RECTAL
  Filled 2020-07-26 (×4): qty 28.35

## 2020-07-26 MED ORDER — VANCOMYCIN 50 MG/ML ORAL SOLUTION
125.0000 mg | Freq: Four times a day (QID) | ORAL | Status: DC
  Administered 2020-07-26 – 2020-07-29 (×14): 125 mg via ORAL
  Filled 2020-07-26 (×20): qty 2.5

## 2020-07-26 MED ORDER — IOHEXOL 9 MG/ML PO SOLN
500.0000 mL | ORAL | Status: AC
  Administered 2020-07-26 (×2): 500 mL via ORAL

## 2020-07-26 NOTE — Progress Notes (Signed)
PROGRESS NOTE    Tricia Brown  RUE:[id number] DOB: [birth year] DOA: 07/10/2020 PCP: Lorelee Market, MD   Brief Narrative:  44 y.o.femalewith medical history significant ofendometriosis status post diagnostic laparoscopy and hysterectomy, IBS-D, GERD, status post cholecystectomy presents with abdominal pain x4 days, epigastric and left upper quadrant with associated nausea, vomiting and diarrhea, pain rated 4 out of 10 and when flares of up to 8 out of 10 with certain movement and following meals, not improved by Tums and daily PPI at home.  Drinks alcohol about sixpack on occasion on weekends. In the ED vitals stable labs with hypokalemia 3.0 alk phos 230, leukocytosis 14 K lipase 151 troponin x2 - UA normal CT abdomen showed pancreatitis with fluid collection consistent with pseudocyst reported as "stranding noted around the pancreatic body and tail compatible with acute pancreatitis. Adjacent fluid collections, the largest 3.8 cm most compatible with acute fluid collections and developing pseudocysts. Secondary reactive inflammation of the adjacent gastric wall. Small to moderate free fluid in the pelvis" Patient was given IV fluids pain medication Zofran and admission was requested. GI was consulted.  Had MRCP no evidence of choledocholithiasis. Patient is  being treated for acute pancreatitis, possibly passed CBD stone; she underwent repeat CTA 10/25-that showed multiple pseudocyst pressing on the stomach-largest being 8.7 cm with significant mass-effect on the gastric body.  Assessment & Plan:  Acute pancreatitis pseudocyst formation: -Status post cyst gastrostomy on 07/22/2020-plastic double-pigtail stents placed. -Continue aggressive IV hydration and pain management w/ IV Dilaudid oxy, bentyl -Advancing diet per GI recommendations. -Hold Protonix -Pancreatic cyst fluid growing gram-negative rods, gram-positive cocci and rods.  Final culture result is pending.   -Continue Zosyn.  Remains afebrile, without leukocytosis -Continue on Creon -CT pending - may require necrosectomy pending imaging  Esophageal candidiasis - Pathology remarkable for Candida, continue Diflucan per GI, discontinue nystatin  Fluid overload with leg edema and shortness of breath:  - Resolved after IV Lasix administration - Continue IV hydration and monitor signs of fluid overload  Loose stool intermittently:  -Likely multifactorial given above, improving with Creon and Imodium  -Continue IV fluids if unable to keep up with p.o. intake needs.   -Started on Creon  Hypokalemia: Resolved  Hypoalbuminemia in the setting of acute pancreatitis.  Albumin: 2.4.  Anemia of chronic disease hemoglobin ranging 9 to 11 g.   -No signs of active bleeding.  Monitor H&H closely.  Thrombocytosis: Improving. -Likely reactive.  Continue to monitor  Mild metabolic acidosis/dehydration due to #1.  Resolved. Chronic back pain, controlled, continue pain management Depression:  Stable  DVT prophylaxis: SCD, no chemical anticoagulation as per GI recommendations.  Code Status: Full code Family Communication:  None present at bedside.  Plan of care discussed with patient in length and he verbalized understanding and agreed with it. Disposition Plan: Home after the procedure  Consultants:   GI  Procedures:   As above  Antimicrobials:   Zosyn  Status is: Inpatient  Dispo: The patient is from: Home              Anticipated d/c is to: Home              Anticipated d/c date is: 07/24/2020              Patient currently not medically stable for the discharge  Subjective: No acute issues or events overnight, patient feels somewhat worse compared to yesterday - otherwise she denies overt chest pain, shortness of breath, nausea, vomiting, constipation, headache, fevers,  chills.  Objective: Vitals:   07/24/20 2045 07/25/20 0439 07/25/20 1425 07/25/20 2058  BP: 117/70 120/70 121/79 123/84  Pulse:  85 80 88 88  Resp: $Remo'17 17 17   'cRcRx$ Temp: 97.9 F (36.6 C)  98 F (36.7 C) 97.6 F (36.4 C)  TempSrc: Oral  Oral Oral  SpO2: 100% 100% 100% 100%  Weight:      Height:        Intake/Output Summary (Last 24 hours) at 07/26/2020 0800 Last data filed at 07/26/2020 0357 Gross per 24 hour  Intake 2397.41 ml  Output --  Net 2397.41 ml   Filed Weights   07/17/20 1500 07/22/20 1506  Weight: 63.5 kg 63.5 kg   General exam: Appears calm and comfortable, on room air, communicating well Respiratory system: Clear to auscultation. Respiratory effort normal. Cardiovascular system: S1 & S2 heard, RRR. No JVD, murmurs, rubs, gallops or clicks. No pedal edema. Gastrointestinal system: Epigastric tenderness ongoing, no guarding, no rigidity, bowel sounds positive. Central nervous system: Alert and oriented. No focal neurological deficits. Extremities: Symmetric 5 x 5 power. Skin: No rashes, lesions or ulcers. Psychiatry: Judgement and insight appear normal. Mood & affect appropriate.   Data Reviewed: I have personally reviewed following labs and imaging studies  CBC: Recent Labs  Lab 07/22/20 0119 07/22/20 2020 07/23/20 0031 07/24/20 0059 07/25/20 0037  WBC 12.5* 14.4* 11.6* 12.1* 9.4  NEUTROABS  --   --  10.8* 8.1* 5.5  HGB 10.1* 11.0* 10.3* 9.3* 9.1*  HCT 32.3* 34.9* 32.8* 29.2* 29.6*  MCV 98.2 98.9 96.8 98.0 97.4  PLT 672* 748* 759* 710* 124*   Basic Metabolic Panel: Recent Labs  Lab 07/21/20 0411 07/22/20 0119 07/23/20 0031 07/24/20 0059 07/25/20 0037  NA 139 135 133* 138 138  K 4.4 4.7 4.6 4.1 4.1  CL 100 97* 96* 103 104  CO2 $Re'30 28 26 26 26  'Sba$ GLUCOSE 102* 82 182* 109* 108*  BUN <5* $Remov'8 11 8 'CCYpkB$ <5*  CREATININE 0.73 0.74 0.88 0.90 0.85  CALCIUM 9.0 8.9 9.3 8.7* 8.9   GFR: Estimated Creatinine Clearance: 82.1 mL/min (by C-G formula based on SCr of 0.85 mg/dL). Liver Function Tests: Recent Labs  Lab 07/22/20 0119 07/23/20 0031 07/24/20 0059 07/25/20 0037  AST 11* 15 14* 21   ALT $Re'8 9 10 16  'IiD$ ALKPHOS 140* 175* 137* 154*  BILITOT 0.6 0.5 0.1* <0.1*  PROT 6.4* 6.9 6.1* 6.0*  ALBUMIN 2.3* 2.4* 2.4* 2.4*   Recent Labs  Lab 07/22/20 2020  LIPASE 92*  AMYLASE 154*   No results for input(s): AMMONIA in the last 168 hours. Coagulation Profile: Recent Labs  Lab 07/22/20 0119  INR 1.1   Cardiac Enzymes: No results for input(s): CKTOTAL, CKMB, CKMBINDEX, TROPONINI in the last 168 hours. BNP (last 3 results) No results for input(s): PROBNP in the last 8760 hours. HbA1C: No results for input(s): HGBA1C in the last 72 hours. CBG: Recent Labs  Lab 07/22/20 2020 07/23/20 0011 07/23/20 0428 07/23/20 0803 07/23/20 1129  GLUCAP 98 163* 135* 172* 97   Lipid Profile: No results for input(s): CHOL, HDL, LDLCALC, TRIG, CHOLHDL, LDLDIRECT in the last 72 hours. Thyroid Function Tests: No results for input(s): TSH, T4TOTAL, FREET4, T3FREE, THYROIDAB in the last 72 hours. Anemia Panel: No results for input(s): VITAMINB12, FOLATE, FERRITIN, TIBC, IRON, RETICCTPCT in the last 72 hours. Sepsis Labs: No results for input(s): PROCALCITON, LATICACIDVEN in the last 168 hours.  Recent Results (from the past 240 hour(s))  Anaerobic culture  Status: None (Preliminary result)   Collection Time: 07/22/20  5:03 PM   Specimen: PATH GI biopsy; Body Fluid  Result Value Ref Range Status   Specimen Description FLUID  Final   Special Requests PANCREACTIC CYST FLUID SPEC A  Final   Gram Stain   Final    FEW WBC PRESENT, PREDOMINANTLY MONONUCLEAR FEW GRAM NEGATIVE RODS RARE GRAM POSITIVE COCCI IN CLUSTERS RARE GRAM POSITIVE RODS    Culture   Final    HOLDING FOR POSSIBLE ANAEROBE Performed at [facility], [place] [address], [place], [place] [address]    Report Status PENDING  Incomplete  C Difficile Quick Screen (NO PCR Reflex)     Status: Abnormal   Collection Time: 07/25/20  2:37 PM   Specimen: Stool  Result Value Ref Range Status   C Diff antigen POSITIVE (A)  NEGATIVE Final   C Diff toxin NEGATIVE NEGATIVE Final   C Diff interpretation   Final    Results are indeterminate. Please contact your campus specific ID physician or [place].    Comment: Performed at [facility], [place] [address], [place], [place] [address]      Radiology Studies: No results found.  Scheduled Meds: . estradiol  1 mg Oral Daily  . feeding supplement  237 mL Oral BID BM  . fluconazole  200 mg Oral Daily  . lipase/protease/amylase  72,000 Units Oral TID WC  . mometasone-formoterol  2 puff Inhalation BID  . multivitamin with minerals  1 tablet Oral Daily  . potassium chloride  20 mEq Oral Daily  . sodium chloride flush  3 mL Intravenous Q12H   Continuous Infusions: . lactated ringers 50 mL/hr at 07/26/20 0151  . piperacillin-tazobactam (ZOSYN)  IV 3.375 g (07/26/20 0552)     LOS: 16 days   Time spent: 69 minutes   [facility]  If 7PM-7AM, please contact night-coverage [email or web address] 07/26/2020, 8:00 AM

## 2020-07-26 NOTE — Progress Notes (Signed)
Tricia Brown 9:31 AM  Subjective: Patient still with upper abdominal pain radiating to her chest and her diarrhea is slightly helped with Imodium and she believes the pancreatic enzymes help some and her pain is worse with eating and we discussed her questionable C. difficile and she does complain of some increased hemorrhoidal complaints but no other new issues and she has been walking in the hallway nicely  Objective: Patient looks well no acute distress lungs are clear decreased heart sounds no chest wall tenderness abdomen is softer and less tender than yesterday occasional bowel sounds no new labs C. difficile antigen positive with toxin negative  Assessment: Pancreatitis with pseudocyst that is post drainage and questionable C. difficile  Plan: We will go ahead and treat C. difficile with oral Vanco low-dose and repeat CT today instead of Sunday just to be sure and will ask my rounding partner to check on tomorrow  [facility]  office [phone] After 5PM or if no answer call [phone]

## 2020-07-27 DIAGNOSIS — K859 Acute pancreatitis without necrosis or infection, unspecified: Secondary | ICD-10-CM | POA: Diagnosis not present

## 2020-07-27 LAB — COMPREHENSIVE METABOLIC PANEL
ALT: 34 U/L (ref 0–44)
AST: 43 U/L — ABNORMAL HIGH (ref 15–41)
Albumin: 2.8 g/dL — ABNORMAL LOW (ref 3.5–5.0)
Alkaline Phosphatase: 260 U/L — ABNORMAL HIGH (ref 38–126)
Anion gap: 13 (ref 5–15)
BUN: <5 mg/dL — ABNORMAL LOW (ref 6–20)
CO2: 24 mmol/L (ref 22–32)
Calcium: 9.7 mg/dL (ref 8.9–10.3)
Chloride: 100 mmol/L (ref 98–111)
Creatinine, Ser: 0.86 mg/dL (ref 0.44–1.00)
GFR, Estimated: >60 mL/min (ref >60)
Glucose, Bld: 87 mg/dL (ref 70–99)
Potassium: 4 mmol/L (ref 3.5–5.1)
Sodium: 137 mmol/L (ref 135–145)
Total Bilirubin: 0.4 mg/dL (ref 0.3–1.2)
Total Protein: 6.9 g/dL (ref 6.5–8.1)

## 2020-07-27 LAB — CBC
HCT: 32 % — ABNORMAL LOW (ref 36.0–46.0)
Hemoglobin: 10.1 g/dL — ABNORMAL LOW (ref 12.0–15.0)
MCH: 30.5 pg (ref 26.0–34.0)
MCHC: 31.6 g/dL (ref 30.0–36.0)
MCV: 96.7 fL (ref 80.0–100.0)
Platelets: 763 10*3/uL — ABNORMAL HIGH (ref 150–400)
RBC: 3.31 MIL/uL — ABNORMAL LOW (ref 3.87–5.11)
RDW: 12.7 % (ref 11.5–15.5)
WBC: 8.4 10*3/uL (ref 4.0–10.5)
nRBC: 0 % (ref 0.0–0.2)

## 2020-07-27 NOTE — Progress Notes (Signed)
PROGRESS NOTE    Tricia Brown  DGU:[id number] DOB: 1976-06-08 DOA: 07/10/2020 PCP: Lorelee Market, MD   Brief Narrative:  44 y.o.femalewith medical history significant ofendometriosis status post diagnostic laparoscopy and hysterectomy, IBS-D, GERD, status post cholecystectomy presents with abdominal pain x4 days, epigastric and left upper quadrant with associated nausea, vomiting and diarrhea, pain rated 4 out of 10 and when flares of up to 8 out of 10 with certain movement and following meals, not improved by Tums and daily PPI at home.  Drinks alcohol about sixpack on occasion on weekends. In the ED vitals stable labs with hypokalemia 3.0 alk phos 230, leukocytosis 14 K lipase 151 troponin x2 - UA normal CT abdomen showed pancreatitis with fluid collection consistent with pseudocyst reported as "stranding noted around the pancreatic body and tail compatible with acute pancreatitis. Adjacent fluid collections, the largest 3.8 cm most compatible with acute fluid collections and developing pseudocysts. Secondary reactive inflammation of the adjacent gastric wall. Small to moderate free fluid in the pelvis" Patient was given IV fluids pain medication Zofran and admission was requested. GI was consulted.  Had MRCP no evidence of choledocholithiasis. Patient is  being treated for acute pancreatitis, possibly passed CBD stone; she underwent repeat CTA 10/25-that showed multiple pseudocyst pressing on the stomach-largest being 8.7 cm with significant mass-effect on the gastric body.  Assessment & Plan:  Acute pancreatitis pseudocyst formation: -Status post cyst gastrostomy on 07/22/2020-plastic double-pigtail stents placed. -Continue aggressive IV hydration and pain management w/ IV Dilaudid oxy, bentyl -Advancing diet per GI recommendations. -Hold Protonix -Pancreatic cyst fluid growing gram-negative rods, gram-positive cocci and rods.  Final culture result is pending.   -Continue Zosyn.  Remains afebrile, without leukocytosis -Continue on Creon -CT shows near complete decompression of pseudocyst  Esophageal candidiasis - Pathology remarkable for Candida, continue Diflucan per GI, discontinue nystatin  Cdiff Ag positive; toxin negative, likely indicative of previous infection - Defer to GI; currently on PO vancomycin  Fluid overload with leg edema and shortness of breath:  - Resolved after IV Lasix administration - Continue IV hydration and monitor signs of fluid overload  Loose stool intermittently:  -Likely multifactorial given above, improving with Creon and Imodium  -Continue IV fluids if unable to keep up with p.o. intake needs.   -Started on Creon  Hypokalemia: Resolved  Hypoalbuminemia in the setting of acute pancreatitis, improving Lab Results  Component Value Date   LABPROT 13.3 07/22/2020   Anemia of chronic disease, stable Thrombocytosis, ongoing -No signs of active bleeding.  Monitor H&H closely. Lab Results  Component Value Date   WBC 8.4 07/27/2020   HGB 10.1 (L) 07/27/2020   HCT 32.0 (L) 07/27/2020   MCV 96.7 07/27/2020   PLT 763 (H) 95/63/8756   Mild metabolic acidosis/dehydration due to #1.  Resolved. Chronic back pain, controlled, continue pain management Depression:  Stable  DVT prophylaxis: SCD, no chemical anticoagulation as per GI recommendations.  Code Status: Full code Family Communication:  None present at bedside.  Plan of care discussed with patient in length and he verbalized understanding and agreed with it. Disposition Plan: Home after the procedure  Consultants:   GI  Procedures:   As above  Antimicrobials:   Zosyn  Status is: Inpatient  Dispo: The patient is from: Home              Anticipated d/c is to: Home              Anticipated d/c date is: 07/28/2020  Patient currently not medically stable for the discharge  Subjective: No acute issues or events overnight, patient feels somewhat  improved compared to yesterday; diarrhea ongoing but slightly better - otherwise she denies overt chest pain, shortness of breath, nausea, vomiting, constipation, headache, fevers, chills.  Objective: Vitals:   07/26/20 0818 07/26/20 1410 07/26/20 2139 07/27/20 0504  BP:  130/87 126/88 121/77  Pulse:  90 81 81  Resp: $Remo'16 18 16 18  'BFwZL$ Temp:  97.9 F (36.6 C) 97.6 F (36.4 C)   TempSrc:  Oral Oral   SpO2:  100% 100% 100%  Weight:      Height:        Intake/Output Summary (Last 24 hours) at 07/27/2020 1704 Last data filed at 07/26/2020 2301 Gross per 24 hour  Intake 300 ml  Output --  Net 300 ml   Filed Weights   07/17/20 1500 07/22/20 1506  Weight: 63.5 kg 63.5 kg   General exam: Appears calm and comfortable, on room air, communicating well Respiratory system: Clear to auscultation. Respiratory effort normal. Cardiovascular system: S1 & S2 heard, RRR. No JVD, murmurs, rubs, gallops or clicks. No pedal edema. Gastrointestinal system: Epigastric tenderness ongoing, no guarding, no rigidity, bowel sounds positive. Central nervous system: Alert and oriented. No focal neurological deficits. Extremities: Symmetric 5 x 5 power. Skin: No rashes, lesions or ulcers. Psychiatry: Judgement and insight appear normal. Mood & affect appropriate.  Data Reviewed: I have personally reviewed following labs and imaging studies  CBC: Recent Labs  Lab 07/22/20 2020 07/23/20 0031 07/24/20 0059 07/25/20 0037 07/27/20 0909  WBC 14.4* 11.6* 12.1* 9.4 8.4  NEUTROABS  --  10.8* 8.1* 5.5  --   HGB 11.0* 10.3* 9.3* 9.1* 10.1*  HCT 34.9* 32.8* 29.2* 29.6* 32.0*  MCV 98.9 96.8 98.0 97.4 96.7  PLT 748* 759* 710* 645* 824*   Basic Metabolic Panel: Recent Labs  Lab 07/22/20 0119 07/23/20 0031 07/24/20 0059 07/25/20 0037 07/27/20 0909  NA 135 133* 138 138 137  K 4.7 4.6 4.1 4.1 4.0  CL 97* 96* 103 104 100  CO2 $Re'28 26 26 26 24  'VkH$ GLUCOSE 82 182* 109* 108* 87  BUN $Re'8 11 8 'pbh$ <5* <5*  CREATININE 0.74  0.88 0.90 0.85 0.86  CALCIUM 8.9 9.3 8.7* 8.9 9.7   GFR: Estimated Creatinine Clearance: 81.2 mL/min (by C-G formula based on SCr of 0.86 mg/dL). Liver Function Tests: Recent Labs  Lab 07/22/20 0119 07/23/20 0031 07/24/20 0059 07/25/20 0037 07/27/20 0909  AST 11* 15 14* 21 43*  ALT $Re'8 9 10 16 'lnQ$ 34  ALKPHOS 140* 175* 137* 154* 260*  BILITOT 0.6 0.5 0.1* <0.1* 0.4  PROT 6.4* 6.9 6.1* 6.0* 6.9  ALBUMIN 2.3* 2.4* 2.4* 2.4* 2.8*   Recent Labs  Lab 07/22/20 2020  LIPASE 92*  AMYLASE 154*   No results for input(s): AMMONIA in the last 168 hours. Coagulation Profile: Recent Labs  Lab 07/22/20 0119  INR 1.1   Cardiac Enzymes: No results for input(s): CKTOTAL, CKMB, CKMBINDEX, TROPONINI in the last 168 hours. BNP (last 3 results) No results for input(s): PROBNP in the last 8760 hours. HbA1C: No results for input(s): HGBA1C in the last 72 hours. CBG: Recent Labs  Lab 07/22/20 2020 07/23/20 0011 07/23/20 0428 07/23/20 0803 07/23/20 1129  GLUCAP 98 163* 135* 172* 97   Lipid Profile: No results for input(s): CHOL, HDL, LDLCALC, TRIG, CHOLHDL, LDLDIRECT in the last 72 hours. Thyroid Function Tests: No results for input(s): TSH, T4TOTAL, FREET4, T3FREE, THYROIDAB  in the last 72 hours. Anemia Panel: No results for input(s): VITAMINB12, FOLATE, FERRITIN, TIBC, IRON, RETICCTPCT in the last 72 hours. Sepsis Labs: No results for input(s): PROCALCITON, LATICACIDVEN in the last 168 hours.  Recent Results (from the past 240 hour(s))  Anaerobic culture     Status: None (Preliminary result)   Collection Time: 07/22/20  5:03 PM   Specimen: PATH GI biopsy; Body Fluid  Result Value Ref Range Status   Specimen Description FLUID  Final   Special Requests PANCREACTIC CYST FLUID SPEC A  Final   Gram Stain   Final    FEW WBC PRESENT, PREDOMINANTLY MONONUCLEAR FEW GRAM NEGATIVE RODS RARE GRAM POSITIVE COCCI IN CLUSTERS RARE GRAM POSITIVE RODS    Culture   Final    FEW CANDIDA  ALBICANS MODERATE LACTOBACILLUS SPECIES Standardized susceptibility testing for this organism is not available. HOLDING FOR POSSIBLE ANAEROBE Performed at [facility], [place] [address], [place], [place] [address]    Report Status PENDING  Incomplete  C Difficile Quick Screen (NO PCR Reflex)     Status: Abnormal   Collection Time: 07/25/20  2:37 PM   Specimen: Stool  Result Value Ref Range Status   C Diff antigen POSITIVE (A) NEGATIVE Final   C Diff toxin NEGATIVE NEGATIVE Final   C Diff interpretation   Final    Results are indeterminate. Please contact your campus specific ID physician or [place].    Comment: Performed at [facility], [place] [address], [place], [place] [address]      Radiology Studies: CT ABDOMEN PELVIS W CONTRAST  Result Date: 07/26/2020 CLINICAL DATA:  History of necrotizing pancreatitis with pseudocyst formation and prior drainage EXAM: CT ABDOMEN AND PELVIS WITH CONTRAST TECHNIQUE: Multidetector CT imaging of the abdomen and pelvis was performed using the standard protocol following bolus administration of intravenous contrast. CONTRAST:  149mL OMNIPAQUE IOHEXOL 350 MG/ML SOLN COMPARISON:  07/21/2020 FINDINGS: Lower chest: No acute abnormality. Hepatobiliary: Mild fatty infiltration of the liver is noted. The gallbladder has been surgically removed. Pancreas: Pancreas is well visualized. There are again noted several cystic lesions within the pancreas the largest of which measures approximately 12 mm in greatest dimension. This has decreased slightly in the interval from the prior exam at which time it measured almost 14 mm. Stable appearing 14 mm hypodensity is noted posterior to the stomach best seen on image number 16 of series 2. The larger retrogastric fluid collection seen on the prior exam appears to have resolved. The previously seen large infra pancreatic fluid collection now demonstrates evidence of cysto gastrostomy with multiple dual pigtail stents  extending through the metallic gastrostomy stent the larger drained collection has decompressed significantly with no significant residual fluid within. Mild wall thickening related to the cyst structure is seen surrounding the pigtail stents best noted on image number 37 of series 2. No new pancreatic pseudocyst is noted. Spleen: Normal in size without focal abnormality. Adrenals/Urinary Tract: Adrenal glands are within normal limits. Kidneys demonstrate a normal enhancement pattern bilaterally. No renal calculi or obstructive changes are seen. The bladder is well distended. Stomach/Bowel: Contrast material is noted throughout the colon. Scattered diverticular changes noted. No obstructive or inflammatory changes of the colon are seen. Some mild wall thickening is noted reactive in nature in the distal transverse colon related to the known previously seen pancreatic pseudocyst. The appendix is within normal limits. Small bowel is within normal limits. Stomach again demonstrates the previously described cysto gastrostomy stent with  2 pigtail catheter is extending from the pseudocyst into the stomach lumen. The pseudocyst is nearly completely decompressed. Vascular/Lymphatic: No significant vascular findings are present. No enlarged abdominal or pelvic lymph nodes. Reproductive: Status post hysterectomy. No adnexal masses. Other: No free fluid is noted. Musculoskeletal: No acute or significant osseous findings. IMPRESSION: Changes consistent with the known history of prior pancreatitis although no acute inflammatory changes are noted at this time. Multiple cystic lesions are noted associated with the tail of the pancreas all of which have decreased in size when compared with the prior exam. The largest of these shows changes of cysto gastrostomy with 2 pigtail stents extending from the stomach into the cyst. This has nearly completely decompressed the pseudocyst. Mild wall thickening within the distal transverse  colon related to localized inflammatory change related to the pancreatic pseudocyst. This is stable from the prior exam. Fatty liver. Electronically Signed   By: Inez Catalina M.D.   On: 07/26/2020 18:11    Scheduled Meds: . estradiol  1 mg Oral Daily  . feeding supplement  237 mL Oral BID BM  . fluconazole  200 mg Oral Daily  . hydrocortisone   Rectal TID  . lipase/protease/amylase  72,000 Units Oral TID WC  . mometasone-formoterol  2 puff Inhalation BID  . multivitamin with minerals  1 tablet Oral Daily  . potassium chloride  20 mEq Oral Daily  . sodium chloride flush  3 mL Intravenous Q12H  . vancomycin  125 mg Oral QID   Continuous Infusions: . lactated ringers 50 mL/hr at 07/27/20 1555  . piperacillin-tazobactam (ZOSYN)  IV 3.375 g (07/27/20 1352)     LOS: 17 days   Time spent: 72 minutes  [facility]  If 7PM-7AM, please contact night-coverage [email or web address] 07/27/2020, 5:04 PM

## 2020-07-27 NOTE — Progress Notes (Signed)
[facility] Progress Note  Tricia Brown 44 y.o. 09-24-1975   Subjective: Continues to have diarrhea. Abdominal pain ongoing. Sitting in bedside chair.  Objective: Vital signs: Vitals:   07/26/20 2139 07/27/20 0504  BP: 126/88 121/77  Pulse: 81 81  Resp: 16 18  Temp: 97.6 F (36.4 C)   SpO2: 100% 100%    Physical Exam: Gen: alert, no acute distress, thin HEENT: anicteric sclera CV: RRR Chest: CTA B Abd: diffuse tenderness with guarding, soft, mild distention, +BS Ext: no edema  Lab Results: Recent Labs    07/25/20 0037 07/27/20 0909  NA 138 137  K 4.1 4.0  CL 104 100  CO2 26 24  GLUCOSE 108* 87  BUN <5* <5*  CREATININE 0.85 0.86  CALCIUM 8.9 9.7   Recent Labs    07/25/20 0037 07/27/20 0909  AST 21 43*  ALT 16 34  ALKPHOS 154* 260*  BILITOT <0.1* 0.4  PROT 6.0* 6.9  ALBUMIN 2.4* 2.8*   Recent Labs    07/25/20 0037 07/27/20 0909  WBC 9.4 8.4  NEUTROABS 5.5  --   HGB 9.1* 10.1*  HCT 29.6* 32.0*  MCV 97.4 96.7  PLT 645* 763*      Assessment/Plan: Acute pancreatitis with pseudocysts and s/p cyst gastrostomy. Repeat CT shows near complete decompression of pseudocyst adjacent to stomach with 2 pigtail stents in place. No acute inflammation seen on yesterday's CT. Questionable C. Diff with negative toxin but positive antigen. Continue PO Vancomycin. Continue supportive care. Will follow.   [name] 07/27/2020, 12:42 PM  Questions please call [phone] ID: Tricia Brown, female   DOB: 1976/03/26, 44 y.o.   MRN: [id number]

## 2020-07-27 NOTE — Plan of Care (Signed)
  Problem: Education: Goal: Knowledge of General Education information will improve Description: Including pain rating scale, medication(s)/side effects and non-pharmacologic comfort measures Outcome: Progressing   Problem: Nutrition: Goal: Adequate nutrition will be maintained Outcome: Progressing   

## 2020-07-28 DIAGNOSIS — K859 Acute pancreatitis without necrosis or infection, unspecified: Secondary | ICD-10-CM | POA: Diagnosis not present

## 2020-07-28 MED ORDER — SODIUM CHLORIDE 0.9 % IV SOLN
3.0000 g | Freq: Four times a day (QID) | INTRAVENOUS | Status: DC
  Administered 2020-07-28 – 2020-07-29 (×4): 3 g via INTRAVENOUS
  Filled 2020-07-28: qty 8
  Filled 2020-07-28 (×2): qty 3
  Filled 2020-07-28: qty 8
  Filled 2020-07-28 (×2): qty 3

## 2020-07-28 MED ORDER — WHITE PETROLATUM EX OINT
TOPICAL_OINTMENT | CUTANEOUS | Status: AC
  Filled 2020-07-28: qty 28.35

## 2020-07-28 NOTE — Progress Notes (Signed)
PROGRESS NOTE    Tricia Brown  NKN:[id number] DOB: Apr 12, 1976 DOA: 07/10/2020 PCP: Lorelee Market, MD   Brief Narrative:  44 y.o.femalewith medical history significant ofendometriosis status post diagnostic laparoscopy and hysterectomy, IBS-D, GERD, status post cholecystectomy presents with abdominal pain x4 days, epigastric and left upper quadrant with associated nausea, vomiting and diarrhea, pain rated 4 out of 10 and when flares of up to 8 out of 10 with certain movement and following meals, not improved by Tums and daily PPI at home.  Drinks alcohol about sixpack on occasion on weekends. In the ED vitals stable labs with hypokalemia 3.0 alk phos 230, leukocytosis 14 K lipase 151 troponin x2 - UA normal CT abdomen showed pancreatitis with fluid collection consistent with pseudocyst reported as "stranding noted around the pancreatic body and tail compatible with acute pancreatitis. Adjacent fluid collections, the largest 3.8 cm most compatible with acute fluid collections and developing pseudocysts. Secondary reactive inflammation of the adjacent gastric wall. Small to moderate free fluid in the pelvis" Patient was given IV fluids pain medication Zofran and admission was requested.  GI was consulted.  Had MRCP no evidence of choledocholithiasis. Patient is  being treated for acute pancreatitis, possibly passed CBD stone; she underwent repeat CTA 10/25-that showed multiple pseudocyst pressing on the stomach-largest being 8.7 cm with significant mass-effect on the gastric body.  Patient continues to have profound diarrhea despite treatment for C. difficile positive antigen although toxin was negative, patient is on pancreatic enzymes as well as bile acid sequestrants in the setting of profound pancreatitis and pseudocyst.  Possible sigmoidoscopy in the next 24 hours per GI to evaluate for pseudomembranous colitis although patient indicates minimal improvement in symptoms he is still having  profound watery diarrhea despite treatment as above.  Assessment & Plan:  Acute pancreatitis pseudocyst formation: -Status post cyst gastrostomy on 07/22/2020-plastic double-pigtail stents placed. -Continue aggressive IV hydration and pain management w/ IV Dilaudid oxy, bentyl -Advancing diet per GI recommendations. -Hold Protonix -Pancreatic cyst fluid growing gram-negative rods, gram-positive cocci and rods.  Final culture result is pending.   -Continue Zosyn. Remains afebrile, without leukocytosis -Continue on Creon -Repeat CT 11/5 shows near complete decompression of pseudocyst  Esophageal candidiasis - Pathology remarkable for Candida, continue Diflucan per GI, discontinue nystatin  Cdiff Ag positive; toxin negative, likely indicative of previous infection - Defer to GI; currently on PO vancomycin  Fluid overload with leg edema and shortness of breath:  - Resolved after IV Lasix administration - Continue IV hydration and monitor signs of fluid overload  Loose stool intermittently:  - Likely multifactorial given above, improving with Creon and Imodium  - Continue IV fluids if unable to keep up with p.o. intake needs.   - Continue on Creon  Hypokalemia:  - Resolved  Hypoalbuminemia in the setting of acute pancreatitis, improving Lab Results  Component Value Date   LABPROT 13.3 07/22/2020   Anemia of chronic disease, stable Thrombocytosis, ongoing -No signs of active bleeding.  Monitor H&H closely. Lab Results  Component Value Date   WBC 8.4 07/27/2020   HGB 10.1 (L) 07/27/2020   HCT 32.0 (L) 07/27/2020   MCV 96.7 07/27/2020   PLT 763 (H) 37/90/2409   Mild metabolic acidosis/dehydration due to #1.  Resolved. Chronic back pain, controlled, continue pain management Depression:  Stable  DVT prophylaxis: SCD, no chemical anticoagulation as per GI recommendations.  Code Status: Full code Family Communication:  None present at bedside.  Plan of care discussed with  patient in length  and he verbalized understanding and agreed with it. Disposition Plan: Home after the procedure  Consultants:   GI  Procedures:   As above  Antimicrobials:   Zosyn  Status is: Inpatient  Dispo: The patient is from: Home              Anticipated d/c is to: Home              Anticipated d/c date is: 07/28/2020              Patient currently not medically stable for the discharge  Subjective: No acute issues or events overnight, patient feels somewhat improved compared to yesterday; diarrhea ongoing but slightly better - otherwise she denies overt chest pain, shortness of breath, nausea, vomiting, constipation, headache, fevers, chills.  Objective: Vitals:   07/27/20 0504 07/27/20 2032 07/28/20 0532 07/28/20 0536  BP: 121/77 122/82  (!) 149/93  Pulse: 81 (!) 105 95 90  Resp: _0 Temp:  98.1 F (36.7 C) (!) 97.3 F (36.3 C)   TempSrc:  Oral Oral   SpO2: 100% 100%  100%  Weight:      Height:        Intake/Output Summary (Last 24 hours) at 07/28/2020 0816 Last data filed at 07/27/2020 1709 Gross per 24 hour  Intake 900 ml  Output --  Net 900 ml   Filed Weights   07/17/20 1500 07/22/20 1506  Weight: 63.5 kg 63.5 kg   General exam: Appears calm and comfortable, on room air, communicating well Respiratory system: Clear to auscultation. Respiratory effort normal. Cardiovascular system: S1 & S2 heard, RRR. No JVD, murmurs, rubs, gallops or clicks. No pedal edema. Gastrointestinal system: Epigastric tenderness ongoing, no guarding, no rigidity, bowel sounds positive. Central nervous system: Alert and oriented. No focal neurological deficits. Extremities: Symmetric 5 x 5 power. Skin: No rashes, lesions or ulcers. Psychiatry: Judgement and insight appear normal. Mood & affect appropriate.  Data Reviewed: I have personally reviewed following labs and imaging studies  CBC: Recent Labs  Lab 07/22/20 2020 07/23/20 0031 07/24/20 0059 07/25/20 0037  07/27/20 0909  WBC 14.4* 11.6* 12.1* 9.4 8.4  NEUTROABS  --  10.8* 8.1* 5.5  --   HGB 11.0* 10.3* 9.3* 9.1* 10.1*  HCT 34.9* 32.8* 29.2* 29.6* 32.0*  MCV 98.9 96.8 98.0 97.4 96.7  PLT 748* 759* 710* 645* 250*   Basic Metabolic Panel: Recent Labs  Lab 07/22/20 0119 07/23/20 0031 07/24/20 0059 07/25/20 0037 07/27/20 0909  NA 135 133* 138 138 137  K 4.7 4.6 4.1 4.1 4.0  CL 97* 96* 103 104 100  CO2 _1 GLUCOSE 82 182* 109* 108* 87  BUN _2 <5* <5*  CREATININE 0.74 0.88 0.90 0.85 0.86  CALCIUM 8.9 9.3 8.7* 8.9 9.7   GFR: Estimated Creatinine Clearance: 81.2 mL/min (by C-G formula based on SCr of 0.86 mg/dL). Liver Function Tests: Recent Labs  Lab 07/22/20 0119 07/23/20 0031 07/24/20 0059 07/25/20 0037 07/27/20 0909  AST 11* 15 14* 21 43*  ALT _3 34  ALKPHOS 140* 175* 137* 154* 260*  BILITOT 0.6 0.5 0.1* <0.1* 0.4  PROT 6.4* 6.9 6.1* 6.0* 6.9  ALBUMIN 2.3* 2.4* 2.4* 2.4* 2.8*   Recent Labs  Lab 07/22/20 2020  LIPASE 92*  AMYLASE 154*   No results for input(s): AMMONIA in the last 168 hours. Coagulation Profile: Recent Labs  Lab 07/22/20 0119  INR 1.1   Cardiac  Enzymes: No results for input(s): CKTOTAL, CKMB, CKMBINDEX, TROPONINI in the last 168 hours. BNP (last 3 results) No results for input(s): PROBNP in the last 8760 hours. HbA1C: No results for input(s): HGBA1C in the last 72 hours. CBG: Recent Labs  Lab 07/22/20 2020 07/23/20 0011 07/23/20 0428 07/23/20 0803 07/23/20 1129  GLUCAP 98 163* 135* 172* 97   Lipid Profile: No results for input(s): CHOL, HDL, LDLCALC, TRIG, CHOLHDL, LDLDIRECT in the last 72 hours. Thyroid Function Tests: No results for input(s): TSH, T4TOTAL, FREET4, T3FREE, THYROIDAB in the last 72 hours. Anemia Panel: No results for input(s): VITAMINB12, FOLATE, FERRITIN, TIBC, IRON, RETICCTPCT in the last 72 hours. Sepsis Labs: No results for input(s): PROCALCITON, LATICACIDVEN in the last 168  hours.  Recent Results (from the past 240 hour(s))  Anaerobic culture     Status: None (Preliminary result)   Collection Time: 07/22/20  5:03 PM   Specimen: PATH GI biopsy; Body Fluid  Result Value Ref Range Status   Specimen Description FLUID  Final   Special Requests PANCREACTIC CYST FLUID SPEC A  Final   Gram Stain   Final    FEW WBC PRESENT, PREDOMINANTLY MONONUCLEAR FEW GRAM NEGATIVE RODS RARE GRAM POSITIVE COCCI IN CLUSTERS RARE GRAM POSITIVE RODS    Culture   Final    FEW CANDIDA ALBICANS MODERATE LACTOBACILLUS SPECIES Standardized susceptibility testing for this organism is not available. HOLDING FOR POSSIBLE ANAEROBE Performed at [facility], [place] [address], [place], [place] [address]    Report Status PENDING  Incomplete  C Difficile Quick Screen (NO PCR Reflex)     Status: Abnormal   Collection Time: 07/25/20  2:37 PM   Specimen: Stool  Result Value Ref Range Status   C Diff antigen POSITIVE (A) NEGATIVE Final   C Diff toxin NEGATIVE NEGATIVE Final   C Diff interpretation   Final    Results are indeterminate. Please contact your campus specific ID physician or [place].    Comment: Performed at [facility], [place] [address], [place], [place] [address]      Radiology Studies: CT ABDOMEN PELVIS W CONTRAST  Result Date: 07/26/2020 CLINICAL DATA:  History of necrotizing pancreatitis with pseudocyst formation and prior drainage EXAM: CT ABDOMEN AND PELVIS WITH CONTRAST TECHNIQUE: Multidetector CT imaging of the abdomen and pelvis was performed using the standard protocol following bolus administration of intravenous contrast. CONTRAST:  140m OMNIPAQUE IOHEXOL 350 MG/ML SOLN COMPARISON:  07/21/2020 FINDINGS: Lower chest: No acute abnormality. Hepatobiliary: Mild fatty infiltration of the liver is noted. The gallbladder has been surgically removed. Pancreas: Pancreas is well visualized. There are again noted several cystic lesions within the pancreas the largest of  which measures approximately 12 mm in greatest dimension. This has decreased slightly in the interval from the prior exam at which time it measured almost 14 mm. Stable appearing 14 mm hypodensity is noted posterior to the stomach best seen on image number 16 of series 2. The larger retrogastric fluid collection seen on the prior exam appears to have resolved. The previously seen large infra pancreatic fluid collection now demonstrates evidence of cysto gastrostomy with multiple dual pigtail stents extending through the metallic gastrostomy stent the larger drained collection has decompressed significantly with no significant residual fluid within. Mild wall thickening related to the cyst structure is seen surrounding the pigtail stents best noted on image number 37 of series 2. No new pancreatic pseudocyst is noted. Spleen: Normal in size without focal abnormality. Adrenals/Urinary Tract:  Adrenal glands are within normal limits. Kidneys demonstrate a normal enhancement pattern bilaterally. No renal calculi or obstructive changes are seen. The bladder is well distended. Stomach/Bowel: Contrast material is noted throughout the colon. Scattered diverticular changes noted. No obstructive or inflammatory changes of the colon are seen. Some mild wall thickening is noted reactive in nature in the distal transverse colon related to the known previously seen pancreatic pseudocyst. The appendix is within normal limits. Small bowel is within normal limits. Stomach again demonstrates the previously described cysto gastrostomy stent with 2 pigtail catheter is extending from the pseudocyst into the stomach lumen. The pseudocyst is nearly completely decompressed. Vascular/Lymphatic: No significant vascular findings are present. No enlarged abdominal or pelvic lymph nodes. Reproductive: Status post hysterectomy. No adnexal masses. Other: No free fluid is noted. Musculoskeletal: No acute or significant osseous findings. IMPRESSION:  Changes consistent with the known history of prior pancreatitis although no acute inflammatory changes are noted at this time. Multiple cystic lesions are noted associated with the tail of the pancreas all of which have decreased in size when compared with the prior exam. The largest of these shows changes of cysto gastrostomy with 2 pigtail stents extending from the stomach into the cyst. This has nearly completely decompressed the pseudocyst. Mild wall thickening within the distal transverse colon related to localized inflammatory change related to the pancreatic pseudocyst. This is stable from the prior exam. Fatty liver. Electronically Signed   By: Inez Catalina M.D.   On: 07/26/2020 18:11    Scheduled Meds: . estradiol  1 mg Oral Daily  . feeding supplement  237 mL Oral BID BM  . fluconazole  200 mg Oral Daily  . hydrocortisone   Rectal TID  . lipase/protease/amylase  72,000 Units Oral TID WC  . mometasone-formoterol  2 puff Inhalation BID  . multivitamin with minerals  1 tablet Oral Daily  . potassium chloride  20 mEq Oral Daily  . sodium chloride flush  3 mL Intravenous Q12H  . vancomycin  125 mg Oral QID   Continuous Infusions: . lactated ringers 50 mL/hr at 07/27/20 1555  . piperacillin-tazobactam (ZOSYN)  IV 3.375 g (07/28/20 0525)     LOS: 18 days   Time spent: 92 minutes  [facility]  If 7PM-7AM, please contact night-coverage [email or web address] 07/28/2020, 8:16 AM

## 2020-07-28 NOTE — Progress Notes (Addendum)
[facility] Gastroenterology Progress Note  Tricia Brown 44 y.o. [birth year]   Subjective: Continues to have profuse nonbloody diarrhea despite PO Vancomycin. No help with imodium. Abdominal pain controlled with meds. Tolerates clear liquids better than full liquids.  Objective: Vital signs: Vitals:   07/28/20 0532 07/28/20 0536  BP:  (!) 149/93  Pulse: 95 90  Resp:  20  Temp: (!) 97.3 F (36.3 C)   SpO2:  100%    Physical Exam: Gen: alert, no acute distress, thin  HEENT: anicteric sclera CV: RRR Chest: CTA B Abd: diffuse tenderness with guarding, mild distention, soft, +BS Ext: no edema  Lab Results: Recent Labs    07/27/20 0909  NA 137  K 4.0  CL 100  CO2 24  GLUCOSE 87  BUN <5*  CREATININE 0.86  CALCIUM 9.7   Recent Labs    07/27/20 0909  AST 43*  ALT 34  ALKPHOS 260*  BILITOT 0.4  PROT 6.9  ALBUMIN 2.8*   Recent Labs    07/27/20 0909  WBC 8.4  HGB 10.1*  HCT 32.0*  MCV 96.7  PLT 763*      Assessment/Plan: Acute pancreatitis with pseudocysts requiring cyst gastrostomy 07/22/20. Resolution of largest pseudocysts following drainage. Received nasjojejunal tube feeds last week but discontinued.On Creon. Has been having chronic diarrhea that is likely multi-factorial but question of C. Diff. Negative toxin with positive antigen so may be due to previous infection. Started on PO Vanco without benefit. Reports no improvement with Imodium but stopping Imodium for now. Unprepped flexible sigmoidoscopy tomorrow to further evaluate chronic diarrhea and look for pseudomembranous colitis. NPO p MN.   [name] 07/28/2020, 1:45 PM  Questions please call [phone] ID: Tricia Brown, female   DOB: 1975-10-29, 44 y.o.   MRN: [id number]

## 2020-07-28 NOTE — Progress Notes (Signed)
Pharmacy Antibiotic Note  Tricia Brown is a 44 y.o. female admitted on 07/10/2020 with pancreatitis complicated by pseudocyst formation.  Cultures now growing Lactobacillus and holding for possible anaerobe. Patient is afebrile with Wbc WNL. Pharmacy has been consulted for Unasyn dosing. Will start patient on Unasyn 3g q6h given stable renal function (CrCl ~81 ml/min).    Plan: Discontinue Zosyn Initiate amp/sulbactam 3g q6hr  Follow renal function, culture data, and clinical progress.  Noted plan to continue antibiotics until stent removed, and to transition to oral antibiotics when able.  Height: 5\' 7"  (170.2 cm) Weight: 63.5 kg (140 lb) IBW/kg (Calculated) : 61.6  Temp (24hrs), Avg:97.7 F (36.5 C), Min:97.3 F (36.3 C), Max:98.1 F (36.7 C)  Recent Labs  Lab 07/22/20 0119 07/22/20 0119 07/22/20 2020 07/23/20 0031 07/24/20 0059 07/25/20 0037 07/27/20 0909  WBC 12.5*   < > 14.4* 11.6* 12.1* 9.4 8.4  CREATININE 0.74  --   --  0.88 0.90 0.85 0.86   < > = values in this interval not displayed.    Estimated Creatinine Clearance: 81.2 mL/min (by C-G formula based on SCr of 0.86 mg/dL).    No Known Allergies  Antimicrobials this admission: 11/1 Zosyn >> 11/5 Vanc po 125 >> (11/15) 11/4 fluc 200 po  >>   Microbiology results:  10/21 COVID and flu: negative 11/1 cyst cx: few C. Albs, Mod Lactobacillus, holding for possible anaerobe   Thank you for allowing pharmacy to be a part of this patient's care.  13/1, PharmD PGY2 ID Pharmacy Resident Phone between 7 am - 3:30 pm: Margarite Gouge  Please check AMION for all [facility] Pharmacy phone numbers After 10:00 PM, call Main Pharmacy [phone]  07/28/2020 10:53 AM

## 2020-07-29 ENCOUNTER — Encounter (HOSPITAL_COMMUNITY): Payer: Self-pay | Admitting: Internal Medicine

## 2020-07-29 ENCOUNTER — Inpatient Hospital Stay (HOSPITAL_COMMUNITY): Payer: Medicaid Other | Admitting: Anesthesiology

## 2020-07-29 ENCOUNTER — Encounter (HOSPITAL_COMMUNITY)
Admission: EM | Disposition: A | Discharge status: Home / Self Care | Payer: Self-pay | Source: Home / Self Care | Attending: Internal Medicine

## 2020-07-29 DIAGNOSIS — K859 Acute pancreatitis without necrosis or infection, unspecified: Secondary | ICD-10-CM | POA: Diagnosis not present

## 2020-07-29 HISTORY — PX: BIOPSY: SHX5522

## 2020-07-29 HISTORY — PX: FLEXIBLE SIGMOIDOSCOPY: SHX5431

## 2020-07-29 LAB — COMPREHENSIVE METABOLIC PANEL
ALT: 37 U/L (ref 0–44)
AST: 30 U/L (ref 15–41)
Albumin: 2.8 g/dL — ABNORMAL LOW (ref 3.5–5.0)
Alkaline Phosphatase: 243 U/L — ABNORMAL HIGH (ref 38–126)
Anion gap: 11 (ref 5–15)
BUN: <5 mg/dL — ABNORMAL LOW (ref 6–20)
CO2: 24 mmol/L (ref 22–32)
Calcium: 9.6 mg/dL (ref 8.9–10.3)
Chloride: 104 mmol/L (ref 98–111)
Creatinine, Ser: 0.79 mg/dL (ref 0.44–1.00)
GFR, Estimated: >60 mL/min (ref >60)
Glucose, Bld: 80 mg/dL (ref 70–99)
Potassium: 3.9 mmol/L (ref 3.5–5.1)
Sodium: 139 mmol/L (ref 135–145)
Total Bilirubin: 0.4 mg/dL (ref 0.3–1.2)
Total Protein: 6.8 g/dL (ref 6.5–8.1)

## 2020-07-29 LAB — CBC
HCT: 32.6 % — ABNORMAL LOW (ref 36.0–46.0)
Hemoglobin: 10.3 g/dL — ABNORMAL LOW (ref 12.0–15.0)
MCH: 30.2 pg (ref 26.0–34.0)
MCHC: 31.6 g/dL (ref 30.0–36.0)
MCV: 95.6 fL (ref 80.0–100.0)
Platelets: 722 10*3/uL — ABNORMAL HIGH (ref 150–400)
RBC: 3.41 MIL/uL — ABNORMAL LOW (ref 3.87–5.11)
RDW: 13.2 % (ref 11.5–15.5)
WBC: 9 10*3/uL (ref 4.0–10.5)
nRBC: 0 % (ref 0.0–0.2)

## 2020-07-29 SURGERY — SIGMOIDOSCOPY, FLEXIBLE
Anesthesia: Monitor Anesthesia Care

## 2020-07-29 MED ORDER — PHENYLEPHRINE 40 MCG/ML (10ML) SYRINGE FOR IV PUSH (FOR BLOOD PRESSURE SUPPORT)
PREFILLED_SYRINGE | INTRAVENOUS | Status: DC | PRN
  Administered 2020-07-29: 80 ug via INTRAVENOUS

## 2020-07-29 MED ORDER — LACTATED RINGERS IV SOLN: Freq: Once | INTRAVENOUS | Status: AC

## 2020-07-29 MED ORDER — LIDOCAINE 2% (20 MG/ML) 5 ML SYRINGE
INTRAMUSCULAR | Status: DC | PRN
  Administered 2020-07-29: 60 mg via INTRAVENOUS

## 2020-07-29 MED ORDER — PIPERACILLIN-TAZOBACTAM 3.375 G IVPB
3.3750 g | Freq: Three times a day (TID) | INTRAVENOUS | Status: DC
  Administered 2020-07-29 – 2020-08-02 (×12): 3.375 g via INTRAVENOUS
  Filled 2020-07-29 (×11): qty 50

## 2020-07-29 MED ORDER — SODIUM CHLORIDE 0.9 % IV SOLN: INTRAVENOUS | Status: DC

## 2020-07-29 MED ORDER — PROPOFOL 10 MG/ML IV BOLUS
INTRAVENOUS | Status: DC | PRN
  Administered 2020-07-29 (×3): 40 mg via INTRAVENOUS

## 2020-07-29 MED ORDER — PROPOFOL 500 MG/50ML IV EMUL
INTRAVENOUS | Status: DC | PRN
  Administered 2020-07-29: 150 ug/kg/min via INTRAVENOUS

## 2020-07-29 MED ORDER — FENTANYL CITRATE (PF) 100 MCG/2ML IJ SOLN
INTRAMUSCULAR | Status: DC | PRN
Start: 2020-07-29 — End: 2020-07-29
  Administered 2020-07-29: 25 ug via INTRAVENOUS

## 2020-07-29 MED ORDER — DIPHENOXYLATE-ATROPINE 2.5-0.025 MG/5ML PO LIQD
5.0000 mL | Freq: Three times a day (TID) | ORAL | Status: DC
  Administered 2020-07-29 – 2020-08-07 (×26): 5 mL via ORAL
  Filled 2020-07-29 (×27): qty 5

## 2020-07-29 NOTE — Anesthesia Postprocedure Evaluation (Signed)
Anesthesia Post Note  Patient: Tricia Brown  Procedure(s) Performed: FLEXIBLE SIGMOIDOSCOPY (N/A ) BIOPSY     Patient location during evaluation: Endoscopy Anesthesia Type: MAC Level of consciousness: awake and alert Pain management: pain level controlled Vital Signs Assessment: post-procedure vital signs reviewed and stable Respiratory status: spontaneous breathing, nonlabored ventilation, respiratory function stable and patient connected to nasal cannula oxygen Cardiovascular status: blood pressure returned to baseline and stable Postop Assessment: no apparent nausea or vomiting Anesthetic complications: no   No complications documented.  Last Vitals:  Vitals:   07/29/20 1329 07/29/20 1352  BP: 120/66 133/75  Pulse: 91 90  Resp: 15 16  Temp:  36.5 C  SpO2: 100% 100%    Last Pain:  Vitals:   07/29/20 1329  TempSrc:   PainSc: 7                  Kyndall Chaplin L Ronie Barnhart

## 2020-07-29 NOTE — Interval H&P Note (Signed)
History and Physical Interval Note:  07/29/2020 12:52 PM  Tricia Brown  has presented today for surgery, with the diagnosis of Diarrhea.  The various methods of treatment have been discussed with the patient and family. After consideration of risks, benefits and other options for treatment, the patient has consented to  Procedure(s): FLEXIBLE SIGMOIDOSCOPY (N/A) as a surgical intervention.  The patient's history has been reviewed, patient examined, no change in status, stable for surgery.  I have reviewed the patient's chart and labs.  Questions were answered to the patient's satisfaction.     Freddy Jaksch

## 2020-07-29 NOTE — Anesthesia Preprocedure Evaluation (Addendum)
Anesthesia Evaluation  Patient identified by MRN, date of birth, ID band Patient awake    Reviewed: Allergy & Precautions, NPO status , Patient's Chart, lab work & pertinent test results  Airway Mallampati: II  TM Distance: >3 FB Neck ROM: Full    Dental  (+) Edentulous Upper, Edentulous Lower, Upper Dentures, Lower Dentures   Pulmonary neg pulmonary ROS, Current Smoker,    Pulmonary exam normal breath sounds clear to auscultation       Cardiovascular negative cardio ROS Normal cardiovascular exam Rhythm:Regular Rate:Normal     Neuro/Psych  Headaches, PSYCHIATRIC DISORDERS Depression    GI/Hepatic negative GI ROS, (+)     substance abuse  ,   Endo/Other  negative endocrine ROS  Renal/GU negative Renal ROS  negative genitourinary   Musculoskeletal negative musculoskeletal ROS (+) narcotic dependent  Abdominal   Peds  Hematology  (+) Blood dyscrasia (Hgb 10.3), anemia ,   Anesthesia Other Findings Flex sig for diarrhea, N/V in setting of pancreatitis  Reproductive/Obstetrics                           Anesthesia Physical Anesthesia Plan  ASA: III  Anesthesia Plan: MAC   Post-op Pain Management:    Induction: Intravenous  PONV Risk Score and Plan: Propofol infusion and Treatment may vary due to age or medical condition  Airway Management Planned: Natural Airway  Additional Equipment:   Intra-op Plan:   Post-operative Plan:   Informed Consent: I have reviewed the patients History and Physical, chart, labs and discussed the procedure including the risks, benefits and alternatives for the proposed anesthesia with the patient or authorized representative who has indicated his/her understanding and acceptance.     Dental advisory given  Plan Discussed with: CRNA  Anesthesia Plan Comments:        Anesthesia Quick Evaluation

## 2020-07-29 NOTE — Anesthesia Procedure Notes (Signed)
Procedure Name: MAC Date/Time: 07/29/2020 1:30 PM Performed by: Rande Brunt, CRNA Pre-anesthesia Checklist: Patient identified, Emergency Drugs available, Suction available and Patient being monitored Patient Re-evaluated:Patient Re-evaluated prior to induction Induction Type: IV induction Placement Confirmation: positive ETCO2 and breath sounds checked- equal and bilateral

## 2020-07-29 NOTE — Op Note (Signed)
[facility] Patient Name: Tricia Brown Procedure Date : 07/29/2020 MRN: [id number] Attending MD: Willis Modena , MD Date of Birth: [birth year] CSN: [id number] Age: 44 Admit Type: Inpatient Procedure:                Flexible Sigmoidoscopy Indications:              Diarrhea Providers:                Willis Modena, MD, Fayrene Fearing, RN, Michele Mcalpine                            Technician Referring MD:              Medicines:                Monitored Anesthesia Care Complications:            No immediate complications. Estimated Blood Loss:     Estimated blood loss: none. Procedure:                Pre-Anesthesia Assessment:                           - Prior to the procedure, a History and Physical                            was performed, and patient medications and                            allergies were reviewed. The patient's tolerance of                            previous anesthesia was also reviewed. The risks                            and benefits of the procedure and the sedation                            options and risks were discussed with the patient.                            All questions were answered, and informed consent                            was obtained. Prior Anticoagulants: The patient has                            taken no previous anticoagulant or antiplatelet                            agents. ASA Grade Assessment: III - A patient with                            severe systemic disease. After reviewing the risks                            and benefits,  the patient was deemed in                            satisfactory condition to undergo the procedure.                           After obtaining informed consent, the scope was                            passed under direct vision. The GIF-H190 ([id number])                            Olympus gastroscope was introduced through the anus                            and advanced to the the  descending colon. The                            flexible sigmoidoscopy was accomplished without                            difficulty. The patient tolerated the procedure                            well. The quality of the bowel preparation was fair. Scope In: Scope Out: Findings:      Hemorrhoids were found on perianal exam.      Internal hemorrhoids were found during retroflexion. The hemorrhoids       were mild.      A moderate amount of semi-solid stool was found in the rectum, in the       sigmoid colon and in the descending colon, making visualization       difficult. Biopsies were taken with a cold forceps for histology.      No colitis or other inflammatory changes seen. No pseudomembranes or       features of pseudomembranous colitis were seen. Impression:               - Preparation of the colon was fair.                           - Hemorrhoids found on perianal exam.                           - Internal hemorrhoids.                           - Semi-solid stool in the rectum, in the sigmoid                            colon and in the descending colon. Biopsied.                           - No evidence of colitis or pseudomembranes. Recommendation:           - Return patient to hospital ward for ongoing care.                           -  Await pathology results.                           [facility] will follow. Procedure Code(s):        --- Professional ---                           [phone], Sigmoidoscopy, flexible; with biopsy, single                            or multiple Diagnosis Code(s):        --- Professional ---                           K64.8, Other hemorrhoids                           R19.7, Diarrhea, unspecified CPT copyright 2019 [employer]. All rights reserved. The codes documented in this report are preliminary and upon coder review may  be revised to meet current compliance requirements. Willis Modena, MD 07/29/2020 1:26:51 PM This report has  been signed electronically. Number of Addenda: 0

## 2020-07-29 NOTE — Progress Notes (Signed)
PROGRESS NOTE    Tricia Brown  NFA:[id number] DOB: 1976/08/25 DOA: 07/10/2020 PCP: Lorelee Market, MD   Brief Narrative:  44 y.o.femalewith medical history significant ofendometriosis status post diagnostic laparoscopy and hysterectomy, IBS-D, GERD, status post cholecystectomy presents with abdominal pain x4 days, epigastric and left upper quadrant with associated nausea, vomiting and diarrhea, pain rated 4 out of 10 and when flares of up to 8 out of 10 with certain movement and following meals, not improved by Tums and daily PPI at home.  Drinks alcohol about sixpack on occasion on weekends. In the ED vitals stable labs with hypokalemia 3.0 alk phos 230, leukocytosis 14 K lipase 151 troponin x2 - UA normal CT abdomen showed pancreatitis with fluid collection consistent with pseudocyst reported as "stranding noted around the pancreatic body and tail compatible with acute pancreatitis. Adjacent fluid collections, the largest 3.8 cm most compatible with acute fluid collections and developing pseudocysts. Secondary reactive inflammation of the adjacent gastric wall. Small to moderate free fluid in the pelvis" Patient was given IV fluids pain medication Zofran and admission was requested.  GI was consulted.  Had MRCP no evidence of choledocholithiasis. Patient is  being treated for acute pancreatitis, possibly passed CBD stone; she underwent repeat CTA 10/25-that showed multiple pseudocyst pressing on the stomach-largest being 8.7 cm with significant mass-effect on the gastric body.  Patient continues to have profound diarrhea despite treatment for C. difficile positive antigen although toxin was negative, patient is on pancreatic enzymes as well as bile acid sequestrants in the setting of profound pancreatitis and pseudocyst.  Possible sigmoidoscopy in the next 24 hours per GI to evaluate for pseudomembranous colitis although patient indicates minimal improvement in symptoms he is still having  profound watery diarrhea despite treatment as above.  Assessment & Plan:  Acute pancreatitis pseudocyst formation: -Status post cyst gastrostomy on 07/22/2020-plastic double-pigtail stents placed. -Continue aggressive IV hydration and pain management w/ IV Dilaudid oxy, bentyl -Advancing diet per GI recommendations. -Hold Protonix -Pancreatic cyst fluid growing few Candida, moderate lactobacillus and Serratia marcescens -continue Zosyn -Continue on Creon -Repeat CT 11/5 shows ongoing improvement of pseudocyst  Esophageal candidiasis - Pathology remarkable for Candida, continue Diflucan per GI, discontinue nystatin  Cdiff Ag positive; toxin negative, likely indicative of previous infection - Defer to GI; currently on PO vancomycin -Flex sig later today to evaluate for pseudomembranous colitis  Fluid overload with leg edema and shortness of breath:  - Resolved after IV Lasix administration - Continue IV hydration and monitor signs of fluid overload  Loose stool/diarrhea, profound:  - Likely multifactorial given above, improving minimally with Creon and Imodium  - Continue IV fluids if unable to keep up with p.o. intake needs.   - Patient indicates marked amounts of ongoing diarrhea,, this is not seen in nursing documentation.  Will have more accurate I's and O's given patient does not appear dehydrated or have hypokalemia which would be expected if she were having upwards of 20 loose stool bowel movements per day as she reports.  Hypokalemia:  - Resolved  Hypoalbuminemia in the setting of acute pancreatitis, improving Lab Results  Component Value Date   LABPROT 13.3 07/22/2020   Anemia of chronic disease, stable Thrombocytosis, ongoing -No signs of active bleeding.  Monitor H&H closely. Lab Results  Component Value Date   WBC 9.0 07/29/2020   HGB 10.3 (L) 07/29/2020   HCT 32.6 (L) 07/29/2020   MCV 95.6 07/29/2020   PLT 722 (H) 46/96/2952   Mild metabolic  acidosis/dehydration due  to #1.  Resolved. Chronic back pain, controlled, continue pain management Depression:  Stable  DVT prophylaxis: SCD, no chemical anticoagulation as per GI recommendations.  Code Status: Full code Family Communication:  None present at bedside.  Plan of care discussed with patient in length and he verbalized understanding and agreed with it. Disposition Plan: Home after the procedure  Consultants:   GI  Procedures:   As above  Antimicrobials:   Zosyn  Status is: Inpatient  Dispo: The patient is from: Home              Anticipated d/c is to: Home              Anticipated d/c date is: 07/28/2020              Patient currently not medically stable for the discharge  Subjective: No acute issues or events overnight, patient continues to complain of profound diarrhea, not noted on I's and O's, she states she feels somewhat improved from previous, looking forward to endoscopy to help evaluate diarrhea.  Denies constipation headache fevers or chills.  Objective: Vitals:   07/28/20 0536 07/28/20 1512 07/28/20 2002 07/29/20 0445  BP: (!) 149/93 121/74 135/76 114/82  Pulse: 90 87 99 80  Resp: $Remo'20 17 18 16  'WxNJc$ Temp:  (!) 96.7 F (35.9 C)  (!) 97.3 F (36.3 C)  TempSrc:  Oral    SpO2: 100% 100% 100% 98%  Weight:      Height:        Intake/Output Summary (Last 24 hours) at 07/29/2020 0755 Last data filed at 07/29/2020 0636 Gross per 24 hour  Intake 800 ml  Output --  Net 800 ml   Filed Weights   07/17/20 1500 07/22/20 1506  Weight: 63.5 kg 63.5 kg   General exam: Appears calm and comfortable, on room air, communicating well Respiratory system: Clear to auscultation. Respiratory effort normal. Cardiovascular system: S1 & S2 heard, RRR. No JVD, murmurs, rubs, gallops or clicks. No pedal edema. Gastrointestinal system: Epigastric tenderness mild but ongoing, no guarding, no rigidity, bowel sounds positive. Central nervous system: Alert and oriented. No  focal neurological deficits. Extremities: Symmetric 5 x 5 power. Skin: No rashes, lesions or ulcers. Psychiatry: Judgement and insight appear normal. Mood & affect appropriate.  Data Reviewed: I have personally reviewed following labs and imaging studies  CBC: Recent Labs  Lab 07/23/20 0031 07/24/20 0059 07/25/20 0037 07/27/20 0909 07/29/20 0543  WBC 11.6* 12.1* 9.4 8.4 9.0  NEUTROABS 10.8* 8.1* 5.5  --   --   HGB 10.3* 9.3* 9.1* 10.1* 10.3*  HCT 32.8* 29.2* 29.6* 32.0* 32.6*  MCV 96.8 98.0 97.4 96.7 95.6  PLT 759* 710* 645* 763* 269*   Basic Metabolic Panel: Recent Labs  Lab 07/23/20 0031 07/24/20 0059 07/25/20 0037 07/27/20 0909 07/29/20 0543  NA 133* 138 138 137 139  K 4.6 4.1 4.1 4.0 3.9  CL 96* 103 104 100 104  CO2 $Re'26 26 26 24 24  'wkD$ GLUCOSE 182* 109* 108* 87 80  BUN 11 8 <5* <5* <5*  CREATININE 0.88 0.90 0.85 0.86 0.79  CALCIUM 9.3 8.7* 8.9 9.7 9.6   GFR: Estimated Creatinine Clearance: 87.3 mL/min (by C-G formula based on SCr of 0.79 mg/dL). Liver Function Tests: Recent Labs  Lab 07/23/20 0031 07/24/20 0059 07/25/20 0037 07/27/20 0909 07/29/20 0543  AST 15 14* 21 43* 30  ALT $Re'9 10 16 'uJh$ 34 37  ALKPHOS 175* 137* 154* 260* 243*  BILITOT 0.5 0.1* <0.1*  0.4 0.4  PROT 6.9 6.1* 6.0* 6.9 6.8  ALBUMIN 2.4* 2.4* 2.4* 2.8* 2.8*   Recent Labs  Lab 07/22/20 2020  LIPASE 92*  AMYLASE 154*   No results for input(s): AMMONIA in the last 168 hours. Coagulation Profile: No results for input(s): INR, PROTIME in the last 168 hours. Cardiac Enzymes: No results for input(s): CKTOTAL, CKMB, CKMBINDEX, TROPONINI in the last 168 hours. BNP (last 3 results) No results for input(s): PROBNP in the last 8760 hours. HbA1C: No results for input(s): HGBA1C in the last 72 hours. CBG: Recent Labs  Lab 07/22/20 2020 07/23/20 0011 07/23/20 0428 07/23/20 0803 07/23/20 1129  GLUCAP 98 163* 135* 172* 97   Lipid Profile: No results for input(s): CHOL, HDL, LDLCALC, TRIG,  CHOLHDL, LDLDIRECT in the last 72 hours. Thyroid Function Tests: No results for input(s): TSH, T4TOTAL, FREET4, T3FREE, THYROIDAB in the last 72 hours. Anemia Panel: No results for input(s): VITAMINB12, FOLATE, FERRITIN, TIBC, IRON, RETICCTPCT in the last 72 hours. Sepsis Labs: No results for input(s): PROCALCITON, LATICACIDVEN in the last 168 hours.  Recent Results (from the past 240 hour(s))  Anaerobic culture     Status: None (Preliminary result)   Collection Time: 07/22/20  5:03 PM   Specimen: PATH GI biopsy; Body Fluid  Result Value Ref Range Status   Specimen Description FLUID  Final   Special Requests PANCREACTIC CYST FLUID SPEC A  Final   Gram Stain   Final    FEW WBC PRESENT, PREDOMINANTLY MONONUCLEAR FEW GRAM NEGATIVE RODS RARE GRAM POSITIVE COCCI IN CLUSTERS RARE GRAM POSITIVE RODS Performed at [facility], [place] [address] [place], [place] [address]    Culture   Final    FEW CANDIDA ALBICANS MODERATE LACTOBACILLUS SPECIES Standardized susceptibility testing for this organism is not available. FEW SERRATIA MARCESCENS    Report Status PENDING  Incomplete  C Difficile Quick Screen (NO PCR Reflex)     Status: Abnormal   Collection Time: 07/25/20  2:37 PM   Specimen: Stool  Result Value Ref Range Status   C Diff antigen POSITIVE (A) NEGATIVE Final   C Diff toxin NEGATIVE NEGATIVE Final   C Diff interpretation   Final    Results are indeterminate. Please contact your campus specific ID physician or [facility].    Comment: Performed at [facility], [place] [address], [place], [place] [address]      Radiology Studies: No results found.  Scheduled Meds: . estradiol  1 mg Oral Daily  . feeding supplement  237 mL Oral BID BM  . fluconazole  200 mg Oral Daily  . hydrocortisone   Rectal TID  . lipase/protease/amylase  72,000 Units Oral TID WC  . mometasone-formoterol  2 puff Inhalation BID  . multivitamin with minerals  1 tablet Oral Daily  . potassium chloride   20 mEq Oral Daily  . sodium chloride flush  3 mL Intravenous Q12H  . vancomycin  125 mg Oral QID   Continuous Infusions: . ampicillin-sulbactam (UNASYN) IV 3 g (07/29/20 0736)  . lactated ringers 50 mL/hr at 07/29/20 0636     LOS: 19 days   Time spent: 76 minutes  [facility]  If 7PM-7AM, please contact night-coverage [email or web address] 07/29/2020, 7:55 AM

## 2020-07-29 NOTE — Transfer of Care (Signed)
Immediate Anesthesia Transfer of Care Note  Patient: Tricia Brown  Procedure(s) Performed: FLEXIBLE SIGMOIDOSCOPY (N/A ) BIOPSY  Patient Location: Endoscopy Unit  Anesthesia Type:MAC  Level of Consciousness: awake, drowsy and patient cooperative  Airway & Oxygen Therapy: Patient Spontanous Breathing and Patient connected to face mask oxygen  Post-op Assessment: Report given to RN, Post -op Vital signs reviewed and stable and Patient moving all extremities X 4  Post vital signs: Reviewed and stable  Last Vitals:  Vitals Value Taken Time  BP 111/59 07/29/20 1319  Temp    Pulse 88 07/29/20 1319  Resp 12 07/29/20 1319  SpO2 100 % 07/29/20 1319  Vitals shown include unvalidated device data.  Last Pain:  Vitals:   07/29/20 1213  TempSrc: Oral  PainSc: 6       Patients Stated Pain Goal: 4 (91/50/56 9794)  Complications: No complications documented.

## 2020-07-30 DIAGNOSIS — K859 Acute pancreatitis without necrosis or infection, unspecified: Secondary | ICD-10-CM | POA: Diagnosis not present

## 2020-07-30 LAB — ANAEROBIC CULTURE

## 2020-07-30 LAB — SURGICAL PATHOLOGY

## 2020-07-30 MED ORDER — LOPERAMIDE HCL 2 MG PO CAPS: 2.0000 mg | ORAL_CAPSULE | Freq: Three times a day (TID) | ORAL | Status: DC | PRN

## 2020-07-30 NOTE — Progress Notes (Addendum)
PROGRESS NOTE    Tricia Brown  TDV:[id number] DOB: Oct 03, 1975 DOA: 07/10/2020 PCP: Lorelee Market, MD   Brief Narrative:  44 y.o.femalewith medical history significant ofendometriosis status post diagnostic laparoscopy and hysterectomy, IBS-D, GERD, status post cholecystectomy presents with abdominal pain x4 days, epigastric and left upper quadrant with associated nausea, vomiting and diarrhea, pain rated 4 out of 10 and when flares of up to 8 out of 10 with certain movement and following meals, not improved by Tums and daily PPI at home.  Drinks alcohol about sixpack on occasion on weekends. In the ED vitals stable labs with hypokalemia 3.0 alk phos 230, leukocytosis 14 K lipase 151 troponin x2 - UA normal CT abdomen showed pancreatitis with fluid collection consistent with pseudocyst reported as "stranding noted around the pancreatic body and tail compatible with acute pancreatitis. Adjacent fluid collections, the largest 3.8 cm most compatible with acute fluid collections and developing pseudocysts. Secondary reactive inflammation of the adjacent gastric wall. Small to moderate free fluid in the pelvis" Patient was given IV fluids pain medication Zofran and admission was requested.  GI was consulted.  Had MRCP no evidence of choledocholithiasis. Patient is  being treated for acute pancreatitis, possibly passed CBD stone; she underwent repeat CTA 10/25-that showed multiple pseudocyst pressing on the stomach-largest being 8.7 cm with significant mass-effect on the gastric body.  Patient continues to have profound diarrhea despite treatment for C. difficile positive antigen although toxin was negative, patient is on pancreatic enzymes as well as bile acid sequestrants in the setting of profound pancreatitis and pseudocyst.  Flex sig on 07/29/2020 was unremarkable for any overt colitis, pseudomembranous or otherwise.  Formed stool noted in the vault with minimal bleeding hemorrhoid otherwise  unremarkable.  GI planning to start Lomotil, advance diet to low-fat diet - if patient is still in the hospital on Friday November 12 plan is for Dr. Rush Landmark will remove her Axios cyst gastrostomy drain; if she is able to discharge prior to this date will follow up in the outpatient setting in 2 to 3 weeks.  Assessment & Plan:  Acute pancreatitis pseudocyst formation: -Status post cyst gastrostomy on 07/22/2020-plastic double-pigtail stents placed. -Continue aggressive IV hydration and pain management w/ IV Dilaudid oxy, bentyl -Advancing diet per GI recommendations. -Hold Protonix -Pancreatic cyst fluid growing few Candida, moderate lactobacillus and Serratia marcescens -continue Zosyn -Continue on Creon -Repeat CT 11/5 shows ongoing improvement of pseudocyst  Esophageal candidiasis Concurrent yeast infection - Pathology remarkable for Candida, continue Diflucan per GI, discontinue nystatin - Yeast infection covered by above  Cdiff Ag positive; toxin negative, likely indicative of previous infection - Defer to GI; vancomycin discontinued in the setting of negative colonoscopy  Fluid overload with leg edema and shortness of breath:  - Resolved after IV Lasix administration - Continue IV hydration and monitor signs of fluid overload  Loose stool/diarrhea, profound:  - Likely multifactorial given above, improving minimally with Creon and Imodium  - Continue IV fluids if unable to keep up with p.o. intake needs.   - Patient indicates marked amounts of ongoing diarrhea,, this is not seen in nursing documentation.  Will have more accurate I's and O's given patient does not appear dehydrated or have hypokalemia which would be expected if she were having upwards of 20 loose stool bowel movements per day as she reports.  Hypokalemia:  - Resolved  Hypoalbuminemia in the setting of acute pancreatitis, improving Lab Results  Component Value Date   LABPROT 13.3 07/22/2020   Anemia of  chronic disease, stable Thrombocytosis, ongoing -No signs of active bleeding.  Monitor H&H closely. Lab Results  Component Value Date   WBC 9.0 07/29/2020   HGB 10.3 (L) 07/29/2020   HCT 32.6 (L) 07/29/2020   MCV 95.6 07/29/2020   PLT 722 (H) 96/12/5407   Mild metabolic acidosis/dehydration due to #1.  Resolved. Chronic back pain, controlled, continue pain management Depression:  Stable  DVT prophylaxis: SCD, no chemical anticoagulation as per GI recommendations.  Code Status: Full code Family Communication:  None present at bedside.  Plan of care discussed with patient in length and he verbalized understanding and agreed with it. Disposition Plan: Home after the procedure  Consultants:   GI  Procedures:   As above  Antimicrobials:   Zosyn  Status is: Inpatient  Dispo: The patient is from: Home              Anticipated d/c is to: Home              Anticipated d/c date is: 07/28/2020              Patient currently not medically stable for the discharge  Subjective: No acute issues or events overnight, patient continues to complain of profound diarrhea but these appear to be very small episodes of somewhat formed stool now rather than truly liquid diarrhea.  Denies constipation headache fevers or chills.  Objective: Vitals:   07/29/20 1352 07/29/20 2054 07/30/20 0424 07/30/20 0819  BP: 133/75  123/82   Pulse: 90  77   Resp: 16  17   Temp: 97.7 F (36.5 C)  97.9 F (36.6 C)   TempSrc:   Oral   SpO2: 100% 100% 100% 100%  Weight:      Height:        Intake/Output Summary (Last 24 hours) at 07/30/2020 0821 Last data filed at 07/30/2020 0544 Gross per 24 hour  Intake 1170 ml  Output --  Net 1170 ml   Filed Weights   07/17/20 1500 07/22/20 1506  Weight: 63.5 kg 63.5 kg   General exam: Appears calm and comfortable, on room air, communicating well Respiratory system: Clear to auscultation. Respiratory effort normal. Cardiovascular system: S1 & S2 heard, RRR.  No JVD, murmurs, rubs, gallops or clicks. No pedal edema. Gastrointestinal system: Epigastric tenderness mild but ongoing, no guarding, no rigidity, bowel sounds positive. Central nervous system: Alert and oriented. No focal neurological deficits. Extremities: Symmetric 5 x 5 power. Skin: No rashes, lesions or ulcers. Psychiatry: Judgement and insight appear normal. Mood & affect appropriate.  Data Reviewed: I have personally reviewed following labs and imaging studies  CBC: Recent Labs  Lab 07/24/20 0059 07/25/20 0037 07/27/20 0909 07/29/20 0543  WBC 12.1* 9.4 8.4 9.0  NEUTROABS 8.1* 5.5  --   --   HGB 9.3* 9.1* 10.1* 10.3*  HCT 29.2* 29.6* 32.0* 32.6*  MCV 98.0 97.4 96.7 95.6  PLT 710* 645* 763* 811*   Basic Metabolic Panel: Recent Labs  Lab 07/24/20 0059 07/25/20 0037 07/27/20 0909 07/29/20 0543  NA 138 138 137 139  K 4.1 4.1 4.0 3.9  CL 103 104 100 104  CO2 $Re'26 26 24 24  'XAb$ GLUCOSE 109* 108* 87 80  BUN 8 <5* <5* <5*  CREATININE 0.90 0.85 0.86 0.79  CALCIUM 8.7* 8.9 9.7 9.6   GFR: Estimated Creatinine Clearance: 87.3 mL/min (by C-G formula based on SCr of 0.79 mg/dL). Liver Function Tests: Recent Labs  Lab 07/24/20 0059 07/25/20 0037 07/27/20 0909 07/29/20  0543  AST 14* 21 43* 30  ALT 10 16 34 37  ALKPHOS 137* 154* 260* 243*  BILITOT 0.1* <0.1* 0.4 0.4  PROT 6.1* 6.0* 6.9 6.8  ALBUMIN 2.4* 2.4* 2.8* 2.8*   No results for input(s): LIPASE, AMYLASE in the last 168 hours. No results for input(s): AMMONIA in the last 168 hours. Coagulation Profile: No results for input(s): INR, PROTIME in the last 168 hours. Cardiac Enzymes: No results for input(s): CKTOTAL, CKMB, CKMBINDEX, TROPONINI in the last 168 hours. BNP (last 3 results) No results for input(s): PROBNP in the last 8760 hours. HbA1C: No results for input(s): HGBA1C in the last 72 hours. CBG: Recent Labs  Lab 07/23/20 1129  GLUCAP 97   Lipid Profile: No results for input(s): CHOL, HDL, LDLCALC,  TRIG, CHOLHDL, LDLDIRECT in the last 72 hours. Thyroid Function Tests: No results for input(s): TSH, T4TOTAL, FREET4, T3FREE, THYROIDAB in the last 72 hours. Anemia Panel: No results for input(s): VITAMINB12, FOLATE, FERRITIN, TIBC, IRON, RETICCTPCT in the last 72 hours. Sepsis Labs: No results for input(s): PROCALCITON, LATICACIDVEN in the last 168 hours.  Recent Results (from the past 240 hour(s))  Anaerobic culture     Status: None (Preliminary result)   Collection Time: 07/22/20  5:03 PM   Specimen: PATH GI biopsy; Body Fluid  Result Value Ref Range Status   Specimen Description FLUID  Final   Special Requests PANCREACTIC CYST FLUID SPEC A  Final   Gram Stain   Final    FEW WBC PRESENT, PREDOMINANTLY MONONUCLEAR FEW GRAM NEGATIVE RODS RARE GRAM POSITIVE COCCI IN CLUSTERS RARE GRAM POSITIVE RODS    Culture   Final    FEW CANDIDA ALBICANS MODERATE LACTOBACILLUS SPECIES Standardized susceptibility testing for this organism is not available. FEW SERRATIA MARCESCENS SUSCEPTIBILITIES TO FOLLOW Performed at [facility], [place] [address], [place], [place] [address]    Report Status PENDING  Incomplete  C Difficile Quick Screen (NO PCR Reflex)     Status: Abnormal   Collection Time: 07/25/20  2:37 PM   Specimen: Stool  Result Value Ref Range Status   C Diff antigen POSITIVE (A) NEGATIVE Final   C Diff toxin NEGATIVE NEGATIVE Final   C Diff interpretation   Final    Results are indeterminate. Please contact your campus specific ID physician or [place].    Comment: Performed at [facility], [place] [address], [place], [place] [address]      Radiology Studies: No results found.  Scheduled Meds: . diphenoxylate-atropine  5 mL Oral TID  . estradiol  1 mg Oral Daily  . feeding supplement  237 mL Oral BID BM  . fluconazole  200 mg Oral Daily  . hydrocortisone   Rectal TID  . lipase/protease/amylase  72,000 Units Oral TID WC  . mometasone-formoterol  2 puff Inhalation BID    . multivitamin with minerals  1 tablet Oral Daily  . potassium chloride  20 mEq Oral Daily  . sodium chloride flush  3 mL Intravenous Q12H  . vancomycin  125 mg Oral QID   Continuous Infusions: . lactated ringers 50 mL/hr at 07/30/20 0544  . piperacillin-tazobactam (ZOSYN)  IV 3.375 g (07/30/20 0315)     LOS: 20 days   Time spent: 50 minutes  [facility]  If 7PM-7AM, please contact night-coverage [email or web address] 07/30/2020, 8:21 AM

## 2020-07-30 NOTE — Progress Notes (Signed)
[facility] Progress Note  Tricia Brown 44 y.o. 1975-12-06   Subjective: Reports 10 loose stools overnight (down from 20) and most of the episodes are small "squirts" of loose stools. Abdominal pain ongoing.  Objective: Vital signs: Vitals:   07/30/20 0424 07/30/20 0819  BP: 123/82   Pulse: 77   Resp: 17   Temp: 97.9 F (36.6 C)   SpO2: 100% 100%    Physical Exam: Gen: alert, no acute distress, thin  HEENT: anicteric sclera CV: RRR Chest: CTA B Abd: mild distention, diffuse tenderness with guarding, soft, +BS Ext: no edema  Lab Results: Recent Labs    07/29/20 0543  NA 139  K 3.9  CL 104  CO2 24  GLUCOSE 80  BUN <5*  CREATININE 0.79  CALCIUM 9.6   Recent Labs    07/29/20 0543  AST 30  ALT 37  ALKPHOS 243*  BILITOT 0.4  PROT 6.8  ALBUMIN 2.8*   Recent Labs    07/29/20 0543  WBC 9.0  HGB 10.3*  HCT 32.6*  MCV 95.6  PLT 722*      Assessment/Plan: Complicated pancreatitis with pseudocysts drainage and slow improvement clinically. Flex sig yesterday negative for pseudomembranous colitis and semi-solid stool seen in the examined colon. Biopsies pending. D/C PO Vanco. Started Lomotil prn. Full liquid diet. Advance as tolerated to low fat diet. Supportive care. If patient is still in the hospital on Friday November 12, then Dr. Meridee Score will remove her Axios cyst gastrostomy drain. If she is discharged prior to Friday, then he will do it as an outpt in 2-3 weeks. Dr. Viona Gilmore will f/u tomorrow.   [name] 07/30/2020, 9:48 AM  Questions please call [phone] ID: Tricia Brown, female   DOB: [birth year], 44 y.o.   MRN: [id number]

## 2020-07-31 ENCOUNTER — Encounter (HOSPITAL_COMMUNITY): Payer: Self-pay | Admitting: Gastroenterology

## 2020-07-31 DIAGNOSIS — R197 Diarrhea, unspecified: Secondary | ICD-10-CM

## 2020-07-31 DIAGNOSIS — K863 Pseudocyst of pancreas: Secondary | ICD-10-CM | POA: Diagnosis not present

## 2020-07-31 DIAGNOSIS — K859 Acute pancreatitis without necrosis or infection, unspecified: Secondary | ICD-10-CM | POA: Diagnosis not present

## 2020-07-31 LAB — CBC
HCT: 31.5 % — ABNORMAL LOW (ref 36.0–46.0)
Hemoglobin: 9.9 g/dL — ABNORMAL LOW (ref 12.0–15.0)
MCH: 30.5 pg (ref 26.0–34.0)
MCHC: 31.4 g/dL (ref 30.0–36.0)
MCV: 96.9 fL (ref 80.0–100.0)
Platelets: 698 10*3/uL — ABNORMAL HIGH (ref 150–400)
RBC: 3.25 MIL/uL — ABNORMAL LOW (ref 3.87–5.11)
RDW: 13.6 % (ref 11.5–15.5)
WBC: 10.2 10*3/uL (ref 4.0–10.5)
nRBC: 0 % (ref 0.0–0.2)

## 2020-07-31 LAB — COMPREHENSIVE METABOLIC PANEL
ALT: 23 U/L (ref 0–44)
AST: 15 U/L (ref 15–41)
Albumin: 2.6 g/dL — ABNORMAL LOW (ref 3.5–5.0)
Alkaline Phosphatase: 197 U/L — ABNORMAL HIGH (ref 38–126)
Anion gap: 9 (ref 5–15)
BUN: <5 mg/dL — ABNORMAL LOW (ref 6–20)
CO2: 25 mmol/L (ref 22–32)
Calcium: 9.3 mg/dL (ref 8.9–10.3)
Chloride: 107 mmol/L (ref 98–111)
Creatinine, Ser: 0.83 mg/dL (ref 0.44–1.00)
GFR, Estimated: >60 mL/min (ref >60)
Glucose, Bld: 118 mg/dL — ABNORMAL HIGH (ref 70–99)
Potassium: 4.7 mmol/L (ref 3.5–5.1)
Sodium: 141 mmol/L (ref 135–145)
Total Bilirubin: 0.3 mg/dL (ref 0.3–1.2)
Total Protein: 5.8 g/dL — ABNORMAL LOW (ref 6.5–8.1)

## 2020-07-31 LAB — C DIFFICILE QUICK SCREEN W PCR REFLEX
C Diff antigen: NEGATIVE
C Diff interpretation: NOT DETECTED
C Diff toxin: NEGATIVE

## 2020-07-31 NOTE — Progress Notes (Signed)
- PROGRESS NOTE    Tricia Brown   YWV:[id number]  DOB: 1976/06/29  DOA: 07/10/2020 PCP: [name], MD   Brief Narrative:  Tricia Brown 44 y.o.femalewith medical history significant ofendometriosis status post diagnostic laparoscopy and hysterectomy, IBS-D, GERD, status post cholecystectomy presents with abdominal pain x4 days, epigastric and left upper quadrant with associated nausea, vomiting and diarrhea, pain rated 4 out of 10 and when flares of up to 8 out of 10 with certain movement and following meals, not improved by Tums and daily PPI at home.   In ED > CT scan showing stranding noted around the pancreatic body and tail compatible with acute pancreatitis. Adjacent fluid collections, the largest 3.8 cm most compatible with acute fluid collections and developing pseudocysts  MRCP did not show choledocholithiasis.   10/31 CT > well-organized cystic structure within the lesser sac between the pancreas, stomach and spleen measures 9.9 x 6.8 by 11.6 Cm  11/1 EUS > Diffuse, yellow plaques were found in the entire esophagus.Marland KitchenMarland KitchenExtrinsic impression noted on the posterior wall of the stomach....multiple cysts were identified in the pancreatic body and pancreatic tail...Marland KitchenAXIOS Cystogastrostomy was performed. Plastic double-pigtail stents placed to decrease risk of  necroma closure of stent.                            She began to have diarrhea during the course of her illness and was checked for C diff- antigen was + and toxin was negative. Creon started.   11/8 - Flex sig done with biopsies which showed benign colon mucosa- Lomotil started.     Subjective: She states she is having ongoing diarrhea- unable to explain how much (sometimes a lot and sometimes a little) having on and off upper abdominal pain.     Assessment & Plan:   Principal Problem:   Acute Pancreatitis with infected pseudocyst - 11/1 culture report> SERRATIA MARCESCENS , few candida and moderate  lactobacillus - on soft diet with Creon and ensures - on Lactated ringers 50 cc/hr- oral intake of fluid is poor - cont Oxycodone - cont Zosyn - repeat CT 11/5 showed improvement in pseudocyst  Active Problems: Diarrhea - initially started on Vancomycin which was d/c'd after colonoscopy  - c diff ordered again today by GI - I have asked RN to collect stool and urine separately so that we know exactly how much stools he is having - cont Imodium and Lomotil - has been advance to solids- this may help  Candida esophagitis - cont Diflucan 200 mg daily  Hypokalemia - resolved   Normocytic anemia - follow- Hb ~ 9-10  Thrombocytosis - likely related to infection     Candida esophagitis Time spent in minutes: 35 DVT prophylaxis: SCDs Code Status: Full  Family Communication:  Disposition Plan:  Status is: Inpatient  Remains inpatient appropriate because:Ongoing diagnostic testing needed not appropriate for outpatient work up   Dispo: The patient is from: Home              Anticipated d/c is to: Home              Anticipated d/c date is: > 3 days              Patient currently is not medically stable to d/c.      Consultants:   GI Procedures:   As above Antimicrobials:  Anti-infectives (From admission, onward)   Start     Dose/Rate Route Frequency Ordered  Stop   07/29/20 1200  piperacillin-tazobactam (ZOSYN) IVPB 3.375 g        3.375 g 12.5 mL/hr over 240 Minutes Intravenous Every 8 hours 07/29/20 1101     07/28/20 1400  Ampicillin-Sulbactam (UNASYN) 3 g in sodium chloride 0.9 % 100 mL IVPB  Status:  Discontinued        3 g 200 mL/hr over 30 Minutes Intravenous Every 6 hours 07/28/20 1052 07/29/20 1101   07/26/20 1030  vancomycin (VANCOCIN) 50 mg/mL oral solution 125 mg  Status:  Discontinued        125 mg Oral 4 times daily 07/26/20 0930 07/30/20 0935   07/25/20 1030  fluconazole (DIFLUCAN) tablet 200 mg        200 mg Oral Daily 07/25/20 0936 08/08/20 0959    07/22/20 2030  piperacillin-tazobactam (ZOSYN) IVPB 3.375 g  Status:  Discontinued        3.375 g 12.5 mL/hr over 240 Minutes Intravenous Every 8 hours 07/22/20 1944 07/28/20 1050   07/22/20 1400  piperacillin-tazobactam (ZOSYN) IVPB 3.375 g        3.375 g 100 mL/hr over 30 Minutes Intravenous 60 min pre-op 07/21/20 0951 07/22/20 1500       Objective: Vitals:   07/30/20 2049 07/31/20 0523 07/31/20 0855 07/31/20 1404  BP: 131/90 134/69  110/72  Pulse: 81 83  78  Resp: 17 18  16   Temp: 98.1 F (36.7 C) 97.6 F (36.4 C)  98 F (36.7 C)  TempSrc: Oral Oral  Oral  SpO2: 100% 100% 99% 100%  Weight:      Height:        Intake/Output Summary (Last 24 hours) at 07/31/2020 1619 Last data filed at 07/31/2020 0300 Gross per 24 hour  Intake 150 ml  Output 2700 ml  Net -2550 ml   Filed Weights   07/17/20 1500 07/22/20 1506  Weight: 63.5 kg 63.5 kg    Examination: General exam: Appears comfortable  HEENT: PERRLA, oral mucosa moist, no sclera icterus or thrush Respiratory system: Clear to auscultation. Respiratory effort normal. Cardiovascular system: S1 & S2 heard, RRR.   Gastrointestinal system: Abdomen soft,  Tender across upper abdomen, nondistended. Normal bowel sounds. Central nervous system: Alert and oriented. No focal neurological deficits. Extremities: No cyanosis, clubbing or edema Skin: No rashes or ulcers Psychiatry:  Mood & affect appropriate.     Data Reviewed: I have personally reviewed following labs and imaging studies  CBC: Recent Labs  Lab 07/25/20 0037 07/27/20 0909 07/29/20 0543 07/31/20 0452  WBC 9.4 8.4 9.0 10.2  NEUTROABS 5.5  --   --   --   HGB 9.1* 10.1* 10.3* 9.9*  HCT 29.6* 32.0* 32.6* 31.5*  MCV 97.4 96.7 95.6 96.9  PLT 645* 763* 722* 698*   Basic Metabolic Panel: Recent Labs  Lab 07/25/20 0037 07/27/20 0909 07/29/20 0543 07/31/20 0452  NA 138 137 139 141  K 4.1 4.0 3.9 4.7  CL 104 100 104 107  CO2 26 24 24 25   GLUCOSE 108*  87 80 118*  BUN <5* <5* <5* <5*  CREATININE 0.85 0.86 0.79 0.83  CALCIUM 8.9 9.7 9.6 9.3   GFR: Estimated Creatinine Clearance: 84.1 mL/min (by C-G formula based on SCr of 0.83 mg/dL). Liver Function Tests: Recent Labs  Lab 07/25/20 0037 07/27/20 0909 07/29/20 0543 07/31/20 0452  AST 21 43* 30 15  ALT 16 34 37 23  ALKPHOS 154* 260* 243* 197*  BILITOT <0.1* 0.4 0.4 0.3  PROT 6.0*  6.9 6.8 5.8*  ALBUMIN 2.4* 2.8* 2.8* 2.6*   No results for input(s): LIPASE, AMYLASE in the last 168 hours. No results for input(s): AMMONIA in the last 168 hours. Coagulation Profile: No results for input(s): INR, PROTIME in the last 168 hours. Cardiac Enzymes: No results for input(s): CKTOTAL, CKMB, CKMBINDEX, TROPONINI in the last 168 hours. BNP (last 3 results) No results for input(s): PROBNP in the last 8760 hours. HbA1C: No results for input(s): HGBA1C in the last 72 hours. CBG: No results for input(s): GLUCAP in the last 168 hours. Lipid Profile: No results for input(s): CHOL, HDL, LDLCALC, TRIG, CHOLHDL, LDLDIRECT in the last 72 hours. Thyroid Function Tests: No results for input(s): TSH, T4TOTAL, FREET4, T3FREE, THYROIDAB in the last 72 hours. Anemia Panel: No results for input(s): VITAMINB12, FOLATE, FERRITIN, TIBC, IRON, RETICCTPCT in the last 72 hours. Urine analysis:    Component Value Date/Time   COLORURINE STRAW (A) 07/10/2020 1444   APPEARANCEUR CLEAR 07/10/2020 1444   APPEARANCEUR CLEAR 03/23/2013 1621   LABSPEC 1.003 (L) 07/10/2020 1444   LABSPEC 1.021 03/23/2013 1621   PHURINE 7.0 07/10/2020 1444   GLUCOSEU NEGATIVE 07/10/2020 1444   GLUCOSEU see comment 03/23/2013 1621   HGBUR NEGATIVE 07/10/2020 1444   BILIRUBINUR NEGATIVE 07/10/2020 1444   BILIRUBINUR see comment 03/23/2013 1621   KETONESUR NEGATIVE 07/10/2020 1444   PROTEINUR NEGATIVE 07/10/2020 1444   UROBILINOGEN 1.0 09/29/2014 2116   NITRITE NEGATIVE 07/10/2020 1444   LEUKOCYTESUR NEGATIVE 07/10/2020 1444    LEUKOCYTESUR see comment 03/23/2013 1621   Sepsis Labs: @LABRCNTIP (procalcitonin:4,lacticidven:4) ) Recent Results (from the past 240 hour(s))  Anaerobic culture     Status: None   Collection Time: 07/22/20  5:03 PM   Specimen: PATH GI biopsy; Body Fluid  Result Value Ref Range Status   Specimen Description FLUID  Final   Special Requests PANCREACTIC CYST FLUID SPEC A  Final   Gram Stain   Final    FEW WBC PRESENT, PREDOMINANTLY MONONUCLEAR FEW GRAM NEGATIVE RODS RARE GRAM POSITIVE COCCI IN CLUSTERS RARE GRAM POSITIVE RODS Performed at [facility], [address] [place], [place] [address]    Culture   Final    FEW CANDIDA ALBICANS MODERATE LACTOBACILLUS SPECIES Standardized susceptibility testing for this organism is not available. FEW SERRATIA MARCESCENS    Report Status 07/30/2020 FINAL  Final   Organism ID, Bacteria SERRATIA MARCESCENS  Final      Susceptibility   Serratia marcescens - MIC*    CEFAZOLIN >=64 RESISTANT Resistant     CEFEPIME <=0.12 SENSITIVE Sensitive     CEFTAZIDIME <=1 SENSITIVE Sensitive     CEFTRIAXONE <=0.25 SENSITIVE Sensitive     CIPROFLOXACIN <=0.25 SENSITIVE Sensitive     GENTAMICIN <=1 SENSITIVE Sensitive     TRIMETH/SULFA <=20 SENSITIVE Sensitive     * FEW SERRATIA MARCESCENS  C Difficile Quick Screen (NO PCR Reflex)     Status: Abnormal   Collection Time: 07/25/20  2:37 PM   Specimen: Stool  Result Value Ref Range Status   C Diff antigen POSITIVE (A) NEGATIVE Final   C Diff toxin NEGATIVE NEGATIVE Final   C Diff interpretation   Final    Results are indeterminate. Please contact your campus specific ID physician or CMO.    Comment: Performed at [facility], [address] [place], [place] [address]  C Difficile Quick Screen w PCR reflex     Status: None   Collection Time: 07/31/20  2:13 PM  Result Value Ref Range Status   C Diff antigen NEGATIVE NEGATIVE Final   C Diff toxin NEGATIVE NEGATIVE Final   C Diff  interpretation No C. difficile detected.  Final    Comment: Performed at [facility], [address] [place], [place] [address]         Radiology Studies: No results found.    Scheduled Meds: . diphenoxylate-atropine  5 mL Oral TID  . estradiol  1 mg Oral Daily  . feeding supplement  237 mL Oral BID BM  . fluconazole  200 mg Oral Daily  . hydrocortisone   Rectal TID  . mometasone-formoterol  2 puff Inhalation BID  . multivitamin with minerals  1 tablet Oral Daily  . sodium chloride flush  3 mL Intravenous Q12H   Continuous Infusions: . lactated ringers 50 mL/hr at 07/30/20 0544  . piperacillin-tazobactam (ZOSYN)  IV 3.375 g (07/31/20 1119)     LOS: 21 days      Calvert Cantor, MD [facility] Pager: [email or web address] 07/31/2020, 4:19 PM

## 2020-07-31 NOTE — Progress Notes (Signed)
Nutrition Follow-up  DOCUMENTATION CODES:   Not applicable  INTERVENTION:    -Continue Ensure Enlive po BID, each supplement provides 350 kcal and 20 grams of protein -Continue MVI with minerals daily  NUTRITION DIAGNOSIS:   Inadequate oral intake related to altered GI function as evidenced by NPO status.  Progressing; advanced to soft diet on 07/31/20  GOAL:   Patient will meet greater than or equal to 90% of their needs  Progressing   MONITOR:   PO intake, Supplement acceptance, Diet advancement, Labs, Weight trends, Skin, I & O's  REASON FOR ASSESSMENT:   Malnutrition Screening Tool    ASSESSMENT:   Tricia Brown is a 44 y.o. female with medical history significant of endometriosis status post diagnostic laparoscopy and hysterectomy, IBS-D, GERD, status post cholecystectomy who presents with abdominal pain.  10/29- post-pyloric feeding tube placed, bridled 10/31- s/p cortrak removal, TF d/c 11/1- s/p EGD- esophageal plaques found (suspicious for candidiasis biopsies), erythematous mucosa in stomach, extrinsic compression in the posterior wall of the stomach, stomach biopsied 11/8- s/p flex sig- revealed hemorrhoids, semi-solid stool in rectum, sigmoid colon, and descending colon (bopsied) 11/10- advanced to soft diet   Reviewed I/O's: -3.1 L x 24 hours and +13.1 L since 07/17/20  UOP: 4.1 L x 24 hours  Pt unavailable at time of visit.   Per MD notes, pt feeling better, but still with nausea and diarrhea. Plan for removal of axios stent and placement of plastic stent on 08/02/20.   Intake has improved. Noted meal completion 50-75%. Pt is consuming Ensure Enlive supplements.   Medications reviewed and include lomotil.   Labs reviewed.   Diet Order:   Diet Order            DIET SOFT Room service appropriate? Yes; Fluid consistency: Thin  Diet effective now                 EDUCATION NEEDS:   Education needs have been addressed  Skin:  Skin  Assessment: Reviewed RN Assessment  Last BM:  07/31/20  Height:   Ht Readings from Last 1 Encounters:  07/17/20 5\' 7"  (1.702 m)    Weight:   Wt Readings from Last 1 Encounters:  07/22/20 63.5 kg    Ideal Body Weight:  61.4 kg  BMI:  Body mass index is 21.93 kg/m.  Estimated Nutritional Needs:   Kcal:  1900-2100  Protein:  95-110 grams  Fluid:  > 1.9 L    13/01/21, RD, LDN, CDCES Registered Dietitian II Certified Diabetes Care and Education Specialist Please refer to [facility] for RD and/or RD on-call/weekend/after hours pager

## 2020-07-31 NOTE — Progress Notes (Signed)
[facility] Progress Note  Tricia Brown 44 y.o. 1976/08/05  CC: Complicated pancreatitis, diarrhea   Subjective: She is feeling better from pancreatitis standpoint. Occasional nausea. Continues to have diarrhea with up to 10 bowel movements yesterday. Abdominal pain is improving. Denies any blood in the stool or black stool.  ROS : Afebrile, negative for chest pain.   Objective: Vital signs in last 24 hours: Vitals:   07/30/20 2049 07/31/20 0523  BP: 131/90 134/69  Pulse: 81 83  Resp: 17 18  Temp: 98.1 F (36.7 C) 97.6 F (36.4 C)  SpO2: 100% 100%    Physical Exam:  General:  Alert, cooperative, no distress, appears stated age  Head:  Normocephalic, without obvious abnormality, atraumatic  Eyes:  , EOM's intact,   Lungs:    No visible respiratory distress  Heart:  Regular rate and rhythm, S1, S2 normal  Abdomen:    Soft, mild epigastric tenderness to palpation, nondistended, bowel sounds present. No peritoneal signs  Extremities: Extremities normal, atraumatic, no  edema       Lab Results: Recent Labs    07/29/20 0543 07/31/20 0452  NA 139 141  K 3.9 4.7  CL 104 107  CO2 24 25  GLUCOSE 80 118*  BUN <5* <5*  CREATININE 0.79 0.83  CALCIUM 9.6 9.3   Recent Labs    07/29/20 0543 07/31/20 0452  AST 30 15  ALT 37 23  ALKPHOS 243* 197*  BILITOT 0.4 0.3  PROT 6.8 5.8*  ALBUMIN 2.8* 2.6*   Recent Labs    07/29/20 0543 07/31/20 0452  WBC 9.0 10.2  HGB 10.3* 9.9*  HCT 32.6* 31.5*  MCV 95.6 96.9  PLT 722* 698*   No results for input(s): LABPROT, INR in the last 72 hours.    Assessment/Plan: Complicated pancreatitis with pseudocyst formation. Status post cystogastrostomy and Axios stent placement on November 1st, 2021. Repeat CT scan on November 5th 2021 showed significant improvement in size of the large pancreatic pseudocyst with nearly complete decompression. -Acute on chronic diarrhea. C. difficile toxin positive. Flex sig showed solid  stool and normal appearing mucosa. Biopsies negative.  Recommendations ------------------------ -Advance diet to soft. Continue loperamide and Imodium. -Check for C. difficile PCR because of ongoing diarrhea with more than 10 bowel movements per day -Tentative plan for removal of Axios stent on Friday and placement of plastic stent with Dr. Meridee Score -Continue supportive care. Taper down narcotics. GI will follow   Kathi Der MD, FACP 07/31/2020, 8:55 AM  Contact #  [phone]

## 2020-08-01 DIAGNOSIS — K859 Acute pancreatitis without necrosis or infection, unspecified: Secondary | ICD-10-CM | POA: Diagnosis not present

## 2020-08-01 DIAGNOSIS — E876 Hypokalemia: Secondary | ICD-10-CM | POA: Diagnosis not present

## 2020-08-01 NOTE — Progress Notes (Signed)
[facility] Progress Note  Tricia Brown 44 y.o. 1976/03/01  CC: Complicated pancreatitis, diarrhea   Subjective: Patient seen and examined at bedside.  Family at bedside.  Feeling much better.  Diarrhea improving.  Continues to have abdominal pain and nausea.  Tolerating soft diet.  ROS : Afebrile, negative for chest pain.   Objective: Vital signs in last 24 hours: Vitals:   07/31/20 2257 08/01/20 0504  BP: 130/81 119/85  Pulse: 88 80  Resp: 17 16  Temp: 97.6 F (36.4 C) 97.6 F (36.4 C)  SpO2: 99% 100%    Physical Exam:  General:  Alert, cooperative, no distress, appears stated age  Head:  Normocephalic, without obvious abnormality, atraumatic  Eyes:  , EOM's intact,   Lungs:    No visible respiratory distress  Heart:  Regular rate and rhythm, S1, S2 normal  Abdomen:    Soft, mild epigastric tenderness to palpation, nondistended, bowel sounds present. No peritoneal signs  Extremities: Extremities normal, atraumatic, no  edema       Lab Results: Recent Labs    07/31/20 0452  NA 141  K 4.7  CL 107  CO2 25  GLUCOSE 118*  BUN <5*  CREATININE 0.83  CALCIUM 9.3   Recent Labs    07/31/20 0452  AST 15  ALT 23  ALKPHOS 197*  BILITOT 0.3  PROT 5.8*  ALBUMIN 2.6*   Recent Labs    07/31/20 0452  WBC 10.2  HGB 9.9*  HCT 31.5*  MCV 96.9  PLT 698*   No results for input(s): LABPROT, INR in the last 72 hours.    Assessment/Plan: Complicated pancreatitis with pseudocyst formation. Status post cystogastrostomy and Axios stent placement on November 1st, 2021. Repeat CT scan on November 5th 2021 showed significant improvement in size of the large pancreatic pseudocyst with nearly complete decompression. -Acute on chronic diarrhea. C. difficile toxin positive. Flex sig showed solid stool and normal appearing mucosa. Biopsies negative.  C. difficile PCR negative.  Diarrhea improving.  Recommendations ------------------------ -Continue soft diet  today.  N.p.o. past midnight. -Continue Imodium and Lomotil. -Continue Creon - Plan for removal of Axios stent  and placement of plastic stent with Dr. Meridee Score tomorrow -Continue supportive care. Taper down narcotics. GI will follow -Management plan discussed with family at bedside.   Kathi Der MD, FACP 08/01/2020, 11:06 AM  Contact #  [phone]

## 2020-08-01 NOTE — Progress Notes (Signed)
PROGRESS NOTE    Tricia Brown   OEU:[id number]  DOB: [birth year]  DOA: 07/10/2020 PCP: [name], MD   Brief Narrative:  Tricia Brown 44 y.o.femalewith medical history significant ofendometriosis status post diagnostic laparoscopy and hysterectomy, IBS-D, GERD, status post cholecystectomy presents with abdominal pain x4 days, epigastric and left upper quadrant with associated nausea, vomiting and diarrhea, pain rated 4 out of 10 and when flares of up to 8 out of 10 with certain movement and following meals, not improved by Tums and daily PPI at home.   In ED > CT scan showing stranding noted around the pancreatic body and tail compatible with acute pancreatitis. Adjacent fluid collections, the largest 3.8 cm most compatible with acute fluid collections and developing pseudocysts  MRCP did not show choledocholithiasis.   10/31 CT > well-organized cystic structure within the lesser sac between the pancreas, stomach and spleen measures 9.9 x 6.8 by 11.6 Cm  11/1 EUS > Diffuse, yellow plaques were found in the entire esophagus.Marland KitchenMarland KitchenExtrinsic impression noted on the posterior wall of the stomach....multiple cysts were identified in the pancreatic body and pancreatic tail...Marland KitchenAXIOS Cystogastrostomy was performed. Plastic double-pigtail stents placed to decrease risk of  necroma closure of stent.                            She began to have diarrhea during the course of her illness and was checked for C diff- antigen was + and toxin was negative. Creon started.   11/8 - Flex sig done with biopsies which showed benign colon mucosa- Lomotil started.     Subjective: No new complaints. Still has diarrhea. Ate a little eggs and bacon this AM but did not eat much else.     Assessment & Plan:   Principal Problem:   Acute Pancreatitis with infected pseudocyst - 11/1 culture report> SERRATIA MARCESCENS , few candida and moderate lactobacillus - on soft diet with Creon and ensures -  on Lactated ringers 50 cc/hr- oral intake of fluid is poor - cont Oxycodone - cont Zosyn - repeat CT 11/5 showed improvement in pseudocyst - plan for removal of Axios stent  and placement of plastic stent tomorrow  Active Problems: Diarrhea - initially started on Vancomycin which was d/c'd after colonoscopy  - c diff ordered again today by GI - I have asked RN to collect stool and urine separately so that we know exactly how much stools he is having - cont Imodium and Lomotil - has been advance to solids  - stool output 350 cc yesterday  Candida esophagitis - cont Diflucan 200 mg daily  Hypokalemia - resolved   Normocytic anemia - follow- Hb ~ 9-10  Thrombocytosis - likely related to infection     Candida esophagitis Time spent in minutes: 35 DVT prophylaxis: Place and maintain sequential compression device Start: 07/31/20 1638SCDs Code Status: Full  Family Communication:  Disposition Plan:  Status is: Inpatient  Remains inpatient appropriate because:Ongoing diagnostic testing needed not appropriate for outpatient work up   Dispo: The patient is from: Home              Anticipated d/c is to: Home              Anticipated d/c date is: > 3 days              Patient currently is not medically stable to d/c.      Consultants:   GI Procedures:  As above Antimicrobials:  Anti-infectives (From admission, onward)   Start     Dose/Rate Route Frequency Ordered Stop   07/29/20 1200  piperacillin-tazobactam (ZOSYN) IVPB 3.375 g        3.375 g 12.5 mL/hr over 240 Minutes Intravenous Every 8 hours 07/29/20 1101     07/28/20 1400  Ampicillin-Sulbactam (UNASYN) 3 g in sodium chloride 0.9 % 100 mL IVPB  Status:  Discontinued        3 g 200 mL/hr over 30 Minutes Intravenous Every 6 hours 07/28/20 1052 07/29/20 1101   07/26/20 1030  vancomycin (VANCOCIN) 50 mg/mL oral solution 125 mg  Status:  Discontinued        125 mg Oral 4 times daily 07/26/20 0930 07/30/20 0935    07/25/20 1030  fluconazole (DIFLUCAN) tablet 200 mg        200 mg Oral Daily 07/25/20 0936 08/08/20 0959   07/22/20 2030  piperacillin-tazobactam (ZOSYN) IVPB 3.375 g  Status:  Discontinued        3.375 g 12.5 mL/hr over 240 Minutes Intravenous Every 8 hours 07/22/20 1944 07/28/20 1050   07/22/20 1400  piperacillin-tazobactam (ZOSYN) IVPB 3.375 g        3.375 g 100 mL/hr over 30 Minutes Intravenous 60 min pre-op 07/21/20 0951 07/22/20 1500       Objective: Vitals:   07/31/20 2201 07/31/20 2257 08/01/20 0504 08/01/20 1346  BP:  130/81 119/85 125/73  Pulse:  88 80 83  Resp:  17 16 17   Temp:  97.6 F (36.4 C) 97.6 F (36.4 C) 97.7 F (36.5 C)  TempSrc:  Oral Oral Oral  SpO2: 99% 99% 100% 100%  Weight:      Height:        Intake/Output Summary (Last 24 hours) at 08/01/2020 1600 Last data filed at 08/01/2020 1554 Gross per 24 hour  Intake 1400 ml  Output 6750 ml  Net -5350 ml   Filed Weights   07/17/20 1500 07/22/20 1506  Weight: 63.5 kg 63.5 kg    Examination: General exam: Appears comfortable  HEENT: PERRLA, oral mucosa moist, no sclera icterus or thrush Respiratory system: Clear to auscultation. Respiratory effort normal. Cardiovascular system: S1 & S2 heard,  No murmurs  Gastrointestinal system: Abdomen soft,  Tender across upper abdomen, nondistended. Normal bowel sounds   Central nervous system: Alert and oriented. No focal neurological deficits. Extremities: No cyanosis, clubbing or edema Skin: No rashes or ulcers Psychiatry:  Mood & affect appropriate.      Data Reviewed: I have personally reviewed following labs and imaging studies  CBC: Recent Labs  Lab 07/27/20 0909 07/29/20 0543 07/31/20 0452  WBC 8.4 9.0 10.2  HGB 10.1* 10.3* 9.9*  HCT 32.0* 32.6* 31.5*  MCV 96.7 95.6 96.9  PLT 763* 722* 698*   Basic Metabolic Panel: Recent Labs  Lab 07/27/20 0909 07/29/20 0543 07/31/20 0452  NA 137 139 141  K 4.0 3.9 4.7  CL 100 104 107  CO2 24 24  25   GLUCOSE 87 80 118*  BUN <5* <5* <5*  CREATININE 0.86 0.79 0.83  CALCIUM 9.7 9.6 9.3   GFR: Estimated Creatinine Clearance: 84.1 mL/min (by C-G formula based on SCr of 0.83 mg/dL). Liver Function Tests: Recent Labs  Lab 07/27/20 0909 07/29/20 0543 07/31/20 0452  AST 43* 30 15  ALT 34 37 23  ALKPHOS 260* 243* 197*  BILITOT 0.4 0.4 0.3  PROT 6.9 6.8 5.8*  ALBUMIN 2.8* 2.8* 2.6*   No results for  input(s): LIPASE, AMYLASE in the last 168 hours. No results for input(s): AMMONIA in the last 168 hours. Coagulation Profile: No results for input(s): INR, PROTIME in the last 168 hours. Cardiac Enzymes: No results for input(s): CKTOTAL, CKMB, CKMBINDEX, TROPONINI in the last 168 hours. BNP (last 3 results) No results for input(s): PROBNP in the last 8760 hours. HbA1C: No results for input(s): HGBA1C in the last 72 hours. CBG: No results for input(s): GLUCAP in the last 168 hours. Lipid Profile: No results for input(s): CHOL, HDL, LDLCALC, TRIG, CHOLHDL, LDLDIRECT in the last 72 hours. Thyroid Function Tests: No results for input(s): TSH, T4TOTAL, FREET4, T3FREE, THYROIDAB in the last 72 hours. Anemia Panel: No results for input(s): VITAMINB12, FOLATE, FERRITIN, TIBC, IRON, RETICCTPCT in the last 72 hours. Urine analysis:    Component Value Date/Time   COLORURINE STRAW (A) 07/10/2020 1444   APPEARANCEUR CLEAR 07/10/2020 1444   APPEARANCEUR CLEAR 03/23/2013 1621   LABSPEC 1.003 (L) 07/10/2020 1444   LABSPEC 1.021 03/23/2013 1621   PHURINE 7.0 07/10/2020 1444   GLUCOSEU NEGATIVE 07/10/2020 1444   GLUCOSEU see comment 03/23/2013 1621   HGBUR NEGATIVE 07/10/2020 1444   BILIRUBINUR NEGATIVE 07/10/2020 1444   BILIRUBINUR see comment 03/23/2013 1621   KETONESUR NEGATIVE 07/10/2020 1444   PROTEINUR NEGATIVE 07/10/2020 1444   UROBILINOGEN 1.0 09/29/2014 2116   NITRITE NEGATIVE 07/10/2020 1444   LEUKOCYTESUR NEGATIVE 07/10/2020 1444   LEUKOCYTESUR see comment 03/23/2013 1621    Sepsis Labs: @LABRCNTIP (procalcitonin:4,lacticidven:4) ) Recent Results (from the past 240 hour(s))  Anaerobic culture     Status: None   Collection Time: 07/22/20  5:03 PM   Specimen: PATH GI biopsy; Body Fluid  Result Value Ref Range Status   Specimen Description FLUID  Final   Special Requests PANCREACTIC CYST FLUID SPEC A  Final   Gram Stain   Final    FEW WBC PRESENT, PREDOMINANTLY MONONUCLEAR FEW GRAM NEGATIVE RODS RARE GRAM POSITIVE COCCI IN CLUSTERS RARE GRAM POSITIVE RODS Performed at [facility], [address] [place], [place] [address]    Culture   Final    FEW CANDIDA ALBICANS MODERATE LACTOBACILLUS SPECIES Standardized susceptibility testing for this organism is not available. FEW SERRATIA MARCESCENS    Report Status 07/30/2020 FINAL  Final   Organism ID, Bacteria SERRATIA MARCESCENS  Final      Susceptibility   Serratia marcescens - MIC*    CEFAZOLIN >=64 RESISTANT Resistant     CEFEPIME <=0.12 SENSITIVE Sensitive     CEFTAZIDIME <=1 SENSITIVE Sensitive     CEFTRIAXONE <=0.25 SENSITIVE Sensitive     CIPROFLOXACIN <=0.25 SENSITIVE Sensitive     GENTAMICIN <=1 SENSITIVE Sensitive     TRIMETH/SULFA <=20 SENSITIVE Sensitive     * FEW SERRATIA MARCESCENS  C Difficile Quick Screen (NO PCR Reflex)     Status: Abnormal   Collection Time: 07/25/20  2:37 PM   Specimen: Stool  Result Value Ref Range Status   C Diff antigen POSITIVE (A) NEGATIVE Final   C Diff toxin NEGATIVE NEGATIVE Final   C Diff interpretation   Final    Results are indeterminate. Please contact your campus specific ID physician or CMO.    Comment: Performed at [facility], [address] [place], [place] [address]  C Difficile Quick Screen w PCR reflex     Status: None   Collection Time: 07/31/20  2:13 PM  Result Value Ref Range Status   C Diff antigen NEGATIVE NEGATIVE Final  C Diff toxin NEGATIVE NEGATIVE Final   C Diff interpretation No C. difficile detected.  Final     Comment: Performed at [facility], [address], [place], [place] [address]         Radiology Studies: No results found.    Scheduled Meds: . diphenoxylate-atropine  5 mL Oral TID  . estradiol  1 mg Oral Daily  . feeding supplement  237 mL Oral BID BM  . fluconazole  200 mg Oral Daily  . hydrocortisone   Rectal TID  . mometasone-formoterol  2 puff Inhalation BID  . multivitamin with minerals  1 tablet Oral Daily  . sodium chloride flush  3 mL Intravenous Q12H   Continuous Infusions: . lactated ringers 50 mL/hr at 07/31/20 1949  . piperacillin-tazobactam (ZOSYN)  IV 3.375 g (08/01/20 1208)     LOS: 22 days      Calvert Cantor, MD [facility] Pager: [email or web address] 08/01/2020, 4:00 PM

## 2020-08-01 NOTE — Plan of Care (Signed)

## 2020-08-02 ENCOUNTER — Encounter (HOSPITAL_COMMUNITY): Payer: Self-pay | Admitting: Internal Medicine

## 2020-08-02 ENCOUNTER — Inpatient Hospital Stay (HOSPITAL_COMMUNITY): Payer: Medicaid Other | Admitting: Certified Registered Nurse Anesthetist

## 2020-08-02 ENCOUNTER — Encounter (HOSPITAL_COMMUNITY)
Admission: EM | Disposition: A | Discharge status: Home / Self Care | Payer: Self-pay | Source: Home / Self Care | Attending: Internal Medicine

## 2020-08-02 DIAGNOSIS — E876 Hypokalemia: Secondary | ICD-10-CM | POA: Diagnosis not present

## 2020-08-02 DIAGNOSIS — K449 Diaphragmatic hernia without obstruction or gangrene: Secondary | ICD-10-CM

## 2020-08-02 DIAGNOSIS — Z978 Presence of other specified devices: Secondary | ICD-10-CM

## 2020-08-02 DIAGNOSIS — K859 Acute pancreatitis without necrosis or infection, unspecified: Secondary | ICD-10-CM | POA: Diagnosis not present

## 2020-08-02 DIAGNOSIS — Z4659 Encounter for fitting and adjustment of other gastrointestinal appliance and device: Secondary | ICD-10-CM

## 2020-08-02 HISTORY — PX: STENT REMOVAL: SHX6421

## 2020-08-02 HISTORY — PX: EUS: SHX5427

## 2020-08-02 HISTORY — PX: ESOPHAGOGASTRODUODENOSCOPY: SHX5428

## 2020-08-02 LAB — COMPREHENSIVE METABOLIC PANEL
ALT: 19 U/L (ref 0–44)
AST: 15 U/L (ref 15–41)
Albumin: 2.6 g/dL — ABNORMAL LOW (ref 3.5–5.0)
Alkaline Phosphatase: 157 U/L — ABNORMAL HIGH (ref 38–126)
Anion gap: 9 (ref 5–15)
BUN: <5 mg/dL — ABNORMAL LOW (ref 6–20)
CO2: 24 mmol/L (ref 22–32)
Calcium: 9.2 mg/dL (ref 8.9–10.3)
Chloride: 107 mmol/L (ref 98–111)
Creatinine, Ser: 1.01 mg/dL — ABNORMAL HIGH (ref 0.44–1.00)
GFR, Estimated: >60 mL/min (ref >60)
Glucose, Bld: 97 mg/dL (ref 70–99)
Potassium: 4.2 mmol/L (ref 3.5–5.1)
Sodium: 140 mmol/L (ref 135–145)
Total Bilirubin: 0.5 mg/dL (ref 0.3–1.2)
Total Protein: 5.9 g/dL — ABNORMAL LOW (ref 6.5–8.1)

## 2020-08-02 LAB — CBC
HCT: 30.4 % — ABNORMAL LOW (ref 36.0–46.0)
Hemoglobin: 9.7 g/dL — ABNORMAL LOW (ref 12.0–15.0)
MCH: 30.3 pg (ref 26.0–34.0)
MCHC: 31.9 g/dL (ref 30.0–36.0)
MCV: 95 fL (ref 80.0–100.0)
Platelets: 551 10*3/uL — ABNORMAL HIGH (ref 150–400)
RBC: 3.2 MIL/uL — ABNORMAL LOW (ref 3.87–5.11)
RDW: 14 % (ref 11.5–15.5)
WBC: 11.9 10*3/uL — ABNORMAL HIGH (ref 4.0–10.5)
nRBC: 0 % (ref 0.0–0.2)

## 2020-08-02 SURGERY — EGD (ESOPHAGOGASTRODUODENOSCOPY)
Anesthesia: Monitor Anesthesia Care

## 2020-08-02 MED ORDER — AMOXICILLIN-POT CLAVULANATE 875-125 MG PO TABS
1.0000 | ORAL_TABLET | Freq: Two times a day (BID) | ORAL | Status: AC
  Administered 2020-08-02 – 2020-08-06 (×10): 1 via ORAL
  Filled 2020-08-02 (×11): qty 1

## 2020-08-02 MED ORDER — PANTOPRAZOLE SODIUM 40 MG PO TBEC
40.0000 mg | DELAYED_RELEASE_TABLET | Freq: Two times a day (BID) | ORAL | Status: DC
  Administered 2020-08-02 – 2020-08-07 (×10): 40 mg via ORAL
  Filled 2020-08-02 (×11): qty 1

## 2020-08-02 MED ORDER — PROPOFOL 10 MG/ML IV BOLUS
INTRAVENOUS | Status: DC | PRN
  Administered 2020-08-02 (×2): 20 mg via INTRAVENOUS
  Administered 2020-08-02 (×2): 30 mg via INTRAVENOUS
  Administered 2020-08-02: 20 mg via INTRAVENOUS
  Administered 2020-08-02: 30 mg via INTRAVENOUS

## 2020-08-02 MED ORDER — PROPOFOL 500 MG/50ML IV EMUL
INTRAVENOUS | Status: DC | PRN
  Administered 2020-08-02: 125 ug/kg/min via INTRAVENOUS

## 2020-08-02 MED ORDER — LIDOCAINE 2% (20 MG/ML) 5 ML SYRINGE
INTRAMUSCULAR | Status: DC | PRN
  Administered 2020-08-02: 60 mg via INTRAVENOUS

## 2020-08-02 NOTE — Plan of Care (Signed)
  Problem: Education: Goal: Knowledge of General Education information will improve Description: Including pain rating scale, medication(s)/side effects and non-pharmacologic comfort measures 08/02/2020 0828 by Sandford Craze, RN Outcome: Progressing 08/01/2020 1859 by Sandford Craze, RN Outcome: Progressing   Problem: Health Behavior/Discharge Planning: Goal: Ability to manage health-related needs will improve 08/02/2020 0828 by Sandford Craze, RN Outcome: Progressing 08/01/2020 1859 by Sandford Craze, RN Outcome: Progressing   Problem: Clinical Measurements: Goal: Ability to maintain clinical measurements within normal limits will improve 08/02/2020 0828 by Sandford Craze, RN Outcome: Progressing 08/01/2020 1859 by Sandford Craze, RN Outcome: Progressing Goal: Will remain free from infection 08/02/2020 0828 by Sandford Craze, RN Outcome: Progressing 08/01/2020 1859 by Sandford Craze, RN Outcome: Progressing Goal: Diagnostic test results will improve 08/02/2020 0828 by Sandford Craze, RN Outcome: Progressing 08/01/2020 1859 by Sandford Craze, RN Outcome: Progressing Goal: Respiratory complications will improve 08/02/2020 0828 by Sandford Craze, RN Outcome: Progressing 08/01/2020 1859 by Sandford Craze, RN Outcome: Progressing Goal: Cardiovascular complication will be avoided 08/02/2020 0828 by Sandford Craze, RN Outcome: Progressing 08/01/2020 1859 by Sandford Craze, RN Outcome: Progressing   Problem: Activity: Goal: Risk for activity intolerance will decrease 08/02/2020 0828 by Sandford Craze, RN Outcome: Progressing 08/01/2020 1859 by Sandford Craze, RN Outcome: Progressing   Problem: Nutrition: Goal: Adequate nutrition will be maintained 08/02/2020 0828 by Sandford Craze, RN Outcome: Progressing 08/01/2020 1859 by Sandford Craze, RN Outcome: Progressing   Problem: Coping: Goal: Level of anxiety will decrease 08/02/2020 0828 by Sandford Craze, RN Outcome: Progressing 08/01/2020 1859 by Sandford Craze, RN Outcome: Progressing   Problem: Elimination: Goal: Will not experience complications related to bowel motility 08/02/2020 0828 by Sandford Craze, RN Outcome: Progressing 08/01/2020 1859 by Sandford Craze, RN Outcome: Progressing Goal: Will not experience complications related to urinary retention 08/02/2020 0828 by Sandford Craze, RN Outcome: Progressing 08/01/2020 1859 by Sandford Craze, RN Outcome: Progressing   Problem: Pain Managment: Goal: General experience of comfort will improve 08/02/2020 0828 by Sandford Craze, RN Outcome: Progressing 08/01/2020 1859 by Sandford Craze, RN Outcome: Progressing   Problem: Safety: Goal: Ability to remain free from injury will improve 08/02/2020 0828 by Sandford Craze, RN Outcome: Progressing 08/01/2020 1859 by Sandford Craze, RN Outcome: Progressing   Problem: Skin Integrity: Goal: Risk for impaired skin integrity will decrease 08/02/2020 0828 by Sandford Craze, RN Outcome: Progressing 08/01/2020 1859 by Sandford Craze, RN Outcome: Progressing   Problem: Education: Goal: Required Educational Video(s) 08/02/2020 0828 by Sandford Craze, RN Outcome: Progressing 08/01/2020 1859 by Sandford Craze, RN Outcome: Progressing   Problem: Clinical Measurements: Goal: Postoperative complications will be avoided or minimized 08/02/2020 0828 by Sandford Craze, RN Outcome: Progressing 08/01/2020 1859 by Sandford Craze, RN Outcome: Progressing   Problem: Skin Integrity: Goal: Demonstration of wound healing without infection will improve 08/02/2020 0828 by Sandford Craze, RN Outcome: Progressing 08/01/2020 1859 by Sandford Craze, RN Outcome: Progressing

## 2020-08-02 NOTE — Anesthesia Preprocedure Evaluation (Signed)
Anesthesia Evaluation  Patient identified by MRN, date of birth, ID band Patient awake    Reviewed: Allergy & Precautions, NPO status , Patient's Chart, lab work & pertinent test results  Airway Mallampati: II  TM Distance: >3 FB Neck ROM: Full    Dental  (+) Edentulous Upper, Edentulous Lower, Upper Dentures, Lower Dentures   Pulmonary neg pulmonary ROS, Current Smoker,    Pulmonary exam normal breath sounds clear to auscultation       Cardiovascular negative cardio ROS Normal cardiovascular exam Rhythm:Regular Rate:Normal     Neuro/Psych  Headaches, PSYCHIATRIC DISORDERS Depression    GI/Hepatic negative GI ROS, (+)     substance abuse  ,   Endo/Other  negative endocrine ROS  Renal/GU negative Renal ROS  negative genitourinary   Musculoskeletal negative musculoskeletal ROS (+) narcotic dependent  Abdominal   Peds  Hematology  (+) Blood dyscrasia (Hgb 10.3), anemia ,   Anesthesia Other Findings   Reproductive/Obstetrics                             Anesthesia Physical  Anesthesia Plan  ASA: III  Anesthesia Plan: MAC   Post-op Pain Management:    Induction: Intravenous  PONV Risk Score and Plan: 1 and Propofol infusion and Treatment may vary due to age or medical condition  Airway Management Planned: Natural Airway  Additional Equipment:   Intra-op Plan:   Post-operative Plan:   Informed Consent: I have reviewed the patients History and Physical, chart, labs and discussed the procedure including the risks, benefits and alternatives for the proposed anesthesia with the patient or authorized representative who has indicated his/her understanding and acceptance.     Dental advisory given  Plan Discussed with: CRNA  Anesthesia Plan Comments:         Anesthesia Quick Evaluation

## 2020-08-02 NOTE — Progress Notes (Signed)
PROGRESS NOTE    Tricia Brown   ALP:[id number]  DOB: 04-28-1976  DOA: 07/10/2020 PCP: [name], MD   Brief Narrative:  Tricia Brown 44 y.o.femalewith medical history significant ofendometriosis status post diagnostic laparoscopy and hysterectomy, IBS-D, GERD, status post cholecystectomy presents with abdominal pain x4 days, epigastric and left upper quadrant with associated nausea, vomiting and diarrhea, pain rated 4 out of 10 and when flares of up to 8 out of 10 with certain movement and following meals, not improved by Tums and daily PPI at home.   In ED > CT scan showing stranding noted around the pancreatic body and tail compatible with acute pancreatitis. Adjacent fluid collections, the largest 3.8 cm most compatible with acute fluid collections and developing pseudocysts  MRCP did not show choledocholithiasis.   10/31 CT > well-organized cystic structure within the lesser sac between the pancreas, stomach and spleen measures 9.9 x 6.8 by 11.6 Cm  11/1 EUS > Diffuse, yellow plaques were found in the entire esophagus.Marland KitchenMarland KitchenExtrinsic impression noted on the posterior wall of the stomach....multiple cysts were identified in the pancreatic body and pancreatic tail...Marland KitchenAXIOS Cystogastrostomy was performed. Plastic double-pigtail stents placed to decrease risk of  necroma closure of stent.                            She began to have diarrhea during the course of her illness and was checked for C diff- antigen was + and toxin was negative. Creon started.   11/8 - Flex sig done with biopsies which showed benign colon mucosa- Lomotil started.     Subjective: No new complaints. She has not had diarrhea since yesterday afternoon. Prior to that she was still having watery stools.     Assessment & Plan:   Principal Problem:   Acute Pancreatitis with infected pseudocyst - 11/1 culture report> SERRATIA MARCESCENS , few candida and moderate lactobacillus - on soft diet with  Creon and ensures - on Lactated ringers 50 cc/hr- oral intake of fluid is poor - cont Oxycodone - cont Zosyn - repeat CT 11/5 showed improvement in pseudocyst -  Underwent EUS and removal of Axios stent  and placement of plastic stent- no further necrosis noted- recommended 5 more days of antibiotics  Active Problems: Diarrhea - initially started on Vancomycin which was d/c'd after colonoscopy  - c diff ordered again today by GI - I have asked RN to collect stool and urine separately so that we know exactly how much stools he is having - cont Imodium and Lomotil - has been advance to solids  - watery stool has slowed down  Candida esophagitis - cont Diflucan 200 mg daily  Hypokalemia - resolved   Normocytic anemia - follow- Hb ~ 9-10  Thrombocytosis - likely related to infection - improving     Candida esophagitis Time spent in minutes: 35 DVT prophylaxis: Place and maintain sequential compression device Start: 07/31/20 1638SCDs Code Status: Full  Family Communication:  Disposition Plan:  Status is: Inpatient  Remains inpatient appropriate because:Ongoing diagnostic testing needed not appropriate for outpatient work up   Dispo: The patient is from: Home              Anticipated d/c is to: Home              Anticipated d/c date is: > 3 days              Patient currently is not medically stable to  d/c.      Consultants:   GI Procedures:   As above Antimicrobials:  Anti-infectives (From admission, onward)   Start     Dose/Rate Route Frequency Ordered Stop   08/02/20 1200  amoxicillin-clavulanate (AUGMENTIN) 875-125 MG per tablet 1 tablet        1 tablet Oral Every 12 hours 08/02/20 1145     07/29/20 1200  piperacillin-tazobactam (ZOSYN) IVPB 3.375 g  Status:  Discontinued        3.375 g 12.5 mL/hr over 240 Minutes Intravenous Every 8 hours 07/29/20 1101 08/02/20 1145   07/28/20 1400  Ampicillin-Sulbactam (UNASYN) 3 g in sodium chloride 0.9 % 100 mL IVPB   Status:  Discontinued        3 g 200 mL/hr over 30 Minutes Intravenous Every 6 hours 07/28/20 1052 07/29/20 1101   07/26/20 1030  vancomycin (VANCOCIN) 50 mg/mL oral solution 125 mg  Status:  Discontinued        125 mg Oral 4 times daily 07/26/20 0930 07/30/20 0935   07/25/20 1030  fluconazole (DIFLUCAN) tablet 200 mg        200 mg Oral Daily 07/25/20 0936 08/08/20 0959   07/22/20 2030  piperacillin-tazobactam (ZOSYN) IVPB 3.375 g  Status:  Discontinued        3.375 g 12.5 mL/hr over 240 Minutes Intravenous Every 8 hours 07/22/20 1944 07/28/20 1050   07/22/20 1400  piperacillin-tazobactam (ZOSYN) IVPB 3.375 g        3.375 g 100 mL/hr over 30 Minutes Intravenous 60 min pre-op 07/21/20 0951 07/22/20 1500       Objective: Vitals:   08/02/20 1130 08/02/20 1140 08/02/20 1150 08/02/20 1200  BP: 122/70 130/74 124/78 122/76  Pulse: 76 78 69 68  Resp: 15 17 13 13   Temp:      TempSrc:      SpO2: 100% 100% 100% 99%  Weight:      Height:        Intake/Output Summary (Last 24 hours) at 08/02/2020 1639 Last data filed at 08/02/2020 1620 Gross per 24 hour  Intake 2302.35 ml  Output 1400 ml  Net 902.35 ml   Filed Weights   07/22/20 1506 08/01/20 1606 08/02/20 0500  Weight: 63.5 kg 60.1 kg 60.7 kg    Examination: General exam: Appears comfortable  HEENT: PERRLA, oral mucosa moist, no sclera icterus or thrush Respiratory system: Clear to auscultation. Respiratory effort normal. Cardiovascular system: S1 & S2 heard,  No murmurs  Gastrointestinal system: Abdomen soft, tender across upper abdomen, moderately distended. Normal bowel sounds   Central nervous system: Alert and oriented. No focal neurological deficits. Extremities: No cyanosis, clubbing or edema Skin: No rashes or ulcers Psychiatry:  Mood & affect appropriate.    Data Reviewed: I have personally reviewed following labs and imaging studies  CBC: Recent Labs  Lab 07/27/20 0909 07/29/20 0543 07/31/20 0452  08/02/20 0554  WBC 8.4 9.0 10.2 11.9*  HGB 10.1* 10.3* 9.9* 9.7*  HCT 32.0* 32.6* 31.5* 30.4*  MCV 96.7 95.6 96.9 95.0  PLT 763* 722* 698* 551*   Basic Metabolic Panel: Recent Labs  Lab 07/27/20 0909 07/29/20 0543 07/31/20 0452 08/02/20 0554  NA 137 139 141 140  K 4.0 3.9 4.7 4.2  CL 100 104 107 107  CO2 24 24 25 24   GLUCOSE 87 80 118* 97  BUN <5* <5* <5* <5*  CREATININE 0.86 0.79 0.83 1.01*  CALCIUM 9.7 9.6 9.3 9.2   GFR: Estimated Creatinine Clearance: 68.1 mL/min (  A) (by C-G formula based on SCr of 1.01 mg/dL (H)). Liver Function Tests: Recent Labs  Lab 07/27/20 0909 07/29/20 0543 07/31/20 0452 08/02/20 0554  AST 43* 30 15 15   ALT 34 37 23 19  ALKPHOS 260* 243* 197* 157*  BILITOT 0.4 0.4 0.3 0.5  PROT 6.9 6.8 5.8* 5.9*  ALBUMIN 2.8* 2.8* 2.6* 2.6*   No results for input(s): LIPASE, AMYLASE in the last 168 hours. No results for input(s): AMMONIA in the last 168 hours. Coagulation Profile: No results for input(s): INR, PROTIME in the last 168 hours. Cardiac Enzymes: No results for input(s): CKTOTAL, CKMB, CKMBINDEX, TROPONINI in the last 168 hours. BNP (last 3 results) No results for input(s): PROBNP in the last 8760 hours. HbA1C: No results for input(s): HGBA1C in the last 72 hours. CBG: No results for input(s): GLUCAP in the last 168 hours. Lipid Profile: No results for input(s): CHOL, HDL, LDLCALC, TRIG, CHOLHDL, LDLDIRECT in the last 72 hours. Thyroid Function Tests: No results for input(s): TSH, T4TOTAL, FREET4, T3FREE, THYROIDAB in the last 72 hours. Anemia Panel: No results for input(s): VITAMINB12, FOLATE, FERRITIN, TIBC, IRON, RETICCTPCT in the last 72 hours. Urine analysis:    Component Value Date/Time   COLORURINE STRAW (A) 07/10/2020 1444   APPEARANCEUR CLEAR 07/10/2020 1444   APPEARANCEUR CLEAR 03/23/2013 1621   LABSPEC 1.003 (L) 07/10/2020 1444   LABSPEC 1.021 03/23/2013 1621   PHURINE 7.0 07/10/2020 1444   GLUCOSEU NEGATIVE 07/10/2020  1444   GLUCOSEU see comment 03/23/2013 1621   HGBUR NEGATIVE 07/10/2020 1444   BILIRUBINUR NEGATIVE 07/10/2020 1444   BILIRUBINUR see comment 03/23/2013 1621   KETONESUR NEGATIVE 07/10/2020 1444   PROTEINUR NEGATIVE 07/10/2020 1444   UROBILINOGEN 1.0 09/29/2014 2116   NITRITE NEGATIVE 07/10/2020 1444   LEUKOCYTESUR NEGATIVE 07/10/2020 1444   LEUKOCYTESUR see comment 03/23/2013 1621   Sepsis Labs: @LABRCNTIP (procalcitonin:4,lacticidven:4) ) Recent Results (from the past 240 hour(s))  C Difficile Quick Screen (NO PCR Reflex)     Status: Abnormal   Collection Time: 07/25/20  2:37 PM   Specimen: Stool  Result Value Ref Range Status   C Diff antigen POSITIVE (A) NEGATIVE Final   C Diff toxin NEGATIVE NEGATIVE Final   C Diff interpretation   Final    Results are indeterminate. Please contact your campus specific ID physician or CMO.    Comment: Performed at [facility], [address], [place], [address] [place]  C Difficile Quick Screen w PCR reflex     Status: None   Collection Time: 07/31/20  2:13 PM  Result Value Ref Range Status   C Diff antigen NEGATIVE NEGATIVE Final   C Diff toxin NEGATIVE NEGATIVE Final   C Diff interpretation No C. difficile detected.  Final    Comment: Performed at [facility], [address] [place], [address] [place]         Radiology Studies: No results found.    Scheduled Meds: . amoxicillin-clavulanate  1 tablet Oral Q12H  . diphenoxylate-atropine  5 mL Oral TID  . estradiol  1 mg Oral Daily  . feeding supplement  237 mL Oral BID BM  . fluconazole  200 mg Oral Daily  . hydrocortisone   Rectal TID  . mometasone-formoterol  2 puff Inhalation BID  . multivitamin with minerals  1 tablet Oral Daily  . pantoprazole  40 mg Oral BID AC  . sodium chloride flush  3 mL Intravenous Q12H   Continuous Infusions: . lactated ringers  50 mL/hr at 08/02/20 1241     LOS: 23 days      Calvert CantorSaima Dajohn Ellender, MD [facility] Pager: [email or web address] 08/02/2020, 4:39 PM

## 2020-08-02 NOTE — Anesthesia Postprocedure Evaluation (Signed)
Anesthesia Post Note  Patient: Tricia Brown  Procedure(s) Performed: ESOPHAGOGASTRODUODENOSCOPY (EGD) (N/A ) UPPER ENDOSCOPIC ULTRASOUND (EUS) LINEAR STENT REMOVAL     Patient location during evaluation: Endoscopy Anesthesia Type: MAC Level of consciousness: awake and alert Pain management: pain level controlled Vital Signs Assessment: post-procedure vital signs reviewed and stable Respiratory status: spontaneous breathing, nonlabored ventilation and respiratory function stable Cardiovascular status: blood pressure returned to baseline and stable Postop Assessment: no apparent nausea or vomiting Anesthetic complications: no   No complications documented.  Last Vitals:  Vitals:   08/02/20 1011 08/02/20 1120  BP: 136/79 120/67  Pulse: 84 85  Resp: 18 (!) 21  Temp: (!) 36.4 C 36.6 C  SpO2: 100% 100%    Last Pain:  Vitals:   08/02/20 1120  TempSrc: Oral  PainSc: 0-No pain                 Lynda Rainwater

## 2020-08-02 NOTE — Transfer of Care (Signed)
Immediate Anesthesia Transfer of Care Note  Patient: Tricia Brown  Procedure(s) Performed: ESOPHAGOGASTRODUODENOSCOPY (EGD) (N/A ) UPPER ENDOSCOPIC ULTRASOUND (EUS) LINEAR STENT REMOVAL  Patient Location: Endoscopy Unit  Anesthesia Type:MAC  Level of Consciousness: awake, alert  and oriented  Airway & Oxygen Therapy: Patient Spontanous Breathing and Patient connected to nasal cannula oxygen  Post-op Assessment: Report given to RN and Post -op Vital signs reviewed and stable  Post vital signs: Reviewed and stable  Last Vitals:  Vitals Value Taken Time  BP 114/72   Temp    Pulse 79 08/02/20 1120  Resp 16 08/02/20 1120  SpO2 99 % 08/02/20 1120  Vitals shown include unvalidated device data.  Last Pain:  Vitals:   08/02/20 1011  TempSrc: Temporal  PainSc: 0-No pain      Patients Stated Pain Goal: 5 (53/20/23 3435)  Complications: No complications documented.

## 2020-08-02 NOTE — Progress Notes (Signed)
Two attempts for new IV 20g, unsuccessful, placed order for IV team.

## 2020-08-02 NOTE — Op Note (Signed)
[facility] Patient Name: Tricia Brown Procedure Date : 08/02/2020 MRN: [id number] Attending MD: Justice Britain , MD Date of Birth: 04/16/1976 CSN: [id number] Age: 44 Admit Type: Inpatient Procedure:                Upper EUS Indications:              Rule out choledocholithiasis, Acute pancreatitis Providers:                Justice Britain, MD, Baird Cancer, RN, Cletis Athens, Technician Referring MD:             Clarene Essex, MD, Arta Silence, MD, [facility] Medicines:                Monitored Anesthesia Care Complications:            No immediate complications. Estimated Blood Loss:     Estimated blood loss was minimal. Procedure:                Pre-Anesthesia Assessment:                           - Prior to the procedure, a History and Physical                            was performed, and patient medications and                            allergies were reviewed. The patient's tolerance of                            previous anesthesia was also reviewed. The risks                            and benefits of the procedure and the sedation                            options and risks were discussed with the patient.                            All questions were answered, and informed consent                            was obtained. Prior Anticoagulants: The patient has                            taken no previous anticoagulant or antiplatelet                            agents. ASA Grade Assessment: III - A patient with  severe systemic disease. After reviewing the risks                            and benefits, the patient was deemed in                            satisfactory condition to undergo the procedure.                           After obtaining informed consent, the endoscope was                            passed under direct vision. Throughout the                             procedure, the patient's blood pressure, pulse, and                            oxygen saturations were monitored continuously. The                            [id number] ([id number]) Olympus therapeutic                            gastroscope was introduced through the mouth, and                            advanced to the second part of duodenum. The                            [id number] ([id number]) Linear was introduced through                            the mouth, and advanced to the duodenum for                            ultrasound examination from the stomach and                            duodenum. The upper EUS was accomplished without                            difficulty. The patient tolerated the procedure. Scope In: Scope Out: Findings:      ENDOSCOPIC FINDING: :      No gross lesions were noted in the entire esophagus.      A 2 cm hiatal hernia was present.      A previously placed AXIOS cystgastrostomy stent with 2 double pigtail       stents was found on the posterior wall of the stomach. Removal of double       pigtail stents was accomplished with a snare and then the AXIOS with a       Raptor grasping device.      The cystenterostomy cavity was entered and was 3 cm in size at this  time. Healthy appearing granulation tissue and cyst wall was noted. No       evidence of persistent necrosis.      Patchy moderate inflammation characterized by erosions, erythema and       granularity was found in the entire examined stomach.      No other gross lesions were noted in the entire examined stomach.      No gross lesions were noted in the duodenal bulb, in the first portion       of the duodenum and in the second portion of the duodenum.      ENDOSONOGRAPHIC FINDING: :      There was no sign of significant endosonographic abnormality in the       common bile duct (3.0 mm) and in the common hepatic duct (4.0 mm) No       stones, no biliary sludge and ducts of normal caliber  were identified.      Pancreatic duct in the head was 1.0 mm. Impression:               EGD Impression:                           - No gross lesions in esophagus.                           - 2 cm hiatal hernia.                           - Pre-existing Cystgastrostomy stents in place -                            Removed.                           - Cystenterostomy cavity shows significant                            decompression and healthy appearing wall. No                            evidence of persistent necrosis present at this                            time.                           - Gastritis. No other gross lesions in the stomach.                            Previously biopsied and negative for H. pylori.                           - No gross lesions in the duodenal bulb, in the                            first portion of the duodenum and in the second  portion of the duodenum.                           EUS Impression:                           - There was no sign of significant pathology in the                            common bile duct and in the common hepatic duct. Recommendation:           - The patient will be observed post-procedure,                            until all discharge criteria are met.                           - Return patient to hospital ward for ongoing care.                           - Advance diet as tolerated.                           - Observe patient's clinical course.                           - May restart PPI at this time. Recommend twice                            daily for next 23-month and then decrease to once                            daily and maintain for whatever she needs in                            regards to maintenance of GERD symptoms.                           - Recommend 5-days of continued antibiotics and                            then may stop.                           - Recommend repeat imaging in  4-8 weeks depending                            on patient course.                           - Follow up with primary [facility], Dr. Watt Climes as                            outpatient. Happy to be re-involved if necessary in  the future.                           - The findings and recommendations were discussed                            with the patient.                           - The findings and recommendations were discussed                            with the patient's family. Procedure Code(s):        --- Professional ---                           [phone], Esophagogastroduodenoscopy, flexible,                            transoral; with endoscopic ultrasound examination                            limited to the esophagus, stomach or duodenum, and                            adjacent structures                           43247, Esophagogastroduodenoscopy, flexible,                            transoral; with removal of foreign body(s) Diagnosis Code(s):        --- Professional ---                           K44.9, Diaphragmatic hernia without obstruction or                            gangrene                           Z97.8, Presence of other specified devices                           K29.70, Gastritis, unspecified, without bleeding                           K85.90, Acute pancreatitis without necrosis or                            infection, unspecified CPT copyright 2019 [employer]. All rights reserved. The codes documented in this report are preliminary and upon coder review may  be revised to meet current compliance requirements. Justice Britain, MD 08/02/2020 11:34:28 AM Number of Addenda: 0

## 2020-08-03 DIAGNOSIS — F1120 Opioid dependence, uncomplicated: Secondary | ICD-10-CM | POA: Diagnosis not present

## 2020-08-03 DIAGNOSIS — K2970 Gastritis, unspecified, without bleeding: Secondary | ICD-10-CM

## 2020-08-03 DIAGNOSIS — K2990 Gastroduodenitis, unspecified, without bleeding: Secondary | ICD-10-CM

## 2020-08-03 DIAGNOSIS — E876 Hypokalemia: Secondary | ICD-10-CM | POA: Diagnosis not present

## 2020-08-03 DIAGNOSIS — K859 Acute pancreatitis without necrosis or infection, unspecified: Secondary | ICD-10-CM | POA: Diagnosis not present

## 2020-08-03 NOTE — Progress Notes (Signed)
PROGRESS NOTE    Tricia RosserShannon Brown  ZOX:[id number] DOB: 14-Jul-1976 DOA: 07/10/2020 PCP: [name], [name], MD   Brief Narrative:  HPI on 07/10/2020 by Dr. Beola CordAlexander Brown Tricia Brown is a 44 y.o. female with medical history significant of endometriosis status post diagnostic laparoscopy and hysterectomy, IBS-D, GERD, status post cholecystectomy who presents with abdominal pain.  Patient reports 4 days of constant epigastric and left upper quadrant abdominal pain.  She also reports associated nausea, vomiting, and diarrhea.  The pain is a constant 4 out of 10 with flares to 8 out of 10 with certain movements or pressure on her abdomen and following meals.  She tried Tums at home which did not help and she takes a daily PPI.  She noted that her pain started radiating to her right chest and back today and came into the ED for evaluation.  She denies fever, cough, shortness of breath.  She reports only weekend alcohol use of about 6 pack on occasion.  Interim history Patient admitted with acute pancreatitis with infected pseudocyst.  Gastroenterology consulted and appreciated.  Patient is improving slowly with pain control continues to be the issue. Assessment & Plan   Acute pancreatitis with infected pseudocyst -CT on admission showed stranding around the pancreatic body and tail compatible with acute pancreatitis, adjacent fluid collections with the largest being 3.8 cm most compatible with acute fluid collection and developing pseudocyst. -MRCP did not show cholelithiasis -CT on 07/21/2020 showed a well organized cystic structure measuring 9.9 x 620 x 11.6 cm -Gastroenterology consulted and appreciated -Status post EUS showing diffuse yellow plaques found in the entire esophagus, and extrinsic compression noted in the posterior wall of the stomach, multiple cysts identified in the pancreatic body and pancreatic tail, AXIOS cystogastrostomy was performed.  Plastic double pigtail stents placed due  to decreased risk of new chroma closure of stent -CT 11/5 showed improvement of pseudocyst -Status post EUS 08/02/2020 with removal of AXIOS stent.  -Gastroenterology recommended 5 additional days of antibiotics -She initially was placed on Zosyn however transitioned to Augmentin -Continue Creon -Patient currently tolerating diet -Continue pain control, trying to wean IV narcotic use -Discussed with gastroenterology, Dr.Karki, patient will need repeat imaging in 4 to 8 weeks depending on her clinical course.  Continue Augmentin  Diarrhea -Initially started on vancomycin which was discontinued after colonoscopy -C. difficile ordered again by gastroenterology which was unremarkable -Status post flex sig on 07/29/20: Biopsy showed benign colon mucosa -Continue Imodium and Lomotil -Stools are now forming and watery stools are slowing down with thicker consistency  Candidal esophagitis -Continue diet  Hypokalemia -Resolved with replacement, continue to monitor  Normocytic anemia -Hemoglobin currently stable, 9.7  Thrombocytosis -Likely reactive to infection -Improving  DVT Prophylaxis SCDs  Code Status: Full  Family Communication: None at bedside  Disposition Plan:  Status is: Inpatient  Remains inpatient appropriate because:Ongoing active pain requiring inpatient pain management   Dispo: The patient is from: Home              Anticipated d/c is to: Home              Anticipated d/c date is: 3 days              Patient currently is not medically stable to d/c.  Consultants Gastroenterology  Procedures  EGD EUS x2 Flexible sigmoidoscopy  Antibiotics   Anti-infectives (From admission, onward)   Start     Dose/Rate Route Frequency Ordered Stop   08/02/20 1200  amoxicillin-clavulanate (AUGMENTIN)  875-125 MG per tablet 1 tablet        1 tablet Oral Every 12 hours 08/02/20 1145     07/29/20 1200  piperacillin-tazobactam (ZOSYN) IVPB 3.375 g  Status:  Discontinued          3.375 g 12.5 mL/hr over 240 Minutes Intravenous Every 8 hours 07/29/20 1101 08/02/20 1145   07/28/20 1400  Ampicillin-Sulbactam (UNASYN) 3 g in sodium chloride 0.9 % 100 mL IVPB  Status:  Discontinued        3 g 200 mL/hr over 30 Minutes Intravenous Every 6 hours 07/28/20 1052 07/29/20 1101   07/26/20 1030  vancomycin (VANCOCIN) 50 mg/mL oral solution 125 mg  Status:  Discontinued        125 mg Oral 4 times daily 07/26/20 0930 07/30/20 0935   07/25/20 1030  fluconazole (DIFLUCAN) tablet 200 mg        200 mg Oral Daily 07/25/20 0936 08/08/20 0959   07/22/20 2030  piperacillin-tazobactam (ZOSYN) IVPB 3.375 g  Status:  Discontinued        3.375 g 12.5 mL/hr over 240 Minutes Intravenous Every 8 hours 07/22/20 1944 07/28/20 1050   07/22/20 1400  piperacillin-tazobactam (ZOSYN) IVPB 3.375 g        3.375 g 100 mL/hr over 30 Minutes Intravenous 60 min pre-op 07/21/20 0951 07/22/20 1500      Subjective:   [name] seen and examined today.  Continues to have loose bowel movements but states that they are thicker consistency.  Able to tolerate food but does have some pain.  Denies current chest pain or shortness of breath, nausea or vomiting, dizziness or headache.  Objective:   Vitals:   08/03/20 0500 08/03/20 0718 08/03/20 1019 08/03/20 1250  BP:   132/79 136/79  Pulse:   74 72  Resp:   17 18  Temp:   98 F (36.7 C) 98.2 F (36.8 C)  TempSrc:   Oral Oral  SpO2:  98% 99% 100%  Weight: 62.4 kg     Height:        Intake/Output Summary (Last 24 hours) at 08/03/2020 1507 Last data filed at 08/03/2020 1000 Gross per 24 hour  Intake 1427.21 ml  Output 5350 ml  Net -3922.79 ml   Filed Weights   08/01/20 1606 08/02/20 0500 08/03/20 0500  Weight: 60.1 kg 60.7 kg 62.4 kg    Exam  General: Well developed, well nourished, NAD, appears stated age  HEENT: NCAT, mucous membranes moist.   Cardiovascular: S1 S2 auscultated,   Respiratory: Clear to auscultation  bilaterally  Abdomen: Soft, RUQ/epigastric TTP, nondistended, + bowel sounds  Extremities: warm dry without cyanosis clubbing or edema  Neuro: AAOx3, nonfocal  Psych: Normal affect and demeanor with intact judgement and insight   Data Reviewed: I have personally reviewed following labs and imaging studies  CBC: Recent Labs  Lab 07/29/20 0543 07/31/20 0452 08/02/20 0554  WBC 9.0 10.2 11.9*  HGB 10.3* 9.9* 9.7*  HCT 32.6* 31.5* 30.4*  MCV 95.6 96.9 95.0  PLT 722* 698* 551*   Basic Metabolic Panel: Recent Labs  Lab 07/29/20 0543 07/31/20 0452 08/02/20 0554  NA 139 141 140  K 3.9 4.7 4.2  CL 104 107 107  CO2 24 25 24   GLUCOSE 80 118* 97  BUN <5* <5* <5*  CREATININE 0.79 0.83 1.01*  CALCIUM 9.6 9.3 9.2   GFR: Estimated Creatinine Clearance: 69.1 mL/min (A) (by C-G formula based on SCr of 1.01 mg/dL (H)). Liver Function  Tests: Recent Labs  Lab 07/29/20 0543 07/31/20 0452 08/02/20 0554  AST 30 15 15   ALT 37 23 19  ALKPHOS 243* 197* 157*  BILITOT 0.4 0.3 0.5  PROT 6.8 5.8* 5.9*  ALBUMIN 2.8* 2.6* 2.6*   No results for input(s): LIPASE, AMYLASE in the last 168 hours. No results for input(s): AMMONIA in the last 168 hours. Coagulation Profile: No results for input(s): INR, PROTIME in the last 168 hours. Cardiac Enzymes: No results for input(s): CKTOTAL, CKMB, CKMBINDEX, TROPONINI in the last 168 hours. BNP (last 3 results) No results for input(s): PROBNP in the last 8760 hours. HbA1C: No results for input(s): HGBA1C in the last 72 hours. CBG: No results for input(s): GLUCAP in the last 168 hours. Lipid Profile: No results for input(s): CHOL, HDL, LDLCALC, TRIG, CHOLHDL, LDLDIRECT in the last 72 hours. Thyroid Function Tests: No results for input(s): TSH, T4TOTAL, FREET4, T3FREE, THYROIDAB in the last 72 hours. Anemia Panel: No results for input(s): VITAMINB12, FOLATE, FERRITIN, TIBC, IRON, RETICCTPCT in the last 72 hours. Urine analysis:    Component  Value Date/Time   COLORURINE STRAW (A) 07/10/2020 1444   APPEARANCEUR CLEAR 07/10/2020 1444   APPEARANCEUR CLEAR 03/23/2013 1621   LABSPEC 1.003 (L) 07/10/2020 1444   LABSPEC 1.021 03/23/2013 1621   PHURINE 7.0 07/10/2020 1444   GLUCOSEU NEGATIVE 07/10/2020 1444   GLUCOSEU see comment 03/23/2013 1621   HGBUR NEGATIVE 07/10/2020 1444   BILIRUBINUR NEGATIVE 07/10/2020 1444   BILIRUBINUR see comment 03/23/2013 1621   KETONESUR NEGATIVE 07/10/2020 1444   PROTEINUR NEGATIVE 07/10/2020 1444   UROBILINOGEN 1.0 09/29/2014 2116   NITRITE NEGATIVE 07/10/2020 1444   LEUKOCYTESUR NEGATIVE 07/10/2020 1444   LEUKOCYTESUR see comment 03/23/2013 1621   Sepsis Labs: @LABRCNTIP (procalcitonin:4,lacticidven:4)  ) Recent Results (from the past 240 hour(s))  C Difficile Quick Screen (NO PCR Reflex)     Status: Abnormal   Collection Time: 07/25/20  2:37 PM   Specimen: Stool  Result Value Ref Range Status   C Diff antigen POSITIVE (A) NEGATIVE Final   C Diff toxin NEGATIVE NEGATIVE Final   C Diff interpretation   Final    Results are indeterminate. Please contact your campus specific ID physician or CMO.    Comment: Performed at [facility], [address] [place], [address] [place]  C Difficile Quick Screen w PCR reflex     Status: None   Collection Time: 07/31/20  2:13 PM  Result Value Ref Range Status   C Diff antigen NEGATIVE NEGATIVE Final   C Diff toxin NEGATIVE NEGATIVE Final   C Diff interpretation No C. difficile detected.  Final    Comment: Performed at [facility], [address] [place], [address] [place]      Radiology Studies: No results found.   Scheduled Meds: . amoxicillin-clavulanate  1 tablet Oral Q12H  . diphenoxylate-atropine  5 mL Oral TID  . estradiol  1 mg Oral Daily  . feeding supplement  237 mL Oral BID BM  . fluconazole  200 mg Oral Daily  . hydrocortisone   Rectal TID  . mometasone-formoterol  2 puff Inhalation BID  . multivitamin with  minerals  1 tablet Oral Daily  . pantoprazole  40 mg Oral BID AC  . sodium chloride flush  3 mL Intravenous Q12H   Continuous Infusions: . lactated ringers 50 mL/hr at 08/03/20 0605     LOS: 24 days   Time Spent in minutes   45 minutes  Tatisha Cerino D.O. on 08/03/2020 at 3:07 PM  Between 7am to 7pm - Please see pager noted on [email or web address]  After 7pm go to [email or web address]  And look for the night coverage person covering for me after hours  [facility] Office  [phone]

## 2020-08-03 NOTE — Progress Notes (Signed)
Subjective: Patient was walking around the hallways. She was seen and examined subsequently in her room. She has been able to tolerate diet and has been having loose stools, however continues to have abdominal pain up to 7 out of 10 in intensity which radiates to her chest and shoulders.  Objective: Vital signs in last 24 hours: Temp:  [97.5 F (36.4 C)-98.2 F (36.8 C)] 98.2 F (36.8 C) (11/13 1250) Pulse Rate:  [72-86] 72 (11/13 1250) Resp:  [17-18] 18 (11/13 1250) BP: (125-136)/(60-79) 136/79 (11/13 1250) SpO2:  [98 %-100 %] 100 % (11/13 1250) Weight:  [62.4 kg] 62.4 kg (11/13 0500) Weight change: 2.3 kg Last BM Date: 08/03/20  PE: Thinly built, mild pallor, no icterus GENERAL: Able to speak in full sentences, not in distress ABDOMEN: Soft, nondistended, normal active bowel sounds, mild tenderness in epigastric and right upper quadrant EXTREMITIES: No deformity  Lab Results: Results for orders placed or performed during the hospital encounter of 07/10/20 (from the past 48 hour(s))  CBC     Status: Abnormal   Collection Time: 08/02/20  5:54 AM  Result Value Ref Range   WBC 11.9 (H) 4.0 - 10.5 K/uL   RBC 3.20 (L) 3.87 - 5.11 MIL/uL   Hemoglobin 9.7 (L) 12.0 - 15.0 g/dL   HCT 90.2 (L) 36 - 46 %   MCV 95.0 80.0 - 100.0 fL   MCH 30.3 26.0 - 34.0 pg   MCHC 31.9 30.0 - 36.0 g/dL   RDW 40.9 73.5 - 32.9 %   Platelets 551 (H) 150 - 400 K/uL   nRBC 0.0 0.0 - 0.2 %    Comment: Performed at [facility], [address] [place], [place] [address]  Comprehensive metabolic panel     Status: Abnormal   Collection Time: 08/02/20  5:54 AM  Result Value Ref Range   Sodium 140 135 - 145 mmol/L   Potassium 4.2 3.5 - 5.1 mmol/L   Chloride 107 98 - 111 mmol/L   CO2 24 22 - 32 mmol/L   Glucose, Bld 97 70 - 99 mg/dL    Comment: Glucose reference range applies only to samples taken after fasting for at least 8 hours.   BUN <5 (L) 6 - 20 mg/dL   Creatinine, Ser 8.34 (H) 0.44 - 1.00  mg/dL   Calcium 9.2 8.9 - 19.6 mg/dL   Total Protein 5.9 (L) 6.5 - 8.1 g/dL   Albumin 2.6 (L) 3.5 - 5.0 g/dL   AST 15 15 - 41 U/L   ALT 19 0 - 44 U/L   Alkaline Phosphatase 157 (H) 38 - 126 U/L   Total Bilirubin 0.5 0.3 - 1.2 mg/dL   GFR, Estimated >22 >29 mL/min    Comment: (NOTE) Calculated using the CKD-EPI Creatinine Equation (2021)    Anion gap 9 5 - 15    Comment: Performed at [facility], [address] [place], [place] [address]    Studies/Results: No results found.  Medications: I have reviewed the patient's current medications.  Assessment: Status post cystogastrostomy for necrotizing pancreatitis, with removal of stents performed yesterday Minimal leukocytosis, mild normocytic anemia, thrombocytosis Normal renal function Low total protein of 5.9 and albumin of 2.6  Plan: Tolerating soft diet, on Augmentin-recommend 2 to 5 days of treatment On PPI twice daily On pancreatic enzymes and loperamide 2 mg 3 times daily as needed and Lomotil 5 mL 3 times daily for diarrhea Currently receiving oxycodone 10 mg every 4 hours as  needed pain along with hydromorphone 1 mg IV every 4 hours as needed severe pain Hopefully can be discharged in 1 to 2 days with adequate pain control. Will need repeat imaging in 4 to 8 weeks depending upon clinical course. Dr. Dulce Sellar to follow tomorrow in a.m.Marland Kitchen  Kerin Salen, MD 08/03/2020, 2:40 PM

## 2020-08-04 DIAGNOSIS — F1120 Opioid dependence, uncomplicated: Secondary | ICD-10-CM | POA: Diagnosis not present

## 2020-08-04 DIAGNOSIS — K2970 Gastritis, unspecified, without bleeding: Secondary | ICD-10-CM | POA: Diagnosis not present

## 2020-08-04 DIAGNOSIS — E876 Hypokalemia: Secondary | ICD-10-CM | POA: Diagnosis not present

## 2020-08-04 DIAGNOSIS — K859 Acute pancreatitis without necrosis or infection, unspecified: Secondary | ICD-10-CM | POA: Diagnosis not present

## 2020-08-04 LAB — CBC
HCT: 30.8 % — ABNORMAL LOW (ref 36.0–46.0)
Hemoglobin: 9.7 g/dL — ABNORMAL LOW (ref 12.0–15.0)
MCH: 30.5 pg (ref 26.0–34.0)
MCHC: 31.5 g/dL (ref 30.0–36.0)
MCV: 96.9 fL (ref 80.0–100.0)
Platelets: 434 10*3/uL — ABNORMAL HIGH (ref 150–400)
RBC: 3.18 MIL/uL — ABNORMAL LOW (ref 3.87–5.11)
RDW: 14.3 % (ref 11.5–15.5)
WBC: 12.2 10*3/uL — ABNORMAL HIGH (ref 4.0–10.5)
nRBC: 0 % (ref 0.0–0.2)

## 2020-08-04 LAB — COMPREHENSIVE METABOLIC PANEL
ALT: 17 U/L (ref 0–44)
AST: 15 U/L (ref 15–41)
Albumin: 2.4 g/dL — ABNORMAL LOW (ref 3.5–5.0)
Alkaline Phosphatase: 134 U/L — ABNORMAL HIGH (ref 38–126)
Anion gap: 8 (ref 5–15)
BUN: <5 mg/dL — ABNORMAL LOW (ref 6–20)
CO2: 24 mmol/L (ref 22–32)
Calcium: 8.9 mg/dL (ref 8.9–10.3)
Chloride: 107 mmol/L (ref 98–111)
Creatinine, Ser: 0.86 mg/dL (ref 0.44–1.00)
GFR, Estimated: >60 mL/min (ref >60)
Glucose, Bld: 100 mg/dL — ABNORMAL HIGH (ref 70–99)
Potassium: 3.9 mmol/L (ref 3.5–5.1)
Sodium: 139 mmol/L (ref 135–145)
Total Bilirubin: 0.4 mg/dL (ref 0.3–1.2)
Total Protein: 5.3 g/dL — ABNORMAL LOW (ref 6.5–8.1)

## 2020-08-04 MED ORDER — ACETAMINOPHEN 325 MG PO TABS
650.0000 mg | ORAL_TABLET | Freq: Four times a day (QID) | ORAL | Status: DC | PRN
  Administered 2020-08-04 (×2): 650 mg via ORAL
  Filled 2020-08-04 (×2): qty 2

## 2020-08-04 MED ORDER — OXYCODONE HCL 5 MG PO TABS
15.0000 mg | ORAL_TABLET | ORAL | Status: DC | PRN
  Administered 2020-08-04 – 2020-08-07 (×17): 15 mg via ORAL
  Filled 2020-08-04 (×17): qty 3

## 2020-08-04 MED ORDER — HYDROMORPHONE HCL 1 MG/ML IJ SOLN
0.5000 mg | INTRAMUSCULAR | Status: DC | PRN
  Administered 2020-08-04: 1 mg via INTRAVENOUS
  Administered 2020-08-04 (×2): 0.5 mg via INTRAVENOUS
  Administered 2020-08-05 – 2020-08-06 (×4): 1 mg via INTRAVENOUS
  Filled 2020-08-04 (×8): qty 1

## 2020-08-04 MED ORDER — SIMETHICONE 80 MG PO CHEW
80.0000 mg | CHEWABLE_TABLET | Freq: Four times a day (QID) | ORAL | Status: DC
  Administered 2020-08-04 – 2020-08-07 (×13): 80 mg via ORAL
  Filled 2020-08-04 (×14): qty 1

## 2020-08-04 NOTE — Progress Notes (Signed)
Subjective: Having ongoing abdominal pain.  Heating pad helps some.  Objective: Vital signs in last 24 hours: Temp:  [97.6 F (36.4 C)-98.2 F (36.8 C)] 97.6 F (36.4 C) (11/14 0415) Pulse Rate:  [71-80] 71 (11/14 0415) Resp:  [17-18] 17 (11/14 0415) BP: (125-136)/(68-80) 125/80 (11/14 0415) SpO2:  [99 %-100 %] 99 % (11/14 0415) Weight:  [63.4 kg] 63.4 kg (11/14 0415) Weight change: 1 kg Last BM Date: 08/04/20  PE: GEN:  NAD ABD:  Moderate distended, mild tenderness epigastrium with some voluntary guarding.  Lab Results: CBC    Component Value Date/Time   WBC 12.2 (H) 08/04/2020 0309   RBC 3.18 (L) 08/04/2020 0309   HGB 9.7 (L) 08/04/2020 0309   HGB 13.0 03/23/2013 1621   HCT 30.8 (L) 08/04/2020 0309   HCT 37.7 03/23/2013 1621   PLT 434 (H) 08/04/2020 0309   PLT 235 03/23/2013 1621   MCV 96.9 08/04/2020 0309   MCV 93 03/23/2013 1621   MCH 30.5 08/04/2020 0309   MCHC 31.5 08/04/2020 0309   RDW 14.3 08/04/2020 0309   RDW 12.1 03/23/2013 1621   LYMPHSABS 2.3 07/25/2020 0037   LYMPHSABS 3.6 06/28/2012 0009   MONOABS 0.9 07/25/2020 0037   MONOABS 0.6 06/28/2012 0009   EOSABS 0.5 07/25/2020 0037   EOSABS 0.3 06/28/2012 0009   BASOSABS 0.2 (H) 07/25/2020 0037   BASOSABS 0.1 06/28/2012 0009   CMP     Component Value Date/Time   NA 139 08/04/2020 0309   NA 139 03/23/2013 1621   K 3.9 08/04/2020 0309   K 4.0 03/23/2013 1621   CL 107 08/04/2020 0309   CL 105 03/23/2013 1621   CO2 24 08/04/2020 0309   CO2 28 03/23/2013 1621   GLUCOSE 100 (H) 08/04/2020 0309   GLUCOSE 95 03/23/2013 1621   BUN <5 (L) 08/04/2020 0309   BUN 9 03/23/2013 1621   CREATININE 0.86 08/04/2020 0309   CREATININE 1.05 03/23/2013 1621   CALCIUM 8.9 08/04/2020 0309   CALCIUM 8.9 03/23/2013 1621   PROT 5.3 (L) 08/04/2020 0309   PROT 7.2 06/28/2012 0009   ALBUMIN 2.4 (L) 08/04/2020 0309   ALBUMIN 3.9 06/28/2012 0009   AST 15 08/04/2020 0309   AST 31 06/28/2012 0009   ALT 17 08/04/2020 0309    ALT 34 06/28/2012 0009   ALKPHOS 134 (H) 08/04/2020 0309   ALKPHOS 178 (H) 06/28/2012 0009   BILITOT 0.4 08/04/2020 0309   BILITOT 0.2 06/28/2012 0009   GFRNONAA >60 08/04/2020 0309   GFRNONAA >60 03/23/2013 1621   GFRAA >60 12/18/2019 1504   GFRAA >60 03/23/2013 1621   Assessment:  1.  Severe acute necrotizing pancreatitis, unclear etiology, has had endoscopic necrosectomy with Dr. Meridee Score.  Ongoing abdominal pain (IV analgesics help, but has rebound pain with oral therapy). 2.  Abdominal pain. 3.  Protein-calorie malnutrition.  Plan:  1.  Slow titration of analgesics down from IV to oral.  Patient clearly has reasons for pain, and it may take some time (days?) to wean her to an effective oral regimen. 2.  Continue pancreatic enzymes and Lomotil prn for diarrhea. 3.  Has been transitioned by Dr. Meridee Score to oral antibiotics post-necrosectomy. 4.  Soft diet; would not advance until we can get her analgesia better managed. 5.  [facility] will follow.   Freddy Jaksch 08/04/2020, 12:38 PM   Cell [phone] If no answer or after 5 PM call [phone]

## 2020-08-04 NOTE — Progress Notes (Signed)
PROGRESS NOTE    Tricia Brown  IZT:[id number] DOB: 1976/03/20 DOA: 07/10/2020 PCP: [name], MD   Brief Narrative:  HPI on 07/10/2020 by Dr. Beola Cord Tricia Brown is a 44 y.o. female with medical history significant of endometriosis status post diagnostic laparoscopy and hysterectomy, IBS-D, GERD, status post cholecystectomy who presents with abdominal pain.  Patient reports 4 days of constant epigastric and left upper quadrant abdominal pain.  She also reports associated nausea, vomiting, and diarrhea.  The pain is a constant 4 out of 10 with flares to 8 out of 10 with certain movements or pressure on her abdomen and following meals.  She tried Tums at home which did not help and she takes a daily PPI.  She noted that her pain started radiating to her right chest and back today and came into the ED for evaluation.  She denies fever, cough, shortness of breath.  She reports only weekend alcohol use of about 6 pack on occasion.  Interim history Patient admitted with acute pancreatitis with infected pseudocyst.  Gastroenterology consulted and appreciated.  Patient is improving slowly with pain control continues to be the issue. Assessment & Plan   Acute pancreatitis with infected pseudocyst -CT on admission showed stranding around the pancreatic body and tail compatible with acute pancreatitis, adjacent fluid collections with the largest being 3.8 cm most compatible with acute fluid collection and developing pseudocyst. -MRCP did not show cholelithiasis -CT on 07/21/2020 showed a well organized cystic structure measuring 9.9 x 620 x 11.6 cm -Gastroenterology consulted and appreciated -Status post EUS showing diffuse yellow plaques found in the entire esophagus, and extrinsic compression noted in the posterior wall of the stomach, multiple cysts identified in the pancreatic body and pancreatic tail, AXIOS cystogastrostomy was performed.  Plastic double pigtail stents placed due  to decreased risk of new chroma closure of stent -CT 11/5 showed improvement of pseudocyst -Status post EUS 08/02/2020 with removal of AXIOS stent.  -Gastroenterology recommended 5 additional days of antibiotics -She initially was placed on Zosyn however transitioned to Augmentin -Continue Creon -Patient currently tolerating diet -Discussed with gastroenterology, Dr.Karki, patient will need repeat imaging in 4 to 8 weeks depending on her clinical course.  Continue Augmentin -Continue pain control, trying to wean IV narcotic use.  Patient used 6 doses of IV Dilaudid in the past 24 hours as well as 5 doses of oral oxycodone.  Will increase oxycodone dose and decrease the amount of Dilaudid as well as increase the frequency from every 3 to every 4 hours as needed.  Diarrhea -Initially started on vancomycin which was discontinued after colonoscopy -C. difficile ordered again by gastroenterology which was unremarkable -Status post flex sig on 07/29/20: Biopsy showed benign colon mucosa -Continue Imodium and Lomotil -Stools are now forming and watery stools are slowing down with thicker consistency  Candidal esophagitis -Continue diet  Hypokalemia -Resolved with replacement, continue to monitor  Normocytic anemia -Hemoglobin stable, currently 9.7 -Continue to monitor CBC  Thrombocytosis -Likely reactive to infection -Improving, trending downward   DVT Prophylaxis SCDs  Code Status: Full  Family Communication: None at bedside  Disposition Plan:  Status is: Inpatient  Remains inpatient appropriate because:Ongoing active pain requiring inpatient pain management   Dispo: The patient is from: Home              Anticipated d/c is to: Home              Anticipated d/c date is: 3 days  Patient currently is not medically stable to d/c.  Consultants Gastroenterology  Procedures  EGD EUS x2 Flexible sigmoidoscopy  Antibiotics   Anti-infectives (From admission,  onward)   Start     Dose/Rate Route Frequency Ordered Stop   08/02/20 1200  amoxicillin-clavulanate (AUGMENTIN) 875-125 MG per tablet 1 tablet        1 tablet Oral Every 12 hours 08/02/20 1145     07/29/20 1200  piperacillin-tazobactam (ZOSYN) IVPB 3.375 g  Status:  Discontinued        3.375 g 12.5 mL/hr over 240 Minutes Intravenous Every 8 hours 07/29/20 1101 08/02/20 1145   07/28/20 1400  Ampicillin-Sulbactam (UNASYN) 3 g in sodium chloride 0.9 % 100 mL IVPB  Status:  Discontinued        3 g 200 mL/hr over 30 Minutes Intravenous Every 6 hours 07/28/20 1052 07/29/20 1101   07/26/20 1030  vancomycin (VANCOCIN) 50 mg/mL oral solution 125 mg  Status:  Discontinued        125 mg Oral 4 times daily 07/26/20 0930 07/30/20 0935   07/25/20 1030  fluconazole (DIFLUCAN) tablet 200 mg        200 mg Oral Daily 07/25/20 0936 08/08/20 0959   07/22/20 2030  piperacillin-tazobactam (ZOSYN) IVPB 3.375 g  Status:  Discontinued        3.375 g 12.5 mL/hr over 240 Minutes Intravenous Every 8 hours 07/22/20 1944 07/28/20 1050   07/22/20 1400  piperacillin-tazobactam (ZOSYN) IVPB 3.375 g        3.375 g 100 mL/hr over 30 Minutes Intravenous 60 min pre-op 07/21/20 0951 07/22/20 1500      Subjective:   Tricia Brown seen and examined today.  Continues to have abdominal pain and feels that the pain goes up into her chest.  Needs to have loose bowel movements but feels it is thicker in consistency.  Able to tolerate food but does have some abdominal pain.  Denies current chest pain or shortness of breath, dizziness or headache.   Objective:   Vitals:   08/03/20 1019 08/03/20 1250 08/03/20 2032 08/04/20 0415  BP: 132/79 136/79 128/68 125/80  Pulse: 74 72 80 71  Resp: 17 18 18 17   Temp: 98 F (36.7 C) 98.2 F (36.8 C) 97.8 F (36.6 C) 97.6 F (36.4 C)  TempSrc: Oral Oral Oral Oral  SpO2: 99% 100% 100% 99%  Weight:    63.4 kg  Height:        Intake/Output Summary (Last 24 hours) at 08/04/2020  1253 Last data filed at 08/04/2020 0659 Gross per 24 hour  Intake 1471.29 ml  Output 3400 ml  Net -1928.71 ml   Filed Weights   08/02/20 0500 08/03/20 0500 08/04/20 0415  Weight: 60.7 kg 62.4 kg 63.4 kg   Exam  General: Well developed, well nourished, NAD, appears stated age  HEENT: NCAT, mucous membranes moist.   Cardiovascular: S1 S2 auscultated, RRR  Respiratory: Clear to auscultation bilaterally  Abdomen: Soft, RUQ/epigastric TTP, nondistended, + bowel sounds  Extremities: warm dry without cyanosis clubbing or edema  Neuro: AAOx3, nonfocal  Psych: Appropriate mood and affect  Data Reviewed: I have personally reviewed following labs and imaging studies  CBC: Recent Labs  Lab 07/29/20 0543 07/31/20 0452 08/02/20 0554 08/04/20 0309  WBC 9.0 10.2 11.9* 12.2*  HGB 10.3* 9.9* 9.7* 9.7*  HCT 32.6* 31.5* 30.4* 30.8*  MCV 95.6 96.9 95.0 96.9  PLT 722* 698* 551* 434*   Basic Metabolic Panel: Recent Labs  Lab 07/29/20  0543 07/31/20 0452 08/02/20 0554 08/04/20 0309  NA 139 141 140 139  K 3.9 4.7 4.2 3.9  CL 104 107 107 107  CO2 24 25 24 24   GLUCOSE 80 118* 97 100*  BUN <5* <5* <5* <5*  CREATININE 0.79 0.83 1.01* 0.86  CALCIUM 9.6 9.3 9.2 8.9   GFR: Estimated Creatinine Clearance: 81.2 mL/min (by C-G formula based on SCr of 0.86 mg/dL). Liver Function Tests: Recent Labs  Lab 07/29/20 0543 07/31/20 0452 08/02/20 0554 08/04/20 0309  AST 30 15 15 15   ALT 37 23 19 17   ALKPHOS 243* 197* 157* 134*  BILITOT 0.4 0.3 0.5 0.4  PROT 6.8 5.8* 5.9* 5.3*  ALBUMIN 2.8* 2.6* 2.6* 2.4*   No results for input(s): LIPASE, AMYLASE in the last 168 hours. No results for input(s): AMMONIA in the last 168 hours. Coagulation Profile: No results for input(s): INR, PROTIME in the last 168 hours. Cardiac Enzymes: No results for input(s): CKTOTAL, CKMB, CKMBINDEX, TROPONINI in the last 168 hours. BNP (last 3 results) No results for input(s): PROBNP in the last 8760  hours. HbA1C: No results for input(s): HGBA1C in the last 72 hours. CBG: No results for input(s): GLUCAP in the last 168 hours. Lipid Profile: No results for input(s): CHOL, HDL, LDLCALC, TRIG, CHOLHDL, LDLDIRECT in the last 72 hours. Thyroid Function Tests: No results for input(s): TSH, T4TOTAL, FREET4, T3FREE, THYROIDAB in the last 72 hours. Anemia Panel: No results for input(s): VITAMINB12, FOLATE, FERRITIN, TIBC, IRON, RETICCTPCT in the last 72 hours. Urine analysis:    Component Value Date/Time   COLORURINE STRAW (A) 07/10/2020 1444   APPEARANCEUR CLEAR 07/10/2020 1444   APPEARANCEUR CLEAR 03/23/2013 1621   LABSPEC 1.003 (L) 07/10/2020 1444   LABSPEC 1.021 03/23/2013 1621   PHURINE 7.0 07/10/2020 1444   GLUCOSEU NEGATIVE 07/10/2020 1444   GLUCOSEU see comment 03/23/2013 1621   HGBUR NEGATIVE 07/10/2020 1444   BILIRUBINUR NEGATIVE 07/10/2020 1444   BILIRUBINUR see comment 03/23/2013 1621   KETONESUR NEGATIVE 07/10/2020 1444   PROTEINUR NEGATIVE 07/10/2020 1444   UROBILINOGEN 1.0 09/29/2014 2116   NITRITE NEGATIVE 07/10/2020 1444   LEUKOCYTESUR NEGATIVE 07/10/2020 1444   LEUKOCYTESUR see comment 03/23/2013 1621   Sepsis Labs: @LABRCNTIP (procalcitonin:4,lacticidven:4)  ) Recent Results (from the past 240 hour(s))  C Difficile Quick Screen (NO PCR Reflex)     Status: Abnormal   Collection Time: 07/25/20  2:37 PM   Specimen: Stool  Result Value Ref Range Status   C Diff antigen POSITIVE (A) NEGATIVE Final   C Diff toxin NEGATIVE NEGATIVE Final   C Diff interpretation   Final    Results are indeterminate. Please contact your campus specific ID physician or CMO.    Comment: Performed at [facility], [address], [place], 13/04/21 [facility]  C Difficile Quick Screen w PCR reflex     Status: None   Collection Time: 07/31/20  2:13 PM  Result Value Ref Range Status   C Diff antigen NEGATIVE NEGATIVE Final   C Diff toxin NEGATIVE NEGATIVE Final   C Diff  interpretation No C. difficile detected.  Final    Comment: Performed at [facility], [address], [place], 13/10/21 [facility]      Radiology Studies: No results found.   Scheduled Meds: . amoxicillin-clavulanate  1 tablet Oral Q12H  . diphenoxylate-atropine  5 mL Oral TID  . estradiol  1 mg Oral Daily  . feeding supplement  237 mL Oral BID BM  .  fluconazole  200 mg Oral Daily  . hydrocortisone   Rectal TID  . mometasone-formoterol  2 puff Inhalation BID  . multivitamin with minerals  1 tablet Oral Daily  . pantoprazole  40 mg Oral BID AC  . simethicone  80 mg Oral QID  . sodium chloride flush  3 mL Intravenous Q12H   Continuous Infusions: . lactated ringers 50 mL/hr at 08/04/20 0659     LOS: 25 days   Time Spent in minutes   45 minutes  Mikenna Bunkley D.O. on 08/04/2020 at 12:53 PM  Between 7am to 7pm - Please see pager noted on [email or web address]  After 7pm go to [email or web address]  And look for the night coverage person covering for me after hours  [facility] Office  [phone]

## 2020-08-05 ENCOUNTER — Encounter (HOSPITAL_COMMUNITY): Payer: Self-pay | Admitting: Gastroenterology

## 2020-08-05 DIAGNOSIS — E876 Hypokalemia: Secondary | ICD-10-CM | POA: Diagnosis not present

## 2020-08-05 DIAGNOSIS — K859 Acute pancreatitis without necrosis or infection, unspecified: Secondary | ICD-10-CM | POA: Diagnosis not present

## 2020-08-05 DIAGNOSIS — F333 Major depressive disorder, recurrent, severe with psychotic symptoms: Secondary | ICD-10-CM | POA: Diagnosis not present

## 2020-08-05 DIAGNOSIS — K2970 Gastritis, unspecified, without bleeding: Secondary | ICD-10-CM | POA: Diagnosis not present

## 2020-08-05 MED ORDER — DICYCLOMINE HCL 10 MG PO CAPS
10.0000 mg | ORAL_CAPSULE | Freq: Three times a day (TID) | ORAL | Status: DC
  Administered 2020-08-05 – 2020-08-07 (×8): 10 mg via ORAL
  Filled 2020-08-05 (×8): qty 1

## 2020-08-05 NOTE — Progress Notes (Signed)
Ok to add stop date of 5d for Augmentin from EGD per Dr Catha Gosselin.  Ulyses Southward, PharmD, BCIDP, AAHIVP, CPP Infectious Disease Pharmacist 08/05/2020 2:06 PM

## 2020-08-05 NOTE — Progress Notes (Signed)
[facility] Progress Note  Tricia Brown 44 y.o. 1976-03-08  CC: Necrotizing pancreatitis  Subjective: Patient reports continued abdominal pain, mostly located in the upper abdomen with radiation into her chest and shoulders.  She has had some nausea today but denies vomiting.  Had grits this morning, but did not have dinner last night.  Continues to have diarrhea.  Reports 4 episodes of small-volume diarrhea thus far today.  Also reports worse abdominal pain and diarrhea after eating.  ROS : Review of Systems  Cardiovascular: Positive for chest pain. Negative for palpitations.  Gastrointestinal: Positive for abdominal pain, diarrhea and nausea. Negative for blood in stool, constipation, heartburn, melena and vomiting.   Objective: Vital signs in last 24 hours: Vitals:   08/04/20 2006 08/05/20 0500  BP: 131/71 129/86  Pulse: 78 76  Resp: 17 18  Temp: 98 F (36.7 C) 98.1 F (36.7 C)  SpO2: 100% 100%    Physical Exam:  General:  Lethargic, oriented, cooperative, no distress  Head:  Normocephalic, without obvious abnormality, atraumatic  Eyes:  Anicteric sclera, EOMs intact   Lungs:   Clear to auscultation bilaterally, respirations unlabored  Heart:  Regular rate and rhythm, S1, S2 normal  Abdomen:   Soft, non-distended, mild to moderate epigastric tenderness, bowel sounds active all four quadrants, no peritoneal signs  Extremities: Extremities normal, atraumatic, no  edema  Pulses: 2+ and symmetric    Lab Results: Recent Labs    08/04/20 0309  NA 139  K 3.9  CL 107  CO2 24  GLUCOSE 100*  BUN <5*  CREATININE 0.86  CALCIUM 8.9   Recent Labs    08/04/20 0309  AST 15  ALT 17  ALKPHOS 134*  BILITOT 0.4  PROT 5.3*  ALBUMIN 2.4*   Recent Labs    08/04/20 0309  WBC 12.2*  HGB 9.7*  HCT 30.8*  MCV 96.9  PLT 434*   No results for input(s): LABPROT, INR in the last 72 hours.    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  Continue Creon.  Continue Lomotil. Continue Protonix BID.  Continue to transition to PO pain medications if possible.  Schedule Bentyl TID prior to meals for abdominal cramping and diarrhea.  [facility] will follow.  Edrick Kins PA-C 08/05/2020, 8:37 AM  Contact #  [phone]

## 2020-08-05 NOTE — Progress Notes (Signed)
PROGRESS NOTE    Tricia Brown  QIH:[id number] DOB: Sep 09, 1976 DOA: 07/10/2020 PCP: [name], MD   Brief Narrative:  HPI on 07/10/2020 by Dr. Beola Cord Tricia Brown is a 44 y.o. female with medical history significant of endometriosis status post diagnostic laparoscopy and hysterectomy, IBS-D, GERD, status post cholecystectomy who presents with abdominal pain.  Patient reports 4 days of constant epigastric and left upper quadrant abdominal pain.  She also reports associated nausea, vomiting, and diarrhea.  The pain is a constant 4 out of 10 with flares to 8 out of 10 with certain movements or pressure on her abdomen and following meals.  She tried Tums at home which did not help and she takes a daily PPI.  She noted that her pain started radiating to her right chest and back today and came into the ED for evaluation.  She denies fever, cough, shortness of breath.  She reports only weekend alcohol use of about 6 pack on occasion.  Interim history Patient admitted with acute pancreatitis with infected pseudocyst.  Gastroenterology consulted and appreciated.  Patient is improving slowly with pain control continues to be the issue. Assessment & Plan   Acute pancreatitis with infected pseudocyst -CT on admission showed stranding around the pancreatic body and tail compatible with acute pancreatitis, adjacent fluid collections with the largest being 3.8 cm most compatible with acute fluid collection and developing pseudocyst. -MRCP did not show cholelithiasis -CT on 07/21/2020 showed a well organized cystic structure measuring 9.9 x 620 x 11.6 cm -Gastroenterology consulted and appreciated -Status post EUS showing diffuse yellow plaques found in the entire esophagus, and extrinsic compression noted in the posterior wall of the stomach, multiple cysts identified in the pancreatic body and pancreatic tail, AXIOS cystogastrostomy was performed.  Plastic double pigtail stents placed due  to decreased risk of new chroma closure of stent -CT 11/5 showed improvement of pseudocyst -Status post EUS 08/02/2020 with removal of AXIOS stent.  -Gastroenterology recommended 5 additional days of antibiotics -She initially was placed on Zosyn however transitioned to Augmentin -Continue Creon -Patient currently tolerating diet- but continues to have some pain after eating -Discussed with gastroenterology, Dr.Karki, patient will need repeat imaging in 4 to 8 weeks depending on her clinical course.  Continue Augmentin -On 08/04/2020, increased oxycodone to 15 mg and decreased Dilaudid dose of 6.5-1 every 4 hours as needed -Patient has used less IV Dilaudid but continues to need it due to uncontrollable pain -Also added simethicone  Diarrhea -Initially started on vancomycin which was discontinued after colonoscopy -C. difficile ordered again by gastroenterology which was unremarkable -Status post flex sig on 07/29/20: Biopsy showed benign colon mucosa -Continue Imodium and Lomotil -Stools are now forming and watery stools are slowing down with thicker consistency -Discussed with gastroenterology today, will schedule Bentyl to see if this helps  Candidal esophagitis -Continue diet  Hypokalemia -Resolved with replacement, continue to monitor  Normocytic anemia -Hemoglobin stable, currently 9.7 -Continue to monitor CBC  Thrombocytosis -Likely reactive to infection -Improving, trending downward   DVT Prophylaxis SCDs  Code Status: Full  Family Communication: None at bedside  Disposition Plan:  Status is: Inpatient  Remains inpatient appropriate because:Ongoing active pain requiring inpatient pain management   Dispo: The patient is from: Home              Anticipated d/c is to: Home              Anticipated d/c date is: 3 days  Patient currently is not medically stable to d/c.  Consultants Gastroenterology  Procedures  EGD EUS x2 Flexible  sigmoidoscopy  Antibiotics   Anti-infectives (From admission, onward)   Start     Dose/Rate Route Frequency Ordered Stop   08/02/20 1200  amoxicillin-clavulanate (AUGMENTIN) 875-125 MG per tablet 1 tablet        1 tablet Oral Every 12 hours 08/02/20 1145     07/29/20 1200  piperacillin-tazobactam (ZOSYN) IVPB 3.375 g  Status:  Discontinued        3.375 g 12.5 mL/hr over 240 Minutes Intravenous Every 8 hours 07/29/20 1101 08/02/20 1145   07/28/20 1400  Ampicillin-Sulbactam (UNASYN) 3 g in sodium chloride 0.9 % 100 mL IVPB  Status:  Discontinued        3 g 200 mL/hr over 30 Minutes Intravenous Every 6 hours 07/28/20 1052 07/29/20 1101   07/26/20 1030  vancomycin (VANCOCIN) 50 mg/mL oral solution 125 mg  Status:  Discontinued        125 mg Oral 4 times daily 07/26/20 0930 07/30/20 0935   07/25/20 1030  fluconazole (DIFLUCAN) tablet 200 mg        200 mg Oral Daily 07/25/20 0936 08/08/20 0959   07/22/20 2030  piperacillin-tazobactam (ZOSYN) IVPB 3.375 g  Status:  Discontinued        3.375 g 12.5 mL/hr over 240 Minutes Intravenous Every 8 hours 07/22/20 1944 07/28/20 1050   07/22/20 1400  piperacillin-tazobactam (ZOSYN) IVPB 3.375 g        3.375 g 100 mL/hr over 30 Minutes Intravenous 60 min pre-op 07/21/20 0951 07/22/20 1500      Subjective:   Tricia Brown seen and examined today.  Currently in tears.  Continues to have abdominal pain that goes to her back and chest.  Feels that certain foods do exacerbate her abdominal pain.  Continues to have loose bowel movements which she feels is making her pain worse.  Currently denies chest pain or shortness of breath, dizziness or headache.    Objective:   Vitals:   08/04/20 0415 08/04/20 1428 08/04/20 2006 08/05/20 0500  BP: 125/80 134/84 131/71 129/86  Pulse: 71 79 78 76  Resp: 17 18 17 18   Temp: 97.6 F (36.4 C) 98 F (36.7 C) 98 F (36.7 C) 98.1 F (36.7 C)  TempSrc: Oral Oral Oral Oral  SpO2: 99% 100% 100% 100%  Weight: 63.4 kg      Height:        Intake/Output Summary (Last 24 hours) at 08/05/2020 1114 Last data filed at 08/05/2020 0800 Gross per 24 hour  Intake --  Output 3350 ml  Net -3350 ml   Filed Weights   08/02/20 0500 08/03/20 0500 08/04/20 0415  Weight: 60.7 kg 62.4 kg 63.4 kg   Exam  General: Well developed, well nourished, NAD  HEENT: NCAT, mucous membranes moist.   Cardiovascular: S1 S2 auscultated, RRR, no murmur  Respiratory: Clear to auscultation bilaterally  Abdomen: Soft, RUQ/epigastric TTP, nondistended, + bowel sounds  Extremities: warm dry without cyanosis clubbing or edema  Neuro: AAOx3, nonfocal  Psych: very tearful this morning, although has appropriate mood and affect  Data Reviewed: I have personally reviewed following labs and imaging studies  CBC: Recent Labs  Lab 07/31/20 0452 08/02/20 0554 08/04/20 0309  WBC 10.2 11.9* 12.2*  HGB 9.9* 9.7* 9.7*  HCT 31.5* 30.4* 30.8*  MCV 96.9 95.0 96.9  PLT 698* 551* 434*   Basic Metabolic Panel: Recent Labs  Lab 07/31/20 0452  08/02/20 0554 08/04/20 0309  NA 141 140 139  K 4.7 4.2 3.9  CL 107 107 107  CO2 25 24 24   GLUCOSE 118* 97 100*  BUN <5* <5* <5*  CREATININE 0.83 1.01* 0.86  CALCIUM 9.3 9.2 8.9   GFR: Estimated Creatinine Clearance: 81.2 mL/min (by C-G formula based on SCr of 0.86 mg/dL). Liver Function Tests: Recent Labs  Lab 07/31/20 0452 08/02/20 0554 08/04/20 0309  AST 15 15 15   ALT 23 19 17   ALKPHOS 197* 157* 134*  BILITOT 0.3 0.5 0.4  PROT 5.8* 5.9* 5.3*  ALBUMIN 2.6* 2.6* 2.4*   No results for input(s): LIPASE, AMYLASE in the last 168 hours. No results for input(s): AMMONIA in the last 168 hours. Coagulation Profile: No results for input(s): INR, PROTIME in the last 168 hours. Cardiac Enzymes: No results for input(s): CKTOTAL, CKMB, CKMBINDEX, TROPONINI in the last 168 hours. BNP (last 3 results) No results for input(s): PROBNP in the last 8760 hours. HbA1C: No results for input(s):  HGBA1C in the last 72 hours. CBG: No results for input(s): GLUCAP in the last 168 hours. Lipid Profile: No results for input(s): CHOL, HDL, LDLCALC, TRIG, CHOLHDL, LDLDIRECT in the last 72 hours. Thyroid Function Tests: No results for input(s): TSH, T4TOTAL, FREET4, T3FREE, THYROIDAB in the last 72 hours. Anemia Panel: No results for input(s): VITAMINB12, FOLATE, FERRITIN, TIBC, IRON, RETICCTPCT in the last 72 hours. Urine analysis:    Component Value Date/Time   COLORURINE STRAW (A) 07/10/2020 1444   APPEARANCEUR CLEAR 07/10/2020 1444   APPEARANCEUR CLEAR 03/23/2013 1621   LABSPEC 1.003 (L) 07/10/2020 1444   LABSPEC 1.021 03/23/2013 1621   PHURINE 7.0 07/10/2020 1444   GLUCOSEU NEGATIVE 07/10/2020 1444   GLUCOSEU see comment 03/23/2013 1621   HGBUR NEGATIVE 07/10/2020 1444   BILIRUBINUR NEGATIVE 07/10/2020 1444   BILIRUBINUR see comment 03/23/2013 1621   KETONESUR NEGATIVE 07/10/2020 1444   PROTEINUR NEGATIVE 07/10/2020 1444   UROBILINOGEN 1.0 09/29/2014 2116   NITRITE NEGATIVE 07/10/2020 1444   LEUKOCYTESUR NEGATIVE 07/10/2020 1444   LEUKOCYTESUR see comment 03/23/2013 1621   Sepsis Labs: @LABRCNTIP (procalcitonin:4,lacticidven:4)  ) Recent Results (from the past 240 hour(s))  C Difficile Quick Screen w PCR reflex     Status: None   Collection Time: 07/31/20  2:13 PM  Result Value Ref Range Status   C Diff antigen NEGATIVE NEGATIVE Final   C Diff toxin NEGATIVE NEGATIVE Final   C Diff interpretation No C. difficile detected.  Final    Comment: Performed at [facility]: [facility], [address], [place], 13/10/21 [facility]      Radiology Studies: No results found.   Scheduled Meds: . amoxicillin-clavulanate  1 tablet Oral Q12H  . diphenoxylate-atropine  5 mL Oral TID  . estradiol  1 mg Oral Daily  . feeding supplement  237 mL Oral BID BM  . fluconazole  200 mg Oral Daily  . hydrocortisone   Rectal TID  . mometasone-formoterol  2 puff Inhalation BID  . multivitamin  with minerals  1 tablet Oral Daily  . pantoprazole  40 mg Oral BID AC  . simethicone  80 mg Oral QID  . sodium chloride flush  3 mL Intravenous Q12H   Continuous Infusions: . lactated ringers 50 mL/hr at 08/04/20 2150     LOS: 26 days   Time Spent in minutes   30 minutes  Vander Kueker D.O. on 08/05/2020 at 11:14 AM  Between 7am to 7pm - Please see pager noted  on [email or web address]  After 7pm go to [email or web address]  And look for the night coverage person covering for me after hours  [facility] Office  [phone]

## 2020-08-06 DIAGNOSIS — F333 Major depressive disorder, recurrent, severe with psychotic symptoms: Secondary | ICD-10-CM | POA: Diagnosis not present

## 2020-08-06 DIAGNOSIS — K2970 Gastritis, unspecified, without bleeding: Secondary | ICD-10-CM | POA: Diagnosis not present

## 2020-08-06 DIAGNOSIS — K859 Acute pancreatitis without necrosis or infection, unspecified: Secondary | ICD-10-CM | POA: Diagnosis not present

## 2020-08-06 DIAGNOSIS — E876 Hypokalemia: Secondary | ICD-10-CM | POA: Diagnosis not present

## 2020-08-06 LAB — CBC
HCT: 31.7 % — ABNORMAL LOW (ref 36.0–46.0)
Hemoglobin: 10.2 g/dL — ABNORMAL LOW (ref 12.0–15.0)
MCH: 30.7 pg (ref 26.0–34.0)
MCHC: 32.2 g/dL (ref 30.0–36.0)
MCV: 95.5 fL (ref 80.0–100.0)
Platelets: 366 10*3/uL (ref 150–400)
RBC: 3.32 MIL/uL — ABNORMAL LOW (ref 3.87–5.11)
RDW: 14.5 % (ref 11.5–15.5)
WBC: 10.7 10*3/uL — ABNORMAL HIGH (ref 4.0–10.5)
nRBC: 0 % (ref 0.0–0.2)

## 2020-08-06 LAB — BASIC METABOLIC PANEL
Anion gap: 7 (ref 5–15)
BUN: <5 mg/dL — ABNORMAL LOW (ref 6–20)
CO2: 26 mmol/L (ref 22–32)
Calcium: 9 mg/dL (ref 8.9–10.3)
Chloride: 108 mmol/L (ref 98–111)
Creatinine, Ser: 0.64 mg/dL (ref 0.44–1.00)
GFR, Estimated: >60 mL/min (ref >60)
Glucose, Bld: 107 mg/dL — ABNORMAL HIGH (ref 70–99)
Potassium: 3.7 mmol/L (ref 3.5–5.1)
Sodium: 141 mmol/L (ref 135–145)

## 2020-08-06 MED ORDER — POTASSIUM CHLORIDE CRYS ER 20 MEQ PO TBCR
40.0000 meq | EXTENDED_RELEASE_TABLET | Freq: Once | ORAL | Status: AC
  Administered 2020-08-06: 40 meq via ORAL
  Filled 2020-08-06: qty 2

## 2020-08-06 MED ORDER — COLESTIPOL HCL 1 G PO TABS
1.0000 g | ORAL_TABLET | Freq: Two times a day (BID) | ORAL | Status: DC
  Administered 2020-08-06 – 2020-08-07 (×3): 1 g via ORAL
  Filled 2020-08-06 (×4): qty 1

## 2020-08-06 NOTE — Progress Notes (Signed)
PROGRESS NOTE    Tricia Brown  ZOX:[id number] DOB: 12-27-1975 DOA: 07/10/2020 PCP: Evelene Brown, [name], MD   Brief Narrative:  HPI on 07/10/2020 by Dr. Beola CordAlexander Melvin Tricia RosserShannon Brown is a 44 y.o. female with medical history significant of endometriosis status post diagnostic laparoscopy and hysterectomy, IBS-D, GERD, status post cholecystectomy who presents with abdominal pain.  Patient reports 4 days of constant epigastric and left upper quadrant abdominal pain.  She also reports associated nausea, vomiting, and diarrhea.  The pain is a constant 4 out of 10 with flares to 8 out of 10 with certain movements or pressure on her abdomen and following meals.  She tried Tums at home which did not help and she takes a daily PPI.  She noted that her pain started radiating to her right chest and back today and came into the ED for evaluation.  She denies fever, cough, shortness of breath.  She reports only weekend alcohol use of about 6 pack on occasion.  Interim history Patient admitted with acute pancreatitis with infected pseudocyst.  Gastroenterology consulted and appreciated.  Patient is improving slowly with pain control continues to be the issue. Assessment & Plan   Acute necrotizing pancreatitis with infected pseudocyst -CT on admission showed stranding around the pancreatic body and tail compatible with acute pancreatitis, adjacent fluid collections with the largest being 3.8 cm most compatible with acute fluid collection and developing pseudocyst. -MRCP did not show cholelithiasis -CT on 07/21/2020 showed a well organized cystic structure measuring 9.9 x 620 x 11.6 cm -Gastroenterology consulted and appreciated -Status post EUS showing diffuse yellow plaques found in the entire esophagus, and extrinsic compression noted in the posterior wall of the stomach, multiple cysts identified in the pancreatic body and pancreatic tail, AXIOS cystogastrostomy was performed.  Plastic double pigtail stents  placed due to decreased risk of new chroma closure of stent -CT 11/5 showed improvement of pseudocyst -Status post EUS 08/02/2020 with removal of AXIOS stent.  -Gastroenterology recommended 5 additional days of antibiotics -She initially was placed on Zosyn however transitioned to Augmentin on 11/2 -Continue Creon -Patient currently tolerating diet- but continues to have some pain after eating -Discussed with gastroenterology, Dr.Karki, patient will need repeat imaging in 4 to 8 weeks depending on her clinical course.  Continue Augmentin (stop date placed) -Patient has used 2 doses of IV Dilaudid in the past 24 hours. -Continue oxycodone 15 mg 4 hours as needed  Diarrhea -Initially started on vancomycin which was discontinued after colonoscopy -C. difficile ordered again by gastroenterology which was unremarkable -Status post flex sig on 07/29/20: Biopsy showed benign colon mucosa -Continue Imodium and Lomotil -Stools are now forming and watery stools are slowing down with thicker consistency -have scheduled Bentyl 3 times daily AC, and nightly instead of PRN -Colestid started today by gastroenterology  Candidal esophagitis -Continue Diflucan  Hypokalemia -Resolved with replacement, continue to monitor  Normocytic anemia -Hemoglobin stable, currently 9.7 -Continue to monitor CBC  Thrombocytosis -Likely reactive to infection -Improving, trending downward   DVT Prophylaxis SCDs  Code Status: Full  Family Communication: None at bedside  Disposition Plan:  Status is: Inpatient  Remains inpatient appropriate because:Ongoing active pain requiring inpatient pain management   Dispo: The patient is from: Home              Anticipated d/c is to: Home              Anticipated d/c date is: 1-2 days  Patient currently is not medically stable to d/c.  Consultants Gastroenterology  Procedures  EGD EUS x2 Flexible sigmoidoscopy  Antibiotics   Anti-infectives  (From admission, onward)   Start     Dose/Rate Route Frequency Ordered Stop   08/02/20 1200  amoxicillin-clavulanate (AUGMENTIN) 875-125 MG per tablet 1 tablet        1 tablet Oral Every 12 hours 08/02/20 1145 08/06/20 2359   07/29/20 1200  piperacillin-tazobactam (ZOSYN) IVPB 3.375 g  Status:  Discontinued        3.375 g 12.5 mL/hr over 240 Minutes Intravenous Every 8 hours 07/29/20 1101 08/02/20 1145   07/28/20 1400  Ampicillin-Sulbactam (UNASYN) 3 g in sodium chloride 0.9 % 100 mL IVPB  Status:  Discontinued        3 g 200 mL/hr over 30 Minutes Intravenous Every 6 hours 07/28/20 1052 07/29/20 1101   07/26/20 1030  vancomycin (VANCOCIN) 50 mg/mL oral solution 125 mg  Status:  Discontinued        125 mg Oral 4 times daily 07/26/20 0930 07/30/20 0935   07/25/20 1030  fluconazole (DIFLUCAN) tablet 200 mg        200 mg Oral Daily 07/25/20 0936 08/06/20 2359   07/22/20 2030  piperacillin-tazobactam (ZOSYN) IVPB 3.375 g  Status:  Discontinued        3.375 g 12.5 mL/hr over 240 Minutes Intravenous Every 8 hours 07/22/20 1944 07/28/20 1050   07/22/20 1400  piperacillin-tazobactam (ZOSYN) IVPB 3.375 g        3.375 g 100 mL/hr over 30 Minutes Intravenous 60 min pre-op 07/21/20 0951 07/22/20 1500      Subjective:   Carollee Herter Mcelhinny seen and examined today.  Feels pain is still there but is more tolerable today.  States that she is trying not to use the IV pain medications.  Continues to have loose bowel movements but states the consistency is between water and pudding.  Denies current chest pain or shortness of breath, dizziness or headache.    Objective:   Vitals:   08/05/20 0500 08/05/20 1354 08/05/20 2142 08/05/20 2215  BP: 129/86 136/69  124/82  Pulse: 76 88 88 78  Resp: 18 18 18 17   Temp: 98.1 F (36.7 C) 98 F (36.7 C)  98.3 F (36.8 C)  TempSrc: Oral Oral  Oral  SpO2: 100% 100% 100% 100%  Weight:      Height:        Intake/Output Summary (Last 24 hours) at 08/06/2020 1021 Last  data filed at 08/06/2020 08/08/2020 Gross per 24 hour  Intake 3672.9 ml  Output 5100 ml  Net -1427.1 ml   Filed Weights   08/02/20 0500 08/03/20 0500 08/04/20 0415  Weight: 60.7 kg 62.4 kg 63.4 kg   Exam  General: Well developed, well nourished, NAD, appears stated age  HEENT: NCAT, mucous membranes moist.   Cardiovascular: S1 S2 auscultated, RRR  Respiratory: Clear to auscultation bilaterally with equal chest rise  Abdomen: Soft, RUQ/epigastric TTP, nondistended, + bowel sounds  Extremities: warm dry without cyanosis clubbing or edema  Neuro: AAOx3, nonfocal  Psych: pleasant, appropriate mood and affect  Data Reviewed: I have personally reviewed following labs and imaging studies  CBC: Recent Labs  Lab 07/31/20 0452 08/02/20 0554 08/04/20 0309 08/06/20 0109  WBC 10.2 11.9* 12.2* 10.7*  HGB 9.9* 9.7* 9.7* 10.2*  HCT 31.5* 30.4* 30.8* 31.7*  MCV 96.9 95.0 96.9 95.5  PLT 698* 551* 434* 366   Basic Metabolic Panel: Recent Labs  Lab  07/31/20 0452 08/02/20 0554 08/04/20 0309 08/06/20 0109  NA 141 140 139 141  K 4.7 4.2 3.9 3.7  CL 107 107 107 108  CO2 25 24 24 26   GLUCOSE 118* 97 100* 107*  BUN <5* <5* <5* <5*  CREATININE 0.83 1.01* 0.86 0.64  CALCIUM 9.3 9.2 8.9 9.0   GFR: Estimated Creatinine Clearance: 87.3 mL/min (by C-G formula based on SCr of 0.64 mg/dL). Liver Function Tests: Recent Labs  Lab 07/31/20 0452 08/02/20 0554 08/04/20 0309  AST 15 15 15   ALT 23 19 17   ALKPHOS 197* 157* 134*  BILITOT 0.3 0.5 0.4  PROT 5.8* 5.9* 5.3*  ALBUMIN 2.6* 2.6* 2.4*   No results for input(s): LIPASE, AMYLASE in the last 168 hours. No results for input(s): AMMONIA in the last 168 hours. Coagulation Profile: No results for input(s): INR, PROTIME in the last 168 hours. Cardiac Enzymes: No results for input(s): CKTOTAL, CKMB, CKMBINDEX, TROPONINI in the last 168 hours. BNP (last 3 results) No results for input(s): PROBNP in the last 8760 hours. HbA1C: No  results for input(s): HGBA1C in the last 72 hours. CBG: No results for input(s): GLUCAP in the last 168 hours. Lipid Profile: No results for input(s): CHOL, HDL, LDLCALC, TRIG, CHOLHDL, LDLDIRECT in the last 72 hours. Thyroid Function Tests: No results for input(s): TSH, T4TOTAL, FREET4, T3FREE, THYROIDAB in the last 72 hours. Anemia Panel: No results for input(s): VITAMINB12, FOLATE, FERRITIN, TIBC, IRON, RETICCTPCT in the last 72 hours. Urine analysis:    Component Value Date/Time   COLORURINE STRAW (A) 07/10/2020 1444   APPEARANCEUR CLEAR 07/10/2020 1444   APPEARANCEUR CLEAR 03/23/2013 1621   LABSPEC 1.003 (L) 07/10/2020 1444   LABSPEC 1.021 03/23/2013 1621   PHURINE 7.0 07/10/2020 1444   GLUCOSEU NEGATIVE 07/10/2020 1444   GLUCOSEU see comment 03/23/2013 1621   HGBUR NEGATIVE 07/10/2020 1444   BILIRUBINUR NEGATIVE 07/10/2020 1444   BILIRUBINUR see comment 03/23/2013 1621   KETONESUR NEGATIVE 07/10/2020 1444   PROTEINUR NEGATIVE 07/10/2020 1444   UROBILINOGEN 1.0 09/29/2014 2116   NITRITE NEGATIVE 07/10/2020 1444   LEUKOCYTESUR NEGATIVE 07/10/2020 1444   LEUKOCYTESUR see comment 03/23/2013 1621   Sepsis Labs: @LABRCNTIP (procalcitonin:4,lacticidven:4)  ) Recent Results (from the past 240 hour(s))  C Difficile Quick Screen w PCR reflex     Status: None   Collection Time: 07/31/20  2:13 PM  Result Value Ref Range Status   C Diff antigen NEGATIVE NEGATIVE Final   C Diff toxin NEGATIVE NEGATIVE Final   C Diff interpretation No C. difficile detected.  Final    Comment: Performed at [facility], [address], [place], 13/10/21 [facility]      Radiology Studies: No results found.   Scheduled Meds: . amoxicillin-clavulanate  1 tablet Oral Q12H  . colestipol  1 g Oral BID  . dicyclomine  10 mg Oral TID AC & HS  . diphenoxylate-atropine  5 mL Oral TID  . estradiol  1 mg Oral Daily  . feeding supplement  237 mL Oral BID BM  . fluconazole  200 mg Oral Daily  .  hydrocortisone   Rectal TID  . mometasone-formoterol  2 puff Inhalation BID  . multivitamin with minerals  1 tablet Oral Daily  . pantoprazole  40 mg Oral BID AC  . simethicone  80 mg Oral QID  . sodium chloride flush  3 mL Intravenous Q12H   Continuous Infusions: . lactated ringers 50 mL/hr at 08/06/20 0624     LOS:  27 days   Time Spent in minutes   30 minutes  Amillion Scobee D.O. on 08/06/2020 at 10:21 AM  Between 7am to 7pm - Please see pager noted on [email or web address]  After 7pm go to [email or web address]  And look for the night coverage person covering for me after hours  [facility] Office  [phone]

## 2020-08-06 NOTE — Plan of Care (Signed)
Patient ambulating in the hall, education on bland diet discussed with the client. Patient had break through pain and Dilaudid given with relief.  Continues to have small amt of soft liquid yellow stool.  Problem: Education: Goal: Knowledge of General Education information will improve Description: Including pain rating scale, medication(s)/side effects and non-pharmacologic comfort measures Outcome: Progressing   Problem: Health Behavior/Discharge Planning: Goal: Ability to manage health-related needs will improve Outcome: Progressing   Problem: Clinical Measurements: Goal: Ability to maintain clinical measurements within normal limits will improve Outcome: Progressing Goal: Will remain free from infection Outcome: Progressing Goal: Diagnostic test results will improve Outcome: Progressing Goal: Respiratory complications will improve Outcome: Progressing Goal: Cardiovascular complication will be avoided Outcome: Progressing   Problem: Activity: Goal: Risk for activity intolerance will decrease Outcome: Progressing   Problem: Nutrition: Goal: Adequate nutrition will be maintained Outcome: Progressing   Problem: Coping: Goal: Level of anxiety will decrease Outcome: Progressing   Problem: Elimination: Goal: Will not experience complications related to bowel motility Outcome: Progressing Goal: Will not experience complications related to urinary retention Outcome: Progressing   Problem: Pain Managment: Goal: General experience of comfort will improve Outcome: Progressing   Problem: Safety: Goal: Ability to remain free from injury will improve Outcome: Progressing   Problem: Skin Integrity: Goal: Risk for impaired skin integrity will decrease Outcome: Progressing   Problem: Education: Goal: Required Educational Video(s) Outcome: Progressing   Problem: Clinical Measurements: Goal: Postoperative complications will be avoided or minimized Outcome: Progressing    Problem: Skin Integrity: Goal: Demonstration of wound healing without infection will improve Outcome: Progressing   Problem: Education: Goal: Knowledge of Pancreatitis treatment and prevention will improve Outcome: Progressing   Problem: Health Behavior/Discharge Planning: Goal: Ability to formulate a plan to maintain an alcohol-free life will improve Outcome: Progressing   Problem: Nutritional: Goal: Ability to achieve adequate nutritional intake will improve Outcome: Progressing   Problem: Clinical Measurements: Goal: Complications related to the disease process, condition or treatment will be avoided or minimized Outcome: Progressing

## 2020-08-06 NOTE — Progress Notes (Signed)
[facility] Progress Note  Tricia Brown 44 y.o. 06/01/1976  CC: Necrotizing pancreatitis  Subjective: Patient reports continued abdominal pain, somewhat improved as compared to yesterday.  She states she is trying to take less pain medication. She has had some nausea today but denies vomiting.  Continues to have diarrhea at least 3-4 times daily.  She states Bentyl has relieved lower abdominal cramping.  ROS : Review of Systems  Cardiovascular: Negative for chest pain and palpitations.  Gastrointestinal: Positive for abdominal pain, diarrhea and nausea. Negative for blood in stool, constipation, heartburn, melena and vomiting.   Objective: Vital signs in last 24 hours: Vitals:   08/05/20 2142 08/05/20 2215  BP:  124/82  Pulse: 88 78  Resp: 18 17  Temp:  98.3 F (36.8 C)  SpO2: 100% 100%    Physical Exam:  General:  Lethargic, oriented, cooperative, no distress  Head:  Normocephalic, without obvious abnormality, atraumatic  Eyes:  Anicteric sclera, EOMs intact   Lungs:   Clear to auscultation bilaterally, respirations unlabored  Heart:  Regular rate and rhythm, S1, S2 normal  Abdomen:   Soft, non-distended, mild to moderate epigastric tenderness, bowel sounds active all four quadrants, no peritoneal signs  Extremities: Extremities normal, atraumatic, no  edema  Pulses: 2+ and symmetric    Lab Results: Recent Labs    08/04/20 0309 08/06/20 0109  NA 139 141  K 3.9 3.7  CL 107 108  CO2 24 26  GLUCOSE 100* 107*  BUN <5* <5*  CREATININE 0.86 0.64  CALCIUM 8.9 9.0   Recent Labs    08/04/20 0309  AST 15  ALT 17  ALKPHOS 134*  BILITOT 0.4  PROT 5.3*  ALBUMIN 2.4*   Recent Labs    08/04/20 0309 08/06/20 0109  WBC 12.2* 10.7*  HGB 9.7* 10.2*  HCT 30.8* 31.7*  MCV 96.9 95.5  PLT 434* 366   No results for input(s): LABPROT, INR in the last 72 hours.    Assessment/Plan: Necrotizing pancreatitis s/p cystgastrostomy 11/1 with stent removal  11/12  Continue soft diet.  Continue Creon, Bentyl, and Protonix.  Start Colestipol BID.    [facility] will follow.  Edrick Kins PA-C 08/06/2020, 12:18 PM  Contact #  [phone]

## 2020-08-07 DIAGNOSIS — K859 Acute pancreatitis without necrosis or infection, unspecified: Secondary | ICD-10-CM | POA: Diagnosis not present

## 2020-08-07 LAB — COMPREHENSIVE METABOLIC PANEL
ALT: 14 U/L (ref 0–44)
AST: 11 U/L — ABNORMAL LOW (ref 15–41)
Albumin: 2.7 g/dL — ABNORMAL LOW (ref 3.5–5.0)
Alkaline Phosphatase: 134 U/L — ABNORMAL HIGH (ref 38–126)
Anion gap: 8 (ref 5–15)
BUN: <5 mg/dL — ABNORMAL LOW (ref 6–20)
CO2: 26 mmol/L (ref 22–32)
Calcium: 9.1 mg/dL (ref 8.9–10.3)
Chloride: 106 mmol/L (ref 98–111)
Creatinine, Ser: 0.67 mg/dL (ref 0.44–1.00)
GFR, Estimated: >60 mL/min (ref >60)
Glucose, Bld: 94 mg/dL (ref 70–99)
Potassium: 4.1 mmol/L (ref 3.5–5.1)
Sodium: 140 mmol/L (ref 135–145)
Total Bilirubin: 0.3 mg/dL (ref 0.3–1.2)
Total Protein: 5.8 g/dL — ABNORMAL LOW (ref 6.5–8.1)

## 2020-08-07 LAB — CBC
HCT: 32.6 % — ABNORMAL LOW (ref 36.0–46.0)
Hemoglobin: 10.4 g/dL — ABNORMAL LOW (ref 12.0–15.0)
MCH: 31 pg (ref 26.0–34.0)
MCHC: 31.9 g/dL (ref 30.0–36.0)
MCV: 97.3 fL (ref 80.0–100.0)
Platelets: 399 10*3/uL (ref 150–400)
RBC: 3.35 MIL/uL — ABNORMAL LOW (ref 3.87–5.11)
RDW: 14.5 % (ref 11.5–15.5)
WBC: 9 10*3/uL (ref 4.0–10.5)
nRBC: 0 % (ref 0.0–0.2)

## 2020-08-07 MED ORDER — PANCRELIPASE (LIP-PROT-AMYL) 36000-114000 UNITS PO CPEP: 72000.0000 [IU] | ORAL_CAPSULE | Freq: Every day | ORAL | 1 refills | Status: DC

## 2020-08-07 MED ORDER — DICYCLOMINE HCL 10 MG PO CAPS: 10.0000 mg | ORAL_CAPSULE | Freq: Four times a day (QID) | ORAL | 1 refills | Status: DC | PRN

## 2020-08-07 MED ORDER — ENSURE ENLIVE PO LIQD
237.0000 mL | Freq: Two times a day (BID) | ORAL | 12 refills | Status: DC
Start: 2020-08-07 — End: 2020-09-26

## 2020-08-07 MED ORDER — ONDANSETRON HCL 4 MG PO TABS: 4.0000 mg | ORAL_TABLET | Freq: Three times a day (TID) | ORAL | 0 refills | Status: DC | PRN

## 2020-08-07 MED ORDER — ACETAMINOPHEN 500 MG PO TABS: 1000.0000 mg | ORAL_TABLET | Freq: Three times a day (TID) | ORAL | Status: DC | PRN

## 2020-08-07 MED ORDER — COLESTIPOL HCL 1 G PO TABS
1.0000 g | ORAL_TABLET | Freq: Two times a day (BID) | ORAL | 1 refills | Status: DC
Start: 2020-08-07 — End: 2020-10-08

## 2020-08-07 MED ORDER — PANTOPRAZOLE SODIUM 40 MG PO TBEC
40.0000 mg | DELAYED_RELEASE_TABLET | Freq: Two times a day (BID) | ORAL | 1 refills | Status: DC
Start: 2020-08-07 — End: 2021-06-01

## 2020-08-07 MED ORDER — OXYCODONE HCL 15 MG PO TABS: 15.0000 mg | ORAL_TABLET | ORAL | 0 refills | Status: DC | PRN

## 2020-08-07 NOTE — Discharge Summary (Signed)
Physician Discharge Summary  Tricia Brown TIW:[id number] DOB: September 13, 1976  PCP: Tricia Croon, MD  Admitted from: Home Discharged to: Home  Admit date: 07/10/2020 Discharge date: 08/07/2020  Recommendations for Outpatient Follow-up:    Follow-up Information    Tricia Rigger, MD. Schedule an appointment as soon as possible for a visit.   Specialty: Gastroenterology Why: Please call MDs office to arrange a follow-up appointment. Contact information: [address] [place] [address] [phone]        Tricia Croon, MD. Schedule an appointment as soon as possible for a visit in 1 week(s).   Specialty: Family Medicine Why: To be seen with repeat labs (CBC & CMP). Contact information[facility] [place] [address] [phone]                Home Health: None    Equipment/Devices: None    Discharge Condition: Improved and stable   Code Status: Full Code Diet recommendation:  Discharge Diet Orders (From admission, onward)    Start     Ordered   08/07/20 0000  Diet - low sodium heart healthy       Comments: Soft, low-fat diet.   08/07/20 1247           Discharge Diagnoses:  Principal Problem:   Pancreatitis Active Problems:   MDD (major depressive disorder), recurrent episode, severe (HCC)   Opioid use disorder, severe, dependence (HCC)   Hypokalemia   Pseudocyst of pancreas   Gastritis and gastroduodenitis   Brief Summary: 44 year old female with history of endometriosis s/p diagnostic laparoscopy and hysterectomy, IBS-D, GERD, s/p cholecystectomy who presented to the hospital with 4 days history of epigastric and left upper quadrant abdominal pain with associated nausea, vomiting and diarrhea.  She then noted that the pain started radiating to her right chest and back.  She reported only weekend alcohol use of about 6 pack on occasion.  She was admitted for acute pancreatitis with infected pseudocyst.  [facility] was  consulted.  Assessment and plan:  Acute necrotizing pancreatitis with infected pseudocyst -CT on admission showed stranding around the pancreatic body and tail compatible with acute pancreatitis, adjacent fluid collections with the largest being 3.8 cm most compatible with acute fluid collection and developing pseudocyst. -MRCP did not show cholelithiasis -CT on 07/21/2020 showed a well organized cystic structure measuring 9.9 x 620 x 11.6 cm [facility] gastroenterology was consulted and assisted with management.  They followed up today as well, I discussed with them at bedside and they have cleared patient for discharge home. -Status post EUS showing diffuse yellow plaques found in the entire esophagus, and extrinsic compression noted in the posterior wall of the stomach, multiple cysts identified in the pancreatic body and pancreatic tail, AXIOS cystogastrostomy was performed.  Plastic double pigtail stents placed due to decreased risk of new chroma closure of stent -CT 11/5 showed improvement of pseudocyst -Status post EUS 08/02/2020 with removal of AXIOS stent.  -Patient has completed a course of antibiotics.  She was initially on IV Zosyn which was then followed with oral Augmentin.  As per discussion with GI today, no indication for further antibiotics. -Patient also completed a course of Diflucan. -As per GI signed off today, cleared for discharge home, at discharge, continue Creon 72,000u PO daily, Colestipol 1g PO twice daily, and Protonix 40 mg PO BID.  Continue Zofran 4mg  every 8 hours as needed.  Continue Bentyl 10 mg up to four times a day as needed. -Patient is tolerating  soft diet.  Her diarrhea/loose BMs have improved after colestipol.  She still has some abdominal pain towards the tail end of OxyIR dose but feels comfortable with the current dose.  She has been advised that she will only be prescribed a short course of OxyIR until close outpatient follow-up with her PCP.  I reviewed the  [place] PDMP and patient has not used opioids since May 2021 when she reported that she had a dog bite.  Also advised risks and precautions while taking these medications.  She verbalized understanding. -Prior hospitalist MD discussed with gastroenterology, Tricia Brown, patient will need repeat imaging in 4 to 8 weeks depending on her clinical course.  -[facility] office will arrange follow-up in the next 4 to 6 weeks.  Diarrhea -Initially started on vancomycin which was discontinued after colonoscopy -C. difficile ordered again by gastroenterology which was unremarkable -Status post flex sig on 07/29/20: Biopsy showed benign colon mucosa -Diarrhea improved after colestipol and GI recommends continuing same at discharge.  Candidal esophagitis -Completed a course of Diflucan.  Hypokalemia -Resolved with replacement.  Normocytic anemia -Hemoglobin stable, currently 9.7 -Outpatient follow-up.  Possibly due to chronic disease.  Thrombocytosis -Likely reactive to infection -Resolved   Consultations:  [facility]  Procedures: EGD EUS x2 Flexible sigmoidoscopy   Discharge Instructions  Discharge Instructions    Call MD for:  difficulty breathing, headache or visual disturbances   Complete by: As directed    Call MD for:  extreme fatigue   Complete by: As directed    Call MD for:  persistant dizziness or light-headedness   Complete by: As directed    Call MD for:  persistant nausea and vomiting   Complete by: As directed    Call MD for:  severe uncontrolled pain   Complete by: As directed    Call MD for:  temperature >100.4   Complete by: As directed    Diet - low sodium heart healthy   Complete by: As directed    Soft, low-fat diet.   Increase activity slowly   Complete by: As directed        Medication List    STOP taking these medications   ibuprofen 200 MG tablet Commonly known as: ADVIL   ondansetron 4 MG disintegrating tablet Commonly known as: Zofran ODT      TAKE these medications   acetaminophen 500 MG tablet Commonly known as: TYLENOL Take 2 tablets (1,000 mg total) by mouth every 8 (eight) hours as needed for mild pain or fever. What changed:   when to take this  reasons to take this   budesonide-formoterol 80-4.5 MCG/ACT inhaler Commonly known as: SYMBICORT Inhale 1 puff into the lungs daily.   colestipol 1 g tablet Commonly known as: COLESTID Take 1 tablet (1 g total) by mouth 2 (two) times daily.   dicyclomine 10 MG capsule Commonly known as: BENTYL Take 1 capsule (10 mg total) by mouth 4 (four) times daily as needed for spasms.   estradiol 1 MG tablet Commonly known as: ESTRACE Take 1 mg by mouth daily.   feeding supplement Liqd Take 237 mLs by mouth 2 (two) times daily between meals.   fexofenadine 180 MG tablet Commonly known as: ALLEGRA Take 180 mg by mouth daily.   lipase/protease/amylase 92426 UNITS Cpep capsule Commonly known as: CREON Take 2 capsules (72,000 Units total) by mouth daily.   MULTIVITAMIN ADULT PO Take 1 tablet by mouth daily.   ondansetron 4 MG tablet Commonly known as: ZOFRAN Take  1 tablet (4 mg total) by mouth every 8 (eight) hours as needed for nausea or vomiting. What changed: when to take this   oxyCODONE 15 MG immediate release tablet Commonly known as: ROXICODONE Take 1 tablet (15 mg total) by mouth every 4 (four) hours as needed for moderate pain or severe pain.   pantoprazole 40 MG tablet Commonly known as: PROTONIX Take 1 tablet (40 mg total) by mouth 2 (two) times daily before a meal. What changed: when to take this   ProAir HFA 108 (90 Base) MCG/ACT inhaler Generic drug: albuterol Inhale 1-2 puffs into the lungs as needed for wheezing or shortness of breath.      No Known Allergies    Procedures/Studies: CT ABDOMEN PELVIS W WO CONTRAST  Result Date: 07/22/2020 CLINICAL DATA:  Follow-up pancreatitis. EXAM: CT ABDOMEN AND PELVIS WITHOUT AND WITH CONTRAST TECHNIQUE:  Multidetector CT imaging of the abdomen and pelvis was performed following the standard protocol before and following the bolus administration of intravenous contrast. CONTRAST:  OMNIPAQUE IOHEXOL 300 MG/ML  SOLN COMPARISON:  07/15/2020 the. FINDINGS: Lower chest: Subsegmental atelectasis identified within the lung bases. Hepatobiliary: No focal liver abnormality. Status post cholecystectomy. No biliary ductal dilatation. Pancreas: Stable to improved appearance of peripancreatic inflammation. Stable cystic lesion within tail of pancreas measures 1.4 cm, image 42/11. Adjacent cyst projecting off the tail of pancreas is again noted measuring 1.4 cm, unchanged. Between the posterior wall of the stomach in tail of pancreas there is a cystic structure measuring 3.2 cm, image 33/11. Previously this measured 4.1 cm. The large, well-organized cystic structure within the lesser sac between the pancreas, stomach and spleen measures 9.9 x 6.8 by 11.6 cm, image 67/11. Previously this measured 8.3 x 6.5 by 10.4 cm. No new fluid collections identified. Spleen: There is a small perisplenic collection extending along the medial aspect of the spleen which measures approximately 0.7 cm in thickness, image 20/11. No signs of splenic infarct. Adrenals/Urinary Tract: Normal appearance of the adrenal glands. The kidneys are unremarkable. No mass or hydronephrosis identified. Urinary bladder is unremarkable. Stomach/Bowel: The stomach appears nondistended. No significant small bowel wall thickening, inflammation or distension. The scratch set there is mild wall thickening involving the sigmoid colon without surrounding inflammation. This is nonspecific and may reflect incomplete distention. Vascular/Lymphatic: No significant vascular findings are present. The portal vein, portal venous confluence and splenic vein remain patent. No enlarged abdominal or pelvic lymph nodes. Reproductive: Status post hysterectomy. No adnexal masses.  Other: Interval resolution pelvic ascites. Musculoskeletal: No acute or significant osseous findings. IMPRESSION: 1. Stable to improved appearance of peripancreatic inflammation. 2. There is been interval increase in size of large, well-organized pseudo cyst within the lesser sac which extends along the posterior wall of the stomach. The smaller pseudocysts within and around the tail of pancreas are unchanged. 3. Interval resolution of pelvic ascites. 4. Mild wall thickening involving the sigmoid colon without surrounding inflammation. This is nonspecific and may reflect incomplete distention. Correlate for any clinical signs or symptoms of colitis. Electronically Signed   By: Signa Kell M.D.   On: 07/22/2020 05:30   CT ABDOMEN PELVIS W WO CONTRAST  Result Date: 07/15/2020 CLINICAL DATA:  Follow-up pancreatitis. EXAM: CT ABDOMEN AND PELVIS WITHOUT AND WITH CONTRAST TECHNIQUE: Multidetector CT imaging of the abdomen and pelvis was performed following the standard protocol before and following the bolus administration of intravenous contrast. CONTRAST:  OMNIPAQUE IOHEXOL 300 MG/ML  SOLN COMPARISON:  CT scan 07/10/2020 and MRI  07/11/2020 FINDINGS: Lower chest: The lung bases are clear of an acute process. No pleural effusions or pulmonary infiltrates. Hepatobiliary: No hepatic lesions or intrahepatic biliary dilatation. The portal and hepatic veins are patent. The gallbladder is surgically absent. No common bile duct dilatation. Pancreas: Mild residual inflammatory changes involving the pancreas. Interval organized slight rim enhancing fluid collections in and around the pancreas. There is a 12 mm lesion in the tail region the pancreas. There is a 14 mm cyst projecting off the ventral aspect of the pancreas on image 31/3. 2.8 cm cystic lesion appears to be in the posterior wall of the stomach and creates some mass effect on the lumen. This may communicate with a larger more complex fluid collection in  the lesser sac and also likely in the posterior wall the stomach. This collection measures 4.5 cm. 4.7 cm organized cystic structure in the lesser sac between the pancreas, the stomach and the spleen. More inferiorly in the lesser sac is a 8.7 x 6.3 cm organized cyst/pseudocyst which has significant mass effect on the body of the stomach. The pancreatic duct is not dilated. The splenic and portal veins are patent. Spleen: Upper limits of normal in size. No focal lesions. Small accessory spleen noted. Adrenals/Urinary Tract: Adrenal glands and kidneys are unremarkable and stable. Stomach/Bowel: There is significant mass effect on the stomach by the pseudocysts. There is also moderate inflammation involving the entire stomach including the GE junction. The duodenum is unremarkable. The small bowel and colon are unremarkable. Vascular/Lymphatic: The aorta and branch vessels are patent. The major venous structures are patent. Reproductive: The uterus and ovaries are surgically absent. Other: Moderate amount of free pelvic fluid, increased since the prior study. Musculoskeletal: No significant bony findings. IMPRESSION: 1. Interval development of several organized cystic structures with thin rim of enhancement consistent with pseudocysts. Significant mass effect on the stomach by the pseudocysts. 2. Improved appearance of the pancreas. Regressing inflammatory changes and no evidence of pancreatic necrosis. 3. Moderate inflammation involving the entire stomach including the GE junction. 4. Moderate amount of free pelvic fluid, increased since the prior study. 5. Status post cholecystectomy. No biliary dilatation. Electronically Signed   By: Rudie MeyerP.  Gallerani M.D.   On: 07/15/2020 20:16   DG Abd 1 View  Result Date: 07/18/2020 CLINICAL DATA:  Feeding tube placement EXAM: ABDOMEN - 1 VIEW COMPARISON:  None. FLUOROSCOPY TIME:  0 minutes 42 seconds; 1 acquired image FINDINGS: Feeding tube tip is in the third portion of the  duodenum. Contrast injection confirms placement. Visualized bowel gas pattern unremarkable. No bowel obstruction or free air evident on limited supine examination. IMPRESSION: Feeding tube tip in third portion of duodenum. Electronically Signed   By: Bretta BangWilliam  Woodruff III M.D.   On: 07/18/2020 13:02   CT ABDOMEN PELVIS W CONTRAST  Result Date: 07/26/2020 CLINICAL DATA:  History of necrotizing pancreatitis with pseudocyst formation and prior drainage EXAM: CT ABDOMEN AND PELVIS WITH CONTRAST TECHNIQUE: Multidetector CT imaging of the abdomen and pelvis was performed using the standard protocol following bolus administration of intravenous contrast. CONTRAST:  100mL OMNIPAQUE IOHEXOL 350 MG/ML SOLN COMPARISON:  07/21/2020 FINDINGS: Lower chest: No acute abnormality. Hepatobiliary: Mild fatty infiltration of the liver is noted. The gallbladder has been surgically removed. Pancreas: Pancreas is well visualized. There are again noted several cystic lesions within the pancreas the largest of which measures approximately 12 mm in greatest dimension. This has decreased slightly in the interval from the prior exam at which time it measured  almost 14 mm. Stable appearing 14 mm hypodensity is noted posterior to the stomach best seen on image number 16 of series 2. The larger retrogastric fluid collection seen on the prior exam appears to have resolved. The previously seen large infra pancreatic fluid collection now demonstrates evidence of cysto gastrostomy with multiple dual pigtail stents extending through the metallic gastrostomy stent the larger drained collection has decompressed significantly with no significant residual fluid within. Mild wall thickening related to the cyst structure is seen surrounding the pigtail stents best noted on image number 37 of series 2. No new pancreatic pseudocyst is noted. Spleen: Normal in size without focal abnormality. Adrenals/Urinary Tract: Adrenal glands are within normal limits.  Kidneys demonstrate a normal enhancement pattern bilaterally. No renal calculi or obstructive changes are seen. The bladder is well distended. Stomach/Bowel: Contrast material is noted throughout the colon. Scattered diverticular changes noted. No obstructive or inflammatory changes of the colon are seen. Some mild wall thickening is noted reactive in nature in the distal transverse colon related to the known previously seen pancreatic pseudocyst. The appendix is within normal limits. Small bowel is within normal limits. Stomach again demonstrates the previously described cysto gastrostomy stent with 2 pigtail catheter is extending from the pseudocyst into the stomach lumen. The pseudocyst is nearly completely decompressed. Vascular/Lymphatic: No significant vascular findings are present. No enlarged abdominal or pelvic lymph nodes. Reproductive: Status post hysterectomy. No adnexal masses. Other: No free fluid is noted. Musculoskeletal: No acute or significant osseous findings. IMPRESSION: Changes consistent with the known history of prior pancreatitis although no acute inflammatory changes are noted at this time. Multiple cystic lesions are noted associated with the tail of the pancreas all of which have decreased in size when compared with the prior exam. The largest of these shows changes of cysto gastrostomy with 2 pigtail stents extending from the stomach into the cyst. This has nearly completely decompressed the pseudocyst. Mild wall thickening within the distal transverse colon related to localized inflammatory change related to the pancreatic pseudocyst. This is stable from the prior exam. Fatty liver. Electronically Signed   By: Alcide Clever M.D.   On: 07/26/2020 18:11   CT ABDOMEN PELVIS W CONTRAST  Result Date: 07/10/2020 CLINICAL DATA:  Abdominal distension, swelling EXAM: CT ABDOMEN AND PELVIS WITH CONTRAST TECHNIQUE: Multidetector CT imaging of the abdomen and pelvis was performed using the  standard protocol following bolus administration of intravenous contrast. CONTRAST:  OMNIPAQUE IOHEXOL 300 MG/ML  SOLN COMPARISON:  05/12/2019 FINDINGS: Lower chest: Airspace opacity peripherally in the right lower lobe/lung base concerning for possible pneumonia. Left lung base clear. No effusions. Hepatobiliary: No focal hepatic abnormality.  Prior cholecystectomy. Pancreas: There is stranding noted around the pancreatic body and tail. Surrounding small fluid collections within the lesser sac and adjacent to the greater curvature of the stomach, the largest measuring 3.8 cm. Findings most compatible with acute pancreatitis. Spleen: No focal abnormality.  Normal size. Adrenals/Urinary Tract: No adrenal abnormality. No focal renal abnormality. No stones or hydronephrosis. Urinary bladder is unremarkable. Stomach/Bowel: There is wall thickening along the greater curvature adjacent to the inflammation from the pancreas and lesser sac, likely secondarily inflamed. Vascular/Lymphatic: No evidence of aneurysm or adenopathy. Reproductive: Prior hysterectomy.  No adnexal masses. Other: Small to moderate free fluid in the cul-de-sac.  No free air. Musculoskeletal: No acute bony abnormality. IMPRESSION: Stranding noted around the pancreatic body and tail compatible with acute pancreatitis. Adjacent fluid collections, the largest 3.8 cm most compatible with acute  fluid collections and developing pseudocysts. Secondary reactive inflammation of the adjacent gastric wall. Small to moderate free fluid in the pelvis. Electronically Signed   By: Charlett Nose M.D.   On: 07/10/2020 21:02   DG Chest Port 1 View  Result Date: 07/22/2020 CLINICAL DATA:  Pancreatitis EXAM: PORTABLE CHEST 1 VIEW COMPARISON:  07/21/2020 FINDINGS: Single frontal view of the chest demonstrates an unremarkable cardiac silhouette. No airspace disease, effusion, or pneumothorax. No acute bony abnormalities. IMPRESSION: 1. No acute intrathoracic  process. Electronically Signed   By: Sharlet Salina M.D.   On: 07/22/2020 19:18   DG Abd Acute W/Chest  Result Date: 07/10/2020 CLINICAL DATA:  Worsening left upper quadrant pain. EXAM: DG ABDOMEN ACUTE WITH 1 VIEW CHEST COMPARISON:  10/11/2014 FINDINGS: There is no evidence of dilated bowel loops or free intraperitoneal air. Surgical clips in the gallbladder fossa. No radiopaque calculi or other significant radiographic abnormality is seen. Heart size and mediastinal contours are within normal limits. Both lungs are clear. IMPRESSION: Negative abdominal radiographs.  No acute cardiopulmonary disease. Electronically Signed   By: Marlan Palau M.D.   On: 07/10/2020 11:36   DG Abd Portable 2V  Result Date: 07/22/2020 CLINICAL DATA:  Pancreatitis, cystogastrostomy, pain EXAM: PORTABLE ABDOMEN - 2 VIEW COMPARISON:  None. FINDINGS: Supine and left lateral decubitus views of the abdomen and pelvis are obtained. Postsurgical changes are seen within the left mid abdomen compatible with pseudocyst drainage. No masses or abnormal calcifications. No bowel obstruction or ileus. No free gas. No acute bony abnormalities. IMPRESSION: 1. Postsurgical changes from internal decompression of pancreatic pseudocyst. 2. No evidence of bowel obstruction or perforation. Electronically Signed   By: Sharlet Salina M.D.   On: 07/22/2020 19:20   MR ABDOMEN MRCP W WO CONTAST  Result Date: 07/12/2020 CLINICAL DATA:  Severe acute pancreatitis. EXAM: MRI ABDOMEN WITHOUT AND WITH CONTRAST (INCLUDING MRCP) TECHNIQUE: Multiplanar multisequence MR imaging of the abdomen was performed both before and after the administration of intravenous contrast. Heavily T2-weighted images of the biliary and pancreatic ducts were obtained, and three-dimensional MRCP images were rendered by post processing. CONTRAST:  6mL GADAVIST GADOBUTROL 1 MMOL/ML IV SOLN COMPARISON:  Abdominopelvic CT 07/10/2020, 05/12/2019 and 10/11/2014. FINDINGS: Lower chest:  Focal airspace disease peripherally at the right lung base is unchanged from recent CT and may reflect pneumonia or atelectasis. No significant pleural or pericardial effusion. Hepatobiliary: The liver is normal in signal and demonstrates no focal abnormality or abnormal enhancement. There is no significant biliary dilatation post cholecystectomy. No evidence of choledocholithiasis. Pancreas: As demonstrated on recent CT, the pancreas is edematous consistent with acute interstitial edematous pancreatitis. No evidence of pancreatic hemorrhage or necrosis. The pancreas enhances homogeneously following contrast. The pancreatic duct is not dilated. There are diffuse surrounding inflammatory changes with fluid collections in the lesser sac, measuring up to 3.3 x 1.7 cm on image 15/5. There is an additional component draping around the splenic vessels, measuring up to 3.4 x 1.8 cm. Less well-defined fluid extends anteriorly along the posterior aspect of the stomach. These fluid collections demonstrate some peripheral enhancement following contrast and have not significantly changed compared with the recent CT. Spleen: Normal in size without focal abnormality. Adrenals/Urinary Tract: Both adrenal glands appear normal. Both kidneys appear normal. No evidence of hydronephrosis or abnormal enhancement. Stomach/Bowel: Posterior gastric wall thickening similar to prior CT, attributed to acute pancreatitis. No significant bowel distension, wall thickening or surrounding inflammatory change. Vascular/Lymphatic: There are no enlarged abdominal lymph nodes. No significant  vascular findings. The portal, superior mesenteric and splenic veins are patent. Other: As above, peripancreatic inflammatory changes tracking asymmetrically into the left mesentery. Small amount of ascites. Musculoskeletal: No acute or significant osseous findings. IMPRESSION: 1. No significant change in findings consistent with acute interstitial edematous  pancreatitis. No evidence of pancreatic hemorrhage or necrosis. 2. Stable peripherally enhancing fluid collections, primarily in the lesser sac, consistent with acute peripancreatic fluid collections. 3. No biliary dilatation post cholecystectomy. No evidence of choledocholithiasis. 4. Stable focal airspace disease peripherally at the right lung base, likely pneumonia or atelectasis. Electronically Signed   By: Carey Bullocks M.D.   On: 07/12/2020 08:00      Subjective: Patient interviewed and examined along with [facility] PA in room.  Patient reports that her diarrhea has significantly improved.  She is tolerating soft diet.  Her pain is reasonably controlled.  She still reports ongoing pain towards the tail end of her OxyIR dose but is comfortable with current regimen.  On review of her med rec, she has been consistently using OxyIR 15 mg dose.  Also states that she has been walking independently and steadily in the room and in the halls.  She is anxious to DC home today.  Discharge Exam:  Vitals:   08/06/20 2054 08/06/20 2055 08/07/20 0500 08/07/20 0500  BP: 120/78   132/87  Pulse: 72 98  69  Resp: 18 18  18   Temp: (!) 97.4 F (36.3 C)   98 F (36.7 C)  TempSrc: Oral   Oral  SpO2: 100% 100%  100%  Weight:   63.6 kg   Height:        General: Pleasant young female, moderately built and nourished sitting up comfortably in chair without distress. Cardiovascular: S1 & S2 heard, RRR, S1/S2 +. No murmurs, rubs, gallops or clicks. No JVD or pedal edema. Respiratory: Clear to auscultation without wheezing, rhonchi or crackles. No increased work of breathing. Abdominal:  Non distended, soft.  Mild periumbilical tenderness without rigidity, guarding or rebound. No organomegaly or masses appreciated. Normal bowel sounds heard. CNS: Alert and oriented. No focal deficits. Extremities: no edema, no cyanosis    The results of significant diagnostics from this hospitalization (including imaging,  microbiology, ancillary and laboratory) are listed below for reference.     Microbiology: Recent Results (from the past 240 hour(s))  C Difficile Quick Screen w PCR reflex     Status: None   Collection Time: 07/31/20  2:13 PM  Result Value Ref Range Status   C Diff antigen NEGATIVE NEGATIVE Final   C Diff toxin NEGATIVE NEGATIVE Final   C Diff interpretation No C. difficile detected.  Final    Comment: Performed at [facility], [address], [place], [place] [address]     Labs: CBC: Recent Labs  Lab 08/02/20 0554 08/04/20 0309 08/06/20 0109 08/07/20 0400  WBC 11.9* 12.2* 10.7* 9.0  HGB 9.7* 9.7* 10.2* 10.4*  HCT 30.4* 30.8* 31.7* 32.6*  MCV 95.0 96.9 95.5 97.3  PLT 551* 434* 366 399    Basic Metabolic Panel: Recent Labs  Lab 08/02/20 0554 08/04/20 0309 08/06/20 0109 08/07/20 0400  NA 140 139 141 140  K 4.2 3.9 3.7 4.1  CL 107 107 108 106  CO2 24 24 26 26   GLUCOSE 97 100* 107* 94  BUN <5* <5* <5* <5*  CREATININE 1.01* 0.86 0.64 0.67  CALCIUM 9.2 8.9 9.0 9.1    Liver Function Tests: Recent Labs  Lab 08/02/20  0554 08/04/20 0309 08/07/20 0400  AST 15 15 11*  ALT 19 17 14   ALKPHOS 157* 134* 134*  BILITOT 0.5 0.4 0.3  PROT 5.9* 5.3* 5.8*  ALBUMIN 2.6* 2.4* 2.7*    Urinalysis    Component Value Date/Time   COLORURINE STRAW (A) 07/10/2020 1444   APPEARANCEUR CLEAR 07/10/2020 1444   APPEARANCEUR CLEAR 03/23/2013 1621   LABSPEC 1.003 (L) 07/10/2020 1444   LABSPEC 1.021 03/23/2013 1621   PHURINE 7.0 07/10/2020 1444   GLUCOSEU NEGATIVE 07/10/2020 1444   GLUCOSEU see comment 03/23/2013 1621   HGBUR NEGATIVE 07/10/2020 1444   BILIRUBINUR NEGATIVE 07/10/2020 1444   BILIRUBINUR see comment 03/23/2013 1621   KETONESUR NEGATIVE 07/10/2020 1444   PROTEINUR NEGATIVE 07/10/2020 1444   UROBILINOGEN 1.0 09/29/2014 2116   NITRITE NEGATIVE 07/10/2020 1444   LEUKOCYTESUR NEGATIVE 07/10/2020 1444   LEUKOCYTESUR see comment 03/23/2013 1621      Time  coordinating discharge: 45 minutes  SIGNED:  Marcellus Scott, MD, FACP, [facility]. [facility]  To contact the attending provider between 7A-7P or the covering provider during after hours 7P-7A, please log into the web site [email or web address] and access using universal [place] password for that web site. If you do not have the password, please call the hospital operator.

## 2020-08-07 NOTE — Progress Notes (Signed)
[facility] Gastroenterology Progress Note  Tricia Brown 44 y.o. 1975-10-03  CC: Necrotizing pancreatitis  Subjective: Patient reports feeling better today.  She reports more formed bowel movements since initiating Colestipol yesterday.  She notes fairly good pain control with oral pain regimen.  She is tolerating a soft diet.  Denies nausea or vomiting.  She is looking forward to discharge.    ROS : Review of Systems  Cardiovascular: Negative for chest pain and palpitations.  Gastrointestinal: Positive for abdominal pain. Negative for blood in stool, constipation, diarrhea, heartburn, melena, nausea and vomiting.   Objective: Vital signs in last 24 hours: Vitals:   08/06/20 2055 08/07/20 0500  BP:  132/87  Pulse: 98 69  Resp: 18 18  Temp:  98 F (36.7 C)  SpO2: 100% 100%    Physical Exam:  General:  Alert, oriented, cooperative, no distress, thin  Head:  Normocephalic, without obvious abnormality, atraumatic  Eyes:  Anicteric sclera, EOMs intact   Lungs:   Clear to auscultation bilaterally, respirations unlabored  Heart:  Regular rate and rhythm, S1, S2 normal  Abdomen:   Soft, non-distended, mild and LUQ tenderness, bowel sounds active all four quadrants, no peritoneal signs  Extremities: Extremities normal, atraumatic, no  edema  Pulses: 2+ and symmetric    Lab Results: Recent Labs    08/06/20 0109 08/07/20 0400  NA 141 140  K 3.7 4.1  CL 108 106  CO2 26 26  GLUCOSE 107* 94  BUN <5* <5*  CREATININE 0.64 0.67  CALCIUM 9.0 9.1   Recent Labs    08/07/20 0400  AST 11*  ALT 14  ALKPHOS 134*  BILITOT 0.3  PROT 5.8*  ALBUMIN 2.7*   Recent Labs    08/06/20 0109 08/07/20 0400  WBC 10.7* 9.0  HGB 10.2* 10.4*  HCT 31.7* 32.6*  MCV 95.5 97.3  PLT 366 399   No results for input(s): LABPROT, INR in the last 72 hours.    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  At  discharge, continue Creon 72,000u PO daily, Colestipol 1g PO twice daily, and Protonix 40 mg PO BID.  Continue Zofran 4mg  every 6 hours as needed.  Continue Bentyl 10 mg up to four times a day as needed.  We will arrange follow-up in our office within the next 4-6 weeks.  Patient also has our contact information if she needs to reach .  [facility] will sign off. Please contact us if we can be of any further assistance in this hospital stay.  [place] PA-C 08/07/2020, 10:28 AM  Contact #  [phone]

## 2020-08-07 NOTE — Discharge Instructions (Signed)

## 2020-08-07 NOTE — Progress Notes (Signed)
Patient aggreable with discharge. Discharge instructions provided with patient at bedside. Expressed understanding. Discharge teaching successful with teachback method. All lines discontinued.  Patient left floor via ambulatory method accompanied by mother. No further complaints or concerns.

## 2020-08-15 ENCOUNTER — Other Ambulatory Visit: Payer: Self-pay

## 2020-08-15 ENCOUNTER — Emergency Department (HOSPITAL_COMMUNITY): Payer: Medicaid Other

## 2020-08-15 ENCOUNTER — Encounter (HOSPITAL_COMMUNITY): Payer: Self-pay

## 2020-08-15 ENCOUNTER — Inpatient Hospital Stay (HOSPITAL_COMMUNITY)
Admission: EM | Admit: 2020-08-15 | Discharge: 2020-08-28 | DRG: 439 | Disposition: A | Discharge status: Home Health | Payer: Medicaid Other | Attending: Student | Admitting: Student

## 2020-08-15 DIAGNOSIS — K8502 Idiopathic acute pancreatitis with infected necrosis: Secondary | ICD-10-CM | POA: Diagnosis present

## 2020-08-15 DIAGNOSIS — F1120 Opioid dependence, uncomplicated: Secondary | ICD-10-CM | POA: Diagnosis present

## 2020-08-15 DIAGNOSIS — N809 Endometriosis, unspecified: Secondary | ICD-10-CM | POA: Diagnosis present

## 2020-08-15 DIAGNOSIS — Z20822 Contact with and (suspected) exposure to covid-19: Secondary | ICD-10-CM | POA: Diagnosis present

## 2020-08-15 DIAGNOSIS — R1013 Epigastric pain: Secondary | ICD-10-CM | POA: Diagnosis not present

## 2020-08-15 DIAGNOSIS — Z87410 Personal history of cervical dysplasia: Secondary | ICD-10-CM | POA: Diagnosis not present

## 2020-08-15 DIAGNOSIS — R7401 Elevation of levels of liver transaminase levels: Secondary | ICD-10-CM | POA: Diagnosis present

## 2020-08-15 DIAGNOSIS — N179 Acute kidney failure, unspecified: Secondary | ICD-10-CM | POA: Diagnosis present

## 2020-08-15 DIAGNOSIS — E860 Dehydration: Secondary | ICD-10-CM | POA: Diagnosis present

## 2020-08-15 DIAGNOSIS — Z7951 Long term (current) use of inhaled steroids: Secondary | ICD-10-CM | POA: Diagnosis not present

## 2020-08-15 DIAGNOSIS — K863 Pseudocyst of pancreas: Secondary | ICD-10-CM | POA: Diagnosis present

## 2020-08-15 DIAGNOSIS — H9202 Otalgia, left ear: Secondary | ICD-10-CM | POA: Diagnosis not present

## 2020-08-15 DIAGNOSIS — K859 Acute pancreatitis without necrosis or infection, unspecified: Secondary | ICD-10-CM

## 2020-08-15 DIAGNOSIS — K219 Gastro-esophageal reflux disease without esophagitis: Secondary | ICD-10-CM | POA: Diagnosis present

## 2020-08-15 DIAGNOSIS — D649 Anemia, unspecified: Secondary | ICD-10-CM | POA: Diagnosis not present

## 2020-08-15 DIAGNOSIS — E876 Hypokalemia: Secondary | ICD-10-CM | POA: Diagnosis present

## 2020-08-15 DIAGNOSIS — K8591 Acute pancreatitis with uninfected necrosis, unspecified: Secondary | ICD-10-CM | POA: Diagnosis not present

## 2020-08-15 DIAGNOSIS — Z90722 Acquired absence of ovaries, bilateral: Secondary | ICD-10-CM | POA: Diagnosis not present

## 2020-08-15 DIAGNOSIS — E8809 Other disorders of plasma-protein metabolism, not elsewhere classified: Secondary | ICD-10-CM | POA: Diagnosis present

## 2020-08-15 DIAGNOSIS — K529 Noninfective gastroenteritis and colitis, unspecified: Secondary | ICD-10-CM | POA: Diagnosis present

## 2020-08-15 DIAGNOSIS — D509 Iron deficiency anemia, unspecified: Secondary | ICD-10-CM | POA: Diagnosis present

## 2020-08-15 DIAGNOSIS — Z789 Other specified health status: Secondary | ICD-10-CM

## 2020-08-15 DIAGNOSIS — R109 Unspecified abdominal pain: Secondary | ICD-10-CM | POA: Diagnosis present

## 2020-08-15 DIAGNOSIS — Z833 Family history of diabetes mellitus: Secondary | ICD-10-CM | POA: Diagnosis not present

## 2020-08-15 DIAGNOSIS — Z9049 Acquired absence of other specified parts of digestive tract: Secondary | ICD-10-CM

## 2020-08-15 DIAGNOSIS — Z931 Gastrostomy status: Secondary | ICD-10-CM

## 2020-08-15 DIAGNOSIS — R1084 Generalized abdominal pain: Secondary | ICD-10-CM

## 2020-08-15 DIAGNOSIS — Z8659 Personal history of other mental and behavioral disorders: Secondary | ICD-10-CM | POA: Diagnosis not present

## 2020-08-15 DIAGNOSIS — Z90710 Acquired absence of both cervix and uterus: Secondary | ICD-10-CM | POA: Diagnosis not present

## 2020-08-15 DIAGNOSIS — Z79899 Other long term (current) drug therapy: Secondary | ICD-10-CM | POA: Diagnosis not present

## 2020-08-15 DIAGNOSIS — G8929 Other chronic pain: Secondary | ICD-10-CM | POA: Diagnosis present

## 2020-08-15 DIAGNOSIS — F17210 Nicotine dependence, cigarettes, uncomplicated: Secondary | ICD-10-CM | POA: Diagnosis present

## 2020-08-15 DIAGNOSIS — Z8614 Personal history of Methicillin resistant Staphylococcus aureus infection: Secondary | ICD-10-CM

## 2020-08-15 LAB — CBC
HCT: 37.5 % (ref 36.0–46.0)
HCT: 35.4 % — ABNORMAL LOW (ref 36.0–46.0)
Hemoglobin: 11.6 g/dL — ABNORMAL LOW (ref 12.0–15.0)
Hemoglobin: 11.1 g/dL — ABNORMAL LOW (ref 12.0–15.0)
MCH: 29.7 pg (ref 26.0–34.0)
MCH: 30.2 pg (ref 26.0–34.0)
MCHC: 30.9 g/dL (ref 30.0–36.0)
MCHC: 31.4 g/dL (ref 30.0–36.0)
MCV: 96.2 fL (ref 80.0–100.0)
MCV: 96.5 fL (ref 80.0–100.0)
Platelets: 443 10*3/uL — ABNORMAL HIGH (ref 150–400)
Platelets: 408 10*3/uL — ABNORMAL HIGH (ref 150–400)
RBC: 3.9 MIL/uL (ref 3.87–5.11)
RBC: 3.67 MIL/uL — ABNORMAL LOW (ref 3.87–5.11)
RDW: 14.6 % (ref 11.5–15.5)
RDW: 14.6 % (ref 11.5–15.5)
WBC: 14 10*3/uL — ABNORMAL HIGH (ref 4.0–10.5)
WBC: 13.3 10*3/uL — ABNORMAL HIGH (ref 4.0–10.5)
nRBC: 0 % (ref 0.0–0.2)
nRBC: 0 % (ref 0.0–0.2)

## 2020-08-15 LAB — CREATININE, SERUM
Creatinine, Ser: 1 mg/dL (ref 0.44–1.00)
GFR, Estimated: >60 mL/min (ref >60)

## 2020-08-15 LAB — URINALYSIS, ROUTINE W REFLEX MICROSCOPIC
Bilirubin Urine: NEGATIVE
Glucose, UA: NEGATIVE mg/dL
Hgb urine dipstick: NEGATIVE
Ketones, ur: NEGATIVE mg/dL
Leukocytes,Ua: NEGATIVE
Nitrite: NEGATIVE
Protein, ur: NEGATIVE mg/dL
Specific Gravity, Urine: 1.009 (ref 1.005–1.030)
pH: 6 (ref 5.0–8.0)

## 2020-08-15 LAB — LIPASE, BLOOD: Lipase: 52 U/L — ABNORMAL HIGH (ref 11–51)

## 2020-08-15 LAB — COMPREHENSIVE METABOLIC PANEL
ALT: 73 U/L — ABNORMAL HIGH (ref 0–44)
AST: 53 U/L — ABNORMAL HIGH (ref 15–41)
Albumin: 2.9 g/dL — ABNORMAL LOW (ref 3.5–5.0)
Alkaline Phosphatase: 159 U/L — ABNORMAL HIGH (ref 38–126)
Anion gap: 13 (ref 5–15)
BUN: 12 mg/dL (ref 6–20)
CO2: 15 mmol/L — ABNORMAL LOW (ref 22–32)
Calcium: 8.7 mg/dL — ABNORMAL LOW (ref 8.9–10.3)
Chloride: 104 mmol/L (ref 98–111)
Creatinine, Ser: 1.11 mg/dL — ABNORMAL HIGH (ref 0.44–1.00)
GFR, Estimated: >60 mL/min (ref >60)
Glucose, Bld: 112 mg/dL — ABNORMAL HIGH (ref 70–99)
Potassium: 3.3 mmol/L — ABNORMAL LOW (ref 3.5–5.1)
Sodium: 132 mmol/L — ABNORMAL LOW (ref 135–145)
Total Bilirubin: 0.6 mg/dL (ref 0.3–1.2)
Total Protein: 6.2 g/dL — ABNORMAL LOW (ref 6.5–8.1)

## 2020-08-15 LAB — HEMOGLOBIN A1C
Hgb A1c MFr Bld: 4.6 % — ABNORMAL LOW (ref 4.8–5.6)
Mean Plasma Glucose: 85.32 mg/dL

## 2020-08-15 LAB — RESP PANEL BY RT-PCR (FLU A&B, COVID) ARPGX2
Influenza A by PCR: NEGATIVE
Influenza B by PCR: NEGATIVE
SARS Coronavirus 2 by RT PCR: NEGATIVE

## 2020-08-15 LAB — LACTIC ACID, PLASMA: Lactic Acid, Venous: 0.6 mmol/L (ref 0.5–1.9)

## 2020-08-15 MED ORDER — HYDROMORPHONE HCL 1 MG/ML IJ SOLN
1.0000 mg | Freq: Once | INTRAMUSCULAR | Status: AC
  Administered 2020-08-15: 1 mg via INTRAVENOUS
  Filled 2020-08-15: qty 1

## 2020-08-15 MED ORDER — ONDANSETRON HCL 4 MG/2ML IJ SOLN
4.0000 mg | Freq: Once | INTRAMUSCULAR | Status: AC
  Administered 2020-08-15: 4 mg via INTRAVENOUS
  Filled 2020-08-15: qty 2

## 2020-08-15 MED ORDER — ONDANSETRON HCL 4 MG/2ML IJ SOLN
4.0000 mg | Freq: Four times a day (QID) | INTRAMUSCULAR | Status: DC | PRN
  Administered 2020-08-16 – 2020-08-28 (×18): 4 mg via INTRAVENOUS
  Filled 2020-08-15 (×20): qty 2

## 2020-08-15 MED ORDER — SODIUM CHLORIDE 0.9 % IV BOLUS
1000.0000 mL | Freq: Once | INTRAVENOUS | Status: AC
  Administered 2020-08-15: 1000 mL via INTRAVENOUS

## 2020-08-15 MED ORDER — HEPARIN SODIUM (PORCINE) 5000 UNIT/ML IJ SOLN
5000.0000 [IU] | Freq: Three times a day (TID) | INTRAMUSCULAR | Status: DC
  Administered 2020-08-15 – 2020-08-28 (×38): 5000 [IU] via SUBCUTANEOUS
  Filled 2020-08-15 (×38): qty 1

## 2020-08-15 MED ORDER — PANTOPRAZOLE SODIUM 40 MG IV SOLR
40.0000 mg | Freq: Two times a day (BID) | INTRAVENOUS | Status: DC
  Administered 2020-08-15 – 2020-08-21 (×12): 40 mg via INTRAVENOUS
  Filled 2020-08-15 (×12): qty 40

## 2020-08-15 MED ORDER — ACETAMINOPHEN 325 MG PO TABS
650.0000 mg | ORAL_TABLET | Freq: Once | ORAL | Status: AC
  Administered 2020-08-15: 650 mg via ORAL
  Filled 2020-08-15: qty 2

## 2020-08-15 MED ORDER — POTASSIUM CHLORIDE 10 MEQ/100ML IV SOLN
10.0000 meq | INTRAVENOUS | Status: AC
  Administered 2020-08-15 (×2): 10 meq via INTRAVENOUS
  Filled 2020-08-15 (×2): qty 100

## 2020-08-15 MED ORDER — ONDANSETRON HCL 4 MG PO TABS
4.0000 mg | ORAL_TABLET | Freq: Four times a day (QID) | ORAL | Status: DC | PRN
  Administered 2020-08-15 – 2020-08-26 (×21): 4 mg via ORAL
  Filled 2020-08-15 (×21): qty 1

## 2020-08-15 MED ORDER — HYDROMORPHONE HCL 1 MG/ML IJ SOLN
1.0000 mg | INTRAMUSCULAR | Status: DC | PRN
  Administered 2020-08-15 – 2020-08-16 (×4): 1 mg via INTRAVENOUS
  Filled 2020-08-15 (×4): qty 1

## 2020-08-15 MED ORDER — IOHEXOL 300 MG/ML  SOLN
100.0000 mL | Freq: Once | INTRAMUSCULAR | Status: AC | PRN
  Administered 2020-08-15: 100 mL via INTRAVENOUS

## 2020-08-15 MED ORDER — LACTATED RINGERS IV SOLN: Freq: Once | INTRAVENOUS | Status: AC

## 2020-08-15 NOTE — ED Triage Notes (Signed)
Pt arrives to ED w/ c/o 7/10 LUQ abdominal pain that started last night. Pt endorses n/v/d. Pt recently admitted for pancreatitis.

## 2020-08-15 NOTE — ED Notes (Signed)
Attempted report 
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
## 2020-08-16 ENCOUNTER — Encounter (HOSPITAL_COMMUNITY): Payer: Self-pay | Admitting: Family Medicine

## 2020-08-16 DIAGNOSIS — K859 Acute pancreatitis without necrosis or infection, unspecified: Secondary | ICD-10-CM | POA: Diagnosis not present

## 2020-08-16 DIAGNOSIS — R1084 Generalized abdominal pain: Secondary | ICD-10-CM | POA: Diagnosis not present

## 2020-08-16 DIAGNOSIS — N179 Acute kidney failure, unspecified: Secondary | ICD-10-CM | POA: Diagnosis not present

## 2020-08-16 DIAGNOSIS — D649 Anemia, unspecified: Secondary | ICD-10-CM | POA: Diagnosis not present

## 2020-08-16 DIAGNOSIS — R7401 Elevation of levels of liver transaminase levels: Secondary | ICD-10-CM

## 2020-08-16 LAB — COMPREHENSIVE METABOLIC PANEL
ALT: 49 U/L — ABNORMAL HIGH (ref 0–44)
AST: 20 U/L (ref 15–41)
Albumin: 2.5 g/dL — ABNORMAL LOW (ref 3.5–5.0)
Alkaline Phosphatase: 150 U/L — ABNORMAL HIGH (ref 38–126)
Anion gap: 11 (ref 5–15)
BUN: 10 mg/dL (ref 6–20)
CO2: 15 mmol/L — ABNORMAL LOW (ref 22–32)
Calcium: 8.5 mg/dL — ABNORMAL LOW (ref 8.9–10.3)
Chloride: 111 mmol/L (ref 98–111)
Creatinine, Ser: 0.89 mg/dL (ref 0.44–1.00)
GFR, Estimated: >60 mL/min (ref >60)
Glucose, Bld: 77 mg/dL (ref 70–99)
Potassium: 3.6 mmol/L (ref 3.5–5.1)
Sodium: 137 mmol/L (ref 135–145)
Total Bilirubin: 0.8 mg/dL (ref 0.3–1.2)
Total Protein: 5.4 g/dL — ABNORMAL LOW (ref 6.5–8.1)

## 2020-08-16 LAB — CBC
HCT: 34.3 % — ABNORMAL LOW (ref 36.0–46.0)
Hemoglobin: 10.9 g/dL — ABNORMAL LOW (ref 12.0–15.0)
MCH: 30.5 pg (ref 26.0–34.0)
MCHC: 31.8 g/dL (ref 30.0–36.0)
MCV: 96.1 fL (ref 80.0–100.0)
Platelets: 380 10*3/uL (ref 150–400)
RBC: 3.57 MIL/uL — ABNORMAL LOW (ref 3.87–5.11)
RDW: 14.5 % (ref 11.5–15.5)
WBC: 10.7 10*3/uL — ABNORMAL HIGH (ref 4.0–10.5)
nRBC: 0 % (ref 0.0–0.2)

## 2020-08-16 MED ORDER — CHLORHEXIDINE GLUCONATE 0.12 % MT SOLN
15.0000 mL | Freq: Two times a day (BID) | OROMUCOSAL | Status: DC
  Administered 2020-08-16 – 2020-08-28 (×22): 15 mL via OROMUCOSAL
  Filled 2020-08-16 (×22): qty 15

## 2020-08-16 MED ORDER — HYDROMORPHONE HCL 1 MG/ML IJ SOLN
1.0000 mg | INTRAMUSCULAR | Status: DC | PRN
  Administered 2020-08-16 – 2020-08-27 (×48): 1 mg via INTRAVENOUS
  Filled 2020-08-16 (×50): qty 1

## 2020-08-16 MED ORDER — ORAL CARE MOUTH RINSE
15.0000 mL | Freq: Two times a day (BID) | OROMUCOSAL | Status: DC
  Administered 2020-08-16 – 2020-08-26 (×13): 15 mL via OROMUCOSAL

## 2020-08-16 MED ORDER — HYOSCYAMINE SULFATE ER 0.375 MG PO TB12
0.3750 mg | ORAL_TABLET | Freq: Once | ORAL | Status: AC
  Administered 2020-08-16: 0.375 mg via ORAL
  Filled 2020-08-16: qty 1

## 2020-08-16 MED ORDER — ADULT MULTIVITAMIN W/MINERALS CH
1.0000 | ORAL_TABLET | Freq: Every day | ORAL | Status: DC
  Administered 2020-08-17 – 2020-08-23 (×7): 1 via ORAL
  Filled 2020-08-16 (×7): qty 1

## 2020-08-16 MED ORDER — BOOST / RESOURCE BREEZE PO LIQD CUSTOM
1.0000 | Freq: Three times a day (TID) | ORAL | Status: DC
  Administered 2020-08-16 – 2020-08-21 (×14): 1 via ORAL

## 2020-08-16 MED ORDER — SODIUM CHLORIDE 0.9 % IV SOLN
1.0000 g | Freq: Three times a day (TID) | INTRAVENOUS | Status: DC
  Administered 2020-08-16 – 2020-08-23 (×22): 1 g via INTRAVENOUS
  Filled 2020-08-16 (×27): qty 1

## 2020-08-16 MED ORDER — OXYCODONE HCL 5 MG PO TABS
7.5000 mg | ORAL_TABLET | ORAL | Status: DC | PRN
  Administered 2020-08-16 – 2020-08-17 (×5): 7.5 mg via ORAL
  Filled 2020-08-16 (×5): qty 2
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
## 2020-08-16 NOTE — Progress Notes (Signed)
PROGRESS NOTE  Tricia Brown JEH:[id number] DOB: March 30, 1976 DOA: 08/15/2020 PCP: [name], MD  Brief History    Tricia Brown is a 44 y.o. female with medical history significant of  Gerd, Necrotizing Pancreatitis. Pt was just discharged on 08/07/20 for her pancreatitis.  [facility] was consulted patient had an endoscopic ultrasound that showed extrinsic compression in the posterior wall of the stomach with multiple cysts identified in the pancreatic body and tail, patient had AXIOS cystogastrostomy performed, plastic pigtail stents were placed.  Repeat imaging did show improvement of the pseudocyst, and patient had removal of the stents on 08/02/2020.  Patient was also treated with antibiotics and patient was discharged on pancreatic enzymes, colestipol, PPI therapy with as needed nausea therapy with Zofran.  She was tolerating a diet when she was discharged while this is a heavy ever heard of axials stent.  Patient was to follow-up with GI on outpatient basis.  [facility] were consulted to admit the patient for further evaluation and treatment. GI was consulted on admission. She was admitted to a medical bed. She is NPO with IV fluids, IV antiemetics, and pain control.   Consultants  . Gastroenterology  Procedures  . None  Antibiotics   Anti-infectives (From admission, onward)   Start     Dose/Rate Route Frequency Ordered Stop   08/16/20 1530  meropenem (MERREM) 1 g in sodium chloride 0.9 % 100 mL IVPB        1 g 200 mL/hr over 30 Minutes Intravenous Every 8 hours 08/16/20 1444      .  Subjective  The patient is resting in bed. She states that she continues to have pain, but that she is feeling a little better.  Objective   Vitals:  Vitals:   08/16/20 0512 08/16/20 1357  BP: 103/73 107/67  Pulse: 86 88  Resp: 17 18  Temp: 98.5 F (36.9 C) 98.3 F (36.8 C)  SpO2: 100% 100%   Exam:  Constitutional:  The patient is awake, alert, and oriented x 3. Mild  distress from abdominal pain. Eyes:  . pupils and irises appear normal . Normal lids and conjunctivae ENMT:  . grossly normal hearing  . Lips appear normal . external ears, nose appear normal . Oropharynx: mucosa, tongue,posterior pharynx appear normal Neck:  . neck appears normal, no masses, normal ROM, supple . no thyromegaly Respiratory:  . No increased work of breathing. . No wheezes, rales, or rhonchi . No tactile fremitus Cardiovascular:  . Regular rate and rhythm . No murmurs, ectopy, or gallups. . No lateral PMI. No thrills. Abdomen:  . Abdomen is distended, soft, and tender . It is somewhat erythematous  . No hernias, masses, or organomegaly . Hypoactive bowel sounds.  Musculoskeletal:  . No cyanosis, clubbing, or edema Skin:  . No rashes, lesions, ulcers . palpation of skin: no induration or nodules Neurologic:  . CN 2-12 intact . Sensation all 4 extremities intact Psychiatric:  . Mental status o Mood, affect appropriate o Orientation to person, place, time  . judgment and insight appear intact  I have personally reviewed the following:   Today's Data  . Vitals, CMP, CBC  Micro Data  . Blood cultures pending  Imaging  . CT abdomen and pelvis: Loculated phlegmon, pancreatic pseudocyst, gastric wall thickening, mild bowel wall thickening at the splenic flexure, likely reactive due to adjacent pancreatitis and phlegmon, acute pancreatitis with interval removal of transgastric pseudocyst drain  Scheduled Meds: . chlorhexidine  15 mL Mouth Rinse BID  .  heparin  5,000 Units Subcutaneous Q8H  . mouth rinse  15 mL Mouth Rinse q12n4p  . pantoprazole (PROTONIX) IV  40 mg Intravenous Q12H   Continuous Infusions: . meropenem (MERREM) IV 1 g (08/16/20 1614)    Principal Problem:   Abdominal pain Active Problems:   Pancreatitis   Hypokalemia   AKI (acute kidney injury) (HCC)   Transaminitis   Anemia   LOS: 1 day   A & P  Abdominal pain/  Pancreatitis: Now with development of a large phlegmon. I appreciate GI's assistance. They have started the patient on meropenem. She is also receiving IV fluids, pain control, and antiemetics. She is being kept NPO. If she does not improve in the next 2-3 days, surgical intervention will be considered.   Hypokalemia: Monitor.  AKI: Baseline creatinine appears to be 0.60- 0.67 range. Her creatinine was 1.11 on admission. This morning it was down to 0.89. She is receiving IV LR. Monitor creatinine, electrolytes, and volume status. Avoid nephrotoxins and hypotension. All medications will be renally dosed as appropriate.  Transaminitis: Mild. Defer to GI.   Anemia: Will check anemia profile.   Hypoalbuminemia: Consider nutrition consult.   I have seen and examined this patient myself. I have spent 35 minutes in his evaluation and care.  DVT prophylaxis: Heparin Code Status: Full code Family Communication: None available Disposition:  Status is: Inpatient  Remains inpatient appropriate because:IV treatments appropriate due to intensity of illness or inability to take PO   Dispo: The patient is from: Home              Anticipated d/c is to: Home              Anticipated d/c date is: 3 days              Patient currently is not medically stable to d/c. Tricia Kochanowski, DO [facility] Direct contact: see [email or web address]  7PM-7AM contact night coverage as above 08/16/2020, 5:17 PM  LOS: 1 day

## 2020-08-16 NOTE — Progress Notes (Signed)
Initial Nutrition Assessment  DOCUMENTATION CODES:   Not applicable  INTERVENTION:   -Boost Breeze po TID, each supplement provides 250 kcal and 9 grams of protein -MVI with minerals daily  NUTRITION DIAGNOSIS:   Increased nutrient needs related to acute illness (pancreatitis) as evidenced by estimated needs.  GOAL:   Patient will meet greater than or equal to 90% of their needs  MONITOR:   PO intake, Supplement acceptance, Diet advancement, Labs, Weight trends, Skin, I & O's  REASON FOR ASSESSMENT:   Malnutrition Screening Tool    ASSESSMENT:   Tricia Brown is a 44 y.o. female with medical history significant of  Gerd, Necrotizing Pancreatitis. Pt was just discharged on 08/07/20 for her pancreatitis.  [facility] was consulted patient had an endoscopic ultrasound that showed extrinsic compression in the posterior wall of the stomach with multiple cysts identified in the pancreatic body and tail, patient had AXIOS cystogastrostomy performed, plastic pigtail stents were placed.  Repeat imaging did show improvement of the pseudocyst, and patient had removal of the stents on 08/02/2020.  Patient was also treated with antibiotics and patient was discharged on pancreatic enzymes, colestipol, PPI therapy with as needed nausea therapy with Zofran.  She was tolerating a diet when she was discharged while this is a heavy ever heard of axials stent.  Patient was to follow-up with GI on outpatient basis.  Pt admitted with abdominal pain.   Reviewed I/O's: +1.1 L x 24 hours  Spoke with pt at bedside, who was pleasant and in good spirits today. Pt is very familiar to this RD due to recent hospital admission for pancreatitis with large fluid collection s/p drainage. Pt reports that she has was doing well after discharge up until 2 days ago. She reports that she consuming several small meals daily and consuming "Protein Plus" supplements once daily without difficulty.   Per pt, she started  experiencing nausea, vomiting, and abdominal pain 2 days PTA ("I started getting concerned when I couldn't keep down my Thanksgiving food or even saltines and gingerale").   Pt unsure if she has lost weight. Reviewed wt hx; pt has experienced a 8.2% wt loss over the past week, which is significant for time frame.   Discussed possibility for diet advancement. Pt is amenable to supplements once diet is advanced. Noted pt was advanced to clear liquid diet after RD visit; will add Boost Breeze.   Labs reviewed.   NUTRITION - FOCUSED PHYSICAL EXAM:    Most Recent Value  Orbital Region Mild depletion  Upper Arm Region No depletion  Thoracic and Lumbar Region No depletion  Buccal Region No depletion  Temple Region No depletion  Clavicle Bone Region No depletion  Clavicle and Acromion Bone Region No depletion  Scapular Bone Region No depletion  Dorsal Hand No depletion  Patellar Region No depletion  Anterior Thigh Region No depletion  Posterior Calf Region No depletion  Edema (RD Assessment) None  Hair Reviewed  Eyes Reviewed  Mouth Reviewed  Skin Reviewed  Nails Reviewed       Diet Order:   Diet Order            Diet clear liquid Room service appropriate? Yes; Fluid consistency: Thin  Diet effective now                 EDUCATION NEEDS:   Education needs have been addressed  Skin:  Skin Assessment: Reviewed RN Assessment  Last BM:  08/15/20  Height:   Ht Readings from  Last 1 Encounters:  08/15/20 5\' 7"  (1.702 m)    Weight:   Wt Readings from Last 1 Encounters:  08/15/20 58.4 kg    Ideal Body Weight:  61.4 kg  BMI:  Body mass index is 20.16 kg/m.  Estimated Nutritional Needs:   Kcal:  2000-2200  Protein:  105-120 grams  Fluid:  > 2 L    08/17/20, RD, LDN, CDCES Registered Dietitian II Certified Diabetes Care and Education Specialist Please refer to [facility] for RD and/or RD on-call/weekend/after hours pager

## 2020-08-16 NOTE — Progress Notes (Signed)
Pharmacy Antibiotic Note  Tricia Brown is a 44 y.o. female admitted on 08/15/2020 with severe acute pancreatitis.  Recently hospitalized for same issue from 10/20 to 11/17 and was treated with Zosyn. Readmitted for recurrent LUQ abdominal pain, corresponding to large phlegmonous collection near tail of pancreas and splenic hilum. Unclear whether infection versus inflammation alone. Pharmacy has been consulted for meropenem dosing.  Patient is afebrile, WBC trending down to 10.7. Renal function is stable, CrCl = 74 ml/min.   Plan: Meropenem 1 gm IV q8h F/u clinical improvement, renal function, and surgical plans  Height: 5\' 7"  (170.2 cm) Weight: 58.4 kg (128 lb 12 oz) IBW/kg (Calculated) : 61.6  Temp (24hrs), Avg:98.2 F (36.8 C), Min:98 F (36.7 C), Max:98.5 F (36.9 C)  Recent Labs  Lab 08/15/20 1335 08/15/20 2144 08/16/20 0230  WBC 14.0* 13.3* 10.7*  CREATININE 1.11* 1.00 0.89  LATICACIDVEN  --  0.6  --     Estimated Creatinine Clearance: 74.4 mL/min (by C-G formula based on SCr of 0.89 mg/dL).    No Known Allergies  Antimicrobials this admission: 11/26 meropenem >>  Microbiology results: Previous admit: 11/1 pseudocyst cx - C.albicans, Lactobacillus spp, Serratia (S to call except Ancef)  Thank you for allowing pharmacy to be a part of this patient's care.  13/1, PharmD, BCPS PGY2 Pharmacy Resident Phone between 7 am - 3:30 pm: Lulu Riding  Please check AMION for all [facility] phone numbers After 10:00 PM, call Main Pharmacy [phone]  08/16/2020 2:45 PM

## 2020-08-16 NOTE — Progress Notes (Signed)
IV team consult note:  RN able to obtain iv access at this time,requesting a second line prn .informed RN to place another consult if needing another line.

## 2020-08-17 DIAGNOSIS — K859 Acute pancreatitis without necrosis or infection, unspecified: Secondary | ICD-10-CM | POA: Diagnosis not present

## 2020-08-17 DIAGNOSIS — N179 Acute kidney failure, unspecified: Secondary | ICD-10-CM | POA: Diagnosis not present

## 2020-08-17 DIAGNOSIS — R1084 Generalized abdominal pain: Secondary | ICD-10-CM | POA: Diagnosis not present

## 2020-08-17 DIAGNOSIS — D649 Anemia, unspecified: Secondary | ICD-10-CM | POA: Diagnosis not present

## 2020-08-17 LAB — RETICULOCYTES
Immature Retic Fract: 7.7 % (ref 2.3–15.9)
RBC.: 3.62 MIL/uL — ABNORMAL LOW (ref 3.87–5.11)
Retic Count, Absolute: 31.1 10*3/uL (ref 19.0–186.0)
Retic Ct Pct: 0.9 % (ref 0.4–3.1)

## 2020-08-17 LAB — CBC
HCT: 33.7 % — ABNORMAL LOW (ref 36.0–46.0)
Hemoglobin: 10.8 g/dL — ABNORMAL LOW (ref 12.0–15.0)
MCH: 30.2 pg (ref 26.0–34.0)
MCHC: 32 g/dL (ref 30.0–36.0)
MCV: 94.1 fL (ref 80.0–100.0)
Platelets: 389 10*3/uL (ref 150–400)
RBC: 3.58 MIL/uL — ABNORMAL LOW (ref 3.87–5.11)
RDW: 14.3 % (ref 11.5–15.5)
WBC: 8.9 10*3/uL (ref 4.0–10.5)
nRBC: 0 % (ref 0.0–0.2)

## 2020-08-17 LAB — COMPREHENSIVE METABOLIC PANEL
ALT: 32 U/L (ref 0–44)
AST: 13 U/L — ABNORMAL LOW (ref 15–41)
Albumin: 2.7 g/dL — ABNORMAL LOW (ref 3.5–5.0)
Alkaline Phosphatase: 164 U/L — ABNORMAL HIGH (ref 38–126)
Anion gap: 9 (ref 5–15)
BUN: <5 mg/dL — ABNORMAL LOW (ref 6–20)
CO2: 21 mmol/L — ABNORMAL LOW (ref 22–32)
Calcium: 8.8 mg/dL — ABNORMAL LOW (ref 8.9–10.3)
Chloride: 107 mmol/L (ref 98–111)
Creatinine, Ser: 0.66 mg/dL (ref 0.44–1.00)
GFR, Estimated: >60 mL/min (ref >60)
Glucose, Bld: 111 mg/dL — ABNORMAL HIGH (ref 70–99)
Potassium: 3.5 mmol/L (ref 3.5–5.1)
Sodium: 137 mmol/L (ref 135–145)
Total Bilirubin: 0.3 mg/dL (ref 0.3–1.2)
Total Protein: 5.9 g/dL — ABNORMAL LOW (ref 6.5–8.1)

## 2020-08-17 LAB — FERRITIN: Ferritin: 230 ng/mL (ref 11–307)

## 2020-08-17 LAB — FOLATE: Folate: 16.8 ng/mL (ref >5.9)

## 2020-08-17 LAB — IRON AND TIBC
Iron: 17 ug/dL — ABNORMAL LOW (ref 28–170)
Saturation Ratios: 6 % — ABNORMAL LOW (ref 10.4–31.8)
TIBC: 273 ug/dL (ref 250–450)
UIBC: 256 ug/dL

## 2020-08-17 LAB — VITAMIN B12: Vitamin B-12: 948 pg/mL — ABNORMAL HIGH (ref 180–914)

## 2020-08-17 MED ORDER — HYDROCORTISONE (PERIANAL) 2.5 % EX CREA
1.0000 "application " | TOPICAL_CREAM | Freq: Two times a day (BID) | CUTANEOUS | Status: AC
  Administered 2020-08-17 – 2020-08-21 (×10): 1 via RECTAL
  Filled 2020-08-17: qty 28.35

## 2020-08-17 MED ORDER — OXYCODONE HCL 5 MG PO TABS
7.5000 mg | ORAL_TABLET | ORAL | Status: DC | PRN
  Administered 2020-08-17 – 2020-08-26 (×52): 15 mg via ORAL
  Filled 2020-08-17 (×52): qty 3

## 2020-08-17 MED ORDER — DICYCLOMINE HCL 10 MG PO CAPS
10.0000 mg | ORAL_CAPSULE | Freq: Three times a day (TID) | ORAL | Status: DC
  Administered 2020-08-17 – 2020-08-28 (×44): 10 mg via ORAL
  Filled 2020-08-17 (×45): qty 1

## 2020-08-17 MED ORDER — SODIUM CHLORIDE 0.9 % IV SOLN: 510.0000 mg | Freq: Once | INTRAVENOUS | Status: DC

## 2020-08-17 NOTE — Progress Notes (Signed)
Subjective: Patient reports ongoing abdominal pain, perhaps slightly better, in LUQ. Ongoing diarrhea.  Objective: Vital signs in last 24 hours: Temp:  [98.2 F (36.8 C)-98.4 F (36.9 C)] 98.2 F (36.8 C) (11/27 0447) Pulse Rate:  [88-91] 90 (11/27 0447) Resp:  [18] 18 (11/27 0447) BP: (107-113)/(67-81) 110/79 (11/27 0447) SpO2:  [100 %] 100 % (11/27 0447) Weight change:  Last BM Date: 08/16/20  PE: GEN:  NAD ABD:  Soft, mild epigastric and left upper quadrant tenderness, no peritonitis  Lab Results:  CBC    Component Value Date/Time   WBC 8.9 08/17/2020 0946   RBC 3.58 (L) 08/17/2020 0946   RBC 3.62 (L) 08/17/2020 0946   HGB 10.8 (L) 08/17/2020 0946   HGB 13.0 03/23/2013 1621   HCT 33.7 (L) 08/17/2020 0946   HCT 37.7 03/23/2013 1621   PLT 389 08/17/2020 0946   PLT 235 03/23/2013 1621   MCV 94.1 08/17/2020 0946   MCV 93 03/23/2013 1621   MCH 30.2 08/17/2020 0946   MCHC 32.0 08/17/2020 0946   RDW 14.3 08/17/2020 0946   RDW 12.1 03/23/2013 1621   LYMPHSABS 2.3 07/25/2020 0037   LYMPHSABS 3.6 06/28/2012 0009   MONOABS 0.9 07/25/2020 0037   MONOABS 0.6 06/28/2012 0009   EOSABS 0.5 07/25/2020 0037   EOSABS 0.3 06/28/2012 0009   BASOSABS 0.2 (H) 07/25/2020 0037   BASOSABS 0.1 06/28/2012 0009   CMP     Component Value Date/Time   NA 137 08/16/2020 0230   NA 139 03/23/2013 1621   K 3.6 08/16/2020 0230   K 4.0 03/23/2013 1621   CL 111 08/16/2020 0230   CL 105 03/23/2013 1621   CO2 15 (L) 08/16/2020 0230   CO2 28 03/23/2013 1621   GLUCOSE 77 08/16/2020 0230   GLUCOSE 95 03/23/2013 1621   BUN 10 08/16/2020 0230   BUN 9 03/23/2013 1621   CREATININE 0.89 08/16/2020 0230   CREATININE 1.05 03/23/2013 1621   CALCIUM 8.5 (L) 08/16/2020 0230   CALCIUM 8.9 03/23/2013 1621   PROT 5.4 (L) 08/16/2020 0230   PROT 7.2 06/28/2012 0009   ALBUMIN 2.5 (L) 08/16/2020 0230   ALBUMIN 3.9 06/28/2012 0009   AST 20 08/16/2020 0230   AST 31 06/28/2012 0009   ALT 49 (H)  08/16/2020 0230   ALT 34 06/28/2012 0009   ALKPHOS 150 (H) 08/16/2020 0230   ALKPHOS 178 (H) 06/28/2012 0009   BILITOT 0.8 08/16/2020 0230   BILITOT 0.2 06/28/2012 0009   GFRNONAA >60 08/16/2020 0230   GFRNONAA >60 03/23/2013 1621   GFRAA >60 12/18/2019 1504   GFRAA >60 03/23/2013 1621   Assessment:  1.  Severe acute idiopathic pancreatitis, complicated by large pseudocyst (endoscopic drainage with Dr. Meridee Score) and lengthy recent hospitalization. 2.  New admission now for recurrent abdominal pain, LUQ, in region corresponding to large phlegmonous collection near tail of pancreas and splenic hilum.  Plan:  1.  Continue supportive care (IVF, judicious analgesics).  We added antibiotics yesterday given concern for possible infectious component of her LUQ phlegmonous collection. 2.  If no improvement over the next couple days, would consider surgical consultation (debridement?) and would ask Dr. Meridee Score to look at her new CT scan to see if endoscopic drainage would be an option (based on my review of scan the fluid collection seems too multiloculated and complex for feasible drainage). 3.  Clear liquid diet only. 4.  [facility] will follow.   Freddy Jaksch 08/17/2020, 10:38 AM   Cell [phone]  If no answer or after 5 PM call [phone]

## 2020-08-17 NOTE — Progress Notes (Signed)
PROGRESS NOTE  Tricia Brown UXN:[id number] DOB: January 19, 1976 DOA: 08/15/2020 PCP: [name], MD  Brief History    Tricia Brown is a 44 y.o. female with medical history significant of  Gerd, Necrotizing Pancreatitis. Pt was just discharged on 08/07/20 for her pancreatitis.  [facility] was consulted patient had an endoscopic ultrasound that showed extrinsic compression in the posterior wall of the stomach with multiple cysts identified in the pancreatic body and tail, patient had AXIOS cystogastrostomy performed, plastic pigtail stents were placed.  Repeat imaging did show improvement of the pseudocyst, and patient had removal of the stents on 08/02/2020.  Patient was also treated with antibiotics and patient was discharged on pancreatic enzymes, colestipol, PPI therapy with as needed nausea therapy with Zofran.  She was tolerating a diet when she was discharged while this is a heavy ever heard of axials stent.  Patient was to follow-up with GI on outpatient basis.  [facility] were consulted to admit the patient for further evaluation and treatment. GI was consulted on admission. She was admitted to a medical bed. She is NPO with IV fluids, IV antiemetics, and pain control.   Consultants  . Gastroenterology  Procedures  . None  Antibiotics   Anti-infectives (From admission, onward)   Start     Dose/Rate Route Frequency Ordered Stop   08/16/20 1530  meropenem (MERREM) 1 g in sodium chloride 0.9 % 100 mL IVPB        1 g 200 mL/hr over 30 Minutes Intravenous Every 8 hours 08/16/20 1444       Subjective  The patient is resting in bed. She states that her pain is worse and her abdomen is more distended.  Objective   Vitals:  Vitals:   08/17/20 0447 08/17/20 1402  BP: 110/79 119/66  Pulse: 90 (!) 108  Resp: 18 19  Temp: 98.2 F (36.8 C) 98 F (36.7 C)  SpO2: 100% 92%   Exam:  Constitutional:  The patient is awake, alert, and oriented x 3. Mod distress from  abdominal pain. Respiratory:  . No increased work of breathing. . No wheezes, rales, or rhonchi . No tactile fremitus Cardiovascular:  . Regular rate and rhythm . No murmurs, ectopy, or gallups. . No lateral PMI. No thrills. Abdomen:  . Abdomen is more distended, and tender . It is somewhat erythematous  . No hernias, masses, or organomegaly . Hypoactive bowel sounds.  Musculoskeletal:  . No cyanosis, clubbing, or edema Skin:  . No rashes, lesions, ulcers . palpation of skin: no induration or nodules . Mild jaundice Neurologic:  . CN 2-12 intact . Sensation all 4 extremities intact Psychiatric:  . Mental status o Mood, affect appropriate o Orientation to person, place, time  . judgment and insight appear intact  I have personally reviewed the following:   Today's Data  . Vitals, CMP, CBC  Micro Data  . Blood cultures pending  Imaging  . CT abdomen and pelvis: Loculated phlegmon, pancreatic pseudocyst, gastric wall thickening, mild bowel wall thickening at the splenic flexure, likely reactive due to adjacent pancreatitis and phlegmon, acute pancreatitis with interval removal of transgastric pseudocyst drain  Scheduled Meds: . chlorhexidine  15 mL Mouth Rinse BID  . dicyclomine  10 mg Oral TID AC & HS  . feeding supplement  1 Container Oral TID BM  . heparin  5,000 Units Subcutaneous Q8H  . hydrocortisone  1 application Rectal BID  . mouth rinse  15 mL Mouth Rinse q12n4p  . multivitamin with  minerals  1 tablet Oral Daily  . pantoprazole (PROTONIX) IV  40 mg Intravenous Q12H   Continuous Infusions: . meropenem (MERREM) IV 1 g (08/17/20 1549)    Principal Problem:   Abdominal pain Active Problems:   Pancreatitis   Hypokalemia   AKI (acute kidney injury) (HCC)   Transaminitis   Anemia   LOS: 2 days   A & P  Abdominal pain/ Pancreatitis: Now with development of a large phlegmon. I appreciate GI's assistance. They have started the patient on meropenem. She  is also receiving IV fluids, pain control, and antiemetics. She is being kept NPO. If she does not improve in the next 2-3 days, surgical intervention will be considered. Dr. Meridee Score to evaluate the patient's films for possible drainage of phlegmon. Oxycodone dose and frequency increased.  Hypokalemia: Resolved. Monitor.  AKI: Baseline creatinine appears to be 0.60- 0.67 range. Her creatinine was 1.11 on admission. This morning it was down to 0.89. She is receiving IV LR. Monitor creatinine, electrolytes, and volume status. Avoid nephrotoxins and hypotension. All medications will be renally dosed as appropriate.  Transaminitis: Mild. Defer to GI.   Anemia: Will check anemia profile.   Hypoalbuminemia: Consider nutrition consult.   I have seen and examined this patient myself. I have spent 35 minutes in his evaluation and care.  DVT prophylaxis: Heparin Code Status: Full code Family Communication: None available Disposition:  Status is: Inpatient  Remains inpatient appropriate because:IV treatments appropriate due to intensity of illness or inability to take PO   Dispo: The patient is from: Home              Anticipated d/c is to: Home              Anticipated d/c date is: 3 days              Patient currently is not medically stable to d/c. Aunisty Reali, DO [facility] Direct contact: see [email or web address]  7PM-7AM contact night coverage as above 08/17/2020, 4:47 PM  LOS: 1 day

## 2020-08-18 DIAGNOSIS — D649 Anemia, unspecified: Secondary | ICD-10-CM | POA: Diagnosis not present

## 2020-08-18 DIAGNOSIS — K859 Acute pancreatitis without necrosis or infection, unspecified: Secondary | ICD-10-CM | POA: Diagnosis not present

## 2020-08-18 DIAGNOSIS — N179 Acute kidney failure, unspecified: Secondary | ICD-10-CM | POA: Diagnosis not present

## 2020-08-18 DIAGNOSIS — R1084 Generalized abdominal pain: Secondary | ICD-10-CM | POA: Diagnosis not present

## 2020-08-18 LAB — LIPASE, BLOOD: Lipase: 95 U/L — ABNORMAL HIGH (ref 11–51)

## 2020-08-18 LAB — CBC WITH DIFFERENTIAL/PLATELET
Abs Immature Granulocytes: 0.04 10*3/uL (ref 0.00–0.07)
Basophils Absolute: 0.1 10*3/uL (ref 0.0–0.1)
Basophils Relative: 1 %
Eosinophils Absolute: 0.4 10*3/uL (ref 0.0–0.5)
Eosinophils Relative: 4 %
HCT: 32.3 % — ABNORMAL LOW (ref 36.0–46.0)
Hemoglobin: 10.2 g/dL — ABNORMAL LOW (ref 12.0–15.0)
Immature Granulocytes: 0 %
Lymphocytes Relative: 14 %
Lymphs Abs: 1.4 10*3/uL (ref 0.7–4.0)
MCH: 29.7 pg (ref 26.0–34.0)
MCHC: 31.6 g/dL (ref 30.0–36.0)
MCV: 93.9 fL (ref 80.0–100.0)
Monocytes Absolute: 0.7 10*3/uL (ref 0.1–1.0)
Monocytes Relative: 7 %
Neutro Abs: 7.6 10*3/uL (ref 1.7–7.7)
Neutrophils Relative %: 74 %
Platelets: 394 10*3/uL (ref 150–400)
RBC: 3.44 MIL/uL — ABNORMAL LOW (ref 3.87–5.11)
RDW: 14.4 % (ref 11.5–15.5)
WBC: 10.2 10*3/uL (ref 4.0–10.5)
nRBC: 0 % (ref 0.0–0.2)

## 2020-08-18 LAB — COMPREHENSIVE METABOLIC PANEL
ALT: 27 U/L (ref 0–44)
AST: 10 U/L — ABNORMAL LOW (ref 15–41)
Albumin: 2.8 g/dL — ABNORMAL LOW (ref 3.5–5.0)
Alkaline Phosphatase: 148 U/L — ABNORMAL HIGH (ref 38–126)
Anion gap: 10 (ref 5–15)
BUN: <5 mg/dL — ABNORMAL LOW (ref 6–20)
CO2: 20 mmol/L — ABNORMAL LOW (ref 22–32)
Calcium: 8.9 mg/dL (ref 8.9–10.3)
Chloride: 108 mmol/L (ref 98–111)
Creatinine, Ser: 0.54 mg/dL (ref 0.44–1.00)
GFR, Estimated: >60 mL/min (ref >60)
Glucose, Bld: 114 mg/dL — ABNORMAL HIGH (ref 70–99)
Potassium: 3.6 mmol/L (ref 3.5–5.1)
Sodium: 138 mmol/L (ref 135–145)
Total Bilirubin: 0.3 mg/dL (ref 0.3–1.2)
Total Protein: 5.9 g/dL — ABNORMAL LOW (ref 6.5–8.1)

## 2020-08-18 MED ORDER — COLESTIPOL HCL 1 G PO TABS
1.0000 g | ORAL_TABLET | Freq: Two times a day (BID) | ORAL | Status: DC
  Administered 2020-08-18 – 2020-08-20 (×4): 1 g via ORAL
  Filled 2020-08-18 (×4): qty 1

## 2020-08-18 MED ORDER — SODIUM CHLORIDE 0.9% FLUSH: 10.0000 mL | INTRAVENOUS | Status: DC | PRN

## 2020-08-18 MED ORDER — SODIUM CHLORIDE 0.9% FLUSH
10.0000 mL | Freq: Two times a day (BID) | INTRAVENOUS | Status: DC
  Administered 2020-08-18 – 2020-08-22 (×8): 10 mL

## 2020-08-18 NOTE — Progress Notes (Signed)
Subjective: Ongoing abdominal pain. Very slight improvement since admission. Feels a little more distended.  Objective: Vital signs in last 24 hours: Temp:  [98 F (36.7 C)-98.3 F (36.8 C)] 98.2 F (36.8 C) (11/28 0449) Pulse Rate:  [89-108] 89 (11/28 0449) Resp:  [18-19] 18 (11/28 0449) BP: (119-137)/(66-85) 133/85 (11/28 0449) SpO2:  [92 %-100 %] 100 % (11/28 0449) Weight change:  Last BM Date: 08/17/20  PE: GEN:  NAD ABD:  Mild to moderate protuberant, moderate EG/LUQ tenderness, voluntary guarding, no peritonitis.  Lab Results: CBC    Component Value Date/Time   WBC 10.2 08/18/2020 0022   RBC 3.44 (L) 08/18/2020 0022   HGB 10.2 (L) 08/18/2020 0022   HGB 13.0 03/23/2013 1621   HCT 32.3 (L) 08/18/2020 0022   HCT 37.7 03/23/2013 1621   PLT 394 08/18/2020 0022   PLT 235 03/23/2013 1621   MCV 93.9 08/18/2020 0022   MCV 93 03/23/2013 1621   MCH 29.7 08/18/2020 0022   MCHC 31.6 08/18/2020 0022   RDW 14.4 08/18/2020 0022   RDW 12.1 03/23/2013 1621   LYMPHSABS 1.4 08/18/2020 0022   LYMPHSABS 3.6 06/28/2012 0009   MONOABS 0.7 08/18/2020 0022   MONOABS 0.6 06/28/2012 0009   EOSABS 0.4 08/18/2020 0022   EOSABS 0.3 06/28/2012 0009   BASOSABS 0.1 08/18/2020 0022   BASOSABS 0.1 06/28/2012 0009   CMP     Component Value Date/Time   NA 138 08/18/2020 0022   NA 139 03/23/2013 1621   K 3.6 08/18/2020 0022   K 4.0 03/23/2013 1621   CL 108 08/18/2020 0022   CL 105 03/23/2013 1621   CO2 20 (L) 08/18/2020 0022   CO2 28 03/23/2013 1621   GLUCOSE 114 (H) 08/18/2020 0022   GLUCOSE 95 03/23/2013 1621   BUN <5 (L) 08/18/2020 0022   BUN 9 03/23/2013 1621   CREATININE 0.54 08/18/2020 0022   CREATININE 1.05 03/23/2013 1621   CALCIUM 8.9 08/18/2020 0022   CALCIUM 8.9 03/23/2013 1621   PROT 5.9 (L) 08/18/2020 0022   PROT 7.2 06/28/2012 0009   ALBUMIN 2.8 (L) 08/18/2020 0022   ALBUMIN 3.9 06/28/2012 0009   AST 10 (L) 08/18/2020 0022   AST 31 06/28/2012 0009   ALT 27  08/18/2020 0022   ALT 34 06/28/2012 0009   ALKPHOS 148 (H) 08/18/2020 0022   ALKPHOS 178 (H) 06/28/2012 0009   BILITOT 0.3 08/18/2020 0022   BILITOT 0.2 06/28/2012 0009   GFRNONAA >60 08/18/2020 0022   GFRNONAA >60 03/23/2013 1621   GFRAA >60 12/18/2019 1504   GFRAA >60 03/23/2013 1621   Assessment:  1. Severe acute idiopathic pancreatitis, complicated by large pseudocyst (endoscopic drainage with Dr. Meridee Score) and lengthy recent hospitalization. 2. New admission now for recurrent abdominal pain, LUQ, in region corresponding to large phlegmonous collection near tail of pancreas and splenic hilum.  Plan:  1.  Add colestipol; continue dicyclomine (for persistent chronic diarrhea). 2.  Epic secure chat message sent to Dr. Meridee Score asking his opinion regarding feasibility of LUQ phlegmonous collection drainage endoscopically; if not feasible, would then recommend surgical consultation. 3.  [facility] will follow.   Tricia Brown 08/18/2020, 1:14 PM   Cell [phone] If no answer or after 5 PM call [phone]

## 2020-08-18 NOTE — Plan of Care (Signed)
°  Problem: Health Behavior/Discharge Planning: Goal: Ability to manage health-related needs will improve Outcome: Progressing   Problem: Clinical Measurements: Goal: Will remain free from infection Outcome: Progressing   Problem: Elimination: Goal: Will not experience complications related to bowel motility Outcome: Progressing Goal: Will not experience complications related to urinary retention Outcome: Progressing   Problem: Pain Managment: Goal: General experience of comfort will improve Outcome: Progressing   Problem: Safety: Goal: Ability to remain free from injury will improve Outcome: Progressing   Problem: Education: Goal: Knowledge of Pancreatitis treatment and prevention will improve Outcome: Progressing   Problem: Health Behavior/Discharge Planning: Goal: Ability to formulate a plan to maintain an alcohol-free life will improve Outcome: Progressing   Problem: Nutritional: Goal: Ability to achieve adequate nutritional intake will improve Outcome: Progressing   Problem: Clinical Measurements: Goal: Complications related to the disease process, condition or treatment will be avoided or minimized Outcome: Progressing   

## 2020-08-18 NOTE — Progress Notes (Signed)
PROGRESS NOTE  Tricia Brown IOE:[id number] DOB: October 26, 1975 DOA: 08/15/2020 PCP: [name], MD  Brief History    Tricia Brown is a 44 y.o. female with medical history significant of  Gerd, Necrotizing Pancreatitis. Pt was just discharged on 08/07/20 for her pancreatitis.  [facility] was consulted patient had an endoscopic ultrasound that showed extrinsic compression in the posterior wall of the stomach with multiple cysts identified in the pancreatic body and tail, patient had AXIOS cystogastrostomy performed, plastic pigtail stents were placed.  Repeat imaging did show improvement of the pseudocyst, and patient had removal of the stents on 08/02/2020.  Patient was also treated with antibiotics and patient was discharged on pancreatic enzymes, colestipol, PPI therapy with as needed nausea therapy with Zofran.  She was tolerating a diet when she was discharged while this is a heavy ever heard of axials stent.  Patient was to follow-up with GI on outpatient basis.  [facility] were consulted to admit the patient for further evaluation and treatment. GI was consulted on admission. She was admitted to a medical bed. She is NPO with IV fluids, IV antiemetics, and pain control.   Consultants  . Gastroenterology  Procedures  . None  Antibiotics   Anti-infectives (From admission, onward)   Start     Dose/Rate Route Frequency Ordered Stop   08/16/20 1530  meropenem (MERREM) 1 g in sodium chloride 0.9 % 100 mL IVPB        1 g 200 mL/hr over 30 Minutes Intravenous Every 8 hours 08/16/20 1444       Subjective  The patient is resting in bed. The patient states that she is feeling somewhat better.  Objective   Vitals:  Vitals:   08/18/20 0449 08/18/20 1415  BP: 133/85 (!) 141/70  Pulse: 89 85  Resp: 18 18  Temp: 98.2 F (36.8 C) 98 F (36.7 C)  SpO2: 100% 100%   Exam:  Constitutional:  The patient is awake, alert, and oriented x 3. No acute distress. Respiratory:  . No  increased work of breathing. . No wheezes, rales, or rhonchi . No tactile fremitus Cardiovascular:  . Regular rate and rhythm . No murmurs, ectopy, or gallups. . No lateral PMI. No thrills. Abdomen:  . Abdomen is less distended, and less tender . It is somewhat less erythematous  . No hernias, masses, or organomegaly . Improved bowel sounds.  Musculoskeletal:  . No cyanosis, clubbing, or edema Skin:  . No rashes, lesions, ulcers . palpation of skin: no induration or nodules . Mild jaundice Neurologic:  . CN 2-12 intact . Sensation all 4 extremities intact Psychiatric:  . Mental status o Mood, affect appropriate o Orientation to person, place, time  . judgment and insight appear intact  I have personally reviewed the following:   Today's Data  . Vitals, CMP, CBC  Micro Data  . Blood cultures pending  Imaging  . CT abdomen and pelvis: Loculated phlegmon, pancreatic pseudocyst, gastric wall thickening, mild bowel wall thickening at the splenic flexure, likely reactive due to adjacent pancreatitis and phlegmon, acute pancreatitis with interval removal of transgastric pseudocyst drain  Scheduled Meds: . chlorhexidine  15 mL Mouth Rinse BID  . colestipol  1 g Oral BID  . dicyclomine  10 mg Oral TID AC & HS  . feeding supplement  1 Container Oral TID BM  . heparin  5,000 Units Subcutaneous Q8H  . hydrocortisone  1 application Rectal BID  . mouth rinse  15 mL Mouth Rinse q12n4p  .  multivitamin with minerals  1 tablet Oral Daily  . pantoprazole (PROTONIX) IV  40 mg Intravenous Q12H  . sodium chloride flush  10-40 mL Intracatheter Q12H   Continuous Infusions: . meropenem (MERREM) IV 1 g (08/18/20 0853)    Principal Problem:   Abdominal pain Active Problems:   Pancreatitis   Hypokalemia   AKI (acute kidney injury) (HCC)   Transaminitis   Anemia   LOS: 3 days   A & P  Abdominal pain/ Pancreatitis: Now with development of a large phlegmon. I appreciate GI's  assistance. They have started the patient on meropenem. She is also receiving IV fluids, pain control, and antiemetics. She is being kept NPO. If she does not improve in the next 2-3 days, surgical intervention will be considered. Dr. Meridee Score to evaluate the patient's films for possible drainage of phlegmon. Oxycodone dose and frequency increased. Somewhat improved today.  Hypokalemia: Resolved. Monitor.  AKI: Baseline creatinine appears to be 0.60- 0.67 range. Her creatinine was 1.11 on admission. This morning it was down to 0.54.  Monitor creatinine, electrolytes, and volume status. Avoid nephrotoxins and hypotension. All medications will be renally dosed as appropriate.  Transaminitis: Mild. Defer to GI.   Anemia: Will check anemia profile.   Hypoalbuminemia: Consider nutrition consult.   I have seen and examined this patient myself. I have spent 30 minutes in her evaluation and care.  DVT prophylaxis: Heparin Code Status: Full code Family Communication: Spouse is at bedside Disposition:  Status is: Inpatient  Remains inpatient appropriate because:IV treatments appropriate due to intensity of illness or inability to take PO   Dispo: The patient is from: Home              Anticipated d/c is to: Home              Anticipated d/c date is: 3 days              Patient currently is not medically stable to d/c. Zaraya Delauder, DO [facility] Direct contact: see [email or web address]  7PM-7AM contact night coverage as above 08/18/2020, 4:15 PM  LOS: 1 day

## 2020-08-19 DIAGNOSIS — K859 Acute pancreatitis without necrosis or infection, unspecified: Secondary | ICD-10-CM | POA: Diagnosis not present

## 2020-08-19 DIAGNOSIS — N179 Acute kidney failure, unspecified: Secondary | ICD-10-CM | POA: Diagnosis not present

## 2020-08-19 DIAGNOSIS — R1084 Generalized abdominal pain: Secondary | ICD-10-CM | POA: Diagnosis not present

## 2020-08-19 DIAGNOSIS — D649 Anemia, unspecified: Secondary | ICD-10-CM | POA: Diagnosis not present

## 2020-08-19 LAB — I-STAT BETA HCG BLOOD, ED (NOT ORDERABLE): I-stat hCG, quantitative: 6.5 m[IU]/mL — ABNORMAL HIGH (ref <5)

## 2020-08-19 MED ORDER — FLUTICASONE PROPIONATE 50 MCG/ACT NA SUSP
2.0000 | Freq: Every day | NASAL | Status: DC
  Administered 2020-08-19 – 2020-08-27 (×9): 2 via NASAL
  Filled 2020-08-19: qty 16

## 2020-08-19 MED ORDER — SODIUM CHLORIDE 0.9 % IV SOLN: INTRAVENOUS | Status: AC

## 2020-08-19 NOTE — Progress Notes (Signed)
Pharmacy Antibiotic Note  Tricia Brown is a 44 y.o. female admitted on 08/15/2020 with severe acute pancreatitis.  Recently hospitalized for same issue from 10/20 to 11/17 and was treated with Zosyn and fluconazole. Readmitted for recurrent LUQ abdominal pain, corresponding to large phlegmonous collection near tail of pancreas and splenic hilum. Unclear whether infection versus inflammation alone. Pharmacy has been consulted for meropenem dosing.  Renal function stable, afebrile, WBC normalized.  Plan: Continue Merrem 1gm IV Q8H Pharmacy will sign off as renal function is stable.  Thank you for the consult!  Height: 5\' 7"  (170.2 cm) Weight: 58.4 kg (128 lb 12 oz) IBW/kg (Calculated) : 61.6  Temp (24hrs), Avg:97.8 F (36.6 C), Min:97.5 F (36.4 C), Max:98 F (36.7 C)  Recent Labs  Lab 08/15/20 1335 08/15/20 2144 08/16/20 0230 08/17/20 0946 08/18/20 0022  WBC 14.0* 13.3* 10.7* 8.9 10.2  CREATININE 1.11* 1.00 0.89 0.66 0.54  LATICACIDVEN  --  0.6  --   --   --     Estimated Creatinine Clearance: 82.7 mL/min (by C-G formula based on SCr of 0.54 mg/dL).    No Known Allergies  Merrem 11/26>>  Previous admit: 11/1 pseudocyst cx - C.albicans, Lactobacillus spp, Serratia (S to call except Ancef)   Jazir Newey D. 13/1, PharmD, BCPS, BCCCP 08/19/2020, 8:47 AM

## 2020-08-19 NOTE — Progress Notes (Signed)
Tricia Brown 4:14 PM  Subjective: Patient familiar to me from previous hospitalization and her case discussed with Dr. Meridee Score as well as our PA and Dr. Dulce Sellar and she is not feeling as well as she did last time but has no new complaints  Objective: Vital signs stable afebrile seems a little more tender to me looks a little more ill to me not in as good a spirit labs okay CT reviewed Assessment: Worsening pancreatitis  Plan: Consider TPN versus nasal jejunal feeds which she is not in favor of and pain management will be a big question and might need to consider surgical consult versus IR evaluation to see if they have anything to add [facility]  office [phone] After 5PM or if no answer call [phone]

## 2020-08-19 NOTE — Progress Notes (Signed)
[facility] Gastroenterology Progress Note  Tricia Brown 44 y.o. 1975-10-07  CC:  Pancreatitis   Subjective: Patient reports continued abdominal pain, though it has improved with pain medication.  Currently, 3-4 out of 10.  Denies nausea or vomiting.  Is tolerating clear liquids.  Continues to have diarrhea.  ROS : Review of Systems  Cardiovascular: Negative for chest pain and palpitations.  Gastrointestinal: Positive for abdominal pain and diarrhea. Negative for blood in stool, constipation, heartburn, melena, nausea and vomiting.   Objective: Vital signs in last 24 hours: Vitals:   08/18/20 2105 08/19/20 0530  BP: 129/72 (!) 143/92  Pulse: 98 94  Resp: 18 17  Temp: (!) 97.5 F (36.4 C) 98.2 F (36.8 C)  SpO2: 100% 98%    Physical Exam:  General:  Lethargic, ill-appearing, cooperative, no distress  Head:  Normocephalic, without obvious abnormality, atraumatic  Eyes:  Anicteric sclera, EOMs intact  Lungs:   Clear to auscultation bilaterally, respirations unlabored  Heart:  Regular rate and rhythm, S1, S2 normal  Abdomen:   Distended with moderate diffuse tenderness, bowel sounds active all four quadrants,  no peritoneal signs   Extremities: Extremities normal, atraumatic, no  edema  Pulses: 2+ and symmetric    Lab Results: Recent Labs    08/17/20 0946 08/18/20 0022  NA 137 138  K 3.5 3.6  CL 107 108  CO2 21* 20*  GLUCOSE 111* 114*  BUN <5* <5*  CREATININE 0.66 0.54  CALCIUM 8.8* 8.9   Recent Labs    08/17/20 0946 08/18/20 0022  AST 13* 10*  ALT 32 27  ALKPHOS 164* 148*  BILITOT 0.3 0.3  PROT 5.9* 5.9*  ALBUMIN 2.7* 2.8*   Recent Labs    08/17/20 0946 08/18/20 0022  WBC 8.9 10.2  NEUTROABS  --  7.6  HGB 10.8* 10.2*  HCT 33.7* 32.3*  MCV 94.1 93.9  PLT 389 394   No results for input(s): LABPROT, INR in the last 72 hours.  Assessment: Severe pancreatitis with pseudocyst formation, now with worsening abdominal pain and progressive phlegmon in left  upper quadrant. -WBCs trending down, now 10.2 -Mildly elevated ALP, otherwise normal LFTs. -Normal renal function (BUN <5, Cr 0.54)   Plan: CT reviewed by Dr. Meridee Score, new formation is multilocular and not amenable to cyst gastrostomy at this time.  Start IV fluids at 100 cc/h  Continue meropenem for now.  Pain control.  Clear liquid diet.  [facility] will follow.  Edrick Kins PA-C 08/19/2020, 1:32 PM  Contact #  [phone]

## 2020-08-19 NOTE — Progress Notes (Signed)
PROGRESS NOTE  Tricia Brown MBW:[id number] DOB: 08-17-1976 DOA: 08/15/2020 PCP: [name], MD  Brief History    Tricia Brown is a 44 y.o. female with medical history significant of  Gerd, Necrotizing Pancreatitis. Pt was just discharged on 08/07/20 for her pancreatitis.  [facility] was consulted patient had an endoscopic ultrasound that showed extrinsic compression in the posterior wall of the stomach with multiple cysts identified in the pancreatic body and tail, patient had AXIOS cystogastrostomy performed, plastic pigtail stents were placed.  Repeat imaging did show improvement of the pseudocyst, and patient had removal of the stents on 08/02/2020.  Patient was also treated with antibiotics and patient was discharged on pancreatic enzymes, colestipol, PPI therapy with as needed nausea therapy with Zofran.  She was tolerating a diet when she was discharged while this is a heavy ever heard of axials stent.  Patient was to follow-up with GI on outpatient basis.  [facility] were consulted to admit the patient for further evaluation and treatment. GI was consulted on admission. She was admitted to a medical bed. She is NPO with IV fluids, IV antiemetics, and pain control. Abdominal CT has been reviewed by Dr. Meridee Score. The new formation is multilocular and not amenable to cyst gastrostomy at this time.  Consultants  . Gastroenterology  Procedures  . None  Antibiotics   Anti-infectives (From admission, onward)   Start     Dose/Rate Route Frequency Ordered Stop   08/16/20 1530  meropenem (MERREM) 1 g in sodium chloride 0.9 % 100 mL IVPB        1 g 200 mL/hr over 30 Minutes Intravenous Every 8 hours 08/16/20 1444       Subjective  The patient is resting in bed. No new complaints.  Objective   Vitals:  Vitals:   08/19/20 0530 08/19/20 1417  BP: (!) 143/92 135/81  Pulse: 94 85  Resp: 17 18  Temp: 98.2 F (36.8 C)   SpO2: 98% 100%   Exam:  Constitutional:  The  patient is awake, alert, and oriented x 3. No acute distress. Respiratory:  . No increased work of breathing. . No wheezes, rales, or rhonchi . No tactile fremitus Cardiovascular:  . Regular rate and rhythm . No murmurs, ectopy, or gallups. . No lateral PMI. No thrills. Abdomen:  . Abdomen is slightly distended, and  tender . It is somewhat less erythematous  . No hernias, masses, or organomegaly . Positive bowel sounds.  Musculoskeletal:  . No cyanosis, clubbing, or edema Skin:  . No rashes, lesions, ulcers . palpation of skin: no induration or nodules . Mild jaundice Neurologic:  . CN 2-12 intact . Sensation all 4 extremities intact Psychiatric:  . Mental status o Mood, affect appropriate o Orientation to person, place, time  . judgment and insight appear intact  I have personally reviewed the following:   Today's Data  . Vitals, CMP, CBC  Micro Data  . Blood cultures pending  Imaging  . CT abdomen and pelvis: Loculated phlegmon, pancreatic pseudocyst, gastric wall thickening, mild bowel wall thickening at the splenic flexure, likely reactive due to adjacent pancreatitis and phlegmon, acute pancreatitis with interval removal of transgastric pseudocyst drain  Scheduled Meds: . chlorhexidine  15 mL Mouth Rinse BID  . colestipol  1 g Oral BID  . dicyclomine  10 mg Oral TID AC & HS  . feeding supplement  1 Container Oral TID BM  . fluticasone  2 spray Each Nare Daily  . heparin  5,000  Units Subcutaneous Q8H  . hydrocortisone  1 application Rectal BID  . mouth rinse  15 mL Mouth Rinse q12n4p  . multivitamin with minerals  1 tablet Oral Daily  . pantoprazole (PROTONIX) IV  40 mg Intravenous Q12H  . sodium chloride flush  10-40 mL Intracatheter Q12H   Continuous Infusions: . sodium chloride    . meropenem (MERREM) IV 1 g (08/19/20 0905)    Principal Problem:   Abdominal pain Active Problems:   Pancreatitis   Hypokalemia   AKI (acute kidney injury) (HCC)    Transaminitis   Anemia   LOS: 4 days   A & P  Abdominal pain/ Pancreatitis: Now with development of a large phlegmon. I appreciate GI's assistance. They have started the patient on meropenem. She is also receiving IV fluids, pain control, and antiemetics. She is being kept NPO. If she does not improve in the next 2-3 days, surgical intervention will be considered. Dr. Meridee Score to evaluate the patient's films for possible drainage of phlegmon. Oxycodone dose and frequency increased.   Left ear pain: No otoscopy available. Pt started on Flonase.  Hypokalemia: Resolved. Monitor.  AKI: Baseline creatinine appears to be 0.60- 0.67 range. Her creatinine was 1.11 on admission. This morning it was down to 0.54.  Monitor creatinine, electrolytes, and volume status. Avoid nephrotoxins and hypotension. All medications will be renally dosed as appropriate.  Transaminitis: Mild. Defer to GI.   Anemia: Will check anemia profile.   Hypoalbuminemia: Consider nutrition consult.   I have seen and examined this patient myself. I have spent 32 minutes in her evaluation and care.  DVT prophylaxis: Heparin Code Status: Full code Family Communication: Spouse is at bedside Disposition:  Status is: Inpatient  Remains inpatient appropriate because:IV treatments appropriate due to intensity of illness or inability to take PO   Dispo: The patient is from: Home              Anticipated d/c is to: Home              Anticipated d/c date is: 3 days              Patient currently is not medically stable to d/c. Colbey Wirtanen, DO [facility] Direct contact: see [email or web address]  7PM-7AM contact night coverage as above 08/19/2020, 3:02 PM  LOS: 1 day

## 2020-08-20 DIAGNOSIS — R1084 Generalized abdominal pain: Secondary | ICD-10-CM | POA: Diagnosis not present

## 2020-08-20 DIAGNOSIS — N179 Acute kidney failure, unspecified: Secondary | ICD-10-CM | POA: Diagnosis not present

## 2020-08-20 DIAGNOSIS — K859 Acute pancreatitis without necrosis or infection, unspecified: Secondary | ICD-10-CM | POA: Diagnosis not present

## 2020-08-20 DIAGNOSIS — D649 Anemia, unspecified: Secondary | ICD-10-CM | POA: Diagnosis not present

## 2020-08-20 LAB — CBC WITH DIFFERENTIAL/PLATELET
Abs Immature Granulocytes: 0.01 10*3/uL (ref 0.00–0.07)
Basophils Absolute: 0.1 10*3/uL (ref 0.0–0.1)
Basophils Relative: 1 %
Eosinophils Absolute: 0.5 10*3/uL (ref 0.0–0.5)
Eosinophils Relative: 10 %
HCT: 30.5 % — ABNORMAL LOW (ref 36.0–46.0)
Hemoglobin: 9.6 g/dL — ABNORMAL LOW (ref 12.0–15.0)
Immature Granulocytes: 0 %
Lymphocytes Relative: 36 %
Lymphs Abs: 1.9 10*3/uL (ref 0.7–4.0)
MCH: 29.4 pg (ref 26.0–34.0)
MCHC: 31.5 g/dL (ref 30.0–36.0)
MCV: 93.6 fL (ref 80.0–100.0)
Monocytes Absolute: 0.4 10*3/uL (ref 0.1–1.0)
Monocytes Relative: 8 %
Neutro Abs: 2.3 10*3/uL (ref 1.7–7.7)
Neutrophils Relative %: 45 %
Platelets: 411 10*3/uL — ABNORMAL HIGH (ref 150–400)
RBC: 3.26 MIL/uL — ABNORMAL LOW (ref 3.87–5.11)
RDW: 14.2 % (ref 11.5–15.5)
WBC: 5.3 10*3/uL (ref 4.0–10.5)
nRBC: 0 % (ref 0.0–0.2)

## 2020-08-20 LAB — COMPREHENSIVE METABOLIC PANEL
ALT: 18 U/L (ref 0–44)
AST: 16 U/L (ref 15–41)
Albumin: 2.5 g/dL — ABNORMAL LOW (ref 3.5–5.0)
Alkaline Phosphatase: 139 U/L — ABNORMAL HIGH (ref 38–126)
Anion gap: 13 (ref 5–15)
BUN: <5 mg/dL — ABNORMAL LOW (ref 6–20)
CO2: 23 mmol/L (ref 22–32)
Calcium: 8.8 mg/dL — ABNORMAL LOW (ref 8.9–10.3)
Chloride: 105 mmol/L (ref 98–111)
Creatinine, Ser: 0.69 mg/dL (ref 0.44–1.00)
GFR, Estimated: >60 mL/min (ref >60)
Glucose, Bld: 86 mg/dL (ref 70–99)
Potassium: 2.8 mmol/L — ABNORMAL LOW (ref 3.5–5.1)
Sodium: 141 mmol/L (ref 135–145)
Total Bilirubin: 0.3 mg/dL (ref 0.3–1.2)
Total Protein: 5.3 g/dL — ABNORMAL LOW (ref 6.5–8.1)

## 2020-08-20 LAB — PHOSPHORUS: Phosphorus: 3.4 mg/dL (ref 2.5–4.6)

## 2020-08-20 LAB — BASIC METABOLIC PANEL
Anion gap: 9 (ref 5–15)
BUN: <5 mg/dL — ABNORMAL LOW (ref 6–20)
CO2: 24 mmol/L (ref 22–32)
Calcium: 8.8 mg/dL — ABNORMAL LOW (ref 8.9–10.3)
Chloride: 105 mmol/L (ref 98–111)
Creatinine, Ser: 0.59 mg/dL (ref 0.44–1.00)
GFR, Estimated: >60 mL/min (ref >60)
Glucose, Bld: 84 mg/dL (ref 70–99)
Potassium: 3.6 mmol/L (ref 3.5–5.1)
Sodium: 138 mmol/L (ref 135–145)

## 2020-08-20 LAB — MAGNESIUM
Magnesium: 1.3 mg/dL — ABNORMAL LOW (ref 1.7–2.4)
Magnesium: 1.5 mg/dL — ABNORMAL LOW (ref 1.7–2.4)

## 2020-08-20 MED ORDER — COLESTIPOL HCL 1 G PO TABS
2.0000 g | ORAL_TABLET | Freq: Two times a day (BID) | ORAL | Status: DC
  Administered 2020-08-20 – 2020-08-26 (×13): 2 g via ORAL
  Filled 2020-08-20 (×16): qty 2

## 2020-08-20 MED ORDER — POTASSIUM CHLORIDE CRYS ER 20 MEQ PO TBCR
40.0000 meq | EXTENDED_RELEASE_TABLET | Freq: Once | ORAL | Status: AC
  Administered 2020-08-20: 40 meq via ORAL
  Filled 2020-08-20: qty 2

## 2020-08-20 MED ORDER — PANCRELIPASE (LIP-PROT-AMYL) 36000-114000 UNITS PO CPEP
36000.0000 [IU] | ORAL_CAPSULE | Freq: Three times a day (TID) | ORAL | Status: DC
  Administered 2020-08-20 – 2020-08-28 (×24): 36000 [IU] via ORAL
  Filled 2020-08-20 (×24): qty 1

## 2020-08-20 MED ORDER — MAGNESIUM SULFATE 4 GM/100ML IV SOLN
4.0000 g | Freq: Once | INTRAVENOUS | Status: AC
  Administered 2020-08-20: 4 g via INTRAVENOUS
  Filled 2020-08-20: qty 100

## 2020-08-20 MED ORDER — POTASSIUM CHLORIDE 10 MEQ/100ML IV SOLN
10.0000 meq | INTRAVENOUS | Status: AC
  Administered 2020-08-20 (×4): 10 meq via INTRAVENOUS
  Filled 2020-08-20 (×4): qty 100

## 2020-08-20 MED ORDER — COLESTIPOL HCL 1 G PO TABS
1.0000 g | ORAL_TABLET | Freq: Once | ORAL | Status: AC
  Administered 2020-08-20: 1 g via ORAL
  Filled 2020-08-20: qty 1

## 2020-08-20 NOTE — Progress Notes (Addendum)
PHARMACY - TOTAL PARENTERAL NUTRITION CONSULT NOTE  Indication:  Severe pancreatitis  Patient Measurements: Height: 5\' 7"  (170.2 cm) Weight: 58.4 kg (128 lb 12 oz) IBW/kg (Calculated) : 61.6 TPN AdjBW (KG): 58.4 Body mass index is 20.16 kg/m. Usual Weight: 61-64 kg last admit, now 58.4 kg  Assessment:  52 YOF recently admitted with necrotizing pancreatitis with infected pseudocyst s/p cystgastrostomy and then stent removal.  Patient returned on 08/15/20 with abdominal pain and progressive phlegmon in the LUQ.  Pharmacy consulted for TPN management as patient is refusing nasal jejunal feeds.  Patient endorses a 20-lb weight loss over the past 6 months.  Prior to this admission, she was eating at 25% capacity compared to before the initial onset of pancreatitis.  Glucose / Insulin: no hx DM, A1c 4.6% - AM glucose < 120 Electrolytes: K 2.8 (6mEq ordered), Mag 1.3, others WNL Renal: SCr < 1, BUN < 5 LFTs / TGs: alk phos down to 139, AST/ALT / tbili WNL Prealbumin / albumin: albumin 2.5, lipase 95 Intake / Output; MIVF: BM x5 on 11/28, NS at 100 ml/hr, net +5.3L GI Imaging: N/A Surgeries / Procedures: N/A  Central access: pending TPN start date: pending  Nutritional Goals (per RD rec on 08/16/20): 2000-2200 kCal, 105-120gm protein per day Goal TPN 90 ml/hr (53 g/L AA, 29 g/L ILE and 14% CHO)  Current Nutrition:  Clear liquid diet - tolerating per patient Boost TID - 3 charted given  Plan:  GI increasing colestipol as that appears to help, hoping to advance diet in AM and avoid TPN/EN Spoke to [facility], will hold off on placing PICC and starting TPN today F/U clinical progress and start TPN if indicated  Mag sulfate 4gm IV x 1 F/U AM labs  Laurice Kimmons D. Mina Marble, PharmD, BCPS, [place] 08/20/2020, 10:59 AM

## 2020-08-20 NOTE — Progress Notes (Signed)
PROGRESS NOTE  Tricia Brown GYI:[id number] DOB: 1975-10-07 DOA: 08/15/2020 PCP: [name], MD  Brief History    Tricia Brown is a 44 y.o. female with medical history significant of  Gerd, Necrotizing Pancreatitis. Pt was just discharged on 08/07/20 for her pancreatitis.  [facility] was consulted patient had an endoscopic ultrasound that showed extrinsic compression in the posterior wall of the stomach with multiple cysts identified in the pancreatic body and tail, patient had AXIOS cystogastrostomy performed, plastic pigtail stents were placed.  Repeat imaging did show improvement of the pseudocyst, and patient had removal of the stents on 08/02/2020.  Patient was also treated with antibiotics and patient was discharged on pancreatic enzymes, colestipol, PPI therapy with as needed nausea therapy with Zofran.  She was tolerating a diet when she was discharged while this is a heavy ever heard of axials stent.  Patient was to follow-up with GI on outpatient basis.  [facility] were consulted to admit the patient for further evaluation and treatment. GI was consulted on admission. She was admitted to a medical bed. She is NPO with IV fluids, IV antiemetics, and pain control. Abdominal CT has been reviewed by Dr. Meridee Score. The new formation is multilocular and not amenable to cyst gastrostomy at this time.  Patient wishes to avoid supplementing her nutrition with TPN or jejeunal feedings at this time. GI hs started her on pancreatinc enzymes and has adjusted her pain regimen.   Consultants  . Gastroenterology  Procedures  . None  Antibiotics   Anti-infectives (From admission, onward)   Start     Dose/Rate Route Frequency Ordered Stop   08/16/20 1530  meropenem (MERREM) 1 g in sodium chloride 0.9 % 100 mL IVPB        1 g 200 mL/hr over 30 Minutes Intravenous Every 8 hours 08/16/20 1444       Subjective  The patient is sitting up at bedside. She states that she is feeling  better.  Objective   Vitals:  Vitals:   08/19/20 2122 08/20/20 0505  BP: 127/80 135/79  Pulse: 90 79  Resp: 17 18  Temp: 98.4 F (36.9 C) (!) 97.3 F (36.3 C)  SpO2: 100% 100%   Exam:  Constitutional:  The patient is awake, alert, and oriented x 3. No acute distress. Respiratory:  . No increased work of breathing. . No wheezes, rales, or rhonchi . No tactile fremitus Cardiovascular:  . Regular rate and rhythm . No murmurs, ectopy, or gallups. . No lateral PMI. No thrills. Abdomen:  . Abdomen is slightly distended, and  tender . It is somewhat less erythematous  . No hernias, masses, or organomegaly . Positive bowel sounds.  Musculoskeletal:  . No cyanosis, clubbing, or edema Skin:  . No rashes, lesions, ulcers . palpation of skin: no induration or nodules . Mild jaundice Neurologic:  . CN 2-12 intact . Sensation all 4 extremities intact Psychiatric:  . Mental status o Mood, affect appropriate o Orientation to person, place, time  . judgment and insight appear intact  I have personally reviewed the following:   Today's Data  . Vitals, CMP, CBC  Micro Data  . Blood cultures pending  Imaging  . CT abdomen and pelvis: Loculated phlegmon, pancreatic pseudocyst, gastric wall thickening, mild bowel wall thickening at the splenic flexure, likely reactive due to adjacent pancreatitis and phlegmon, acute pancreatitis with interval removal of transgastric pseudocyst drain  Scheduled Meds: . chlorhexidine  15 mL Mouth Rinse BID  . colestipol  2  g Oral BID  . dicyclomine  10 mg Oral TID AC & HS  . feeding supplement  1 Container Oral TID BM  . fluticasone  2 spray Each Nare Daily  . heparin  5,000 Units Subcutaneous Q8H  . hydrocortisone  1 application Rectal BID  . lipase/protease/amylase  36,000 Units Oral TID WC  . mouth rinse  15 mL Mouth Rinse q12n4p  . multivitamin with minerals  1 tablet Oral Daily  . pantoprazole (PROTONIX) IV  40 mg Intravenous Q12H    . sodium chloride flush  10-40 mL Intracatheter Q12H   Continuous Infusions: . sodium chloride 100 mL/hr at 08/20/20 0700  . magnesium sulfate bolus IVPB    . meropenem (MERREM) IV 1 g (08/20/20 1478)    Principal Problem:   Abdominal pain Active Problems:   Pancreatitis   Hypokalemia   AKI (acute kidney injury) (HCC)   Transaminitis   Anemia   LOS: 5 days   A & P  Abdominal pain/ Pancreatitis: Now with development of a large phlegmon. I appreciate GI's assistance. They have started the patient on meropenem. She is also receiving IV fluids, pain control, and antiemetics. She is being kept NPO. If she does not improve in the next 2-3 days, surgical intervention will be considered. Dr. Meridee Score has determined that the fluid collections are not amenable to drainage at this point. Due to patient's ongoing pain and poor PO intake, consideration was given to jejeunal feeding tube vs TPN. As the patient seems to be feeling better today these options will not be pursued at this time. She has been started on pancreatic enzyme by general surgery.  Left ear pain: No otoscopy available. Pt started on Flonase.  Hypokalemia: Resolved. Monitor.  AKI: Baseline creatinine appears to be 0.60- 0.67 range. Her creatinine was 1.11 on admission. This morning it was down to 0.69.  Monitor creatinine, electrolytes, and volume status. Avoid nephrotoxins and hypotension. All medications will be renally dosed as appropriate.  Transaminitis: Mild. Defer to GI.   Anemia: Will check anemia profile.   Hypoalbuminemia: Consider nutrition consult.   I have seen and examined this patient myself. I have spent 30 minutes in her evaluation and care.  DVT prophylaxis: Heparin Code Status: Full code Family Communication: None availalbe Disposition:  Status is: Inpatient  Remains inpatient appropriate because:IV treatments appropriate due to intensity of illness or inability to take PO  Dispo: The patient  is from: Home              Anticipated d/c is to: Home              Anticipated d/c date is: 3 days              Patient currently is not medically stable to d/c. Nitesh Pitstick, DO [facility] Direct contact: see [email or web address]  7PM-7AM contact night coverage as above 08/20/2020, 3:19 PM  LOS: 1 day

## 2020-08-20 NOTE — Progress Notes (Signed)
Nutrition Follow-up  DOCUMENTATION CODES:   Not applicable  INTERVENTION:   -Continue MVI with minerals daily -Continue Boost Breeze po TID, each supplement provides 250 kcal and 9 grams of protein -RD will follow for diet advancement and adjust supplement regimen as appropriate -If prolonged NPO/ clear liquid diet status is anticipated, consider initiation of nutrition support (would favor TF over TPN to help preserve gut function/motility). Recommend:  Initiate Vital 1.5 @ 20 ml/hr and increase by 10 ml every 8 hours to goal rate of 60 ml/hr.   45 ml Prosource TF daily    Tube feeding regimen provides 2200 kcal (100% of needs), 108 grams of protein, and 1094 ml of H2O.   -If feedings are initiated, recommend monitor Mg, K, and Phos daily and replete as necessary, as pt is at high refeeding risk   NUTRITION DIAGNOSIS:   Increased nutrient needs related to acute illness (pancreatitis) as evidenced by estimated needs.  Ongoing  GOAL:   Patient will meet greater than or equal to 90% of their needs  Progressing   MONITOR:   PO intake, Supplement acceptance, Diet advancement, Labs, Weight trends, Skin, I & O's  REASON FOR ASSESSMENT:   Malnutrition Screening Tool    ASSESSMENT:   Tricia Brown is a 44 y.o. female with medical history significant of  Gerd, Necrotizing Pancreatitis. Pt was just discharged on 08/07/20 for her pancreatitis.  Tricia Brown was consulted patient had an endoscopic ultrasound that showed extrinsic compression in the posterior wall of the stomach with multiple cysts identified in the pancreatic body and tail, patient had AXIOS cystogastrostomy performed, plastic pigtail stents were placed.  Repeat imaging did show improvement of the pseudocyst, and patient had removal of the stents on 08/02/2020.  Patient was also treated with antibiotics and patient was discharged on pancreatic enzymes, colestipol, PPI therapy with as needed nausea therapy with Zofran.   She was tolerating a diet when she was discharged while this is a heavy ever heard of axials stent.  Patient was to follow-up with Brown on outpatient basis.  11/26- advanced to clear liquid diet  Reviewed I/O's: +2.9 L x 24 hours and +5.3 L since admission  Per Brown notes, hopeful to advance diet tomorrow. If pt unable to tolerate diet advancement, may consider nutrition support. Also considering endoscopic LUQ phlegmonous collection drainage vs surgical consult.   Pt on a clear liquid diet and consuming Boost Breeze supplements. If nutrition support is initiated, would favor enteral nutrition to help preserve gut motility as well as a semi-elemental formula to promote tolerance.   Medications reviewed and include creon and 0.9% sodium chloride infusion @ 100 ml/hr.   Labs reviewed: K: 2.8 (on IV supplementation), Mg: 1.3.   Diet Order:   Diet Order            Diet clear liquid Room service appropriate? Yes; Fluid consistency: Thin  Diet effective now                 EDUCATION NEEDS:   Education needs have been addressed  Skin:  Skin Assessment: Reviewed RN Assessment  Last BM:  08/20/20  Height:   Ht Readings from Last 1 Encounters:  08/15/20 5\' 7"  (1.702 m)    Weight:   Wt Readings from Last 1 Encounters:  08/15/20 58.4 kg    Ideal Body Weight:  61.4 kg  BMI:  Body mass index is 20.16 kg/m.  Estimated Nutritional Needs:   Kcal:  2000-2200  Protein:  105-120  grams  Fluid:  > 2 L   Tricia Brown, RD, LDN, CDCES Registered Dietitian II Certified Diabetes Care and Education Specialist Please refer to [facility] for RD and/or RD on-call/weekend/after hours pager

## 2020-08-20 NOTE — Progress Notes (Signed)
Tricia Brown 10:00 AM  Subjective: Patient doing better today looks better today he answered all of her questions and again discussed pancreatitis and her Bentyl helps and she will need pain clinic at home and she has no new complaints  Objective: No signs stable afebrile no acute distress abdomen is softer less tender labs okay  Assessment: Pancreatitis questionable etiology  Plan: Will increase Colestid since that seems to help and restart pancreatic enzymes and might consider trying a fentanyl patch and weaning her pain medicines as much as possible and if doing this well tomorrow we will try to advance her diet and see if we can avoid home TPN or nasal jejunal feeds  Tricia Brown E  office [phone] After 5PM or if no answer call [phone]

## 2020-08-21 DIAGNOSIS — N179 Acute kidney failure, unspecified: Secondary | ICD-10-CM | POA: Diagnosis not present

## 2020-08-21 DIAGNOSIS — E876 Hypokalemia: Secondary | ICD-10-CM | POA: Diagnosis not present

## 2020-08-21 DIAGNOSIS — K859 Acute pancreatitis without necrosis or infection, unspecified: Secondary | ICD-10-CM | POA: Diagnosis not present

## 2020-08-21 DIAGNOSIS — R1084 Generalized abdominal pain: Secondary | ICD-10-CM | POA: Diagnosis not present

## 2020-08-21 LAB — BASIC METABOLIC PANEL
Anion gap: 9 (ref 5–15)
BUN: <5 mg/dL — ABNORMAL LOW (ref 6–20)
CO2: 23 mmol/L (ref 22–32)
Calcium: 8.5 mg/dL — ABNORMAL LOW (ref 8.9–10.3)
Chloride: 109 mmol/L (ref 98–111)
Creatinine, Ser: 0.6 mg/dL (ref 0.44–1.00)
GFR, Estimated: >60 mL/min (ref >60)
Glucose, Bld: 86 mg/dL (ref 70–99)
Potassium: 3.2 mmol/L — ABNORMAL LOW (ref 3.5–5.1)
Sodium: 141 mmol/L (ref 135–145)

## 2020-08-21 LAB — MAGNESIUM: Magnesium: 2 mg/dL (ref 1.7–2.4)

## 2020-08-21 LAB — TRIGLYCERIDES: Triglycerides: 171 mg/dL — ABNORMAL HIGH (ref <150)

## 2020-08-21 MED ORDER — FLUCONAZOLE 150 MG PO TABS
150.0000 mg | ORAL_TABLET | Freq: Once | ORAL | Status: AC
  Administered 2020-08-21: 150 mg via ORAL
  Filled 2020-08-21: qty 1

## 2020-08-21 MED ORDER — PANTOPRAZOLE SODIUM 40 MG PO TBEC
40.0000 mg | DELAYED_RELEASE_TABLET | Freq: Two times a day (BID) | ORAL | Status: DC
  Administered 2020-08-21 – 2020-08-28 (×14): 40 mg via ORAL
  Filled 2020-08-21 (×14): qty 1

## 2020-08-21 MED ORDER — POTASSIUM CHLORIDE CRYS ER 20 MEQ PO TBCR
40.0000 meq | EXTENDED_RELEASE_TABLET | ORAL | Status: AC
  Administered 2020-08-21 (×2): 40 meq via ORAL
  Filled 2020-08-21 (×2): qty 2

## 2020-08-21 MED ORDER — ENSURE ENLIVE PO LIQD
237.0000 mL | Freq: Three times a day (TID) | ORAL | Status: DC
  Administered 2020-08-21 – 2020-08-23 (×3): 237 mL via ORAL

## 2020-08-21 MED ORDER — POTASSIUM CHLORIDE CRYS ER 20 MEQ PO TBCR
40.0000 meq | EXTENDED_RELEASE_TABLET | Freq: Two times a day (BID) | ORAL | Status: DC
  Administered 2020-08-21 – 2020-08-22 (×2): 40 meq via ORAL
  Filled 2020-08-21 (×2): qty 2

## 2020-08-21 NOTE — Progress Notes (Signed)
Nutrition Follow-up  DOCUMENTATION CODES:   Not applicable  INTERVENTION:   -Continue MVI with minerals daily -D/c Boost Breeze po TID, each supplement provides 250 kcal and 9 grams of protein -Ensure Enlive po TID, each supplement provides 350 kcal and 20 grams of protein -RD will follow for diet advancement and adjust supplement regimen as appropriate  NUTRITION DIAGNOSIS:   Increased nutrient needs related to acute illness (pancreatitis) as evidenced by estimated needs.  Ongoing  GOAL:   Patient will meet greater than or equal to 90% of their needs  Progressing   MONITOR:   PO intake, Supplement acceptance, Diet advancement, Labs, Weight trends, Skin, I & O's  REASON FOR ASSESSMENT:   Malnutrition Screening Tool    ASSESSMENT:   Francisco Eyerly is a 44 y.o. female with medical history significant of  Gerd, Necrotizing Pancreatitis. Pt was just discharged on 08/07/20 for her pancreatitis.  [facility] was consulted patient had an endoscopic ultrasound that showed extrinsic compression in the posterior wall of the stomach with multiple cysts identified in the pancreatic body and tail, patient had AXIOS cystogastrostomy performed, plastic pigtail stents were placed.  Repeat imaging did show improvement of the pseudocyst, and patient had removal of the stents on 08/02/2020.  Patient was also treated with antibiotics and patient was discharged on pancreatic enzymes, colestipol, PPI therapy with as needed nausea therapy with Zofran.  She was tolerating a diet when she was discharged while this is a heavy ever heard of axials stent.  Patient was to follow-up with GI on outpatient basis.  11/26- advanced to clear liquid diet 12/1- advanced to full liquid diet  Reviewed I/O's: +3.1 L x 24 hours and +8.4 L since admission  Per chart review, pt would like to hold off on TF and TPN at this time. Pt was just advanced to a full liquid diet. Pt amenable to Ensure supplements to help  optimize nutrition; RD to order.   Per GI notes, LUQ pseudocysts not amenable due to loculations. May need surgical consult if does not improve.    Labs reviewed: K: 3.2.   Diet Order:   Diet Order            Diet full liquid Room service appropriate? Yes; Fluid consistency: Thin  Diet effective now                 EDUCATION NEEDS:   Education needs have been addressed  Skin:  Skin Assessment: Reviewed RN Assessment  Last BM:  08/20/20  Height:   Ht Readings from Last 1 Encounters:  08/15/20 5\' 7"  (1.702 m)    Weight:   Wt Readings from Last 1 Encounters:  08/15/20 58.4 kg    Ideal Body Weight:  61.4 kg  BMI:  Body mass index is 20.16 kg/m.  Estimated Nutritional Needs:   Kcal:  2000-2200  Protein:  105-120 grams  Fluid:  > 2 L    08/17/20, RD, LDN, CDCES Registered Dietitian II Certified Diabetes Care and Education Specialist Please refer to [facility] for RD and/or RD on-call/weekend/after hours pager

## 2020-08-21 NOTE — Progress Notes (Signed)
PROGRESS NOTE    Tricia Brown  XFG:[id number] DOB: 1976/09/18 DOA: 08/15/2020 PCP: [name], MD    Brief Narrative:  Tricia Brown a 44 y.o.femalewith medical history significant ofGerd, Necrotizing Pancreatitis. Pt was just discharged on 11/17/21for her pancreatitis. [facility] was consulted patient had an endoscopic ultrasound that showed extrinsic compression in the posterior wall of the stomach with multiple cysts identified in the pancreatic body and tail, patient had AXIOS cystogastrostomyperformed, plastic pigtail stents were placed. Repeat imaging did show improvement of the pseudocyst, and patient had removal of the stents on 08/02/2020. Patient was also treated with antibiotics and patient was discharged on pancreatic enzymes, colestipol, PPI therapy with as needed nausea therapy with Zofran. She was tolerating a diet when she was discharged while this is a heavy ever heard of axials stent.Patient was to follow-up with GI on outpatient basis.  [facility] were consulted to admit the patient for further evaluation and treatment. GI was consulted on admission. She was admitted to a medical bed. She is NPO with IV fluids, IV antiemetics, and pain control. Abdominal CT has been reviewed by Dr. Meridee Score. The new formation is multilocular and not amenable to cyst gastrostomy at this time.  Patient wishes to avoid supplementing her nutrition with TPN or jejeunal feedings at this time. GI hs started her on pancreatinc enzymes and has adjusted her pain regimen.    Assessment & Plan:   Principal Problem:   Abdominal pain Active Problems:   Pancreatitis   Hypokalemia   AKI (acute kidney injury) (HCC)   Transaminitis   Anemia   Severe pancreatitis with loculated pseudocyst formation -Not amenable to cyst gastrostomy at this time -With supportive treatment, patient appears to be improving -Appreciate GI input -Started on pancreatic enzymes -Currently  on full liquid diet, advance as tolerated -Continue pain management  Acute kidney injury.  Patient presented with a creatinine of 1.1 on admission.  -Related to dehydration and volume loss -This is improved down to 0.6 with IV fluids.  Hypokalemia -Resolved  Hypoalbuminemia -Related to decreased p.o. intake -Nutrition following  Anemia -B12 and folate are not low -Iron studies indicate mild iron deficiency -Likely element of chronic disease from pancreatitis as well -Hemoglobin is currently stable, monitor   DVT prophylaxis: heparin injection 5,000 Units Start: 08/15/20 2200 SCDs Start: 08/15/20 1854  Code Status: Full code Family Communication: Discussed with patient Disposition Plan: Status is: Inpatient  Remains inpatient appropriate because:Ongoing active pain requiring inpatient pain management and IV treatments appropriate due to intensity of illness or inability to take PO   Dispo: The patient is from: Home              Anticipated d/c is to: Home              Anticipated d/c date is: 2 days              Patient currently is not medically stable to d/c.    Consultants:   [facility]  Procedures:     Antimicrobials:   Meropenem 11/26 >   Subjective: Overall, she is tolerating full liquids.  She is not having any vomiting, but does have some nausea.  Continues to have abdominal pain although she feels this is better.  Objective: Vitals:   08/20/20 0505 08/20/20 2040 08/21/20 0520 08/21/20 1345  BP: 135/79 133/85 135/86 (!) 141/88  Pulse: 79 97 86 80  Resp: 18 17 17 18   Temp: (!) 97.3 F (36.3 C) 97.7 F (36.5  C) (!) 97.5 F (36.4 C) 97.8 F (36.6 C)  TempSrc:      SpO2: 100% 100% 100% 100%  Weight:      Height:        Intake/Output Summary (Last 24 hours) at 08/21/2020 1857 Last data filed at 08/21/2020 1300 Gross per 24 hour  Intake 3207.62 ml  Output --  Net 3207.62 ml   Filed Weights   08/15/20 1325 08/15/20 1413 08/15/20 2104   Weight: 59.9 kg 59.9 kg 58.4 kg    Examination:  General exam: Appears calm and comfortable  Respiratory system: Clear to auscultation. Respiratory effort normal. Cardiovascular system: S1 & S2 heard, RRR. No JVD, murmurs, rubs, gallops or clicks. No pedal edema. Gastrointestinal system: Abdomen is nondistended, diffusely tender. No organomegaly or masses felt. Normal bowel sounds heard. Central nervous system: Alert and oriented. No focal neurological deficits. Extremities: Symmetric 5 x 5 power. Skin: No rashes, lesions or ulcers Psychiatry: Judgement and insight appear normal. Mood & affect appropriate.     Data Reviewed: I have personally reviewed following labs and imaging studies  CBC: Recent Labs  Lab 08/15/20 2144 08/16/20 0230 08/17/20 0946 08/18/20 0022 08/20/20 0413  WBC 13.3* 10.7* 8.9 10.2 5.3  NEUTROABS  --   --   --  7.6 2.3  HGB 11.1* 10.9* 10.8* 10.2* 9.6*  HCT 35.4* 34.3* 33.7* 32.3* 30.5*  MCV 96.5 96.1 94.1 93.9 93.6  PLT 408* 380 389 394 411*   Basic Metabolic Panel: Recent Labs  Lab 08/17/20 0946 08/18/20 0022 08/20/20 0413 08/20/20 1556 08/21/20 0416  NA 137 138 141 138 141  K 3.5 3.6 2.8* 3.6 3.2*  CL 107 108 105 105 109  CO2 21* 20* 23 24 23   GLUCOSE 111* 114* 86 84 86  BUN <5* <5* <5* <5* <5*  CREATININE 0.66 0.54 0.69 0.59 0.60  CALCIUM 8.8* 8.9 8.8* 8.8* 8.5*  MG  --   --  1.3* 1.5* 2.0  PHOS  --   --  3.4  --   --    GFR: Estimated Creatinine Clearance: 82.7 mL/min (by C-G formula based on SCr of 0.6 mg/dL). Liver Function Tests: Recent Labs  Lab 08/15/20 1335 08/16/20 0230 08/17/20 0946 08/18/20 0022 08/20/20 0413  AST 53* 20 13* 10* 16  ALT 73* 49* 32 27 18  ALKPHOS 159* 150* 164* 148* 139*  BILITOT 0.6 0.8 0.3 0.3 0.3  PROT 6.2* 5.4* 5.9* 5.9* 5.3*  ALBUMIN 2.9* 2.5* 2.7* 2.8* 2.5*   Recent Labs  Lab 08/15/20 1335 08/18/20 0022  LIPASE 52* 95*   No results for input(s): AMMONIA in the last 168  hours. Coagulation Profile: No results for input(s): INR, PROTIME in the last 168 hours. Cardiac Enzymes: No results for input(s): CKTOTAL, CKMB, CKMBINDEX, TROPONINI in the last 168 hours. BNP (last 3 results) No results for input(s): PROBNP in the last 8760 hours. HbA1C: No results for input(s): HGBA1C in the last 72 hours. CBG: No results for input(s): GLUCAP in the last 168 hours. Lipid Profile: Recent Labs    08/21/20 0416  TRIG 171*   Thyroid Function Tests: No results for input(s): TSH, T4TOTAL, FREET4, T3FREE, THYROIDAB in the last 72 hours. Anemia Panel: No results for input(s): VITAMINB12, FOLATE, FERRITIN, TIBC, IRON, RETICCTPCT in the last 72 hours. Sepsis Labs: Recent Labs  Lab 08/15/20 2144  LATICACIDVEN 0.6    Recent Results (from the past 240 hour(s))  Resp Panel by RT-PCR (Flu A&B, Covid) Nasopharyngeal Swab  Status: None   Collection Time: 08/15/20  6:05 PM   Specimen: Nasopharyngeal Swab; Nasopharyngeal(NP) swabs in vial transport medium  Result Value Ref Range Status   SARS Coronavirus 2 by RT PCR NEGATIVE NEGATIVE Final    Comment: (NOTE) SARS-CoV-2 target nucleic acids are NOT DETECTED.  The SARS-CoV-2 RNA is generally detectable in upper respiratory specimens during the acute phase of infection. The lowest concentration of SARS-CoV-2 viral copies this assay can detect is 138 copies/mL. A negative result does not preclude SARS-Cov-2 infection and should not be used as the sole basis for treatment or other patient management decisions. A negative result may occur with  improper specimen collection/handling, submission of specimen other than nasopharyngeal swab, presence of viral mutation(s) within the areas targeted by this assay, and inadequate number of viral copies(<138 copies/mL). A negative result must be combined with clinical observations, patient history, and epidemiological information. The expected result is Negative.  Fact Sheet for  Patients:  BloggerCourse.com  Fact Sheet for Healthcare Providers:  SeriousBroker.it  This test is no t yet approved or cleared by the [place] FDA and  has been authorized for detection and/or diagnosis of SARS-CoV-2 by FDA under an Emergency Use Authorization (EUA). This EUA will remain  in effect (meaning this test can be used) for the duration of the COVID-19 declaration under Section 564(b)(1) of the Act, 21 U.S.C.section 360bbb-3(b)(1), unless the authorization is terminated  or revoked sooner.       Influenza A by PCR NEGATIVE NEGATIVE Final   Influenza B by PCR NEGATIVE NEGATIVE Final    Comment: (NOTE) The Xpert Xpress SARS-CoV-2/FLU/RSV plus assay is intended as an aid in the diagnosis of influenza from Nasopharyngeal swab specimens and should not be used as a sole basis for treatment. Nasal washings and aspirates are unacceptable for Xpert Xpress SARS-CoV-2/FLU/RSV testing.  Fact Sheet for Patients: BloggerCourse.com  Fact Sheet for Healthcare Providers: SeriousBroker.it  This test is not yet approved or cleared by the [place] FDA and has been authorized for detection and/or diagnosis of SARS-CoV-2 by FDA under an Emergency Use Authorization (EUA). This EUA will remain in effect (meaning this test can be used) for the duration of the COVID-19 declaration under Section 564(b)(1) of the Act, 21 U.S.C. section 360bbb-3(b)(1), unless the authorization is terminated or revoked.  Performed at [facility], [address], [place], [place] [address]          Radiology Studies: No results found.      Scheduled Meds: . chlorhexidine  15 mL Mouth Rinse BID  . colestipol  2 g Oral BID  . dicyclomine  10 mg Oral TID AC & HS  . feeding supplement  237 mL Oral TID BM  . fluconazole  150 mg Oral Once  . fluticasone  2 spray Each Nare Daily  .  heparin  5,000 Units Subcutaneous Q8H  . hydrocortisone  1 application Rectal BID  . lipase/protease/amylase  36,000 Units Oral TID WC  . mouth rinse  15 mL Mouth Rinse q12n4p  . multivitamin with minerals  1 tablet Oral Daily  . pantoprazole  40 mg Oral Q12H  . sodium chloride flush  10-40 mL Intracatheter Q12H   Continuous Infusions: . sodium chloride 100 mL/hr at 08/21/20 0821  . meropenem (MERREM) IV 1 g (08/21/20 0833)     LOS: 6 days    Time spent:    Erick Blinks, MD [facility]   If 7PM-7AM, please contact night-coverage [email or web address]  08/21/2020, 6:57 PM

## 2020-08-21 NOTE — Progress Notes (Addendum)
[facility] Progress Note  Tricia Brown 44 y.o. 28-Jun-1976   Subjective: One episode of severe abdominal pain overnight requiring pain meds. Tolerating clear liquid diet. Loose stools reported.  Objective: Vital signs: Vitals:   08/20/20 2040 08/21/20 0520  BP: 133/85 135/86  Pulse: 97 86  Resp: 17 17  Temp: 97.7 F (36.5 C) (!) 97.5 F (36.4 C)  SpO2: 100% 100%    Physical Exam: Gen: alert, no acute distress, thin HEENT: anicteric sclera CV: RRR Chest: CTA B Abd: diffuse tenderness (greatest in LUQ) with guarding, soft, nondistended, +BS Ext: no edema  Lab Results: Recent Labs    08/20/20 0413 08/20/20 0413 08/20/20 1556 08/21/20 0416  NA 141   < > 138 141  K 2.8*   < > 3.6 3.2*  CL 105   < > 105 109  CO2 23   < > 24 23  GLUCOSE 86   < > 84 86  BUN <5*   < > <5* <5*  CREATININE 0.69   < > 0.59 0.60  CALCIUM 8.8*   < > 8.8* 8.5*  MG 1.3*   < > 1.5* 2.0  PHOS 3.4  --   --   --    < > = values in this interval not displayed.   Recent Labs    08/20/20 0413  AST 16  ALT 18  ALKPHOS 139*  BILITOT 0.3  PROT 5.3*  ALBUMIN 2.5*   Recent Labs    08/20/20 0413  WBC 5.3  NEUTROABS 2.3  HGB 9.6*  HCT 30.5*  MCV 93.6  PLT 411*      Assessment/Plan: Severe complicated pancreatitis with pseudocysts in LUQ. Not amenable to drainage if that was needed due to loculations. Hopefully, will continue to clinically improve because if not then would likely need evaluation at a tertiary care center by a pancreatic surgeon. Change to full liquid diet. Hold off on TPN consult for now. Continue Colestipol and Creon. Supportive care. Will follow.   [name] 08/21/2020, 9:13 AM  Questions please call [phone] ID: Tricia Brown, female   DOB: 1976/09/13, 44 y.o.   MRN: [id number]

## 2020-08-21 NOTE — Progress Notes (Signed)
PHARMACY - TOTAL PARENTERAL NUTRITION CONSULT NOTE  Indication:  Severe pancreatitis  Patient Measurements: Height: _0  (170.2 cm) Weight: 58.4 kg (128 lb 12 oz) IBW/kg (Calculated) : 61.6 TPN AdjBW (KG): 58.4 Body mass index is 20.16 kg/m. Usual Weight: 61-64 kg last admit, now 58.4 kg  Assessment:  47 YOF recently admitted with necrotizing pancreatitis with infected pseudocyst s/p cystgastrostomy and then stent removal.  Patient returned on 08/15/20 with abdominal pain and progressive phlegmon in the LUQ.  Pharmacy consulted for TPN management as patient is refusing nasal jejunal feeds.  Patient endorses a 20-lb weight loss over the past 6 months.  Prior to this admission, she was eating at 25% capacity compared to before the initial onset of pancreatitis.  Glucose / Insulin: no hx DM, A1c 4.6% - AM glucose < 120 Electrolytes: K 3.2 (65mq ordered), Mag 2, others WNL Renal: SCr < 1, BUN < 5 LFTs / TGs: alk phos down to 139, AST/ALT / tbili WNL / TG 171 Prealbumin / albumin: albumin 2.5, lipase 95 Intake / Output; MIVF: LBM 11/30, NS at 100 ml/hr, net +8.4L GI Imaging: N/A Surgeries / Procedures: N/A  Central access: pending TPN start date: pending  Nutritional Goals (per RD rec on 08/16/20): 2000-2200 kCal, 105-120gm protein per day Goal TPN 90 ml/hr (53 g/L AA, 29 g/L ILE and 14% CHO)  Current Nutrition:  Full liquid diet - tolerated CLD on 11/30 Boost TID - 3 charted given  Plan:  GI increased colestipol 11/30 and that appears to help, advanced diet to full liquids in hopes to avoid TPN/EN Spoke to Dr SMichail Sermon will hold off on placing PICC and starting TPN today F/U clinical progress and start TPN if indicated  KCl 40 meq PO q4h x2 F/U AM labs  Thank you for involving pharmacy in this patient's care.  JRenold Genta PharmD, BCPS Clinical Pharmacist Clinical phone for 08/21/2020 until 3p is [phone]/09/2019 7:25 AM  **Pharmacist phone directory can be found  on [place] listed under [place]*

## 2020-08-22 ENCOUNTER — Inpatient Hospital Stay: Payer: Self-pay

## 2020-08-22 DIAGNOSIS — N179 Acute kidney failure, unspecified: Secondary | ICD-10-CM | POA: Diagnosis not present

## 2020-08-22 DIAGNOSIS — R1084 Generalized abdominal pain: Secondary | ICD-10-CM | POA: Diagnosis not present

## 2020-08-22 DIAGNOSIS — K859 Acute pancreatitis without necrosis or infection, unspecified: Secondary | ICD-10-CM | POA: Diagnosis not present

## 2020-08-22 DIAGNOSIS — E876 Hypokalemia: Secondary | ICD-10-CM | POA: Diagnosis not present

## 2020-08-22 LAB — BASIC METABOLIC PANEL
Anion gap: 8 (ref 5–15)
BUN: <5 mg/dL — ABNORMAL LOW (ref 6–20)
CO2: 23 mmol/L (ref 22–32)
Calcium: 9.1 mg/dL (ref 8.9–10.3)
Chloride: 109 mmol/L (ref 98–111)
Creatinine, Ser: 0.57 mg/dL (ref 0.44–1.00)
GFR, Estimated: >60 mL/min (ref >60)
Glucose, Bld: 91 mg/dL (ref 70–99)
Potassium: 5 mmol/L (ref 3.5–5.1)
Sodium: 140 mmol/L (ref 135–145)

## 2020-08-22 LAB — CBC
HCT: 29.5 % — ABNORMAL LOW (ref 36.0–46.0)
Hemoglobin: 9.4 g/dL — ABNORMAL LOW (ref 12.0–15.0)
MCH: 29.7 pg (ref 26.0–34.0)
MCHC: 31.9 g/dL (ref 30.0–36.0)
MCV: 93.4 fL (ref 80.0–100.0)
Platelets: 464 10*3/uL — ABNORMAL HIGH (ref 150–400)
RBC: 3.16 MIL/uL — ABNORMAL LOW (ref 3.87–5.11)
RDW: 14.2 % (ref 11.5–15.5)
WBC: 8.4 10*3/uL (ref 4.0–10.5)
nRBC: 0 % (ref 0.0–0.2)

## 2020-08-22 LAB — MAGNESIUM: Magnesium: 1.8 mg/dL (ref 1.7–2.4)

## 2020-08-22 NOTE — Progress Notes (Signed)
Nutrition Follow-up  DOCUMENTATION CODES:   Not applicable  INTERVENTION:   -TPN management per pharmacy -RD will follow for diet advancement and add supplements as appropriate  NUTRITION DIAGNOSIS:   Increased nutrient needs related to acute illness (pancreatitis) as evidenced by estimated needs.  GOAL:   Patient will meet greater than or equal to 90% of their needs  MONITOR:   Diet advancement, Labs, Weight trends, Skin, I & O's  REASON FOR ASSESSMENT:   Malnutrition Screening Tool    ASSESSMENT:   Tricia Brown is a 44 y.o. female with medical history significant of  Gerd, Necrotizing Pancreatitis. Pt was just discharged on 08/07/20 for her pancreatitis.  [facility] was consulted patient had an endoscopic ultrasound that showed extrinsic compression in the posterior wall of the stomach with multiple cysts identified in the pancreatic body and tail, patient had AXIOS cystogastrostomy performed, plastic pigtail stents were placed.  Repeat imaging did show improvement of the pseudocyst, and patient had removal of the stents on 08/02/2020.  Patient was also treated with antibiotics and patient was discharged on pancreatic enzymes, colestipol, PPI therapy with as needed nausea therapy with Zofran.  She was tolerating a diet when she was discharged while this is a heavy ever heard of axials stent.  Patient was to follow-up with GI on outpatient basis.  11/26- advanced to clear liquid diet 12/1- advanced to full liquid diet  Reviewed I/O's: +3.4 L x 24 hours and +11.8 L since admission  Case discussed with Dr. Bosie Clos, who reports pt did not tolerate full liquid diet. Pt with increased abdominal pain and loose stools. MD requesting TPN initiated; RD discussed case with pharmacy. Pt is refusing NG feedings.   Per pharmacy note, plan for PICC placement today or tomorrow and to start TPN tomorrow.   Labs reviewed.   Diet Order:   Diet Order            Diet NPO time specified  Except for: Ice Chips, Sips with Meds  Diet effective now                 EDUCATION NEEDS:   Education needs have been addressed  Skin:  Skin Assessment: Reviewed RN Assessment  Last BM:  08/21/20  Height:   Ht Readings from Last 1 Encounters:  08/15/20 5\' 7"  (1.702 m)    Weight:   Wt Readings from Last 1 Encounters:  08/15/20 58.4 kg    Ideal Body Weight:  61.4 kg  BMI:  Body mass index is 20.16 kg/m.  Estimated Nutritional Needs:   Kcal:  2000-2200  Protein:  105-120 grams  Fluid:  > 2 L    08/17/20, RD, LDN, CDCES Registered Dietitian II Certified Diabetes Care and Education Specialist Please refer to [facility] for RD and/or RD on-call/weekend/after hours pager

## 2020-08-22 NOTE — Progress Notes (Signed)
PHARMACY - TOTAL PARENTERAL NUTRITION CONSULT NOTE  Indication:  Severe pancreatitis  Patient Measurements: Height: _0  (170.2 cm) Weight: 58.4 kg (128 lb 12 oz) IBW/kg (Calculated) : 61.6 TPN AdjBW (KG): 58.4 Body mass index is 20.16 kg/m. Usual Weight: 61-64 kg last admit, now 58.4 kg  Assessment:  70 YOF recently admitted with necrotizing pancreatitis with infected pseudocyst s/p cystgastrostomy and then stent removal.  Patient returned on 08/15/20 with abdominal pain and progressive phlegmon in the LUQ.  Pharmacy consulted for TPN management as patient is refusing nasal jejunal feeds and patient failed full liquid diet per Dr. Barkley Boards.  Patient endorses a 20-lb weight loss over the past 6 months.  Prior to this admission, she was eating at 25% capacity compared to before the initial onset of pancreatitis.  Glucose / Insulin: no hx DM, A1c 4.6% - AM glucose < 120 Electrolytes: K 5.0. Other electrolytes wnl. Renal: SCr < 1, BUN < 5 LFTs / TGs: Alk phos down to 139, AST/ALT / tbili WNL Prealbumin / albumin: albumin 2.5, lipase 95 Intake / Output; MIVF: BM x2 on 12/1.  GI Imaging: N/A Surgeries / Procedures: N/A  Central access: pending TPN start date: pending  Nutritional Goals (per RD rec on 08/16/20): 2000-2200 kCal, 105-120gm protein per day Goal TPN 90 ml/hr (53 g/L AA, 29 g/L ILE and 14% CHO)  Current Nutrition:  Boost TID -  Patient reports consuming 100% Full liquid diet - patient reported eating 50% on 12/1 and 75% on 12/2 Discussed with Dr. Michail Sermon who states patient failed trial of FLD - patient now NPO   Plan:  Discussed with Dr. Michail Sermon and plan to start TPN once access is obtained PICC consult placed but vascular team unable to confirm if PICC would be placed today and given cut off time will plan to start TPN tomorrow after PICC access is obtained.     Cristela Felt, PharmD Clinical Pharmacist  08/22/2020, 11:57 AM

## 2020-08-22 NOTE — Progress Notes (Signed)
PROGRESS NOTE    Tricia Brown  HUT:[id number] DOB: January 05, 1976 DOA: 08/15/2020 PCP: [name], MD    Brief Narrative:  Tricia Brown a 44 y.o.femalewith medical history significant ofGerd, Necrotizing Pancreatitis. Pt was just discharged on 11/17/21for her pancreatitis. [facility] was consulted patient had an endoscopic ultrasound that showed extrinsic compression in the posterior wall of the stomach with multiple cysts identified in the pancreatic body and tail, patient had AXIOS cystogastrostomyperformed, plastic pigtail stents were placed. Repeat imaging did show improvement of the pseudocyst, and patient had removal of the stents on 08/02/2020. Patient was also treated with antibiotics and patient was discharged on pancreatic enzymes, colestipol, PPI therapy with as needed nausea therapy with Zofran. She was tolerating a diet when she was discharged while this is a heavy ever heard of axials stent.Patient was to follow-up with GI on outpatient basis.  [facility] were consulted to admit the patient for further evaluation and treatment. GI was consulted on admission. She was admitted to a medical bed. She is NPO with IV fluids, IV antiemetics, and pain control. Abdominal CT has been reviewed by Dr. Meridee Score. The new formation is multilocular and not amenable to cyst gastrostomy at this time.  Patient wishes to avoid supplementing her nutrition with TPN or jejeunal feedings at this time. GI hs started her on pancreatinc enzymes and has adjusted her pain regimen.    Assessment & Plan:   Principal Problem:   Abdominal pain Active Problems:   Pancreatitis   Hypokalemia   AKI (acute kidney injury) (HCC)   Transaminitis   Anemia   Severe pancreatitis with loculated pseudocyst formation -Not amenable to cyst gastrostomy at this time -With supportive treatment, patient appears to be improving -Appreciate GI input -Started on pancreatic enzymes -patient was  on full liquid diet and had worsening of abdominal pain -Diet has been de escalated to NPO and patient will be started on TPN -Continue pain management  Acute kidney injury.  Patient presented with a creatinine of 1.1 on admission.  -Related to dehydration and volume loss -This is improved down to 0.6 with IV fluids.  Hypokalemia -Resolved  Hypoalbuminemia -Related to decreased p.o. intake -Nutrition following  Anemia -B12 and folate are not low -Iron studies indicate mild iron deficiency -Likely element of chronic disease from pancreatitis as well -Hemoglobin is currently stable, monitor   DVT prophylaxis: heparin injection 5,000 Units Start: 08/15/20 2200 SCDs Start: 08/15/20 1854  Code Status: Full code Family Communication: Discussed with patient Disposition Plan: Status is: Inpatient  Remains inpatient appropriate because:Ongoing active pain requiring inpatient pain management and IV treatments appropriate due to intensity of illness or inability to take PO   Dispo: The patient is from: Home              Anticipated d/c is to: Home              Anticipated d/c date is: 2 days              Patient currently is not medically stable to d/c.    Consultants:   [facility]  Procedures:     Antimicrobials:   Meropenem 11/26 >   Subjective: Reports worsening abdominal pain after having full liquids last night.  Objective: Vitals:   08/21/20 1345 08/21/20 2133 08/22/20 0552 08/22/20 1353  BP: (!) 141/88 121/74 131/84 133/72  Pulse: 80 91 80 84  Resp: 18 18 16 18   Temp: 97.8 F (36.6 C) 97.8 F (36.6 C) 98.1 F (  36.7 C) 98.2 F (36.8 C)  TempSrc:  Oral Oral   SpO2: 100% 98% 99% 100%  Weight:      Height:        Intake/Output Summary (Last 24 hours) at 08/22/2020 1843 Last data filed at 08/22/2020 1500 Gross per 24 hour  Intake 3179.53 ml  Output --  Net 3179.53 ml   Filed Weights   08/15/20 1325 08/15/20 1413 08/15/20 2104  Weight: 59.9 kg 59.9  kg 58.4 kg    Examination:  General exam: Alert, awake, oriented x 3 Respiratory system: Clear to auscultation. Respiratory effort normal. Cardiovascular system:RRR. No murmurs, rubs, gallops. Gastrointestinal system: Abdomen is nondistended, diffusely tender. No organomegaly or masses felt. Normal bowel sounds heard. Central nervous system: Alert and oriented. No focal neurological deficits. Extremities: No C/C/E, +pedal pulses Skin: No rashes, lesions or ulcers Psychiatry: Judgement and insight appear normal. Mood & affect appropriate.    Data Reviewed: I have personally reviewed following labs and imaging studies  CBC: Recent Labs  Lab 08/16/20 0230 08/17/20 0946 08/18/20 0022 08/20/20 0413 08/22/20 0339  WBC 10.7* 8.9 10.2 5.3 8.4  NEUTROABS  --   --  7.6 2.3  --   HGB 10.9* 10.8* 10.2* 9.6* 9.4*  HCT 34.3* 33.7* 32.3* 30.5* 29.5*  MCV 96.1 94.1 93.9 93.6 93.4  PLT 380 389 394 411* 464*   Basic Metabolic Panel: Recent Labs  Lab 08/18/20 0022 08/20/20 0413 08/20/20 1556 08/21/20 0416 08/22/20 0339  NA 138 141 138 141 140  K 3.6 2.8* 3.6 3.2* 5.0  CL 108 105 105 109 109  CO2 20* 23 24 23 23   GLUCOSE 114* 86 84 86 91  BUN <5* <5* <5* <5* <5*  CREATININE 0.54 0.69 0.59 0.60 0.57  CALCIUM 8.9 8.8* 8.8* 8.5* 9.1  MG  --  1.3* 1.5* 2.0 1.8  PHOS  --  3.4  --   --   --    GFR: Estimated Creatinine Clearance: 82.7 mL/min (by C-G formula based on SCr of 0.57 mg/dL). Liver Function Tests: Recent Labs  Lab 08/16/20 0230 08/17/20 0946 08/18/20 0022 08/20/20 0413  AST 20 13* 10* 16  ALT 49* 32 27 18  ALKPHOS 150* 164* 148* 139*  BILITOT 0.8 0.3 0.3 0.3  PROT 5.4* 5.9* 5.9* 5.3*  ALBUMIN 2.5* 2.7* 2.8* 2.5*   Recent Labs  Lab 08/18/20 0022  LIPASE 95*   No results for input(s): AMMONIA in the last 168 hours. Coagulation Profile: No results for input(s): INR, PROTIME in the last 168 hours. Cardiac Enzymes: No results for input(s): CKTOTAL, CKMB,  CKMBINDEX, TROPONINI in the last 168 hours. BNP (last 3 results) No results for input(s): PROBNP in the last 8760 hours. HbA1C: No results for input(s): HGBA1C in the last 72 hours. CBG: No results for input(s): GLUCAP in the last 168 hours. Lipid Profile: Recent Labs    08/21/20 0416  TRIG 171*   Thyroid Function Tests: No results for input(s): TSH, T4TOTAL, FREET4, T3FREE, THYROIDAB in the last 72 hours. Anemia Panel: No results for input(s): VITAMINB12, FOLATE, FERRITIN, TIBC, IRON, RETICCTPCT in the last 72 hours. Sepsis Labs: Recent Labs  Lab 08/15/20 2144  LATICACIDVEN 0.6    Recent Results (from the past 240 hour(s))  Resp Panel by RT-PCR (Flu A&B, Covid) Nasopharyngeal Swab     Status: None   Collection Time: 08/15/20  6:05 PM   Specimen: Nasopharyngeal Swab; Nasopharyngeal(NP) swabs in vial transport medium  Result Value Ref Range Status  SARS Coronavirus 2 by RT PCR NEGATIVE NEGATIVE Final    Comment: (NOTE) SARS-CoV-2 target nucleic acids are NOT DETECTED.  The SARS-CoV-2 RNA is generally detectable in upper respiratory specimens during the acute phase of infection. The lowest concentration of SARS-CoV-2 viral copies this assay can detect is 138 copies/mL. A negative result does not preclude SARS-Cov-2 infection and should not be used as the sole basis for treatment or other patient management decisions. A negative result may occur with  improper specimen collection/handling, submission of specimen other than nasopharyngeal swab, presence of viral mutation(s) within the areas targeted by this assay, and inadequate number of viral copies(<138 copies/mL). A negative result must be combined with clinical observations, patient history, and epidemiological information. The expected result is Negative.  Fact Sheet for Patients:  BloggerCourse.com  Fact Sheet for Healthcare Providers:  SeriousBroker.it  This  test is no t yet approved or cleared by the [place] FDA and  has been authorized for detection and/or diagnosis of SARS-CoV-2 by FDA under an Emergency Use Authorization (EUA). This EUA will remain  in effect (meaning this test can be used) for the duration of the COVID-19 declaration under Section 564(b)(1) of the Act, 21 U.S.C.section 360bbb-3(b)(1), unless the authorization is terminated  or revoked sooner.       Influenza A by PCR NEGATIVE NEGATIVE Final   Influenza B by PCR NEGATIVE NEGATIVE Final    Comment: (NOTE) The Xpert Xpress SARS-CoV-2/FLU/RSV plus assay is intended as an aid in the diagnosis of influenza from Nasopharyngeal swab specimens and should not be used as a sole basis for treatment. Nasal washings and aspirates are unacceptable for Xpert Xpress SARS-CoV-2/FLU/RSV testing.  Fact Sheet for Patients: BloggerCourse.com  Fact Sheet for Healthcare Providers: SeriousBroker.it  This test is not yet approved or cleared by the [place] FDA and has been authorized for detection and/or diagnosis of SARS-CoV-2 by FDA under an Emergency Use Authorization (EUA). This EUA will remain in effect (meaning this test can be used) for the duration of the COVID-19 declaration under Section 564(b)(1) of the Act, 21 U.S.C. section 360bbb-3(b)(1), unless the authorization is terminated or revoked.  Performed at [facility], [address], [place], [place] [address]          Radiology Studies: [place] EKG SITE RITE  Result Date: 08/22/2020 If Site Rite image not attached, placement could not be confirmed due to current cardiac rhythm.       Scheduled Meds:  chlorhexidine  15 mL Mouth Rinse BID   colestipol  2 g Oral BID   dicyclomine  10 mg Oral TID AC & HS   feeding supplement  237 mL Oral TID BM   fluticasone  2 spray Each Nare Daily   heparin  5,000 Units Subcutaneous Q8H    lipase/protease/amylase  36,000 Units Oral TID WC   mouth rinse  15 mL Mouth Rinse q12n4p   multivitamin with minerals  1 tablet Oral Daily   pantoprazole  40 mg Oral Q12H   sodium chloride flush  10-40 mL Intracatheter Q12H   Continuous Infusions:  sodium chloride 100 mL/hr at 08/22/20 0700   meropenem (MERREM) IV 1 g (08/22/20 1510)     LOS: 7 days    Time spent:    Erick Blinks, MD [facility]   If 7PM-7AM, please contact night-coverage [email or web address]  08/22/2020, 6:43 PM

## 2020-08-22 NOTE — Progress Notes (Signed)
Spoke with IV Pharmacist regarding PICC for TPN. Due to PICC order in at 1248; pharmacist will delay TPN until 08-23-20.

## 2020-08-22 NOTE — Progress Notes (Signed)
Spoke to IV team nurse, will place PICC line tonight. Notified patient, and agreed.

## 2020-08-22 NOTE — Progress Notes (Signed)
[facility] Gastroenterology Progress Note  Tricia Brown 44 y.o. Jan 31, 1976   Subjective: Increased abdominal pain overnight requiring IV pain meds. Increased abdominal pain on full liquid diet. Ongoing loose stools. Frustrated with worsened pain overnight and tearful at times. Stepmother in room.  Objective: Vital signs: Vitals:   08/21/20 2133 08/22/20 0552  BP: 121/74 131/84  Pulse: 91 80  Resp: 18 16  Temp: 97.8 F (36.6 C) 98.1 F (36.7 C)  SpO2: 98% 99%    Physical Exam: Gen: alert, tearful, thin HEENT: anicteric sclera CV: RRR Chest: CTA B Abd: LUQ and epigastric tenderness with guarding, soft, nondistended, +BS  Ext: no edema  Lab Results: Recent Labs    08/20/20 0413 08/20/20 1556 08/21/20 0416 08/22/20 0339  NA 141   < > 141 140  K 2.8*   < > 3.2* 5.0  CL 105   < > 109 109  CO2 23   < > 23 23  GLUCOSE 86   < > 86 91  BUN <5*   < > <5* <5*  CREATININE 0.69   < > 0.60 0.57  CALCIUM 8.8*   < > 8.5* 9.1  MG 1.3*   < > 2.0 1.8  PHOS 3.4  --   --   --    < > = values in this interval not displayed.   Recent Labs    08/20/20 0413  AST 16  ALT 18  ALKPHOS 139*  BILITOT 0.3  PROT 5.3*  ALBUMIN 2.5*   Recent Labs    08/20/20 0413 08/22/20 0339  WBC 5.3 8.4  NEUTROABS 2.3  --   HGB 9.6* 9.4*  HCT 30.5* 29.5*  MCV 93.6 93.4  PLT 411* 464*      Assessment/Plan: Severe complicated pancreatitis and loculated pseudocysts not amenable to drainage. Worsened abdominal pain overnight on full liquids and doubt she will be able to tolerate enough oral nutrition anytime soon. PICC line consult and TPN to allow her to go home in the near future if her abdominal pain improves. May need an updated CT scan early next week or sooner depending on her symptoms. Continue Colestipol for diarrhea but may need stool studies if it worsens or persists. If no improvement clinically, then will need referral to [facility] surgery as outpt. Will follow.   [name] 08/22/2020, 11:43 AM  Questions please call [phone] ID: Tricia Brown, female   DOB: Sep 12, 1976, 44 y.o.   MRN: [id number]

## 2020-08-23 DIAGNOSIS — R1084 Generalized abdominal pain: Secondary | ICD-10-CM | POA: Diagnosis not present

## 2020-08-23 DIAGNOSIS — E876 Hypokalemia: Secondary | ICD-10-CM | POA: Diagnosis not present

## 2020-08-23 DIAGNOSIS — K859 Acute pancreatitis without necrosis or infection, unspecified: Secondary | ICD-10-CM | POA: Diagnosis not present

## 2020-08-23 DIAGNOSIS — N179 Acute kidney failure, unspecified: Secondary | ICD-10-CM | POA: Diagnosis not present

## 2020-08-23 LAB — COMPREHENSIVE METABOLIC PANEL
ALT: 21 U/L (ref 0–44)
AST: 20 U/L (ref 15–41)
Albumin: 2.7 g/dL — ABNORMAL LOW (ref 3.5–5.0)
Alkaline Phosphatase: 174 U/L — ABNORMAL HIGH (ref 38–126)
Anion gap: 10 (ref 5–15)
BUN: <5 mg/dL — ABNORMAL LOW (ref 6–20)
CO2: 28 mmol/L (ref 22–32)
Calcium: 9.4 mg/dL (ref 8.9–10.3)
Chloride: 102 mmol/L (ref 98–111)
Creatinine, Ser: 0.64 mg/dL (ref 0.44–1.00)
GFR, Estimated: >60 mL/min (ref >60)
Glucose, Bld: 87 mg/dL (ref 70–99)
Potassium: 4.3 mmol/L (ref 3.5–5.1)
Sodium: 140 mmol/L (ref 135–145)
Total Bilirubin: 0.4 mg/dL (ref 0.3–1.2)
Total Protein: 5.5 g/dL — ABNORMAL LOW (ref 6.5–8.1)

## 2020-08-23 LAB — GLUCOSE, CAPILLARY
Glucose-Capillary: 110 mg/dL — ABNORMAL HIGH (ref 70–99)
Glucose-Capillary: 96 mg/dL (ref 70–99)

## 2020-08-23 LAB — PHOSPHORUS: Phosphorus: 2.9 mg/dL (ref 2.5–4.6)

## 2020-08-23 LAB — MAGNESIUM: Magnesium: 1.8 mg/dL (ref 1.7–2.4)

## 2020-08-23 MED ORDER — SODIUM CHLORIDE 0.9% FLUSH
10.0000 mL | Freq: Two times a day (BID) | INTRAVENOUS | Status: DC
  Administered 2020-08-23 – 2020-08-26 (×6): 10 mL

## 2020-08-23 MED ORDER — INSULIN ASPART 100 UNIT/ML ~~LOC~~ SOLN: 0.0000 [IU] | SUBCUTANEOUS | Status: DC

## 2020-08-23 MED ORDER — BOOST / RESOURCE BREEZE PO LIQD CUSTOM
1.0000 | Freq: Three times a day (TID) | ORAL | Status: DC
  Administered 2020-08-23 – 2020-08-28 (×14): 1 via ORAL

## 2020-08-23 MED ORDER — STERILE WATER FOR INJECTION IV SOLN
INTRAVENOUS | Status: AC
  Filled 2020-08-23: qty 320

## 2020-08-23 MED ORDER — SODIUM CHLORIDE 0.9% FLUSH: 10.0000 mL | INTRAVENOUS | Status: DC | PRN

## 2020-08-23 MED ORDER — CHLORHEXIDINE GLUCONATE CLOTH 2 % EX PADS
6.0000 | MEDICATED_PAD | Freq: Every day | CUTANEOUS | Status: DC
  Administered 2020-08-23 – 2020-08-28 (×6): 6 via TOPICAL

## 2020-08-23 NOTE — Progress Notes (Signed)
[facility] Gastroenterology Progress Note  Dakota Stangl 44 y.o. 1975-11-03   Subjective: Better night last night with less abdominal pain. Still having diarrhea. S/P PICC.  Objective: Vital signs: Vitals:   08/22/20 1936 08/23/20 0451  BP: 123/80 (!) 144/89  Pulse: 100 91  Resp: 17 17  Temp: 97.7 F (36.5 C) (!) 97.5 F (36.4 C)  SpO2: 100% 100%    Physical Exam: Gen: alert, no acute distress  HEENT: anicteric sclera CV: RRR Chest: CTA B Abd: decreased tenderness diffusely (greatest in LUQ) with guarding, soft, nondistended, +BS Ext: no edema  Lab Results: Recent Labs    08/22/20 0339 08/23/20 0507  NA 140 140  K 5.0 4.3  CL 109 102  CO2 23 28  GLUCOSE 91 87  BUN <5* <5*  CREATININE 0.57 0.64  CALCIUM 9.1 9.4  MG 1.8 1.8  PHOS  --  2.9   Recent Labs    08/23/20 0507  AST 20  ALT 21  ALKPHOS 174*  BILITOT 0.4  PROT 5.5*  ALBUMIN 2.7*   Recent Labs    08/22/20 0339  WBC 8.4  HGB 9.4*  HCT 29.5*  MCV 93.4  PLT 464*      Assessment/Plan: Complicated pancreatitis with loculated pseudocysts. PICC line placed to start TPN. Patient did not want to do nasojejunal tube feeds that she had last admission so TPN needed since she was not able to tolerate adequate oral nutrition. Continue Colestipol. Continue supportive care.   [name] 08/23/2020, 9:30 AM  Questions please call [phone] ID: [name], female   DOB: Feb 18, 1976, 44 y.o.   MRN: [id number]

## 2020-08-23 NOTE — TOC Initial Note (Addendum)
Transition of Care [facility]) - Initial/Assessment Note    Patient Details  Name: Tricia Brown MRN: [id number] Date of Birth: 06-28-1976  Transition of Care [facility]) CM/SW Contact:    Kingsley Plan, RN Phone Number: 08/23/2020, 1:03 PM  Clinical Narrative:                 Spoke to patient and her father at bedside.   Confirmed face sheet information.   Discussed home TPN. Patient will have [employer] for infusion company , [name] will come and provide education to patient and a support person prior to discharge.   Patient will have a HHRN but the nurse will not be there daily. Patient and father voiced understanding.  Patient lives with her son [name] [phone] and his girl friend [name]. Patient's cell is currently turned off.  NCM exhausted home health medicare.gov list for patient's area. [name] will arrange [facility]   [name] with [facility] has arranged HHRN through [facility] , start of care date is Sunday December 5 Expected Discharge Plan: Home w Home Health Services Barriers to Discharge: Continued Medical Work up   Patient Goals and CMS Choice Patient states their goals for this hospitalization and ongoing recovery are:: to return to home CMS Medicare.gov Compare Post Acute Care list provided to:: Patient Choice offered to / list presented to : Patient  Expected Discharge Plan and Services Expected Discharge Plan: Home w Home Health Services   Discharge Planning Services: CM Consult Post Acute Care Choice: Home Health Living arrangements for the past 2 months: Single Family Home                 DME Arranged: N/A DME Agency: NA       HH Arranged: RN          Prior Living Arrangements/Services Living arrangements for the past 2 months: Single Family Home Lives with:: Adult Children Patient language and need for interpreter reviewed:: Yes        Need for Family Participation in Patient Care: Yes (Comment) Care  giver support system in place?: Yes (comment)   Criminal Activity/Legal Involvement Pertinent to Current Situation/Hospitalization: No - Comment as needed  Activities of Daily Living Home Assistive Devices/Equipment: None ADL Screening (condition at time of admission) Patient's cognitive ability adequate to safely complete daily activities?: Yes Is the patient deaf or have difficulty hearing?: No Does the patient have difficulty seeing, even when wearing glasses/contacts?: No Does the patient have difficulty concentrating, remembering, or making decisions?: No Patient able to express need for assistance with ADLs?: Yes Does the patient have difficulty dressing or bathing?: No Independently performs ADLs?: Yes (appropriate for developmental age) Does the patient have difficulty walking or climbing stairs?: No Weakness of Legs: None Weakness of Arms/Hands: None  Permission Sought/Granted   Permission granted to share information with : Yes, Verbal Permission Granted  Share Information with NAME: father [name]           Emotional Assessment Appearance:: Appears stated age Attitude/Demeanor/Rapport: Engaged Affect (typically observed): Accepting Orientation: : Oriented to Self, Oriented to Place, Oriented to  Time, Oriented to Situation Alcohol / Substance Use: Not Applicable Psych Involvement: No (comment)  Admission diagnosis:  Abdominal pain [R10.9] Acute pancreatitis, unspecified complication status, unspecified pancreatitis type [K85.90] Patient Active Problem List   Diagnosis Date Noted  . AKI (acute kidney injury) (HCC) 08/15/2020  . Transaminitis 08/15/2020  . Anemia 08/15/2020  . Abdominal  pain 08/15/2020  . Pseudocyst of pancreas   . Gastritis and gastroduodenitis   . Pancreatitis 07/10/2020  . Hypokalemia 07/10/2020  . Opioid use disorder, severe, dependence (HCC) 02/08/2019  . MDD (major depressive disorder), recurrent episode, severe (HCC) 02/02/2019  .  Pelvic pain in female 09/08/2011  . Endometriosis   . Dysplasia   . Depression   . IBS (irritable bowel syndrome)    PCP:  [name], MD Pharmacy:   [facility] [phone] [place], [place] - [address] [place] [address] Phone: [phone] Fax: [phone]  [id number] - [place], [place] - [address] AT [address] [facility] [address] [place] [address] Phone: [phone] Fax: [phone]     Social Determinants of Health (SDOH) Interventions    Readmission Risk Interventions No flowsheet data found.

## 2020-08-23 NOTE — Progress Notes (Signed)
Nutrition Follow-up  DOCUMENTATION CODES:   Not applicable  INTERVENTION:   -TPN management per pharmacy -D/c Ensure Enlive po BID, each supplement provides 350 kcal and 20 grams of protein -Boost Breeze po TID, each supplement provides 250 kcal and 9 grams of protein -D/c MVI with minerals daily -RD will follow for diet advancement and adjust supplements regimen as appropriate  NUTRITION DIAGNOSIS:   Increased nutrient needs related to acute illness (pancreatitis) as evidenced by estimated needs.  Ongoing  GOAL:   Patient will meet greater than or equal to 90% of their needs  Progressing   MONITOR:   Diet advancement, Labs, Weight trends, Skin, I & O's  REASON FOR ASSESSMENT:   Malnutrition Screening Tool    ASSESSMENT:   Dilan Fullenwider is a 44 y.o. female with medical history significant of  Gerd, Necrotizing Pancreatitis. Pt was just discharged on 08/07/20 for her pancreatitis.  [facility] was consulted patient had an endoscopic ultrasound that showed extrinsic compression in the posterior wall of the stomach with multiple cysts identified in the pancreatic body and tail, patient had AXIOS cystogastrostomy performed, plastic pigtail stents were placed.  Repeat imaging did show improvement of the pseudocyst, and patient had removal of the stents on 08/02/2020.  Patient was also treated with antibiotics and patient was discharged on pancreatic enzymes, colestipol, PPI therapy with as needed nausea therapy with Zofran.  She was tolerating a diet when she was discharged while this is a heavy ever heard of axials stent.  Patient was to follow-up with GI on outpatient basis.  11/26- advanced to clear liquid diet 12/1- advanced to full liquid diet 12/2- downgraded to clear liquid diet 12/3- PICC placed, TPN initiated  Reviewed I/O's: +240 ml x 24 hours and +12.1 L since admission  Per MD notes, abdominal pain is improving and remains with diarrhea. TPN to be initiated today, as  pt is refusing enteral feeds.   Per pharmacy note, plan to initiateTPN at 40 ml/hr, which provides 969 kcals and 48 grams protein. Regimen to provide 48% of estimated kcal needs and 46% of estimated protein needs.   Medications reviewed and include creon and 0.9% sodium chloride infusion @ 100 ml/hr.   Labs reviewed. Inpatient orders for glycemic control are 0-9 units insulin aspart every 4 hours.   Diet Order:   Diet Order            Diet clear liquid Room service appropriate? Yes; Fluid consistency: Thin  Diet effective now                 EDUCATION NEEDS:   Education needs have been addressed  Skin:  Skin Assessment: Reviewed RN Assessment  Last BM:  08/22/20  Height:   Ht Readings from Last 1 Encounters:  08/15/20 5\' 7"  (1.702 m)    Weight:   Wt Readings from Last 1 Encounters:  08/15/20 58.4 kg    Ideal Body Weight:  61.4 kg  BMI:  Body mass index is 20.16 kg/m.  Estimated Nutritional Needs:   Kcal:  2000-2200  Protein:  105-120 grams  Fluid:  > 2 L    08/17/20, RD, LDN, CDCES Registered Dietitian II Certified Diabetes Care and Education Specialist Please refer to [facility] for RD and/or RD on-call/weekend/after hours pager

## 2020-08-23 NOTE — Progress Notes (Signed)
Request received to disconnect IVF from PICC for patient to shower.  No order from MD to cover this.  Discussed with the bedside nurse the increase risk for infection with disconnecting and reconnecting PICC for this reason.  Increased risk for infection and catheter dislodgement as well if PICC dressing gets wet.  Bedside nurse to discuss this with the patient and request canceled.

## 2020-08-23 NOTE — Progress Notes (Addendum)
PROGRESS NOTE    Tricia Brown  YTK:[id number] DOB: [birth year] DOA: 08/15/2020 PCP: [name], MD    Brief Narrative:  Tricia Brown a 44 y.o.femalewith medical history significant ofGerd, Necrotizing Pancreatitis. Pt was just discharged on 11/17/21for her pancreatitis. [facility] was consulted patient had an endoscopic ultrasound that showed extrinsic compression in the posterior wall of the stomach with multiple cysts identified in the pancreatic body and tail, patient had AXIOS cystogastrostomyperformed, plastic pigtail stents were placed. Repeat imaging did show improvement of the pseudocyst, and patient had removal of the stents on 08/02/2020. Patient was also treated with antibiotics and patient was discharged on pancreatic enzymes, colestipol, PPI therapy with as needed nausea therapy with Zofran. She was tolerating a diet when she was discharged while this is a heavy ever heard of axials stent.Patient was to follow-up with GI on outpatient basis.  [facility] were consulted to admit the patient for further evaluation and treatment. GI was consulted on admission. She was admitted to a medical bed. She is NPO with IV fluids, IV antiemetics, and pain control. Abdominal CT has been reviewed by Dr. Meridee Score. The new formation is multilocular and not amenable to cyst gastrostomy at this time.  Patient wishes to avoid supplementing her nutrition with TPN or jejeunal feedings at this time. GI hs started her on pancreatinc enzymes and has adjusted her pain regimen.    Assessment & Plan:   Principal Problem:   Abdominal pain Active Problems:   Pancreatitis   Hypokalemia   AKI (acute kidney injury) (HCC)   Transaminitis   Anemia   Severe pancreatitis with loculated pseudocyst formation -Not amenable to cyst gastrostomy at this time -With supportive treatment, patient appears to be improving -Appreciate GI input -Started on pancreatic enzymes -discontinue  meropenem since she has been afebrile and stable -Did not tolerate full liquids, so started on clears and TPN -Pain remains uncontrolled and she is requiring IV pain medications.  Acute kidney injury.  Patient presented with a creatinine of 1.1 on admission.  -Related to dehydration and volume loss -This is improved down to 0.6 with IV fluids.  Hypokalemia -Resolved  Hypoalbuminemia -Related to decreased p.o. intake -Nutrition following  Anemia -B12 and folate are not low -Iron studies indicate mild iron deficiency -Likely element of chronic disease from pancreatitis as well -Hemoglobin is currently stable, monitor   DVT prophylaxis: heparin injection 5,000 Units Start: 08/15/20 2200 SCDs Start: 08/15/20 1854  Code Status: Full code Family Communication: Discussed with patient Disposition Plan: Status is: Inpatient  Remains inpatient appropriate because:Ongoing active pain requiring inpatient pain management and IV treatments appropriate due to intensity of illness or inability to take PO   Dispo: The patient is from: Home              Anticipated d/c is to: Home              Anticipated d/c date is: 2 days              Patient currently is not medically stable to d/c.    Consultants:   [facility]  Procedures:   PICC placement 08/23/20  Antimicrobials:   Meropenem 11/26 >12/3   Subjective: Continues to have abdominal pain.   Objective: Vitals:   08/22/20 1353 08/22/20 1936 08/23/20 0451 08/23/20 1507  BP: 133/72 123/80 (!) 144/89 126/68  Pulse: 84 100 91 98  Resp: 18 17 17 18   Temp: 98.2 F (36.8 C) 97.7 F (36.5 C) (!) 97.5 F (  36.4 C)   TempSrc:      SpO2: 100% 100% 100% 94%  Weight:      Height:       No intake or output data in the 24 hours ending 08/23/20 1711 Filed Weights   08/15/20 1325 08/15/20 1413 08/15/20 2104  Weight: 59.9 kg 59.9 kg 58.4 kg    Examination:  General exam: Alert, awake, oriented x 3 Respiratory system: Clear to  auscultation. Respiratory effort normal. Cardiovascular system:RRR. No murmurs, rubs, gallops. Gastrointestinal system: Abdomen is nondistended, tender in left abdomen. No organomegaly or masses felt. Normal bowel sounds heard. Central nervous system: Alert and oriented. No focal neurological deficits. Extremities: No C/C/E, +pedal pulses Skin: No rashes, lesions or ulcers Psychiatry: Judgement and insight appear normal. Mood & affect appropriate.     Data Reviewed: I have personally reviewed following labs and imaging studies  CBC: Recent Labs  Lab 08/17/20 0946 08/18/20 0022 08/20/20 0413 08/22/20 0339  WBC 8.9 10.2 5.3 8.4  NEUTROABS  --  7.6 2.3  --   HGB 10.8* 10.2* 9.6* 9.4*  HCT 33.7* 32.3* 30.5* 29.5*  MCV 94.1 93.9 93.6 93.4  PLT 389 394 411* 464*   Basic Metabolic Panel: Recent Labs  Lab 08/20/20 0413 08/20/20 1556 08/21/20 0416 08/22/20 0339 08/23/20 0507  NA 141 138 141 140 140  K 2.8* 3.6 3.2* 5.0 4.3  CL 105 105 109 109 102  CO2 23 24 23 23 28   GLUCOSE 86 84 86 91 87  BUN <5* <5* <5* <5* <5*  CREATININE 0.69 0.59 0.60 0.57 0.64  CALCIUM 8.8* 8.8* 8.5* 9.1 9.4  MG 1.3* 1.5* 2.0 1.8 1.8  PHOS 3.4  --   --   --  2.9   GFR: Estimated Creatinine Clearance: 82.7 mL/min (by C-G formula based on SCr of 0.64 mg/dL). Liver Function Tests: Recent Labs  Lab 08/17/20 0946 08/18/20 0022 08/20/20 0413 08/23/20 0507  AST 13* 10* 16 20  ALT 32 27 18 21   ALKPHOS 164* 148* 139* 174*  BILITOT 0.3 0.3 0.3 0.4  PROT 5.9* 5.9* 5.3* 5.5*  ALBUMIN 2.7* 2.8* 2.5* 2.7*   Recent Labs  Lab 08/18/20 0022  LIPASE 95*   No results for input(s): AMMONIA in the last 168 hours. Coagulation Profile: No results for input(s): INR, PROTIME in the last 168 hours. Cardiac Enzymes: No results for input(s): CKTOTAL, CKMB, CKMBINDEX, TROPONINI in the last 168 hours. BNP (last 3 results) No results for input(s): PROBNP in the last 8760 hours. HbA1C: No results for input(s):  HGBA1C in the last 72 hours. CBG: No results for input(s): GLUCAP in the last 168 hours. Lipid Profile: Recent Labs    08/21/20 0416  TRIG 171*   Thyroid Function Tests: No results for input(s): TSH, T4TOTAL, FREET4, T3FREE, THYROIDAB in the last 72 hours. Anemia Panel: No results for input(s): VITAMINB12, FOLATE, FERRITIN, TIBC, IRON, RETICCTPCT in the last 72 hours. Sepsis Labs: No results for input(s): PROCALCITON, LATICACIDVEN in the last 168 hours.  Recent Results (from the past 240 hour(s))  Resp Panel by RT-PCR (Flu A&B, Covid) Nasopharyngeal Swab     Status: None   Collection Time: 08/15/20  6:05 PM   Specimen: Nasopharyngeal Swab; Nasopharyngeal(NP) swabs in vial transport medium  Result Value Ref Range Status   SARS Coronavirus 2 by RT PCR NEGATIVE NEGATIVE Final    Comment: (NOTE) SARS-CoV-2 target nucleic acids are NOT DETECTED.  The SARS-CoV-2 RNA is generally detectable in upper respiratory specimens during  the acute phase of infection. The lowest concentration of SARS-CoV-2 viral copies this assay can detect is 138 copies/mL. A negative result does not preclude SARS-Cov-2 infection and should not be used as the sole basis for treatment or other patient management decisions. A negative result may occur with  improper specimen collection/handling, submission of specimen other than nasopharyngeal swab, presence of viral mutation(s) within the areas targeted by this assay, and inadequate number of viral copies(<138 copies/mL). A negative result must be combined with clinical observations, patient history, and epidemiological information. The expected result is Negative.  Fact Sheet for Patients:  BloggerCourse.com  Fact Sheet for Healthcare Providers:  SeriousBroker.it  This test is no t yet approved or cleared by the [place] FDA and  has been authorized for detection and/or diagnosis of SARS-CoV-2 by FDA  under an Emergency Use Authorization (EUA). This EUA will remain  in effect (meaning this test can be used) for the duration of the COVID-19 declaration under Section 564(b)(1) of the Act, 21 U.S.C.section 360bbb-3(b)(1), unless the authorization is terminated  or revoked sooner.       Influenza A by PCR NEGATIVE NEGATIVE Final   Influenza B by PCR NEGATIVE NEGATIVE Final    Comment: (NOTE) The Xpert Xpress SARS-CoV-2/FLU/RSV plus assay is intended as an aid in the diagnosis of influenza from Nasopharyngeal swab specimens and should not be used as a sole basis for treatment. Nasal washings and aspirates are unacceptable for Xpert Xpress SARS-CoV-2/FLU/RSV testing.  Fact Sheet for Patients: BloggerCourse.com  Fact Sheet for Healthcare Providers: SeriousBroker.it  This test is not yet approved or cleared by the [place] FDA and has been authorized for detection and/or diagnosis of SARS-CoV-2 by FDA under an Emergency Use Authorization (EUA). This EUA will remain in effect (meaning this test can be used) for the duration of the COVID-19 declaration under Section 564(b)(1) of the Act, 21 U.S.C. section 360bbb-3(b)(1), unless the authorization is terminated or revoked.  Performed at [facility], [address], [place], [place] [address]          Radiology Studies: [place] EKG SITE RITE  Result Date: 08/22/2020 If Site Rite image not attached, placement could not be confirmed due to current cardiac rhythm.       Scheduled Meds: . chlorhexidine  15 mL Mouth Rinse BID  . Chlorhexidine Gluconate Cloth  6 each Topical Daily  . colestipol  2 g Oral BID  . dicyclomine  10 mg Oral TID AC & HS  . feeding supplement  1 Container Oral TID BM  . fluticasone  2 spray Each Nare Daily  . heparin  5,000 Units Subcutaneous Q8H  . insulin aspart  0-9 Units Subcutaneous Q4H  . lipase/protease/amylase  36,000 Units Oral TID WC   . mouth rinse  15 mL Mouth Rinse q12n4p  . pantoprazole  40 mg Oral Q12H  . sodium chloride flush  10-40 mL Intracatheter Q12H   Continuous Infusions: . sodium chloride 100 mL/hr at 08/23/20 1408  . meropenem (MERREM) IV 1 g (08/23/20 1621)  . TPN ADULT (ION)       LOS: 8 days    Time spent:    Erick Blinks, MD [facility]   If 7PM-7AM, please contact night-coverage [email or web address]  08/23/2020, 5:11 PM

## 2020-08-23 NOTE — Progress Notes (Signed)
Peripherally Inserted Central Catheter Placement  The IV Nurse has discussed with the patient and/or persons authorized to consent for the patient, the purpose of this procedure and the potential benefits and risks involved with this procedure.  The benefits include less needle sticks, lab draws from the catheter, and the patient may be discharged home with the catheter. Risks include, but not limited to, infection, bleeding, blood clot (thrombus formation), and puncture of an artery; nerve damage and irregular heartbeat and possibility to perform a PICC exchange if needed/ordered by physician.  Alternatives to this procedure were also discussed.  Bard Power PICC patient education guide, fact sheet on infection prevention and patient information card has been provided to patient /or left at bedside.    PICC Placement Documentation  PICC Double Lumen 08/23/20 PICC Right Brachial 37 cm 1 cm (Active)  Indication for Insertion or Continuance of Line Administration of hyperosmolar/irritating solutions (i.e. TPN, Vancomycin, etc.) 08/23/20 0015  Exposed Catheter (cm) 1 cm 08/23/20 0015  Site Assessment Clean;Dry;Intact 08/23/20 0015  Lumen #1 Status Capped (Central line);Flushed;Blood return noted 08/23/20 0015  Lumen #2 Status Capped (Central line);Flushed;Blood return noted 08/23/20 0015  Dressing Type Transparent;Occlusive 08/23/20 0015  Dressing Status Clean;Dry;Intact 08/23/20 0015  Antimicrobial disc in place? Yes 08/23/20 0015  Safety Lock Not Applicable 08/23/20 0015  Line Care Connections checked and tightened 08/23/20 0015  Line Adjustment (NICU/IV Team Only) No 08/23/20 0015  Dressing Intervention New dressing 08/23/20 0015  Dressing Change Due 08/30/20 08/23/20 0015       Tricia Brown 08/23/2020, 12:29 AM

## 2020-08-23 NOTE — Progress Notes (Signed)
PHARMACY - TOTAL PARENTERAL NUTRITION CONSULT NOTE  Indication:  Severe pancreatitis  Patient Measurements: Height: 5\' 7"  (170.2 cm) Weight: 58.4 kg (128 lb 12 oz) IBW/kg (Calculated) : 61.6 TPN AdjBW (KG): 58.4 Body mass index is 20.16 kg/m. Usual Weight: 61-64 kg last admit, now 58.4 kg  Assessment:  89 YOF recently admitted 10/20-11/17 with necrotizing pancreatitis with infected pseudocyst s/p cystgastrostomy and then stent removal.  Patient returned on 08/15/20 with abdominal pain and progressive phlegmon in the LUQ.  Pharmacy consulted for TPN management as patient is refusing nasal jejunal feeds and failed full liquid diet per Dr. 08/17/20.  Patient endorses a 20-lb weight loss over the past 6 months.  Prior to this admission, she was eating at 25% capacity compared to before the initial onset of pancreatitis.  Glucose / Insulin: no hx DM, A1c 4.6% - AM glucose < 120 Electrolytes: CoCa 10.44. All electrolytes wnl. Received 40-120 mEq of KCl daily in the last 3 days and Mg IV 4g on 11/30   Renal: SCr < 1, BUN < 5 LFTs / TGs: LFT wnl, Tbili 0.4; TG 171 at baseline  Prealbumin / albumin: albumin 2.7, lipase 95 Intake / Output; MIVF: BM x2 on 12/1.  GI Imaging: N/A Surgeries / Procedures: N/A  Central access: 12/3 TPN start date: 12/3  Nutritional Goals (per RD rec on 08/22/20): 2000-2200 kCal, 105-120gm protein per day, fluid > 2L Goal TPN 4ml/hr (50 g/L AA, 30 g/L ILE and 15% CHO)  Current Nutrition:  Boost TID -  Patient reports consuming 100% Full liquid diet - patient reported eating 50% on 12/1 and 75% on 12/2 Discussed with Dr. 14/2 who states patient failed trial of FLD given continued severe abdominal pain; 12/2 NPO for PICC line > 12/3 resume FLD per Dr. 14/3   Plan:  Start TPN at 40 ml/hr (Provides 48g AA, 144g dextrose, 29g lipids, 969 kcal, about 50% estimated patient needs) - Using Clinisol 15% due to Travasol unavailable  Electrolytes: 75 mEq/L Na, 45  mEq/L K, 0 Ca, 12 mEq/L Mg, 15 mmol/L Phos, Bosie Clos 1:1  Continue PO multivitamin and Creon given on FLD, no MVI or trace elements in TPN Start sSSI Q6 hr at 2000  Monitor PO intake, advance TPN and decrease SSI as needed Monitor labs daily until TPN at goal then every Mon/Thur  AS:TMHD, PharmD, BCPS, BCCP Clinical Pharmacist  Please check AMION for all [facility] phone numbers After 10:00 PM, call Main Pharmacy [phone]

## 2020-08-24 DIAGNOSIS — R1084 Generalized abdominal pain: Secondary | ICD-10-CM | POA: Diagnosis not present

## 2020-08-24 DIAGNOSIS — E876 Hypokalemia: Secondary | ICD-10-CM | POA: Diagnosis not present

## 2020-08-24 DIAGNOSIS — K859 Acute pancreatitis without necrosis or infection, unspecified: Secondary | ICD-10-CM | POA: Diagnosis not present

## 2020-08-24 DIAGNOSIS — N179 Acute kidney failure, unspecified: Secondary | ICD-10-CM | POA: Diagnosis not present

## 2020-08-24 LAB — COMPREHENSIVE METABOLIC PANEL
ALT: 30 U/L (ref 0–44)
AST: 35 U/L (ref 15–41)
Albumin: 2.8 g/dL — ABNORMAL LOW (ref 3.5–5.0)
Alkaline Phosphatase: 172 U/L — ABNORMAL HIGH (ref 38–126)
Anion gap: 9 (ref 5–15)
BUN: <5 mg/dL — ABNORMAL LOW (ref 6–20)
CO2: 30 mmol/L (ref 22–32)
Calcium: 8.9 mg/dL (ref 8.9–10.3)
Chloride: 101 mmol/L (ref 98–111)
Creatinine, Ser: 0.66 mg/dL (ref 0.44–1.00)
GFR, Estimated: >60 mL/min (ref >60)
Glucose, Bld: 100 mg/dL — ABNORMAL HIGH (ref 70–99)
Potassium: 4.4 mmol/L (ref 3.5–5.1)
Sodium: 140 mmol/L (ref 135–145)
Total Bilirubin: 0.3 mg/dL (ref 0.3–1.2)
Total Protein: 6 g/dL — ABNORMAL LOW (ref 6.5–8.1)

## 2020-08-24 LAB — PREALBUMIN: Prealbumin: 19.4 mg/dL (ref 18–38)

## 2020-08-24 LAB — CBC
HCT: 33.4 % — ABNORMAL LOW (ref 36.0–46.0)
Hemoglobin: 10.4 g/dL — ABNORMAL LOW (ref 12.0–15.0)
MCH: 29.5 pg (ref 26.0–34.0)
MCHC: 31.1 g/dL (ref 30.0–36.0)
MCV: 94.9 fL (ref 80.0–100.0)
Platelets: 512 10*3/uL — ABNORMAL HIGH (ref 150–400)
RBC: 3.52 MIL/uL — ABNORMAL LOW (ref 3.87–5.11)
RDW: 14.2 % (ref 11.5–15.5)
WBC: 8.5 10*3/uL (ref 4.0–10.5)
nRBC: 0 % (ref 0.0–0.2)

## 2020-08-24 LAB — MAGNESIUM: Magnesium: 1.8 mg/dL (ref 1.7–2.4)

## 2020-08-24 LAB — TRIGLYCERIDES: Triglycerides: 140 mg/dL (ref <150)

## 2020-08-24 LAB — GLUCOSE, CAPILLARY
Glucose-Capillary: 108 mg/dL — ABNORMAL HIGH (ref 70–99)
Glucose-Capillary: 87 mg/dL (ref 70–99)
Glucose-Capillary: 98 mg/dL (ref 70–99)
Glucose-Capillary: 96 mg/dL (ref 70–99)
Glucose-Capillary: 97 mg/dL (ref 70–99)
Glucose-Capillary: 106 mg/dL — ABNORMAL HIGH (ref 70–99)

## 2020-08-24 LAB — PHOSPHORUS: Phosphorus: 3.3 mg/dL (ref 2.5–4.6)

## 2020-08-24 MED ORDER — TRAVASOL 10 % IV SOLN
INTRAVENOUS | Status: AC
  Filled 2020-08-24: qty 780

## 2020-08-24 NOTE — Progress Notes (Signed)
PROGRESS NOTE    Vennela Jutte  OEV:[id number] DOB: [birth year] DOA: 08/15/2020 PCP: [name], MD    Brief Narrative:  Imo Cumbie a 44 y.o.femalewith medical history significant ofGerd, Necrotizing Pancreatitis. Pt was just discharged on 11/17/21for her pancreatitis. [facility] was consulted patient had an endoscopic ultrasound that showed extrinsic compression in the posterior wall of the stomach with multiple cysts identified in the pancreatic body and tail, patient had AXIOS cystogastrostomyperformed, plastic pigtail stents were placed. Repeat imaging did show improvement of the pseudocyst, and patient had removal of the stents on 08/02/2020. Patient was also treated with antibiotics and patient was discharged on pancreatic enzymes, colestipol, PPI therapy with as needed nausea therapy with Zofran. She was tolerating a diet when she was discharged while this is a heavy ever heard of axials stent.Patient was to follow-up with GI on outpatient basis.  [facility] were consulted to admit the patient for further evaluation and treatment. GI was consulted on admission. She was admitted to a medical bed. She is NPO with IV fluids, IV antiemetics, and pain control. Abdominal CT has been reviewed by Dr. Meridee Score. The new formation is multilocular and not amenable to cyst gastrostomy at this time.  Patient wishes to avoid supplementing her nutrition with TPN or jejeunal feedings at this time. GI hs started her on pancreatinc enzymes and has adjusted her pain regimen.    Assessment & Plan:   Principal Problem:   Abdominal pain Active Problems:   Pancreatitis   Hypokalemia   AKI (acute kidney injury) (HCC)   Transaminitis   Anemia   Severe pancreatitis with loculated pseudocyst formation -Not amenable to cyst gastrostomy at this time -Appreciate GI input -Started on pancreatic enzymes -Did not tolerate full liquids, so started on clears and TPN -Pain  remains uncontrolled and she is requiring IV pain medications. -Plans for repeat CT abdomen on 12/5  Acute kidney injury.  Patient presented with a creatinine of 1.1 on admission.  -Related to dehydration and volume loss -This is improved down to 0.6 with IV fluids.  Hypokalemia -Resolved  Hypoalbuminemia -Related to decreased p.o. intake -Nutrition following  Anemia -B12 and folate are not low -Iron studies indicate mild iron deficiency -Likely element of chronic disease from pancreatitis as well -Hemoglobin is currently stable, monitor   DVT prophylaxis: heparin injection 5,000 Units Start: 08/15/20 2200 SCDs Start: 08/15/20 1854  Code Status: Full code Family Communication: Discussed with patient Disposition Plan: Status is: Inpatient  Remains inpatient appropriate because:Ongoing active pain requiring inpatient pain management and IV treatments appropriate due to intensity of illness or inability to take PO   Dispo: The patient is from: Home              Anticipated d/c is to: Home              Anticipated d/c date is: 2 days              Patient currently is not medically stable to d/c.    Consultants:   [facility]  Procedures:   PICC placement 08/23/20  Antimicrobials:   Meropenem 11/26 >12/3   Subjective: Feels abdominal pain is somewhat better today, although still present.  She still requiring IV pain medicine.  Objective: Vitals:   08/23/20 0451 08/23/20 1507 08/23/20 2019 08/24/20 0431  BP: (!) 144/89 126/68 137/82 133/80  Pulse: 91 98 94 95  Resp: 17 18 17 18   Temp: (!) 97.5 F (36.4 C)  98 F (36.7 C)  98.1 F (36.7 C)  TempSrc:   Oral Oral  SpO2: 100% 94% 100% 100%  Weight:      Height:        Intake/Output Summary (Last 24 hours) at 08/24/2020 1936 Last data filed at 08/24/2020 1717 Gross per 24 hour  Intake 1800 ml  Output 100 ml  Net 1700 ml   Filed Weights   08/15/20 1325 08/15/20 1413 08/15/20 2104  Weight: 59.9 kg 59.9 kg  58.4 kg    Examination:  General exam: Alert, awake, oriented x 3 Respiratory system: Clear to auscultation. Respiratory effort normal. Cardiovascular system:RRR. No murmurs, rubs, gallops. Gastrointestinal system: Abdomen is nondistended, diffusely tender. No organomegaly or masses felt. Normal bowel sounds heard. Central nervous system: Alert and oriented. No focal neurological deficits. Extremities: No C/C/E, +pedal pulses Skin: No rashes, lesions or ulcers Psychiatry: Judgement and insight appear normal. Mood & affect appropriate.      Data Reviewed: I have personally reviewed following labs and imaging studies  CBC: Recent Labs  Lab 08/18/20 0022 08/20/20 0413 08/22/20 0339 08/24/20 0321  WBC 10.2 5.3 8.4 8.5  NEUTROABS 7.6 2.3  --   --   HGB 10.2* 9.6* 9.4* 10.4*  HCT 32.3* 30.5* 29.5* 33.4*  MCV 93.9 93.6 93.4 94.9  PLT 394 411* 464* 512*   Basic Metabolic Panel: Recent Labs  Lab 08/20/20 0413 08/20/20 0413 08/20/20 1556 08/21/20 0416 08/22/20 0339 08/23/20 0507 08/24/20 0321  NA 141   < > 138 141 140 140 140  K 2.8*   < > 3.6 3.2* 5.0 4.3 4.4  CL 105   < > 105 109 109 102 101  CO2 23   < > 24 23 23 28 30   GLUCOSE 86   < > 84 86 91 87 100*  BUN <5*   < > <5* <5* <5* <5* <5*  CREATININE 0.69   < > 0.59 0.60 0.57 0.64 0.66  CALCIUM 8.8*   < > 8.8* 8.5* 9.1 9.4 8.9  MG 1.3*   < > 1.5* 2.0 1.8 1.8 1.8  PHOS 3.4  --   --   --   --  2.9 3.3   < > = values in this interval not displayed.   GFR: Estimated Creatinine Clearance: 82.7 mL/min (by C-G formula based on SCr of 0.66 mg/dL). Liver Function Tests: Recent Labs  Lab 08/18/20 0022 08/20/20 0413 08/23/20 0507 08/24/20 0321  AST 10* 16 20 35  ALT 27 18 21 30   ALKPHOS 148* 139* 174* 172*  BILITOT 0.3 0.3 0.4 0.3  PROT 5.9* 5.3* 5.5* 6.0*  ALBUMIN 2.8* 2.5* 2.7* 2.8*   Recent Labs  Lab 08/18/20 0022  LIPASE 95*   No results for input(s): AMMONIA in the last 168 hours. Coagulation  Profile: No results for input(s): INR, PROTIME in the last 168 hours. Cardiac Enzymes: No results for input(s): CKTOTAL, CKMB, CKMBINDEX, TROPONINI in the last 168 hours. BNP (last 3 results) No results for input(s): PROBNP in the last 8760 hours. HbA1C: No results for input(s): HGBA1C in the last 72 hours. CBG: Recent Labs  Lab 08/23/20 2323 08/24/20 0309 08/24/20 0759 08/24/20 1244 08/24/20 1714  GLUCAP 96 108* 87 98 96   Lipid Profile: Recent Labs    08/24/20 0321  TRIG 140   Thyroid Function Tests: No results for input(s): TSH, T4TOTAL, FREET4, T3FREE, THYROIDAB in the last 72 hours. Anemia Panel: No results for input(s): VITAMINB12, FOLATE, FERRITIN, TIBC, IRON, RETICCTPCT in the last 72  hours. Sepsis Labs: No results for input(s): PROCALCITON, LATICACIDVEN in the last 168 hours.  Recent Results (from the past 240 hour(s))  Resp Panel by RT-PCR (Flu A&B, Covid) Nasopharyngeal Swab     Status: None   Collection Time: 08/15/20  6:05 PM   Specimen: Nasopharyngeal Swab; Nasopharyngeal(NP) swabs in vial transport medium  Result Value Ref Range Status   SARS Coronavirus 2 by RT PCR NEGATIVE NEGATIVE Final    Comment: (NOTE) SARS-CoV-2 target nucleic acids are NOT DETECTED.  The SARS-CoV-2 RNA is generally detectable in upper respiratory specimens during the acute phase of infection. The lowest concentration of SARS-CoV-2 viral copies this assay can detect is 138 copies/mL. A negative result does not preclude SARS-Cov-2 infection and should not be used as the sole basis for treatment or other patient management decisions. A negative result may occur with  improper specimen collection/handling, submission of specimen other than nasopharyngeal swab, presence of viral mutation(s) within the areas targeted by this assay, and inadequate number of viral copies(<138 copies/mL). A negative result must be combined with clinical observations, patient history, and  epidemiological information. The expected result is Negative.  Fact Sheet for Patients:  BloggerCourse.com  Fact Sheet for Healthcare Providers:  SeriousBroker.it  This test is no t yet approved or cleared by the [place] FDA and  has been authorized for detection and/or diagnosis of SARS-CoV-2 by FDA under an Emergency Use Authorization (EUA). This EUA will remain  in effect (meaning this test can be used) for the duration of the COVID-19 declaration under Section 564(b)(1) of the Act, 21 U.S.C.section 360bbb-3(b)(1), unless the authorization is terminated  or revoked sooner.       Influenza A by PCR NEGATIVE NEGATIVE Final   Influenza B by PCR NEGATIVE NEGATIVE Final    Comment: (NOTE) The Xpert Xpress SARS-CoV-2/FLU/RSV plus assay is intended as an aid in the diagnosis of influenza from Nasopharyngeal swab specimens and should not be used as a sole basis for treatment. Nasal washings and aspirates are unacceptable for Xpert Xpress SARS-CoV-2/FLU/RSV testing.  Fact Sheet for Patients: BloggerCourse.com  Fact Sheet for Healthcare Providers: SeriousBroker.it  This test is not yet approved or cleared by the [place] FDA and has been authorized for detection and/or diagnosis of SARS-CoV-2 by FDA under an Emergency Use Authorization (EUA). This EUA will remain in effect (meaning this test can be used) for the duration of the COVID-19 declaration under Section 564(b)(1) of the Act, 21 U.S.C. section 360bbb-3(b)(1), unless the authorization is terminated or revoked.  Performed at [facility], [address], [place], [place] [address]          Radiology Studies: No results found.      Scheduled Meds: . chlorhexidine  15 mL Mouth Rinse BID  . Chlorhexidine Gluconate Cloth  6 each Topical Daily  . colestipol  2 g Oral BID  . dicyclomine  10 mg Oral  TID AC & HS  . feeding supplement  1 Container Oral TID BM  . fluticasone  2 spray Each Nare Daily  . heparin  5,000 Units Subcutaneous Q8H  . insulin aspart  0-9 Units Subcutaneous Q4H  . lipase/protease/amylase  36,000 Units Oral TID WC  . mouth rinse  15 mL Mouth Rinse q12n4p  . pantoprazole  40 mg Oral Q12H  . sodium chloride flush  10-40 mL Intracatheter Q12H   Continuous Infusions: . TPN ADULT (ION) 65 mL/hr at 08/24/20 1712     LOS: 9 days  Time spent:    Erick Blinks, MD [facility]   If 7PM-7AM, please contact night-coverage [email or web address]  08/24/2020, 7:36 PM .

## 2020-08-24 NOTE — Progress Notes (Signed)
PHARMACY - TOTAL PARENTERAL NUTRITION CONSULT NOTE  Indication: Complicated pancreatitis with loculated pseudocysts  Patient Measurements: Height: 5\' 7"  (170.2 cm) Weight: 58.4 kg (128 lb 12 oz) IBW/kg (Calculated) : 61.6 TPN AdjBW (KG): 58.4 Body mass index is 20.16 kg/m. 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  Patient returned on 08/15/20 with abdominal pain and progressive phlegmon in the LUQ.  Pharmacy consulted for TPN management as patient is refusing nasal jejunal feeds and failed full liquid diet per Dr. 08/17/20.  Patient endorses a 20-lb weight loss over the past 6 months.  Prior to this admission, she was eating at 25% capacity compared to before the initial onset of pancreatitis.  Glucose / Insulin: no hx DM, A1c 4.6% - BG < 120, 0 units insulin used Electrolytes: CoCa 9.8. All electrolytes wnl. CO2 uptrending. Received 40-120 mEq of KCl/d 12/1-12/3 and Mg IV 4g on 11/30   Renal: SCr 0.6, BUN < 5 LFTs / TGs: LFT/Tbili wnl; TG 140  Prealbumin / albumin: albumin 2.8, prealbumin 19.4, lipase 95 Intake / Output; MIVF: BM x2 on 12/1.  GI Imaging: N/A Surgeries / Procedures: N/A  Central access: 12/3 TPN start date: 12/3  Nutritional Goals (per RD rec on 08/22/20): 2000-2200 kCal, 105-120gm protein per day, fluid > 2L Goal TPN 28ml/hr (50 g/L AA, 30 g/L ILE and 15% CHO)  Current Nutrition:  Boost TID discontinued (Patient was consuming 100%)  12/1- 12/2 Full liquid diet - patient reported eating 50% on 12/1 and 75% on 12/2 Discussed with Dr. 14/2 who states patient failed trial of FLD given continued severe abdominal pain;  12/2 NPO for PICC line > 12/3 resumed CLD per Dr. 14/3   Plan:  Change to Travasol and Increase TPN to 65 ml/hr (Provides 78g AA, 234g dextrose, 47g lipids, 1575 kcal, about 75% estimated  patient needs)  Electrolytes: Continue 75 mEq/L Na, 12 mEq/L Mg; Decrease to 25 mEq/L K, 9 mmol/L Phos; Add 3 mEq/L Ca; change Cl:Acet 2:1  Add MVI and trace elements in TPN Continue sSSI Q6 hr, discontinue if not needing at goal TPN Stop NS at 1800 tonight per discussion with MD Monitor PO intake, adjust TPN as needed Monitor labs daily until TPN at goal then every Mon/Thur  Bosie Clos, PharmD, BCPS, BCCP Clinical Pharmacist  Please check AMION for all [facility] Pharmacy phone numbers After 10:00 PM, call Main Pharmacy [phone]

## 2020-08-24 NOTE — Progress Notes (Signed)
[facility] Gastroenterology Progress Note  Britzy Graul 44 y.o. [birth year]   Subjective: Abdominal pain present but not quite as intense. Less nausea. Reports ongoing diarrhea. Tolerating clear liquid diet.   Objective: Vital signs: Vitals:   08/23/20 2019 08/24/20 0431  BP: 137/82 133/80  Pulse: 94 95  Resp: 17 18  Temp: 98 F (36.7 C) 98.1 F (36.7 C)  SpO2: 100% 100%    Physical Exam: Gen: alert, no acute distress, thin  HEENT: anicteric sclera CV: RRR Chest: CTA B Abd: diffuse tenderness with guarding, soft, nondistended, +BS Ext: no edema  Lab Results: Recent Labs    08/23/20 0507 08/24/20 0321  NA 140 140  K 4.3 4.4  CL 102 101  CO2 28 30  GLUCOSE 87 100*  BUN <5* <5*  CREATININE 0.64 0.66  CALCIUM 9.4 8.9  MG 1.8 1.8  PHOS 2.9 3.3   Recent Labs    08/23/20 0507 08/24/20 0321  AST 20 35  ALT 21 30  ALKPHOS 174* 172*  BILITOT 0.4 0.3  PROT 5.5* 6.0*  ALBUMIN 2.7* 2.8*   Recent Labs    08/22/20 0339 08/24/20 0321  WBC 8.4 8.5  HGB 9.4* 10.4*  HCT 29.5* 33.4*  MCV 93.4 94.9  PLT 464* 512*      Assessment/Plan: Complicated pancreatitis with pseudocysts. Will do an updated CT tomorrow and if pseudocysts are not loculated will re-address with Dr. Meridee Score for repeat cyst gastrostomy drainage. Continue TPN and clear liquid diet. Continue Creon and Colestipol. Need accurate documentation of stools. Dr. Ewing Schlein to f/u tomorrow.   [name] 08/24/2020, 2:17 PM  Questions please call [phone] ID: [name], female   DOB: [birth year], 44 y.o.   MRN: [id number]

## 2020-08-25 ENCOUNTER — Inpatient Hospital Stay (HOSPITAL_COMMUNITY): Payer: Medicaid Other

## 2020-08-25 DIAGNOSIS — R1084 Generalized abdominal pain: Secondary | ICD-10-CM | POA: Diagnosis not present

## 2020-08-25 DIAGNOSIS — D509 Iron deficiency anemia, unspecified: Secondary | ICD-10-CM

## 2020-08-25 DIAGNOSIS — K859 Acute pancreatitis without necrosis or infection, unspecified: Secondary | ICD-10-CM | POA: Diagnosis not present

## 2020-08-25 LAB — BASIC METABOLIC PANEL
Anion gap: 8 (ref 5–15)
BUN: 7 mg/dL (ref 6–20)
CO2: 28 mmol/L (ref 22–32)
Calcium: 9 mg/dL (ref 8.9–10.3)
Chloride: 103 mmol/L (ref 98–111)
Creatinine, Ser: 0.56 mg/dL (ref 0.44–1.00)
GFR, Estimated: >60 mL/min (ref >60)
Glucose, Bld: 105 mg/dL — ABNORMAL HIGH (ref 70–99)
Potassium: 4.5 mmol/L (ref 3.5–5.1)
Sodium: 139 mmol/L (ref 135–145)

## 2020-08-25 LAB — PHOSPHORUS: Phosphorus: 2.9 mg/dL (ref 2.5–4.6)

## 2020-08-25 LAB — GLUCOSE, CAPILLARY
Glucose-Capillary: 104 mg/dL — ABNORMAL HIGH (ref 70–99)
Glucose-Capillary: 115 mg/dL — ABNORMAL HIGH (ref 70–99)
Glucose-Capillary: 124 mg/dL — ABNORMAL HIGH (ref 70–99)
Glucose-Capillary: 128 mg/dL — ABNORMAL HIGH (ref 70–99)
Glucose-Capillary: 85 mg/dL (ref 70–99)
Glucose-Capillary: 98 mg/dL (ref 70–99)

## 2020-08-25 LAB — MAGNESIUM: Magnesium: 1.9 mg/dL (ref 1.7–2.4)

## 2020-08-25 MED ORDER — INSULIN ASPART 100 UNIT/ML ~~LOC~~ SOLN
0.0000 [IU] | Freq: Three times a day (TID) | SUBCUTANEOUS | Status: DC
  Administered 2020-08-25 – 2020-08-27 (×5): 1 [IU] via SUBCUTANEOUS

## 2020-08-25 MED ORDER — IOHEXOL 300 MG/ML  SOLN
100.0000 mL | Freq: Once | INTRAMUSCULAR | Status: AC | PRN
  Administered 2020-08-25: 100 mL via INTRAVENOUS

## 2020-08-25 MED ORDER — TRAVASOL 10 % IV SOLN
INTRAVENOUS | Status: AC
  Filled 2020-08-25: qty 1020

## 2020-08-25 NOTE — Progress Notes (Signed)
PROGRESS NOTE  Tricia Brown PYK:[id number] DOB: 11-29-1975   PCP: [name], MD  Patient is from: Home  DOA: 08/15/2020 LOS: 10  Chief complaints: Abdominal pain  Brief Narrative / Interim history: 44 year old female with history of MDD, GERD and recent hospitalization from 10/20-11/17 for necrotizing pancreatitis with pseudocyst requiring AXIOS cystogastrostomy and stent placement and removals, and IV antibiotics returning with abdominal pain.  CT abdomen and pelvis showed new formation of multilocular cyst not amenable to cystogastrostomy at this time per GI.  She is currently on TPN supplementation, IV pain regimen and pancreatic enzymes.  GI following.  Repeat CT abdomen and pelvis pending.  Subjective: Seen and examined earlier this morning.  No major events overnight or this morning.  Continues to endorse significant LUQ pain radiating to her left chest and left back.  She rates her pain "severe".  Improved with IV Dilaudid.  Objective: Vitals:   08/23/20 2019 08/24/20 0431 08/24/20 1945 08/25/20 0402  BP: 137/82 133/80 128/85 121/81  Pulse: 94 95 86 84  Resp: 17 18 17 17   Temp: 98 F (36.7 C) 98.1 F (36.7 C)  (!) 97.5 F (36.4 C)  TempSrc: Oral Oral    SpO2: 100% 100% (!) 73% 98%  Weight:      Height:        Intake/Output Summary (Last 24 hours) at 08/25/2020 1222 Last data filed at 08/25/2020 0659 Gross per 24 hour  Intake 1750 ml  Output 100 ml  Net 1650 ml   Filed Weights   08/15/20 1325 08/15/20 1413 08/15/20 2104  Weight: 59.9 kg 59.9 kg 58.4 kg    Examination:  GENERAL: No apparent distress.  Nontoxic. HEENT: MMM.  Vision and hearing grossly intact.  NECK: Supple.  No apparent JVD.  RESP:  No IWOB.  Fair aeration bilaterally. CVS:  RRR. Heart sounds normal.  ABD/GI/GU: BS+. Abd soft.  Tenderness over LUQ. MSK/EXT:  Moves extremities. No apparent deformity. No edema.  SKIN: no apparent skin lesion or wound NEURO: Awake, alert and oriented  appropriately.  No apparent focal neuro deficit. PSYCH: Calm. Normal affect.  Procedures:  None  Microbiology summarized: COVID-19 and influenza PCR nonreactive.  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 -Continue pain control with IV Dilaudid -On TPN, clear liquid diet, creatinine, supplements and PPI -Follow repeat CT a/p -GI following.  Acute kidney injury: Resolved. -Monitor intermittently  Hypokalemia: Resolved.  Hypoalbuminemia: due to poor p.o. intake in the setting of #1. -Nutrition following  Iron deficiency anemia: Iron sat 6%.  TIBC, ferritin, folate and B12 within normal.  H&H stable. -Continue monitoring  Nutrition: Poor p.o. intake in the setting of pancreatitis. Body mass index is 20.16 kg/m. Nutrition Problem: Increased nutrient needs Etiology: acute illness (pancreatitis) Signs/Symptoms: estimated needs Interventions: TPN   DVT prophylaxis:  heparin injection 5,000 Units Start: 08/15/20 2200 SCDs Start: 08/15/20 1854  Code Status: Full code Family Communication: Patient and/or RN. Available if any question.  Status is: Inpatient  Remains inpatient appropriate because:Ongoing active pain requiring inpatient pain management, Ongoing diagnostic testing needed not appropriate for outpatient work up, IV treatments appropriate due to intensity of illness or inability to take PO and Inpatient level of care appropriate due to severity of illness   Dispo: The patient is from: Home  Anticipated d/c is to: Home              Anticipated d/c date is: > 3 days              Patient currently is not medically stable to d/c.       Consultants:  Gastroenterology   Sch Meds:  Scheduled Meds: .  chlorhexidine  15 mL Mouth Rinse BID  . Chlorhexidine Gluconate Cloth  6 each Topical Daily  . colestipol  2 g Oral BID  . dicyclomine  10 mg Oral TID AC & HS  . feeding supplement  1 Container Oral TID BM  . fluticasone  2 spray Each Nare Daily  . heparin  5,000 Units Subcutaneous Q8H  . insulin aspart  0-9 Units Subcutaneous Q8H  . lipase/protease/amylase  36,000 Units Oral TID WC  . mouth rinse  15 mL Mouth Rinse q12n4p  . pantoprazole  40 mg Oral Q12H  . sodium chloride flush  10-40 mL Intracatheter Q12H   Continuous Infusions: . TPN ADULT (ION) 65 mL/hr at 08/25/20 0659  . TPN ADULT (ION)     PRN Meds:.HYDROmorphone (DILAUDID) injection, ondansetron **OR** ondansetron (ZOFRAN) IV, oxyCODONE, sodium chloride flush  Antimicrobials: Anti-infectives (From admission, onward)   Start     Dose/Rate Route Frequency Ordered Stop   08/21/20 1945  fluconazole (DIFLUCAN) tablet 150 mg        150 mg Oral  Once 08/21/20 1856 08/21/20 1950   08/16/20 1530  meropenem (MERREM) 1 g in sodium chloride 0.9 % 100 mL IVPB  Status:  Discontinued        1 g 200 mL/hr over 30 Minutes Intravenous Every 8 hours 08/16/20 1444 08/23/20 1723       I have personally reviewed the following labs and images: CBC: Recent Labs  Lab 08/20/20 0413 08/22/20 0339 08/24/20 0321  WBC 5.3 8.4 8.5  NEUTROABS 2.3  --   --   HGB 9.6* 9.4* 10.4*  HCT 30.5* 29.5* 33.4*  MCV 93.6 93.4 94.9  PLT 411* 464* 512*   BMP &GFR Recent Labs  Lab 08/20/20 0413 08/20/20 1556 08/21/20 0416 08/22/20 0339 08/23/20 0507 08/24/20 0321 08/25/20 0401  NA 141   < > 141 140 140 140 139  K 2.8*   < > 3.2* 5.0 4.3 4.4 4.5  CL 105   < > 109 109 102 101 103  CO2 23   < > 23 23 28 30 28   GLUCOSE 86   < > 86 91 87 100* 105*  BUN <5*   < > <5* <5* <5* <5* 7  CREATININE 0.69   < > 0.60 0.57 0.64 0.66 0.56  CALCIUM 8.8*   < > 8.5* 9.1 9.4 8.9 9.0  MG 1.3*   < > 2.0 1.8 1.8 1.8 1.9  PHOS 3.4  --   --   --  2.9 3.3 2.9   < >  = values in this interval not displayed.   Estimated Creatinine Clearance: 82.7 mL/min (by C-G formula based on SCr of 0.56 mg/dL). Liver & Pancreas: Recent Labs  Lab 08/20/20 0413 08/23/20 0507 08/24/20 0321  AST 16 20 35  ALT 18 21 30   ALKPHOS 139* 174* 172*  BILITOT 0.3 0.4 0.3  PROT 5.3* 5.5* 6.0*  ALBUMIN 2.5* 2.7* 2.8*   No results for input(s): LIPASE, AMYLASE in the last 168 hours. No results for input(s): AMMONIA in the last 168 hours. Diabetic: No results for input(s): HGBA1C  in the last 72 hours. Recent Labs  Lab 08/24/20 1714 08/24/20 1947 08/24/20 2302 08/25/20 0403 08/25/20 1214  GLUCAP 96 97 106* 98 104*   Cardiac Enzymes: No results for input(s): CKTOTAL, CKMB, CKMBINDEX, TROPONINI in the last 168 hours. No results for input(s): PROBNP in the last 8760 hours. Coagulation Profile: No results for input(s): INR, PROTIME in the last 168 hours. Thyroid Function Tests: No results for input(s): TSH, T4TOTAL, FREET4, T3FREE, THYROIDAB in the last 72 hours. Lipid Profile: Recent Labs    08/24/20 0321  TRIG 140   Anemia Panel: No results for input(s): VITAMINB12, FOLATE, FERRITIN, TIBC, IRON, RETICCTPCT in the last 72 hours. Urine analysis:    Component Value Date/Time   COLORURINE STRAW (A) 08/15/2020 1609   APPEARANCEUR CLEAR 08/15/2020 1609   APPEARANCEUR CLEAR 03/23/2013 1621   LABSPEC 1.009 08/15/2020 1609   LABSPEC 1.021 03/23/2013 1621   PHURINE 6.0 08/15/2020 1609   GLUCOSEU NEGATIVE 08/15/2020 1609   GLUCOSEU see comment 03/23/2013 1621   HGBUR NEGATIVE 08/15/2020 1609   BILIRUBINUR NEGATIVE 08/15/2020 1609   BILIRUBINUR see comment 03/23/2013 1621   KETONESUR NEGATIVE 08/15/2020 1609   PROTEINUR NEGATIVE 08/15/2020 1609   UROBILINOGEN 1.0 09/29/2014 2116   NITRITE NEGATIVE 08/15/2020 1609   LEUKOCYTESUR NEGATIVE 08/15/2020 1609   LEUKOCYTESUR see comment 03/23/2013 1621   Sepsis Labs: Invalid input(s): PROCALCITONIN,  LACTICIDVEN  Microbiology: Recent Results (from the past 240 hour(s))  Resp Panel by RT-PCR (Flu A&B, Covid) Nasopharyngeal Swab     Status: None   Collection Time: 08/15/20  6:05 PM   Specimen: Nasopharyngeal Swab; Nasopharyngeal(NP) swabs in vial transport medium  Result Value Ref Range Status   SARS Coronavirus 2 by RT PCR NEGATIVE NEGATIVE Final    Comment: (NOTE) SARS-CoV-2 target nucleic acids are NOT DETECTED.  The SARS-CoV-2 RNA is generally detectable in upper respiratory specimens during the acute phase of infection. The lowest concentration of SARS-CoV-2 viral copies this assay can detect is 138 copies/mL. A negative result does not preclude SARS-Cov-2 infection and should not be used as the sole basis for treatment or other patient management decisions. A negative result may occur with  improper specimen collection/handling, submission of specimen other than nasopharyngeal swab, presence of viral mutation(s) within the areas targeted by this assay, and inadequate number of viral copies(<138 copies/mL). A negative result must be combined with clinical observations, patient history, and epidemiological information. The expected result is Negative.  Fact Sheet for Patients:  BloggerCourse.com  Fact Sheet for Healthcare Providers:  SeriousBroker.it  This test is no t yet approved or cleared by the [place] FDA and  has been authorized for detection and/or diagnosis of SARS-CoV-2 by FDA under an Emergency Use Authorization (EUA). This EUA will remain  in effect (meaning this test can be used) for the duration of the COVID-19 declaration under Section 564(b)(1) of the Act, 21 U.S.C.section 360bbb-3(b)(1), unless the authorization is terminated  or revoked sooner.       Influenza A by PCR NEGATIVE NEGATIVE Final   Influenza B by PCR NEGATIVE NEGATIVE Final    Comment: (NOTE) The Xpert Xpress SARS-CoV-2/FLU/RSV plus  assay is intended as an aid in the diagnosis of influenza from Nasopharyngeal swab specimens and should not be used as a sole basis for treatment. Nasal washings and aspirates are unacceptable for Xpert Xpress SARS-CoV-2/FLU/RSV testing.  Fact Sheet for Patients: BloggerCourse.com  Fact Sheet for Healthcare Providers: SeriousBroker.it  This test is not yet approved or cleared by the  [place] [employer] trader and has been authorized for detection and/or diagnosis of SARS-CoV-2 by FDA under an TEFL teacher (EUA). This EUA will remain in effect (meaning this test can be used) for the duration of the COVID-19 declaration under Section 564(b)(1) of the Act, 21 U.S.C. section 360bbb-3(b)(1), unless the authorization is terminated or revoked.  Performed at [facility], [address], [place], [place] [address]     Radiology Studies: No results found.    Kameshia Madruga T. Demerius Podolak [facility]  If 7PM-7AM, please contact night-coverage [email or web address] 08/25/2020, 12:22 PM

## 2020-08-25 NOTE — Progress Notes (Signed)
Off unit for CT. A/o x4

## 2020-08-25 NOTE — Progress Notes (Signed)
Tricia Brown 12:35 PM  Subjective: Patient says she is not as good today as yesterday but no diarrhea no new complaints just pain  Objective: Vital signs stable afebrile no acute distress abdomen is still mildly tender throughout without rebound BUN and creatinine okay yesterday white count okay CT improved discussed with Dr. Meridee Score  Assessment: Pancreatitis  Plan: Hopefully we can arrange home TPN but pain control is going to be a big problem she will need to be set up with a pain clinic and will ask rounding team to check on tomorrow  [facility]  office [phone] After 5PM or if no answer call [phone]

## 2020-08-25 NOTE — Progress Notes (Signed)
PHARMACY - TOTAL PARENTERAL NUTRITION CONSULT NOTE  Indication: Complicated pancreatitis with loculated pseudocysts  Patient Measurements: Height: 5\' 7"  (170.2 cm) Weight: 58.4 kg (128 lb 12 oz) IBW/kg (Calculated) : 61.6 TPN AdjBW (KG): 58.4 Body mass index is 20.16 kg/m. 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  Patient returned on 08/15/20 with abdominal pain and progressive phlegmon in the LUQ.  Pharmacy consulted for TPN management as patient is refusing nasal jejunal feeds and failed full liquid diet per Dr. 08/17/20.  Patient endorses a 20-lb weight loss over the past 6 months.  Prior to this admission, she was eating at 25% capacity compared to before the initial onset of pancreatitis.  Glucose / Insulin: no hx DM, A1c 4.6% - BG < 120, 0 units insulin used Electrolytes: CoCa 9.9. All electrolytes wnl.  Renal: SCr ~0.6 stable, BUN < 5 LFTs / TGs: LFT/Tbili wnl; TG 140  Prealbumin / albumin: albumin 2.8, prealbumin 19.4, lipase 95 Intake / Output; MIVF: UOP x4, BM x3   GI Imaging: 12/5 CT Abdomen pending  Surgeries / Procedures: N/A  Central access: 12/3 TPN start date: 12/3  Nutritional Goals (per RD rec on 08/22/20): 2000-2200 kCal, 105-120gm protein per day, fluid > 2L Goal TPN 57ml/hr (50 g/L AA, 30 g/L ILE and 15% CHO)  Current Nutrition:  TPN, CLD  Plan:  Increase TPN  to goal 85 ml/hr (Provides 102g AA, 306g dextrose, 61g lipids, 2060 kcal, about 75% estimated patient needs)   Electrolytes: Continue 75 mEq/L Na, 3 mEq/L Ca, 9 mmol/L Phos, 2061 2:1; Decrease to 10 mEq/L K, 9 mEq/L Mg Add MVI and trace elements in TPN Decrease to sSSI Q8 hr, discontinue if not needing at goal TPN Monitor PO intake, adjust TPN as needed Monitor labs daily until TPN at goal then every Mon/Thur   YB:FXOV, PharmD, BCPS,  BCCP Clinical Pharmacist  Please check AMION for all [facility] Pharmacy phone numbers After 10:00 PM, call Main Pharmacy [phone]

## 2020-08-26 LAB — DIFFERENTIAL
Abs Immature Granulocytes: 0.05 10*3/uL (ref 0.00–0.07)
Basophils Absolute: 0.1 10*3/uL (ref 0.0–0.1)
Basophils Relative: 1 %
Eosinophils Absolute: 0.7 10*3/uL — ABNORMAL HIGH (ref 0.0–0.5)
Eosinophils Relative: 6 %
Immature Granulocytes: 0 %
Lymphocytes Relative: 12 %
Lymphs Abs: 1.3 10*3/uL (ref 0.7–4.0)
Monocytes Absolute: 1 10*3/uL (ref 0.1–1.0)
Monocytes Relative: 8 %
Neutro Abs: 8.4 10*3/uL — ABNORMAL HIGH (ref 1.7–7.7)
Neutrophils Relative %: 73 %

## 2020-08-26 LAB — CBC
HCT: 35.7 % — ABNORMAL LOW (ref 36.0–46.0)
Hemoglobin: 11.1 g/dL — ABNORMAL LOW (ref 12.0–15.0)
MCH: 29.7 pg (ref 26.0–34.0)
MCHC: 31.1 g/dL (ref 30.0–36.0)
MCV: 95.5 fL (ref 80.0–100.0)
Platelets: 463 10*3/uL — ABNORMAL HIGH (ref 150–400)
RBC: 3.74 MIL/uL — ABNORMAL LOW (ref 3.87–5.11)
RDW: 14.1 % (ref 11.5–15.5)
WBC: 11.5 10*3/uL — ABNORMAL HIGH (ref 4.0–10.5)
nRBC: 0 % (ref 0.0–0.2)

## 2020-08-26 LAB — COMPREHENSIVE METABOLIC PANEL
ALT: 18 U/L (ref 0–44)
AST: 14 U/L — ABNORMAL LOW (ref 15–41)
Albumin: 2.8 g/dL — ABNORMAL LOW (ref 3.5–5.0)
Alkaline Phosphatase: 161 U/L — ABNORMAL HIGH (ref 38–126)
Anion gap: 10 (ref 5–15)
BUN: 12 mg/dL (ref 6–20)
CO2: 25 mmol/L (ref 22–32)
Calcium: 9.4 mg/dL (ref 8.9–10.3)
Chloride: 104 mmol/L (ref 98–111)
Creatinine, Ser: 0.61 mg/dL (ref 0.44–1.00)
GFR, Estimated: >60 mL/min (ref >60)
Glucose, Bld: 108 mg/dL — ABNORMAL HIGH (ref 70–99)
Potassium: 3.9 mmol/L (ref 3.5–5.1)
Sodium: 139 mmol/L (ref 135–145)
Total Bilirubin: 0.4 mg/dL (ref 0.3–1.2)
Total Protein: 6.2 g/dL — ABNORMAL LOW (ref 6.5–8.1)

## 2020-08-26 LAB — GLUCOSE, CAPILLARY
Glucose-Capillary: 122 mg/dL — ABNORMAL HIGH (ref 70–99)
Glucose-Capillary: 110 mg/dL — ABNORMAL HIGH (ref 70–99)
Glucose-Capillary: 128 mg/dL — ABNORMAL HIGH (ref 70–99)
Glucose-Capillary: 94 mg/dL (ref 70–99)
Glucose-Capillary: 123 mg/dL — ABNORMAL HIGH (ref 70–99)

## 2020-08-26 LAB — MAGNESIUM: Magnesium: 1.8 mg/dL (ref 1.7–2.4)

## 2020-08-26 LAB — PHOSPHORUS: Phosphorus: 4 mg/dL (ref 2.5–4.6)

## 2020-08-26 LAB — TRIGLYCERIDES: Triglycerides: 257 mg/dL — ABNORMAL HIGH (ref <150)

## 2020-08-26 LAB — PREALBUMIN: Prealbumin: 22.5 mg/dL (ref 18–38)

## 2020-08-26 MED ORDER — TRAVASOL 10 % IV SOLN
INTRAVENOUS | Status: AC
  Filled 2020-08-26: qty 1020

## 2020-08-26 MED ORDER — SENNOSIDES-DOCUSATE SODIUM 8.6-50 MG PO TABS: 1.0000 | ORAL_TABLET | Freq: Two times a day (BID) | ORAL | Status: DC | PRN

## 2020-08-26 MED ORDER — TRAVASOL 10 % IV SOLN
INTRAVENOUS | Status: DC
  Filled 2020-08-26: qty 1020

## 2020-08-26 MED ORDER — POLYETHYLENE GLYCOL 3350 17 G PO PACK
17.0000 g | PACK | Freq: Two times a day (BID) | ORAL | Status: DC | PRN
  Administered 2020-08-27: 17 g via ORAL
  Filled 2020-08-26: qty 1

## 2020-08-26 MED ORDER — MORPHINE SULFATE ER 15 MG PO TBCR
30.0000 mg | EXTENDED_RELEASE_TABLET | Freq: Two times a day (BID) | ORAL | Status: DC
  Administered 2020-08-26 – 2020-08-28 (×5): 30 mg via ORAL
  Filled 2020-08-26 (×5): qty 2

## 2020-08-26 NOTE — Progress Notes (Addendum)
PROGRESS NOTE  Tricia Brown ZOX:[id number] DOB: 1975-10-05   PCP: [name], MD  Patient is from: Home  DOA: 08/15/2020 LOS: 11  Chief complaints: Abdominal pain  Brief Narrative / Interim history: 44 year old female with history of MDD, GERD and recent hospitalization from 10/20-11/17 for necrotizing pancreatitis with pseudocyst requiring AXIOS cystogastrostomy and stent placement and removals, and IV antibiotics returning with abdominal pain.  CT abdomen and pelvis showed new formation of multilocular cyst not amenable to cystogastrostomy at this time per GI.  Repeat CT abdomen and pelvis on 12/5 with improved pseudocyst and pancreatitis but patient's pain not getting better.  She is currently on TPN supplementation, IV pain regimen and Creon.  GI following and suggesting referral to tertiary care center if no clinical improvement in the next few days.  Plan is to discharge home with TPN and p.o. pain medication once pain is fairly controlled.   Subjective: Seen and examined earlier this morning.  No major events overnight of this morning.  Continues to endorse LUQ pain radiating into her left chest and head back.  Denies improvement in her pain.  She denies nausea, vomiting, diarrhea, fever or shortness of breath.  We had a lengthy discussion about pain control.  We have changed oxycodone IR to MS Contin 30 mg twice daily with a plan to reduce IV Dilaudid and add OxyIR for breakthrough pain in the next 1 to 2 days.  I encouraged her to use a IV Dilaudid only for severe breakthrough pain.  Objective: Vitals:   08/25/20 0402 08/25/20 1556 08/25/20 2339 08/26/20 0552  BP: 121/81 121/73 121/75 114/69  Pulse: 84 (!) 106 95 93  Resp: 17 15 17 19   Temp: (!) 97.5 F (36.4 C) 98 F (36.7 C) 98.2 F (36.8 C) 97.8 F (36.6 C)  TempSrc:  Oral Oral   SpO2: 98% 98% 98% 99%  Weight:      Height:        Intake/Output Summary (Last 24 hours) at 08/26/2020 1254 Last data filed at  08/26/2020 1039 Gross per 24 hour  Intake 1768.56 ml  Output --  Net 1768.56 ml   Filed Weights   08/15/20 1325 08/15/20 1413 08/15/20 2104  Weight: 59.9 kg 59.9 kg 58.4 kg    Examination:  GENERAL: No apparent distress.  Nontoxic. HEENT: MMM.  Vision and hearing grossly intact.  NECK: Supple.  No apparent JVD.  RESP: On RA.  No IWOB.  Fair aeration bilaterally. CVS:  RRR. Heart sounds normal.  ABD/GI/GU: BS+. Abd soft.  Tenderness over LUQ. MSK/EXT:  Moves extremities. No apparent deformity. No edema.  SKIN: no apparent skin lesion or wound NEURO: Awake, alert and oriented appropriately.  No apparent focal neuro deficit. PSYCH: Calm. Normal affect.   Procedures:  None  Microbiology summarized: COVID-19 and influenza PCR nonreactive.  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 -MS Contin 30 mg twice daily with IV Dilaudid 1 mg every 4 hours as needed for severe pain. -On TPN, clear liquid diet, creatinine, supplements and PPI -GI following-suggesting referral to tertiary care center if no clinical improvement in the next few days  Acute kidney injury: Resolved. -Monitor intermittently  Hypokalemia: Resolved.  Hypoalbuminemia: due to poor p.o. intake in the setting of #1. -Nutrition following  Iron deficiency anemia: Iron sat 6%.  TIBC, ferritin, folate and B12 within normal.  H&H stable. -Continue monitoring  Nutrition: Poor p.o. intake in the setting of pancreatitis. Body mass index is 20.16 kg/m. Nutrition Problem: Increased nutrient needs Etiology: acute illness (pancreatitis) Signs/Symptoms: estimated needs Interventions: TPN   DVT prophylaxis:   heparin injection 5,000 Units Start: 08/15/20 2200 SCDs Start: 08/15/20 1854  Code Status: Full code Family Communication: Patient and/or RN. Available if any question.  Status is: Inpatient  Remains inpatient appropriate because:Ongoing active pain requiring inpatient pain management, Ongoing diagnostic testing needed not appropriate for outpatient work up, IV treatments appropriate due to intensity of illness or inability to take PO and Inpatient level of care appropriate due to severity of illness   Dispo: The patient is from: Home              Anticipated d/c is to: Home              Anticipated d/c date is: 3 days              Patient currently is not medically stable to d/c.       Consultants:  Gastroenterology   Sch Meds:  Scheduled Meds: . chlorhexidine  15 mL Mouth Rinse BID  . Chlorhexidine Gluconate Cloth  6 each Topical Daily  . colestipol  2 g Oral BID  . dicyclomine  10 mg Oral TID AC & HS  . feeding supplement  1 Container Oral TID BM  . fluticasone  2 spray Each Nare Daily  . heparin  5,000 Units Subcutaneous Q8H  . insulin aspart  0-9 Units Subcutaneous Q8H  . lipase/protease/amylase  36,000 Units Oral TID WC  . mouth rinse  15 mL Mouth Rinse q12n4p  . morphine  30 mg Oral Q12H  . pantoprazole  40 mg Oral Q12H  . sodium chloride flush  10-40 mL Intracatheter Q12H   Continuous Infusions: . TPN ADULT (ION) 85 mL/hr at 08/26/20 0659  . TPN CYCLIC-ADULT (ION)     PRN Meds:.HYDROmorphone (DILAUDID) injection, ondansetron **OR** ondansetron (ZOFRAN) IV, polyethylene glycol, senna-docusate, sodium chloride flush  Antimicrobials: Anti-infectives (From admission, onward)   Start     Dose/Rate Route Frequency Ordered Stop   08/21/20 1945  fluconazole (DIFLUCAN) tablet 150 mg        150 mg Oral  Once 08/21/20 1856 08/21/20 1950   08/16/20 1530  meropenem (MERREM) 1 g in sodium chloride 0.9 % 100 mL IVPB  Status:  Discontinued        1 g 200 mL/hr over 30  Minutes Intravenous Every 8 hours 08/16/20 1444 08/23/20 1723       I have personally reviewed the following labs and images: CBC: Recent Labs  Lab 08/20/20 0413 08/22/20 0339 08/24/20 0321 08/26/20 0428  WBC 5.3 8.4 8.5 11.5*  NEUTROABS 2.3  --   --  8.4*  HGB 9.6* 9.4* 10.4* 11.1*  HCT 30.5* 29.5* 33.4* 35.7*  MCV 93.6 93.4 94.9 95.5  PLT 411* 464* 512* 463*   BMP &GFR Recent Labs  Lab 08/20/20 0413 08/20/20 1556 08/22/20 0339 08/23/20 0507 08/24/20 0321 08/25/20 0401 08/26/20 0428  NA 141   < > 140 140 140 139 139  K 2.8*   < > 5.0 4.3 4.4 4.5 3.9  CL 105   < > 109 102 101 103 104  CO2 23   < > 23 28 30 28 25   GLUCOSE 86   < > 91  87 100* 105* 108*  BUN <5*   < > <5* <5* <5* 7 12  CREATININE 0.69   < > 0.57 0.64 0.66 0.56 0.61  CALCIUM 8.8*   < > 9.1 9.4 8.9 9.0 9.4  MG 1.3*   < > 1.8 1.8 1.8 1.9 1.8  PHOS 3.4  --   --  2.9 3.3 2.9 4.0   < > = values in this interval not displayed.   Estimated Creatinine Clearance: 82.7 mL/min (by C-G formula based on SCr of 0.61 mg/dL). Liver & Pancreas: Recent Labs  Lab 08/20/20 0413 08/23/20 0507 08/24/20 0321 08/26/20 0428  AST 16 20 35 14*  ALT 18 21 30 18   ALKPHOS 139* 174* 172* 161*  BILITOT 0.3 0.4 0.3 0.4  PROT 5.3* 5.5* 6.0* 6.2*  ALBUMIN 2.5* 2.7* 2.8* 2.8*   No results for input(s): LIPASE, AMYLASE in the last 168 hours. No results for input(s): AMMONIA in the last 168 hours. Diabetic: No results for input(s): HGBA1C in the last 72 hours. Recent Labs  Lab 08/25/20 2046 08/25/20 2336 08/26/20 0549 08/26/20 0809 08/26/20 1211  GLUCAP 128* 124* 122* 110* 128*   Cardiac Enzymes: No results for input(s): CKTOTAL, CKMB, CKMBINDEX, TROPONINI in the last 168 hours. No results for input(s): PROBNP in the last 8760 hours. Coagulation Profile: No results for input(s): INR, PROTIME in the last 168 hours. Thyroid Function Tests: No results for input(s): TSH, T4TOTAL, FREET4, T3FREE, THYROIDAB in the last 72  hours. Lipid Profile: Recent Labs    08/24/20 0321 08/26/20 0421  TRIG 140 257*   Anemia Panel: No results for input(s): VITAMINB12, FOLATE, FERRITIN, TIBC, IRON, RETICCTPCT in the last 72 hours. Urine analysis:    Component Value Date/Time   COLORURINE STRAW (A) 08/15/2020 1609   APPEARANCEUR CLEAR 08/15/2020 1609   APPEARANCEUR CLEAR 03/23/2013 1621   LABSPEC 1.009 08/15/2020 1609   LABSPEC 1.021 03/23/2013 1621   PHURINE 6.0 08/15/2020 1609   GLUCOSEU NEGATIVE 08/15/2020 1609   GLUCOSEU see comment 03/23/2013 1621   HGBUR NEGATIVE 08/15/2020 1609   BILIRUBINUR NEGATIVE 08/15/2020 1609   BILIRUBINUR see comment 03/23/2013 1621   KETONESUR NEGATIVE 08/15/2020 1609   PROTEINUR NEGATIVE 08/15/2020 1609   UROBILINOGEN 1.0 09/29/2014 2116   NITRITE NEGATIVE 08/15/2020 1609   LEUKOCYTESUR NEGATIVE 08/15/2020 1609   LEUKOCYTESUR see comment 03/23/2013 1621   Sepsis Labs: Invalid input(s): PROCALCITONIN, LACTICIDVEN  Microbiology: No results found for this or any previous visit (from the past 240 hour(s)).  Radiology Studies: No results found.    Tricia Brown T. Tricia Brown [facility]  If 7PM-7AM, please contact night-coverage [email or web address] 08/26/2020, 12:54 PM

## 2020-08-26 NOTE — Progress Notes (Addendum)
PHARMACY - TOTAL PARENTERAL NUTRITION CONSULT NOTE  Indication: Complicated pancreatitis with loculated pseudocysts  Patient Measurements: Height: 5\' 7"  (170.2 cm) Weight: 58.4 kg (128 lb 12 oz) IBW/kg (Calculated) : 61.6 TPN AdjBW (KG): 58.4 Body mass index is 20.16 kg/m. Usual Weight: 61-64 kg last admit, now 58.4 kg  Assessment:  63 YOF recently admitted 10/20-11/17 with necrotizing pancreatitis with infected pseudocyst s/p cystgastrostomy and then stent removal.  Patient returned on 08/15/20 with abdominal pain and progressive phlegmon in the LUQ.  Pharmacy consulted for TPN management as patient is refusing nasal jejunal feeds and failed full liquid diet per Dr. Michail Sermon.  Patient endorses a 20-lb weight loss over the past 6 months.  Prior to this admission, she was eating at 25% capacity compared to before the initial onset of pancreatitis.  Glucose / Insulin: no hx DM, A1c 4.6% - BG < 130, 2 units insulin used Electrolytes: CoCa 9.9. All electrolytes wnl- K down, Phos up  Renal: SCr ~0.6 stable, BUN 12 LFTs / TGs: LFT/Tbili wnl; alk phos 161; TG 257 (drawn while lipids running)  Prealbumin / albumin: albumin 2.8, prealbumin 19.4, lipase 95 Intake / Output; MIVF: incompletely documented GI Imaging: 12/5 CT Abdomen -Small fluid collections and inflammatory changes in the left upper quadrant of the abdomen have decreased since 08/15/2020. Stable pseudocyst/cystic structure in the distal pancreatic body region. Surgeries / Procedures: N/A  Central access: 12/3 TPN start date: 12/3  Nutritional Goals (per RD rec on 08/22/20): 2000-2200 kCal, 105-120gm protein per day, fluid > 2L Goal TPN 74ml/hr (50 g/L AA, 30 g/L ILE and 15% CHO)  Current Nutrition:  TPN, CLD  Plan:  Decreased lipid due to national shortage- Cycle TPN over 18 hours (60 ml/hr x1 hr at first and last hour, 131ml/hr x14 hr). Will provide 102g AA, 388g dextrose, 30g lipids, 2031 kcal, meeting 100% estimated  patient needs Electrolytes: Increase to 12 mEq/L K, 12 mEq/L Mg; decrease to 12 mmol/L Phos; Continue 75 mEq/L Na, 3 mEq/L Ca, AN:VBTY 2:1 Add MVI and trace elements in TPN Continue sSSI Q8 hr while cycling, discontinue if low use  Monitor PO intake, adjust TPN as needed Monitor labs daily while cycling then every Mon/Thur   Benetta Spar, PharmD, BCPS, BCCP Clinical Pharmacist  Please check AMION for all [place] phone numbers After 10:00 PM, call [place]

## 2020-08-26 NOTE — Progress Notes (Signed)
[facility] Gastroenterology Progress Note  Tricia Brown 44 y.o. May 07, 1976  CC: Abdominal pain, history of necrotizing pancreatitis   Subjective: Patient seen and examined at bedside.  She is complaining of epigastric as well as left upper quadrant abdominal pain radiating towards chest.  Denies nausea or vomiting.  Denies diarrhea.  Denies blood in the stool.  ROS : Afebrile.  Positive for abdominal pain and chest pain.   Objective: Vital signs in last 24 hours: Vitals:   08/25/20 2339 08/26/20 0552  BP: 121/75 114/69  Pulse: 95 93  Resp: 17 19  Temp: 98.2 F (36.8 C) 97.8 F (36.6 C)  SpO2: 98% 99%    Physical Exam:  General:  Alert, cooperative, no distress, appears stated age  Head:  Normocephalic, without obvious abnormality, atraumatic  Eyes:  , EOM's intact,   Lungs:   Clear to auscultation bilaterally, respirations unlabored  Heart:  Regular rate and rhythm, S1, S2 normal  Abdomen:   Soft, epigastric and left upper quadrant tenderness to palpation, abdomen is soft, bowel sounds present.  Extremities: Extremities normal, atraumatic, no  edema  Pulses: 2+ and symmetric    Lab Results: Recent Labs    08/25/20 0401 08/26/20 0428  NA 139 139  K 4.5 3.9  CL 103 104  CO2 28 25  GLUCOSE 105* 108*  BUN 7 12  CREATININE 0.56 0.61  CALCIUM 9.0 9.4  MG 1.9 1.8  PHOS 2.9 4.0   Recent Labs    08/24/20 0321 08/26/20 0428  AST 35 14*  ALT 30 18  ALKPHOS 172* 161*  BILITOT 0.3 0.4  PROT 6.0* 6.2*  ALBUMIN 2.8* 2.8*   Recent Labs    08/24/20 0321 08/26/20 0428  WBC 8.5 11.5*  NEUTROABS  --  8.4*  HGB 10.4* 11.1*  HCT 33.4* 35.7*  MCV 94.9 95.5  PLT 512* 463*   No results for input(s): LABPROT, INR in the last 72 hours.    Assessment/Plan: -Abdominal pain probably from recent pancreatitis. -History of necrotizing pancreatitis with pseudocyst formation.  Status post cystogastrostomy with axial stent placement on July 22, 2020.  Repeat EUS on  November 12 showed significant improvement in the pseudocyst and hence stent was removed. -Chest pain radiating from left upper quadrant  Recommendations ----------------------- -Continue supportive care.  Currently on TPN and on clear liquid diet.  Poor oral intake because of decreased appetite and abdominal pain. -Continue Creon -Continue Protonix twice daily.  Add Carafate for substernal chest pain. -Repeat CT scan showed improvement in pseudocyst and pancreatitis but patient's pain is not getting better.  May consider referral to tertiary care center if no clinical improvement in next few days.   Kathi Der MD, FACP 08/26/2020, 9:42 AM  Contact #  [phone]

## 2020-08-26 NOTE — Plan of Care (Signed)
  Problem: Health Behavior/Discharge Planning: Goal: Ability to manage health-related needs will improve Outcome: Progressing   Problem: Clinical Measurements: Goal: Will remain free from infection Outcome: Progressing   Problem: Elimination: Goal: Will not experience complications related to bowel motility Outcome: Progressing Goal: Will not experience complications related to urinary retention Outcome: Progressing

## 2020-08-27 LAB — BASIC METABOLIC PANEL
Anion gap: 8 (ref 5–15)
BUN: 15 mg/dL (ref 6–20)
CO2: 22 mmol/L (ref 22–32)
Calcium: 9.2 mg/dL (ref 8.9–10.3)
Chloride: 110 mmol/L (ref 98–111)
Creatinine, Ser: 0.52 mg/dL (ref 0.44–1.00)
GFR, Estimated: >60 mL/min (ref >60)
Glucose, Bld: 118 mg/dL — ABNORMAL HIGH (ref 70–99)
Potassium: 4.2 mmol/L (ref 3.5–5.1)
Sodium: 140 mmol/L (ref 135–145)

## 2020-08-27 LAB — GLUCOSE, CAPILLARY
Glucose-Capillary: 102 mg/dL — ABNORMAL HIGH (ref 70–99)
Glucose-Capillary: 121 mg/dL — ABNORMAL HIGH (ref 70–99)
Glucose-Capillary: 126 mg/dL — ABNORMAL HIGH (ref 70–99)
Glucose-Capillary: 138 mg/dL — ABNORMAL HIGH (ref 70–99)
Glucose-Capillary: 75 mg/dL (ref 70–99)
Glucose-Capillary: 86 mg/dL (ref 70–99)
Glucose-Capillary: 88 mg/dL (ref 70–99)

## 2020-08-27 LAB — MAGNESIUM: Magnesium: 2 mg/dL (ref 1.7–2.4)

## 2020-08-27 LAB — PHOSPHORUS: Phosphorus: 3.9 mg/dL (ref 2.5–4.6)

## 2020-08-27 MED ORDER — SODIUM CHLORIDE 0.9 % IV SOLN
510.0000 mg | Freq: Once | INTRAVENOUS | Status: AC
  Administered 2020-08-27: 510 mg via INTRAVENOUS
  Filled 2020-08-27: qty 17

## 2020-08-27 MED ORDER — HYDROMORPHONE HCL 1 MG/ML IJ SOLN
0.5000 mg | INTRAMUSCULAR | Status: DC | PRN
  Administered 2020-08-27: 0.5 mg via INTRAVENOUS
  Filled 2020-08-27: qty 1

## 2020-08-27 MED ORDER — TRAVASOL 10 % IV SOLN
INTRAVENOUS | Status: DC
  Filled 2020-08-27: qty 1020

## 2020-08-27 MED ORDER — OXYCODONE HCL 5 MG PO TABS
10.0000 mg | ORAL_TABLET | ORAL | Status: DC | PRN
  Administered 2020-08-27 – 2020-08-28 (×5): 10 mg via ORAL
  Filled 2020-08-27 (×5): qty 2

## 2020-08-27 NOTE — Progress Notes (Signed)
[facility] Gastroenterology Progress Note  Tricia Brown 44 y.o. Feb 08, 1976  CC: Abdominal pain, history of necrotizing pancreatitis   Subjective: Patient seen and examined at bedside.  She is feeling better today.  Denies abdominal pain.  Had some nausea but denies any vomiting.  Tolerating clear liquid diet.  Denies any diarrhea or blood in the stool.  ROS : Afebrile.  Negative for chest pain.   Objective: Vital signs in last 24 hours: Vitals:   08/26/20 2031 08/27/20 0421  BP: 108/65 108/61  Pulse: 91 82  Resp: 17 15  Temp: 98.2 F (36.8 C) 97.8 F (36.6 C)  SpO2: 100% 99%    Physical Exam:  General:  Alert, cooperative, no distress, appears stated age  Head:  Normocephalic, without obvious abnormality, atraumatic  Eyes:  , EOM's intact,   Lungs:   Clear to auscultation bilaterally, respirations unlabored  Heart:  Regular rate and rhythm, S1, S2 normal  Abdomen:   Soft, epigastric discomfort on palpation, no significant tenderness, abdomen is soft, bowel sounds present.  Extremities: Extremities normal, atraumatic, no  edema  Pulses: 2+ and symmetric    Lab Results: Recent Labs    08/26/20 0428 08/27/20 0325  NA 139 140  K 3.9 4.2  CL 104 110  CO2 25 22  GLUCOSE 108* 118*  BUN 12 15  CREATININE 0.61 0.52  CALCIUM 9.4 9.2  MG 1.8 2.0  PHOS 4.0 3.9   Recent Labs    08/26/20 0428  AST 14*  ALT 18  ALKPHOS 161*  BILITOT 0.4  PROT 6.2*  ALBUMIN 2.8*   Recent Labs    08/26/20 0428  WBC 11.5*  NEUTROABS 8.4*  HGB 11.1*  HCT 35.7*  MCV 95.5  PLT 463*   No results for input(s): LABPROT, INR in the last 72 hours.    Assessment/Plan: -Abdominal pain probably from recent pancreatitis. -History of necrotizing pancreatitis with pseudocyst formation.  Status post cystogastrostomy with axial stent placement on July 22, 2020.  Repeat EUS on November 12 showed significant improvement in the pseudocyst and hence stent was removed. -Chest pain radiating  from left upper quadrant  Recommendations ----------------------- -Continue supportive care.  Currently on TPN -Advance diet to full liquid -Continue Creon -Continue Protonix twice daily.  Continue Carafate for substernal chest pain. -Repeat CT scan showed improvement in pseudocyst and pancreatitis but patient's pain is not getting better.  May consider referral to tertiary care center if no clinical improvement in next few days.   Kathi Der MD, FACP 08/27/2020, 11:15 AM  Contact #  [phone]

## 2020-08-27 NOTE — Progress Notes (Signed)
PHARMACY - TOTAL PARENTERAL NUTRITION CONSULT NOTE  Indication: Complicated pancreatitis with loculated pseudocysts  Patient Measurements: Height: _0  (170.2 cm) Weight: 58.4 kg (128 lb 12 oz) IBW/kg (Calculated) : 61.6 TPN AdjBW (KG): 58.4 Body mass index is 20.16 kg/m. Usual Weight: 61-64 kg last admit, now 58.4 kg  Assessment:  89 YOF recently admitted 10/20-11/17 with necrotizing pancreatitis with infected pseudocyst s/p cystgastrostomy and then stent removal.  Patient returned on 08/15/20 with abdominal pain and progressive phlegmon in the LUQ.  Pharmacy consulted for TPN management as patient is refusing nasal jejunal feeds and failed full liquid diet per Dr. Michail Sermon.  Patient endorses a 20-lb weight loss over the past 6 months.  Prior to this admission, she was eating at 25% capacity compared to before the initial onset of pancreatitis.  Glucose / Insulin: no hx DM, A1c 4.6% - BG < 140, 3 units insulin used Electrolytes: Cl 110. Other electrolytes wnl.  Renal: SCr 0.52 stable, BUN 15 LFTs / TGs: LFT/Tbili wnl; alk phos 161; TG 257 (drawn while lipids running)  Prealbumin / albumin: albumin 2.8, prealbumin 19.4, lipase 95 Intake / Output; MIVF: incompletely documented GI Imaging: 12/5 CT Abdomen -Small fluid collections and inflammatory changes in the left upper quadrant of the abdomen have decreased since 08/15/2020. Stable pseudocyst/cystic structure in the distal pancreatic body region. Surgeries / Procedures: N/A  Central access: 12/3 TPN start date: 12/3  Nutritional Goals (per RD rec on 08/22/20): 2000-2200 kCal, 105-120gm protein per day, fluid > 2L  Current Nutrition:  TPN  CLD per pt ate 1/2 broth at lunch and 1 whole cup of broth at dinner  Plan:  Continue cyclic TPN over 18 hours - 60 ml/hr x1 hr at first and last hour, 168m/hr x14 hr - Will provide 102g AA, 388g dextrose, 30g lipids, 2031 kcal, meeting 100% estimated patient needs If tolerates likely can  decrease cyclic hour interval tomorrow  Electrolytes: Continue 12 mEq/L of K, 12 mEq/L of Mg, 12 mmol/L of phos, 75 mEq/L of Na, 3 mEq/L of Ca. Adjust Cl:Ac to 1:1 Add MVI and trace elements in TPN Continue sSSI Q8 hr while cycling Monitor PO intake, adjust TPN as needed Monitor labs daily while cycling then every Mon/Thur Discussed with [name] with [place]to coordinate TPN on discharge tomorrow and everything is all set   GCristela Felt PharmD Clinical Pharmacist

## 2020-08-27 NOTE — Plan of Care (Signed)
  Problem: Health Behavior/Discharge Planning: Goal: Ability to manage health-related needs will improve Outcome: Progressing   Problem: Clinical Measurements: Goal: Will remain free from infection Outcome: Progressing   Problem: Elimination: Goal: Will not experience complications related to bowel motility Outcome: Progressing Goal: Will not experience complications related to urinary retention Outcome: Progressing   Problem: Pain Managment: Goal: General experience of comfort will improve Outcome: Progressing   Problem: Safety: Goal: Ability to remain free from injury will improve Outcome: Progressing   Problem: Education: Goal: Knowledge of Pancreatitis treatment and prevention will improve Outcome: Progressing   Problem: Health Behavior/Discharge Planning: Goal: Ability to formulate a plan to maintain an alcohol-free life will improve Outcome: Progressing   Problem: Nutritional: Goal: Ability to achieve adequate nutritional intake will improve Outcome: Progressing   Problem: Clinical Measurements: Goal: Complications related to the disease process, condition or treatment will be avoided or minimized Outcome: Progressing

## 2020-08-27 NOTE — Progress Notes (Signed)
PROGRESS NOTE  Tricia Brown IEP:[id number] DOB: [birth year]   PCP: [name], MD  Patient is from: Home  DOA: 08/15/2020 LOS: 12  Chief complaints: Abdominal pain  Brief Narrative / Interim history: 44 year old female with history of MDD, GERD and recent hospitalization from 10/20-11/17 for necrotizing pancreatitis with pseudocyst requiring AXIOS cystogastrostomy and stent placement and removals, and IV antibiotics returning with abdominal pain.  CT abdomen and pelvis showed new formation of multilocular cyst not amenable to cystogastrostomy at this time per GI.  Repeat CT abdomen and pelvis on 12/5 with improved pseudocyst and pancreatitis but patient's pain not getting better.  She is currently on TPN supplementation, IV pain regimen and Creon.  GI following and suggesting referral to tertiary care center if no clinical improvement in the next few days.  Plan is to discharge home with TPN and p.o. pain medication once pain is fairly controlled, likely on 12/8.   Subjective: Seen and examined earlier this morning.  No major events overnight or this morning.  Reports improvement in her pain but required 4 doses of IV Dilaudid yesterday, and 2 doses since midnight.  Still with LUQ pain radiating into left chest.  Denies nausea, vomiting, diarrhea or dyspnea.  Optimistic about going home tomorrow if pain is fairly controlled on p.o. regimen. She plans to stay away from IV Dilaudid as much as possible.  Objective: Vitals:   08/26/20 1520 08/26/20 2031 08/27/20 0421 08/27/20 1338  BP: 105/70 108/65 108/61 102/67  Pulse: 100 91 82 93  Resp: 17 17 15 18   Temp: 97.8 F (36.6 C) 98.2 F (36.8 C) 97.8 F (36.6 C) 97.9 F (36.6 C)  TempSrc: Oral Axillary Oral   SpO2: 100% 100% 99% 100%  Weight:      Height:        Intake/Output Summary (Last 24 hours) at 08/27/2020 1340 Last data filed at 08/27/2020 0900 Gross per 24 hour  Intake 1979.38 ml  Output --  Net 1979.38 ml   Filed  Weights   08/15/20 1325 08/15/20 1413 08/15/20 2104  Weight: 59.9 kg 59.9 kg 58.4 kg    Examination:  GENERAL: No apparent distress.  Nontoxic. HEENT: MMM.  Vision and hearing grossly intact.  NECK: Supple.  No apparent JVD.  RESP: On RA.  No IWOB.  Fair aeration bilaterally. CVS:  RRR. Heart sounds normal.  ABD/GI/GU: BS+. Abd soft.  Mild tenderness over RUQ MSK/EXT:  Moves extremities. No apparent deformity. No edema.  SKIN: no apparent skin lesion or wound NEURO: Awake, alert and oriented appropriately.  No apparent focal neuro deficit. PSYCH: Calm. Normal affect.   Procedures:  None  Microbiology summarized: COVID-19 and influenza PCR nonreactive.  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 -MS Contin 30 mg twice daily -Oxycodone IR 10 mg every 4 hours as needed severe pain -Decrease IV Dilaudid to 0.5 mg every 4 hours as needed for breakthrough pain not controlled with the above -Continue Protonix and Carafate -GI advancing diet to full liquid -Continue TPN  Acute kidney injury: Resolved. -Monitor intermittently  Hypokalemia: Resolved.  Hypoalbuminemia: due to poor p.o. intake in the setting of #1. -Nutrition following  Iron deficiency anemia: Iron sat 6%.  TIBC, ferritin, folate and B12 within normal.  H&H stable. -May need Feraheme  if no sufficient iron in TPN.  Will discuss with pharmacy. -Continue monitoring  Nutrition: Poor p.o. intake in the setting of pancreatitis. Body mass index is 20.16 kg/m. Nutrition Problem: Increased nutrient needs Etiology: acute illness (pancreatitis) Signs/Symptoms: estimated needs Interventions: TPN   DVT  prophylaxis:  heparin injection 5,000 Units Start: 08/15/20 2200 SCDs Start: 08/15/20 1854  Code Status: Full code Family Communication: Patient and/or RN. Available if any question.  Status is: Inpatient  Remains inpatient appropriate because:Ongoing active pain requiring inpatient pain management, Ongoing diagnostic testing needed not appropriate for outpatient work up, IV treatments appropriate due to intensity of illness or inability to take PO and Inpatient level of care appropriate due to severity of illness   Dispo: The patient is from: Home              Anticipated d/c is to: Home              Anticipated d/c date is: 1 day              Patient currently is not medically stable to d/c.       Consultants:  Gastroenterology   Sch Meds:  Scheduled Meds: . chlorhexidine  15 mL Mouth Rinse BID  . Chlorhexidine Gluconate Cloth  6 each Topical Daily  . colestipol  2 g Oral BID  . dicyclomine  10 mg Oral TID AC & HS  . feeding supplement  1 Container Oral TID BM  . fluticasone  2 spray Each Nare Daily  . heparin  5,000 Units Subcutaneous Q8H  . insulin aspart  0-9 Units Subcutaneous Q8H  . lipase/protease/amylase  36,000 Units Oral TID WC  . morphine  30 mg Oral Q12H  . pantoprazole  40 mg Oral Q12H  . sodium chloride flush  10-40 mL Intracatheter Q12H   Continuous Infusions: . TPN CYCLIC-ADULT (ION)     PRN Meds:.HYDROmorphone (DILAUDID) injection, ondansetron **OR** ondansetron (ZOFRAN) IV, oxyCODONE, polyethylene glycol, senna-docusate, sodium chloride flush  Antimicrobials: Anti-infectives (From admission, onward)   Start     Dose/Rate Route Frequency Ordered Stop   08/21/20 1945  fluconazole (DIFLUCAN) tablet 150 mg        150 mg Oral  Once 08/21/20 1856 08/21/20 1950   08/16/20 1530  meropenem (MERREM) 1 g in sodium chloride 0.9 % 100 mL IVPB  Status:  Discontinued        1 g 200 mL/hr over 30 Minutes Intravenous Every 8 hours 08/16/20 1444 08/23/20 1723        I have personally reviewed the following labs and images: CBC: Recent Labs  Lab 08/22/20 0339 08/24/20 0321 08/26/20 0428  WBC 8.4 8.5 11.5*  NEUTROABS  --   --  8.4*  HGB 9.4* 10.4* 11.1*  HCT 29.5* 33.4* 35.7*  MCV 93.4 94.9 95.5  PLT 464* 512* 463*   BMP &GFR Recent Labs  Lab 08/23/20 0507 08/24/20 0321 08/25/20 0401 08/26/20 0428 08/27/20 0325  NA 140 140 139 139 140  K 4.3 4.4 4.5 3.9 4.2  CL 102 101 103 104 110  CO2 28 30 28 25 22   GLUCOSE 87 100* 105* 108* 118*  BUN <5* <5* 7 12 15   CREATININE 0.64 0.66 0.56 0.61 0.52  CALCIUM 9.4 8.9 9.0 9.4 9.2  MG 1.8 1.8 1.9 1.8 2.0  PHOS 2.9 3.3 2.9 4.0 3.9   Estimated Creatinine Clearance: 82.7 mL/min (by C-G formula based on SCr of 0.52 mg/dL). Liver & Pancreas: Recent Labs  Lab  08/23/20 0507 08/24/20 0321 08/26/20 0428  AST 20 35 14*  ALT 21 30 18   ALKPHOS 174* 172* 161*  BILITOT 0.4 0.3 0.4  PROT 5.5* 6.0* 6.2*  ALBUMIN 2.7* 2.8* 2.8*   No results for input(s): LIPASE, AMYLASE in the last 168 hours. No results for input(s): AMMONIA in the last 168 hours. Diabetic: No results for input(s): HGBA1C in the last 72 hours. Recent Labs  Lab 08/26/20 2030 08/27/20 0002 08/27/20 0421 08/27/20 0725 08/27/20 1126  GLUCAP 123* 126* 138* 121* 88   Cardiac Enzymes: No results for input(s): CKTOTAL, CKMB, CKMBINDEX, TROPONINI in the last 168 hours. No results for input(s): PROBNP in the last 8760 hours. Coagulation Profile: No results for input(s): INR, PROTIME in the last 168 hours. Thyroid Function Tests: No results for input(s): TSH, T4TOTAL, FREET4, T3FREE, THYROIDAB in the last 72 hours. Lipid Profile: Recent Labs    08/26/20 0421  TRIG 257*   Anemia Panel: No results for input(s): VITAMINB12, FOLATE, FERRITIN, TIBC, IRON, RETICCTPCT in the last 72 hours. Urine analysis:    Component Value Date/Time   COLORURINE STRAW (A) 08/15/2020 1609   APPEARANCEUR CLEAR 08/15/2020 1609   APPEARANCEUR  CLEAR 03/23/2013 1621   LABSPEC 1.009 08/15/2020 1609   LABSPEC 1.021 03/23/2013 1621   PHURINE 6.0 08/15/2020 1609   GLUCOSEU NEGATIVE 08/15/2020 1609   GLUCOSEU see comment 03/23/2013 1621   HGBUR NEGATIVE 08/15/2020 1609   BILIRUBINUR NEGATIVE 08/15/2020 1609   BILIRUBINUR see comment 03/23/2013 1621   KETONESUR NEGATIVE 08/15/2020 1609   PROTEINUR NEGATIVE 08/15/2020 1609   UROBILINOGEN 1.0 09/29/2014 2116   NITRITE NEGATIVE 08/15/2020 1609   LEUKOCYTESUR NEGATIVE 08/15/2020 1609   LEUKOCYTESUR see comment 03/23/2013 1621   Sepsis Labs: Invalid input(s): PROCALCITONIN, LACTICIDVEN  Microbiology: No results found for this or any previous visit (from the past 240 hour(s)).  Radiology Studies: No results found.    Revere Maahs T. Tyreece Gelles [facility]  If 7PM-7AM, please contact night-coverage [email or web address] 08/27/2020, 1:40 PM

## 2020-08-28 DIAGNOSIS — R1013 Epigastric pain: Secondary | ICD-10-CM

## 2020-08-28 DIAGNOSIS — E8809 Other disorders of plasma-protein metabolism, not elsewhere classified: Secondary | ICD-10-CM

## 2020-08-28 DIAGNOSIS — K8591 Acute pancreatitis with uninfected necrosis, unspecified: Secondary | ICD-10-CM

## 2020-08-28 DIAGNOSIS — Z8659 Personal history of other mental and behavioral disorders: Secondary | ICD-10-CM

## 2020-08-28 LAB — GLUCOSE, CAPILLARY
Glucose-Capillary: 137 mg/dL — ABNORMAL HIGH (ref 70–99)
Glucose-Capillary: 109 mg/dL — ABNORMAL HIGH (ref 70–99)
Glucose-Capillary: 112 mg/dL — ABNORMAL HIGH (ref 70–99)

## 2020-08-28 LAB — BASIC METABOLIC PANEL
Anion gap: 8 (ref 5–15)
BUN: 19 mg/dL (ref 6–20)
CO2: 21 mmol/L — ABNORMAL LOW (ref 22–32)
Calcium: 9.3 mg/dL (ref 8.9–10.3)
Chloride: 110 mmol/L (ref 98–111)
Creatinine, Ser: 0.53 mg/dL (ref 0.44–1.00)
GFR, Estimated: >60 mL/min (ref >60)
Glucose, Bld: 110 mg/dL — ABNORMAL HIGH (ref 70–99)
Potassium: 4.1 mmol/L (ref 3.5–5.1)
Sodium: 139 mmol/L (ref 135–145)

## 2020-08-28 LAB — PHOSPHORUS: Phosphorus: 3.3 mg/dL (ref 2.5–4.6)

## 2020-08-28 LAB — MAGNESIUM: Magnesium: 2 mg/dL (ref 1.7–2.4)

## 2020-08-28 MED ORDER — MORPHINE SULFATE ER 15 MG PO TBCR: 15.0000 mg | EXTENDED_RELEASE_TABLET | Freq: Two times a day (BID) | ORAL | 0 refills | Status: AC

## 2020-08-28 MED ORDER — HEPARIN SOD (PORK) LOCK FLUSH 100 UNIT/ML IV SOLN
250.0000 [IU] | Freq: Every day | INTRAVENOUS | Status: DC
  Administered 2020-08-28: 250 [IU]

## 2020-08-28 MED ORDER — OXYCODONE HCL 10 MG PO TABS: 10.0000 mg | ORAL_TABLET | Freq: Four times a day (QID) | ORAL | 0 refills | Status: AC | PRN

## 2020-08-28 MED ORDER — POLYETHYLENE GLYCOL 3350 17 GM/SCOOP PO POWD: 17.0000 g | Freq: Two times a day (BID) | ORAL | 1 refills | Status: DC | PRN

## 2020-08-28 MED ORDER — HEPARIN SOD (PORK) LOCK FLUSH 100 UNIT/ML IV SOLN
250.0000 [IU] | INTRAVENOUS | Status: DC | PRN
  Administered 2020-08-28: 250 [IU]
  Filled 2020-08-28: qty 2.5

## 2020-08-28 MED ORDER — SENNOSIDES-DOCUSATE SODIUM 8.6-50 MG PO TABS: 1.0000 | ORAL_TABLET | Freq: Two times a day (BID) | ORAL | Status: DC | PRN

## 2020-08-28 NOTE — Discharge Summary (Signed)
Physician Discharge Summary  Tricia Brown WUJ:[id number] DOB: 08-Sep-1976 DOA: 08/15/2020  PCP: [name], MD  Admit date: 08/15/2020 Discharge date: 08/28/2020  Admitted From: Home Disposition: Home  Recommendations for Outpatient Follow-up:  1. Follow ups as below. 2. Please obtain CBC/BMP/Mag at follow up 3. Please follow up on the following pending results: None  Home Health: RN Equipment/Devices: TPN  Discharge Condition: Stable CODE STATUS: Full code   Hospital Course: 44 year old female with history of MDD, GERD and recent hospitalization from 10/20-11/17 for necrotizing pancreatitis with pseudocyst requiring AXIOS cystogastrostomy and stent placement and removals after improvement, and IV antibiotics returning with abdominal pain.  CT abdomen and pelvis showed new formation of multilocular cyst not amenable to cystogastrostomy at this time per GI.  She was a started on IV meropenem.  Repeat CT abdomen and pelvis on 12/5 with improved pseudocyst and pancreatitis.  Pain eventually improved, and she came off IV pain medication with scheduled MS Contin and as needed oxycodone.  She also tolerated full liquid diet but discharged on TPN for supplementation.  She was counseled on the potential adverse effects of opiates including sedation, respiratory distress, increased risk of fall and constipation.  She was prescribed bowel regimen.  Advised not to drive or operate machinery while taking opiate medications.  Also advised to minimum dose to take the pain edge off.  Patient to follow-up with PCP and gastroenterologist outpatient.  See individual problem list below for more on hospital course.  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 -MS Contin 15 mg twice daily, oxycodone IR 10 mg every 6 hours as needed breakthrough pain -Continue Protonix 40 mg twice daily -MiraLAX and Senokot-S for pain control -Full liquid diet -TPN for 18 hours daily -HH RN for home TPN.  History of sinus tachycardia: Reports taking metoprolol for "fast heart rate".  She did not require this during this hospitalization. -Metoprolol discontinued on discharge.  Acute kidney injury: Resolved. -Monitor intermittently  Hypokalemia: Resolved.  History of major depression: Stable.  Does not seem to be medication. -Could benefit from outpatient psychiatry follow-up  Iron deficiency anemia: Iron sat 6%.  TIBC, ferritin, folate and B12 within normal.  H&H stable. -Received IV Feraheme on 12/7. -Multivitamin and p.o. ferrous sulfate -Continue monitoring  Nutrition: Increased nutrient needs due to pancreatitis Hypoalbuminemia: Due to poor p.o. intake Body mass index is 20.16 kg/m. Nutrition Problem: Increased nutrient needs Etiology: acute illness (pancreatitis) Signs/Symptoms: estimated needs Interventions: TPN      Discharge Exam: Vitals:   08/27/20 2007 08/28/20 0838  BP: 106/65 107/65  Pulse: 90 88  Resp: 16 17  Temp: (!) 97.5 F (36.4 C) 98.1 F (36.7 C)  SpO2: 100% 99%    GENERAL: No apparent distress.  Nontoxic. HEENT: MMM.  Vision and hearing grossly intact.  NECK: Supple.  No apparent JVD.  RESP:  No IWOB.  Fair aeration bilaterally. CVS:  RRR. Heart sounds normal.  ABD/GI/GU: Bowel sounds present. Soft. Non tender but mild discomfort over epigastric and LUQ. MSK/EXT:  Moves extremities. No apparent deformity. No edema.  SKIN: no apparent skin lesion or wound NEURO: Awake, alert and oriented appropriately.  No apparent focal neuro deficit. PSYCH: Calm. Normal affect.  Discharge Instructions  Discharge Instructions    Call  MD for:  difficulty  breathing, headache or visual disturbances   Complete by: As directed    Call MD for:  extreme fatigue   Complete by: As directed    Call MD for:  persistant dizziness or light-headedness   Complete by: As directed    Call MD for:  persistant nausea and vomiting   Complete by: As directed    Call MD for:  severe uncontrolled pain   Complete by: As directed    Call MD for:  temperature >100.4   Complete by: As directed    Diet full liquid   Complete by: As directed    Discharge instructions   Complete by: As directed    It has been a pleasure taking care of you!  You were hospitalized due to abdominal pain likely from pancreatitis.  Your symptoms improved to the point we think it is safe to let you go home and follow-up with your doctors.  We are discharging you on couple of pain medications.  Please be cautious with these pain medications and they can alter mentation, increase your risk of fall, respiratory distress and constipation.  We also recommend taking Senokot and/or MiraLAX to avoid constipation.  Please review your new medication list and the directions on your medications before you take them.  Follow-up with your primary care doctor and gastroenterologist in 1 to 2 weeks.   Take care,   Increase activity slowly   Complete by: As directed      Allergies as of 08/28/2020   No Known Allergies     Medication List    STOP taking these medications   metoprolol succinate 50 MG 24 hr tablet Commonly known as: TOPROL-XL     TAKE these medications   acetaminophen 500 MG tablet Commonly known as: TYLENOL Take 2 tablets (1,000 mg total) by mouth every 8 (eight) hours as needed for mild pain or fever.   budesonide-formoterol 80-4.5 MCG/ACT inhaler Commonly known as: SYMBICORT Inhale 1 puff into the lungs daily.   colestipol 1 g tablet Commonly known as: COLESTID Take 1 tablet (1 g total) by mouth 2 (two) times daily.   dicyclomine 10 MG capsule Commonly known as:  BENTYL Take 1 capsule (10 mg total) by mouth 4 (four) times daily as needed for spasms.   estradiol 1 MG tablet Commonly known as: ESTRACE Take 1 mg by mouth daily.   feeding supplement Liqd Take 237 mLs by mouth 2 (two) times daily between meals.   fexofenadine 180 MG tablet Commonly known as: ALLEGRA Take 180 mg by mouth as needed for allergies.   lipase/protease/amylase 15726 UNITS Cpep capsule Commonly known as: CREON Take 2 capsules (72,000 Units total) by mouth daily.   montelukast 10 MG tablet Commonly known as: SINGULAIR Take 10 mg by mouth daily.   morphine 15 MG 12 hr tablet Commonly known as: MS Contin Take 1 tablet (15 mg total) by mouth every 12 (twelve) hours for 7 days.   MULTIVITAMIN ADULT PO Take 1 tablet by mouth daily.   ondansetron 4 MG tablet Commonly known as: ZOFRAN Take 1 tablet (4 mg total) by mouth every 8 (eight) hours as needed for nausea or vomiting.   Oxycodone HCl 10 MG Tabs Take 1 tablet (10 mg total) by mouth every 6 (six) hours as needed for up to 5 days for severe pain or breakthrough pain. What changed:   medication strength  how much to take  when to take this  reasons to  take this  Another medication with the same name was removed. Continue taking this medication, and follow the directions you see here.   pantoprazole 40 MG tablet Commonly known as: PROTONIX Take 1 tablet (40 mg total) by mouth 2 (two) times daily before a meal.   polyethylene glycol powder 17 GM/SCOOP powder Commonly known as: MiraLax Take 17 g by mouth 2 (two) times daily as needed for moderate constipation.   ProAir HFA 108 (90 Base) MCG/ACT inhaler Generic drug: albuterol Inhale 1-2 puffs into the lungs as needed for wheezing or shortness of breath.   senna-docusate 8.6-50 MG tablet Commonly known as: Senokot-S Take 1 tablet by mouth 2 (two) times daily as needed for moderate constipation.   valACYclovir 500 MG tablet Commonly known as:  VALTREX Take 500 mg by mouth daily.       Consultations:  Gastroenterology  Procedures/Studies:   CT ABDOMEN PELVIS W CONTRAST  Result Date: 08/25/2020 CLINICAL DATA:  44 year old with acute pancreatitis. EXAM: CT ABDOMEN AND PELVIS WITH CONTRAST TECHNIQUE: Multidetector CT imaging of the abdomen and pelvis was performed using the standard protocol following bolus administration of intravenous contrast. CONTRAST:  OMNIPAQUE IOHEXOL 300 MG/ML  SOLN COMPARISON:  08/15/2020 FINDINGS: Lower chest: Linear densities in the lower lobes are suggestive for atelectasis. Possible scarring in the left lower lobe. No large pleural effusions. Hepatobiliary: Cholecystectomy. Normal appearance of the liver. No focal lesion. Portal venous system is patent. No significant biliary dilatation. Pancreas: Stable low-density structure in the distal pancreatic body region that measures roughly 1.1 cm. Pancreatic duct is dilated distal to the low-density structure. Again noted are inflammatory changes around the splenic hilum and pancreatic tail region. Small pseudocyst collection in the left upper quadrant has decreased in size on sequence 3, image 30 measuring up to and 0.9 cm and previously measured 1.7 cm. Overall, there is decreased pseudocyst or fluid collections in the left upper quadrant just inferior to the spleen. Questionable low-density area in the pancreatic body region on sequence 3, image 24. Spleen: Normal in size without focal abnormality. Probable trace fluid around the spleen. Adrenals/Urinary Tract: Normal appearance of the adrenal glands. Normal appearance of both kidneys. No hydronephrosis. Normal appearance of the urinary bladder. Stomach/Bowel: There continues to be marked wall thickening along the posterior aspect of the stomach on sequence 6 image 22 that measures up to 2.0 cm. Again noted are inflammatory changes around the splenic flexure but no bowel wall thickening in this area. No evidence  for bowel obstruction. Vascular/Lymphatic: Portal venous system is patent including the splenic vein. Again noted is some narrowing of the left renal vein related to the SMA. Main visceral arteries are patent. Normal caliber of the abdominal aorta. No significant lymph node enlargement in the abdomen or pelvis. Reproductive: Status post hysterectomy. No adnexal masses. Other: Again noted is a small amount of free fluid in the pelvis and not significantly changed. Negative for free air. Musculoskeletal: No acute bone abnormality. IMPRESSION: 1. Small fluid collections and inflammatory changes in the left upper quadrant of the abdomen have decreased since 08/15/2020. Stable pseudocyst/cystic structure in the distal pancreatic body region. 2. Persistent wall thickening along the posterior aspect of the stomach. No evidence for bowel obstruction. 3. Small amount of pelvic free fluid is unchanged. Electronically Signed   By: Richarda Overlie M.D.   On: 08/25/2020 12:26   CT ABDOMEN PELVIS W CONTRAST  Result Date: 08/15/2020 CLINICAL DATA:  Epigastric and LEFT upper quadrant pain, nausea, vomiting, and  diarrhea, history of pancreatitis with pseudocysts, similar pain, recent admission for pancreatitis EXAM: CT ABDOMEN AND PELVIS WITH CONTRAST TECHNIQUE: Multidetector CT imaging of the abdomen and pelvis was performed using the standard protocol following bolus administration of intravenous contrast. Sagittal and coronal MPR images reconstructed from axial data set. CONTRAST:  OMNIPAQUE IOHEXOL 300 MG/ML SOLN IV. No oral contrast. COMPARISON:  07/26/2020 FINDINGS: Lower chest: Lung bases clear Hepatobiliary: Gallbladder surgically absent. Liver normal appearance. No biliary dilatation. Pancreas: Edema along pancreatic tail consistent with pancreatitis. Low-attenuation focus identified at pancreatic tail 13 x 11 x 12 mm, unchanged. Progressive phlegmon identified in the LEFT upper quadrant since the prior exam, inferior  to spleen, 5.3 x 6.9 x 7.1 cm consistent with pancreatitis. This demonstrates irregular areas of enhancement, infiltration, and septations with a few more loculated fluid collections identified up to 16 mm greatest diameter. Interval removal of transgastric pseudocyst drain. Remainder of pancreas normal appearance without evidence of necrosis, calcification or ductal dilatation. No additional masses. Spleen: Normal appearance Adrenals/Urinary Tract: Adrenal glands, kidneys, ureters, and bladder normal appearance Stomach/Bowel: Normal appendix in RIGHT pelvis. Gastric wall thickening may be related to adjacent pancreatitis and prior transgastric procedure gastritis not excluded. Mild bowel wall thickening of the colon at the splenic flexure. Remaining bowel loops unremarkable. Vascular/Lymphatic: Vascular structures patent, including portal vein, superior mesenteric vein, and splenic vein. No adenopathy. Reproductive: Uterus surgically absent. Nonvisualization of ovaries. Other: Free fluid in pelvis.  No free air.  No hernia. Musculoskeletal: Unremarkable IMPRESSION: Acute pancreatitis with interval removal of transgastric pseudocyst drain. Progressive phlegmon in the LEFT upper quadrant inferior to the spleen, 5.3 x 6.9 x 7.1 cm, with a few loculated fluid collections identified up to 16 mm greatest diameter likely representing developing pseudo cysts. Stable low-attenuation focus at pancreatic tail 13 x 11 x 12 mm, could represent a pseudocyst, unchanged, recommend attention on follow-up imaging to exclude cystic pancreatic neoplasm. Gastric wall thickening may be related to adjacent pancreatitis and prior transgastric procedure, gastritis not excluded. Mild bowel wall thickening of the colon at the splenic flexure, likely reactive due to adjacent pancreatitis and phlegmon. Free fluid in pelvis. Electronically Signed   By: Ulyses Southward M.D.   On: 08/15/2020 16:57   [place] EKG SITE RITE  Result Date: 08/22/2020 If  Site Rite image not attached, placement could not be confirmed due to current cardiac rhythm.      The results of significant diagnostics from this hospitalization (including imaging, microbiology, ancillary and laboratory) are listed below for reference.     Microbiology: No results found for this or any previous visit (from the past 240 hour(s)).   Labs: BNP (last 3 results) No results for input(s): BNP in the last 8760 hours. Basic Metabolic Panel: Recent Labs  Lab 08/24/20 0321 08/25/20 0401 08/26/20 0428 08/27/20 0325 08/28/20 0350  NA 140 139 139 140 139  K 4.4 4.5 3.9 4.2 4.1  CL 101 103 104 110 110  CO2 30 28 25 22  21*  GLUCOSE 100* 105* 108* 118* 110*  BUN <5* 7 12 15 19   CREATININE 0.66 0.56 0.61 0.52 0.53  CALCIUM 8.9 9.0 9.4 9.2 9.3  MG 1.8 1.9 1.8 2.0 2.0  PHOS 3.3 2.9 4.0 3.9 3.3   Liver Function Tests: Recent Labs  Lab 08/23/20 0507 08/24/20 0321 08/26/20 0428  AST 20 35 14*  ALT 21 30 18   ALKPHOS 174* 172* 161*  BILITOT 0.4 0.3 0.4  PROT 5.5* 6.0* 6.2*  ALBUMIN 2.7*  2.8* 2.8*   No results for input(s): LIPASE, AMYLASE in the last 168 hours. No results for input(s): AMMONIA in the last 168 hours. CBC: Recent Labs  Lab 08/22/20 0339 08/24/20 0321 08/26/20 0428  WBC 8.4 8.5 11.5*  NEUTROABS  --   --  8.4*  HGB 9.4* 10.4* 11.1*  HCT 29.5* 33.4* 35.7*  MCV 93.4 94.9 95.5  PLT 464* 512* 463*   Cardiac Enzymes: No results for input(s): CKTOTAL, CKMB, CKMBINDEX, TROPONINI in the last 168 hours. BNP: Invalid input(s): POCBNP CBG: Recent Labs  Lab 08/27/20 1625 08/27/20 2009 08/28/20 0055 08/28/20 0606 08/28/20 0834  GLUCAP 75 102* 137* 109* 112*   D-Dimer No results for input(s): DDIMER in the last 72 hours. Hgb A1c No results for input(s): HGBA1C in the last 72 hours. Lipid Profile Recent Labs    08/26/20 0421  TRIG 257*   Thyroid function studies No results for input(s): TSH, T4TOTAL, T3FREE, THYROIDAB in the last 72  hours.  Invalid input(s): FREET3 Anemia work up No results for input(s): VITAMINB12, FOLATE, FERRITIN, TIBC, IRON, RETICCTPCT in the last 72 hours. Urinalysis    Component Value Date/Time   COLORURINE STRAW (A) 08/15/2020 1609   APPEARANCEUR CLEAR 08/15/2020 1609   APPEARANCEUR CLEAR 03/23/2013 1621   LABSPEC 1.009 08/15/2020 1609   LABSPEC 1.021 03/23/2013 1621   PHURINE 6.0 08/15/2020 1609   GLUCOSEU NEGATIVE 08/15/2020 1609   GLUCOSEU see comment 03/23/2013 1621   HGBUR NEGATIVE 08/15/2020 1609   BILIRUBINUR NEGATIVE 08/15/2020 1609   BILIRUBINUR see comment 03/23/2013 1621   KETONESUR NEGATIVE 08/15/2020 1609   PROTEINUR NEGATIVE 08/15/2020 1609   UROBILINOGEN 1.0 09/29/2014 2116   NITRITE NEGATIVE 08/15/2020 1609   LEUKOCYTESUR NEGATIVE 08/15/2020 1609   LEUKOCYTESUR see comment 03/23/2013 1621   Sepsis Labs Invalid input(s): PROCALCITONIN,  WBC,  LACTICIDVEN   Time coordinating discharge: 40 minutes  SIGNED:  Almon Herculesaye T Haig Gerardo, MD  [facility] 08/28/2020, 2:00 PM  If 7PM-7AM, please contact night-coverage [email or web address]

## 2020-08-28 NOTE — TOC Progression Note (Signed)
Transition of Care [facility]) - Progression Note    Patient Details  Name: Tricia Brown MRN: [id number] Date of Birth: [birth year]  Transition of Care [facility]) CM/SW Contact  Nadene Rubins Adria Devon, RN Phone Number: 08/28/2020, 9:53 AM  Clinical Narrative:     Plan for discharge today. NCM spoke with [name] with [facility]. [name] is aware of discharge today and patient is scheduled to have a HHRN visit this PM with [facility]. Patient aware.  Expected Discharge Plan: Home w Home Health Services Barriers to Discharge: Continued Medical Work up  Expected Discharge Plan and Services Expected Discharge Plan: Home w Home Health Services   Discharge Planning Services: CM Consult Post Acute Care Choice: Home Health Living arrangements for the past 2 months: Single Family Home Expected Discharge Date: 08/28/20               DME Arranged: N/A DME Agency: NA       HH Arranged: RN           Social Determinants of Health (SDOH) Interventions    Readmission Risk Interventions No flowsheet data found.

## 2020-08-28 NOTE — Plan of Care (Signed)
  Problem: Health Behavior/Discharge Planning: Goal: Ability to manage health-related needs will improve Outcome: Adequate for Discharge   Problem: Clinical Measurements: Goal: Will remain free from infection Outcome: Adequate for Discharge   Problem: Elimination: Goal: Will not experience complications related to bowel motility Outcome: Adequate for Discharge Goal: Will not experience complications related to urinary retention Outcome: Adequate for Discharge   Problem: Pain Managment: Goal: General experience of comfort will improve Outcome: Adequate for Discharge   Problem: Safety: Goal: Ability to remain free from injury will improve Outcome: Adequate for Discharge   Problem: Education: Goal: Knowledge of Pancreatitis treatment and prevention will improve Outcome: Adequate for Discharge   Problem: Health Behavior/Discharge Planning: Goal: Ability to formulate a plan to maintain an alcohol-free life will improve Outcome: Adequate for Discharge   Problem: Nutritional: Goal: Ability to achieve adequate nutritional intake will improve Outcome: Adequate for Discharge   Problem: Clinical Measurements: Goal: Complications related to the disease process, condition or treatment will be avoided or minimized Outcome: Adequate for Discharge

## 2020-08-28 NOTE — Progress Notes (Signed)
Discharge instructions (including medications) will be  discussed with and a copy will be provided to patient.  

## 2020-08-28 NOTE — Progress Notes (Signed)
PHARMACY - TOTAL PARENTERAL NUTRITION CONSULT NOTE  Indication: Complicated pancreatitis with loculated pseudocysts  Patient Measurements: Height: 5\' 7"  (170.2 cm) Weight: 58.4 kg (128 lb 12 oz) IBW/kg (Calculated) : 61.6 TPN AdjBW (KG): 58.4 Body mass index is 20.16 kg/m. Usual Weight: 61-64 kg last admit, now 58.4 kg  Assessment:  43 YOF recently admitted 10/20-11/17 with necrotizing pancreatitis with infected pseudocyst s/p cystgastrostomy and then stent removal.  Patient returned on 08/15/20 with abdominal pain and progressive phlegmon in the LUQ.  Pharmacy consulted for TPN management as patient is refusing nasal jejunal feeds and failed full liquid diet per Dr. Michail Sermon.  Patient endorses a 20-lb weight loss over the past 6 months.  Prior to this admission, she was eating at 25% capacity compared to before the initial onset of pancreatitis.  Glucose / Insulin: no hx DM, A1c 4.6% - BG < 140, 3 units insulin used Electrolytes: Cl 110. Other electrolytes wnl.  Renal: SCr 0.5 stable, BUN 19 LFTs / TGs: LFT/Tbili wnl; alk phos 161; TG 257 (drawn while lipids running)  Prealbumin / albumin: albumin 2.8, prealbumin 19.4, lipase 95 Intake / Output; MIVF: incompletely documented GI Imaging: 12/5 CT Abdomen -Small fluid collections and inflammatory changes in the left upper quadrant of the abdomen have decreased since 08/15/2020. Stable pseudocyst/cystic structure in the distal pancreatic body region. Surgeries / Procedures: N/A  Central access: 12/3 TPN start date: 12/3  Nutritional Goals (per RD rec on 08/22/20): 2000-2200 kCal, 105-120gm protein per day, fluid > 2L  Current Nutrition:  TPN  CLD per pt ate 1/2 broth at lunch and 1 whole cup of broth at dinner  Plan:  Discharging today, TPN set up with Wynona Dove, PharmD, BCPS, [place] Pharmacist  Please check AMION for all [place] phone numbers After 10:00 PM, call [place] [phone]

## 2020-09-06 ENCOUNTER — Other Ambulatory Visit: Payer: Self-pay | Admitting: Physician Assistant

## 2020-09-06 ENCOUNTER — Ambulatory Visit
Admission: RE | Admit: 2020-09-06 | Discharge: 2020-09-06 | Disposition: A | Discharge status: Home / Self Care | Payer: Medicaid Other | Source: Ambulatory Visit | Attending: Physician Assistant | Admitting: Physician Assistant

## 2020-09-06 ENCOUNTER — Other Ambulatory Visit: Payer: Self-pay

## 2020-09-06 DIAGNOSIS — R110 Nausea: Secondary | ICD-10-CM

## 2020-09-06 DIAGNOSIS — Z8719 Personal history of other diseases of the digestive system: Secondary | ICD-10-CM

## 2020-09-06 DIAGNOSIS — R1084 Generalized abdominal pain: Secondary | ICD-10-CM

## 2020-09-06 MED ORDER — IOPAMIDOL (ISOVUE-300) INJECTION 61%
100.0000 mL | Freq: Once | INTRAVENOUS | Status: AC | PRN
  Administered 2020-09-06: 12:00:00 100 mL via INTRAVENOUS

## 2020-09-25 ENCOUNTER — Encounter (HOSPITAL_COMMUNITY): Payer: Self-pay

## 2020-09-25 ENCOUNTER — Other Ambulatory Visit: Payer: Self-pay | Admitting: Physician Assistant

## 2020-09-25 ENCOUNTER — Other Ambulatory Visit: Payer: Self-pay

## 2020-09-25 ENCOUNTER — Inpatient Hospital Stay (HOSPITAL_COMMUNITY)
Admission: EM | Admit: 2020-09-25 | Discharge: 2020-10-09 | DRG: 439 | Disposition: A | Discharge status: Home / Self Care | Payer: Medicaid Other | Attending: Internal Medicine | Admitting: Internal Medicine

## 2020-09-25 DIAGNOSIS — K08109 Complete loss of teeth, unspecified cause, unspecified class: Secondary | ICD-10-CM | POA: Diagnosis present

## 2020-09-25 DIAGNOSIS — Z87891 Personal history of nicotine dependence: Secondary | ICD-10-CM

## 2020-09-25 DIAGNOSIS — F1120 Opioid dependence, uncomplicated: Secondary | ICD-10-CM | POA: Diagnosis present

## 2020-09-25 DIAGNOSIS — Z79899 Other long term (current) drug therapy: Secondary | ICD-10-CM

## 2020-09-25 DIAGNOSIS — E876 Hypokalemia: Secondary | ICD-10-CM | POA: Diagnosis not present

## 2020-09-25 DIAGNOSIS — K529 Noninfective gastroenteritis and colitis, unspecified: Secondary | ICD-10-CM | POA: Diagnosis present

## 2020-09-25 DIAGNOSIS — G8929 Other chronic pain: Secondary | ICD-10-CM | POA: Diagnosis present

## 2020-09-25 DIAGNOSIS — K863 Pseudocyst of pancreas: Secondary | ICD-10-CM | POA: Diagnosis present

## 2020-09-25 DIAGNOSIS — Z79890 Hormone replacement therapy (postmenopausal): Secondary | ICD-10-CM

## 2020-09-25 DIAGNOSIS — F32A Depression, unspecified: Secondary | ICD-10-CM | POA: Diagnosis present

## 2020-09-25 DIAGNOSIS — Z7951 Long term (current) use of inhaled steroids: Secondary | ICD-10-CM

## 2020-09-25 DIAGNOSIS — Z20822 Contact with and (suspected) exposure to covid-19: Secondary | ICD-10-CM | POA: Diagnosis present

## 2020-09-25 DIAGNOSIS — K3189 Other diseases of stomach and duodenum: Secondary | ICD-10-CM | POA: Diagnosis present

## 2020-09-25 DIAGNOSIS — Z560 Unemployment, unspecified: Secondary | ICD-10-CM

## 2020-09-25 DIAGNOSIS — K219 Gastro-esophageal reflux disease without esophagitis: Secondary | ICD-10-CM | POA: Diagnosis present

## 2020-09-25 DIAGNOSIS — K5900 Constipation, unspecified: Secondary | ICD-10-CM | POA: Diagnosis not present

## 2020-09-25 DIAGNOSIS — K8500 Idiopathic acute pancreatitis without necrosis or infection: Secondary | ICD-10-CM

## 2020-09-25 DIAGNOSIS — Z9049 Acquired absence of other specified parts of digestive tract: Secondary | ICD-10-CM

## 2020-09-25 DIAGNOSIS — K2970 Gastritis, unspecified, without bleeding: Secondary | ICD-10-CM | POA: Diagnosis present

## 2020-09-25 DIAGNOSIS — J449 Chronic obstructive pulmonary disease, unspecified: Secondary | ICD-10-CM | POA: Diagnosis present

## 2020-09-25 DIAGNOSIS — Z8614 Personal history of Methicillin resistant Staphylococcus aureus infection: Secondary | ICD-10-CM

## 2020-09-25 DIAGNOSIS — Z87410 Personal history of cervical dysplasia: Secondary | ICD-10-CM

## 2020-09-25 DIAGNOSIS — M549 Dorsalgia, unspecified: Secondary | ICD-10-CM | POA: Diagnosis present

## 2020-09-25 DIAGNOSIS — K859 Acute pancreatitis without necrosis or infection, unspecified: Principal | ICD-10-CM | POA: Diagnosis present

## 2020-09-25 DIAGNOSIS — Z90710 Acquired absence of both cervix and uterus: Secondary | ICD-10-CM

## 2020-09-25 LAB — CBC
HCT: 38.7 % (ref 36.0–46.0)
Hemoglobin: 12.6 g/dL (ref 12.0–15.0)
MCH: 29.8 pg (ref 26.0–34.0)
MCHC: 32.6 g/dL (ref 30.0–36.0)
MCV: 91.5 fL (ref 80.0–100.0)
Platelets: 298 10*3/uL (ref 150–400)
RBC: 4.23 MIL/uL (ref 3.87–5.11)
RDW: 14.7 % (ref 11.5–15.5)
WBC: 10.5 10*3/uL (ref 4.0–10.5)
nRBC: 0 % (ref 0.0–0.2)

## 2020-09-25 LAB — COMPREHENSIVE METABOLIC PANEL
ALT: 38 U/L (ref 0–44)
AST: 20 U/L (ref 15–41)
Albumin: 3.8 g/dL (ref 3.5–5.0)
Alkaline Phosphatase: 111 U/L (ref 38–126)
Anion gap: 12 (ref 5–15)
BUN: 19 mg/dL (ref 6–20)
CO2: 23 mmol/L (ref 22–32)
Calcium: 9.5 mg/dL (ref 8.9–10.3)
Chloride: 106 mmol/L (ref 98–111)
Creatinine, Ser: 0.7 mg/dL (ref 0.44–1.00)
GFR, Estimated: >60 mL/min (ref >60)
Glucose, Bld: 87 mg/dL (ref 70–99)
Potassium: 2.6 mmol/L — CL (ref 3.5–5.1)
Sodium: 141 mmol/L (ref 135–145)
Total Bilirubin: 0.6 mg/dL (ref 0.3–1.2)
Total Protein: 6.9 g/dL (ref 6.5–8.1)

## 2020-09-25 LAB — LIPASE, BLOOD: Lipase: 138 U/L — ABNORMAL HIGH (ref 11–51)

## 2020-09-25 MED ORDER — OXYCODONE-ACETAMINOPHEN 5-325 MG PO TABS
1.0000 | ORAL_TABLET | Freq: Once | ORAL | Status: AC
  Administered 2020-09-25: 1 via ORAL
  Filled 2020-09-25: qty 1

## 2020-09-25 NOTE — ED Triage Notes (Signed)
Pt reports chronic abd pain due to pancreatitis but the psat 2 days has been worse. Pt recently admitted in decemeber, has PICC line in place, received TPN, reports fevers at home, took ibuprofen. Afebrile in triage.

## 2020-09-26 ENCOUNTER — Encounter (HOSPITAL_COMMUNITY): Payer: Self-pay | Admitting: Internal Medicine

## 2020-09-26 ENCOUNTER — Emergency Department (HOSPITAL_COMMUNITY): Payer: Medicaid Other

## 2020-09-26 ENCOUNTER — Other Ambulatory Visit: Payer: Self-pay

## 2020-09-26 DIAGNOSIS — K859 Acute pancreatitis without necrosis or infection, unspecified: Secondary | ICD-10-CM | POA: Diagnosis present

## 2020-09-26 DIAGNOSIS — K8591 Acute pancreatitis with uninfected necrosis, unspecified: Secondary | ICD-10-CM | POA: Diagnosis not present

## 2020-09-26 DIAGNOSIS — G8929 Other chronic pain: Secondary | ICD-10-CM | POA: Diagnosis present

## 2020-09-26 DIAGNOSIS — K529 Noninfective gastroenteritis and colitis, unspecified: Secondary | ICD-10-CM | POA: Diagnosis present

## 2020-09-26 DIAGNOSIS — K5900 Constipation, unspecified: Secondary | ICD-10-CM | POA: Diagnosis not present

## 2020-09-26 DIAGNOSIS — R590 Localized enlarged lymph nodes: Secondary | ICD-10-CM | POA: Diagnosis not present

## 2020-09-26 DIAGNOSIS — K2970 Gastritis, unspecified, without bleeding: Secondary | ICD-10-CM | POA: Diagnosis present

## 2020-09-26 DIAGNOSIS — J449 Chronic obstructive pulmonary disease, unspecified: Secondary | ICD-10-CM | POA: Diagnosis present

## 2020-09-26 DIAGNOSIS — K8500 Idiopathic acute pancreatitis without necrosis or infection: Secondary | ICD-10-CM | POA: Diagnosis not present

## 2020-09-26 DIAGNOSIS — F32A Depression, unspecified: Secondary | ICD-10-CM | POA: Diagnosis present

## 2020-09-26 DIAGNOSIS — K2289 Other specified disease of esophagus: Secondary | ICD-10-CM | POA: Diagnosis not present

## 2020-09-26 DIAGNOSIS — Z90710 Acquired absence of both cervix and uterus: Secondary | ICD-10-CM | POA: Diagnosis not present

## 2020-09-26 DIAGNOSIS — Z87891 Personal history of nicotine dependence: Secondary | ICD-10-CM | POA: Diagnosis not present

## 2020-09-26 DIAGNOSIS — K862 Cyst of pancreas: Secondary | ICD-10-CM | POA: Diagnosis not present

## 2020-09-26 DIAGNOSIS — K219 Gastro-esophageal reflux disease without esophagitis: Secondary | ICD-10-CM | POA: Diagnosis present

## 2020-09-26 DIAGNOSIS — K863 Pseudocyst of pancreas: Secondary | ICD-10-CM | POA: Diagnosis present

## 2020-09-26 DIAGNOSIS — K08109 Complete loss of teeth, unspecified cause, unspecified class: Secondary | ICD-10-CM | POA: Diagnosis present

## 2020-09-26 DIAGNOSIS — F1120 Opioid dependence, uncomplicated: Secondary | ICD-10-CM

## 2020-09-26 DIAGNOSIS — Z8614 Personal history of Methicillin resistant Staphylococcus aureus infection: Secondary | ICD-10-CM | POA: Diagnosis not present

## 2020-09-26 DIAGNOSIS — M549 Dorsalgia, unspecified: Secondary | ICD-10-CM | POA: Diagnosis present

## 2020-09-26 DIAGNOSIS — E876 Hypokalemia: Secondary | ICD-10-CM | POA: Diagnosis not present

## 2020-09-26 DIAGNOSIS — Z87410 Personal history of cervical dysplasia: Secondary | ICD-10-CM | POA: Diagnosis not present

## 2020-09-26 DIAGNOSIS — Z7951 Long term (current) use of inhaled steroids: Secondary | ICD-10-CM | POA: Diagnosis not present

## 2020-09-26 DIAGNOSIS — Z79890 Hormone replacement therapy (postmenopausal): Secondary | ICD-10-CM | POA: Diagnosis not present

## 2020-09-26 DIAGNOSIS — K3189 Other diseases of stomach and duodenum: Secondary | ICD-10-CM | POA: Diagnosis present

## 2020-09-26 DIAGNOSIS — Z560 Unemployment, unspecified: Secondary | ICD-10-CM | POA: Diagnosis not present

## 2020-09-26 DIAGNOSIS — Z79899 Other long term (current) drug therapy: Secondary | ICD-10-CM | POA: Diagnosis not present

## 2020-09-26 DIAGNOSIS — Z20822 Contact with and (suspected) exposure to covid-19: Secondary | ICD-10-CM | POA: Diagnosis present

## 2020-09-26 LAB — URINALYSIS, ROUTINE W REFLEX MICROSCOPIC
Bilirubin Urine: NEGATIVE
Glucose, UA: NEGATIVE mg/dL
Hgb urine dipstick: NEGATIVE
Ketones, ur: NEGATIVE mg/dL
Leukocytes,Ua: NEGATIVE
Nitrite: NEGATIVE
Protein, ur: 30 mg/dL — AB
Specific Gravity, Urine: 1.024 (ref 1.005–1.030)
pH: 6 (ref 5.0–8.0)

## 2020-09-26 LAB — RESP PANEL BY RT-PCR (FLU A&B, COVID) ARPGX2
Influenza A by PCR: NEGATIVE
Influenza B by PCR: NEGATIVE
SARS Coronavirus 2 by RT PCR: NEGATIVE

## 2020-09-26 LAB — MAGNESIUM: Magnesium: 1.6 mg/dL — ABNORMAL LOW (ref 1.7–2.4)

## 2020-09-26 MED ORDER — SUCRALFATE 1 G PO TABS
1.0000 g | ORAL_TABLET | Freq: Four times a day (QID) | ORAL | Status: DC
  Administered 2020-09-26 – 2020-10-05 (×32): 1 g via ORAL
  Filled 2020-09-26 (×31): qty 1

## 2020-09-26 MED ORDER — ACETAMINOPHEN 650 MG RE SUPP: 650.0000 mg | Freq: Four times a day (QID) | RECTAL | Status: DC | PRN

## 2020-09-26 MED ORDER — HYDROMORPHONE HCL 1 MG/ML IJ SOLN
1.0000 mg | Freq: Once | INTRAMUSCULAR | Status: AC
  Administered 2020-09-26: 1 mg via INTRAVENOUS
  Filled 2020-09-26: qty 1

## 2020-09-26 MED ORDER — ONDANSETRON HCL 4 MG PO TABS
4.0000 mg | ORAL_TABLET | Freq: Four times a day (QID) | ORAL | Status: DC | PRN
  Administered 2020-09-27 – 2020-10-09 (×9): 4 mg via ORAL
  Filled 2020-09-26 (×11): qty 1

## 2020-09-26 MED ORDER — ONDANSETRON HCL 4 MG/2ML IJ SOLN
4.0000 mg | Freq: Once | INTRAMUSCULAR | Status: AC
  Administered 2020-09-26: 4 mg via INTRAVENOUS
  Filled 2020-09-26: qty 2

## 2020-09-26 MED ORDER — OXYCODONE HCL 5 MG PO TABS
15.0000 mg | ORAL_TABLET | Freq: Four times a day (QID) | ORAL | Status: DC | PRN
  Administered 2020-09-26 – 2020-10-01 (×16): 15 mg via ORAL
  Filled 2020-09-26 (×16): qty 3

## 2020-09-26 MED ORDER — LACTATED RINGERS IV SOLN: INTRAVENOUS | Status: DC

## 2020-09-26 MED ORDER — DICYCLOMINE HCL 10 MG/ML IM SOLN
20.0000 mg | Freq: Once | INTRAMUSCULAR | Status: AC
  Administered 2020-09-26: 20 mg via INTRAMUSCULAR
  Filled 2020-09-26: qty 2

## 2020-09-26 MED ORDER — ENOXAPARIN SODIUM 40 MG/0.4ML ~~LOC~~ SOLN
40.0000 mg | SUBCUTANEOUS | Status: DC
  Administered 2020-09-26 – 2020-10-02 (×7): 40 mg via SUBCUTANEOUS
  Filled 2020-09-26 (×7): qty 0.4

## 2020-09-26 MED ORDER — IOHEXOL 300 MG/ML  SOLN
75.0000 mL | Freq: Once | INTRAMUSCULAR | Status: AC | PRN
  Administered 2020-09-26: 75 mL via INTRAVENOUS

## 2020-09-26 MED ORDER — DICYCLOMINE HCL 10 MG PO CAPS
10.0000 mg | ORAL_CAPSULE | Freq: Four times a day (QID) | ORAL | Status: DC | PRN
  Administered 2020-09-27 – 2020-10-09 (×30): 10 mg via ORAL
  Filled 2020-09-26 (×31): qty 1

## 2020-09-26 MED ORDER — POTASSIUM CHLORIDE 10 MEQ/100ML IV SOLN
10.0000 meq | INTRAVENOUS | Status: AC
  Administered 2020-09-26 (×2): 10 meq via INTRAVENOUS
  Filled 2020-09-26 (×2): qty 100

## 2020-09-26 MED ORDER — ONDANSETRON HCL 4 MG/2ML IJ SOLN
4.0000 mg | Freq: Four times a day (QID) | INTRAMUSCULAR | Status: DC | PRN
  Administered 2020-09-26 – 2020-10-09 (×37): 4 mg via INTRAVENOUS
  Filled 2020-09-26 (×38): qty 2

## 2020-09-26 MED ORDER — ACETAMINOPHEN 325 MG PO TABS
650.0000 mg | ORAL_TABLET | Freq: Four times a day (QID) | ORAL | Status: DC | PRN
  Administered 2020-10-08: 650 mg via ORAL
  Filled 2020-09-26: qty 2

## 2020-09-26 MED ORDER — PANTOPRAZOLE SODIUM 40 MG IV SOLR
40.0000 mg | Freq: Two times a day (BID) | INTRAVENOUS | Status: DC
  Administered 2020-09-26 – 2020-10-09 (×26): 40 mg via INTRAVENOUS
  Filled 2020-09-26 (×26): qty 40

## 2020-09-26 MED ORDER — MORPHINE SULFATE (PF) 4 MG/ML IV SOLN
4.0000 mg | INTRAVENOUS | Status: DC | PRN
  Administered 2020-09-26 – 2020-09-27 (×5): 4 mg via INTRAVENOUS
  Filled 2020-09-26 (×5): qty 1

## 2020-09-26 MED ORDER — SODIUM CHLORIDE 0.9 % IV SOLN: Freq: Once | INTRAVENOUS | Status: AC

## 2020-09-26 MED ORDER — FAMOTIDINE IN NACL 20-0.9 MG/50ML-% IV SOLN
20.0000 mg | Freq: Once | INTRAVENOUS | Status: AC
  Administered 2020-09-26: 20 mg via INTRAVENOUS
  Filled 2020-09-26: qty 50

## 2020-09-26 MED ORDER — MONTELUKAST SODIUM 10 MG PO TABS
10.0000 mg | ORAL_TABLET | Freq: Every day | ORAL | Status: DC
  Administered 2020-09-26 – 2020-10-09 (×13): 10 mg via ORAL
  Filled 2020-09-26 (×13): qty 1

## 2020-09-26 MED ORDER — ACETAMINOPHEN 325 MG PO TABS
650.0000 mg | ORAL_TABLET | Freq: Once | ORAL | Status: AC
  Administered 2020-09-26: 650 mg via ORAL
  Filled 2020-09-26: qty 2

## 2020-09-26 MED ORDER — COLESTIPOL HCL 1 G PO TABS
1.0000 g | ORAL_TABLET | Freq: Two times a day (BID) | ORAL | Status: DC
  Administered 2020-09-26 – 2020-10-05 (×17): 1 g via ORAL
  Filled 2020-09-26 (×23): qty 1

## 2020-09-26 MED ORDER — HYDRALAZINE HCL 20 MG/ML IJ SOLN: 5.0000 mg | INTRAMUSCULAR | Status: DC | PRN

## 2020-09-26 MED ORDER — CHLORHEXIDINE GLUCONATE CLOTH 2 % EX PADS
6.0000 | MEDICATED_PAD | Freq: Every day | CUTANEOUS | Status: DC
  Administered 2020-09-28 – 2020-10-08 (×9): 6 via TOPICAL

## 2020-09-26 NOTE — ED Notes (Signed)
  Pt transported to ct 
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
## 2020-09-26 NOTE — ED Notes (Signed)
Patient transported upstairs with all belongings  VSS
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
## 2020-09-26 NOTE — Progress Notes (Signed)
Patient known to our service and her labs and CT were reviewed and her case discussed with our PA and I do not think the cyst is ready for a cyst gastrostomy however she probably has disruption of her pancreatic duct which causes recurrent t fluid collection and probably needs a pancreatogram and probable PD stent and we had referred her to [facility] but I do not believe she has had that appointment yet and will discuss that with Dr. Meridee Score tomorrow to see if he agrees and is willing to proceed at an appropriate time

## 2020-09-26 NOTE — Progress Notes (Signed)
CT findings reviewed and discussed with Dr Ewing Schlein and Dr Levora Angel. Patient well-known to our service.   Recommend initiation of IVF at 150 cc/h.   Clear liquid diet OK from GI standpoint.   We will plan to discuss case with Dr. Meridee Score to see if patient is a candidate for cyst gastrostomy.   Full consult note to follow tomorrow.   Edrick Kins, PA-C

## 2020-09-27 DIAGNOSIS — K8500 Idiopathic acute pancreatitis without necrosis or infection: Secondary | ICD-10-CM

## 2020-09-27 LAB — CBC
HCT: 31.5 % — ABNORMAL LOW (ref 36.0–46.0)
Hemoglobin: 10.6 g/dL — ABNORMAL LOW (ref 12.0–15.0)
MCH: 31 pg (ref 26.0–34.0)
MCHC: 33.7 g/dL (ref 30.0–36.0)
MCV: 92.1 fL (ref 80.0–100.0)
Platelets: 212 10*3/uL (ref 150–400)
RBC: 3.42 MIL/uL — ABNORMAL LOW (ref 3.87–5.11)
RDW: 14.4 % (ref 11.5–15.5)
WBC: 5.7 10*3/uL (ref 4.0–10.5)
nRBC: 0 % (ref 0.0–0.2)

## 2020-09-27 LAB — COMPREHENSIVE METABOLIC PANEL
ALT: 26 U/L (ref 0–44)
AST: 19 U/L (ref 15–41)
Albumin: 2.9 g/dL — ABNORMAL LOW (ref 3.5–5.0)
Alkaline Phosphatase: 102 U/L (ref 38–126)
Anion gap: 9 (ref 5–15)
BUN: 18 mg/dL (ref 6–20)
CO2: 22 mmol/L (ref 22–32)
Calcium: 8.9 mg/dL (ref 8.9–10.3)
Chloride: 109 mmol/L (ref 98–111)
Creatinine, Ser: 0.8 mg/dL (ref 0.44–1.00)
GFR, Estimated: >60 mL/min (ref >60)
Glucose, Bld: 66 mg/dL — ABNORMAL LOW (ref 70–99)
Potassium: 3.2 mmol/L — ABNORMAL LOW (ref 3.5–5.1)
Sodium: 140 mmol/L (ref 135–145)
Total Bilirubin: 0.9 mg/dL (ref 0.3–1.2)
Total Protein: 5.5 g/dL — ABNORMAL LOW (ref 6.5–8.1)

## 2020-09-27 LAB — GLUCOSE, CAPILLARY: Glucose-Capillary: 171 mg/dL — ABNORMAL HIGH (ref 70–99)

## 2020-09-27 LAB — PHOSPHORUS: Phosphorus: 5.3 mg/dL — ABNORMAL HIGH (ref 2.5–4.6)

## 2020-09-27 LAB — MAGNESIUM: Magnesium: 1.4 mg/dL — ABNORMAL LOW (ref 1.7–2.4)

## 2020-09-27 MED ORDER — SODIUM CHLORIDE 0.9% FLUSH
10.0000 mL | INTRAVENOUS | Status: DC | PRN
  Administered 2020-10-01: 10 mL

## 2020-09-27 MED ORDER — PANCRELIPASE (LIP-PROT-AMYL) 36000-114000 UNITS PO CPEP
72000.0000 [IU] | ORAL_CAPSULE | Freq: Three times a day (TID) | ORAL | Status: DC
  Administered 2020-09-27 – 2020-10-09 (×32): 72000 [IU] via ORAL
  Filled 2020-09-27 (×32): qty 2

## 2020-09-27 MED ORDER — POTASSIUM CHLORIDE 10 MEQ/50ML IV SOLN
10.0000 meq | INTRAVENOUS | Status: AC
  Administered 2020-09-27 (×3): 10 meq via INTRAVENOUS
  Filled 2020-09-27 (×3): qty 50

## 2020-09-27 MED ORDER — TRACE MINERALS CU-MN-SE-ZN 300-55-60-3000 MCG/ML IV SOLN
INTRAVENOUS | Status: AC
  Filled 2020-09-27: qty 704

## 2020-09-27 MED ORDER — HYDROMORPHONE HCL 1 MG/ML IJ SOLN
0.5000 mg | INTRAMUSCULAR | Status: DC | PRN
  Administered 2020-09-27 – 2020-10-01 (×20): 0.5 mg via INTRAVENOUS
  Filled 2020-09-27 (×21): qty 1

## 2020-09-27 MED ORDER — INSULIN ASPART 100 UNIT/ML ~~LOC~~ SOLN
0.0000 [IU] | Freq: Three times a day (TID) | SUBCUTANEOUS | Status: DC
  Administered 2020-09-27: 2 [IU] via SUBCUTANEOUS

## 2020-09-27 MED ORDER — MAGNESIUM SULFATE 50 % IJ SOLN
3.0000 g | Freq: Once | INTRAVENOUS | Status: AC
  Administered 2020-09-27: 3 g via INTRAVENOUS
  Filled 2020-09-27: qty 6

## 2020-09-27 MED ORDER — SODIUM CHLORIDE 0.9% FLUSH
10.0000 mL | Freq: Two times a day (BID) | INTRAVENOUS | Status: DC
  Administered 2020-09-27 – 2020-10-09 (×9): 10 mL

## 2020-09-27 MED ORDER — BOOST / RESOURCE BREEZE PO LIQD CUSTOM
1.0000 | Freq: Three times a day (TID) | ORAL | Status: DC
  Administered 2020-09-27 – 2020-10-09 (×27): 1 via ORAL

## 2020-09-27 NOTE — TOC Initial Note (Signed)
Transition of Care [facility]) - Initial/Assessment Note    Patient Details  Name: Tricia Brown MRN: [id number] Date of Birth: 15-Jul-1976  [facility]) CM/SW Contact:    Kingsley Plan, RN Phone Number: 09/27/2020, 12:26 PM  Clinical Narrative:                  Confirmed face sheet information.   Patient from home with her son and his girl friend.   Patient active with [facility] and [facility] ( arranged through [facility]) . [name] with [facility] aware of admission.  Expected Discharge Plan: Home w Home Health Services     Patient Goals and CMS Choice Patient states their goals for this hospitalization and ongoing recovery are:: to return to home CMS Medicare.gov Compare Post Acute Care list provided to:: Patient Choice offered to / list presented to : Patient  Expected Discharge Plan and Services Expected Discharge Plan: Home w Home Health Services   Discharge Planning Services: CM Consult Post Acute Care Choice: Home Health Living arrangements for the past 2 months: Single Family Home                   DME Agency: NA       HH Arranged: RN          Prior Living Arrangements/Services Living arrangements for the past 2 months: Single Family Home Lives with:: Adult Children Patient language and need for interpreter reviewed:: Yes        Need for Family Participation in Patient Care: Yes (Comment) Care giver support system in place?: Yes (comment)   Criminal Activity/Legal Involvement Pertinent to Current Situation/Hospitalization: No - Comment as needed  Activities of Daily Living Home Assistive Devices/Equipment: None ADL Screening (condition at time of admission) Patient's cognitive ability adequate to safely complete daily activities?: Yes Is the patient deaf or have difficulty hearing?: No Does the patient have difficulty seeing, even when wearing glasses/contacts?: No Does the patient have difficulty  concentrating, remembering, or making decisions?: No Patient able to express need for assistance with ADLs?: Yes Does the patient have difficulty dressing or bathing?: No Independently performs ADLs?: Yes (appropriate for developmental age) Does the patient have difficulty walking or climbing stairs?: Yes Weakness of Legs: Both Weakness of Arms/Hands: None  Permission Sought/Granted   Permission granted to share information with : No              Emotional Assessment Appearance:: Appears stated age Attitude/Demeanor/Rapport: Engaged Affect (typically observed): Accepting Orientation: : Oriented to Situation,Oriented to  Time,Oriented to Place,Oriented to Self Alcohol / Substance Use: Not Applicable Psych Involvement: No (comment)  Admission diagnosis:  Acute pancreatitis [K85.90] Idiopathic acute pancreatitis without infection or necrosis [K85.00] Patient Active Problem List   Diagnosis Date Noted  . Acute pancreatitis 09/26/2020  . AKI (acute kidney injury) (HCC) 08/15/2020  . Transaminitis 08/15/2020  . Anemia 08/15/2020  . Abdominal pain 08/15/2020  . Pseudocyst of pancreas   . Gastritis and gastroduodenitis   . Pancreatitis 07/10/2020  . Hypokalemia 07/10/2020  . Opioid use disorder, severe, dependence (HCC) 02/08/2019  . MDD (major depressive disorder), recurrent episode, severe (HCC) 02/02/2019  . Pelvic pain in female 09/08/2011  . Endometriosis   . Dysplasia   . Depression   . IBS (irritable bowel syndrome)    PCP:  [name], MD Pharmacy:   [facility] [phone] - [place], [place] - [address] [place] [address] Phone: [phone] Fax: [phone]  [id number] - [place], [place] - [address] AT [address] [place] [address] Phone: [phone] Fax: [phone]     Social Determinants of Health (SDOH) Interventions     Readmission Risk Interventions No flowsheet data found.

## 2020-09-27 NOTE — Progress Notes (Signed)
PROGRESS NOTE    Tricia Brown  KDX:[id number] DOB: 12-27-1975 DOA: 09/25/2020 PCP: [name], MD   Brief Narrative:  HPI: Tricia Brown is a 45 y.o. female with medical history significant of chronic pain, on methadone; and depression presenting with worsening abdominal pain.  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  She was most recently treated with Meropenem and MS Contin with breakthrough Oxy IR.  She was discharged home with TPN.  Since d/c, she has been taking TPN IV x 18 hours a day.  She is also on Carafate QID, Protonix once daily, Zofran TID, Bentyl, and pancreatic enzymes BID.  She is also on binder BID to help with diarrhea.  She started with temp to 99 -> 100 over the last few days.  She was no longer able to keep ginger ale and broth down.  She was having trouble keeping her pills down too.  They are trying to get her referred to [facility].  The pain has been gradually worsening for about a week.  Her pain medications are not keeping the pain at bay.  She finished her antibiotic during her last hospitalization.    ED Course:  Waiting to hear back from GI. Complex history with recent necrotizing pancreatitis.  Back with recurrent symptoms - pain, n/v/d, only drinks broth and on TPN.  Febrile to 101 at home.  PICC line draining.  CT with acute non-necrotizing pancreatitis with peripancreatitis fluid collection.  Defer admission for now until GI calls back with plan.  Assessment & Plan:   Principal Problem:   Acute pancreatitis Active Problems:   Depression   Opioid use disorder, severe, dependence (HCC)   Acute/recurrent pancreatitis/chronic pain -Patient with severe necrotizing pancreatitis, with 2 prior prolonged hospitalizations including cystogastrostomy with stent placement and then removal -Now presenting with recurrent pain, n/v.  Seen by GI. They are considering for another stent. Plan per them is to repeat MRI in 1 to 2 days. They have started her on clears this morning. I am going to advance her to full liquid diet. She has been started on Creon as well. I have consulted pharmacy to initiate TPN. For his chronic pain, continue oxycodone and Dilaudid as needed  COPD: Not in exacerbation. -Continue Symbicort  DVT prophylaxis: enoxaparin (LOVENOX) injection 40 mg Start: 09/26/20 1500   Code Status: Full Code  Family Communication: None present at bedside.  Plan of care discussed with patient in length and he verbalized understanding and agreed with it.  Status is: Inpatient  Remains inpatient appropriate because:Inpatient level of care appropriate due to severity of illness   Dispo: The patient is from: Home              Anticipated d/c is to: Home              Anticipated d/c date is: 2 days              Patient currently is not medically stable to d/c.        Estimated body mass index is 21.14 kg/m as calculated from the following:   Height as of 08/15/20: 5\' 7"  (1.702 m).   Weight as of this encounter: 61.2 kg.      Nutritional status:  Nutrition Problem: Increased nutrient needs Etiology: chronic illness (pancreatitis)   Signs/Symptoms: estimated needs   Interventions: Boost Breeze,TPN    Consultants:   GI  Procedures:  None  Antimicrobials:  Anti-infectives (From admission, onward)   None         Subjective: Patient seen and examined. She stated that her pain is improved, 5 out of 10 compared to 10 out of 10 yesterday. Some nausea but no other complaint.  Objective: Vitals:   09/27/20 0148 09/27/20 0437 09/27/20 0900 09/27/20 0949  BP: 135/71 115/66  129/86  Pulse: 68 70  70  Resp: 16 17  17   Temp: 97.9 F (36.6 C) 97.7 F (36.5 C)  98.4 F (36.9 C)  TempSrc:    Oral  SpO2: 100% 99%  100%  Weight:   61.2 kg     Intake/Output Summary (Last 24 hours) at  09/27/2020 1412 Last data filed at 09/27/2020 11/25/2020 Gross per 24 hour  Intake 2922.76 ml  Output --  Net 2922.76 ml   Filed Weights   09/27/20 0900  Weight: 61.2 kg    Examination:  General exam: Appears calm and comfortable  Respiratory system: Clear to auscultation. Respiratory effort normal. Cardiovascular system: S1 & S2 heard, RRR. No JVD, murmurs, rubs, gallops or clicks. No pedal edema. Gastrointestinal system: Abdomen is nondistended, soft and generalized abdominal tenderness, more pronounced in the upper abdomen. No organomegaly or masses felt. Normal bowel sounds heard. Central nervous system: Alert and oriented. No focal neurological deficits. Extremities: Symmetric 5 x 5 power. Skin: No rashes, lesions or ulcers Psychiatry: Judgement and insight appear normal. Mood & affect appropriate.    Data Reviewed: I have personally reviewed following labs and imaging studies  CBC: Recent Labs  Lab 09/25/20 1757 09/27/20 0507  WBC 10.5 5.7  HGB 12.6 10.6*  HCT 38.7 31.5*  MCV 91.5 92.1  PLT 298 212   Basic Metabolic Panel: Recent Labs  Lab 09/25/20 1757 09/26/20 0803 09/27/20 0507  NA 141  --  140  K 2.6*  --  3.2*  CL 106  --  109  CO2 23  --  22  GLUCOSE 87  --  66*  BUN 19  --  18  CREATININE 0.70  --  0.80  CALCIUM 9.5  --  8.9  MG  --  1.6* 1.4*  PHOS  --   --  5.3*   GFR: Estimated Creatinine Clearance: 86.7 mL/min (by C-G formula based on SCr of 0.8 mg/dL). Liver Function Tests: Recent Labs  Lab 09/25/20 1757 09/27/20 0507  AST 20 19  ALT 38 26  ALKPHOS 111 102  BILITOT 0.6 0.9  PROT 6.9 5.5*  ALBUMIN 3.8 2.9*   Recent Labs  Lab 09/25/20 1757  LIPASE 138*   No results for input(s): AMMONIA in the last 168 hours. Coagulation Profile: No results for input(s): INR, PROTIME in the last 168 hours. Cardiac Enzymes: No results for input(s): CKTOTAL, CKMB, CKMBINDEX, TROPONINI in the last 168 hours. BNP (last 3 results) No results for  input(s): PROBNP in the last 8760 hours. HbA1C: No results for input(s): HGBA1C in the last 72 hours. CBG: No results for input(s): GLUCAP in the last 168 hours. Lipid Profile: No results for input(s): CHOL, HDL, LDLCALC, TRIG, CHOLHDL, LDLDIRECT in the last 72 hours. Thyroid Function Tests: No results for input(s): TSH, T4TOTAL, FREET4, T3FREE, THYROIDAB in the last 72 hours. Anemia Panel: No results for input(s): VITAMINB12, FOLATE, FERRITIN, TIBC, IRON, RETICCTPCT in the last 72 hours. Sepsis Labs: No results for input(s): PROCALCITON, LATICACIDVEN in the last 168 hours.  Recent Results (from the past 240 hour(s))  Resp Panel by RT-PCR (  Flu A&B, Covid) Nasopharyngeal Swab     Status: None   Collection Time: 09/26/20 12:52 PM   Specimen: Nasopharyngeal Swab; Nasopharyngeal(NP) swabs in vial transport medium  Result Value Ref Range Status   SARS Coronavirus 2 by RT PCR NEGATIVE NEGATIVE Final    Comment: (NOTE) SARS-CoV-2 target nucleic acids are NOT DETECTED.  The SARS-CoV-2 RNA is generally detectable in upper respiratory specimens during the acute phase of infection. The lowest concentration of SARS-CoV-2 viral copies this assay can detect is 138 copies/mL. A negative result does not preclude SARS-Cov-2 infection and should not be used as the sole basis for treatment or other patient management decisions. A negative result may occur with  improper specimen collection/handling, submission of specimen other than nasopharyngeal swab, presence of viral mutation(s) within the areas targeted by this assay, and inadequate number of viral copies(<138 copies/mL). A negative result must be combined with clinical observations, patient history, and epidemiological information. The expected result is Negative.  Fact Sheet for Patients:  EntrepreneurPulse.com.au  Fact Sheet for Healthcare Providers:  IncredibleEmployment.be  This test is no t yet  approved or cleared by the [place] FDA and  has been authorized for detection and/or diagnosis of SARS-CoV-2 by FDA under an Emergency Use Authorization (EUA). This EUA will remain  in effect (meaning this test can be used) for the duration of the COVID-19 declaration under Section 564(b)(1) of the Act, 21 U.S.C.section 360bbb-3(b)(1), unless the authorization is terminated  or revoked sooner.       Influenza A by PCR NEGATIVE NEGATIVE Final   Influenza B by PCR NEGATIVE NEGATIVE Final    Comment: (NOTE) The Xpert Xpress SARS-CoV-2/FLU/RSV plus assay is intended as an aid in the diagnosis of influenza from Nasopharyngeal swab specimens and should not be used as a sole basis for treatment. Nasal washings and aspirates are unacceptable for Xpert Xpress SARS-CoV-2/FLU/RSV testing.  Fact Sheet for Patients: EntrepreneurPulse.com.au  Fact Sheet for Healthcare Providers: IncredibleEmployment.be  This test is not yet approved or cleared by the [place] FDA and has been authorized for detection and/or diagnosis of SARS-CoV-2 by FDA under an Emergency Use Authorization (EUA). This EUA will remain in effect (meaning this test can be used) for the duration of the COVID-19 declaration under Section 564(b)(1) of the Act, 21 U.S.C. section 360bbb-3(b)(1), unless the authorization is terminated or revoked.  Performed at [facility], [place] [address], [place], [place] [address]       Radiology Studies: CT ABDOMEN PELVIS W CONTRAST  Result Date: 09/26/2020 CLINICAL DATA:  Acute on chronic pancreatitis with the gastric abdominal pain, nausea and vomiting, on TPN. Prior cholecystectomy and hysterectomy. EXAM: CT ABDOMEN AND PELVIS WITH CONTRAST TECHNIQUE: Multidetector CT imaging of the abdomen and pelvis was performed using the standard protocol following bolus administration of intravenous contrast. CONTRAST:  61mL OMNIPAQUE IOHEXOL 300 MG/ML   SOLN COMPARISON:  09/06/2020 CT abdomen/pelvis. FINDINGS: Lower chest: No significant pulmonary nodules or acute consolidative airspace disease. Hepatobiliary: Normal liver size. No liver mass. Cholecystectomy. No biliary ductal dilatation. CBD diameter 3 mm. Pancreas: Mild fullness of the distal pancreatic body and pancreatic tail with associated mild haziness of the peripancreatic fat, compatible with acute pancreatitis. Simple 4.5 x 1.8 cm peripancreatic fluid collection in the lesser sac (series 3/image 19), new. Preserved pancreatic parenchymal enhancement. No pancreatic mass or duct dilation. Spleen: Normal size. No mass. Adrenals/Urinary Tract: Normal adrenals. Normal kidneys with no hydronephrosis and no renal mass. Normal bladder. Stomach/Bowel: Normal non-distended  stomach. Normal caliber small bowel with no small bowel wall thickening. Normal appendix. Normal large bowel with no diverticulosis, large bowel wall thickening or pericolonic fat stranding. Vascular/Lymphatic: Normal caliber abdominal aorta. Patent portal, splenic, hepatic and renal veins. No pathologically enlarged lymph nodes in the abdomen or pelvis. Reproductive: Status post hysterectomy, with no abnormal findings at the vaginal cuff. No adnexal mass. Other: No pneumoperitoneum, ascites or focal fluid collection. Musculoskeletal: No aggressive appearing focal osseous lesions. Partially visualized healing anterior right seventh rib fracture. IMPRESSION: 1. Acute non-necrotizing pancreatitis. Simple 4.5 x 1.8 cm peripancreatic fluid collection in the lesser sac, new, compatible with an inflammatory fluid collection/developing pseudocyst. Suggest follow-up MRI abdomen without and with IV contrast in 3 months. 2. No biliary or pancreatic ductal dilatation. 3. Partially visualized healing anterior right seventh rib fracture. Electronically Signed   By: Delbert Phenix M.D.   On: 09/26/2020 10:00    Scheduled Meds: . Chlorhexidine Gluconate  Cloth  6 each Topical Daily  . colestipol  1 g Oral BID  . enoxaparin (LOVENOX) injection  40 mg Subcutaneous Q24H  . feeding supplement  1 Container Oral TID BM  . insulin aspart  0-9 Units Subcutaneous Q8H  . lipase/protease/amylase  72,000 Units Oral TID WC  . montelukast  10 mg Oral Daily  . pantoprazole (PROTONIX) IV  40 mg Intravenous Q12H  . sodium chloride flush  10-40 mL Intracatheter Q12H  . sucralfate  1 g Oral QID   Continuous Infusions: . lactated ringers 200 mL/hr at 09/27/20 1340  . potassium chloride 10 mEq (09/27/20 1340)  . TPN CYCLIC-ADULT (ION)       LOS: 1 day   Time spent: 35 minutes   Hughie Closs, MD [facility]  09/27/2020, 2:12 PM   To contact the attending provider between 7A-7P or the covering provider during after hours 7P-7A, please log into the web site [email or web address].
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
## 2020-09-27 NOTE — Progress Notes (Addendum)
PHARMACY - TOTAL PARENTERAL NUTRITION CONSULT NOTE  Indication: Complicated pancreatitis with loculated pseudocysts  Patient Measurements: Height: 5\' 7"  (170.2 cm) Weight: 58.4 kg (128 lb 12 oz) IBW/kg (Calculated) : 61.6 TPN AdjBW (KG): 58.4 Body mass index is 20.16 kg/m.  Assessment:  Tricia Brown recently admitted 10/20-11/17 with necrotizing pancreatitis with infected pseudocyst s/p cystgastrostomy and then stent removal.  Patient readmitted 11/25-12/8/21 with abdominal pain and progressive phlegmon in the LUQ.  Returned 09/26/20 with worsening abdominal pain and decreased PO intake.  Pharmacy consulted to continue TPN from PTA for recurrent pancreatitis.   Patient reports weighing ~150lbs early 2021, then lost weight with pancreatitis and was as low as 125 lb.  Since TPN started late 2021, she gained back some weight and has been maintaining at 135 lbs.  Patient remains on an 18-hr cycle TPN since discharge in Dec and has been on a liquid diet.  Any solid food would cause abdominal pain.  She missed 2 doses of TPN while holding in the ED.  Glucose / Insulin: no hx DM - glucose 66 on AM labs Electrolytes: K 2.6 > 3.2 post 2 runs, Phos 5.3 (Ca x Phos = 52, goal < 55), Mag 1.4, others WNL Renal: SCr < 1, BUN WNL LFTs / TGs: LFTs / tbili WNL Prealbumin / albumin: albumin 2.9, prealbumin 19.4 last admit, lipase up to 138 Intake / Output; MIVF: LR at 200 ml/hr GI Imaging: 1/6 CT: acute non-necrotizing pancreatitis, peripancreatic fluid collection and developing pseudocyst  Surgeries / Procedures: N/A  Central access: PICC placed 08/23/20 TPN start date: chronic  Nutritional Goals (per RD rec on 08/22/20): 2000-2200 kCal, 105-120gm protein, > 2L fluid per day  Current Nutrition:  TPN   Plan:  Continue cyclic TPN, infuse 1920 over 18 hrs:  56 ml/hr on first and last hr, 113 mk/hr x 16 hrs TPN provides 106g AA, 384g CHO and 31g ILE for a total of 2036 kCal, meeting 100% of  needs Electrolytes: Na 56mEq/L, K 59mEq/L, Ca 44mEq/L, Mag 37mEq/L (previously required 72mEq/L), no Phos today (previously required 77mmol/L), Cl:Ac 1:1 Add MVI and trace elements in TPN Initiating sensitive SSI Q8H KCL x 3 runs Mag sulfate 3gm IV x 1 LR at 200 ml/hr per MD - monitor I/O's  Standard TPN labs and nursing care orders  Auriah Hollings D. 11m, PharmD, BCPS, BCCCP 09/27/2020, 10:50 AM   Addendum: TPN formula from [facility]:  [facility] over 18 hrs:  102g AA, 260g CHO and 40g ILE for a total of 1692 kCal per day Lytes: increasing K needs, mag 46mEq/d   Randal Yepiz D. 10-28-1982, PharmD, BCPS, BCCCP 09/27/2020, 3:00 PM

## 2020-09-27 NOTE — Progress Notes (Signed)
Initial Nutrition Assessment  DOCUMENTATION CODES:   Not applicable  INTERVENTION:   -TPN management per pharmacy -Boost Breeze po TID, each supplement provides 250 kcal and 9 grams of protein  NUTRITION DIAGNOSIS:   Increased nutrient needs related to chronic illness (pancreatitis) as evidenced by estimated needs.  GOAL:   Patient will meet greater than or equal to 90% of their needs  MONITOR:   PO intake,Supplement acceptance,Diet advancement,Labs,Weight trends,Skin,I & O's  REASON FOR ASSESSMENT:   Consult New TPN/TNA  ASSESSMENT:   Tricia Brown is a 45 y.o. female with medical history significant of chronic pain, on methadone; and depression presenting with worsening abdominal pain.  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  She was most recently treated with Meropenem and MS Contin with breakthrough Oxy IR.  She was discharged home with TPN.  Since d/c, she has been taking TPN IV x 18 hours a day.  She is also on Carafate QID, Protonix once daily, Zofran TID, Bentyl, and pancreatic enzymes BID.  She is also on binder BID to help with diarrhea.  She started with temp to 99 -> 100 over the last few days.  She was no longer able to keep ginger ale and broth down.  She was having trouble keeping her pills down too.  They are trying to get her referred to [facility].  The pain has been gradually worsening for about a week.  Her pain medications are not keeping the pain at bay.  She finished her antibiotic during her last hospitalization.  Pt admitted with recurrent pancreatitis.   Reviewed I/O's: +3.2 L x 24 hours  Pt unavailable at time of attempted contact. Pt very familiar to this RD due to multiple prior hospital admissions.   Per GI notes, cyst is not ready for cyst gastrostomy. Pt with disruption in pancreatic duct, which leads to recurrent  fluid collections; pt was referred to [facility] as an outpatient for possible consideration of pancreatogram and possible PD stent. CT scan revealed acute non-necrotizing pancreatitis with fluid collection. Plan for repeat MRI/MRCPfor evaluation of pancreatic duct.   Pt started having abdominal pain with oral intake last week. Pt was started on TPN last admission and has been on 18 hour cyclic TPN at home (noted pt refused replacement on post-pyloric tube on last admission, hence TPN was started due to ongoing poor tolerance of oral intake). Pt started on clear liquid diet (pt consumes only liquids PTA).   Per pharmacy note, plan to initiate TPN today (pt missed two days on TPN while holding in the ED). Pt to receive cyclic TPN over 18 hours (56 ml/hr on first and last hr, 113 mk/hr x 16 hrs).  Regimen to provides 2035 kcals and 106 grams protein, which meets 99% of estimated kcal needs and 100% of estimated protein needs.   Noted pt has gained weight since last admission, which is favorable given pt's history of weight loss.   Medications reviewed and include creon and lactated ringers infusion @ 200 ml/hr.   Labs reviewed: K: 3.2 (on IV supplementation). Mg: 1.4 (on IV supplementation), Phos: 5.3. Inpatient orders for glycemic control are 0-9 units insulin aspart every 8 hours  Diet Order:   Diet Order            Diet clear liquid Room service appropriate? Yes; Fluid consistency: Thin  Diet effective now  EDUCATION NEEDS:   No education needs have been identified at this time  Skin:  Skin Assessment: Reviewed RN Assessment  Last BM:  09/27/19  Height:   Ht Readings from Last 1 Encounters:  08/15/20 5\' 7"  (1.702 m)    Weight:   Wt Readings from Last 1 Encounters:  09/27/20 61.2 kg    Ideal Body Weight:  61.4 kg  BMI:  Body mass index is 21.14 kg/m.  Estimated Nutritional Needs:   Kcal:  1950-2150  Protein:  105-120 grams  Fluid:  > 1.9 L    11/25/20,  RD, LDN, CDCES Registered Dietitian II Certified Diabetes Care and Education Specialist Please refer to [facility] for RD and/or RD on-call/weekend/after hours pager

## 2020-09-28 ENCOUNTER — Inpatient Hospital Stay (HOSPITAL_COMMUNITY): Payer: Medicaid Other

## 2020-09-28 LAB — COMPREHENSIVE METABOLIC PANEL
ALT: 25 U/L (ref 0–44)
AST: 19 U/L (ref 15–41)
Albumin: 3 g/dL — ABNORMAL LOW (ref 3.5–5.0)
Alkaline Phosphatase: 109 U/L (ref 38–126)
Anion gap: 10 (ref 5–15)
BUN: 13 mg/dL (ref 6–20)
CO2: 25 mmol/L (ref 22–32)
Calcium: 8.4 mg/dL — ABNORMAL LOW (ref 8.9–10.3)
Chloride: 107 mmol/L (ref 98–111)
Creatinine, Ser: 0.63 mg/dL (ref 0.44–1.00)
GFR, Estimated: >60 mL/min (ref >60)
Glucose, Bld: 132 mg/dL — ABNORMAL HIGH (ref 70–99)
Potassium: 3.3 mmol/L — ABNORMAL LOW (ref 3.5–5.1)
Sodium: 142 mmol/L (ref 135–145)
Total Bilirubin: 0.5 mg/dL (ref 0.3–1.2)
Total Protein: 5.5 g/dL — ABNORMAL LOW (ref 6.5–8.1)

## 2020-09-28 LAB — PREALBUMIN: Prealbumin: 24.5 mg/dL (ref 18–38)

## 2020-09-28 LAB — CBC
HCT: 32.6 % — ABNORMAL LOW (ref 36.0–46.0)
Hemoglobin: 10.6 g/dL — ABNORMAL LOW (ref 12.0–15.0)
MCH: 30 pg (ref 26.0–34.0)
MCHC: 32.5 g/dL (ref 30.0–36.0)
MCV: 92.4 fL (ref 80.0–100.0)
Platelets: 229 10*3/uL (ref 150–400)
RBC: 3.53 MIL/uL — ABNORMAL LOW (ref 3.87–5.11)
RDW: 14.6 % (ref 11.5–15.5)
WBC: 5.2 10*3/uL (ref 4.0–10.5)
nRBC: 0 % (ref 0.0–0.2)

## 2020-09-28 LAB — GLUCOSE, CAPILLARY
Glucose-Capillary: 116 mg/dL — ABNORMAL HIGH (ref 70–99)
Glucose-Capillary: 51 mg/dL — ABNORMAL LOW (ref 70–99)
Glucose-Capillary: 89 mg/dL (ref 70–99)
Glucose-Capillary: 80 mg/dL (ref 70–99)

## 2020-09-28 LAB — DIFFERENTIAL
Abs Immature Granulocytes: 0.02 10*3/uL (ref 0.00–0.07)
Basophils Absolute: 0.1 10*3/uL (ref 0.0–0.1)
Basophils Relative: 1 %
Eosinophils Absolute: 0.3 10*3/uL (ref 0.0–0.5)
Eosinophils Relative: 6 %
Immature Granulocytes: 0 %
Lymphocytes Relative: 33 %
Lymphs Abs: 1.7 10*3/uL (ref 0.7–4.0)
Monocytes Absolute: 0.6 10*3/uL (ref 0.1–1.0)
Monocytes Relative: 12 %
Neutro Abs: 2.5 10*3/uL (ref 1.7–7.7)
Neutrophils Relative %: 48 %

## 2020-09-28 LAB — MAGNESIUM: Magnesium: 1.5 mg/dL — ABNORMAL LOW (ref 1.7–2.4)

## 2020-09-28 LAB — PHOSPHORUS: Phosphorus: 3.9 mg/dL (ref 2.5–4.6)

## 2020-09-28 LAB — TRIGLYCERIDES: Triglycerides: 69 mg/dL (ref <150)

## 2020-09-28 MED ORDER — GADOBUTROL 1 MMOL/ML IV SOLN
6.0000 mL | Freq: Once | INTRAVENOUS | Status: AC | PRN
  Administered 2020-09-28: 6 mL via INTRAVENOUS

## 2020-09-28 MED ORDER — DEXTROSE 50 % IV SOLN
INTRAVENOUS | Status: AC
  Filled 2020-09-28: qty 50

## 2020-09-28 MED ORDER — DEXTROSE 50 % IV SOLN
25.0000 g | INTRAVENOUS | Status: AC
  Administered 2020-09-28: 25 g via INTRAVENOUS

## 2020-09-28 MED ORDER — MAGNESIUM SULFATE 2 GM/50ML IV SOLN
2.0000 g | Freq: Once | INTRAVENOUS | Status: AC
  Administered 2020-09-28: 2 g via INTRAVENOUS
  Filled 2020-09-28: qty 50

## 2020-09-28 MED ORDER — POTASSIUM CHLORIDE 10 MEQ/50ML IV SOLN
10.0000 meq | INTRAVENOUS | Status: AC
  Administered 2020-09-28 (×3): 10 meq via INTRAVENOUS
  Filled 2020-09-28 (×3): qty 50

## 2020-09-28 MED ORDER — TRACE MINERALS CU-MN-SE-ZN 300-55-60-3000 MCG/ML IV SOLN
INTRAVENOUS | Status: AC
  Filled 2020-09-28: qty 704

## 2020-09-28 NOTE — Progress Notes (Signed)
PHARMACY - TOTAL PARENTERAL NUTRITION CONSULT NOTE  Indication: Complicated pancreatitis with loculated pseudocysts  Patient Measurements: Height: 5\' 7"  (170.2 cm) Weight: 58.4 kg (128 lb 12 oz) IBW/kg (Calculated) : 61.6 TPN AdjBW (KG): 58.4 Body mass index is 20.16 kg/m.  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  Patient readmitted 11/25-12/8/21 with abdominal pain and progressive phlegmon in the LUQ.  Returned 09/26/20 with worsening abdominal pain and decreased PO intake.  Pharmacy consulted to continue TPN from PTA for recurrent pancreatitis.   Patient reports weighing ~150lbs early 2021, then lost weight with pancreatitis and was as low as 125 lb.  Since TPN started late 2021, she gained back some weight and has been maintaining at 135 lbs.  Patient remains on an 18-hr cycle TPN since discharge in Dec and has been on a liquid diet.  Any solid food would cause abdominal pain.  She missed 2 doses of TPN while holding in the ED.  Glucose / Insulin: no hx DM. CBGs acceptable - utilized 2 units SSI in last 24hrs Electrolytes: K 3.2>3.3 (s/p K runs x 3 yesterday), Phos 5.3>3.9, Mag 1.4>1.5 (s/p Mag sulfate 3g IV x 1 yesterday), others WNL Renal: SCr < 1, BUN WNL LFTs / TGs: LFTs / Tbili / TG WNL Prealbumin / albumin: albumin 3, prealbumin 24.5, lipase up to 138 (1/5) Intake / Output; MIVF: LBM 1/6; MIVF: LR at 200 ml/hr GI Imaging:1/6 CT: acute non-necrotizing pancreatitis, peripancreatic fluid collection and developing pseudocyst  Surgeries / Procedures: N/A  Central access: PICC placed 08/23/20 TPN start date: chronic  Nutritional Goals (per RD rec on 09/27/20): 1950-2150 kCal, 105-120g protein, >1.9L fluid per day  TPN formula from [facility]:  [facility] over 18 hrs:  102g AA, 260g CHO and 40g ILE for a total of 1692 kCal per day Lytes: increasing K needs, mag 74mEq/d  Current  Nutrition:  TPN Advanced to FLD 1/7 - 100% of 1 meal charted yesterday Boost TID - 1 dose charted yesterday  Plan:  Continue cyclic TPN, infuse 1920 ml over 18 hrs:  56 ml/hr on first and last hr, 113 ml/hr x 16 hrs TPN provides 106g AA, 384g CHO and 31g SMOF lipids, for a total of 2036 kCal, meeting 100% of needs Electrolytes: Na 70mEq/L, increase K to 35mEq/L, Ca 81mEq/L, increase Mag to 32mEq/L, add back Phos 3 mmol/L, Cl:Ac 1:1 Add MVI and trace elements in TPN Continue sensitive SSI Q8H and adjust as needed Give K runs x 3 Mag sulfate 2g IV x 1 already ordered per MD and given today LR at 200 ml/hr per MD - monitor I/O's, f/u with MD for adjustments Monitor TPN labs   18m, PharmD, BCPS Please check AMION for all [facility] Pharmacy contact numbers Clinical Pharmacist 09/28/2020 9:28 AM

## 2020-09-28 NOTE — Progress Notes (Addendum)
PROGRESS NOTE    Tricia Brown  ELF:[id number] DOB: 09-Aug-1976 DOA: 09/25/2020 PCP: [name], MD   Brief Narrative:  HPI: Tricia Brown is a 45 y.o. female with medical history significant of chronic pain, on methadone; and depression presenting with worsening abdominal pain.  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  She was most recently treated with Meropenem and MS Contin with breakthrough Oxy IR.  She was discharged home with TPN.  Since d/c, she has been taking TPN IV x 18 hours a day.  She is also on Carafate QID, Protonix once daily, Zofran TID, Bentyl, and pancreatic enzymes BID.  She is also on binder BID to help with diarrhea.  She started with temp to 99 -> 100 over the last few days.  She was no longer able to keep ginger ale and broth down.  She was having trouble keeping her pills down too.  They are trying to get her referred to [facility].  The pain has been gradually worsening for about a week.  Her pain medications are not keeping the pain at bay.  She finished her antibiotic during her last hospitalization.    ED Course:  Waiting to hear back from GI. Complex history with recent necrotizing pancreatitis.  Back with recurrent symptoms - pain, n/v/d, only drinks broth and on TPN.  Febrile to 101 at home.  PICC line draining.  CT with acute non-necrotizing pancreatitis with peripancreatitis fluid collection.  Defer admission for now until GI calls back with plan.  Assessment & Plan:   Principal Problem:   Acute pancreatitis Active Problems:   Depression   Opioid use disorder, severe, dependence (HCC)   Acute/recurrent pancreatitis/chronic pain -Patient with severe necrotizing pancreatitis, with 2 prior prolonged hospitalizations including cystogastrostomy with stent placement and then removal -Now presenting with recurrent pain, n/v.  Seen by GI. They are considering for another stent.  She was advanced to full liquid diet however this morning she has more pain and more GERD with nausea but no vomiting.  Seen by GI today and they are getting MRI and MRCP today.  Continue current supportive care.  Hypokalemia/hypomagnesemia: Replace.  Recheck in the morning.  COPD: Not in exacerbation. -Continue Symbicort  DVT prophylaxis: enoxaparin (LOVENOX) injection 40 mg Start: 09/26/20 1500   Code Status: Full Code  Family Communication: None present at bedside.  Plan of care discussed with patient in length and he verbalized understanding and agreed with it.  Status is: Inpatient  Remains inpatient appropriate because:Inpatient level of care appropriate due to severity of illness   Dispo: The patient is from: Home              Anticipated d/c is to: Home              Anticipated d/c date is: 2 days              Patient currently is not medically stable to d/c.        Estimated body mass index is 21.14 kg/m as calculated from the following:   Height as of 08/15/20: 5\' 7"  (1.702 m).   Weight as of this encounter: 61.2 kg.      Nutritional status:  Nutrition Problem: Increased nutrient needs Etiology: chronic illness (pancreatitis)   Signs/Symptoms: estimated needs   Interventions: Boost Breeze,TPN    Consultants:   GI  Procedures:   None  Antimicrobials:  Anti-infectives (From  admission, onward)   None         Subjective: Seen and examined.  She states that her pain is slightly worse than yesterday, 7 out of 10.  Some nausea but no vomiting.  Objective: Vitals:   09/27/20 0949 09/27/20 1627 09/27/20 2027 09/28/20 0345  BP: 129/86 131/84 137/81 (!) 141/76  Pulse: 70 68 69 62  Resp: 17 16 16 17   Temp: 98.4 F (36.9 C) 98.2 F (36.8 C) 97.9 F (36.6 C) 97.7 F (36.5 C)  TempSrc: Oral Oral Oral Oral  SpO2: 100% 100% 100% 100%  Weight:        Intake/Output Summary (Last 24 hours) at  09/28/2020 1243 Last data filed at 09/28/2020 0651 Gross per 24 hour  Intake 5651.29 ml  Output --  Net 5651.29 ml   Filed Weights   09/27/20 0900  Weight: 61.2 kg    Examination:  General exam: Appears calm but in pain Respiratory system: Clear to auscultation. Respiratory effort normal. Cardiovascular system: S1 & S2 heard, RRR. No JVD, murmurs, rubs, gallops or clicks. No pedal edema. Gastrointestinal system: Abdomen is nondistended, soft and generalized tenderness which is more pronounced in the upper abdomen, no organomegaly or masses felt. Normal bowel sounds heard. Central nervous system: Alert and oriented. No focal neurological deficits. Extremities: Symmetric 5 x 5 power. Skin: No rashes, lesions or ulcers.  Psychiatry: Judgement and insight appear normal. Mood & affect appropriate.   Data Reviewed: I have personally reviewed following labs and imaging studies  CBC: Recent Labs  Lab 09/25/20 1757 09/27/20 0507 09/28/20 0346  WBC 10.5 5.7 5.2  NEUTROABS  --   --  2.5  HGB 12.6 10.6* 10.6*  HCT 38.7 31.5* 32.6*  MCV 91.5 92.1 92.4  PLT 298 212 229   Basic Metabolic Panel: Recent Labs  Lab 09/25/20 1757 09/26/20 0803 09/27/20 0507 09/28/20 0346  NA 141  --  140 142  K 2.6*  --  3.2* 3.3*  CL 106  --  109 107  CO2 23  --  22 25  GLUCOSE 87  --  66* 132*  BUN 19  --  18 13  CREATININE 0.70  --  0.80 0.63  CALCIUM 9.5  --  8.9 8.4*  MG  --  1.6* 1.4* 1.5*  PHOS  --   --  5.3* 3.9   GFR: Estimated Creatinine Clearance: 86.7 mL/min (by C-G formula based on SCr of 0.63 mg/dL). Liver Function Tests: Recent Labs  Lab 09/25/20 1757 09/27/20 0507 09/28/20 0346  AST 20 19 19   ALT 38 26 25  ALKPHOS 111 102 109  BILITOT 0.6 0.9 0.5  PROT 6.9 5.5* 5.5*  ALBUMIN 3.8 2.9* 3.0*   Recent Labs  Lab 09/25/20 1757  LIPASE 138*   No results for input(s): AMMONIA in the last 168 hours. Coagulation Profile: No results for input(s): INR, PROTIME in the last 168  hours. Cardiac Enzymes: No results for input(s): CKTOTAL, CKMB, CKMBINDEX, TROPONINI in the last 168 hours. BNP (last 3 results) No results for input(s): PROBNP in the last 8760 hours. HbA1C: No results for input(s): HGBA1C in the last 72 hours. CBG: Recent Labs  Lab 09/27/20 2157 09/28/20 0602  GLUCAP 171* 116*   Lipid Profile: Recent Labs    09/28/20 0346  TRIG 69   Thyroid Function Tests: No results for input(s): TSH, T4TOTAL, FREET4, T3FREE, THYROIDAB in the last 72 hours. Anemia Panel: No results for input(s): VITAMINB12, FOLATE, FERRITIN, TIBC, IRON, RETICCTPCT  in the last 72 hours. Sepsis Labs: No results for input(s): PROCALCITON, LATICACIDVEN in the last 168 hours.  Recent Results (from the past 240 hour(s))  Resp Panel by RT-PCR (Flu A&B, Covid) Nasopharyngeal Swab     Status: None   Collection Time: 09/26/20 12:52 PM   Specimen: Nasopharyngeal Swab; Nasopharyngeal(NP) swabs in vial transport medium  Result Value Ref Range Status   SARS Coronavirus 2 by RT PCR NEGATIVE NEGATIVE Final    Comment: (NOTE) SARS-CoV-2 target nucleic acids are NOT DETECTED.  The SARS-CoV-2 RNA is generally detectable in upper respiratory specimens during the acute phase of infection. The lowest concentration of SARS-CoV-2 viral copies this assay can detect is 138 copies/mL. A negative result does not preclude SARS-Cov-2 infection and should not be used as the sole basis for treatment or other patient management decisions. A negative result may occur with  improper specimen collection/handling, submission of specimen other than nasopharyngeal swab, presence of viral mutation(s) within the areas targeted by this assay, and inadequate number of viral copies(<138 copies/mL). A negative result must be combined with clinical observations, patient history, and epidemiological information. The expected result is Negative.  Fact Sheet for Patients:   BloggerCourse.com  Fact Sheet for Healthcare Providers:  SeriousBroker.it  This test is no t yet approved or cleared by the [place] FDA and  has been authorized for detection and/or diagnosis of SARS-CoV-2 by FDA under an Emergency Use Authorization (EUA). This EUA will remain  in effect (meaning this test can be used) for the duration of the COVID-19 declaration under Section 564(b)(1) of the Act, 21 U.S.C.section 360bbb-3(b)(1), unless the authorization is terminated  or revoked sooner.       Influenza A by PCR NEGATIVE NEGATIVE Final   Influenza B by PCR NEGATIVE NEGATIVE Final    Comment: (NOTE) The Xpert Xpress SARS-CoV-2/FLU/RSV plus assay is intended as an aid in the diagnosis of influenza from Nasopharyngeal swab specimens and should not be used as a sole basis for treatment. Nasal washings and aspirates are unacceptable for Xpert Xpress SARS-CoV-2/FLU/RSV testing.  Fact Sheet for Patients: BloggerCourse.com  Fact Sheet for Healthcare Providers: SeriousBroker.it  This test is not yet approved or cleared by the [place] FDA and has been authorized for detection and/or diagnosis of SARS-CoV-2 by FDA under an Emergency Use Authorization (EUA). This EUA will remain in effect (meaning this test can be used) for the duration of the COVID-19 declaration under Section 564(b)(1) of the Act, 21 U.S.C. section 360bbb-3(b)(1), unless the authorization is terminated or revoked.  Performed at [facility], [address] [place], [place] [address]       Radiology Studies: No results found.  Scheduled Meds: . Chlorhexidine Gluconate Cloth  6 each Topical Daily  . colestipol  1 g Oral BID  . enoxaparin (LOVENOX) injection  40 mg Subcutaneous Q24H  . feeding supplement  1 Container Oral TID BM  . insulin aspart  0-9 Units Subcutaneous Q8H  .  lipase/protease/amylase  72,000 Units Oral TID WC  . montelukast  10 mg Oral Daily  . pantoprazole (PROTONIX) IV  40 mg Intravenous Q12H  . sodium chloride flush  10-40 mL Intracatheter Q12H  . sucralfate  1 g Oral QID   Continuous Infusions: . lactated ringers 200 mL/hr at 09/28/20 0816  . potassium chloride 10 mEq (09/28/20 1229)  . TPN CYCLIC-ADULT (ION)       LOS: 2 days   Time spent: 29 minutes   Hughie Closs,  MD [facility]  09/28/2020, 12:43 PM   To contact the attending provider between 7A-7P or the covering provider during after hours 7P-7A, please log into the web site [email or web address].

## 2020-09-28 NOTE — Progress Notes (Signed)
[facility] Progress Note  Tricia Brown 45 y.o. 1976-05-18  CC: Recurrent pancreatitis   Subjective: Patient seen and examined at bedside.  Continues to have abdominal pain.  Complaining of worsening reflux and nausea.  Denies any vomiting.  Denies diarrhea.  Denies blood in the stool  ROS : Afebrile, negative for chest pain   Objective: Vital signs in last 24 hours: Vitals:   09/27/20 2027 09/28/20 0345  BP: 137/81 (!) 141/76  Pulse: 69 62  Resp: 16 17  Temp: 97.9 F (36.6 C) 97.7 F (36.5 C)  SpO2: 100% 100%    Physical Exam:  General:  Alert, cooperative, no distress, appears stated age  Head:  Normocephalic, without obvious abnormality, atraumatic  Eyes:  , EOM's intact,   Lungs:   Clear to auscultation bilaterally, anterior exam only  Heart:  Regular rate and rhythm, S1, S2 normal  Abdomen:   Soft, epigastric and left upper quadrant tenderness to palpation, bowel sounds present, no peritoneal signs  Extremities: Extremities normal, atraumatic, no  edema       Lab Results: Recent Labs    09/27/20 0507 09/28/20 0346  NA 140 142  K 3.2* 3.3*  CL 109 107  CO2 22 25  GLUCOSE 66* 132*  BUN 18 13  CREATININE 0.80 0.63  CALCIUM 8.9 8.4*  MG 1.4* 1.5*  PHOS 5.3* 3.9   Recent Labs    09/27/20 0507 09/28/20 0346  AST 19 19  ALT 26 25  ALKPHOS 102 109  BILITOT 0.9 0.5  PROT 5.5* 5.5*  ALBUMIN 2.9* 3.0*   Recent Labs    09/27/20 0507 09/28/20 0346  WBC 5.7 5.2  NEUTROABS  --  2.5  HGB 10.6* 10.6*  HCT 31.5* 32.6*  MCV 92.1 92.4  PLT 212 229   No results for input(s): LABPROT, INR in the last 72 hours.    Assessment/Plan:  -Acute/recurrent pancreatitis.  -History of necrotizing pancreatitis status post cystogastrostomy on July 22, 2020.  -Poor oral intake.  Currently on TPN  - GERD    Recommendations  --------------------------  -Case was discussed with Dr. Meridee Score via secure chat.  No urgent need for repeat  cystogastrostomy or pancreatic stent placement at this time.  -Get MRI MRCP today -Continue supportive care with IV hydration.  -Continue Creon -Continue Protonix IV twice daily.  Continue Carafate. -GI will follow     Kathi Der MD, FACP 09/28/2020, 10:10 AM  Contact #  [phone]

## 2020-09-29 LAB — COMPREHENSIVE METABOLIC PANEL
ALT: 31 U/L (ref 0–44)
AST: 21 U/L (ref 15–41)
Albumin: 3 g/dL — ABNORMAL LOW (ref 3.5–5.0)
Alkaline Phosphatase: 109 U/L (ref 38–126)
Anion gap: 7 (ref 5–15)
BUN: 10 mg/dL (ref 6–20)
CO2: 29 mmol/L (ref 22–32)
Calcium: 8.8 mg/dL — ABNORMAL LOW (ref 8.9–10.3)
Chloride: 106 mmol/L (ref 98–111)
Creatinine, Ser: 0.6 mg/dL (ref 0.44–1.00)
GFR, Estimated: >60 mL/min (ref >60)
Glucose, Bld: 118 mg/dL — ABNORMAL HIGH (ref 70–99)
Potassium: 2.9 mmol/L — ABNORMAL LOW (ref 3.5–5.1)
Sodium: 142 mmol/L (ref 135–145)
Total Bilirubin: 0.2 mg/dL — ABNORMAL LOW (ref 0.3–1.2)
Total Protein: 5.8 g/dL — ABNORMAL LOW (ref 6.5–8.1)

## 2020-09-29 LAB — MAGNESIUM: Magnesium: 1.7 mg/dL (ref 1.7–2.4)

## 2020-09-29 LAB — PHOSPHORUS: Phosphorus: 4 mg/dL (ref 2.5–4.6)

## 2020-09-29 LAB — GLUCOSE, CAPILLARY
Glucose-Capillary: 109 mg/dL — ABNORMAL HIGH (ref 70–99)
Glucose-Capillary: 104 mg/dL — ABNORMAL HIGH (ref 70–99)
Glucose-Capillary: 71 mg/dL (ref 70–99)

## 2020-09-29 MED ORDER — TRACE MINERALS CU-MN-SE-ZN 300-55-60-3000 MCG/ML IV SOLN
INTRAVENOUS | Status: AC
  Filled 2020-09-29: qty 704

## 2020-09-29 MED ORDER — MAGNESIUM SULFATE 2 GM/50ML IV SOLN
2.0000 g | Freq: Once | INTRAVENOUS | Status: AC
  Administered 2020-09-29: 2 g via INTRAVENOUS
  Filled 2020-09-29: qty 50

## 2020-09-29 MED ORDER — POTASSIUM CHLORIDE CRYS ER 20 MEQ PO TBCR
40.0000 meq | EXTENDED_RELEASE_TABLET | Freq: Once | ORAL | Status: AC
  Administered 2020-09-29: 40 meq via ORAL
  Filled 2020-09-29: qty 2

## 2020-09-29 MED ORDER — POTASSIUM CHLORIDE 10 MEQ/50ML IV SOLN
10.0000 meq | INTRAVENOUS | Status: AC
  Administered 2020-09-29 (×6): 10 meq via INTRAVENOUS
  Filled 2020-09-29 (×6): qty 50

## 2020-09-29 NOTE — Progress Notes (Signed)
PROGRESS NOTE    Tricia Brown  ZOX:[id number] DOB: March 07, 1976 DOA: 09/25/2020 PCP: [name], [name], MD   Brief Narrative:  HPI: Tricia Brown is a 45 y.o. female with medical history significant of chronic pain, on methadone; and depression presenting with worsening abdominal pain.  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  She was most recently treated with Meropenem and MS Contin with breakthrough Oxy IR.  She was discharged home with TPN.  Since d/c, she has been taking TPN IV x 18 hours a day.  She is also on Carafate QID, Protonix once daily, Zofran TID, Bentyl, and pancreatic enzymes BID.  She is also on binder BID to help with diarrhea.  She started with temp to 99 -> 100 over the last few days.  She was no longer able to keep ginger ale and broth down.  She was having trouble keeping her pills down too.  They are trying to get her referred to [facility].  The pain has been gradually worsening for about a week.  Her pain medications are not keeping the pain at bay.  She finished her antibiotic during her last hospitalization.    ED Course:  Waiting to hear back from GI. Complex history with recent necrotizing pancreatitis.  Back with recurrent symptoms - pain, n/v/d, only drinks broth and on TPN.  Febrile to 101 at home.  PICC line draining.  CT with acute non-necrotizing pancreatitis with peripancreatitis fluid collection.  Defer admission for now until GI calls back with plan.  Assessment & Plan:   Principal Problem:   Acute pancreatitis Active Problems:   Depression   Opioid use disorder, severe, dependence (HCC)   Acute/recurrent pancreatitis/chronic pain -Patient with severe necrotizing pancreatitis, with 2 prior prolonged hospitalizations including cystogastrostomy with stent placement and then removal -Now presenting with recurrent pain, n/v.  Seen by GI. They are considering for another stent.  MRCP shows enlarging cyst at the tail of the pancreas.  Dr. Meridee ScoreMansouraty to see patients tomorrow.  Diet has been advanced to soft.  She is still having significant symptoms of abdominal pain and nausea.  Management per GI.  Hypokalemia/hypomagnesemia: Magnesium normal at 1.7.  Once again hypokalemia with 2.9.  Will replace both of them.  COPD: Not in exacerbation. -Continue Symbicort  DVT prophylaxis: enoxaparin (LOVENOX) injection 40 mg Start: 09/26/20 1500   Code Status: Full Code  Family Communication: None present at bedside.  Plan of care discussed with patient in length and he verbalized understanding and agreed with it.  Status is: Inpatient  Remains inpatient appropriate because:Inpatient level of care appropriate due to severity of illness   Dispo: The patient is from: Home              Anticipated d/c is to: Home              Anticipated d/c date is: 2 days              Patient currently is not medically stable to d/c.        Estimated body mass index is 21.14 kg/m as calculated from the following:   Height as of 08/15/20: 5\' 7"  (1.702 m).   Weight as of this encounter: 61.2 kg.      Nutritional status:  Nutrition Problem: Increased nutrient needs Etiology: chronic illness (pancreatitis)   Signs/Symptoms: estimated needs   Interventions: Boost Breeze,TPN    Consultants:   GI  Procedures:   None  Antimicrobials:  Anti-infectives (From admission, onward)   None         Subjective: Seen and examined.  Still with significant abdominal pain, 7 out of 10 with significant GERD and nausea but no vomiting.  Tolerating only ice chips.  Objective: Vitals:   09/28/20 1507 09/28/20 2056 09/29/20 0448 09/29/20 1254  BP: (!) 151/80 (!) 152/82 138/85 (!) 145/61  Pulse: 75 63 (!) 59 74  Resp: 16 16 17 18   Temp: 98.2 F (36.8 C) 98.6 F (37 C) 98.3 F (36.8 C) 97.9 F (36.6 C)  TempSrc: Oral Oral  Oral Oral  SpO2: 100% 100% 100% 100%  Weight:        Intake/Output Summary (Last 24 hours) at 09/29/2020 1303 Last data filed at 09/29/2020 0448 Gross per 24 hour  Intake 3690.08 ml  Output --  Net 3690.08 ml   Filed Weights   09/27/20 0900  Weight: 61.2 kg    Examination:  General exam: Appears calm and comfortable  Respiratory system: Clear to auscultation. Respiratory effort normal. Cardiovascular system: S1 & S2 heard, RRR. No JVD, murmurs, rubs, gallops or clicks. No pedal edema. Gastrointestinal system: Abdomen is nondistended, firm and tender in the upper abdomen. No organomegaly or masses felt. Normal bowel sounds heard. Central nervous system: Alert and oriented. No focal neurological deficits. Extremities: Symmetric 5 x 5 power. Skin: No rashes, lesions or ulcers.  Psychiatry: Judgement and insight appear normal. Mood & affect appropriate.    Data Reviewed: I have personally reviewed following labs and imaging studies  CBC: Recent Labs  Lab 09/25/20 1757 09/27/20 0507 09/28/20 0346  WBC 10.5 5.7 5.2  NEUTROABS  --   --  2.5  HGB 12.6 10.6* 10.6*  HCT 38.7 31.5* 32.6*  MCV 91.5 92.1 92.4  PLT 298 212 229   Basic Metabolic Panel: Recent Labs  Lab 09/25/20 1757 09/26/20 0803 09/27/20 0507 09/28/20 0346 09/29/20 0323  NA 141  --  140 142 142  K 2.6*  --  3.2* 3.3* 2.9*  CL 106  --  109 107 106  CO2 23  --  22 25 29   GLUCOSE 87  --  66* 132* 118*  BUN 19  --  18 13 10   CREATININE 0.70  --  0.80 0.63 0.60  CALCIUM 9.5  --  8.9 8.4* 8.8*  MG  --  1.6* 1.4* 1.5* 1.7  PHOS  --   --  5.3* 3.9 4.0   GFR: Estimated Creatinine Clearance: 86.7 mL/min (by C-G formula based on SCr of 0.6 mg/dL). Liver Function Tests: Recent Labs  Lab 09/25/20 1757 09/27/20 0507 09/28/20 0346 09/29/20 0323  AST 20 19 19 21   ALT 38 26 25 31   ALKPHOS 111 102 109 109  BILITOT 0.6 0.9 0.5 0.2*  PROT 6.9 5.5* 5.5* 5.8*  ALBUMIN 3.8 2.9* 3.0* 3.0*   Recent Labs  Lab  09/25/20 1757  LIPASE 138*   No results for input(s): AMMONIA in the last 168 hours. Coagulation Profile: No results for input(s): INR, PROTIME in the last 168 hours. Cardiac Enzymes: No results for input(s): CKTOTAL, CKMB, CKMBINDEX, TROPONINI in the last 168 hours. BNP (last 3 results) No results for input(s): PROBNP in the last 8760 hours. HbA1C: No results for input(s): HGBA1C in the last 72 hours. CBG: Recent Labs  Lab 09/28/20 1535 09/28/20 1601 09/28/20 2053 09/29/20 0445 09/29/20 1248  GLUCAP 51* 89 80 109* 104*   Lipid Profile: Recent Labs  09/28/20 0346  TRIG 69   Thyroid Function Tests: No results for input(s): TSH, T4TOTAL, FREET4, T3FREE, THYROIDAB in the last 72 hours. Anemia Panel: No results for input(s): VITAMINB12, FOLATE, FERRITIN, TIBC, IRON, RETICCTPCT in the last 72 hours. Sepsis Labs: No results for input(s): PROCALCITON, LATICACIDVEN in the last 168 hours.  Recent Results (from the past 240 hour(s))  Resp Panel by RT-PCR (Flu A&B, Covid) Nasopharyngeal Swab     Status: None   Collection Time: 09/26/20 12:52 PM   Specimen: Nasopharyngeal Swab; Nasopharyngeal(NP) swabs in vial transport medium  Result Value Ref Range Status   SARS Coronavirus 2 by RT PCR NEGATIVE NEGATIVE Final    Comment: (NOTE) SARS-CoV-2 target nucleic acids are NOT DETECTED.  The SARS-CoV-2 RNA is generally detectable in upper respiratory specimens during the acute phase of infection. The lowest concentration of SARS-CoV-2 viral copies this assay can detect is 138 copies/mL. A negative result does not preclude SARS-Cov-2 infection and should not be used as the sole basis for treatment or other patient management decisions. A negative result may occur with  improper specimen collection/handling, submission of specimen other than nasopharyngeal swab, presence of viral mutation(s) within the areas targeted by this assay, and inadequate number of viral copies(<138  copies/mL). A negative result must be combined with clinical observations, patient history, and epidemiological information. The expected result is Negative.  Fact Sheet for Patients:  BloggerCourse.com  Fact Sheet for Healthcare Providers:  SeriousBroker.it  This test is no t yet approved or cleared by the [place] FDA and  has been authorized for detection and/or diagnosis of SARS-CoV-2 by FDA under an Emergency Use Authorization (EUA). This EUA will remain  in effect (meaning this test can be used) for the duration of the COVID-19 declaration under Section 564(b)(1) of the Act, 21 U.S.C.section 360bbb-3(b)(1), unless the authorization is terminated  or revoked sooner.       Influenza A by PCR NEGATIVE NEGATIVE Final   Influenza B by PCR NEGATIVE NEGATIVE Final    Comment: (NOTE) The Xpert Xpress SARS-CoV-2/FLU/RSV plus assay is intended as an aid in the diagnosis of influenza from Nasopharyngeal swab specimens and should not be used as a sole basis for treatment. Nasal washings and aspirates are unacceptable for Xpert Xpress SARS-CoV-2/FLU/RSV testing.  Fact Sheet for Patients: BloggerCourse.com  Fact Sheet for Healthcare Providers: SeriousBroker.it  This test is not yet approved or cleared by the [place] FDA and has been authorized for detection and/or diagnosis of SARS-CoV-2 by FDA under an Emergency Use Authorization (EUA). This EUA will remain in effect (meaning this test can be used) for the duration of the COVID-19 declaration under Section 564(b)(1) of the Act, 21 U.S.C. section 360bbb-3(b)(1), unless the authorization is terminated or revoked.  Performed at [facility], [address] [place], [place] [address]       Radiology Studies: MR ABDOMEN MRCP W WO CONTAST  Result Date: 09/29/2020 CLINICAL DATA:  Acute on chronic pancreatitis.  Peripancreatic fluid collection. EXAM: MRI ABDOMEN WITHOUT AND WITH CONTRAST (INCLUDING MRCP) TECHNIQUE: Multiplanar multisequence MR imaging of the abdomen was performed both before and after the administration of intravenous contrast. Heavily T2-weighted images of the biliary and pancreatic ducts were obtained, and three-dimensional MRCP images were rendered by post processing. CONTRAST:  63mL GADAVIST GADOBUTROL 1 MMOL/ML IV SOLN COMPARISON:  CT on 09/26/2020 FINDINGS: Lower chest: No acute findings. Hepatobiliary: No hepatic masses identified. Prior cholecystectomy. No evidence of biliary ductal dilatation. Pancreas: No evidence of  pancreatic edema or acute peripancreatic inflammatory changes. No evidence of pancreatic mass or necrosis. No evidence of pancreatic ductal dilatation. A well circumscribed fluid collection with thin peripheral rim enhancement is seen along the dorsal aspect of the pancreatic tail, which measures 7.0 by 2.4 cm. This has mildly increased in size from 5.6 x 1.8 cm previously, and is consistent with a pancreatic cyst. No other peripancreatic fluid collections are seen. Spleen:  Within normal limits in size and appearance. Adrenals/Urinary Tract: No masses identified. No evidence of hydronephrosis. Stomach/Bowel: Visualized portion unremarkable. Vascular/Lymphatic: No pathologically enlarged lymph nodes identified. No abdominal aortic aneurysm. Other:  None. Musculoskeletal:  No suspicious bone lesions identified. IMPRESSION: Pancreatic cyst along the dorsal aspect of the pancreatic tail, mildly increased in size since previous study. No other complication identified. Prior cholecystectomy. No evidence of biliary ductal dilatation. Electronically Signed   By: Danae Orleans M.D.   On: 09/29/2020 08:44    Scheduled Meds: . Chlorhexidine Gluconate Cloth  6 each Topical Daily  . colestipol  1 g Oral BID  . enoxaparin (LOVENOX) injection  40 mg Subcutaneous Q24H  . feeding supplement  1  Container Oral TID BM  . insulin aspart  0-9 Units Subcutaneous Q8H  . lipase/protease/amylase  72,000 Units Oral TID WC  . montelukast  10 mg Oral Daily  . pantoprazole (PROTONIX) IV  40 mg Intravenous Q12H  . sodium chloride flush  10-40 mL Intracatheter Q12H  . sucralfate  1 g Oral QID   Continuous Infusions: . lactated ringers 200 mL/hr at 09/28/20 1725  . potassium chloride 10 mEq (09/29/20 1249)  . TPN CYCLIC-ADULT (ION)       LOS: 3 days   Time spent: 27 minutes   Hughie Closs, MD [facility]  09/29/2020, 1:03 PM   To contact the attending provider between 7A-7P or the covering provider during after hours 7P-7A, please log into the web site [email or web address].

## 2020-09-29 NOTE — Progress Notes (Signed)
[facility] Gastroenterology Progress Note  Tricia Brown 45 y.o. [birth year]  CC: Recurrent pancreatitis   Subjective: Patient seen and examined at bedside.  Continues to have abdominal pain.  Continues to have reflux.  Denies nausea or vomiting.  Denies diarrhea or constipation.  ROS : Afebrile, negative for chest pain   Objective: Vital signs in last 24 hours: Vitals:   09/28/20 2056 09/29/20 0448  BP: (!) 152/82 138/85  Pulse: 63 (!) 59  Resp: 16 17  Temp: 98.6 F (37 C) 98.3 F (36.8 C)  SpO2: 100% 100%    Physical Exam:  General:  Alert, cooperative, no distress, appears stated age  Head:  Normocephalic, without obvious abnormality, atraumatic  Eyes:  , EOM's intact,   Lungs:   Clear to auscultation bilaterally, anterior exam only  Heart:  Regular rate and rhythm, S1, S2 normal  Abdomen:   Soft, epigastric and left upper quadrant tenderness to palpation, bowel sounds present, no peritoneal signs  Extremities: Extremities normal, atraumatic, no  edema       Lab Results: Recent Labs    09/28/20 0346 09/29/20 0323  NA 142 142  K 3.3* 2.9*  CL 107 106  CO2 25 29  GLUCOSE 132* 118*  BUN 13 10  CREATININE 0.63 0.60  CALCIUM 8.4* 8.8*  MG 1.5* 1.7  PHOS 3.9 4.0   Recent Labs    09/28/20 0346 09/29/20 0323  AST 19 21  ALT 25 31  ALKPHOS 109 109  BILITOT 0.5 0.2*  PROT 5.5* 5.8*  ALBUMIN 3.0* 3.0*   Recent Labs    09/27/20 0507 09/28/20 0346  WBC 5.7 5.2  NEUTROABS  --  2.5  HGB 10.6* 10.6*  HCT 31.5* 32.6*  MCV 92.1 92.4  PLT 212 229   No results for input(s): LABPROT, INR in the last 72 hours.    Assessment/Plan:  -Acute/recurrent pancreatitis.  -History of necrotizing pancreatitis status post cystogastrostomy on July 22, 2020.  -Poor oral intake.  Currently on TPN  - GERD    Recommendations  --------------------------  -MRI MRCP showed enlarging pancreatic tail cyst now at 7 cm. -Recommend discussion with Dr. Meridee Score tomorrow  for further management. -Continue supportive care with IV hydration.  -Continue Creon -Continue Protonix IV twice daily.  Continue Carafate -Advance diet to soft -GI will follow     Kathi Der MD, FACP 09/29/2020, 11:18 AM  Contact #  [phone]

## 2020-09-29 NOTE — Progress Notes (Signed)
PHARMACY - TOTAL PARENTERAL NUTRITION CONSULT NOTE  Indication: Complicated pancreatitis with loculated pseudocysts  Patient Measurements: Height: 5\' 7"  (170.2 cm) Weight: 58.4 kg (128 lb 12 oz) IBW/kg (Calculated) : 61.6 TPN AdjBW (KG): 58.4 Body mass index is 20.16 kg/m.  Assessment:  2 YOF recently admitted 10/20-11/17 with necrotizing pancreatitis with infected pseudocyst s/p cystgastrostomy and then stent removal.  Patient readmitted 11/25-12/8/21 with abdominal pain and progressive phlegmon in the LUQ.  Returned 09/26/20 with worsening abdominal pain and decreased PO intake.  Pharmacy consulted to continue TPN from PTA for recurrent pancreatitis.   Patient reports weighing ~150lbs early 2021, then lost weight with pancreatitis and was as low as 125 lb.  Since TPN started late 2021, she gained back some weight and has been maintaining at 135 lbs.  Patient remains on an 18-hr cycle TPN since discharge in Dec and has been on a liquid diet.  Any solid food would cause abdominal pain.  She missed 2 doses of TPN while holding in the ED.  Glucose / Insulin: no hx DM. CBGs controlled, some hypoglycemia - utilized 0 units SSI in last 24hrs Electrolytes: K 3.3>2.9 (s/p K runs x 3 yesterday), Mag 1.5>1.7 (s/p Mag sulfate 2g IV x 1 yesterday), others WNL Renal: SCr < 1, BUN WNL LFTs / TGs: LFTs / Tbili / TG WNL Prealbumin / albumin: albumin 3, prealbumin 24.5, lipase up to 138 (1/5) Intake / Output; MIVF: LBM 1/6; MIVF: LR at 200 ml/hr GI Imaging: 1/6 CT: acute non-necrotizing pancreatitis, peripancreatic fluid collection and developing pseudocyst 1/8 MRCP - pancreatic cyst, mildly increased in size Surgeries / Procedures: n/a  Central access: PICC placed 08/23/20 TPN start date: chronic  Nutritional Goals (per RD rec on 09/27/20): 1950-2150 kCal, 105-120g protein, >1.9L fluid per day  TPN formula from [facility]:  [facility] over 18 hrs:  102g AA, 260g CHO and 40g ILE for a total of 1692 kCal per  day Lytes: increasing K needs, mag 66mEq/d  Current Nutrition:  TPN Advanced to FLD 1/7 - 0% of meals charted yesterday Boost PO TID - none charted yesterday  Plan:  Continue cyclic TPN, infuse 1920 ml over 18 hrs:  56 ml/hr on first and last hr, 113 ml/hr x 16 hrs TPN provides 106g AA, 384g CHO and 31g SMOF lipids, for a total of 2036 kCal, meeting 100% of needs Electrolytes: Na 6mEq/L, increase K to 72mEq/L, Ca 38mEq/L, increase Mag to 26mEq/L, Phos 3 mmol/L, Cl:Ac 1:1 Add MVI and trace elements in TPN Continue sensitive SSI Q8H and adjust as needed - d/c 1/10 if continues to not require insulin at goal TPN rate Mag sulfate 2g IV x 1 + KCl 12m PO x 1 already ordered per MD and given today Give K runs x 6 LR at 200 ml/hr per MD - monitor I/O's, f/u with MD for adjustments Monitor TPN labs   , PharmD, BCPS Please check AMION for all [facility] contact numbers Clinical Pharmacist 09/29/2020 9:21 AM

## 2020-09-30 LAB — COMPREHENSIVE METABOLIC PANEL
ALT: 27 U/L (ref 0–44)
AST: 17 U/L (ref 15–41)
Albumin: 3.4 g/dL — ABNORMAL LOW (ref 3.5–5.0)
Alkaline Phosphatase: 117 U/L (ref 38–126)
Anion gap: 7 (ref 5–15)
BUN: 12 mg/dL (ref 6–20)
CO2: 29 mmol/L (ref 22–32)
Calcium: 9.2 mg/dL (ref 8.9–10.3)
Chloride: 106 mmol/L (ref 98–111)
Creatinine, Ser: 0.63 mg/dL (ref 0.44–1.00)
GFR, Estimated: >60 mL/min (ref >60)
Glucose, Bld: 103 mg/dL — ABNORMAL HIGH (ref 70–99)
Potassium: 3.5 mmol/L (ref 3.5–5.1)
Sodium: 142 mmol/L (ref 135–145)
Total Bilirubin: 0.4 mg/dL (ref 0.3–1.2)
Total Protein: 6.2 g/dL — ABNORMAL LOW (ref 6.5–8.1)

## 2020-09-30 LAB — DIFFERENTIAL
Abs Immature Granulocytes: 0 10*3/uL (ref 0.00–0.07)
Basophils Absolute: 0.1 10*3/uL (ref 0.0–0.1)
Basophils Relative: 1 %
Eosinophils Absolute: 0.3 10*3/uL (ref 0.0–0.5)
Eosinophils Relative: 6 %
Immature Granulocytes: 0 %
Lymphocytes Relative: 31 %
Lymphs Abs: 1.7 10*3/uL (ref 0.7–4.0)
Monocytes Absolute: 0.5 10*3/uL (ref 0.1–1.0)
Monocytes Relative: 10 %
Neutro Abs: 3 10*3/uL (ref 1.7–7.7)
Neutrophils Relative %: 52 %

## 2020-09-30 LAB — MAGNESIUM: Magnesium: 1.7 mg/dL (ref 1.7–2.4)

## 2020-09-30 LAB — CBC
HCT: 35.2 % — ABNORMAL LOW (ref 36.0–46.0)
Hemoglobin: 12 g/dL (ref 12.0–15.0)
MCH: 31.3 pg (ref 26.0–34.0)
MCHC: 34.1 g/dL (ref 30.0–36.0)
MCV: 91.7 fL (ref 80.0–100.0)
Platelets: 249 10*3/uL (ref 150–400)
RBC: 3.84 MIL/uL — ABNORMAL LOW (ref 3.87–5.11)
RDW: 14.1 % (ref 11.5–15.5)
WBC: 5.6 10*3/uL (ref 4.0–10.5)
nRBC: 0 % (ref 0.0–0.2)

## 2020-09-30 LAB — GLUCOSE, CAPILLARY
Glucose-Capillary: 81 mg/dL (ref 70–99)
Glucose-Capillary: 97 mg/dL (ref 70–99)
Glucose-Capillary: 63 mg/dL — ABNORMAL LOW (ref 70–99)

## 2020-09-30 LAB — PREALBUMIN: Prealbumin: 28.3 mg/dL (ref 18–38)

## 2020-09-30 LAB — TRIGLYCERIDES: Triglycerides: 171 mg/dL — ABNORMAL HIGH (ref <150)

## 2020-09-30 LAB — PHOSPHORUS: Phosphorus: 3.5 mg/dL (ref 2.5–4.6)

## 2020-09-30 MED ORDER — COVID-19 MRNA VACC (MODERNA) 100 MCG/0.5ML IM SUSP
0.5000 mL | Freq: Once | INTRAMUSCULAR | Status: AC
  Administered 2020-10-01: 0.5 mL via INTRAMUSCULAR
  Filled 2020-09-30: qty 0.5

## 2020-09-30 MED ORDER — POTASSIUM CHLORIDE 10 MEQ/50ML IV SOLN
10.0000 meq | INTRAVENOUS | Status: AC
  Administered 2020-09-30 (×3): 10 meq via INTRAVENOUS
  Filled 2020-09-30 (×4): qty 50

## 2020-09-30 MED ORDER — TRACE MINERALS CU-MN-SE-ZN 300-55-60-3000 MCG/ML IV SOLN
INTRAVENOUS | Status: AC
  Filled 2020-09-30: qty 704

## 2020-09-30 MED ORDER — MAGNESIUM SULFATE 2 GM/50ML IV SOLN
2.0000 g | Freq: Once | INTRAVENOUS | Status: AC
  Administered 2020-09-30: 2 g via INTRAVENOUS
  Filled 2020-09-30: qty 50

## 2020-09-30 NOTE — Progress Notes (Signed)
Patient with continued abdominal pain and MRI with enlarging cyst in pancreatic tail, now 7.0 x 2.4 cm. Unfortunately, no current availability for cyst drainage and/or necrosectomy and/or stent placement. Called [facility] to discuss possible transfer, but they stated facilities are at capacity and patient cannot be wait listed.    Called patient to discuss potential for transfer to [facility] for a day trip for procedure with transfer back to  [facility] same day. Patient amenable to this plan.   Discussed that pain may or may not be related to cyst vs underlying pancreatitis. Discussed option of advanced procedures to include stenting which may or may not alleviate pain and/or symptoms. Risks include worsening pancreatitis, infection, perforation, or development of additional pseudocysts. Patient verbalized understanding and gave verbal consent to proceed with transfe r and EUS.   Plan for day trip tomorrow 1/11 pending transportation.    NPO after midnight.   If transport unable to be arranged, we may have to postpone until Thursday.

## 2020-09-30 NOTE — Plan of Care (Signed)

## 2020-09-30 NOTE — Progress Notes (Signed)
[facility] Gastroenterology Progress Note  Tricia Brown 45 y.o. Feb 18, 1976   Subjective: Nausea without vomiting. Complaining of ongoing abdominal pain. Denies BMs overnight. On TNA  Objective: Vital signs: Vitals:   09/29/20 1945 09/30/20 0407  BP: (!) 144/79 (!) 136/93  Pulse: 71 73  Resp: 17 17  Temp: 98.6 F (37 C) 98.3 F (36.8 C)  SpO2: 100% 100%    Physical Exam: Gen: lethargic, thin, no acute distress  HEENT: anicteric sclera CV: RRR Chest: CTA B Abd: diffuse tenderness (greatest in LUQ) with guarding, soft, nondistended, +BS Ext: no edema  Lab Results: Recent Labs    09/29/20 0323 09/30/20 0352  NA 142 142  K 2.9* 3.5  CL 106 106  CO2 29 29  GLUCOSE 118* 103*  BUN 10 12  CREATININE 0.60 0.63  CALCIUM 8.8* 9.2  MG 1.7 1.7  PHOS 4.0 3.5   Recent Labs    09/29/20 0323 09/30/20 0352  AST 21 17  ALT 31 27  ALKPHOS 109 117  BILITOT 0.2* 0.4  PROT 5.8* 6.2*  ALBUMIN 3.0* 3.4*   Recent Labs    09/28/20 0346 09/30/20 0352  WBC 5.2 5.6  NEUTROABS 2.5 3.0  HGB 10.6* 12.0  HCT 32.6* 35.2*  MCV 92.4 91.7  PLT 229 249      Assessment/Plan: Acute complicated pancreatitis with previous necrosis and worsening pseudocysts. MRCP shows enlarging pancreatic tail cyst (7 cm X 2.4 cm). Continue supportive care. Will ask Dr. Meridee Score to review MRCP and see if pseudocyst drainage is possible and if not then may need to refer to a tertiary care center for further management.   Tricia Brown 09/30/2020, 9:23 AM  Questions please call [phone] ID: Tricia Brown, female   DOB: 07-13-1976, 45 y.o.   MRN: [id number]

## 2020-09-30 NOTE — Progress Notes (Signed)
PHARMACY - TOTAL PARENTERAL NUTRITION CONSULT NOTE  Indication: Complicated pancreatitis with loculated pseudocysts  Patient Measurements: Height: 5\' 7"  (170.2 cm) Weight: 58.4 kg (128 lb 12 oz) IBW/kg (Calculated) : 61.6 TPN AdjBW (KG): 58.4 Body mass index is 20.16 kg/m.  Assessment:  5 YOF recently admitted 10/20-11/17 with necrotizing pancreatitis with infected pseudocyst s/p cystgastrostomy and then stent removal.  Patient readmitted 11/25-12/8/21 with abdominal pain and progressive phlegmon in the LUQ.  Returned 09/26/20 with worsening abdominal pain and decreased PO intake.  Pharmacy consulted to continue TPN from PTA for recurrent pancreatitis.   Patient reports weighing ~150lbs early 2021, then lost weight with pancreatitis and was as low as 125 lb.  Since TPN started late 2021, she gained back some weight and has been maintaining at 135 lbs.  Patient remains on an 18-hr cycle TPN since discharge in Dec and has been on a liquid diet.  Any solid food would cause abdominal pain.  She missed 2 doses of TPN while holding in the ED.  Glucose / Insulin: no hx DM - CBGs low normal.  Utilized 0 unit SSI in last 24hrs Electrolytes: K 3.5 post 6 runs and Jan PO, Mag 1.7 post 2gm, others WNL Renal: SCr < 1, BUN WNL LFTs / TGs: LFTs / tbili WNL, TG 69 > 171 Prealbumin / albumin: albumin up to 3.4, prealbumin WNL at 28.3, lipase up to 138 (1/5) Intake / Output; MIVF: LBM 1/6, LR at 200 ml/hr, no I/O's recorded GI Imaging:  1/6 CT: acute non-necrotizing pancreatitis, peripancreatic fluid collection and developing pseudocyst 1/8 MRCP - pancreatic cyst, mildly increased in size Surgeries / Procedures: N/A  Central access: PICC placed 08/23/20 TPN start date: chronic  Nutritional Goals (per RD rec on 1/7): 1950-2150 kCal, 105-120g protein, >1.9L fluid per day  TPN formula from [facility]:  [facility] over 18 hrs:  102g AA, 260g CHO and 40g ILE for a total of 1692 kCal per day Lytes: increasing  K needs, mag 32mEq/d  Current Nutrition:  TPN Soft diet - ate primarily vegetables, able to keep food down per patient Boost PO TID - 1 charted given yesterday  Plan:  Continue cyclic TPN, infuse 1920 ml over 18 hrs:  56 ml/hr on first and last hr, 113 ml/hr x 16 hrs TPN provides 106g AA, 384g CHO and 31g ILE for a total of 2036 kCal, meeting 100% of needs Electrolytes: Na 5mEq/L, increase K to 4mEq/L, Ca 12mEq/L, increase Mag to 55mEq/L, incr Phos to 60mmol/L, Cl:Ac 1:1 Add MVI and trace elements in TPN  D/C SSI - continue Q8H CBG checks to monitor for hypoglycemia Reduce LR to 100 ml/hr per discussion with MD KCL x 4 runs Mag sulfate 2gm IV x1 F/u AM labs, recheck TG on Thurs  Jigar Zielke D. 10-28-1982, PharmD, BCPS, BCCCP 09/30/2020, 9:53 AM

## 2020-09-30 NOTE — Progress Notes (Signed)
PROGRESS NOTE    Tricia Brown  WGN:[id number] DOB: 01-Jan-1976 DOA: 09/25/2020 PCP: [name], [name], MD   Brief Narrative:  HPI: Tricia Brown is a 45 y.o. female with medical history significant of chronic pain, on methadone; and depression presenting with worsening abdominal pain.  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  She was most recently treated with Meropenem and MS Contin with breakthrough Oxy IR.  She was discharged home with TPN.  Since d/c, she has been taking TPN IV x 18 hours a day.  She is also on Carafate QID, Protonix once daily, Zofran TID, Bentyl, and pancreatic enzymes BID.  She is also on binder BID to help with diarrhea.  She started with temp to 99 -> 100 over the last few days.  She was no longer able to keep ginger ale and broth down.  She was having trouble keeping her pills down too.  They are trying to get her referred to [facility].  The pain has been gradually worsening for about a week.  Her pain medications are not keeping the pain at bay.  She finished her antibiotic during her last hospitalization.    ED Course:  Waiting to hear back from GI. Complex history with recent necrotizing pancreatitis.  Back with recurrent symptoms - pain, n/v/d, only drinks broth and on TPN.  Febrile to 101 at home.  PICC line draining.  CT with acute non-necrotizing pancreatitis with peripancreatitis fluid collection.  Defer admission for now until GI calls back with plan.  Assessment & Plan:   Principal Problem:   Acute pancreatitis Active Problems:   Depression   Opioid use disorder, severe, dependence (HCC)   Acute/recurrent pancreatitis/chronic pain -Patient with severe necrotizing pancreatitis, with 2 prior prolonged hospitalizations including cystogastrostomy with stent placement and then removal -Now presenting with recurrent pain, n/v.  Seen by GI. They are considering for another stent.  MRCP shows enlarging cyst at the tail of the pancreas.  Dr. Meridee ScoreMansouraty to see patient.  Diet has been advanced to soft.  She is still having significant symptoms of abdominal pain and nausea.  Management per GI.  Hypokalemia/hypomagnesemia: Resolved but both at borderline normal.  COPD: Not in exacerbation. -Continue Symbicort  DVT prophylaxis: enoxaparin (LOVENOX) injection 40 mg Start: 09/26/20 1500   Code Status: Full Code  Family Communication: None present at bedside.  Plan of care discussed with patient in length and he verbalized understanding and agreed with it.  Status is: Inpatient  Remains inpatient appropriate because:Inpatient level of care appropriate due to severity of illness   Dispo: The patient is from: Home              Anticipated d/c is to: Home              Anticipated d/c date is: 2 days              Patient currently is not medically stable to d/c.        Estimated body mass index is 21.14 kg/m as calculated from the following:   Height as of 08/15/20: 5\' 7"  (1.702 m).   Weight as of this encounter: 61.2 kg.      Nutritional status:  Nutrition Problem: Increased nutrient needs Etiology: chronic illness (pancreatitis)   Signs/Symptoms: estimated needs   Interventions: Boost Breeze,TPN    Consultants:   GI  Procedures:   None  Antimicrobials:  Anti-infectives (From admission,  onward)   None         Subjective: Seen and examined.  Continues to complain of severe abdominal pain 7 out of 10.  Almost persistent nausea but no vomiting.  Objective: Vitals:   09/29/20 0448 09/29/20 1254 09/29/20 1945 09/30/20 0407  BP: 138/85 (!) 145/61 (!) 144/79 (!) 136/93  Pulse: (!) 59 74 71 73  Resp: 17 18 17 17   Temp: 98.3 F (36.8 C) 97.9 F (36.6 C) 98.6 F (37 C) 98.3 F (36.8 C)  TempSrc: Oral Oral Oral Oral  SpO2: 100% 100% 100% 100%  Weight:        Intake/Output Summary  (Last 24 hours) at 09/30/2020 1346 Last data filed at 09/30/2020 0305 Gross per 24 hour  Intake 3542.92 ml  Output --  Net 3542.92 ml   Filed Weights   09/27/20 0900  Weight: 61.2 kg    Examination:  General exam: Appears calm and comfortable  Respiratory system: Clear to auscultation. Respiratory effort normal. Cardiovascular system: S1 & S2 heard, RRR. No JVD, murmurs, rubs, gallops or clicks. No pedal edema. Gastrointestinal system: Abdomen is nondistended, soft and tender at upper abdomen, much improved than yesterday. No organomegaly or masses felt. Normal bowel sounds heard. Central nervous system: Alert and oriented. No focal neurological deficits. Extremities: Symmetric 5 x 5 power. Skin: No rashes, lesions or ulcers.  Psychiatry: Judgement and insight appear normal. Mood & affect appropriate.   Data Reviewed: I have personally reviewed following labs and imaging studies  CBC: Recent Labs  Lab 09/25/20 1757 09/27/20 0507 09/28/20 0346 09/30/20 0352  WBC 10.5 5.7 5.2 5.6  NEUTROABS  --   --  2.5 3.0  HGB 12.6 10.6* 10.6* 12.0  HCT 38.7 31.5* 32.6* 35.2*  MCV 91.5 92.1 92.4 91.7  PLT 298 212 229 249   Basic Metabolic Panel: Recent Labs  Lab 09/25/20 1757 09/26/20 0803 09/27/20 0507 09/28/20 0346 09/29/20 0323 09/30/20 0352  NA 141  --  140 142 142 142  K 2.6*  --  3.2* 3.3* 2.9* 3.5  CL 106  --  109 107 106 106  CO2 23  --  22 25 29 29   GLUCOSE 87  --  66* 132* 118* 103*  BUN 19  --  18 13 10 12   CREATININE 0.70  --  0.80 0.63 0.60 0.63  CALCIUM 9.5  --  8.9 8.4* 8.8* 9.2  MG  --  1.6* 1.4* 1.5* 1.7 1.7  PHOS  --   --  5.3* 3.9 4.0 3.5   GFR: Estimated Creatinine Clearance: 86.7 mL/min (by C-G formula based on SCr of 0.63 mg/dL). Liver Function Tests: Recent Labs  Lab 09/25/20 1757 09/27/20 0507 09/28/20 0346 09/29/20 0323 09/30/20 0352  AST 20 19 19 21 17   ALT 38 26 25 31 27   ALKPHOS 111 102 109 109 117  BILITOT 0.6 0.9 0.5 0.2* 0.4  PROT  6.9 5.5* 5.5* 5.8* 6.2*  ALBUMIN 3.8 2.9* 3.0* 3.0* 3.4*   Recent Labs  Lab 09/25/20 1757  LIPASE 138*   No results for input(s): AMMONIA in the last 168 hours. Coagulation Profile: No results for input(s): INR, PROTIME in the last 168 hours. Cardiac Enzymes: No results for input(s): CKTOTAL, CKMB, CKMBINDEX, TROPONINI in the last 168 hours. BNP (last 3 results) No results for input(s): PROBNP in the last 8760 hours. HbA1C: No results for input(s): HGBA1C in the last 72 hours. CBG: Recent Labs  Lab 09/29/20 0445 09/29/20 1248 09/29/20 1946 09/30/20  0405 09/30/20 1133  GLUCAP 109* 104* 71 81 97   Lipid Profile: Recent Labs    09/28/20 0346 09/30/20 0352  TRIG 69 171*   Thyroid Function Tests: No results for input(s): TSH, T4TOTAL, FREET4, T3FREE, THYROIDAB in the last 72 hours. Anemia Panel: No results for input(s): VITAMINB12, FOLATE, FERRITIN, TIBC, IRON, RETICCTPCT in the last 72 hours. Sepsis Labs: No results for input(s): PROCALCITON, LATICACIDVEN in the last 168 hours.  Recent Results (from the past 240 hour(s))  Resp Panel by RT-PCR (Flu A&B, Covid) Nasopharyngeal Swab     Status: None   Collection Time: 09/26/20 12:52 PM   Specimen: Nasopharyngeal Swab; Nasopharyngeal(NP) swabs in vial transport medium  Result Value Ref Range Status   SARS Coronavirus 2 by RT PCR NEGATIVE NEGATIVE Final    Comment: (NOTE) SARS-CoV-2 target nucleic acids are NOT DETECTED.  The SARS-CoV-2 RNA is generally detectable in upper respiratory specimens during the acute phase of infection. The lowest concentration of SARS-CoV-2 viral copies this assay can detect is 138 copies/mL. A negative result does not preclude SARS-Cov-2 infection and should not be used as the sole basis for treatment or other patient management decisions. A negative result may occur with  improper specimen collection/handling, submission of specimen other than nasopharyngeal swab, presence of viral  mutation(s) within the areas targeted by this assay, and inadequate number of viral copies(<138 copies/mL). A negative result must be combined with clinical observations, patient history, and epidemiological information. The expected result is Negative.  Fact Sheet for Patients:  BloggerCourse.com  Fact Sheet for Healthcare Providers:  SeriousBroker.it  This test is no t yet approved or cleared by the [place] FDA and  has been authorized for detection and/or diagnosis of SARS-CoV-2 by FDA under an Emergency Use Authorization (EUA). This EUA will remain  in effect (meaning this test can be used) for the duration of the COVID-19 declaration under Section 564(b)(1) of the Act, 21 U.S.C.section 360bbb-3(b)(1), unless the authorization is terminated  or revoked sooner.       Influenza A by PCR NEGATIVE NEGATIVE Final   Influenza B by PCR NEGATIVE NEGATIVE Final    Comment: (NOTE) The Xpert Xpress SARS-CoV-2/FLU/RSV plus assay is intended as an aid in the diagnosis of influenza from Nasopharyngeal swab specimens and should not be used as a sole basis for treatment. Nasal washings and aspirates are unacceptable for Xpert Xpress SARS-CoV-2/FLU/RSV testing.  Fact Sheet for Patients: BloggerCourse.com  Fact Sheet for Healthcare Providers: SeriousBroker.it  This test is not yet approved or cleared by the [place] FDA and has been authorized for detection and/or diagnosis of SARS-CoV-2 by FDA under an Emergency Use Authorization (EUA). This EUA will remain in effect (meaning this test can be used) for the duration of the COVID-19 declaration under Section 564(b)(1) of the Act, 21 U.S.C. section 360bbb-3(b)(1), unless the authorization is terminated or revoked.  Performed at [facility], [address], [place], [place] [address]       Radiology Studies: MR  ABDOMEN MRCP W WO CONTAST  Result Date: 09/29/2020 CLINICAL DATA:  Acute on chronic pancreatitis. Peripancreatic fluid collection. EXAM: MRI ABDOMEN WITHOUT AND WITH CONTRAST (INCLUDING MRCP) TECHNIQUE: Multiplanar multisequence MR imaging of the abdomen was performed both before and after the administration of intravenous contrast. Heavily T2-weighted images of the biliary and pancreatic ducts were obtained, and three-dimensional MRCP images were rendered by post processing. CONTRAST:  62mL GADAVIST GADOBUTROL 1 MMOL/ML IV SOLN COMPARISON:  CT on 09/26/2020 FINDINGS:  Lower chest: No acute findings. Hepatobiliary: No hepatic masses identified. Prior cholecystectomy. No evidence of biliary ductal dilatation. Pancreas: No evidence of pancreatic edema or acute peripancreatic inflammatory changes. No evidence of pancreatic mass or necrosis. No evidence of pancreatic ductal dilatation. A well circumscribed fluid collection with thin peripheral rim enhancement is seen along the dorsal aspect of the pancreatic tail, which measures 7.0 by 2.4 cm. This has mildly increased in size from 5.6 x 1.8 cm previously, and is consistent with a pancreatic cyst. No other peripancreatic fluid collections are seen. Spleen:  Within normal limits in size and appearance. Adrenals/Urinary Tract: No masses identified. No evidence of hydronephrosis. Stomach/Bowel: Visualized portion unremarkable. Vascular/Lymphatic: No pathologically enlarged lymph nodes identified. No abdominal aortic aneurysm. Other:  None. Musculoskeletal:  No suspicious bone lesions identified. IMPRESSION: Pancreatic cyst along the dorsal aspect of the pancreatic tail, mildly increased in size since previous study. No other complication identified. Prior cholecystectomy. No evidence of biliary ductal dilatation. Electronically Signed   By: Danae Orleans M.D.   On: 09/29/2020 08:44    Scheduled Meds: . Chlorhexidine Gluconate Cloth  6 each Topical Daily  . colestipol  1  g Oral BID  . enoxaparin (LOVENOX) injection  40 mg Subcutaneous Q24H  . feeding supplement  1 Container Oral TID BM  . lipase/protease/amylase  72,000 Units Oral TID WC  . montelukast  10 mg Oral Daily  . pantoprazole (PROTONIX) IV  40 mg Intravenous Q12H  . sodium chloride flush  10-40 mL Intracatheter Q12H  . sucralfate  1 g Oral QID   Continuous Infusions: . lactated ringers 100 mL/hr at 09/30/20 1150  . magnesium sulfate bolus IVPB    . potassium chloride    . TPN CYCLIC-ADULT (ION)       LOS: 4 days   Time spent: 25 minutes   Hughie Closs, MD [facility]  09/30/2020, 1:46 PM   To contact the attending provider between 7A-7P or the covering provider during after hours 7P-7A, please log into the web site [email or web address].

## 2020-10-01 LAB — BASIC METABOLIC PANEL
Anion gap: 8 (ref 5–15)
BUN: 15 mg/dL (ref 6–20)
CO2: 28 mmol/L (ref 22–32)
Calcium: 9.1 mg/dL (ref 8.9–10.3)
Chloride: 103 mmol/L (ref 98–111)
Creatinine, Ser: 0.64 mg/dL (ref 0.44–1.00)
GFR, Estimated: >60 mL/min (ref >60)
Glucose, Bld: 104 mg/dL — ABNORMAL HIGH (ref 70–99)
Potassium: 3.9 mmol/L (ref 3.5–5.1)
Sodium: 139 mmol/L (ref 135–145)

## 2020-10-01 LAB — MAGNESIUM: Magnesium: 2 mg/dL (ref 1.7–2.4)

## 2020-10-01 LAB — GLUCOSE, CAPILLARY
Glucose-Capillary: 109 mg/dL — ABNORMAL HIGH (ref 70–99)
Glucose-Capillary: 90 mg/dL (ref 70–99)
Glucose-Capillary: 91 mg/dL (ref 70–99)

## 2020-10-01 LAB — PHOSPHORUS: Phosphorus: 4.8 mg/dL — ABNORMAL HIGH (ref 2.5–4.6)

## 2020-10-01 MED ORDER — HYDROMORPHONE HCL 1 MG/ML IJ SOLN
1.0000 mg | INTRAMUSCULAR | Status: DC | PRN
  Administered 2020-10-01 – 2020-10-07 (×25): 1 mg via INTRAVENOUS
  Filled 2020-10-01 (×25): qty 1

## 2020-10-01 MED ORDER — OXYCODONE HCL 5 MG PO TABS
15.0000 mg | ORAL_TABLET | Freq: Four times a day (QID) | ORAL | Status: DC | PRN
  Administered 2020-10-01 – 2020-10-07 (×20): 15 mg via ORAL
  Filled 2020-10-01 (×20): qty 3

## 2020-10-01 MED ORDER — TRACE MINERALS CU-MN-SE-ZN 300-55-60-3000 MCG/ML IV SOLN
INTRAVENOUS | Status: AC
  Filled 2020-10-01: qty 704

## 2020-10-01 NOTE — Progress Notes (Signed)
Discussed with Shanon Brow, assistant director of [facility].  She has tried to arrange transport to [facility] (GI procedures) for advanced endoscopic procedure (EUS with cyst drainage +/- stenting); however, she was told by [facility] that they do not have adequate staffing to transport patient round trip.  [facility] is unable to take patient to [facility], as they reportedly do not provide transport from acute care facility to acute care facility.  Patient is not medically stable for discharge with arrangement for outpatient procedure.  Thus, GI procedure scheduled for 1/13 at [facility] has been cancelled due to lack of transportation services from [facility] to [place] and back to [employer].  I have messaged Dr. Meridee Score ([facility]) to see if he has availability to complete this advanced endoscopic procedure next week, as he did not have availability to complete the procedure this week.    Edrick Kins, PA-C  [facility] [phone]

## 2020-10-01 NOTE — Progress Notes (Signed)
Nutrition Follow-up  DOCUMENTATION CODES:   Not applicable  INTERVENTION:   -TPN management per pharmacy -Continue Boost Breeze po TID, each supplement provides 250 kcal and 9 grams of protein  NUTRITION DIAGNOSIS:   Increased nutrient needs related to chronic illness (pancreatitis) as evidenced by estimated needs.  Ongoing  GOAL:   Patient will meet greater than or equal to 90% of their needs  Met with TPN  MONITOR:   PO intake,Supplement acceptance,Diet advancement,Labs,Weight trends,Skin,I & O's  REASON FOR ASSESSMENT:   Consult New TPN/TNA  ASSESSMENT:   Tricia Brown is a 45 y.o. female with medical history significant of chronic pain, on methadone; and depression presenting with worsening abdominal pain.  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  She was most recently treated with Meropenem and MS Contin with breakthrough Oxy IR.  She was discharged home with TPN.  Since d/c, she has been taking TPN IV x 18 hours a day.  She is also on Carafate QID, Protonix once daily, Zofran TID, Bentyl, and pancreatic enzymes BID.  She is also on binder BID to help with diarrhea.  She started with temp to 99 -> 100 over the last few days.  She was no longer able to keep ginger ale and broth down.  She was having trouble keeping her pills down too.  They are trying to get her referred to [facility].  The pain has been gradually worsening for about a week.  Her pain medications are not keeping the pain at [place].  She finished her antibiotic during her last hospitalization.  1/10- MRI revealed enlarging cysts in pancreatic tail  Reviewed I/O's: +5.6 L x 24 hours and +23 L since admission  Per GI notes, plan for cyst drainage and/or necrosectomy and/or stent placement on Thursday, 10/03/20 at [facility]. Pt awaiting confirmed transport to procedure.   Spoke with pt and  dad at bedside. Pt pleasant and in good spirits today. She reports that she was doing well since discharge earlier last month. She confirms that she was discharged home on TPN and consuming a clear liquid diet at home. Per her report, pt was able to resume regular activity at home, including walking her dogs. She has been compliant with her TPN and had no issues with it. Of note, pt refused replacement on post-pyloric tube on last admission, hence TPN was started due to ongoing poor tolerance of oral intake.   Although pt is on a soft diet, she reports ordering mostly clear liquids due to abdominal pain and fear of poor tolerance. Lunch tray of broth delivered at time of visit. Per pt, this is the first time she has eaten today. She is also consuming Boost Breeze supplements without difficulty.   Pt confirms wt has been stable since last admission, holding steady at 135#.   Pt remains on TPN. Per pharmacy note, pt remains on cyclic TPN (7858 ml over 20 hrs: 51 ml/hr on first and last hr, 101 ml/hr x 18 hrs). Regimen provides 2036 kcals and 106 grams protein, meeting 100% of estimated kcal and protein needs).   Medications reviewed and include lactated ringers infusion @ 100 ml/hr.   Labs reviewed: Phos: 4.8, CBGS: 63-109.   NUTRITION - FOCUSED PHYSICAL EXAM:  Flowsheet Row Most Recent Value  Upper Arm Region No depletion  Thoracic and Lumbar Region No depletion  Buccal Region No depletion  Temple Region No depletion  Clavicle  Bone Region Mild depletion  Clavicle and Acromion Bone Region No depletion  Scapular Bone Region No depletion  Dorsal Hand No depletion  Patellar Region No depletion  Anterior Thigh Region No depletion  Posterior Calf Region No depletion  Edema (RD Assessment) None  Hair Reviewed  Eyes Reviewed  Mouth Reviewed  Skin Reviewed  Nails Reviewed       Diet Order:   Diet Order            DIET SOFT Room service appropriate? Yes; Fluid consistency: Thin  Diet  effective now                 EDUCATION NEEDS:   No education needs have been identified at this time  Skin:  Skin Assessment: Reviewed RN Assessment  Last BM:  09/26/20  Height:   Ht Readings from Last 1 Encounters:  08/15/20 $RemoveB'5\' 7"'sNplVVue$  (1.702 m)    Weight:   Wt Readings from Last 1 Encounters:  09/27/20 61.2 kg    Ideal Body Weight:  61.4 kg  BMI:  Body mass index is 21.14 kg/m.  Estimated Nutritional Needs:   Kcal:  1950-2150  Protein:  105-120 grams  Fluid:  > 1.9 L    Loistine Chance, RD, LDN, [place] Registered Dietitian II Certified Diabetes Care and Education Specialist Please refer to [facility] for RD and/or RD on-call/weekend/after hours pager

## 2020-10-01 NOTE — Progress Notes (Signed)
PROGRESS NOTE    Tricia Brown  RDE:[id number] DOB: 1975-12-21 DOA: 09/25/2020 PCP: Tricia Croon, MD   Brief Narrative:  HPI: Tricia Brown is a 45 y.o. female with medical history significant of chronic pain, on methadone; and depression presenting with worsening abdominal pain.  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  She was most recently treated with Meropenem and MS Contin with breakthrough Oxy IR.  She was discharged home with TPN.  Since d/c, she has been taking TPN IV x 18 hours a day.  She is also on Carafate QID, Protonix once daily, Zofran TID, Bentyl, and pancreatic enzymes BID.  She is also on binder BID to help with diarrhea.  She started with temp to 99 -> 100 over the last few days.  She was no longer able to keep ginger ale and broth down.  She was having trouble keeping her pills down too.  They are trying to get her referred to [facility].  The pain has been gradually worsening for about a week.  Her pain medications are not keeping the pain at bay.  She finished her antibiotic during her last hospitalization.    ED Course:  Waiting to hear back from GI. Complex history with recent necrotizing pancreatitis.  Back with recurrent symptoms - pain, n/v/d, only drinks broth and on TPN.  Febrile to 101 at home.  PICC line draining.  CT with acute non-necrotizing pancreatitis with peripancreatitis fluid collection.  Defer admission for now until GI calls back with plan.  Assessment & Plan:   Principal Problem:   Acute pancreatitis Active Problems:   Depression   Opioid use disorder, severe, dependence (HCC)   Acute/recurrent pancreatitis/chronic pain -Patient with severe necrotizing pancreatitis, with 2 prior prolonged hospitalizations including cystogastrostomy with stent placement and then removal -Now presentied with recurrent pain, n/v.  Seen by GI. They are considering for another stent.  MRCP shows enlarging cyst at the tail of the pancreas.  Reportedly, GI service does not have any capacity to place pancreatic stent on this lady for the whole week so they arranged this procedure to be done for the patient at [facility] today at 8 AM however [facility] requires transportation back and forth and per the report received from nursing, both [facility] and [facility] are unable to wait for the patient to finish procedure for 2 and half hours.  Nursing is working on finding a solution to this.  She is rescheduled for the procedure at 9 AM on 10/03/2020.  Patient tells me that she still has pain and she does not feel like her pain is very well controlled.  She is on Dilaudid 0.5 mg every 3 hours.  We will increase it to 1 mg every 4 hours as needed.   Hypokalemia/hypomagnesemia: Resolved  COPD: Not in exacerbation. -Continue Symbicort  DVT prophylaxis: enoxaparin (LOVENOX) injection 40 mg Start: 09/26/20 1500   Code Status: Full Code  Family Communication: None present at bedside.  Plan of care discussed with patient in length and he verbalized understanding and agreed with it.  Status is: Inpatient  Remains inpatient appropriate because:Inpatient level of care appropriate due to severity of illness   Dispo: The patient is from: Home              Anticipated d/c is to: Home              Anticipated d/c date is: 2  days              Patient currently is not medically stable to d/c.        Estimated body mass index is 21.14 kg/m as calculated from the following:   Height as of 08/15/20: 5\' 7"  (1.702 m).   Weight as of this encounter: 61.2 kg.      Nutritional status:  Nutrition Problem: Increased nutrient needs Etiology: chronic illness (pancreatitis)   Signs/Symptoms: estimated needs   Interventions: Boost Breeze,TPN    Consultants:   GI  Procedures:   None  Antimicrobials:  Anti-infectives (From admission,  onward)   None         Subjective: Seen and examined.  Still complains of same pain 7 out of 10 with persistent nausea and GERD symptoms but no vomiting.  No other complaint.  Objective: Vitals:   09/30/20 0407 09/30/20 1455 09/30/20 2023 10/01/20 0452  BP: (!) 136/93 (!) 149/77 (!) 146/93 131/77  Pulse: 73 81 73 68  Resp: 17  20 15   Temp: 98.3 F (36.8 C) 98.1 F (36.7 C) 98.2 F (36.8 C) (!) 97.5 F (36.4 C)  TempSrc: Oral Oral  Oral  SpO2: 100% 100% 100% 100%  Weight:        Intake/Output Summary (Last 24 hours) at 10/01/2020 0957 Last data filed at 10/01/2020 0700 Gross per 24 hour  Intake 5576.53 ml  Output -  Net 5576.53 ml   Filed Weights   09/27/20 0900  Weight: 61.2 kg    Examination:  General exam: Appears calm and comfortable  Respiratory system: Clear to auscultation. Respiratory effort normal. Cardiovascular system: S1 & S2 heard, RRR. No JVD, murmurs, rubs, gallops or clicks. No pedal edema. Gastrointestinal system: Abdomen is nondistended, soft and upper abdominal tenderness. No organomegaly or masses felt. Normal bowel sounds heard. Central nervous system: Alert and oriented. No focal neurological deficits. Extremities: Symmetric 5 x 5 power. Skin: No rashes, lesions or ulcers.  Psychiatry: Judgement and insight appear normal. Mood & affect appropriate.    Data Reviewed: I have personally reviewed following labs and imaging studies  CBC: Recent Labs  Lab 09/25/20 1757 09/27/20 0507 09/28/20 0346 09/30/20 0352  WBC 10.5 5.7 5.2 5.6  NEUTROABS  --   --  2.5 3.0  HGB 12.6 10.6* 10.6* 12.0  HCT 38.7 31.5* 32.6* 35.2*  MCV 91.5 92.1 92.4 91.7  PLT 298 212 229 249   Basic Metabolic Panel: Recent Labs  Lab 09/27/20 0507 09/28/20 0346 09/29/20 0323 09/30/20 0352 10/01/20 0415  NA 140 142 142 142 139  K 3.2* 3.3* 2.9* 3.5 3.9  CL 109 107 106 106 103  CO2 22 25 29 29 28   GLUCOSE 66* 132* 118* 103* 104*  BUN 18 13 10 12 15   CREATININE  0.80 0.63 0.60 0.63 0.64  CALCIUM 8.9 8.4* 8.8* 9.2 9.1  MG 1.4* 1.5* 1.7 1.7 2.0  PHOS 5.3* 3.9 4.0 3.5 4.8*   GFR: Estimated Creatinine Clearance: 86.7 mL/min (by C-G formula based on SCr of 0.64 mg/dL). Liver Function Tests: Recent Labs  Lab 09/25/20 1757 09/27/20 0507 09/28/20 0346 09/29/20 0323 09/30/20 0352  AST 20 19 19 21 17   ALT 38 26 25 31 27   ALKPHOS 111 102 109 109 117  BILITOT 0.6 0.9 0.5 0.2* 0.4  PROT 6.9 5.5* 5.5* 5.8* 6.2*  ALBUMIN 3.8 2.9* 3.0* 3.0* 3.4*   Recent Labs  Lab 09/25/20 1757  LIPASE 138*   No results for input(s):  AMMONIA in the last 168 hours. Coagulation Profile: No results for input(s): INR, PROTIME in the last 168 hours. Cardiac Enzymes: No results for input(s): CKTOTAL, CKMB, CKMBINDEX, TROPONINI in the last 168 hours. BNP (last 3 results) No results for input(s): PROBNP in the last 8760 hours. HbA1C: No results for input(s): HGBA1C in the last 72 hours. CBG: Recent Labs  Lab 09/30/20 0405 09/30/20 1133 09/30/20 1625 10/01/20 0015 10/01/20 0700  GLUCAP 81 97 63* 109* 90   Lipid Profile: Recent Labs    09/30/20 0352  TRIG 171*   Thyroid Function Tests: No results for input(s): TSH, T4TOTAL, FREET4, T3FREE, THYROIDAB in the last 72 hours. Anemia Panel: No results for input(s): VITAMINB12, FOLATE, FERRITIN, TIBC, IRON, RETICCTPCT in the last 72 hours. Sepsis Labs: No results for input(s): PROCALCITON, LATICACIDVEN in the last 168 hours.  Recent Results (from the past 240 hour(s))  Resp Panel by RT-PCR (Flu A&B, Covid) Nasopharyngeal Swab     Status: None   Collection Time: 09/26/20 12:52 PM   Specimen: Nasopharyngeal Swab; Nasopharyngeal(NP) swabs in vial transport medium  Result Value Ref Range Status   SARS Coronavirus 2 by RT PCR NEGATIVE NEGATIVE Final    Comment: (NOTE) SARS-CoV-2 target nucleic acids are NOT DETECTED.  The SARS-CoV-2 RNA is generally detectable in upper respiratory specimens during the acute  phase of infection. The lowest concentration of SARS-CoV-2 viral copies this assay can detect is 138 copies/mL. A negative result does not preclude SARS-Cov-2 infection and should not be used as the sole basis for treatment or other patient management decisions. A negative result may occur with  improper specimen collection/handling, submission of specimen other than nasopharyngeal swab, presence of viral mutation(s) within the areas targeted by this assay, and inadequate number of viral copies(<138 copies/mL). A negative result must be combined with clinical observations, patient history, and epidemiological information. The expected result is Negative.  Fact Sheet for Patients:  BloggerCourse.com  Fact Sheet for Healthcare Providers:  SeriousBroker.it  This test is no t yet approved or cleared by the [place] FDA and  has been authorized for detection and/or diagnosis of SARS-CoV-2 by FDA under an Emergency Use Authorization (EUA). This EUA will remain  in effect (meaning this test can be used) for the duration of the COVID-19 declaration under Section 564(b)(1) of the Act, 21 U.S.C.section 360bbb-3(b)(1), unless the authorization is terminated  or revoked sooner.       Influenza A by PCR NEGATIVE NEGATIVE Final   Influenza B by PCR NEGATIVE NEGATIVE Final    Comment: (NOTE) The Xpert Xpress SARS-CoV-2/FLU/RSV plus assay is intended as an aid in the diagnosis of influenza from Nasopharyngeal swab specimens and should not be used as a sole basis for treatment. Nasal washings and aspirates are unacceptable for Xpert Xpress SARS-CoV-2/FLU/RSV testing.  Fact Sheet for Patients: BloggerCourse.com  Fact Sheet for Healthcare Providers: SeriousBroker.it  This test is not yet approved or cleared by the [place] FDA and has been authorized for detection and/or diagnosis of  SARS-CoV-2 by FDA under an Emergency Use Authorization (EUA). This EUA will remain in effect (meaning this test can be used) for the duration of the COVID-19 declaration under Section 564(b)(1) of the Act, 21 U.S.C. section 360bbb-3(b)(1), unless the authorization is terminated or revoked.  Performed at [facility], [address], [place], [place] [address]       Radiology Studies: No results found.  Scheduled Meds: . Chlorhexidine Gluconate Cloth  6 each Topical Daily  .  colestipol  1 g Oral BID  . COVID-19 mRNA vaccine (Moderna)  0.5 mL Intramuscular Once  . enoxaparin (LOVENOX) injection  40 mg Subcutaneous Q24H  . feeding supplement  1 Container Oral TID BM  . lipase/protease/amylase  72,000 Units Oral TID WC  . montelukast  10 mg Oral Daily  . pantoprazole (PROTONIX) IV  40 mg Intravenous Q12H  . sodium chloride flush  10-40 mL Intracatheter Q12H  . sucralfate  1 g Oral QID   Continuous Infusions: . lactated ringers 100 mL/hr at 10/01/20 0649  . TPN CYCLIC-ADULT (ION) 113 mL/hr at 09/30/20 1932  . TPN CYCLIC-ADULT (ION)       LOS: 5 days   Time spent: 26 minutes   Hughie Clossavi Omeed Osuna, MD [facility]  10/01/2020, 9:57 AM   To contact the attending provider between 7A-7P or the covering provider during after hours 7P-7A, please log into the web site [email or web address].

## 2020-10-01 NOTE — Care Management (Addendum)
  Was asked by management of [facility] of transportation services to [facility]. Assisted director trying to arrange.    Checked with [facility] [employer]., transport to acute to acute must be done by [facility], not [facility] or other medical transport agencies.   Ronny Flurry RN

## 2020-10-01 NOTE — Progress Notes (Addendum)
PHARMACY - TOTAL PARENTERAL NUTRITION CONSULT NOTE  Indication: Complicated pancreatitis with loculated pseudocysts  Patient Measurements: Height: 5\' 7"  (170.2 cm) Weight: 58.4 kg (128 lb 12 oz) IBW/kg (Calculated) : 61.6 TPN AdjBW (KG): 58.4 Body mass index is 20.16 kg/m.  Assessment:  70 YOF recently admitted 10/20-11/17 with necrotizing pancreatitis with infected pseudocyst s/p cystgastrostomy and then stent removal.  Patient readmitted 11/25-12/8/21 with abdominal pain and progressive phlegmon in the LUQ.  Returned 09/26/20 with worsening abdominal pain and decreased PO intake.  Pharmacy consulted to continue TPN from PTA for recurrent pancreatitis.   Patient reports weighing ~150lbs early 2021, then lost weight with pancreatitis and was as low as 125 lb.  Since TPN started late 2021, she gained back some weight and has been maintaining at 135 lbs.  Patient remains on an 18-hr cycle TPN since discharge in Dec and has been on a liquid diet.  Any solid food would cause abdominal pain.  She missed 2 doses of TPN while holding in the ED.  Glucose / Insulin: no hx DM - hypoglycemic off TPN.  [facility] D/C'ed 10/01/19. Electrolytes: K 3.9 post 4 runs, Mag 2 post 2gm, Phos slightly elevated at 4.8 (Ca x Phos = 46, goal < 55), others WNL Renal: SCr < 1, BUN WNL LFTs / TGs: LFTs / tbili WNL, TG 69 > 171 Prealbumin / albumin: albumin up to 3.4, prealbumin WNL at 28.3, lipase up to 138 (1/5) Intake / Output; MIVF: LBM 1/6, LR at 200 ml/hr, no I/O's recorded GI Imaging:  1/6 CT: acute non-necrotizing pancreatitis, peripancreatic fluid collection and developing pseudocyst 1/8 MRCP - pancreatic cyst, mildly increased in size Surgeries / Procedures: N/A  Central access: PICC placed 08/23/20 TPN start date: chronic  Nutritional Goals (per RD rec on 1/7): 1950-2150 kCal, 105-120g protein, >1.9L fluid per day  TPN formula from [facility]:  [facility] over 18 hrs:  102g AA, 260g CHO and 40g ILE for a total of  1692 kCal per day Lytes: increasing K needs, mag 40mEq/d  Current Nutrition:  TPN Soft diet - ate ~40% of meals Boost PO TID - 3 charted given yesterday  Plan:  Extend TPN cycle to 20 hrs to help with hypoglycemia while off TPN  Continue cyclic TPN, infuse 1920 ml over 20 hrs: 51 ml/hr on first and last hr, 101 ml/hr x 18 hrs TPN provides 106g AA, 384g CHO and 31g ILE for a total of 2036 kCal, meeting 100% of needs Electrolytes: Na 30mEq/L, increase K slightly to 9mEq/L, Ca 6mEq/L, Mag incr to 18mEq/L on 1/10, reduce Phos to 84mmol/L, Cl:Ac 1:1 Add MVI and trace elements in TPN  Continue Q8H CBG checks to monitor for hypoglycemia LR at 100 ml/hr per MD Standard TPN labs on Mon/Thurs.  Will also recheck TG. F/u CBGs  Possible transfer to [facility] for a day trip on 10/02/19 for stenting, EUS, etc.  Boden Stucky D. 11/30/19, PharmD, BCPS, BCCCP 10/01/2020, 7:56 AM   =====================  Addendum: Procedure delayed until 10/04/19.  Per RN, 10/06/19 will pick patient up around 0800-0830, but cannot have med hanging during transfer.  Plan to taper TPN on 1/13 at 0700, then IV team will take down TPN at 0800 prior to transfer.  Resume TPN 1/13 at standard hang time once patient returns to [facility].  Nahomy Limburg D. [facility], PharmD, BCPS, BCCCP 10/01/2020, 2:05 PM

## 2020-10-01 NOTE — Progress Notes (Signed)
[facility] Gastroenterology Progress Note  Rogena Deupree 45 y.o. April 16, 1976   Subjective: Lying down in bedside chair. Ongoing abdominal pain. Nauseous without vomiting. No BMs.  Objective: Vital signs: Vitals:   09/30/20 2023 10/01/20 0452  BP: (!) 146/93 131/77  Pulse: 73 68  Resp: 20 15  Temp: 98.2 F (36.8 C) (!) 97.5 F (36.4 C)  SpO2: 100% 100%    Physical Exam: Gen: lethargic, no acute distress  HEENT: anicteric sclera CV: RRR Chest: CTA B Abd: diffuse tenderness (worse in LUQ) with guarding, soft, nondistended, +BS Ext: no edema  Lab Results: Recent Labs    09/30/20 0352 10/01/20 0415  NA 142 139  K 3.5 3.9  CL 106 103  CO2 29 28  GLUCOSE 103* 104*  BUN 12 15  CREATININE 0.63 0.64  CALCIUM 9.2 9.1  MG 1.7 2.0  PHOS 3.5 4.8*   Recent Labs    09/29/20 0323 09/30/20 0352  AST 21 17  ALT 31 27  ALKPHOS 109 117  BILITOT 0.2* 0.4  PROT 5.8* 6.2*  ALBUMIN 3.0* 3.4*   Recent Labs    09/30/20 0352  WBC 5.6  NEUTROABS 3.0  HGB 12.0  HCT 35.2*  MCV 91.7  PLT 249      Assessment/Plan: Acute complicated pancreatitis with an enlarging pseudocyst in the pancreatic tail in need of drainage. Edrick Kins has scheduled her for outpt drainage of the cyst and PD stent placement at [facility] but patient needs transportation to [facility] and back to [place] same day. If transportation can be arranged, then patient will go for the procedure on Thursday 10/03/20 since no report of transportation being arranged for today. Continue supportive care. Continue TPN.   [name] 10/01/2020, 9:32 AM  Questions please call [phone] ID: [name], female   DOB: [birth year], 45 y.o.   MRN: [id number]

## 2020-10-02 ENCOUNTER — Telehealth: Payer: Self-pay | Admitting: Gastroenterology

## 2020-10-02 LAB — GLUCOSE, CAPILLARY
Glucose-Capillary: 90 mg/dL (ref 70–99)
Glucose-Capillary: 117 mg/dL — ABNORMAL HIGH (ref 70–99)

## 2020-10-02 MED ORDER — INDOMETHACIN 50 MG RE SUPP
100.0000 mg | Freq: Once | RECTAL | Status: DC
  Filled 2020-10-02: qty 2

## 2020-10-02 MED ORDER — SODIUM CHLORIDE 0.9 % IV SOLN: INTRAVENOUS | Status: DC

## 2020-10-02 MED ORDER — TRACE MINERALS CU-MN-SE-ZN 300-55-60-3000 MCG/ML IV SOLN
INTRAVENOUS | Status: AC
  Filled 2020-10-02: qty 704

## 2020-10-02 NOTE — Progress Notes (Signed)
[facility] Gastroenterology Progress Note  Sherryl Valido 45 y.o. May 23, 1976  CC:  Abdominal pain, pancreatic pseudocyst  Subjective: Patient reports ongoing abdominal pain, nausea, and severe GERD.    Discussed with Dr. Jacqulyn Bath, patient on max dilaudid as well as 15 mg oxycodone QID PRN.  Made patient aware that we do not have transport capabilities to transport her to [facility] for endoscopic procedure.  Discussed options with her to include discharge from hospital (if pain controlled), with outpatient procedure at [facility] tomorrow.  Patient not amenable to this plan, stating her pain is uncontrolled on 15 oxycodone four times daily and is only controlled with concomitant Dilaudid.  States she has not had a BM for 2-3 days, previously had diarrhea.  ROS : Review of Systems  Respiratory: Negative for cough and shortness of breath.   Gastrointestinal: Positive for abdominal pain, heartburn and nausea. Negative for blood in stool, constipation, diarrhea, melena and vomiting.   Objective: Vital signs in last 24 hours: Vitals:   10/01/20 2147 10/02/20 0440  BP: 136/82 (!) 151/85  Pulse: 90 81  Resp: 20 20  Temp: 97.9 F (36.6 C) (!) 97.5 F (36.4 C)  SpO2: 100% 99%    Physical Exam:  General:  Lethargic, oriented, cooperative, no acute distress  Head:  Normocephalic, without obvious abnormality, atraumatic  Eyes:  Anicteric sclera, EOMs intact  Lungs:   Clear to auscultation bilaterally, respirations unlabored  Heart:  Regular rate and rhythm, S1, S2 normal  Abdomen:   Upper abdomen somewhat firm with moderate tenderness to palpation, no peritoneal signs.  Lower abdomen is soft, non-tender.  Sluggish bowel sounds.  Extremities: Extremities normal, atraumatic, no  edema  Pulses: 2+ and symmetric    Lab Results: Recent Labs    09/30/20 0352 10/01/20 0415  NA 142 139  K 3.5 3.9  CL 106 103  CO2 29 28  GLUCOSE 103* 104*  BUN 12 15  CREATININE 0.63 0.64  CALCIUM 9.2 9.1  MG 1.7 2.0   PHOS 3.5 4.8*   Recent Labs    09/30/20 0352  AST 17  ALT 27  ALKPHOS 117  BILITOT 0.4  PROT 6.2*  ALBUMIN 3.4*   Recent Labs    09/30/20 0352  WBC 5.6  NEUTROABS 3.0  HGB 12.0  HCT 35.2*  MCV 91.7  PLT 249   No results for input(s): LABPROT, INR in the last 72 hours.  Assessment: Pancreatitis with enlarging pseudocyst, acute on chronic pain -MRI/MRCP 09/29/20: No evidence of pancreatic edema or acute peripancreatic inflammatory changes. No evidence of pancreatic mass or necrosis. No evidence of pancreatic ductal dilatation. A well circumscribed fluid collection with thin peripheral rim enhancement is seen along the dorsal aspect of the pancreatic tail, which measures 7.0 by 2.4 cm. This has mildly increased in  size from 5.6 x 1.8 cm previously, and is consistent with a pancreatic cyst. No other peripancreatic fluid collections are seen.  Plan: Dr. Meridee Score did not have availability for procedure this week, thus we attempted day trip to [facility] for advanced endoscopy, but [facility] was unable to transport patient.  We will plan for procedure next week pending Dr. Elesa Hacker availability.  Unfortunately, no inpatient pain management service available per Dr. Jacqulyn Bath.  Consider addition of gabapentin for pain control.   Consider celiac block via EUS vs. IR for further pain control.  Continue Carafate QID and Protonix 40 mg IV BID.  Continue Creon and Colestipol.  Bentyl PRN.    Repeat CT abdomen/pelvis with IV  contrast (pancreatic protocol) Sunday 1/16 for further assessment of pseudocyst, unless clinical status requires CT sooner.   [facility] will follow.  Edrick Kins PA-C 10/02/2020, 9:33 AM  Contact #  [phone]

## 2020-10-02 NOTE — Progress Notes (Signed)
PROGRESS NOTE    Tricia Brown  JJH:[id number] DOB: 1976/07/11 DOA: 09/25/2020 PCP: [name], MD   Brief Narrative:  Tricia Brown is a 45 y.o. female with medical history significant of chronic pain, on methadone; and depression presenting with worsening abdominal pain.  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  She was most recently treated with Meropenem and MS Contin with breakthrough Oxy IR.  She was discharged home with TPN.  Since d/c, she has been taking TPN IV x 18 hours a day.  She is also on Carafate QID, Protonix once daily, Zofran TID, Bentyl, and pancreatic enzymes BID.  She is also on binder BID to help with diarrhea.  She started with temp to 99 -> 100 over the last few days.  She was no longer able to keep ginger ale and broth down.  She was having trouble keeping her pills down too.  They are trying to get her referred to [facility].  The pain has been gradually worsening for about a week.  Her pain medications are not keeping the pain at bay.  She finished her antibiotic during her last hospitalization.  Upon arrival to ED, she was hemodynamically stable and afebrile. CT with acute non-necrotizing pancreatitis with peripancreatitis fluid collection.  She was admitted to hospitalist service and GI was consulted.  MRCP was done which showed enlarging cyst at the tail of the pancreas.  Unfortunately only Dr. Meridee Score of [facility] is capable of draining the cyst and he is not available this week.  GI had arranged this procedure at [facility] for the patient on 10/01/2020 however per nursing staff, neither [facility] nor [facility] was able to transport her there and wait two and half hours.  They were only able to transport one way.  For this reason, patient could not go for the procedure.  Assessment & Plan:   Principal Problem:   Acute  pancreatitis Active Problems:   Depression   Opioid use disorder, severe, dependence (HCC)   Acute/recurrent pancreatitis/chronic pain -Patient with severe necrotizing pancreatitis, with 2 prior prolonged hospitalizations including cystogastrostomy with stent placement and then removal -Now presentied with recurrent pain, n/v. Seen by GI. They are considering for another stent.  MRCP shows enlarging cyst at the tail of the pancreas.  Unfortunately only Dr. Meridee Score of [facility] is capable of draining the cyst and he is not available this week.  GI had arranged this procedure at [facility] for the patient on 10/01/2020 however per nursing staff, neither [facility] nor [facility] was able to transport her there and wait two and half hours.  They were only able to transport one way.  For this reason, patient could not go for the procedure.  Per GI, the soonest she can have the procedure here will be next week.  They offered the patient to go home and get the procedure done as outpatient or even go home and get the procedure at [facility] tomorrow however patient's pain is not controlled on prior medications which was oxycodone 15 mg every 4 hours.  She is also using Dilaudid 1 mg every 4 hours here.  Unfortunately, she is requiring way too much of medication to the point that she cannot be safely discharged on this combination as this will put this patient at risk of respiratory depression and severe consequences.  Hypokalemia/hypomagnesemia: Resolved  COPD: Not in exacerbation. -Continue Symbicort  DVT prophylaxis:  enoxaparin (LOVENOX) injection 40 mg Start: 09/26/20 1500   Code Status: Full Code  Family Communication: None present at bedside.  Plan of care discussed with patient in length and he verbalized understanding and agreed with it.  Status is: Inpatient  Remains inpatient appropriate because:Inpatient level of care appropriate due to severity of illness   Dispo: The patient is from: Home               Anticipated d/c is to: Home              Anticipated d/c date is: 2 days              Patient currently is not medically stable to d/c.        Estimated body mass index is 21.14 kg/m as calculated from the following:   Height as of 08/15/20: 5\' 7"  (1.702 m).   Weight as of this encounter: 61.2 kg.      Nutritional status:  Nutrition Problem: Increased nutrient needs Etiology: chronic illness (pancreatitis)   Signs/Symptoms: estimated needs   Interventions: Boost Breeze,TPN    Consultants:   GI  Procedures:   None  Antimicrobials:  Anti-infectives (From admission, onward)   None         Subjective: Seen and examined.  Still pain a 5 out of 10 with persistent nausea, GERD symptoms but no vomiting.  Objective: Vitals:   10/01/20 0452 10/01/20 1300 10/01/20 2147 10/02/20 0440  BP: 131/77 (!) 151/82 136/82 (!) 151/85  Pulse: 68 80 90 81  Resp: 15 18 20 20   Temp: (!) 97.5 F (36.4 C) 98 F (36.7 C) 97.9 F (36.6 C) (!) 97.5 F (36.4 C)  TempSrc: Oral Axillary    SpO2: 100% 100% 100% 99%  Weight:        Intake/Output Summary (Last 24 hours) at 10/02/2020 1021 Last data filed at 10/02/2020 [id number] Gross per 24 hour  Intake 2200.64 ml  Output --  Net 2200.64 ml   Filed Weights   09/27/20 0900  Weight: 61.2 kg    Examination:  General exam: Appears calm and comfortable  Respiratory system: Clear to auscultation. Respiratory effort normal. Cardiovascular system: S1 & S2 heard, RRR. No JVD, murmurs, rubs, gallops or clicks. No pedal edema. Gastrointestinal system: Abdomen is nondistended, soft and upper abdominal tenderness. No organomegaly or masses felt. Normal bowel sounds heard. Central nervous system: Alert and oriented. No focal neurological deficits. Extremities: Symmetric 5 x 5 power. Skin: No rashes, lesions or ulcers.  Psychiatry: Judgement and insight appear normal. Mood & affect appropriate.   Data Reviewed: I have personally  reviewed following labs and imaging studies  CBC: Recent Labs  Lab 09/25/20 1757 09/27/20 0507 09/28/20 0346 09/30/20 0352  WBC 10.5 5.7 5.2 5.6  NEUTROABS  --   --  2.5 3.0  HGB 12.6 10.6* 10.6* 12.0  HCT 38.7 31.5* 32.6* 35.2*  MCV 91.5 92.1 92.4 91.7  PLT 298 212 229 249   Basic Metabolic Panel: Recent Labs  Lab 09/27/20 0507 09/28/20 0346 09/29/20 0323 09/30/20 0352 10/01/20 0415  NA 140 142 142 142 139  K 3.2* 3.3* 2.9* 3.5 3.9  CL 109 107 106 106 103  CO2 22 25 29 29 28   GLUCOSE 66* 132* 118* 103* 104*  BUN 18 13 10 12 15   CREATININE 0.80 0.63 0.60 0.63 0.64  CALCIUM 8.9 8.4* 8.8* 9.2 9.1  MG 1.4* 1.5* 1.7 1.7 2.0  PHOS 5.3* 3.9 4.0 3.5 4.8*  GFR: Estimated Creatinine Clearance: 86.7 mL/min (by C-G formula based on SCr of 0.64 mg/dL). Liver Function Tests: Recent Labs  Lab 09/25/20 1757 09/27/20 0507 09/28/20 0346 09/29/20 0323 09/30/20 0352  AST 20 19 19 21 17   ALT 38 26 25 31 27   ALKPHOS 111 102 109 109 117  BILITOT 0.6 0.9 0.5 0.2* 0.4  PROT 6.9 5.5* 5.5* 5.8* 6.2*  ALBUMIN 3.8 2.9* 3.0* 3.0* 3.4*   Recent Labs  Lab 09/25/20 1757  LIPASE 138*   No results for input(s): AMMONIA in the last 168 hours. Coagulation Profile: No results for input(s): INR, PROTIME in the last 168 hours. Cardiac Enzymes: No results for input(s): CKTOTAL, CKMB, CKMBINDEX, TROPONINI in the last 168 hours. BNP (last 3 results) No results for input(s): PROBNP in the last 8760 hours. HbA1C: No results for input(s): HGBA1C in the last 72 hours. CBG: Recent Labs  Lab 09/30/20 1133 09/30/20 1625 10/01/20 0015 10/01/20 0700 10/01/20 2231  GLUCAP 97 63* 109* 90 91   Lipid Profile: Recent Labs    09/30/20 0352  TRIG 171*   Thyroid Function Tests: No results for input(s): TSH, T4TOTAL, FREET4, T3FREE, THYROIDAB in the last 72 hours. Anemia Panel: No results for input(s): VITAMINB12, FOLATE, FERRITIN, TIBC, IRON, RETICCTPCT in the last 72 hours. Sepsis  Labs: No results for input(s): PROCALCITON, LATICACIDVEN in the last 168 hours.  Recent Results (from the past 240 hour(s))  Resp Panel by RT-PCR (Flu A&B, Covid) Nasopharyngeal Swab     Status: None   Collection Time: 09/26/20 12:52 PM   Specimen: Nasopharyngeal Swab; Nasopharyngeal(NP) swabs in vial transport medium  Result Value Ref Range Status   SARS Coronavirus 2 by RT PCR NEGATIVE NEGATIVE Final    Comment: (NOTE) SARS-CoV-2 target nucleic acids are NOT DETECTED.  The SARS-CoV-2 RNA is generally detectable in upper respiratory specimens during the acute phase of infection. The lowest concentration of SARS-CoV-2 viral copies this assay can detect is 138 copies/mL. A negative result does not preclude SARS-Cov-2 infection and should not be used as the sole basis for treatment or other patient management decisions. A negative result may occur with  improper specimen collection/handling, submission of specimen other than nasopharyngeal swab, presence of viral mutation(s) within the areas targeted by this assay, and inadequate number of viral copies(<138 copies/mL). A negative result must be combined with clinical observations, patient history, and epidemiological information. The expected result is Negative.  Fact Sheet for Patients:  11/28/20  Fact Sheet for Healthcare Providers:  11/24/20  This test is no t yet approved or cleared by the [employer] FDA and  has been authorized for detection and/or diagnosis of SARS-CoV-2 by FDA under an Emergency Use Authorization (EUA). This EUA will remain  in effect (meaning this test can be used) for the duration of the COVID-19 declaration under Section 564(b)(1) of the Act, 21 U.S.C.section 360bbb-3(b)(1), unless the authorization is terminated  or revoked sooner.       Influenza A by PCR NEGATIVE NEGATIVE Final   Influenza B by PCR NEGATIVE NEGATIVE Final     Comment: (NOTE) The Xpert Xpress SARS-CoV-2/FLU/RSV plus assay is intended as an aid in the diagnosis of influenza from Nasopharyngeal swab specimens and should not be used as a sole basis for treatment. Nasal washings and aspirates are unacceptable for Xpert Xpress SARS-CoV-2/FLU/RSV testing.  Fact Sheet for Patients: SeriousBroker.it  Fact Sheet for Healthcare Providers: [place]  This test is not yet approved or cleared by the [employer] FDA  and has been authorized for detection and/or diagnosis of SARS-CoV-2 by FDA under an Emergency Use Authorization (EUA). This EUA will remain in effect (meaning this test can be used) for the duration of the COVID-19 declaration under Section 564(b)(1) of the Act, 21 U.S.C. section 360bbb-3(b)(1), unless the authorization is terminated or revoked.  Performed at [facility], [address] [place], [place] [address]       Radiology Studies: No results found.  Scheduled Meds: . Chlorhexidine Gluconate Cloth  6 each Topical Daily  . colestipol  1 g Oral BID  . enoxaparin (LOVENOX) injection  40 mg Subcutaneous Q24H  . feeding supplement  1 Container Oral TID BM  . lipase/protease/amylase  72,000 Units Oral TID WC  . montelukast  10 mg Oral Daily  . pantoprazole (PROTONIX) IV  40 mg Intravenous Q12H  . sodium chloride flush  10-40 mL Intracatheter Q12H  . sucralfate  1 g Oral QID   Continuous Infusions: . lactated ringers 100 mL/hr at 10/02/20 0237  . TPN CYCLIC-ADULT (ION) 101 mL/hr at 10/02/20 0635  . TPN CYCLIC-ADULT (ION)       LOS: 6 days   Time spent: 29 minutes   Hughie Closs, MD [facility]  10/02/2020, 10:21 AM   To contact the attending provider between 7A-7P or the covering provider during after hours 7P-7A, please log into the web site [email or web address].

## 2020-10-02 NOTE — H&P (View-Only) (Signed)
[facility] Progress Note  Tricia Brown 45 y.o. May 23, 1976  CC:  Abdominal pain, pancreatic pseudocyst  Subjective: Patient reports ongoing abdominal pain, nausea, and severe GERD.    Discussed with Dr. Jacqulyn Bath, patient on max dilaudid as well as 15 mg oxycodone QID PRN.  Made patient aware that we do not have transport capabilities to transport her to [facility] for endoscopic procedure.  Discussed options with her to include discharge from hospital (if pain controlled), with outpatient procedure at [facility] tomorrow.  Patient not amenable to this plan, stating her pain is uncontrolled on 15 oxycodone four times daily and is only controlled with concomitant Dilaudid.  States she has not had a BM for 2-3 days, previously had diarrhea.  ROS : Review of Systems  Respiratory: Negative for cough and shortness of breath.   Gastrointestinal: Positive for abdominal pain, heartburn and nausea. Negative for blood in stool, constipation, diarrhea, melena and vomiting.   Objective: Vital signs in last 24 hours: Vitals:   10/01/20 2147 10/02/20 0440  BP: 136/82 (!) 151/85  Pulse: 90 81  Resp: 20 20  Temp: 97.9 F (36.6 C) (!) 97.5 F (36.4 C)  SpO2: 100% 99%    Physical Exam:  General:  Lethargic, oriented, cooperative, no acute distress  Head:  Normocephalic, without obvious abnormality, atraumatic  Eyes:  Anicteric sclera, EOMs intact  Lungs:   Clear to auscultation bilaterally, respirations unlabored  Heart:  Regular rate and rhythm, S1, S2 normal  Abdomen:   Upper abdomen somewhat firm with moderate tenderness to palpation, no peritoneal signs.  Lower abdomen is soft, non-tender.  Sluggish bowel sounds.  Extremities: Extremities normal, atraumatic, no  edema  Pulses: 2+ and symmetric    Lab Results: Recent Labs    09/30/20 0352 10/01/20 0415  NA 142 139  K 3.5 3.9  CL 106 103  CO2 29 28  GLUCOSE 103* 104*  BUN 12 15  CREATININE 0.63 0.64  CALCIUM 9.2 9.1  MG 1.7 2.0   PHOS 3.5 4.8*   Recent Labs    09/30/20 0352  AST 17  ALT 27  ALKPHOS 117  BILITOT 0.4  PROT 6.2*  ALBUMIN 3.4*   Recent Labs    09/30/20 0352  WBC 5.6  NEUTROABS 3.0  HGB 12.0  HCT 35.2*  MCV 91.7  PLT 249   No results for input(s): LABPROT, INR in the last 72 hours.  Assessment: Pancreatitis with enlarging pseudocyst, acute on chronic pain -MRI/MRCP 09/29/20: No evidence of pancreatic edema or acute peripancreatic inflammatory changes. No evidence of pancreatic mass or necrosis. No evidence of pancreatic ductal dilatation. A well circumscribed fluid collection with thin peripheral rim enhancement is seen along the dorsal aspect of the pancreatic tail, which measures 7.0 by 2.4 cm. This has mildly increased in  size from 5.6 x 1.8 cm previously, and is consistent with a pancreatic cyst. No other peripancreatic fluid collections are seen.  Plan: Dr. Meridee Score did not have availability for procedure this week, thus we attempted day trip to [facility] for advanced endoscopy, but [facility] was unable to transport patient.  We will plan for procedure next week pending Dr. Elesa Hacker availability.  Unfortunately, no inpatient pain management service available per Dr. Jacqulyn Bath.  Consider addition of gabapentin for pain control.   Consider celiac block via EUS vs. IR for further pain control.  Continue Carafate QID and Protonix 40 mg IV BID.  Continue Creon and Colestipol.  Bentyl PRN.    Repeat CT abdomen/pelvis with IV  contrast (pancreatic protocol) Sunday 1/16 for further assessment of pseudocyst, unless clinical status requires CT sooner.   [facility] will follow.  Katessa Attridge Baron-Johnson PA-C 10/02/2020, 9:33 AM  Contact #  [phone] 

## 2020-10-02 NOTE — Telephone Encounter (Signed)
I have advised the caller that I am not able to speak with him.  There is no release on file.  The pt is currently admitted- caller advised to speak with hospital nurses or doctor with any concerns.

## 2020-10-02 NOTE — Telephone Encounter (Signed)
Pt's father is requesting a call back from a nurse to discuss a stent that needs to be placed, caller states the last one was done by Dr Meridee Score.

## 2020-10-02 NOTE — Progress Notes (Signed)
PHARMACY - TOTAL PARENTERAL NUTRITION CONSULT NOTE  Indication: Complicated pancreatitis with loculated pseudocysts  Patient Measurements: Height: 5\' 7"  (170.2 cm) Weight: 58.4 kg (128 lb 12 oz) IBW/kg (Calculated) : 61.6 TPN AdjBW (KG): 58.4 Body mass index is 20.16 kg/m.  Assessment:  34 YOF recently admitted 10/20-11/17 with necrotizing pancreatitis with infected pseudocyst s/p cystgastrostomy and then stent removal.  Patient readmitted 11/25-12/8/21 with abdominal pain and progressive phlegmon in the LUQ.  Returned 09/26/20 with worsening abdominal pain and decreased PO intake.  Pharmacy consulted to continue TPN from [facility] for recurrent pancreatitis.   Patient reports weighing ~150lbs early 2021, then lost weight with pancreatitis and was as low as 125 lb.  Since homeTPN started late 2021, she gained back some weight and has been maintaining at 135 lbs.  Patient remains on an 18-hr cycle TPN since discharge in Dec and has been on a liquid diet.  Any solid food would cause abdominal pain.  She missed 2 doses of TPN while holding in the ED.  Now with enlarging cyst at the tail of the pancreas. Needs another stent for drainage. Procedure cancelled 1/13 due to inability to transport to [facility].  Glucose / Insulin: no hx DM - hypoglycemic off TPN.  [facility] D/C'ed 10/01/19. Measured glucose 90, and 91 on 1/11. Electrolytes: K 3.9 post 4 runs, Mag 2 post 2gm, Phos slightly elevated at 4.8 (Ca x Phos = 46, goal < 55), others WNL. No new labs 1/12 Renal: SCr < 1, BUN WNL LFTs / TGs: LFTs / tbili WNL, TG 69 > 171 Prealbumin / albumin: albumin up to 3.4, prealbumin WNL at 28.3, lipase up to 138 (1/5) Intake / Output; MIVF: LBM 1/6, LR at 200 ml/hr, no I/O's recorded GI Imaging:  1/6 CT: acute non-necrotizing pancreatitis, peripancreatic fluid collection and developing pseudocyst 1/8 MRCP - pancreatic cyst, mildly increased in size GI meds: Colestipol for diarrhea, Creon TID, IV PPI q12, Sucralfate  QID Surgeries / Procedures: N/A  Central access: PICC placed 08/23/20 TPN start date: chronic/home  Nutritional Goals (per RD rec on 1/7): 1950-2150 kCal, 105-120g protein, >1.9L fluid per day  TPN formula from [facility]:  [facility] over 18 hrs:  102g AA, 260g CHO and 40g ILE for a total of 1692 kCal per day Lytes: increasing K needs, mag 81mEq/d  Current Nutrition:  TPN Soft diet  Boost PO TID - 3 charted given 1/11  Plan:  Extend TPN cycle to 20 hrs to help with hypoglycemia while off TPN  Continue cyclic TPN, infuse 1920 ml over 20 hrs: 51 ml/hr on first and last hr, 101 ml/hr x 18 hrs TPN provides 105g AA, 384g CHO and 30g ILE for a total of 2036 kCal, meeting 100% of needs Electrolytes: No change 1/11. Add MVI and trace elements in TPN  Continue Q8H CBG checks to monitor for hypoglycemia LR at 100 ml/hr per MD Standard TPN labs on Mon/Thurs.  Procedure cancelled 1/13 due to inability to transport to [facility].   Saivion Goettel S. [facility], PharmD, BCPS Clinical Staff Pharmacist [email or web address] 10/02/2020, 11/30/2020

## 2020-10-02 NOTE — Progress Notes (Signed)
Due to scheduling changes, Dr. Inda Merlin ([facility]) is now able to proceed with EUS tomorrow 1/13 (here at [facility]) with plan for pancreatic cyst drainage/aspiration and possible pancreatic duct stent.  NPO after midnight.  [facility] will continue to follow.  Edrick Kins, PA-C  [facility] [phone]

## 2020-10-03 ENCOUNTER — Encounter (HOSPITAL_COMMUNITY)
Admission: EM | Disposition: A | Discharge status: Home / Self Care | Payer: Self-pay | Source: Home / Self Care | Attending: Family Medicine

## 2020-10-03 ENCOUNTER — Inpatient Hospital Stay (HOSPITAL_COMMUNITY): Payer: Medicaid Other | Admitting: Certified Registered"

## 2020-10-03 ENCOUNTER — Encounter (HOSPITAL_COMMUNITY): Payer: Self-pay | Admitting: Internal Medicine

## 2020-10-03 ENCOUNTER — Inpatient Hospital Stay (HOSPITAL_COMMUNITY): Payer: Medicaid Other

## 2020-10-03 DIAGNOSIS — K862 Cyst of pancreas: Secondary | ICD-10-CM

## 2020-10-03 DIAGNOSIS — K3189 Other diseases of stomach and duodenum: Secondary | ICD-10-CM

## 2020-10-03 DIAGNOSIS — K2970 Gastritis, unspecified, without bleeding: Secondary | ICD-10-CM

## 2020-10-03 DIAGNOSIS — K2289 Other specified disease of esophagus: Secondary | ICD-10-CM

## 2020-10-03 DIAGNOSIS — R590 Localized enlarged lymph nodes: Secondary | ICD-10-CM

## 2020-10-03 HISTORY — PX: ESOPHAGOGASTRODUODENOSCOPY (EGD) WITH PROPOFOL: SHX5813

## 2020-10-03 HISTORY — PX: ERCP: SHX5425

## 2020-10-03 HISTORY — PX: NEUROLYTIC CELIAC PLEXUS: SHX5435

## 2020-10-03 HISTORY — PX: BIOPSY: SHX5522

## 2020-10-03 HISTORY — PX: PANCREATIC STENT PLACEMENT: SHX5539

## 2020-10-03 HISTORY — PX: UPPER ESOPHAGEAL ENDOSCOPIC ULTRASOUND (EUS): SHX6562

## 2020-10-03 HISTORY — PX: REMOVAL OF STONES: SHX5545

## 2020-10-03 HISTORY — PX: SPHINCTEROTOMY: SHX5544

## 2020-10-03 LAB — COMPREHENSIVE METABOLIC PANEL
ALT: 25 U/L (ref 0–44)
AST: 16 U/L (ref 15–41)
Albumin: 3.3 g/dL — ABNORMAL LOW (ref 3.5–5.0)
Alkaline Phosphatase: 131 U/L — ABNORMAL HIGH (ref 38–126)
Anion gap: 9 (ref 5–15)
BUN: 17 mg/dL (ref 6–20)
CO2: 28 mmol/L (ref 22–32)
Calcium: 9.2 mg/dL (ref 8.9–10.3)
Chloride: 103 mmol/L (ref 98–111)
Creatinine, Ser: 0.64 mg/dL (ref 0.44–1.00)
GFR, Estimated: >60 mL/min (ref >60)
Glucose, Bld: 99 mg/dL (ref 70–99)
Potassium: 4.3 mmol/L (ref 3.5–5.1)
Sodium: 140 mmol/L (ref 135–145)
Total Bilirubin: 0.3 mg/dL (ref 0.3–1.2)
Total Protein: 5.9 g/dL — ABNORMAL LOW (ref 6.5–8.1)

## 2020-10-03 LAB — TRIGLYCERIDES: Triglycerides: 313 mg/dL — ABNORMAL HIGH (ref <150)

## 2020-10-03 LAB — MAGNESIUM: Magnesium: 1.9 mg/dL (ref 1.7–2.4)

## 2020-10-03 LAB — PHOSPHORUS: Phosphorus: 5.4 mg/dL — ABNORMAL HIGH (ref 2.5–4.6)

## 2020-10-03 LAB — GLUCOSE, CAPILLARY: Glucose-Capillary: 113 mg/dL — ABNORMAL HIGH (ref 70–99)

## 2020-10-03 SURGERY — ESOPHAGOGASTRODUODENOSCOPY (EGD) WITH PROPOFOL
Anesthesia: General

## 2020-10-03 MED ORDER — ROCURONIUM BROMIDE 10 MG/ML (PF) SYRINGE
PREFILLED_SYRINGE | INTRAVENOUS | Status: DC | PRN
  Administered 2020-10-03 (×3): 10 mg via INTRAVENOUS
  Administered 2020-10-03: 30 mg via INTRAVENOUS

## 2020-10-03 MED ORDER — PROPOFOL 500 MG/50ML IV EMUL
INTRAVENOUS | Status: DC | PRN
  Administered 2020-10-03: 20 ug/kg/min via INTRAVENOUS

## 2020-10-03 MED ORDER — MIDAZOLAM HCL 5 MG/5ML IJ SOLN
INTRAMUSCULAR | Status: DC | PRN
  Administered 2020-10-03: 2 mg via INTRAVENOUS

## 2020-10-03 MED ORDER — INDOMETHACIN 50 MG RE SUPP
RECTAL | Status: DC | PRN
  Administered 2020-10-03: 100 mg via RECTAL

## 2020-10-03 MED ORDER — FENTANYL CITRATE (PF) 100 MCG/2ML IJ SOLN
INTRAMUSCULAR | Status: AC
  Filled 2020-10-03: qty 2

## 2020-10-03 MED ORDER — GLUCAGON HCL RDNA (DIAGNOSTIC) 1 MG IJ SOLR
INTRAMUSCULAR | Status: AC
  Filled 2020-10-03: qty 1

## 2020-10-03 MED ORDER — SUGAMMADEX SODIUM 200 MG/2ML IV SOLN
INTRAVENOUS | Status: DC | PRN
  Administered 2020-10-03: 180 mg via INTRAVENOUS

## 2020-10-03 MED ORDER — ESMOLOL HCL 100 MG/10ML IV SOLN
INTRAVENOUS | Status: DC | PRN
  Administered 2020-10-03 (×2): 30 mg via INTRAVENOUS

## 2020-10-03 MED ORDER — CIPROFLOXACIN IN D5W 400 MG/200ML IV SOLN
400.0000 mg | Freq: Two times a day (BID) | INTRAVENOUS | Status: AC
  Administered 2020-10-03 – 2020-10-06 (×6): 400 mg via INTRAVENOUS
  Filled 2020-10-03 (×6): qty 200

## 2020-10-03 MED ORDER — TRIAMCINOLONE ACETONIDE 40 MG/ML IJ SUSP
INTRAMUSCULAR | Status: DC | PRN
  Administered 2020-10-03: 80 mg

## 2020-10-03 MED ORDER — BUPIVACAINE HCL 0.25 % IJ SOLN
10.0000 mL | Freq: Once | INTRAMUSCULAR | Status: AC
  Administered 2020-10-03: 8 mL
  Filled 2020-10-03: qty 10

## 2020-10-03 MED ORDER — PHENYLEPHRINE HCL-NACL 10-0.9 MG/250ML-% IV SOLN
INTRAVENOUS | Status: DC | PRN
  Administered 2020-10-03: 50 ug/min via INTRAVENOUS

## 2020-10-03 MED ORDER — ENOXAPARIN SODIUM 40 MG/0.4ML ~~LOC~~ SOLN
40.0000 mg | SUBCUTANEOUS | Status: DC
  Administered 2020-10-05 – 2020-10-09 (×5): 40 mg via SUBCUTANEOUS
  Filled 2020-10-03 (×5): qty 0.4

## 2020-10-03 MED ORDER — CIPROFLOXACIN IN D5W 400 MG/200ML IV SOLN
INTRAVENOUS | Status: DC | PRN
  Administered 2020-10-03: 400 mg via INTRAVENOUS

## 2020-10-03 MED ORDER — SODIUM CHLORIDE 0.9 % IV SOLN
INTRAVENOUS | Status: DC | PRN
  Administered 2020-10-03: 25 mL

## 2020-10-03 MED ORDER — MIDAZOLAM HCL 2 MG/2ML IJ SOLN
INTRAMUSCULAR | Status: AC
  Filled 2020-10-03: qty 2

## 2020-10-03 MED ORDER — LIDOCAINE 2% (20 MG/ML) 5 ML SYRINGE
INTRAMUSCULAR | Status: DC | PRN
  Administered 2020-10-03: 60 mg via INTRAVENOUS

## 2020-10-03 MED ORDER — TRIAMCINOLONE ACETONIDE 40 MG/ML IJ SUSP
INTRAMUSCULAR | Status: AC
  Filled 2020-10-03: qty 1

## 2020-10-03 MED ORDER — ONDANSETRON HCL 4 MG/2ML IJ SOLN
INTRAMUSCULAR | Status: DC | PRN
  Administered 2020-10-03: 4 mg via INTRAVENOUS

## 2020-10-03 MED ORDER — IOHEXOL 300 MG/ML  SOLN
INTRAMUSCULAR | Status: DC | PRN
Start: 2020-10-03 — End: 2020-10-03
  Administered 2020-10-03: 10 mL

## 2020-10-03 MED ORDER — PROPOFOL 10 MG/ML IV BOLUS
INTRAVENOUS | Status: DC | PRN
  Administered 2020-10-03: 180 mg via INTRAVENOUS

## 2020-10-03 MED ORDER — CIPROFLOXACIN IN D5W 400 MG/200ML IV SOLN
INTRAVENOUS | Status: AC
  Filled 2020-10-03: qty 200

## 2020-10-03 MED ORDER — FENTANYL CITRATE (PF) 100 MCG/2ML IJ SOLN
INTRAMUSCULAR | Status: DC | PRN
  Administered 2020-10-03: 100 ug via INTRAVENOUS

## 2020-10-03 MED ORDER — INDOMETHACIN 50 MG RE SUPP
RECTAL | Status: AC
  Filled 2020-10-03: qty 2

## 2020-10-03 MED ORDER — SUCCINYLCHOLINE CHLORIDE 200 MG/10ML IV SOSY
PREFILLED_SYRINGE | INTRAVENOUS | Status: DC | PRN
  Administered 2020-10-03: 100 mg via INTRAVENOUS

## 2020-10-03 MED ORDER — TRACE MINERALS CU-MN-SE-ZN 300-55-60-3000 MCG/ML IV SOLN
INTRAVENOUS | Status: AC
  Filled 2020-10-03: qty 704

## 2020-10-03 MED ORDER — TRIAMCINOLONE ACETONIDE 40 MG/ML IJ SUSP
INTRAMUSCULAR | Status: AC
  Filled 2020-10-03: qty 2

## 2020-10-03 MED ORDER — GLUCAGON HCL RDNA (DIAGNOSTIC) 1 MG IJ SOLR
INTRAMUSCULAR | Status: DC | PRN
  Administered 2020-10-03 (×2): .25 mg via INTRAVENOUS

## 2020-10-03 MED ORDER — DEXAMETHASONE SODIUM PHOSPHATE 10 MG/ML IJ SOLN
INTRAMUSCULAR | Status: DC | PRN
  Administered 2020-10-03: 10 mg via INTRAVENOUS

## 2020-10-03 SURGICAL SUPPLY — 14 items

## 2020-10-03 NOTE — Interval H&P Note (Signed)
History and Physical Interval Note:  10/03/2020 10:04 AM  [name]  has presented today for surgery, with the diagnosis of pancreatic pseudocyst.  The various methods of treatment have been discussed with the patient and family. After consideration of risks, benefits and other options for treatment, the patient has consented to  Procedure(s): ESOPHAGOGASTRODUODENOSCOPY (EGD) WITH PROPOFOL (N/A) UPPER ESOPHAGEAL ENDOSCOPIC ULTRASOUND (EUS) (N/A) ENDOSCOPIC RETROGRADE CHOLANGIOPANCREATOGRAPHY (ERCP) (N/A) as a surgical intervention.  The patient's history has been reviewed, patient examined, no change in status, stable for surgery.  I have reviewed the patient's chart and labs.  Questions were answered to the patient's satisfaction.     I have had an opportunity to discuss this case with [facility] gastroenterology.  The patient's presentation of smoldering persistent active pancreatitis for the last few weeks requiring TPN continuation as well as recurrence of new pancreatic cyst that are enlarging is concerning for a potential pancreatic duct leak.  The patient's pain seems out of proportion however based on the size of this pancreatic cyst for it to be a absolute cause of her persistent pain.  She did have some significant improvement when the much larger cyst was drained through AXIOS cyst gastrostomy previously but I am not sure that this is large enough for an AXIOS cyst to be required.  The question of cyst aspiration to ensure no infection and see if that improves the patient's symptoms is reasonable to consider.  The concern by the team is whether a potential celiac axis block could be considered as well for attempt at improving the patient's pain and discomfort.  I think this is not unreasonable although she does not have true chronic pancreatitis the smoldering nature of things over the course the last 2 months makes [place] concerned that she may be developing significant pain as a result of activity  in this region.  As such, the goal today will be for [place] to first try and see if we can access the pancreas duct in an effort of trying to see if we can find a pancreas duct leak and stent the pancreas to try and see if we can improve drainage such that cyst enlargement does not continue.  The next apt will be to evaluate potentially, based on our timing of things, cyst aspiration versus drainage and/or the potential for a celiac axis block.  The risks of an EUS including intestinal perforation, bleeding, infection, aspiration, and medication effects were discussed as was the possibility it may not give a definitive diagnosis if a biopsy is performed.  When a biopsy of the pancreas is done as part of the EUS, there is an additional risk of pancreatitis at the rate of about 1-2%.  It was explained that procedure related pancreatitis is typically mild, although it can be severe and even life threatening, which is why we do not perform random pancreatic biopsies and only biopsy a lesion/area we feel is concerning enough to warrant the risk.   The risks of an ERCP were discussed at length, including but not limited to the risk of perforation, bleeding, abdominal pain, post-ERCP pancreatitis (while usually mild can be severe and even life threatening).  When celiac axis blocks are performed there are increased risks.  These risks include the possibility of postural hypotension that can be transient for a few hours to few days.  There is a risk of retroperitoneal or intra-abdominal abscess or bleeding.  There is also the risk of transient paralysis versus complete paralysis that has been  noted in the literature.  This is more often seen in patients who end up getting true celiac neurolysis which is not our plan for today but rather celiac block without alcohol use.  If I am unable to perform this today we may ask Dr. Dulce Sellar of [facility] who performs in the past celiac blocks to see if that is possible versus asking  our interventional radiologist to see if they can consider a celiac block.  Overall, with the patient's current status and already significant pancreatitis we are going to have a much higher risk for superimposed pancreatitis to develop even if we are successful or if we are not successful today.  The patient understands these risks and accepts them to move forward.  I also had a long conversation with the patient's father whom the patient gave the okay for me to speak with.  We have gone over these potential risks.  The patient's father also understands the potential risks and agrees with what ever his daughter has agreed upon in regards to attempt at trying to help her.   [employer]

## 2020-10-03 NOTE — Progress Notes (Signed)
PROGRESS NOTE    Tricia Brown  KZS:[id number] DOB: 10/30/1975 DOA: 09/25/2020 PCP: [name], MD   Brief Narrative:  Tricia Brown is a 45 y.o. female with medical history significant of chronic pain, on methadone; and depression presenting with worsening abdominal pain.  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  She was most recently treated with Meropenem and MS Contin with breakthrough Oxy IR.  She was discharged home with TPN.  Since d/c, she has been taking TPN IV x 18 hours a day.  She is also on Carafate QID, Protonix once daily, Zofran TID, Bentyl, and pancreatic enzymes BID.  She is also on binder BID to help with diarrhea.  She started with temp to 99 -> 100 over the last few days.  She was no longer able to keep ginger ale and broth down.  She was having trouble keeping her pills down too.  They are trying to get her referred to [facility].  The pain has been gradually worsening for about a week.  Her pain medications are not keeping the pain at bay.  She finished her antibiotic during her last hospitalization.  Upon arrival to ED, she was hemodynamically stable and afebrile. CT with acute non-necrotizing pancreatitis with peripancreatitis fluid collection.  She was admitted to hospitalist service and GI was consulted.  MRCP was done which showed enlarging cyst at the tail of the pancreas.  Unfortunately only Dr. Meridee Score of [facility] is capable of draining the cyst and he is not available this week.  GI had arranged this procedure at [facility] for the patient on 10/01/2020 however per nursing staff, neither [facility] nor [facility] was able to transport her there and wait two and half hours.  They were only able to transport one way.  For this reason, patient could not go for the procedure.  Assessment & Plan:   Principal Problem:   Acute  pancreatitis Active Problems:   Depression   Opioid use disorder, severe, dependence (HCC)   Acute/recurrent pancreatitis/chronic pain -Patient with severe necrotizing pancreatitis, with 2 prior prolonged hospitalizations including cystogastrostomy with stent placement and then removal -Now presentied with recurrent pain, n/v. Seen by GI. They are considering for another stent.  MRCP shows enlarging cyst at the tail of the pancreas.  Unfortunately only Dr. Meridee Score of [facility] is capable of draining the cyst and he is not available this week.  GI had arranged this procedure at [facility] for the patient on 10/01/2020 however per nursing staff, neither [facility] nor [facility] was able to transport her there and wait two and half hours.  They were only able to transport one way.  For this reason, patient could not go for the procedure.  However, Dr. Meridee Score had some availability opened up for today so she is down for EUS and drainage of the pancreatic cyst with possible pancreatic stent placement.  Appreciate your help.  Hypokalemia/hypomagnesemia: Resolved  COPD: Not in exacerbation. -Continue Symbicort  DVT prophylaxis: enoxaparin (LOVENOX) injection 40 mg Start: 09/26/20 1500   Code Status: Full Code  Family Communication: None present at bedside.  Plan of care discussed with patient in length and he verbalized understanding and agreed with it.  Status is: Inpatient  Remains inpatient appropriate because:Inpatient level of care appropriate due to severity of illness   Dispo: The patient is from: Home  Anticipated d/c is to: Home              Anticipated d/c date is: 2 days              Patient currently is not medically stable to d/c.        Estimated body mass index is 21.14 kg/m as calculated from the following:   Height as of this encounter: 5\' 7"  (1.702 m).   Weight as of this encounter: 61.2 kg.      Nutritional status:  Nutrition Problem: Increased  nutrient needs Etiology: chronic illness (pancreatitis)   Signs/Symptoms: estimated needs   Interventions: Boost Breeze,TPN    Consultants:   GI  Procedures:   None  Antimicrobials:  Anti-infectives (From admission, onward)   None         Subjective: Seen and examined this morning.  Continues to have same amount of pain with nausea but no vomiting.  No change in symptoms.  She is happy and thankful that she is going to get the procedure today.  She is hopeful that her symptoms will get better after that.  Objective: Vitals:   10/02/20 0440 10/02/20 1448 10/03/20 0602 10/03/20 0928  BP: (!) 151/85 133/73 113/79 (!) 151/78  Pulse: 81 93 92 94  Resp: 20 18 18 15   Temp: (!) 97.5 F (36.4 C) 97.9 F (36.6 C) 98.9 F (37.2 C) 98.1 F (36.7 C)  TempSrc:  Oral Oral Temporal  SpO2: 99% 100% 98% 100%  Weight:    61.2 kg  Height:    5\' 7"  (1.702 m)    Intake/Output Summary (Last 24 hours) at 10/03/2020 0947 Last data filed at 10/03/2020 0602 Gross per 24 hour  Intake 500 ml  Output --  Net 500 ml   Filed Weights   09/27/20 0900 10/03/20 0928  Weight: 61.2 kg 61.2 kg    Examination:  General exam: Appears calm and comfortable  Respiratory system: Clear to auscultation. Respiratory effort normal. Cardiovascular system: S1 & S2 heard, RRR. No JVD, murmurs, rubs, gallops or clicks. No pedal edema. Gastrointestinal system: Abdomen is nondistended, soft and upper abdominal tenderness.  No organomegaly or masses felt. Normal bowel sounds heard. Central nervous system: Alert and oriented. No focal neurological deficits. Extremities: Symmetric 5 x 5 power. Skin: No rashes, lesions or ulcers.  Psychiatry: Judgement and insight appear normal. Mood & affect appropriate.   Data Reviewed: I have personally reviewed following labs and imaging studies  CBC: Recent Labs  Lab 09/27/20 0507 09/28/20 0346 09/30/20 0352  WBC 5.7 5.2 5.6  NEUTROABS  --  2.5 3.0  HGB 10.6*  10.6* 12.0  HCT 31.5* 32.6* 35.2*  MCV 92.1 92.4 91.7  PLT 212 229 249   Basic Metabolic Panel: Recent Labs  Lab 09/28/20 0346 09/29/20 0323 09/30/20 0352 10/01/20 0415 10/03/20 0312  NA 142 142 142 139 140  K 3.3* 2.9* 3.5 3.9 4.3  CL 107 106 106 103 103  CO2 25 29 29 28 28   GLUCOSE 132* 118* 103* 104* 99  BUN 13 10 12 15 17   CREATININE 0.63 0.60 0.63 0.64 0.64  CALCIUM 8.4* 8.8* 9.2 9.1 9.2  MG 1.5* 1.7 1.7 2.0 1.9  PHOS 3.9 4.0 3.5 4.8* 5.4*   GFR: Estimated Creatinine Clearance: 86.7 mL/min (by C-G formula based on SCr of 0.64 mg/dL). Liver Function Tests: Recent Labs  Lab 09/27/20 0507 09/28/20 0346 09/29/20 0323 09/30/20 0352 10/03/20 0312  AST 19 19 21 17  16  ALT 26 25 31 27 25   ALKPHOS 102 109 109 117 131*  BILITOT 0.9 0.5 0.2* 0.4 0.3  PROT 5.5* 5.5* 5.8* 6.2* 5.9*  ALBUMIN 2.9* 3.0* 3.0* 3.4* 3.3*   No results for input(s): LIPASE, AMYLASE in the last 168 hours. No results for input(s): AMMONIA in the last 168 hours. Coagulation Profile: No results for input(s): INR, PROTIME in the last 168 hours. Cardiac Enzymes: No results for input(s): CKTOTAL, CKMB, CKMBINDEX, TROPONINI in the last 168 hours. BNP (last 3 results) No results for input(s): PROBNP in the last 8760 hours. HbA1C: No results for input(s): HGBA1C in the last 72 hours. CBG: Recent Labs  Lab 10/01/20 0700 10/01/20 2231 10/02/20 1447 10/02/20 2153 10/03/20 0559  GLUCAP 90 91 90 117* 113*   Lipid Profile: Recent Labs    10/03/20 0312  TRIG 313*   Thyroid Function Tests: No results for input(s): TSH, T4TOTAL, FREET4, T3FREE, THYROIDAB in the last 72 hours. Anemia Panel: No results for input(s): VITAMINB12, FOLATE, FERRITIN, TIBC, IRON, RETICCTPCT in the last 72 hours. Sepsis Labs: No results for input(s): PROCALCITON, LATICACIDVEN in the last 168 hours.  Recent Results (from the past 240 hour(s))  Resp Panel by RT-PCR (Flu A&B, Covid) Nasopharyngeal Swab     Status: None    Collection Time: 09/26/20 12:52 PM   Specimen: Nasopharyngeal Swab; Nasopharyngeal(NP) swabs in vial transport medium  Result Value Ref Range Status   SARS Coronavirus 2 by RT PCR NEGATIVE NEGATIVE Final    Comment: (NOTE) SARS-CoV-2 target nucleic acids are NOT DETECTED.  The SARS-CoV-2 RNA is generally detectable in upper respiratory specimens during the acute phase of infection. The lowest concentration of SARS-CoV-2 viral copies this assay can detect is 138 copies/mL. A negative result does not preclude SARS-Cov-2 infection and should not be used as the sole basis for treatment or other patient management decisions. A negative result may occur with  improper specimen collection/handling, submission of specimen other than nasopharyngeal swab, presence of viral mutation(s) within the areas targeted by this assay, and inadequate number of viral copies(<138 copies/mL). A negative result must be combined with clinical observations, patient history, and epidemiological information. The expected result is Negative.  Fact Sheet for Patients:  BloggerCourse.comhttps://www.fda.gov/media/152166/download  Fact Sheet for Healthcare Providers:  SeriousBroker.ithttps://www.fda.gov/media/152162/download  This test is no t yet approved or cleared by the [place] FDA and  has been authorized for detection and/or diagnosis of SARS-CoV-2 by FDA under an Emergency Use Authorization (EUA). This EUA will remain  in effect (meaning this test can be used) for the duration of the COVID-19 declaration under Section 564(b)(1) of the Act, 21 U.S.C.section 360bbb-3(b)(1), unless the authorization is terminated  or revoked sooner.       Influenza A by PCR NEGATIVE NEGATIVE Final   Influenza B by PCR NEGATIVE NEGATIVE Final    Comment: (NOTE) The Xpert Xpress SARS-CoV-2/FLU/RSV plus assay is intended as an aid in the diagnosis of influenza from Nasopharyngeal swab specimens and should not be used as a sole basis for treatment.  Nasal washings and aspirates are unacceptable for Xpert Xpress SARS-CoV-2/FLU/RSV testing.  Fact Sheet for Patients: BloggerCourse.comhttps://www.fda.gov/media/152166/download  Fact Sheet for Healthcare Providers: SeriousBroker.ithttps://www.fda.gov/media/152162/download  This test is not yet approved or cleared by the [place] FDA and has been authorized for detection and/or diagnosis of SARS-CoV-2 by FDA under an Emergency Use Authorization (EUA). This EUA will remain in effect (meaning this test can be used) for the duration of the COVID-19 declaration under Section 564(b)(1)  of the Act, 21 U.S.C. section 360bbb-3(b)(1), unless the authorization is terminated or revoked.  Performed at [facility], [address], [place], [place] [address]       Radiology Studies: No results found.  Scheduled Meds: . bupivacaine  10 mL Infiltration Once  . [MAR Hold] Chlorhexidine Gluconate Cloth  6 each Topical Daily  . [MAR Hold] colestipol  1 g Oral BID  . [MAR Hold] enoxaparin (LOVENOX) injection  40 mg Subcutaneous Q24H  . [MAR Hold] feeding supplement  1 Container Oral TID BM  . [MAR Hold] indomethacin  100 mg Rectal Once  . [MAR Hold] lipase/protease/amylase  72,000 Units Oral TID WC  . [MAR Hold] montelukast  10 mg Oral Daily  . [MAR Hold] pantoprazole (PROTONIX) IV  40 mg Intravenous Q12H  . [MAR Hold] sodium chloride flush  10-40 mL Intracatheter Q12H  . [MAR Hold] sucralfate  1 g Oral QID   Continuous Infusions: . sodium chloride    . lactated ringers 100 mL/hr at 10/03/20 0934  . [MAR Hold] TPN CYCLIC-ADULT (ION) 101 mL/hr at 10/02/20 1854  . [MAR Hold] TPN CYCLIC-ADULT (ION)       LOS: 7 days   Time spent: 26 minutes   Hughie Closs, MD [facility]  10/03/2020, 9:47 AM   To contact the attending provider between 7A-7P or the covering provider during after hours 7P-7A, please log into the web site [email or web address].

## 2020-10-03 NOTE — Progress Notes (Signed)
   On-call note  Nursing staff Gerlene Burdock) reports that patient has been a little lightheaded and seeing some black spots but no visual disturbance vital signs are stable.  Abdominal pain is increasing some.  Spoke to hospitalist hospitalist recommended he call me as I am on-call and she had an EUS with celiac plexus block.  Pancreatic pseudocyst was not drained due to clinical situation was not thought to be appropriate.  There are apparently no other issues ongoing.  I reviewed the chart.  I recommended she be observed since there is no discrete visual loss or anything further evaluation should she deteriorate or fail to improve should be carried out by the hospitalists as I am not suspecting that this is related to the procedure itself though could be side effects of anesthesia which should improve with time.

## 2020-10-03 NOTE — Progress Notes (Signed)
PHARMACY - TOTAL PARENTERAL NUTRITION CONSULT NOTE  Indication: Complicated pancreatitis with loculated pseudocysts  Patient Measurements: Height: 5\' 7"  (170.2 cm) Weight: 58.4 kg (128 lb 12 oz) IBW/kg (Calculated) : 61.6 TPN AdjBW (KG): 58.4 Body mass index is 20.16 kg/m.  Assessment:  31 YOF recently admitted 10/20-11/17 with necrotizing pancreatitis with infected pseudocyst s/p cystgastrostomy and then stent removal.  Patient readmitted 11/25-12/8/21 with abdominal pain and progressive phlegmon in the LUQ.  Returned 09/26/20 with worsening abdominal pain and decreased PO intake.  Pharmacy consulted to continue TPN from [facility] for recurrent pancreatitis.   Patient reports weighing ~150lbs early 2021, then lost weight with pancreatitis and was as low as 125 lb.  Since homeTPN started late 2021, she gained back some weight and has been maintaining at 135 lbs.  Patient remains on an 18-hr cycle TPN since discharge in Dec and has been on a liquid diet.  Any solid food would cause abdominal pain.  She missed 2 doses of TPN while holding in the ED.  Now with enlarging pancreatic pseudocyst. Now plan EUS with pancreatic cyst drainage/aspiration and possible pancreatic duct stent at [facility] today 1/13. - Still c/o nausea, abdominal pain, and GERD. High dose opiates.  Glucose / Insulin: no hx DM - hypoglycemic off TPN.  [facility] D/C'ed 10/01/19. Measured glucose 90 (off TPN) and 117, 113 on TPN. Electrolytes: K 4.3. Phos up to 5.4 despite minimal amount in TPN. Mg 1.9 ok.  Renal: SCr < 1, BUN WNL LFTs / TGs: LFTs / tbili WNL, TG 69 > 171>313 Prealbumin / albumin: albumin up to 3.3, prealbumin WNL at 28.3, lipase up to 138 (1/5) Intake / Output; MIVF: LBM 1/6 (high dose opiates), LR at 200 ml/hr, no I/O's recorded GI Imaging:  1/6 CT: acute non-necrotizing pancreatitis, peripancreatic fluid collection and developing pseudocyst 1/8 MRCP - pancreatic cyst, mildly increased in size GI meds: Colestipol for  diarrhea, Creon TID, IV PPI q12, Sucralfate QID Surgeries / Procedures: N/A  Central access: PICC placed 08/23/20 TPN start date: chronic/home  Nutritional Goals (per RD rec on 1/7): 1950-2150 kCal, 105-120g protein, >1.9L fluid per day  TPN formula from [facility]:  [facility] over 18 hrs:  102g AA, 260g CHO and 40g ILE for a total of 1692 kCal per day Lytes: increasing K needs, mag 26mEq/d  Current Nutrition:  TPN Soft diet: only 300 charted Boost PO TID - 3 charted given 1/12  Plan:  Extend TPN cycle to 20 hrs to help with hypoglycemia while off TPN  Continue cyclic TPN, infuse 1920 ml over 20 hrs: 51 ml/hr on first and last hr, 101 ml/hr x 18 hrs TPN provides 105g AA, 384g CHO and 0g ILE for a total of 1728 kCal, meeting 88% of needs - Remove lipids due to some po intake and elevated triglyceride levels with lipid emulsion shortage. Electrolytes: Completely remove phos Add MVI and trace elements in TPN  Continue Q8H CBG checks to monitor for hypoglycemia LR at 100 ml/hr per MD Standard TPN labs on Mon/Thurs.  EUS with pancreatic cyst drainage/aspiration and possible pancreatic duct stent at [facility] today 1/13. High recommend adding a bowel regimen with high dose opiates.    Jacobie Stamey S. 2/13, PharmD, BCPS Clinical Staff Pharmacist [email or web address] 10/03/2020, 10/05/2020

## 2020-10-03 NOTE — Op Note (Signed)
[facility] Patient Name: Tricia Brown Procedure Date : 10/03/2020 MRN: [id number] Attending MD: Justice Britain , MD Date of Birth: Feb 02, 1976 CSN: [id number] Age: 45 Admit Type: Inpatient Procedure:                Upper EUS Indications:              Pancreatic cyst on MRCP - query need for cyst                            aspiration, Acute recurrent smoldering                            pancreatitis; Concern for possible pancreatic                            ductal leak Providers:                Justice Britain, MD, Kary Kos RN, RN,                            Laverda Sorenson, Lupita Leash, CRNA Referring MD:             Clarene Essex, MD, [facility] Medicines:                General Anesthesia Complications:            No immediate complications. Estimated Blood Loss:     Estimated blood loss was minimal. Procedure:                Pre-Anesthesia Assessment:                           - Prior to the procedure, a History and Physical                            was performed, and patient medications and                            allergies were reviewed. The patient's tolerance of                            previous anesthesia was also reviewed. The risks                            and benefits of the procedure and the sedation                            options and risks were discussed with the patient.                            All questions were answered, and informed consent                            was obtained. Prior Anticoagulants: The patient has  taken no previous anticoagulant or antiplatelet                            agents. ASA Grade Assessment: III - A patient with                            severe systemic disease. After reviewing the risks                            and benefits, the patient was deemed in                            satisfactory condition to undergo the procedure.                            After obtaining informed consent, the endoscope was                            passed under direct vision. Throughout the                            procedure, the patient's blood pressure, pulse, and                            oxygen saturations were monitored continuously. The                            GF-UCT180 ([id number]) Olympus Linear EUS scope was                            introduced through the mouth, and advanced to the                            duodenum for ultrasound examination from the                            stomach and duodenum. The upper EUS was                            accomplished without difficulty. The patient                            tolerated the procedure. Scope In: Scope Out: Findings:      ENDOSONOGRAPHIC FINDING: :      The celiac region and the plexus were visualized. After the rest of the       EUS was complete, decision was made in regards to trying to help pain to       proceed with a Celiac plexus block. The region of the celiac plexus and       celiac ganglia was identified endosonographically once again (near the       45 cm mark) with Color Doppler imaging, using the take-off of the celiac       trunk from the anterior aspect of the aorta as the main anatomical  landmark. Color Doppler guidance was also used to confirm a lack of       significant vascular structures within the injection needle path. Using       a transgastric approach, a 20 gauge celiac plexus needle was advanced to       an injection site in the area of the celiac artery take-off. Needle       aspiration was performed prior to injection to exclude entry into a       blood vessel. A total of eight mL of 0.25% bupivacaine and 80 mg of       triamcinolone (40 mg/mL - 2 mL total) were injected for the celiac       plexus block. This was then pushed through the The needle was then       withdrawn.      An anechoic lesion suggestive of a cyst was identified in the pancreatic        tail. It is not in obvious communication with the pancreatic duct. The       lesion measured 48 mm by 16 mm in maximal cross-sectional diameter.       There were many compartments thinly septated. The outer wall of the       lesion was thick. There was no associated mass. There was no internal       debris within the fluid-filled cavity. Cyst aspiration not performed      Pancreatic parenchymal abnormalities were noted in the entire pancreas.       These consisted of hyperechoic strands. The pancreatic duct in the head       measured 0.6 mm. The pancreatic duct in the neck measured 0.7 mm. The       pancreatic duct in the body measured 0.8 mm. The pancreatic duct in the       tail measured 0.6 mm.      Endosonographic imaging in the visualized portion of the liver showed no       mass-lesion.      A few enlarged lymph nodes were visualized in the celiac region (level       20). The nodes were triangular, isoechoic and had well defined margins.       Query benign-inflammatory in nature. Impression:               - A cystic lesion was seen in the pancreatic tail.                            Tissue and fluid were not obtained. However, the                            endosonographic appearance is suggestive of a                            pancreatic pseudocyst with multiple septations. It                            is too small for AXIOS drainage and I had concern                            that simple aspiration would not lead to complete  removal of the fluid due to the septations. With a                            normal WBC count while not on antibiotics I did not                            feel need to aspirate this since it is not clear                            that she is actively infected in this area.                           - Pancreatic parenchymal abnormalities consisting                            of hyperechoic strands were noted in the entire                             pancreas.                           - A few enlarged lymph nodes were visualized in the                            celiac region (level 20). Tissue has not been                            obtained. However, the endosonographic appearance                            is suggestive of benign inflammatory changes.                           - Celiac plexus block performed. Recommendation:           - The patient will be observed post-procedure,                            until all discharge criteria are met.                           - Return patient to hospital ward for ongoing care.                           - Clear liquid diet today and advance diet as per                            medical team and [facility] team and Nutrition.                           - Observe patient's clinical course.                           - Do not have patient  get up from bed for 4-6 hours                            due to concern for postrual hypotension that can                            occur at times after Celiac plexus blockade.                           - Recommend IV or Oral antibiotics for 72 hours to                            decrease post EUS infectious complications.                           - May restart Lovenox in 48 hours (1/15) to                            decrease risk of bleeding post-intervention.                           - The findings and recommendations were discussed                            with the patient.                           - The findings and recommendations were discussed                            with the patient's family.                           - The findings and recommendations were discussed                            with the referring physician. Procedure Code(s):        --- Professional ---                           [phone], Esophagogastroduodenoscopy, flexible,                            transoral; with transendoscopic ultrasound-guided                             transmural injection of diagnostic or therapeutic                            substance(s) (eg, anesthetic, neurolytic agent) or                            fiducial marker(s) (includes endoscopic ultrasound  examination of the esophagus, stomach, and either                            the duodenum or a surgically altered stomach where                            the jejunum is examined distal to the anastomosis) Diagnosis Code(s):        --- Professional ---                           R59.0, Localized enlarged lymph nodes                           K86.2, Cyst of pancreas                           K86.9, Disease of pancreas, unspecified                           K85.90, Acute pancreatitis without necrosis or                            infection, unspecified CPT copyright 2019 American [employer]. All rights reserved. The codes documented in this report are preliminary and upon coder review may  be revised to meet current compliance requirements. Justice Britain, MD 10/03/2020 1:36:25 PM Number of Addenda: 0

## 2020-10-03 NOTE — Anesthesia Procedure Notes (Signed)
Procedure Name: Intubation Performed by: Trevonne Nyland, CRNA Pre-anesthesia Checklist: Patient identified, Emergency Drugs available, Suction available and Patient being monitored Patient Re-evaluated:Patient Re-evaluated prior to induction Oxygen Delivery Method: Circle system utilized Preoxygenation: Pre-oxygenation with 100% oxygen Induction Type: IV induction Ventilation: Mask ventilation without difficulty Laryngoscope Size: Miller and 2 Grade View: Grade I Tube type: Oral Tube size: 7.0 mm Number of attempts: 1 Airway Equipment and Method: Stylet and Oral airway Placement Confirmation: ETT inserted through vocal cords under direct vision,  positive ETCO2 and breath sounds checked- equal and bilateral Secured at: 21 cm Tube secured with: Tape Dental Injury: Teeth and Oropharynx as per pre-operative assessment        

## 2020-10-03 NOTE — Transfer of Care (Signed)
Immediate Anesthesia Transfer of Care Note  Patient: Tricia Brown  Procedure(s) Performed: ESOPHAGOGASTRODUODENOSCOPY (EGD) WITH PROPOFOL (N/A ) UPPER ESOPHAGEAL ENDOSCOPIC ULTRASOUND (EUS) (N/A ) ENDOSCOPIC RETROGRADE CHOLANGIOPANCREATOGRAPHY (ERCP) (N/A ) BIOPSY SPHINCTEROTOMY REMOVAL OF STONES PANCREATIC STENT PLACEMENT  Patient Location: PACU  Anesthesia Type:General  Level of Consciousness: awake, alert , oriented and patient cooperative  Airway & Oxygen Therapy: Patient Spontanous Breathing and Patient connected to face mask oxygen  Post-op Assessment: Report given to RN and Post -op Vital signs reviewed and stable  Post vital signs: Reviewed and stable  Last Vitals:  Vitals Value Taken Time  BP 140/65 10/03/20 1241  Temp 36.9 C 10/03/20 1241  Pulse 107 10/03/20 1251  Resp 10 10/03/20 1251  SpO2 98 % 10/03/20 1251  Vitals shown include unvalidated device data.  Last Pain:  Vitals:   10/03/20 1241  TempSrc: Temporal  PainSc: 6       Patients Stated Pain Goal: 4 (10/03/20 0802)  Complications: No complications documented.

## 2020-10-03 NOTE — Anesthesia Postprocedure Evaluation (Signed)
Anesthesia Post Note  Patient: Camara Renstrom  Procedure(s) Performed: ESOPHAGOGASTRODUODENOSCOPY (EGD) WITH PROPOFOL (N/A ) UPPER ESOPHAGEAL ENDOSCOPIC ULTRASOUND (EUS) (N/A ) ENDOSCOPIC RETROGRADE CHOLANGIOPANCREATOGRAPHY (ERCP) (N/A ) BIOPSY SPHINCTEROTOMY REMOVAL OF STONES PANCREATIC STENT PLACEMENT     Patient location during evaluation: PACU Anesthesia Type: General Level of consciousness: awake and alert and oriented Pain management: pain level controlled Vital Signs Assessment: post-procedure vital signs reviewed and stable Respiratory status: spontaneous breathing, nonlabored ventilation and respiratory function stable Cardiovascular status: blood pressure returned to baseline and stable Postop Assessment: no apparent nausea or vomiting Anesthetic complications: no   No complications documented.  Last Vitals:  Vitals:   10/03/20 1301 10/03/20 1311  BP: 137/73 138/72  Pulse: 96 94  Resp: 12 13  Temp:    SpO2: 100% 100%    Last Pain:  Vitals:   10/03/20 1311  TempSrc:   PainSc: 2                  Pleasant Bensinger A.

## 2020-10-03 NOTE — Op Note (Signed)
[facility] Patient Name: Tricia Brown Procedure Date : 10/03/2020 MRN: [id number] Attending MD: Corliss ParishGabriel Mansouraty , MD Date of Birth: June 25, 1976 CSN: [id number] Age: [age] Admit Type: Inpatient Procedure:                ERCP Indications:              Acute recurrent pancreatitis, Follow-up of acute                            recurrent pancreatitis Providers:                Corliss ParishGabriel Mansouraty, MD, Estella HuskJarmila Fucs RN, RN,                            Lawson Radararlene Davis, Technician, Ponciano OrtGrace Brewer, CRNA Referring MD:             Vida RiggerMarc Magod, MD, [facility] Medicines:                General Anesthesia, Cipro 400 mg IV, Indomethacin                            100 mg PR, Glucagon 0.5 mg IV Complications:            No immediate complications. Estimated Blood Loss:     Estimated blood loss was minimal. Procedure:                Pre-Anesthesia Assessment:                           - Prior to the procedure, a History and Physical                            was performed, and patient medications and                            allergies were reviewed. The patient's tolerance of                            previous anesthesia was also reviewed. The risks                            and benefits of the procedure and the sedation                            options and risks were discussed with the patient.                            All questions were answered, and informed consent                            was obtained. Prior Anticoagulants: The patient has                            taken no previous anticoagulant or antiplatelet  agents. ASA Grade Assessment: III - A patient with                            severe systemic disease. After reviewing the risks                            and benefits, the patient was deemed in                            satisfactory condition to undergo the procedure.                           After obtaining informed consent, the  scope was                            passed under direct vision. Throughout the                            procedure, the patient's blood pressure, pulse, and                            oxygen saturations were monitored continuously. The                            TJF- Q180V ([id number]) Olympus duodenoscope was                            introduced through the mouth, and used to inject                            contrast into and used to inject contrast into the                            bile duct and ventral pancreatic duct. The GIF-H190                            ([id number] Olympus gastroscope was introduced                            through the and used to inject contrast into. The                            GF-UCT180 ([id number] Olympus Linear EUS scope was                            introduced through the and used to inject contrast                            into. The ERCP was accomplished without difficulty.                            The patient tolerated the procedure. Scope In: Scope Out: Findings:      A scout film of  the abdomen was obtained. Surgical clips, consistent       with a previous cholecystectomy, were seen in the area of the right       upper quadrant of the abdomen.      The esophagus was successfully intubated under direct vision without       detailed examination of the pharynx, larynx, and associated structures.       A standard esophagogastroduodenoscopy scope was used for the examination       of the upper gastrointestinal tract. The scope was passed under direct       vision through the upper GI tract. No gross lesions were noted in the       entire esophagus. The Z-line was irregular and was found 41 cm from the       incisors. Patchy mild inflammation characterized by erosions and       erythema was found in the gastric body and in the gastric antrum - this       was biopsied. Patchy mildly erythematous mucosa without active bleeding       and with no  stigmata of bleeding was found in the duodenal bulb, in the       first portion of the duodenum and in the second portion of the duodenum       - this was biopied. The major papilla was flat but otherwise normal.      Initially I wanted to proceed with attempt at PD acces, but on first       attempt a short 0.035 inch Soft Al Pimple was passed into the biliary tree       and it was maintained. The Autotome sphincterotome was passed over the       guidewire and the bile duct was then deeply cannulated. Contrast was       injected. I personally interpreted the bile duct images. Ductal flow of       contrast was adequate. Image quality was adequate. Contrast extended to       the hepatic ducts. Opacification of the entire biliary tree except for       the cystic duct and gallbladder was successful. The maximum diameter of       the ducts was 7 mm. A cholecystectomy had been performed. No other       significant findings were noted.      Using a double-wire technique, a second short 0.035 inch Soft Jagwire       was passed into the ventral pancreatic duct after seeing orifice was       separate from CBD orifice. The ventral pancreatic duct was then deeply       cannulated with the Autotome sphincterotome. Contrast was injected. I       personally interpreted the pancreatic duct images. Ductal flow of       contrast was adequate. Image quality was adequate. Contrast extended to       the pancreatic duct. Opacification of the entire pancreatic ductal       system was successful. The maximum diameter of the ducts was 1 mm. There       was no extravasation of contrast in the entire opacified area. At one       point I thought I was in a side-branch and was able to manipulate the       wire into an area felt to be the distal body vs tail region. Further  injection did not lead to any evidence of extravasation.      A 4 mm biliary sphincterotomy was made with a monofilament Autotome        sphincterotome using ERBE electrocautery due to how flat the biliary       orifice was. There was no post-sphincterotomy bleeding.      A 4 mm ventral pancreatic sphincterotomy was made with a monofilament       Autotome sphincterotome using ERBE electrocautery. There was no       post-sphincterotomy bleeding.      To discover objects, the biliary tree was swept with a retrieval balloon       starting at the bifurcation. Some sludge was swept from the duct but no       overt stones. An occlusion cholangiogram was performed that showed no       further significant biliary pathology.      Even though no pancreatic leak was noted, this had been a concern, one 5       Fr by 7 cm plastic pancreatic stent with two external flaps and a single       internal flap was placed into the ventral pancreatic duct. Clear fluid       flowed through the stent. The stent was in good position.      The duodenoscope was withdrawn from the patient. Impression:               - No gross lesions in esophagus. Z-line irregular,                            41 cm from the incisors.                           - Gastritis. Biopsied.                           - Erythematous duodenopathy. Biopsied.                           - The major papilla appeared normal but was flat.                            It turned out that the two orifice were separated                            (biliary and pancreatic).                           - The patient has had a cholecystectomy.                           - A biliary sphincterotomy was performed.                           - A pancreatic sphincterotomy was performed.                           - The biliary tree was swept and sludge was found  but no stones.                           - No evidence of pancreatic duct abnormality or                            overt extravasation was noted but due to prior                            concern and for PEP, a  pancreatic stent was placed                            into the ventral pancreatic duct. This will need                            removal or exchange in future. Recommendation:           - Proceed to scheduled EUS.                           - Observe patient's clinical course.                           - Await path results.                           - Watch for pancreatitis, bleeding, perforation,                            and cholangitis.                           - Return for stent exchange at ERCP in 2-3 months                            (this will be performed by Dr. Ewing Schlein or myself                            based on how you are progressing).                           - The findings and recommendations were discussed                            with the patient.                           - The findings and recommendations were discussed                            with the patient's family.                           - The findings and recommendations were discussed  with the referring physician. Procedure Code(s):        --- Professional ---                           [phone], Endoscopic retrograde                            cholangiopancreatography (ERCP); with placement of                            endoscopic stent into biliary or pancreatic duct,                            including pre- and post-dilation and guide wire                            passage, when performed, including sphincterotomy,                            when performed, each stent                           43264, Endoscopic retrograde                            cholangiopancreatography (ERCP); with removal of                            calculi/debris from biliary/pancreatic duct(s)                           43239, Esophagogastroduodenoscopy, flexible,                            transoral; with biopsy, single or multiple Diagnosis Code(s):        --- Professional ---                            K22.8, Other specified diseases of esophagus                           K29.70, Gastritis, unspecified, without bleeding                           K31.89, Other diseases of stomach and duodenum                           Z90.49, Acquired absence of other specified parts                            of digestive tract                           K85.90, Acute pancreatitis without necrosis or                            infection, unspecified CPT copyright 2019 American Medical  Association. All rights reserved. The codes documented in this report are preliminary and upon coder review may  be revised to meet current compliance requirements. Corliss Parish, MD 10/03/2020 1:15:20 PM Number of Addenda: 0

## 2020-10-03 NOTE — Anesthesia Preprocedure Evaluation (Addendum)
Anesthesia Evaluation  Patient identified by MRN, date of birth, ID band Patient awake    Reviewed: Allergy & Precautions, NPO status , Patient's Chart, lab work & pertinent test results  Airway Mallampati: II  TM Distance: >3 FB Neck ROM: Full    Dental  (+) Edentulous Upper, Edentulous Lower, Upper Dentures, Lower Dentures   Pulmonary neg pulmonary ROS, Current Smoker and Patient abstained from smoking., former smoker,    Pulmonary exam normal breath sounds clear to auscultation       Cardiovascular negative cardio ROS Normal cardiovascular exam Rhythm:Regular Rate:Normal  EKG 09/27/20 NSR   Neuro/Psych  Headaches, PSYCHIATRIC DISORDERS Depression    GI/Hepatic GERD  Medicated and Controlled,(+)     substance abuse  , Pancreatic pseudocyst Acute on chronic pancreatitis   Endo/Other  negative endocrine ROS  Renal/GU Renal diseaseHx/o AKI, currently normal renal function  negative genitourinary   Musculoskeletal negative musculoskeletal ROS (+) narcotic dependent  Abdominal   Peds  Hematology  (+) anemia ,   Anesthesia Other Findings   Reproductive/Obstetrics                            Anesthesia Physical  Anesthesia Plan  ASA: III  Anesthesia Plan: General   Post-op Pain Management:    Induction: Intravenous  PONV Risk Score and Plan: 1 and Propofol infusion and Treatment may vary due to age or medical condition  Airway Management Planned: Oral ETT  Additional Equipment:   Intra-op Plan:   Post-operative Plan:   Informed Consent: I have reviewed the patients History and Physical, chart, labs and discussed the procedure including the risks, benefits and alternatives for the proposed anesthesia with the patient or authorized representative who has indicated his/her understanding and acceptance.       Plan Discussed with: CRNA and Anesthesiologist  Anesthesia Plan  Comments:         Anesthesia Quick Evaluation

## 2020-10-04 LAB — CBC WITH DIFFERENTIAL/PLATELET
Abs Immature Granulocytes: 0.09 10*3/uL — ABNORMAL HIGH (ref 0.00–0.07)
Basophils Absolute: 0 10*3/uL (ref 0.0–0.1)
Basophils Relative: 0 %
Eosinophils Absolute: 0 10*3/uL (ref 0.0–0.5)
Eosinophils Relative: 0 %
HCT: 39.7 % (ref 36.0–46.0)
Hemoglobin: 12.7 g/dL (ref 12.0–15.0)
Immature Granulocytes: 1 %
Lymphocytes Relative: 10 %
Lymphs Abs: 1.8 10*3/uL (ref 0.7–4.0)
MCH: 29.6 pg (ref 26.0–34.0)
MCHC: 32 g/dL (ref 30.0–36.0)
MCV: 92.5 fL (ref 80.0–100.0)
Monocytes Absolute: 1.1 10*3/uL — ABNORMAL HIGH (ref 0.1–1.0)
Monocytes Relative: 6 %
Neutro Abs: 14.8 10*3/uL — ABNORMAL HIGH (ref 1.7–7.7)
Neutrophils Relative %: 83 %
Platelets: 374 10*3/uL (ref 150–400)
RBC: 4.29 MIL/uL (ref 3.87–5.11)
RDW: 13.9 % (ref 11.5–15.5)
WBC: 17.7 10*3/uL — ABNORMAL HIGH (ref 4.0–10.5)
nRBC: 0 % (ref 0.0–0.2)

## 2020-10-04 LAB — HEPATIC FUNCTION PANEL
ALT: 47 U/L — ABNORMAL HIGH (ref 0–44)
AST: 22 U/L (ref 15–41)
Albumin: 3.4 g/dL — ABNORMAL LOW (ref 3.5–5.0)
Alkaline Phosphatase: 131 U/L — ABNORMAL HIGH (ref 38–126)
Bilirubin, Direct: <0.1 mg/dL (ref 0.0–0.2)
Total Bilirubin: 0.3 mg/dL (ref 0.3–1.2)
Total Protein: 6.4 g/dL — ABNORMAL LOW (ref 6.5–8.1)

## 2020-10-04 LAB — GLUCOSE, CAPILLARY
Glucose-Capillary: 126 mg/dL — ABNORMAL HIGH (ref 70–99)
Glucose-Capillary: 135 mg/dL — ABNORMAL HIGH (ref 70–99)
Glucose-Capillary: 183 mg/dL — ABNORMAL HIGH (ref 70–99)
Glucose-Capillary: 83 mg/dL (ref 70–99)

## 2020-10-04 LAB — SURGICAL PATHOLOGY

## 2020-10-04 MED ORDER — TRACE MINERALS CU-MN-SE-ZN 300-55-60-3000 MCG/ML IV SOLN
INTRAVENOUS | Status: AC
  Filled 2020-10-04: qty 704

## 2020-10-04 NOTE — Progress Notes (Addendum)
[facility] Gastroenterology Progress Note  Tricia Brown 45 y.o. Mar 03, 1976   Subjective: S/P ERCP/EUS yesterday afternoon. Had lightheadedness and saw black spots yesterday evening that resolved and has not returned. Reports severe abdominal pain early this morning that was improved after pain meds. Nausea without vomiting. No BMs. Lying in bed almost tearful.  Objective: Vital signs: Vitals:   10/03/20 2017 10/04/20 0401  BP: 137/77 135/77  Pulse: 98 90  Resp: 18   Temp: 98.3 F (36.8 C) 98.6 F (37 C)  SpO2: 100% 100%    Physical Exam: Gen: lethargic, thin, mild acute distress  HEENT: anicteric sclera CV: RRR Chest: CTA B Abd: diffuse tenderness with guarding to light palpation, nondistended, +BS Ext: no edema  Lab Results: Recent Labs    10/03/20 0312  NA 140  K 4.3  CL 103  CO2 28  GLUCOSE 99  BUN 17  CREATININE 0.64  CALCIUM 9.2  MG 1.9  PHOS 5.4*   Recent Labs    10/03/20 0312  AST 16  ALT 25  ALKPHOS 131*  BILITOT 0.3  PROT 5.9*  ALBUMIN 3.3*   No results for input(s): WBC, NEUTROABS, HGB, HCT, MCV, PLT in the last 72 hours.    Assessment/Plan: Acute complicated pancreatitis with pseudocyst formation - s/p ERCP/EUS yesterday by Dr. Meridee Brown with pancreatic duct stent placement, biliary and pancreatic duct sphincterotomies, and celiac plexus block. Aspiration of pseudocyst in the pancreatic tail not done due to multiple septations and lack of evidence that it is infected. Hopefully celiac plexus block will help with her ongoing abdominal pain. PD stent in place and will need to be removed or exchanged in the future. Greatly appreciate Dr. Meridee Brown doing her procedure. Appreciate Dr. Marvell Brown help when called yesterday. Continue TNA and supportive care. Check CBC and HFP. If no improvement in abd pain over the next few weeks then will need to re-visit a pancreatic surgical opinion at a tertiary care facility. Dr. Marca Brown on call this weekend and will f/u.     Tricia Brown 10/04/2020, 9:11 AM  Questions please call [phone] ID: Tricia Brown, female   DOB: [birth year], 45 y.o.   MRN: [id number]

## 2020-10-04 NOTE — Progress Notes (Signed)
PROGRESS NOTE    Tricia Brown  XNA:[id number] DOB: 06-06-1976 DOA: 09/25/2020 PCP: Tricia Croon, MD   Brief Narrative:  Tricia Brown is a 45 y.o. female with medical history significant of chronic pain, on methadone; and depression presenting with worsening abdominal pain.  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  She was most recently treated with Meropenem and MS Contin with breakthrough Oxy IR.  She was discharged home with TPN.  Since d/c, she has been taking TPN IV x 18 hours a day.  She is also on Carafate QID, Protonix once daily, Zofran TID, Bentyl, and pancreatic enzymes BID.  She is also on binder BID to help with diarrhea.  She started with temp to 99 -> 100 over the last few days.  She was no longer able to keep ginger ale and broth down.  She was having trouble keeping her pills down too.  They are trying to get her referred to [facility].  The pain has been gradually worsening for about a week.  Her pain medications are not keeping the pain at bay.  She finished her antibiotic during her last hospitalization.  Upon arrival to ED, she was hemodynamically stable and afebrile. CT with acute non-necrotizing pancreatitis with peripancreatitis fluid collection.  She was admitted to hospitalist service and GI was consulted.  MRCP was done which showed enlarging cyst at the tail of the pancreas.  Unfortunately only Dr. Meridee Score of [facility] is capable of draining the cyst and he is not available this week.  GI had arranged this procedure at [facility] for the patient on 10/01/2020 however per nursing staff, neither [facility] nor [facility] was able to transport her there and wait two and half hours.  They were only able to transport one way.  For this reason, patient could not go for the procedure. Eventually Dr. Meridee Score had some opening so she underwent pancreatic  stent placement and celiac nerve block on 10/03/2020.  Assessment & Plan:   Principal Problem:   Acute pancreatitis Active Problems:   Depression   Opioid use disorder, severe, dependence (HCC)   Acute/recurrent pancreatitis/chronic pain -Patient with severe necrotizing pancreatitis, with 2 prior prolonged hospitalizations including cystogastrostomy with stent placement and then removal -Now presentied with recurrent pain, n/v. Seen by GI. They are considering for another stent.  MRCP shows enlarging cyst at the tail of the pancreas.  Unfortunately only Dr. Meridee Score of [facility] is capable of draining the cyst and he is not available this week.  GI had arranged this procedure at [facility] for the patient on 10/01/2020 however per nursing staff, neither [facility] nor [facility] was able to transport her there and wait two and half hours.  They were only able to transport one way.  For this reason, patient could not go for the procedure.  However, Dr. Meridee Score had some availability opened up and patient underwent pancreatic stent and celiac nerve block yesterday. This morning, her pain is slightly better but with the pain medications. Her nausea is improved. She has no other complaint at this point in time. Reviewed Dr. Marvell Fuller note from yesterday. It is to be noted that patient's primary nurse Tricia Brown) had reached out to me stating that patient was having some numbness in the lower lip. I was not informed of any other symptoms such as lightheadedness or seeing black spots. Due to my unfamiliarity with the side effects of  the block that she had by GI doctor, primary nurse was asked to call GI. Patient this morning tells me that she had some lightheadedness, some black spots and lower lip numbness which lasted for few hours but have resolved now. Further management per GI. Continue current pain management.  Hypokalemia/hypomagnesemia: Resolved  COPD: Not in exacerbation. -Continue Symbicort  DVT  prophylaxis: enoxaparin (LOVENOX) injection 40 mg Start: 10/05/20 0000   Code Status: Full Code  Family Communication: None present at bedside.  Plan of care discussed with patient in length and he verbalized understanding and agreed with it.  Status is: Inpatient  Remains inpatient appropriate because:Inpatient level of care appropriate due to severity of illness   Dispo: The patient is from: Home              Anticipated d/c is to: Home              Anticipated d/c date is: 2 days              Patient currently is not medically stable to d/c.        Estimated body mass index is 21.14 kg/m as calculated from the following:   Height as of this encounter: 5\' 7"  (1.702 m).   Weight as of this encounter: 61.2 kg.      Nutritional status:  Nutrition Problem: Increased nutrient needs Etiology: chronic illness (pancreatitis)   Signs/Symptoms: estimated needs   Interventions: Boost Breeze,TPN    Consultants:   GI  Procedures:   As above  Antimicrobials:  Anti-infectives (From admission, onward)   Start     Dose/Rate Route Frequency Ordered Stop   10/04/20 0000  ciprofloxacin (CIPRO) IVPB 400 mg        400 mg 200 mL/hr over 60 Minutes Intravenous Every 12 hours 10/03/20 1335 10/06/20 2359         Subjective: Patient seen and examined. She states that her pain is slightly better only after she received the pain medication but her nausea has improved. No other complaint. She is still in tears  Objective: Vitals:   10/03/20 1437 10/03/20 2017 10/04/20 0401 10/04/20 1000  BP: 135/82 137/77 135/77 (!) 150/53  Pulse: 90 98 90 96  Resp: 16 18    Temp: 98 F (36.7 C) 98.3 F (36.8 C) 98.6 F (37 C) 97.6 F (36.4 C)  TempSrc: Oral Oral Oral Oral  SpO2: 100% 100% 100% 100%  Weight:      Height:        Intake/Output Summary (Last 24 hours) at 10/04/2020 1229 Last data filed at 10/04/2020 0700 Gross per 24 hour  Intake 3712.7 ml  Output --  Net 3712.7 ml    Filed Weights   09/27/20 0900 10/03/20 0928  Weight: 61.2 kg 61.2 kg    Examination:  General exam: Appears calm and comfortable  Respiratory system: Clear to auscultation. Respiratory effort normal. Cardiovascular system: S1 & S2 heard, RRR. No JVD, murmurs, rubs, gallops or clicks. No pedal edema. Gastrointestinal system: Abdomen is nondistended, soft and severe upper abdominal tenderness. No organomegaly or masses felt. Normal bowel sounds heard. Central nervous system: Alert and oriented. No focal neurological deficits. Extremities: Symmetric 5 x 5 power. Skin: No rashes, lesions or ulcers.  Psychiatry: Judgement and insight appear normal. Mood & affect appropriate.    Data Reviewed: I have personally reviewed following labs and imaging studies  CBC: Recent Labs  Lab 09/28/20 0346 09/30/20 0352  WBC 5.2 5.6  NEUTROABS 2.5  3.0  HGB 10.6* 12.0  HCT 32.6* 35.2*  MCV 92.4 91.7  PLT 229 249   Basic Metabolic Panel: Recent Labs  Lab 09/28/20 0346 09/29/20 0323 09/30/20 0352 10/01/20 0415 10/03/20 0312  NA 142 142 142 139 140  K 3.3* 2.9* 3.5 3.9 4.3  CL 107 106 106 103 103  CO2 25 29 29 28 28   GLUCOSE 132* 118* 103* 104* 99  BUN 13 10 12 15 17   CREATININE 0.63 0.60 0.63 0.64 0.64  CALCIUM 8.4* 8.8* 9.2 9.1 9.2  MG 1.5* 1.7 1.7 2.0 1.9  PHOS 3.9 4.0 3.5 4.8* 5.4*   GFR: Estimated Creatinine Clearance: 86.7 mL/min (by C-G formula based on SCr of 0.64 mg/dL). Liver Function Tests: Recent Labs  Lab 09/28/20 0346 09/29/20 0323 09/30/20 0352 10/03/20 0312  AST 19 21 17 16   ALT 25 31 27 25   ALKPHOS 109 109 117 131*  BILITOT 0.5 0.2* 0.4 0.3  PROT 5.5* 5.8* 6.2* 5.9*  ALBUMIN 3.0* 3.0* 3.4* 3.3*   No results for input(s): LIPASE, AMYLASE in the last 168 hours. No results for input(s): AMMONIA in the last 168 hours. Coagulation Profile: No results for input(s): INR, PROTIME in the last 168 hours. Cardiac Enzymes: No results for input(s): CKTOTAL, CKMB,  CKMBINDEX, TROPONINI in the last 168 hours. BNP (last 3 results) No results for input(s): PROBNP in the last 8760 hours. HbA1C: No results for input(s): HGBA1C in the last 72 hours. CBG: Recent Labs  Lab 10/02/20 1447 10/02/20 2153 10/03/20 0559 10/04/20 0004 10/04/20 0756  GLUCAP 90 117* 113* 183* 126*   Lipid Profile: Recent Labs    10/03/20 0312  TRIG 313*   Thyroid Function Tests: No results for input(s): TSH, T4TOTAL, FREET4, T3FREE, THYROIDAB in the last 72 hours. Anemia Panel: No results for input(s): VITAMINB12, FOLATE, FERRITIN, TIBC, IRON, RETICCTPCT in the last 72 hours. Sepsis Labs: No results for input(s): PROCALCITON, LATICACIDVEN in the last 168 hours.  Recent Results (from the past 240 hour(s))  Resp Panel by RT-PCR (Flu A&B, Covid) Nasopharyngeal Swab     Status: None   Collection Time: 09/26/20 12:52 PM   Specimen: Nasopharyngeal Swab; Nasopharyngeal(NP) swabs in vial transport medium  Result Value Ref Range Status   SARS Coronavirus 2 by RT PCR NEGATIVE NEGATIVE Final    Comment: (NOTE) SARS-CoV-2 target nucleic acids are NOT DETECTED.  The SARS-CoV-2 RNA is generally detectable in upper respiratory specimens during the acute phase of infection. The lowest concentration of SARS-CoV-2 viral copies this assay can detect is 138 copies/mL. A negative result does not preclude SARS-Cov-2 infection and should not be used as the sole basis for treatment or other patient management decisions. A negative result may occur with  improper specimen collection/handling, submission of specimen other than nasopharyngeal swab, presence of viral mutation(s) within the areas targeted by this assay, and inadequate number of viral copies(<138 copies/mL). A negative result must be combined with clinical observations, patient history, and epidemiological information. The expected result is Negative.  Fact Sheet for Patients:   BloggerCourse.comhttps://www.fda.gov/media/152166/download  Fact Sheet for Healthcare Providers:  SeriousBroker.ithttps://www.fda.gov/media/152162/download  This test is no t yet approved or cleared by the [place] FDA and  has been authorized for detection and/or diagnosis of SARS-CoV-2 by FDA under an Emergency Use Authorization (EUA). This EUA will remain  in effect (meaning this test can be used) for the duration of the COVID-19 declaration under Section 564(b)(1) of the Act, 21 U.S.C.section 360bbb-3(b)(1), unless the authorization is terminated  or revoked sooner.       Influenza A by PCR NEGATIVE NEGATIVE Final   Influenza B by PCR NEGATIVE NEGATIVE Final    Comment: (NOTE) The Xpert Xpress SARS-CoV-2/FLU/RSV plus assay is intended as an aid in the diagnosis of influenza from Nasopharyngeal swab specimens and should not be used as a sole basis for treatment. Nasal washings and aspirates are unacceptable for Xpert Xpress SARS-CoV-2/FLU/RSV testing.  Fact Sheet for Patients: BloggerCourse.com  Fact Sheet for Healthcare Providers: SeriousBroker.it  This test is not yet approved or cleared by the [place] FDA and has been authorized for detection and/or diagnosis of SARS-CoV-2 by FDA under an Emergency Use Authorization (EUA). This EUA will remain in effect (meaning this test can be used) for the duration of the COVID-19 declaration under Section 564(b)(1) of the Act, 21 U.S.C. section 360bbb-3(b)(1), unless the authorization is terminated or revoked.  Performed at [facility], [address] [place], [place] [address]       Radiology Studies: DG ERCP  Result Date: 10/03/2020 CLINICAL DATA:  ERCP. EXAM: ERCP TECHNIQUE: Multiple spot images obtained with the fluoroscopic device and submitted for interpretation post-procedure. COMPARISON:  MRCP-09/28/2020 FLUOROSCOPY TIME:  5 minutes, 21 seconds (43 mGy) FINDINGS: Eleven spot  fluoroscopic images of the right upper abdominal quadrant during ERCP are provided for review Initial image demonstrates an ERCP probe overlying the right upper abdominal quadrant. There is selective cannulation opacification of the CBD which appears nondilated. Cholecystectomy clips overlies expected location of the gallbladder fossa. Subsequent images demonstrate selective cannulation and opacification of the pancreatic duct which appears nondilated. Ultimately, there is placement of a pancreatic stent Subsequent images demonstrate insufflation of a balloon within the distal aspect of the CBD with subsequent biliary sweeping and presumed sphincterotomy. There is minimal opacification intrahepatic biliary tree which appears nondilated. There is no opacification of the residual cystic duct. IMPRESSION: ERCP with pancreatic stent placement and CBD balloon sweeping and presumed sphincterotomy. These images were submitted for radiologic interpretation only. Please see the procedural report for the amount of contrast and the fluoroscopy time utilized. Electronically Signed   By: Simonne Come M.D.   On: 10/03/2020 12:24    Scheduled Meds: . Chlorhexidine Gluconate Cloth  6 each Topical Daily  . colestipol  1 g Oral BID  . [START ON 10/05/2020] enoxaparin (LOVENOX) injection  40 mg Subcutaneous Q24H  . feeding supplement  1 Container Oral TID BM  . lipase/protease/amylase  72,000 Units Oral TID WC  . montelukast  10 mg Oral Daily  . pantoprazole (PROTONIX) IV  40 mg Intravenous Q12H  . sodium chloride flush  10-40 mL Intracatheter Q12H  . sucralfate  1 g Oral QID   Continuous Infusions: . ciprofloxacin Stopped (10/04/20 0035)  . lactated ringers 100 mL/hr at 10/04/20 1038  . TPN CYCLIC-ADULT (ION) 101 mL/hr at 10/04/20 0700  . TPN CYCLIC-ADULT (ION)       LOS: 8 days   Time spent: 27 minutes   Hughie Closs, MD [facility]  10/04/2020, 12:29 PM   To contact the attending provider between  7A-7P or the covering provider during after hours 7P-7A, please log into the web site [email or web address].

## 2020-10-04 NOTE — Progress Notes (Signed)
PHARMACY - TOTAL PARENTERAL NUTRITION CONSULT NOTE  Indication: Complicated pancreatitis with loculated pseudocysts  Patient Measurements: Height: 5\' 7"  (170.2 cm) Weight: 58.4 kg (128 lb 12 oz) IBW/kg (Calculated) : 61.6 TPN AdjBW (KG): 58.4 Body mass index is 20.16 kg/m.  Assessment:  39 YOF recently admitted 10/20-11/17 with necrotizing pancreatitis with infected pseudocyst s/p cystgastrostomy and then stent removal.  Patient readmitted 11/25-12/8/21 with abdominal pain and progressive phlegmon in the LUQ.  Returned 09/26/20 with worsening abdominal pain and decreased PO intake.  Pharmacy consulted to continue TPN from [facility] for recurrent pancreatitis.   Patient reports weighing ~150lbs early 2021, then lost weight with pancreatitis and was as low as 125 lb.  Since homeTPN started late 2021, she gained back some weight and has been maintaining at 135 lbs.  Patient remains on an 18-hr cycle TPN since discharge in Dec and has been on a liquid diet.  Any solid food would cause abdominal pain.  She missed 2 doses of TPN while holding in the ED.  Now with enlarging pancreatic pseudocyst. Now plan EUS with pancreatic cyst drainage/aspiration and possible pancreatic duct stent at [facility] today 1/13. - Still c/o nausea, abdominal pain, and GERD. High dose opiates.  Glucose / Insulin: no hx DM - hypoglycemic off TPN.  [facility] D/C'ed 10/01/19. Measured glucose 113 (1/13) Electrolytes: K 4.3. Phos up to 5.4 despite minimal amount in TPN. Mg 1.9 ok. None new on 1/14. Renal: SCr < 1, BUN WNL LFTs / TGs: LFTs / tbili WNL, TG 69 > 171>313 Prealbumin / albumin: albumin up to 3.3, prealbumin WNL at 28.3, lipase up to 138 (1/5) Intake / Output; MIVF: LBM 1/13 (high dose opiates), LR at 200 ml/hr, no I/O's recorded GI Imaging:  1/6 CT: acute non-necrotizing pancreatitis, peripancreatic fluid collection and developing pseudocyst 1/8 MRCP - pancreatic cyst, mildly increased in size GI meds: Colestipol for diarrhea,  Creon TID, IV PPI q12, Sucralfate QID Surgeries / Procedures:  10/03/20: EGD, upper EUS,ERCP, sphincterotomy, removal of stones, pancreatic stent placement.  Central access: PICC placed 08/23/20 TPN start date: chronic/home  Nutritional Goals (per RD rec on 1/7): 1950-2150 kCal, 105-120g protein, >1.9L fluid per day  TPN formula from [facility]:  [facility] over 18 hrs:  102g AA, 260g CHO and 40g ILE for a total of 1692 kCal per day Lytes: increasing K needs, mag 66mEq/d  Current Nutrition:  TPN Soft diet: 427 charted Boost PO TID - only 1 charted given 1/13 due to surgery  Plan:  TPN cycle at 20 hrs to help with hypoglycemia while off TPN Continue cyclic TPN, infuse 1920 ml over 20 hrs: 51 ml/hr on first and last hr, 101 ml/hr x 18 hrs TPN provides 105g AA, 384g CHO and 0g ILE for a total of 1728 kCal, meeting 88% of needs - Remove lipids due to some po intake and elevated triglyceride levels with lipid emulsion shortage. Recheck TG on Monday Electrolytes: Completely removed phos 1/13. Add MVI and trace elements in TPN  Continue Q8H CBG checks to monitor for hypoglycemia, esp while off TPN LR at 100 ml/hr per MD Standard TPN labs on Mon/Thurs. BMET and Phos on Saturday  High recommend adding a bowel regimen with high dose opiates.    Mikolaj Woolstenhulme S. Saturday, PharmD, BCPS Clinical Staff Pharmacist [email or web address] 10/04/2020, 10/06/2020

## 2020-10-05 ENCOUNTER — Encounter: Payer: Self-pay | Admitting: Gastroenterology

## 2020-10-05 DIAGNOSIS — K8591 Acute pancreatitis with uninfected necrosis, unspecified: Secondary | ICD-10-CM

## 2020-10-05 LAB — LIPASE, BLOOD: Lipase: 159 U/L — ABNORMAL HIGH (ref 11–51)

## 2020-10-05 LAB — BASIC METABOLIC PANEL
Anion gap: 9 (ref 5–15)
BUN: 20 mg/dL (ref 6–20)
CO2: 24 mmol/L (ref 22–32)
Calcium: 9.4 mg/dL (ref 8.9–10.3)
Chloride: 106 mmol/L (ref 98–111)
Creatinine, Ser: 0.7 mg/dL (ref 0.44–1.00)
GFR, Estimated: >60 mL/min (ref >60)
Glucose, Bld: 103 mg/dL — ABNORMAL HIGH (ref 70–99)
Potassium: 4.6 mmol/L (ref 3.5–5.1)
Sodium: 139 mmol/L (ref 135–145)

## 2020-10-05 LAB — PHOSPHORUS: Phosphorus: 4.3 mg/dL (ref 2.5–4.6)

## 2020-10-05 LAB — GLUCOSE, CAPILLARY
Glucose-Capillary: 118 mg/dL — ABNORMAL HIGH (ref 70–99)
Glucose-Capillary: 99 mg/dL (ref 70–99)
Glucose-Capillary: 108 mg/dL — ABNORMAL HIGH (ref 70–99)

## 2020-10-05 MED ORDER — SUCRALFATE 1 G PO TABS
1.0000 g | ORAL_TABLET | Freq: Two times a day (BID) | ORAL | Status: DC
  Administered 2020-10-05 – 2020-10-09 (×8): 1 g via ORAL
  Filled 2020-10-05 (×8): qty 1

## 2020-10-05 MED ORDER — POLYETHYLENE GLYCOL 3350 17 G PO PACK
17.0000 g | PACK | Freq: Once | ORAL | Status: AC
  Administered 2020-10-05: 17 g via ORAL
  Filled 2020-10-05: qty 1

## 2020-10-05 MED ORDER — TRACE MINERALS CU-MN-SE-ZN 300-55-60-3000 MCG/ML IV SOLN
INTRAVENOUS | Status: AC
  Filled 2020-10-05: qty 704

## 2020-10-05 NOTE — Progress Notes (Signed)
Subjective: Patient was examined on her bedside chair. She reports that upper abdominal pain worsens after eating or with movements. 2 days after ERCP, she had worsening abdominal pain, however, today she reports that abdominal pain has markedly improved, although it remains at a 5 out of 10 in intensity. She used to have a lot of diarrhea, but has not had a bowel movement at least for 7 days.  She reports passing gas.  Objective: Vital signs in last 24 hours: Temp:  [98 F (36.7 C)-99.4 F (37.4 C)] 98.1 F (36.7 C) (01/15 1500) Pulse Rate:  [92-100] 100 (01/15 1500) Resp:  [16-18] 16 (01/15 1500) BP: (117-127)/(68-79) 117/77 (01/15 1500) SpO2:  [98 %-100 %] 100 % (01/15 1500) Weight change: 0.953 kg Last BM Date: 10/03/20  PE: No pallor, no icterus GENERAL: Moist mucous membranes ABDOMEN: Slightly distended, mild tenderness especially in upper abdomen, bowel sounds present EXTREMITIES: No deformity  Lab Results: Results for orders placed or performed during the hospital encounter of 09/25/20 (from the past 48 hour(s))  Glucose, capillary     Status: Abnormal   Collection Time: 10/04/20 12:04 AM  Result Value Ref Range   Glucose-Capillary 183 (H) 70 - 99 mg/dL    Comment: Glucose reference range applies only to samples taken after fasting for at least 8 hours.  Glucose, capillary     Status: Abnormal   Collection Time: 10/04/20  7:56 AM  Result Value Ref Range   Glucose-Capillary 126 (H) 70 - 99 mg/dL    Comment: Glucose reference range applies only to samples taken after fasting for at least 8 hours.  CBC with Differential/Platelet     Status: Abnormal   Collection Time: 10/04/20 12:43 PM  Result Value Ref Range   WBC 17.7 (H) 4.0 - 10.5 K/uL   RBC 4.29 3.87 - 5.11 MIL/uL   Hemoglobin 12.7 12.0 - 15.0 g/dL   HCT 40.8 14.4 - 81.8 %   MCV 92.5 80.0 - 100.0 fL   MCH 29.6 26.0 - 34.0 pg   MCHC 32.0 30.0 - 36.0 g/dL   RDW 56.3 14.9 - 70.2 %   Platelets 374 150 - 400 K/uL    nRBC 0.0 0.0 - 0.2 %   Neutrophils Relative % 83 %   Neutro Abs 14.8 (H) 1.7 - 7.7 K/uL   Lymphocytes Relative 10 %   Lymphs Abs 1.8 0.7 - 4.0 K/uL   Monocytes Relative 6 %   Monocytes Absolute 1.1 (H) 0.1 - 1.0 K/uL   Eosinophils Relative 0 %   Eosinophils Absolute 0.0 0.0 - 0.5 K/uL   Basophils Relative 0 %   Basophils Absolute 0.0 0.0 - 0.1 K/uL   Immature Granulocytes 1 %   Abs Immature Granulocytes 0.09 (H) 0.00 - 0.07 K/uL    Comment: Performed at [facility], [address], [place], [place] [address]  Hepatic function panel     Status: Abnormal   Collection Time: 10/04/20 12:43 PM  Result Value Ref Range   Total Protein 6.4 (L) 6.5 - 8.1 g/dL   Albumin 3.4 (L) 3.5 - 5.0 g/dL   AST 22 15 - 41 U/L   ALT 47 (H) 0 - 44 U/L   Alkaline Phosphatase 131 (H) 38 - 126 U/L   Total Bilirubin 0.3 0.3 - 1.2 mg/dL   Bilirubin, Direct <8.8 0.0 - 0.2 mg/dL   Indirect Bilirubin NOT CALCULATED 0.3 - 0.9 mg/dL    Comment: Performed at [facility], [address] [place], [place] [address]  Glucose, capillary     Status: None   Collection Time: 10/04/20  4:17 PM  Result Value Ref Range   Glucose-Capillary 83 70 - 99 mg/dL    Comment: Glucose reference range applies only to samples taken after fasting for at least 8 hours.  Glucose, capillary     Status: Abnormal   Collection Time: 10/04/20 10:02 PM  Result Value Ref Range   Glucose-Capillary 135 (H) 70 - 99 mg/dL    Comment: Glucose reference range applies only to samples taken after fasting for at least 8 hours.  Basic metabolic panel     Status: Abnormal   Collection Time: 10/05/20  3:12 AM  Result Value Ref Range   Sodium 139 135 - 145 mmol/L   Potassium 4.6 3.5 - 5.1 mmol/L   Chloride 106 98 - 111 mmol/L   CO2 24 22 - 32 mmol/L   Glucose, Bld 103 (H) 70 - 99 mg/dL    Comment: Glucose reference range applies only to samples taken after fasting for at least 8 hours.   BUN 20 6 - 20 mg/dL   Creatinine, Ser 7.62  0.44 - 1.00 mg/dL   Calcium 9.4 8.9 - 83.1 mg/dL   GFR, Estimated >51 >76 mL/min    Comment: (NOTE) Calculated using the CKD-EPI Creatinine Equation (2021)    Anion gap 9 5 - 15    Comment: Performed at [facility], [address] [place], [place] [address]  Phosphorus     Status: None   Collection Time: 10/05/20  3:12 AM  Result Value Ref Range   Phosphorus 4.3 2.5 - 4.6 mg/dL    Comment: Performed at [facility], [address], [place], [place] [address]  Lipase, blood     Status: Abnormal   Collection Time: 10/05/20  3:12 AM  Result Value Ref Range   Lipase 159 (H) 11 - 51 U/L    Comment: Performed at [facility], [address], [place], [place] [address]  Glucose, capillary     Status: Abnormal   Collection Time: 10/05/20  5:52 AM  Result Value Ref Range   Glucose-Capillary 118 (H) 70 - 99 mg/dL    Comment: Glucose reference range applies only to samples taken after fasting for at least 8 hours.  Glucose, capillary     Status: None   Collection Time: 10/05/20  3:03 PM  Result Value Ref Range   Glucose-Capillary 99 70 - 99 mg/dL    Comment: Glucose reference range applies only to samples taken after fasting for at least 8 hours.    Studies/Results: No results found.  Medications: I have reviewed the patient's current medications.  Assessment: Pancreatic fluid collection measuring 7 x 2.4 cm consistent with a pancreatic cyst without evidence of pancreatic edema or acute peripancreatic changes noted on MRCP from 09/28/2020 ERCP by Dr. 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  Continue IV TPN  She has not had a bowel movement for over a week, will DC  colestipol  Continue lactated Ringer's at 100 cc an hour, continue pancreatic enzymes for now along with pantoprazole every 12 hours.  We will decrease sucralfate to 1 g  twice a day, as it may worsen constipation.  Patient is on oxycodone 15 mg 4 times a day for moderate pain and Dilaudid 1 mg IV every 4 hours as needed for severe pain.  Although patient has had significant diarrhea in the past, she has not had a bowel movement for 1 week which could be related to use of narcotics, will give MiraLAX 17 g x 1 dose today.   Kerin Salen, MD 10/05/2020, 3:13 PM

## 2020-10-05 NOTE — Progress Notes (Signed)
[facility]  PROGRESS NOTE  Kemesha Mosey EPP:[id number] DOB: [birth year] DOA: 09/25/2020 PCP: [name], MD   Brief HPI:   45 year old female with medical history of chronic pain on methadone, depression presenting with worsening abdominal pain.  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  She was most recently treated with meropenem and MS Contin and breakthrough Oxy IR.  She was discharged home with TPN.  Since discharge she has been on TPN IV for 18 hours a day.  She is also on Carafate 4 times daily, Protonix once daily, Zofran 3 times daily, Bentyl, pancreatic enzymes twice daily.  She was on medication for diarrhea.  She started with temperature 99 to 100 over the last few days.  She was no longer able to keep ginger ale and broth down.  Patient has been gradually worsening for past 1 week.  Function antibiotic Last hospitalization.  Upon arrival to ED she was hemodynamically stable and afebrile.  CT showed acute nonnecrotizing pancreatitis with peripancreatic fluid collection.  She was admitted to hospitalist service and GI was consulted.  MRCP was done which showed enlarging cyst at the tail of pancreas.  Patient underwent pancreatic stent and celiac nerve block procedure.      Subjective   Patient seen and examined, denies abdominal pain.   Assessment/Plan:     1. Acute/recurrent pancreatitis-patient presented with acute severe nonnecrotizing pancreatitis.  MRCP showed enlarging cyst at tail of pancreas.  Patient underwent ERCP as per [facility] with biliary sphincterotomy and pancreatic stent placement.  Also had celiac plexus nerve block.  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  Continue IV TPN.  Pain control with oxycodone, Dilaudid as needed. 2. COPD-not in exacerbation, continue  Symbicort. 3. Hypokalemia/hypomagnesemia-  resolved     COVID-19 Labs  No results for input(s): DDIMER, FERRITIN, LDH, CRP in the last 72 hours.  Lab Results  Component Value Date   SARSCOV2NAA NEGATIVE 09/26/2020   SARSCOV2NAA NEGATIVE 08/15/2020   SARSCOV2NAA NEGATIVE 07/11/2020   SARSCOV2NAA NEGATIVE 12/14/2019     Scheduled medications:   . Chlorhexidine Gluconate Cloth  6 each Topical Daily  . enoxaparin (LOVENOX) injection  40 mg Subcutaneous Q24H  . feeding supplement  1 Container Oral TID BM  . lipase/protease/amylase  72,000 Units Oral TID WC  . montelukast  10 mg Oral Daily  . pantoprazole (PROTONIX) IV  40 mg Intravenous Q12H  . polyethylene glycol  17 g Oral Once  . sodium chloride flush  10-40 mL Intracatheter Q12H  . sucralfate  1 g Oral BID         CBG: Recent Labs  Lab 10/04/20 0756 10/04/20 1617 10/04/20 2202 10/05/20 0552 10/05/20 1503  GLUCAP 126* 83 135* 118* 99    SpO2: 100 %    CBC: Recent Labs  Lab 09/30/20 0352 10/04/20 1243  WBC 5.6 17.7*  NEUTROABS 3.0 14.8*  HGB 12.0 12.7  HCT 35.2* 39.7  MCV 91.7 92.5  PLT 249 374    Basic Metabolic Panel: Recent Labs  Lab 09/29/20 0323 09/30/20 0352 10/01/20 0415 10/03/20 0312 10/05/20 0312  NA 142 142 139 140 139  K 2.9* 3.5 3.9 4.3 4.6  CL 106 106 103 103 106  CO2 29 29 28 28 24   GLUCOSE 118* 103* 104* 99 103*  BUN 10 12 15 17 20   CREATININE 0.60 0.63 0.64 0.64 0.70  CALCIUM 8.8* 9.2 9.1 9.2 9.4  MG 1.7 1.7 2.0 1.9  --   PHOS 4.0 3.5 4.8* 5.4* 4.3     Liver Function Tests: Recent Labs  Lab 09/29/20 0323 09/30/20 0352 10/03/20 0312 10/04/20 1243  AST 21 17 16 22   ALT 31 27 25  47*  ALKPHOS 109 117 131* 131*  BILITOT 0.2* 0.4 0.3 0.3  PROT 5.8* 6.2* 5.9* 6.4*  ALBUMIN 3.0* 3.4* 3.3* 3.4*     Antibiotics: Anti-infectives (From admission, onward)   Start     Dose/Rate Route Frequency Ordered Stop   10/04/20 0000  ciprofloxacin (CIPRO) IVPB 400 mg         400 mg 200 mL/hr over 60 Minutes Intravenous Every 12 hours 10/03/20 1335 10/06/20 2359       DVT prophylaxis: Lovenox  Code Status: Full code  Family Communication: No family at bedside   Consultants:  Gastroenterology  Procedures:  ERCP, status post biliary sphincterotomy and pancreatic stent placement.  Celiac plexus block performed    Objective   Vitals:   10/04/20 1420 10/04/20 2131 10/05/20 0554 10/05/20 1500  BP: 122/67 119/68 127/79 117/77  Pulse: 95 96 92 100  Resp:  17 18 16   Temp: 98.8 F (37.1 C) 99.4 F (37.4 C) 98 F (36.7 C) 98.1 F (36.7 C)  TempSrc: Oral Oral Oral Oral  SpO2: 100% 98% 100% 100%  Weight: 62.2 kg     Height:        Intake/Output Summary (Last 24 hours) at 10/05/2020 1546 Last data filed at 10/05/2020 0339 Gross per 24 hour  Intake 2992.11 ml  Output --  Net 2992.11 ml    01/13 1901 - 01/15 0700 In: 5769.6 [P.O.:507; I.V.:4662.6] Out: -   Filed Weights   09/27/20 0900 10/03/20 0928 10/04/20 1420  Weight: 61.2 kg 61.2 kg 62.2 kg    Physical Examination:    General-appears in no acute distress  Heart-S1-S2, regular, no murmur auscultated  Lungs-clear to auscultation bilaterally, no wheezing or crackles auscultated  Abdomen-soft, nontender, no organomegaly  Extremities-no edema in the lower extremities  Neuro-alert, oriented x3, no focal deficit noted   Status is: Inpatient  Dispo: The patient is from: Home              Anticipated d/c is to: Home              Anticipated d/c date is: 10/10/2020              Patient currently not stable for discharge  Barrier to discharge-ongoing treatment for pancreatitis      Data Reviewed:   Recent Results (from the past 240 hour(s))  Resp Panel by RT-PCR (Flu A&B, Covid) Nasopharyngeal Swab     Status: None   Collection Time: 09/26/20 12:52 PM   Specimen: Nasopharyngeal Swab; Nasopharyngeal(NP) swabs in vial transport medium  Result Value Ref Range Status   SARS  Coronavirus 2 by RT PCR NEGATIVE NEGATIVE Final    Comment: (NOTE) SARS-CoV-2 target nucleic acids are NOT DETECTED.  The SARS-CoV-2 RNA is generally detectable in upper respiratory specimens during the acute phase of infection. The lowest concentration of SARS-CoV-2 viral copies this assay can detect is 138 copies/mL. A negative result does not preclude SARS-Cov-2 infection and should not be used as the sole basis for treatment or other patient management decisions. A negative result may occur with  improper specimen collection/handling, submission of specimen other than nasopharyngeal swab, presence of viral mutation(s) within the areas  targeted by this assay, and inadequate number of viral copies(<138 copies/mL). A negative result must be combined with clinical observations, patient history, and epidemiological information. The expected result is Negative.  Fact Sheet for Patients:  BloggerCourse.com  Fact Sheet for Healthcare Providers:  SeriousBroker.it  This test is no t yet approved or cleared by the [place] FDA and  has been authorized for detection and/or diagnosis of SARS-CoV-2 by FDA under an Emergency Use Authorization (EUA). This EUA will remain  in effect (meaning this test can be used) for the duration of the COVID-19 declaration under Section 564(b)(1) of the Act, 21 U.S.C.section 360bbb-3(b)(1), unless the authorization is terminated  or revoked sooner.       Influenza A by PCR NEGATIVE NEGATIVE Final   Influenza B by PCR NEGATIVE NEGATIVE Final    Comment: (NOTE) The Xpert Xpress SARS-CoV-2/FLU/RSV plus assay is intended as an aid in the diagnosis of influenza from Nasopharyngeal swab specimens and should not be used as a sole basis for treatment. Nasal washings and aspirates are unacceptable for Xpert Xpress SARS-CoV-2/FLU/RSV testing.  Fact Sheet for  Patients: BloggerCourse.com  Fact Sheet for Healthcare Providers: SeriousBroker.it  This test is not yet approved or cleared by the [place] FDA and has been authorized for detection and/or diagnosis of SARS-CoV-2 by FDA under an Emergency Use Authorization (EUA). This EUA will remain in effect (meaning this test can be used) for the duration of the COVID-19 declaration under Section 564(b)(1) of the Act, 21 U.S.C. section 360bbb-3(b)(1), unless the authorization is terminated or revoked.  Performed at [facility], [address], [place], [place] [address]     Recent Labs  Lab 10/05/20 [phone]  LIPASE 159*        Jastin Fore S [place]   [facility] If 7PM-7AM, please contact night-coverage at [email or web address], Office  [phone]   10/05/2020, 3:46 PM  LOS: 9 days

## 2020-10-05 NOTE — Progress Notes (Signed)
PHARMACY - TOTAL PARENTERAL NUTRITION CONSULT NOTE  Indication: Complicated pancreatitis with loculated pseudocysts  Patient Measurements: Height: _0  (170.2 cm) Weight: 58.4 kg (128 lb 12 oz) IBW/kg (Calculated) : 61.6 TPN AdjBW (KG): 58.4 Body mass index is 20.16 kg/m.  Assessment:  9 YOF recently admitted 10/20-11/17 with necrotizing pancreatitis with infected pseudocyst s/p cystgastrostomy and then stent removal.  Patient readmitted 11/25-12/8/21 with abdominal pain and progressive phlegmon in the LUQ.  Returned 09/26/20 with worsening abdominal pain and decreased PO intake.  Pharmacy consulted to continue TPN from PTA for recurrent pancreatitis.   Patient reports weighing ~150lbs early 2021, then lost weight with pancreatitis and was as low as 125 lb.  Since homeTPN started late 2021, she gained back some weight and has been maintaining at 135 lbs.  Patient remains on an 18-hr cycle TPN since discharge in Dec and has been on a liquid diet.  Any solid food would cause abdominal pain.  She missed 2 doses of TPN while holding in the ED.  Glucose / Insulin: no hx DM.  TPN cycle reduced to 20 hrs on 10/02/19 d/t hypoglycemia off TPN.  [facility] D/C'ed 10/01/19.  CBGs now controlled. Electrolytes: all WNL (K and Phos high normal) Renal: SCr < 1, BUN WNL LFTs / TGs: AST / ALT / tbili WNL, alk phos up to 131, TG 69 > 313 Prealbumin / albumin: albumin up to 3.4, prealbumin WNL at 28.3, lipase up to 159 Intake / Output; MIVF: LBM 1/13 (high dose opiates), LR at 100 ml/hr, no I/O's recorded GI Imaging:  1/6 CT: acute non-necrotizing pancreatitis, peripancreatic fluid collection and developing pseudocyst 1/8 MRCP - pancreatic cyst, mildly increased in size GI meds: Colestipol for diarrhea, Creon TID, IV PPI BID, Sucralfate QID Surgeries / Procedures:  1/13: EGD, upper EUS, ERCP, sphincterotomy, removal of stones, pancreatic stent placement.  Central access: PICC placed 08/23/20 TPN start date:  chronic/home  Nutritional Goals (per RD rec on 1/7): 1950-2150 kCal, 105-120g protein, >1.9L fluid per day  TPN formula from [facility]:  2075m over 18 hrs:  102g AA, 260g CHO and 40g ILE for a total of 1692 kCal per day Lytes: increasing K needs, mag 27m/d  Current Nutrition:  TPN Clear liquid diet Boost PO TID - 3 charted given yesterday  Plan:  Continue cyclic TPN, infuse 198242l over 20 hrs: 51 ml/hr on first and last hr, 101 ml/hr x 18 hrs TPN provides 105g AA, 384g CHO and 0g ILE for a total of 1728 kCal, meeting 88% of needs Lipids removed from TPN on 1/13 due to elevated TG Electrolytes: Na 7511mL, reduce K slightly to 36m11m, Ca 3mEq67m Mag 17mEq74mPhos removed 1/13, Cl:AC 1:1 Add MVI and trace elements in TPN  Continue Q8H CBG checks to monitor for hypoglycemia, especially while off TPN LR at 100 ml/hr per [facility] Standard TPN labs on Mon/Thurs.  F/U TG on 1/17 with plan to add back lipids (likely MWF) Highly recommend adding a bowel regimen with frequent opiate use  Per GI, if no improvement in the next several weeks, may need pancreatic surgery at tertiary center  [facility]ricia [facility] D. Tricia Brown, Tricia MarblemD, Tricia Brown, [facility] Onset2022, 8:10 AM

## 2020-10-06 LAB — GLUCOSE, CAPILLARY
Glucose-Capillary: 120 mg/dL — ABNORMAL HIGH (ref 70–99)
Glucose-Capillary: 85 mg/dL (ref 70–99)

## 2020-10-06 MED ORDER — TRACE MINERALS CU-MN-SE-ZN 300-55-60-3000 MCG/ML IV SOLN
INTRAVENOUS | Status: AC
  Filled 2020-10-06: qty 704

## 2020-10-06 NOTE — Progress Notes (Signed)
[facility]  PROGRESS NOTE  Valley Ke XVQ:[id number] DOB: 1976-07-30 DOA: 09/25/2020 PCP: [name], MD   Brief HPI:   45 year old female with medical history of chronic pain on methadone, depression presenting with worsening abdominal pain.  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  She was most recently treated with meropenem and MS Contin and breakthrough Oxy IR.  She was discharged home with TPN.  Since discharge she has been on TPN IV for 18 hours a day.  She is also on Carafate 4 times daily, Protonix once daily, Zofran 3 times daily, Bentyl, pancreatic enzymes twice daily.  She was on medication for diarrhea.  She started with temperature 99 to 100 over the last few days.  She was no longer able to keep ginger ale and broth down.  Patient has been gradually worsening for past 1 week.  Function antibiotic Last hospitalization.  Upon arrival to ED she was hemodynamically stable and afebrile.  CT showed acute nonnecrotizing pancreatitis with peripancreatic fluid collection.  She was admitted to [facility] service and GI was consulted.  MRCP was done which showed enlarging cyst at the tail of pancreas.  Patient underwent pancreatic stent and celiac nerve block procedure.      Subjective   Patient seen and examined, complains of abdominal pain today   Assessment/Plan:     1. Acute/recurrent pancreatitis-patient presented with acute severe nonnecrotizing pancreatitis.  MRCP showed enlarging cyst at tail of pancreas.  Patient underwent ERCP as per [facility] with biliary sphincterotomy and pancreatic stent placement.  Also had celiac plexus nerve block.  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  Continue IV TPN.  Continue pain control with oxycodone, Dilaudid as needed. 2. COPD-not in exacerbation,  continue Symbicort. 3. Hypokalemia/hypomagnesemia-  resolved     COVID-19 Labs  No results for input(s): DDIMER, FERRITIN, LDH, CRP in the last 72 hours.  Lab Results  Component Value Date   SARSCOV2NAA NEGATIVE 09/26/2020   SARSCOV2NAA NEGATIVE 08/15/2020   SARSCOV2NAA NEGATIVE 07/11/2020   SARSCOV2NAA NEGATIVE 12/14/2019     Scheduled medications:   . Chlorhexidine Gluconate Cloth  6 each Topical Daily  . enoxaparin (LOVENOX) injection  40 mg Subcutaneous Q24H  . feeding supplement  1 Container Oral TID BM  . lipase/protease/amylase  72,000 Units Oral TID WC  . montelukast  10 mg Oral Daily  . pantoprazole (PROTONIX) IV  40 mg Intravenous Q12H  . sodium chloride flush  10-40 mL Intracatheter Q12H  . sucralfate  1 g Oral BID         CBG: Recent Labs  Lab 10/04/20 2202 10/05/20 0552 10/05/20 1503 10/05/20 2138 10/06/20 0622  GLUCAP 135* 118* 99 108* 120*    SpO2: 100 %    CBC: Recent Labs  Lab 09/30/20 0352 10/04/20 1243  WBC 5.6 17.7*  NEUTROABS 3.0 14.8*  HGB 12.0 12.7  HCT 35.2* 39.7  MCV 91.7 92.5  PLT 249 374    Basic Metabolic Panel: Recent Labs  Lab 09/30/20 0352 10/01/20 0415 10/03/20 0312 10/05/20 0312  NA 142 139 140 139  K 3.5 3.9 4.3 4.6  CL 106 103 103 106  CO2 29 28 28 24   GLUCOSE 103* 104* 99 103*  BUN 12 15 17 20   CREATININE 0.63 0.64 0.64 0.70  CALCIUM 9.2 9.1 9.2 9.4  MG 1.7 2.0 1.9  --   PHOS  3.5 4.8* 5.4* 4.3     Liver Function Tests: Recent Labs  Lab 09/30/20 0352 10/03/20 0312 10/04/20 1243  AST 17 16 22   ALT 27 25 47*  ALKPHOS 117 131* 131*  BILITOT 0.4 0.3 0.3  PROT 6.2* 5.9* 6.4*  ALBUMIN 3.4* 3.3* 3.4*     Antibiotics: Anti-infectives (From admission, onward)   Start     Dose/Rate Route Frequency Ordered Stop   10/04/20 0000  ciprofloxacin (CIPRO) IVPB 400 mg        400 mg 200 mL/hr over 60 Minutes Intravenous Every 12 hours 10/03/20 1335 10/06/20 1334       DVT prophylaxis:  Lovenox  Code Status: Full code  Family Communication: No family at bedside   Consultants:  Gastroenterology  Procedures:  ERCP, status post biliary sphincterotomy and pancreatic stent placement.  Celiac plexus block performed    Objective   Vitals:   10/05/20 0554 10/05/20 1500 10/05/20 1943 10/06/20 0620  BP: 127/79 117/77 123/70 117/71  Pulse: 92 100 (!) 104 88  Resp: 18 16 18 18   Temp: 98 F (36.7 C) 98.1 F (36.7 C) 98.4 F (36.9 C) 98.1 F (36.7 C)  TempSrc: Oral Oral Oral Oral  SpO2: 100% 100% 100% 100%  Weight:      Height:       No intake or output data in the 24 hours ending 10/06/20 1346  01/14 1901 - 01/16 0700 In: 1987 [P.O.:180; I.V.:1607] Out: -   Filed Weights   09/27/20 0900 10/03/20 0928 10/04/20 1420  Weight: 61.2 kg 61.2 kg 62.2 kg    Physical Examination:    General-appears in no acute distress  Heart-S1-S2, regular, no murmur auscultated  Lungs-clear to auscultation bilaterally, no wheezing or crackles auscultated  Abdomen-soft, nontender, no organomegaly  Extremities-no edema in the lower extremities  Neuro-alert, oriented x3, no focal deficit noted   Status is: Inpatient  Dispo: The patient is from: Home              Anticipated d/c is to: Home              Anticipated d/c date is: 10/10/2020              Patient currently not stable for discharge  Barrier to discharge-ongoing treatment for pancreatitis      Data Reviewed:   No results found for this or any previous visit (from the past 240 hour(s)).  Recent Labs  Lab 10/05/20 10/12/2020  LIPASE 159*      Braylee Bosher S Kaimani Clayson   [facility] If 7PM-7AM, please contact night-coverage at [email or web address], Office  [phone]   10/06/2020, 1:46 PM  LOS: 10 days

## 2020-10-06 NOTE — Progress Notes (Signed)
PHARMACY - TOTAL PARENTERAL NUTRITION CONSULT NOTE  Indication: Complicated pancreatitis with loculated pseudocysts  Patient Measurements: Height: _0  (170.2 cm) Weight: 58.4 kg (128 lb 12 oz) IBW/kg (Calculated) : 61.6 TPN AdjBW (KG): 58.4 Body mass index is 20.16 kg/m.  Assessment:  Tricia Brown recently admitted 10/20-11/17 with necrotizing pancreatitis with infected pseudocyst s/p cystgastrostomy and then stent removal.  Patient readmitted 11/25-12/8/21 with abdominal pain and progressive phlegmon in the LUQ.  Returned 09/26/20 with worsening abdominal pain and decreased PO intake.  Pharmacy consulted to continue TPN from PTA for recurrent pancreatitis.   Patient reports weighing ~150lbs early 2021, then lost weight with pancreatitis and was as low as 125 lb.  Since homeTPN started late 2021, she gained back some weight and has been maintaining at 135 lbs.  Patient remains on an 18-hr cycle TPN since discharge in Dec and has been on a liquid diet.  Any solid food would cause abdominal pain.  She missed 2 doses of TPN while holding in the ED.  Glucose / Insulin: no hx DM.  TPN cycle reduced to 20 hrs on 10/02/19 d/t hypoglycemia off TPN.  SSI D/C'ed 10/01/19.  CBGs now controlled. Electrolytes: 1/15 labs - all WNL (K and Phos high normal) Renal: SCr < 1, BUN WNL LFTs / TGs: AST / ALT / tbili WNL, alk phos up to 131, TG 69 > 313 Prealbumin / albumin: albumin up to 3.4, prealbumin WNL at 28.3, lipase up to 159 Intake / Output; MIVF: LBM 1/13 (high dose opiates), LR at 100 ml/hr, no I/O's recorded GI Imaging:  1/6 CT: acute non-necrotizing pancreatitis, peripancreatic fluid collection and developing pseudocyst 1/8 MRCP - pancreatic cyst, mildly increased in size GI meds: Creon TID, IV PPI BID, Sucralfate decreased to BID on 1/15, off Colestipol 1/15 d/t constipation, Miralax x1 on 1/15 Surgeries / Procedures:  1/13: EGD, upper EUS, ERCP, sphincterotomy, removal of stones, pancreatic stent  placement.  Central access: PICC placed 08/23/20 TPN start date: chronic/home  Nutritional Goals (per RD rec on 1/7): 1950-2150 kCal, 105-120g protein, >1.9L fluid per day  TPN formula from [facility]:  207m over 18 hrs:  102g AA, 260g CHO and 40g ILE for a total of 1692 kCal per day Lytes: increasing K needs, mag 21m/d  Current Nutrition:  TPN Full liquid diet Boost PO TID - 3 charted given yesterday  Plan:  Continue cyclic TPN, infuse 196073l over 20 hrs: 51 ml/hr on first and last hr, 101 ml/hr x 18 hrs TPN provides 105g AA, 384g CHO and 0g ILE for a total of 1728 kCal, meeting 88% of needs Lipids removed from TPN on 1/13 due to elevated TG Electrolytes: Na 7587mL, K reduced slightly to 78m25m on 1/15, Ca 3mEq3m Mag 17mEq78mPhos removed 1/13, Cl:AC 1:1 - no change today Add MVI and trace elements in TPN  Continue Q8H CBG checks to monitor for hypoglycemia, especially while off TPN LR at 100 ml/hr per MD Standard TPN labs on Mon/Thurs.  F/U TG on 1/17 with plan to add back lipids (likely MWF)  Per GI, if no improvement in the next several weeks, may need pancreatic surgery at tertiary center  [name] D. Alfred Eckley, [name], BCPS, BCCCP [place]2022, 7:35 AM

## 2020-10-07 ENCOUNTER — Encounter (HOSPITAL_COMMUNITY): Payer: Self-pay | Admitting: Gastroenterology

## 2020-10-07 DIAGNOSIS — F32A Depression, unspecified: Secondary | ICD-10-CM

## 2020-10-07 LAB — COMPREHENSIVE METABOLIC PANEL
ALT: 63 U/L — ABNORMAL HIGH (ref 0–44)
AST: 32 U/L (ref 15–41)
Albumin: 3.2 g/dL — ABNORMAL LOW (ref 3.5–5.0)
Alkaline Phosphatase: 167 U/L — ABNORMAL HIGH (ref 38–126)
Anion gap: 10 (ref 5–15)
BUN: 17 mg/dL (ref 6–20)
CO2: 27 mmol/L (ref 22–32)
Calcium: 9.5 mg/dL (ref 8.9–10.3)
Chloride: 101 mmol/L (ref 98–111)
Creatinine, Ser: 0.62 mg/dL (ref 0.44–1.00)
GFR, Estimated: >60 mL/min (ref >60)
Glucose, Bld: 110 mg/dL — ABNORMAL HIGH (ref 70–99)
Potassium: 4.6 mmol/L (ref 3.5–5.1)
Sodium: 138 mmol/L (ref 135–145)
Total Bilirubin: 0.3 mg/dL (ref 0.3–1.2)
Total Protein: 6.2 g/dL — ABNORMAL LOW (ref 6.5–8.1)

## 2020-10-07 LAB — CBC
HCT: 35.8 % — ABNORMAL LOW (ref 36.0–46.0)
Hemoglobin: 11.2 g/dL — ABNORMAL LOW (ref 12.0–15.0)
MCH: 29.5 pg (ref 26.0–34.0)
MCHC: 31.3 g/dL (ref 30.0–36.0)
MCV: 94.2 fL (ref 80.0–100.0)
Platelets: 345 10*3/uL (ref 150–400)
RBC: 3.8 MIL/uL — ABNORMAL LOW (ref 3.87–5.11)
RDW: 13.8 % (ref 11.5–15.5)
WBC: 11.3 10*3/uL — ABNORMAL HIGH (ref 4.0–10.5)
nRBC: 0 % (ref 0.0–0.2)

## 2020-10-07 LAB — DIFFERENTIAL
Abs Immature Granulocytes: 0.05 10*3/uL (ref 0.00–0.07)
Basophils Absolute: 0.1 10*3/uL (ref 0.0–0.1)
Basophils Relative: 1 %
Eosinophils Absolute: 0.4 10*3/uL (ref 0.0–0.5)
Eosinophils Relative: 3 %
Immature Granulocytes: 0 %
Lymphocytes Relative: 19 %
Lymphs Abs: 2.1 10*3/uL (ref 0.7–4.0)
Monocytes Absolute: 0.9 10*3/uL (ref 0.1–1.0)
Monocytes Relative: 8 %
Neutro Abs: 7.8 10*3/uL — ABNORMAL HIGH (ref 1.7–7.7)
Neutrophils Relative %: 69 %

## 2020-10-07 LAB — GLUCOSE, CAPILLARY
Glucose-Capillary: 58 mg/dL — ABNORMAL LOW (ref 70–99)
Glucose-Capillary: 60 mg/dL — ABNORMAL LOW (ref 70–99)
Glucose-Capillary: 88 mg/dL (ref 70–99)
Glucose-Capillary: 87 mg/dL (ref 70–99)

## 2020-10-07 LAB — MAGNESIUM: Magnesium: 1.8 mg/dL (ref 1.7–2.4)

## 2020-10-07 LAB — PHOSPHORUS: Phosphorus: 4.9 mg/dL — ABNORMAL HIGH (ref 2.5–4.6)

## 2020-10-07 LAB — TRIGLYCERIDES: Triglycerides: 151 mg/dL — ABNORMAL HIGH (ref <150)

## 2020-10-07 LAB — PREALBUMIN: Prealbumin: 29.7 mg/dL (ref 18–38)

## 2020-10-07 MED ORDER — TRACE MINERALS CU-MN-SE-ZN 300-55-60-3000 MCG/ML IV SOLN
INTRAVENOUS | Status: AC
  Filled 2020-10-07: qty 704

## 2020-10-07 MED ORDER — OXYCODONE HCL 5 MG PO TABS
15.0000 mg | ORAL_TABLET | ORAL | Status: DC | PRN
  Administered 2020-10-07 – 2020-10-09 (×12): 15 mg via ORAL
  Filled 2020-10-07 (×12): qty 3

## 2020-10-07 MED ORDER — POLYETHYLENE GLYCOL 3350 17 G PO PACK
17.0000 g | PACK | Freq: Every day | ORAL | Status: DC
  Administered 2020-10-07 – 2020-10-09 (×3): 17 g via ORAL
  Filled 2020-10-07 (×3): qty 1

## 2020-10-07 NOTE — Progress Notes (Signed)
PROGRESS NOTE  Tricia Brown ZOX:[id number] DOB: [birth year] DOA: 09/25/2020 PCP: [name], MD   LOS: 11 days   Brief narrative:  45 year old female with medical history of pancreatitis, chronic pain on methadone, depression presented to the hospital with worsening abdominal pain.  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  She was most recently treated with meropenem and MS Contin and breakthrough Oxy IR.  She was discharged home with TPN.  Since discharge she has been on TPN IV for 18 hours a day.  She was also on Carafate 4 times daily, Protonix once daily, Zofran 3 times daily, Bentyl, pancreatic enzymes twice daily.  On this admission she had diarrhea unable to hold anything down with progressive weakness for 1 week.  She was then brought into the hospital.  In the ED she was hemodynamically stable and afebrile.  CT showed acute nonnecrotizing pancreatitis with peripancreatic fluid collection.  She was admitted to hospitalist service and GI was consulted.  MRCP was done which showed enlarging cyst at the tail of pancreas.  Patient underwent pancreatic stent and celiac nerve block procedure.    Assessment/Plan:  Principal Problem:   Acute pancreatitis Active Problems:   Depression   Opioid use disorder, severe, dependence (HCC)  Acute/recurrent pancreatitis-acute severe nonnecrotizing pancreatitis.  MRCP showed enlarging cyst at tail of pancreas.  Patient underwent ERCP  with biliary sphincterotomy and pancreatic stent placement.    Additionally patient also had celiac plexus nerve block.  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    Patient has been continued on TPN.  Continue pain control with oxycodone Dilaudid.  Has been started on full liquids.  Off colestipol due to constipation.  Continue IV fluids.   Sucralfate dose has been decreased. Still no bowel movement. Wishes to change IV Dilaudid to oral every 4 hourly with PO. Will change, add a dose of Miralax today.  COPD- continue Symbicort.  Hypokalemia/hypomagnesemia-  resolved.  Check BMP in AM.  DVT prophylaxis: enoxaparin (LOVENOX) injection 40 mg Start: 10/05/20 0000    Code Status: Full code  Family Communication: none  Status is: Inpatient  Remains inpatient appropriate because:IV treatments appropriate due to intensity of illness or inability to take PO and IV TPN IV pain medication   Dispo: The patient is from: Home              Anticipated d/c is to: Home              Anticipated d/c date is: 1-2 days , will change IV to  Po narcotics, continue bowel regimen.              Patient currently is not medically stable to d/c.   Consultants:  GI  Procedures:  ERCP, status post biliary sphincterotomy and pancreatic stent placement.  Celiac plexus block performed  Anti-infectives:  . None  Anti-infectives (From admission, onward)   Start     Dose/Rate Route Frequency Ordered Stop   10/04/20 0000  ciprofloxacin (CIPRO) IVPB 400 mg        400 mg 200 mL/hr over 60 Minutes Intravenous Every 12 hours 10/03/20 1335 10/06/20 1334       Subjective: Today, patient was seen and examined at bedside.  Complains of moderate abdominal pain at a 7/10 intensity but wishes to change IV Dilaudid to p.o.  Wants to go home tomorrow.  Has not  had a bowel movement.  Objective: Vitals:   10/06/20 2119 10/07/20 0653  BP: 128/63 132/89  Pulse: 86 90  Resp: 18 18  Temp: 98.9 F (37.2 C)   SpO2: 100% 100%    Intake/Output Summary (Last 24 hours) at 10/07/2020 0904 Last data filed at 10/06/2020 1700 Gross per 24 hour  Intake 3913.15 ml  Output --  Net 3913.15 ml   Filed Weights   09/27/20 0900 10/03/20 0928 10/04/20 1420  Weight: 61.2 kg 61.2 kg 62.2 kg   Body mass index is 21.47 kg/m.   Physical Exam:  GENERAL:  Patient is alert awake and oriented. Not in obvious distress.  Thinly built HENT: No scleral pallor or icterus. Pupils equally reactive to light. Oral mucosa is moist NECK: is supple, no gross swelling noted. CHEST: Clear to auscultation. No crackles or wheezes.  Diminished breath sounds bilaterally. CVS: S1 and S2 heard, no murmur. Regular rate and rhythm.  ABDOMEN: Soft but mildly tender over the epigastric region,  EXTREMITIES: No edema. CNS: Cranial nerves are intact. No focal motor deficits. SKIN: warm and dry without rashes.  Data Review: I have personally reviewed the following laboratory data and studies,  CBC: Recent Labs  Lab 10/04/20 1243 10/07/20 0320  WBC 17.7* 11.3*  NEUTROABS 14.8* 7.8*  HGB 12.7 11.2*  HCT 39.7 35.8*  MCV 92.5 94.2  PLT 374 345   Basic Metabolic Panel: Recent Labs  Lab 10/01/20 0415 10/03/20 0312 10/05/20 0312 10/07/20 0320  NA 139 140 139 138  K 3.9 4.3 4.6 4.6  CL 103 103 106 101  CO2 28 28 24 27   GLUCOSE 104* 99 103* 110*  BUN 15 17 20 17   CREATININE 0.64 0.64 0.70 0.62  CALCIUM 9.1 9.2 9.4 9.5  MG 2.0 1.9  --  1.8  PHOS 4.8* 5.4* 4.3 4.9*   Liver Function Tests: Recent Labs  Lab 10/03/20 0312 10/04/20 1243 10/07/20 0320  AST 16 22 32  ALT 25 47* 63*  ALKPHOS 131* 131* 167*  BILITOT 0.3 0.3 0.3  PROT 5.9* 6.4* 6.2*  ALBUMIN 3.3* 3.4* 3.2*   Recent Labs  Lab 10/05/20 0312  LIPASE 159*   No results for input(s): AMMONIA in the last 168 hours. Cardiac Enzymes: No results for input(s): CKTOTAL, CKMB, CKMBINDEX, TROPONINI in the last 168 hours. BNP (last 3 results) No results for input(s): BNP in the last 8760 hours.  ProBNP (last 3 results) No results for input(s): PROBNP in the last 8760 hours.  CBG: Recent Labs  Lab 10/05/20 0552 10/05/20 1503 10/05/20 2138 10/06/20 0622 10/06/20 2111  GLUCAP 118* 99 108* 120* 85   No results found for this or any previous visit (from the past 240 hour(s)).    Studies: No results found.    10/08/20, MD  [facility] 10/07/2020  If 7PM-7AM, please contact night-coverage

## 2020-10-07 NOTE — Progress Notes (Signed)
Nutrition Follow-up  DOCUMENTATION CODES:   Not applicable  INTERVENTION:   -TPN management per pharmacy -Continue Boost Breeze po TID, each supplement provides 250 kcal and 9 grams of protein  NUTRITION DIAGNOSIS:   Increased nutrient needs related to chronic illness (pancreatitis) as evidenced by estimated needs.  Ongoing  GOAL:   Patient will meet greater than or equal to 90% of their needs  Met with TPN  MONITOR:   PO intake,Supplement acceptance,Diet advancement,Labs,Weight trends,Skin,I & O's  REASON FOR ASSESSMENT:   Consult New TPN/TNA  ASSESSMENT:   Tricia Brown is a 45 y.o. female with medical history significant of chronic pain, on methadone; and depression presenting with worsening abdominal pain.  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  She was most recently treated with Meropenem and MS Contin with breakthrough Oxy IR.  She was discharged home with TPN.  Since d/c, she has been taking TPN IV x 18 hours a day.  She is also on Carafate QID, Protonix once daily, Zofran TID, Bentyl, and pancreatic enzymes BID.  She is also on binder BID to help with diarrhea.  She started with temp to 99 -> 100 over the last few days.  She was no longer able to keep ginger ale and broth down.  She was having trouble keeping her pills down too.  They are trying to get her referred to [facility].  The pain has been gradually worsening for about a week.  Her pain medications are not keeping the pain at [place].  She finished her antibiotic during her last hospitalization.  1/10- MRI revealed enlarging cysts in pancreatic tail 1/13- s/p ERCP- revealed gastritis (biopsied), biliarysphincterotomy  performed, pancreatic sphincterotomy performed, biliary tree swept and no evidence of stones, no evidence of pancreatic duct abnormality, pancreatic stent placed; s/p EUS-  cystic lesion in pancreatic tail (possible pancreatic pseudocyst that is too small for drainage),pancreatic parenchymal abnormalities, enlarged lymph nodes in celiac region, celiac plexus block performed  Reviewed I/O's: +3.9 L x 24 hours and +37 L since admission  Pt unavailable at time of attempted contact.   Pt remains with minimal PO intake. Noted meal completions 0-25%. Pt is consuming Boost Breeze supplements and tolerating well.  Pt remains dependent on TPN. She is currently receiving cyclic TPN over 20 hours (51 ml/hr on first and last hr, 101 ml/hr x 18 hrs). Regimen providing 1728 kcals and 105 grams protein, meeting 89% of estimated kcal needs and 100% of estimated protein needs. Per pharmacy note, lipid were removed from TPN on 10/04/19 due to elevated triglycerides.   Per pharmacy note, plan to add SMOFlipid today (M, W, F). At 1800, pt to receive cyclic TPN over 20 hours (51 ml/hr on first and last hr, 101 ml/hr x 18 hrs). Regimen will provide 1975 kcals and 105 grams protein, meeting 100% of needs.   Medications reviewed and include creon and lactated ringers infusion @ 100 ml/hr.   .Labs reviewed: Phos: 4.9 (removed from TPN on 10/03/20), CBGS: 85-120.   Diet Order:   Diet Order            Diet full liquid Room service appropriate? Yes; Fluid consistency: Thin  Diet effective now                 EDUCATION NEEDS:   No education needs have been identified at this time  Skin:  Skin Assessment: Reviewed RN Assessment  Last  BM:  09/26/20  Height:   Ht Readings from Last 1 Encounters:  10/03/20 $RemoveB'5\' 7"'CjvmYFoO$  (1.702 m)    Weight:   Wt Readings from Last 1 Encounters:  10/04/20 62.2 kg    Ideal Body Weight:  61.4 kg  BMI:  Body mass index is 21.47 kg/m.  Estimated Nutritional Needs:   Kcal:  1950-2150  Protein:  105-120 grams  Fluid:  > 1.9 L    Loistine Chance, RD, LDN, [place] Registered Dietitian II Certified Diabetes Care and Education Specialist Please refer  to [facility] for RD and/or RD on-call/weekend/after hours pager

## 2020-10-07 NOTE — Progress Notes (Signed)
PHARMACY - TOTAL PARENTERAL NUTRITION CONSULT NOTE  Indication: Complicated pancreatitis with loculated pseudocysts  Patient Measurements: Height: 5\' 7"  (170.2 cm) Weight: 58.4 kg (128 lb 12 oz) IBW/kg (Calculated) : 61.6 TPN AdjBW (KG): 58.4 Body mass index is 20.16 kg/m.  Assessment:  55 YOF recently admitted 10/20-11/17 with necrotizing pancreatitis with infected pseudocyst s/p cystgastrostomy and then stent removal.  Patient readmitted 11/25-12/8/21 with abdominal pain and progressive phlegmon in the LUQ.  Returned 09/26/20 with worsening abdominal pain and decreased PO intake.  Pharmacy consulted to continue TPN from PTA for recurrent pancreatitis.   Patient reports weighing ~150lbs early 2021, then lost weight with pancreatitis and was as low as 125 lb.  Since homeTPN started late 2021, she gained back some weight and has been maintaining at 135 lbs.  Patient remains on an 18-hr cycle TPN since discharge in Dec and has been on a liquid diet.  Any solid food would cause abdominal pain.  She missed 2 doses of TPN while holding in the ED.  Glucose / Insulin: no hx DM.  TPN cycle reduced to 20 hrs on 10/02/19 d/t hypoglycemia off TPN.  SSI D/C'ed 10/01/19.  CBGs now controlled. Electrolytes: Phos elevated at 4.9 (none in TPN, Ca x Phos = 49.6, goal < 55), others WNL Renal: SCr < 1, BUN WNL LFTs / TGs: AST / tbili WNL, alk phos up to 167, ALT up to 63, TG down to 151 with no lipids in TPN on 1/13-1/16 Prealbumin / albumin: albumin 3.2, prealbumin WNL at 29.7, lipase up to 159 Intake / Output; MIVF: LBM 1/13 (high dose opiates), LR at 100 ml/hr, no I/O's recorded GI Imaging:  1/6 CT: acute non-necrotizing pancreatitis, peripancreatic fluid collection and developing pseudocyst 1/8 MRCP - pancreatic cyst, mildly increased in size GI meds: Creon TID, IV PPI BID, Sucralfate decreased to BID on 1/15, off Colestipol 1/15 d/t constipation, Miralax x1 on 1/15 Surgeries / Procedures:  1/13: EGD,  upper EUS, ERCP, sphincterotomy, removal of stones, pancreatic stent placement.  Central access: PICC placed 08/23/20 TPN start date: chronic/home  Nutritional Goals (per RD rec on 1/7): 1950-2150 kCal, 105-120g protein, >1.9L fluid per day  TPN formula from [facility]:  2048mL over 18 hrs:  102g AA, 260g CHO and 40g ILE for a total of 1692 kCal per day Lytes: increasing K needs, mag 76mEq/d  Current Nutrition:  TPN Full liquid diet Boost PO TID - 2 charted given yesterday  Plan:  Cyclic TPN with 62G/B SMOFlipid on MWF due to elevated TG  Continue cyclic TPN, infuse 1517 ml over 20 hrs: 51 ml/hr on first and last hr, 101 ml/hr x 18 hrs TPN will provide a weekly average of 1975 kCal and 106g AA per day, meeting ~100% of needs Electrolytes: Na 57mEq/L, reduce K slightly to 67mEq/L, reduce Ca to 68mEq/L to increase Mag to 76mEq/L, Phos removed 1/13, Cl:AC 1:1  Add MVI and trace elements in TPN  Continue Q8H CBG checks to monitor for hypoglycemia, especially while off TPN LR at 100 ml/hr per MD Standard TPN labs on Mon/Thurs, bowel regimen per MD  Per GI, if no improvement in the next several weeks, may need pancreatic surgery at tertiary center   Gustavo Meditz D. Mina Marble, PharmD, BCPS, [place] 10/07/2020, 7:29 AM

## 2020-10-07 NOTE — Progress Notes (Signed)
Recall has been entered as requested  

## 2020-10-07 NOTE — Progress Notes (Signed)
The patient indicates that her abdominal pain is bad again today.  It has been an up-and-down course for her; her pain was substantially improved when she was last seen by [place] 2 days ago.  No vomiting, some nausea, some reflux.  On exam, the patient is sitting in the bedside chair in no evident distress.  Her labs are somewhat improved, with a white count having dropped from 17.7 to 11.3 over the past several days, with other labs essentially in the normal range.  Recommendations:  1.  Agree with trial of more frequent oral Dilaudid dosing.  The patient feels that, in her current status, she could go home if this brings about adequate pain control.   2.  I will ask our office to put a tracer on the referral to [facility] surgery department that was apparently made about a month ago, to check its current status.  Florencia Reasons, M.D. Pager [phone] If no answer or after 5 PM call [phone]

## 2020-10-08 LAB — GLUCOSE, CAPILLARY
Glucose-Capillary: 110 mg/dL — ABNORMAL HIGH (ref 70–99)
Glucose-Capillary: 113 mg/dL — ABNORMAL HIGH (ref 70–99)
Glucose-Capillary: 114 mg/dL — ABNORMAL HIGH (ref 70–99)
Glucose-Capillary: 82 mg/dL (ref 70–99)

## 2020-10-08 MED ORDER — SUCRALFATE 1 G PO TABS: 1.0000 g | ORAL_TABLET | Freq: Two times a day (BID) | ORAL | Status: DC

## 2020-10-08 MED ORDER — COVID-19 MRNA VACC (MODERNA) 100 MCG/0.5ML IM SUSP
0.5000 mL | Freq: Once | INTRAMUSCULAR | Status: AC
  Filled 2020-10-08: qty 0.5

## 2020-10-08 MED ORDER — TRACE MINERALS CU-MN-SE-ZN 300-55-60-3000 MCG/ML IV SOLN
INTRAVENOUS | Status: DC
  Filled 2020-10-08: qty 704

## 2020-10-08 MED ORDER — COLESTIPOL HCL 1 G PO TABS: 1.0000 g | ORAL_TABLET | Freq: Two times a day (BID) | ORAL | Status: DC

## 2020-10-08 MED ORDER — OXYCODONE HCL 15 MG PO TABS: 15.0000 mg | ORAL_TABLET | ORAL | 0 refills | Status: DC | PRN

## 2020-10-08 NOTE — Progress Notes (Signed)
PROGRESS NOTE  Tricia Brown LOV:[id number] DOB: [birth year] DOA: 09/25/2020 PCP: [name], MD   LOS: 12 days   Brief narrative:  45 year old female with medical history of pancreatitis, chronic pain on methadone, depression presented to the hospital with worsening abdominal pain.  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  She was most recently treated with meropenem and MS Contin and breakthrough Oxy IR.  She was discharged home with TPN.  Since discharge she has been on TPN IV for 18 hours a day.  She was also on Carafate 4 times daily, Protonix once daily, Zofran 3 times daily, Bentyl, pancreatic enzymes twice daily.  On this admission she had diarrhea unable to hold anything down with progressive weakness for 1 week.  She was then brought into the hospital.  In the ED she was hemodynamically stable and afebrile.  CT showed acute nonnecrotizing pancreatitis with peripancreatic fluid collection.  She was admitted to hospitalist service and GI was consulted.  MRCP was done which showed enlarging cyst at the tail of pancreas.  Patient underwent pancreatic stent and celiac nerve block procedure.    Assessment/Plan:  Principal Problem:   Acute pancreatitis Active Problems:   Depression   Opioid use disorder, severe, dependence (HCC)  Acute/recurrent pancreatitis-acute severe nonnecrotizing pancreatitis.  MRCP showed enlarging cyst at tail of pancreas.  Patient underwent ERCP  with biliary sphincterotomy and pancreatic stent placement.    Additionally patient also had celiac plexus nerve block.  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    Patient has been continued on TPN.  Continue pain control with oxycodone.  Dose has been increased to every 4 hourly.   Off colestipol due to constipation.  Sucralfate dose  has been decreased IV Dilaudid has been discontinued at this time.  GI has seen the patient at this time and recommend discharge home with outpatient follow-up.  Patient has been referred to [facility] for management of complicated pancreatitis, GI to follow on this morning.  Patient will also need to follow-up with Dr. Ewing Schlein GI for pancreatic duct stent removal or exchange discharge.Marland Kitchen  COPD- continue Symbicort.  Hypokalemia/hypomagnesemia-  resolved.  Check BMP in AM.  DVT prophylaxis: enoxaparin (LOVENOX) injection 40 mg Start: 10/05/20 0000   Code Status: Full code  Family Communication: none  Status is: Inpatient  Remains inpatient appropriate because:IV treatments appropriate due to intensity of illness or inability to take PO and IV TPN IV pain medication   Dispo: The patient is from: Home              Anticipated d/c is to: Home              Anticipated d/c date is: Today but I have been told that it was not possible to arrange TPN at home today.  Likely disposition tomorrow              Patient currently is not medically stable to d/c.   Consultants:  GI  Procedures:  ERCP, status post biliary sphincterotomy and pancreatic stent placement.  Celiac plexus block performed  Anti-infectives:  . None  Anti-infectives (From admission, onward)   Start     Dose/Rate Route Frequency Ordered Stop   10/04/20 0000  ciprofloxacin (CIPRO) IVPB 400 mg        400 mg 200 mL/hr over 60 Minutes Intravenous Every 12 hours 10/03/20 1335 10/06/20  1334       Subjective: Today, patient was seen and examined at bedside.  Has mild to moderate abdominal pain but better.  No vomiting but has chronic nausea.  Wishes to go home  Objective: Vitals:   10/07/20 1337 10/08/20 0607  BP: 127/82 (!) 121/59  Pulse: 93 92  Resp: 17 17  Temp: 98.4 F (36.9 C) (!) 97.5 F (36.4 C)  SpO2: 100% 99%    Intake/Output Summary (Last 24 hours) at 10/08/2020 1346 Last data filed at 10/08/2020  1102 Gross per 24 hour  Intake 4384.65 ml  Output --  Net 4384.65 ml   Filed Weights   09/27/20 0900 10/03/20 0928 10/04/20 1420  Weight: 61.2 kg 61.2 kg 62.2 kg   Body mass index is 21.47 kg/m.   Physical Exam: GENERAL: Patient is alert awake and oriented. Not in obvious distress.  Thinly built HENT: No scleral pallor or icterus. Pupils equally reactive to light. Oral mucosa is moist NECK: is supple, no gross swelling noted. CHEST: Clear to auscultation. No crackles or wheezes.  Diminished breath sounds bilaterally. CVS: S1 and S2 heard, no murmur. Regular rate and rhythm.  ABDOMEN: Soft but mildly tender over the epigastric region,  EXTREMITIES: No edema. CNS: Cranial nerves are intact. No focal motor deficits. SKIN: warm and dry without rashes.  Data Review: I have personally reviewed the following laboratory data and studies,  CBC: Recent Labs  Lab 10/04/20 1243 10/07/20 0320  WBC 17.7* 11.3*  NEUTROABS 14.8* 7.8*  HGB 12.7 11.2*  HCT 39.7 35.8*  MCV 92.5 94.2  PLT 374 345   Basic Metabolic Panel: Recent Labs  Lab 10/03/20 0312 10/05/20 0312 10/07/20 0320  NA 140 139 138  K 4.3 4.6 4.6  CL 103 106 101  CO2 28 24 27   GLUCOSE 99 103* 110*  BUN 17 20 17   CREATININE 0.64 0.70 0.62  CALCIUM 9.2 9.4 9.5  MG 1.9  --  1.8  PHOS 5.4* 4.3 4.9*   Liver Function Tests: Recent Labs  Lab 10/03/20 0312 10/04/20 1243 10/07/20 0320  AST 16 22 32  ALT 25 47* 63*  ALKPHOS 131* 131* 167*  BILITOT 0.3 0.3 0.3  PROT 5.9* 6.4* 6.2*  ALBUMIN 3.3* 3.4* 3.2*   Recent Labs  Lab 10/05/20 0312  LIPASE 159*   No results for input(s): AMMONIA in the last 168 hours. Cardiac Enzymes: No results for input(s): CKTOTAL, CKMB, CKMBINDEX, TROPONINI in the last 168 hours. BNP (last 3 results) No results for input(s): BNP in the last 8760 hours.  ProBNP (last 3 results) No results for input(s): PROBNP in the last 8760 hours.  CBG: Recent Labs  Lab 10/07/20 1732  10/07/20 2243 10/08/20 0610 10/08/20 0814 10/08/20 1222  GLUCAP 88 87 114* 110* 82   No results found for this or any previous visit (from the past 240 hour(s)).   Studies: No results found.    10/10/20, MD  [facility] 10/08/2020  If 7PM-7AM, please contact night-coverage

## 2020-10-08 NOTE — Progress Notes (Signed)
PHARMACY - TOTAL PARENTERAL NUTRITION CONSULT NOTE  Indication: Complicated pancreatitis with loculated pseudocysts  Patient Measurements: Height: _0  (170.2 cm) Weight: 58.4 kg (128 lb 12 oz) IBW/kg (Calculated) : 61.6 TPN AdjBW (KG): 58.4 Body mass index is 20.16 kg/m.  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  Patient readmitted 11/25-12/8/21 with abdominal pain and progressive phlegmon in the LUQ.  Returned 09/26/20 with worsening abdominal pain and decreased PO intake.  Pharmacy consulted to continue TPN from PTA for recurrent pancreatitis.   Patient reports weighing ~150lbs early 2021, then lost weight with pancreatitis and was as low as 125 lb.  Since homeTPN started late 2021, she gained back some weight and has been maintaining at 135 lbs.  Patient remains on an 18-hr cycle TPN since discharge in Dec and has been on a liquid diet.  Any solid food would cause abdominal pain.  She missed 2 doses of TPN while holding in the ED.  Glucose / Insulin: no hx DM.  TPN cycle reduced to 20 hrs on 10/02/19 d/t hypoglycemia off TPN.  SSI D/C'ed 10/01/19.  CBGs controlled - still on lower end with low of 58 noted on 1/17 afternoon. Electrolytes: From 1/17: Phos elevated at 4.9 (none in TPN, Ca x Phos = 49.6, goal < 55), others WNL Renal: SCr < 1, BUN WNL LFTs / TGs: AST / tbili WNL, alk phos up to 167, ALT up to 63, TG down to 151 with no lipids in TPN on 1/13-1/16 Prealbumin / albumin: albumin 3.2, prealbumin WNL at 29.7, lipase up to 159 Intake / Output; MIVF: LBM 1/13 (high dose opiates), LR at 100 ml/hr, no I/O's recorded GI Imaging:  1/6 CT: acute non-necrotizing pancreatitis, peripancreatic fluid collection and developing pseudocyst 1/8 MRCP - pancreatic cyst, mildly increased in size GI meds: Creon TID, IV PPI BID, Sucralfate decreased to BID on 1/15, off Colestipol 1/15 d/t  constipation, Miralax x1 on 1/15 Surgeries / Procedures:  1/13: EGD, upper EUS, ERCP, sphincterotomy, removal of stones, pancreatic stent placement.  Central access: PICC placed 08/23/20 TPN start date: chronic/home  Nutritional Goals (per RD rec on 1/17): 1950-2150 kCal, 105-120g protein, >1.9L fluid per day  TPN formula from [facility]:  2065m over 18 hrs:  102g AA, 260g CHO and 40g ILE for a total of 1692 kCal per day Lytes: increasing K needs, mag 253m/d  Current Nutrition:  TPN Full liquid diet Boost PO TID - 3 charted given yesterday  Plan:  Cyclic TPN with 3011B/JMOFlipid on MWF due to elevated TG  Continue cyclic TPN, infuse 194782l over 20 hrs: 51 ml/hr on first and last hr, 101 ml/hr x 18 hrs TPN will provide a weekly average of 1975 kCal and 106g AA per day, meeting ~100% of needs Electrolytes: Na 7560mL, K 54m89m, Ca 2mEq45m Mag 18mEq65mPhos removed 1/13, Cl:AC 1:1  Add MVI and trace elements in TPN  Continue Q8H CBG checks to monitor for hypoglycemia, especially while off TPN LR at 100 ml/hr per MD Standard TPN labs on Mon/Thurs, bowel regimen per MD  Per GI, if no improvement in the next several weeks, may need pancreatic surgery at tertiary center  Thank you for allowing pharmacy to be a part of this patient's care.  ElizabAlycia RossettimD, BCPS Clinical Pharmacist Clinical phone for 10/08/2020: [phone] 7:02 AM   **Pharmacist phone directory can now be found on [email or web address]  Listed under  [facility].

## 2020-10-08 NOTE — Care Management (Addendum)
Discharge order in. NCM was unaware and did not notify [facility] until now .   NCM has reached out to [facility] with [facility] regarding resumption of home TPN. Awaiting call back.   [name] is checking to see if they can supply TPN today . [facility] has not prepared home TPN for this evening.   [facility] has made patient's TPN for this evening.  Patient has TPN at home already. Checking with [facility] to make sure home TPN not expired. Per [name] her home TPN expired October 03, 2020 . Patient aware.  Ronny Flurry RN

## 2020-10-08 NOTE — Progress Notes (Signed)
Patient indicates that her pain is a little bit better today, on increased dose of oral Dilaudid.    She is up and walking in the halls and appears better overall.  She states she feels ready to go home.  Impression: Chronic, complicated pancreatitis including pseudocyst, with improved pain.  Unclear whether the current improvement reflects the normal fluctuation of the severity of her symptoms, versus an evolving benefit from her recent celiac plexus block and/or her recent biliary and pancreatic sphincterotomies, and pancreatic stent placement.  Recommendations:  1.  No objection to discharge from my standpoint  2.  I have advised the patient to contact our office in the next day or so, to inquire about the status of her referral to [facility] for management of her complicated pancreatitis.  3.  I will notify Dr. Ewing Schlein of the need for pancreatic duct stent removal or exchange approximately 3 months from now, to be performed either by him or Dr. Meridee Score  No additional suggestions at present.  Florencia Reasons, M.D. Pager [phone] If no answer or after 5 PM call [phone]

## 2020-10-09 LAB — BASIC METABOLIC PANEL WITH GFR
Anion gap: 10 (ref 5–15)
BUN: 20 mg/dL (ref 6–20)
CO2: 24 mmol/L (ref 22–32)
Calcium: 9.4 mg/dL (ref 8.9–10.3)
Chloride: 102 mmol/L (ref 98–111)
Creatinine, Ser: 0.71 mg/dL (ref 0.44–1.00)
GFR, Estimated: >60 mL/min
Glucose, Bld: 100 mg/dL — ABNORMAL HIGH (ref 70–99)
Potassium: 4.2 mmol/L (ref 3.5–5.1)
Sodium: 136 mmol/L (ref 135–145)

## 2020-10-09 LAB — CBC
HCT: 36.4 % (ref 36.0–46.0)
Hemoglobin: 12.2 g/dL (ref 12.0–15.0)
MCH: 31 pg (ref 26.0–34.0)
MCHC: 33.5 g/dL (ref 30.0–36.0)
MCV: 92.4 fL (ref 80.0–100.0)
Platelets: 393 10*3/uL (ref 150–400)
RBC: 3.94 MIL/uL (ref 3.87–5.11)
RDW: 13.5 % (ref 11.5–15.5)
WBC: 9.6 10*3/uL (ref 4.0–10.5)
nRBC: 0 % (ref 0.0–0.2)

## 2020-10-09 LAB — GLUCOSE, CAPILLARY
Glucose-Capillary: 100 mg/dL — ABNORMAL HIGH (ref 70–99)
Glucose-Capillary: 62 mg/dL — ABNORMAL LOW (ref 70–99)

## 2020-10-09 MED ORDER — HEPARIN SOD (PORK) LOCK FLUSH 100 UNIT/ML IV SOLN
250.0000 [IU] | INTRAVENOUS | Status: DC | PRN
  Filled 2020-10-09: qty 2.5

## 2020-10-09 NOTE — Progress Notes (Signed)
PHARMACY - TOTAL PARENTERAL NUTRITION CONSULT NOTE  Indication: Complicated pancreatitis with loculated pseudocysts  Patient Measurements: Height: 5\' 7"  (170.2 cm) Weight: 58.4 kg (128 lb 12 oz) IBW/kg (Calculated) : 61.6 TPN AdjBW (KG): 58.4 Body mass index is 20.16 kg/m.  Assessment:  Tricia Brown recently admitted 10/20-11/17 with necrotizing pancreatitis with infected pseudocyst s/p cystgastrostomy and then stent removal.  Patient readmitted 11/25-12/8/21 with abdominal pain and progressive phlegmon in the LUQ.  Returned 09/26/20 with worsening abdominal pain and decreased PO intake.  Pharmacy consulted to continue TPN from PTA for recurrent pancreatitis.   Patient reports weighing ~150lbs early 2021, then lost weight with pancreatitis and was as low as 125 lb.  Since homeTPN started late 2021, she gained back some weight and has been maintaining at 135 lbs.  Patient remains on an 18-hr cycle TPN since discharge in Dec and has been on a liquid diet.  Any solid food would cause abdominal pain.  She missed 2 doses of TPN while holding in the ED.  Glucose / Insulin: no hx DM.  TPN cycle reduced to 20 hrs on 10/02/19 d/t hypoglycemia off TPN.  SSI D/C'ed 10/01/19.  CBGs controlled - still on lower end Electrolytes: Phos elevated at 4.9 (1/17, none in TPN, Ca x Phos = 49.6, goal < 55), others WNL Renal: SCr < 1, BUN WNL LFTs / TGs: AST / tbili WNL, alk phos up to 167, ALT up to 63, TG down to 151 with no lipids in TPN on 1/13-1/16 Prealbumin / albumin: albumin 3.2, prealbumin WNL at 29.7, lipase up to 159 Intake / Output; MIVF: LBM 1/13 (high dose opiates), LR at 100 ml/hr, no I/O's recorded GI Imaging:  1/6 CT: acute non-necrotizing pancreatitis, peripancreatic fluid collection and developing pseudocyst 1/8 MRCP - pancreatic cyst, mildly increased in size GI meds: Creon TID, IV PPI BID, Sucralfate decreased to BID on 1/15, off Colestipol 1/15 d/t constipation, Miralax x1 on 1/15 Surgeries /  Procedures:  1/13: EGD, upper EUS, ERCP, sphincterotomy, removal of stones, pancreatic stent placement.  Central access: PICC placed 08/23/20 TPN start date: chronic/home  Nutritional Goals (per RD rec on 1/17): 1950-2150 kCal, 105-120g protein, >1.9L fluid per day  TPN formula from [facility]:  2034mL over 18 hrs:  102g AA, 260g CHO and 40g ILE for a total of 1692 kCal per day Lytes: increasing K needs, mag 41mEq/d  Current Nutrition:  TPN Full liquid diet Boost PO TID - 3 charted given yesterday  Plan:  Plan is for discharge today, 1/19 Discussed patient with [name] at [facility] has been prepared for the patient for today Pharmacy will continue to monitor peripherally  Per GI, if no improvement in the next several weeks, may need pancreatic surgery at tertiary center  Thank you for allowing pharmacy to be a part of this patient's care.  Alycia Rossetti, PharmD, BCPS Clinical Pharmacist Clinical phone for 10/09/2020: [phone] 10/09/2020 8:22 AM   **Pharmacist phone directory can now be found on [email or web address] ([email or web address]).  Listed under [place].

## 2020-10-09 NOTE — Progress Notes (Signed)
Plans for discharge noted.  Unfortunately, patient is having more pain today.  I spent quite a bit of time explaining to the patient various possible causes for pain, and parameters for which diagnostic evaluation would be needed (specifically, a marked change in the intensity or character of the pain).  The patient indicates that her son heard from the [facility] referral center yesterday, so it does appear that her outpatient evaluation there is moving forward, although it does not sound as though a specific clinic appointment has yet been provided to the patient.  I asked the patient that she contact our office once she has a specific appointment at [facility], to let us know the time and date of that appointment.    I also recommended that she go ahead and get scheduled for a routine office follow-up appointment at our office (either with Dr. Ewing Schlein (who has seen her as an outpatient and done procedures on her), Dr. Bosie Clos (who she thinks has seen her the most while in the hospital), or our physician assistant who knows her well from the hospital, Michel Santee.  Tricia Brown, M.D. Pager [phone] If no answer or after 5 PM call [phone]

## 2020-10-09 NOTE — Care Management (Signed)
Tricia Brown with [facility] confirmed patient can be discharged to home today and home TPN is ready for this evening   Ronny Flurry RN

## 2020-10-09 NOTE — Discharge Summary (Signed)
Physician Discharge Summary  Tricia Brown AYT:[id number] DOB: 06-Dec-1975 DOA: 09/25/2020  PCP: [name], MD  Admit date: 09/25/2020 Discharge date: 10/09/2020  Admitted From: Home  Discharge disposition: Home  Recommendations for Outpatient Follow-Up:   . Follow up with your primary care provider in one week.  . Check CBC, BMP, magnesium in the next visit . Please follow-up with Dr. Ewing Schlein GI as scheduled by the clinic follow-up of stent. .   Follow-up with [facility] for ongoing treatment of your pancreatitis.   Discharge Diagnosis:   Principal Problem:   Acute pancreatitis Active Problems:   Depression   Opioid use disorder, severe, dependence (HCC)   Discharge Condition: Improved.  Diet recommendation: Full liquids, low fat diet  Wound care: None.  Code status: Full.   History of Present Illness:   45 year old female with medical history of pancreatitis, chronic pain on methadone, depression presented to the hospital with worsening abdominal pain. 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 She was most recently treated with meropenem and MS Contin and breakthrough Oxy IR. She was discharged home with TPN. Since discharge she has been on TPN IV for 18 hours a day. She was also on Carafate 4 times daily, Protonix once daily, Zofran 3 times daily, Bentyl, pancreatic enzymes twice daily.  On this admission, she had diarrhea unable to hold anything down with progressive weakness for 1 week.  She was then brought into the hospital.  In the ED, patient was hemodynamically stable and afebrile. CT showed acute nonnecrotizing pancreatitis with peripancreatic fluid collection. She was admitted to hospitalist service and GI was consulted. MRCP was done which showed enlarging cyst at the tail of pancreas. Patient underwent  pancreatic stent and celiac nerve block procedure.  Hospital Course:   Following conditions were addressed during hospitalization as listed below,  Acute/recurrent pancreatitis-acute severe nonnecrotizing pancreatitis. MRCP showed enlarging cyst at tail of pancreas. Patient underwent ERCP  with biliary sphincterotomy and pancreatic stent placement.   Additionally, patient also had celiac plexus nerve block. 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   Patient has been continued on TPN.  Continue pain control with oxycodone.  Dose has been increased to every 4 hourly on discharge..   Off colestipol due to constipation.  Sucralfate dose has been decreased to constipation.  GI has seen the patient at this time and recommend discharge home with outpatient follow-up.  Patient has been referred to [name] for management of complicated pancreatitis, GI to follow on this morning.  Patient will also need to follow-up with Dr. Ewing Schlein GI for pancreatic duct stent removal or exchange after discharge.  COPD- continue Symbicort.  Hypokalemia/hypomagnesemia- resolved.  Disposition.  At this time, patient is stable for disposition home.  Patient will need to follow-up with her primary care physician, GI as outpatient.  Medical Consultants:    GI  Procedures:     ERCP, status post biliary sphincterotomy and pancreatic stent placement. Celiac plexus block performed on 10/03/2020  Subjective:   Today, patient was seen and examined at bedside.  Has baseline nausea and abdominal pain.  Wishes to go home.  Discharge Exam:   Vitals:   10/08/20 2026 10/09/20 0450  BP: 133/74 116/64  Pulse: 86 90  Resp: 17 20  Temp: 98.7 F (37.1 C) 99 F (37.2 C)  SpO2: 100% 100%   Vitals:   10/08/20  4098 10/08/20 1429 10/08/20 2026 10/09/20 0450  BP: (!) 121/59 114/72 133/74 116/64  Pulse: 92 82 86 90  Resp: 17 16 17 20   Temp: (!) 97.5 F (36.4 C) 97.9 F  (36.6 C) 98.7 F (37.1 C) 99 F (37.2 C)  TempSrc: Oral Oral Oral Oral  SpO2: 99% 100% 100% 100%  Weight:      Height:       General: Alert awake, not in obvious distress thinly built HENT: pupils equally reacting to light,  No scleral pallor or icterus noted. Oral mucosa is moist.  Chest:  Clear breath sounds.  Diminished breath sounds bilaterally. No crackles or wheezes.  CVS: S1 &S2 heard. No murmur.  Regular rate and rhythm. Abdomen: Soft, mild epigastric tenderness, nondistended.  Bowel sounds are heard.   Extremities: No cyanosis, clubbing or edema.  Peripheral pulses are palpable. Psych: Alert, awake and oriented, normal mood CNS:  No cranial nerve deficits.  Power equal in all extremities.   Skin: Warm and dry.  No rashes noted.  The results of significant diagnostics from this hospitalization (including imaging, microbiology, ancillary and laboratory) are listed below for reference.     Diagnostic Studies:   CT ABDOMEN PELVIS W CONTRAST  Result Date: 09/26/2020 CLINICAL DATA:  Acute on chronic pancreatitis with the gastric abdominal pain, nausea and vomiting, on TPN. Prior cholecystectomy and hysterectomy. EXAM: CT ABDOMEN AND PELVIS WITH CONTRAST TECHNIQUE: Multidetector CT imaging of the abdomen and pelvis was performed using the standard protocol following bolus administration of intravenous contrast. CONTRAST:  73mL OMNIPAQUE IOHEXOL 300 MG/ML  SOLN COMPARISON:  09/06/2020 CT abdomen/pelvis. FINDINGS: Lower chest: No significant pulmonary nodules or acute consolidative airspace disease. Hepatobiliary: Normal liver size. No liver mass. Cholecystectomy. No biliary ductal dilatation. CBD diameter 3 mm. Pancreas: Mild fullness of the distal pancreatic body and pancreatic tail with associated mild haziness of the peripancreatic fat, compatible with acute pancreatitis. Simple 4.5 x 1.8 cm peripancreatic fluid collection in the lesser sac (series 3/image 19), new. Preserved pancreatic  parenchymal enhancement. No pancreatic mass or duct dilation. Spleen: Normal size. No mass. Adrenals/Urinary Tract: Normal adrenals. Normal kidneys with no hydronephrosis and no renal mass. Normal bladder. Stomach/Bowel: Normal non-distended stomach. Normal caliber small bowel with no small bowel wall thickening. Normal appendix. Normal large bowel with no diverticulosis, large bowel wall thickening or pericolonic fat stranding. Vascular/Lymphatic: Normal caliber abdominal aorta. Patent portal, splenic, hepatic and renal veins. No pathologically enlarged lymph nodes in the abdomen or pelvis. Reproductive: Status post hysterectomy, with no abnormal findings at the vaginal cuff. No adnexal mass. Other: No pneumoperitoneum, ascites or focal fluid collection. Musculoskeletal: No aggressive appearing focal osseous lesions. Partially visualized healing anterior right seventh rib fracture. IMPRESSION: 1. Acute non-necrotizing pancreatitis. Simple 4.5 x 1.8 cm peripancreatic fluid collection in the lesser sac, new, compatible with an inflammatory fluid collection/developing pseudocyst. Suggest follow-up MRI abdomen without and with IV contrast in 3 months. 2. No biliary or pancreatic ductal dilatation. 3. Partially visualized healing anterior right seventh rib fracture. Electronically Signed   By: 09/08/2020 M.D.   On: 09/26/2020 10:00     Labs:   Basic Metabolic Panel: Recent Labs  Lab 10/03/20 0312 10/05/20 0312 10/07/20 0320 10/09/20 0337  NA 140 139 138 136  K 4.3 4.6 4.6 4.2  CL 103 106 101 102  CO2 28 24 27 24   GLUCOSE 99 103* 110* 100*  BUN 17 20 17 20   CREATININE 0.64 0.70 0.62 0.71  CALCIUM 9.2  9.4 9.5 9.4  MG 1.9  --  1.8  --   PHOS 5.4* 4.3 4.9*  --    GFR Estimated Creatinine Clearance: 87.3 mL/min (by C-G formula based on SCr of 0.71 mg/dL). Liver Function Tests: Recent Labs  Lab 10/03/20 0312 10/04/20 1243 10/07/20 0320  AST 16 22 32  ALT 25 47* 63*  ALKPHOS 131* 131* 167*   BILITOT 0.3 0.3 0.3  PROT 5.9* 6.4* 6.2*  ALBUMIN 3.3* 3.4* 3.2*   Recent Labs  Lab 10/05/20 0312  LIPASE 159*   No results for input(s): AMMONIA in the last 168 hours. Coagulation profile No results for input(s): INR, PROTIME in the last 168 hours.  CBC: Recent Labs  Lab 10/04/20 1243 10/07/20 0320 10/09/20 0337  WBC 17.7* 11.3* 9.6  NEUTROABS 14.8* 7.8*  --   HGB 12.7 11.2* 12.2  HCT 39.7 35.8* 36.4  MCV 92.5 94.2 92.4  PLT 374 345 393   Cardiac Enzymes: No results for input(s): CKTOTAL, CKMB, CKMBINDEX, TROPONINI in the last 168 hours. BNP: Invalid input(s): POCBNP CBG: Recent Labs  Lab 10/08/20 0610 10/08/20 0814 10/08/20 1222 10/08/20 2354 10/09/20 0759  GLUCAP 114* 110* 82 113* 100*   D-Dimer No results for input(s): DDIMER in the last 72 hours. Hgb A1c No results for input(s): HGBA1C in the last 72 hours. Lipid Profile Recent Labs    10/07/20 0320  TRIG 151*   Thyroid function studies No results for input(s): TSH, T4TOTAL, T3FREE, THYROIDAB in the last 72 hours.  Invalid input(s): FREET3 Anemia work up No results for input(s): VITAMINB12, FOLATE, FERRITIN, TIBC, IRON, RETICCTPCT in the last 72 hours. Microbiology No results found for this or any previous visit (from the past 240 hour(s)).   Discharge Instructions:   Discharge Instructions    Call MD for:  persistant nausea and vomiting   Complete by: As directed    Call MD for:  severe uncontrolled pain   Complete by: As directed    Diet full liquid   Complete by: As directed    Discharge instructions   Complete by: As directed    Follow-up with your pain management clinic as soon as possible.  Stay on liquid diet and advance as tolerated.  Continue TPN at home.  [facility]  Dose of sucralfate has been decreased to twice a day.  Do not take colestipol if you have constipation.   Increase activity slowly   Complete by: As directed       Allergies as of 10/09/2020   No Known Allergies     Medication List    TAKE these medications   budesonide-formoterol 80-4.5 MCG/ACT inhaler Commonly known as: SYMBICORT Inhale 1 puff into the lungs daily.   colestipol 1 g tablet Commonly known as: COLESTID Take 1 tablet (1 g total) by mouth 2 (two) times daily. Hold if constipation What changed: additional instructions   dicyclomine 10 MG capsule Commonly known as: BENTYL Take 1 capsule (10 mg total) by mouth 4 (four) times daily as needed for spasms.   estradiol 1 MG tablet Commonly known as: ESTRACE Take 1 mg by mouth daily.   lipase/protease/amylase [id number] UNITS Cpep capsule Commonly known as: CREON Take 2 capsules (72,000 Units total) by mouth daily. What changed:   how much to take  when to take this   montelukast 10 MG tablet Commonly known as: SINGULAIR Take 10 mg by mouth daily.   MULTIVITAMIN ADULT PO Take  1 tablet by mouth daily.   ondansetron 4 MG tablet Commonly known as: ZOFRAN Take 1 tablet (4 mg total) by mouth every 8 (eight) hours as needed for nausea or vomiting.   oxyCODONE 15 MG immediate release tablet Commonly known as: ROXICODONE Take 1 tablet (15 mg total) by mouth every 4 (four) hours as needed. What changed: when to take this   pantoprazole 40 MG tablet Commonly known as: PROTONIX Take 1 tablet (40 mg total) by mouth 2 (two) times daily before a meal.   polyethylene glycol powder 17 GM/SCOOP powder Commonly known as: MiraLax Take 17 g by mouth 2 (two) times daily as needed for moderate constipation.   sucralfate 1 g tablet Commonly known as: CARAFATE Take 1 tablet (1 g total) by mouth 2 (two) times daily. What changed: when to take this   valACYclovir 500 MG tablet Commonly known as: VALTREX Take 500 mg by mouth as needed (outbreaks).         Time coordinating discharge: 39 minutes  Signed:  Okie Jansson  [facility] 10/09/2020, 9:00  AM

## 2020-11-04 DIAGNOSIS — F17200 Nicotine dependence, unspecified, uncomplicated: Secondary | ICD-10-CM | POA: Insufficient documentation

## 2020-11-29 DIAGNOSIS — K219 Gastro-esophageal reflux disease without esophagitis: Secondary | ICD-10-CM | POA: Insufficient documentation

## 2020-11-29 DIAGNOSIS — F1091 Alcohol use, unspecified, in remission: Secondary | ICD-10-CM | POA: Insufficient documentation

## 2020-11-29 DIAGNOSIS — F1020 Alcohol dependence, uncomplicated: Secondary | ICD-10-CM | POA: Insufficient documentation

## 2021-01-28 ENCOUNTER — Other Ambulatory Visit: Payer: Self-pay | Admitting: Physician Assistant

## 2021-01-28 DIAGNOSIS — Z1231 Encounter for screening mammogram for malignant neoplasm of breast: Secondary | ICD-10-CM

## 2021-03-09 ENCOUNTER — Encounter (HOSPITAL_BASED_OUTPATIENT_CLINIC_OR_DEPARTMENT_OTHER): Payer: Self-pay

## 2021-03-09 ENCOUNTER — Other Ambulatory Visit: Payer: Self-pay

## 2021-03-09 ENCOUNTER — Inpatient Hospital Stay (HOSPITAL_BASED_OUTPATIENT_CLINIC_OR_DEPARTMENT_OTHER)
Admission: EM | Admit: 2021-03-09 | Discharge: 2021-03-15 | DRG: 439 | Disposition: A | Discharge status: Home / Self Care | Payer: Medicaid Other | Attending: Family Medicine | Admitting: Family Medicine

## 2021-03-09 ENCOUNTER — Emergency Department (HOSPITAL_BASED_OUTPATIENT_CLINIC_OR_DEPARTMENT_OTHER): Payer: Medicaid Other

## 2021-03-09 DIAGNOSIS — Z7901 Long term (current) use of anticoagulants: Secondary | ICD-10-CM

## 2021-03-09 DIAGNOSIS — K580 Irritable bowel syndrome with diarrhea: Secondary | ICD-10-CM | POA: Diagnosis present

## 2021-03-09 DIAGNOSIS — Z79899 Other long term (current) drug therapy: Secondary | ICD-10-CM

## 2021-03-09 DIAGNOSIS — Z8614 Personal history of Methicillin resistant Staphylococcus aureus infection: Secondary | ICD-10-CM

## 2021-03-09 DIAGNOSIS — K859 Acute pancreatitis without necrosis or infection, unspecified: Principal | ICD-10-CM | POA: Diagnosis present

## 2021-03-09 DIAGNOSIS — F339 Major depressive disorder, recurrent, unspecified: Secondary | ICD-10-CM | POA: Diagnosis present

## 2021-03-09 DIAGNOSIS — R109 Unspecified abdominal pain: Secondary | ICD-10-CM | POA: Diagnosis present

## 2021-03-09 DIAGNOSIS — Z79890 Hormone replacement therapy (postmenopausal): Secondary | ICD-10-CM

## 2021-03-09 DIAGNOSIS — Z87891 Personal history of nicotine dependence: Secondary | ICD-10-CM

## 2021-03-09 DIAGNOSIS — Z20822 Contact with and (suspected) exposure to covid-19: Secondary | ICD-10-CM | POA: Diagnosis present

## 2021-03-09 DIAGNOSIS — F39 Unspecified mood [affective] disorder: Secondary | ICD-10-CM | POA: Diagnosis present

## 2021-03-09 DIAGNOSIS — Z86718 Personal history of other venous thrombosis and embolism: Secondary | ICD-10-CM

## 2021-03-09 DIAGNOSIS — Z9079 Acquired absence of other genital organ(s): Secondary | ICD-10-CM

## 2021-03-09 DIAGNOSIS — Z9049 Acquired absence of other specified parts of digestive tract: Secondary | ICD-10-CM

## 2021-03-09 DIAGNOSIS — I82890 Acute embolism and thrombosis of other specified veins: Secondary | ICD-10-CM | POA: Diagnosis present

## 2021-03-09 DIAGNOSIS — F1120 Opioid dependence, uncomplicated: Secondary | ICD-10-CM | POA: Diagnosis present

## 2021-03-09 DIAGNOSIS — G894 Chronic pain syndrome: Secondary | ICD-10-CM | POA: Diagnosis present

## 2021-03-09 DIAGNOSIS — K863 Pseudocyst of pancreas: Secondary | ICD-10-CM | POA: Diagnosis present

## 2021-03-09 DIAGNOSIS — Z560 Unemployment, unspecified: Secondary | ICD-10-CM

## 2021-03-09 DIAGNOSIS — Z7951 Long term (current) use of inhaled steroids: Secondary | ICD-10-CM

## 2021-03-09 DIAGNOSIS — K861 Other chronic pancreatitis: Secondary | ICD-10-CM

## 2021-03-09 DIAGNOSIS — Z87440 Personal history of urinary (tract) infections: Secondary | ICD-10-CM

## 2021-03-09 DIAGNOSIS — Z90722 Acquired absence of ovaries, bilateral: Secondary | ICD-10-CM

## 2021-03-09 LAB — URINALYSIS, ROUTINE W REFLEX MICROSCOPIC
Bilirubin Urine: NEGATIVE
Glucose, UA: NEGATIVE mg/dL
Ketones, ur: NEGATIVE mg/dL
Leukocytes,Ua: NEGATIVE
Nitrite: NEGATIVE
Protein, ur: 30 mg/dL — AB
Specific Gravity, Urine: 1.016 (ref 1.005–1.030)
pH: 7 (ref 5.0–8.0)

## 2021-03-09 LAB — CBC WITH DIFFERENTIAL/PLATELET
Abs Immature Granulocytes: 0.02 10*3/uL (ref 0.00–0.07)
Basophils Absolute: 0.1 10*3/uL (ref 0.0–0.1)
Basophils Relative: 1 %
Eosinophils Absolute: 0.3 10*3/uL (ref 0.0–0.5)
Eosinophils Relative: 5 %
HCT: 43.6 % (ref 36.0–46.0)
Hemoglobin: 14.2 g/dL (ref 12.0–15.0)
Immature Granulocytes: 0 %
Lymphocytes Relative: 18 %
Lymphs Abs: 1.2 10*3/uL (ref 0.7–4.0)
MCH: 28.5 pg (ref 26.0–34.0)
MCHC: 32.6 g/dL (ref 30.0–36.0)
MCV: 87.4 fL (ref 80.0–100.0)
Monocytes Absolute: 0.5 10*3/uL (ref 0.1–1.0)
Monocytes Relative: 7 %
Neutro Abs: 4.7 10*3/uL (ref 1.7–7.7)
Neutrophils Relative %: 69 %
Platelets: 262 10*3/uL (ref 150–400)
RBC: 4.99 MIL/uL (ref 3.87–5.11)
RDW: 12.6 % (ref 11.5–15.5)
WBC: 6.8 10*3/uL (ref 4.0–10.5)
nRBC: 0 % (ref 0.0–0.2)

## 2021-03-09 LAB — COMPREHENSIVE METABOLIC PANEL
ALT: 26 U/L (ref 0–44)
AST: 31 U/L (ref 15–41)
Albumin: 4.2 g/dL (ref 3.5–5.0)
Alkaline Phosphatase: 193 U/L — ABNORMAL HIGH (ref 38–126)
Anion gap: 9 (ref 5–15)
BUN: 6 mg/dL (ref 6–20)
CO2: 27 mmol/L (ref 22–32)
Calcium: 10.1 mg/dL (ref 8.9–10.3)
Chloride: 100 mmol/L (ref 98–111)
Creatinine, Ser: 0.95 mg/dL (ref 0.44–1.00)
GFR, Estimated: >60 mL/min (ref >60)
Glucose, Bld: 85 mg/dL (ref 70–99)
Potassium: 3.5 mmol/L (ref 3.5–5.1)
Sodium: 136 mmol/L (ref 135–145)
Total Bilirubin: 0.4 mg/dL (ref 0.3–1.2)
Total Protein: 7.5 g/dL (ref 6.5–8.1)

## 2021-03-09 LAB — PREGNANCY, URINE: Preg Test, Ur: NEGATIVE

## 2021-03-09 LAB — LIPASE, BLOOD: Lipase: 42 U/L (ref 11–51)

## 2021-03-09 MED ORDER — ONDANSETRON HCL 4 MG/2ML IJ SOLN
4.0000 mg | Freq: Once | INTRAMUSCULAR | Status: AC
  Administered 2021-03-09: 4 mg via INTRAVENOUS
  Filled 2021-03-09: qty 2

## 2021-03-09 MED ORDER — MORPHINE SULFATE (PF) 4 MG/ML IV SOLN
4.0000 mg | INTRAVENOUS | Status: DC | PRN
  Administered 2021-03-10 – 2021-03-14 (×39): 4 mg via INTRAVENOUS
  Filled 2021-03-09 (×39): qty 1

## 2021-03-09 MED ORDER — LACTATED RINGERS IV SOLN: INTRAVENOUS | Status: DC

## 2021-03-09 MED ORDER — MORPHINE SULFATE (PF) 4 MG/ML IV SOLN
4.0000 mg | Freq: Once | INTRAVENOUS | Status: AC
  Administered 2021-03-09: 4 mg via INTRAVENOUS
  Filled 2021-03-09: qty 1

## 2021-03-09 MED ORDER — SODIUM CHLORIDE 0.9 % IV BOLUS
1000.0000 mL | Freq: Once | INTRAVENOUS | Status: AC
  Administered 2021-03-09: 1000 mL via INTRAVENOUS

## 2021-03-09 MED ORDER — MORPHINE SULFATE (PF) 4 MG/ML IV SOLN
4.0000 mg | Freq: Once | INTRAVENOUS | Status: AC
Start: 2021-03-09 — End: 2021-03-09
  Administered 2021-03-09: 4 mg via INTRAVENOUS
  Filled 2021-03-09: qty 1

## 2021-03-09 MED ORDER — IOHEXOL 300 MG/ML  SOLN
75.0000 mL | Freq: Once | INTRAMUSCULAR | Status: AC | PRN
  Administered 2021-03-09: 75 mL via INTRAVENOUS

## 2021-03-09 MED ORDER — DICYCLOMINE HCL 10 MG/ML IM SOLN
20.0000 mg | Freq: Once | INTRAMUSCULAR | Status: AC
  Administered 2021-03-10: 20 mg via INTRAMUSCULAR
  Filled 2021-03-09: qty 2

## 2021-03-09 MED ORDER — ONDANSETRON HCL 4 MG/2ML IJ SOLN
4.0000 mg | Freq: Four times a day (QID) | INTRAMUSCULAR | Status: DC | PRN
  Administered 2021-03-10 – 2021-03-11 (×2): 4 mg via INTRAVENOUS
  Filled 2021-03-09 (×3): qty 2

## 2021-03-09 MED ORDER — IOHEXOL 9 MG/ML PO SOLN
500.0000 mL | ORAL | Status: AC
  Administered 2021-03-09 (×2): 500 mL via ORAL

## 2021-03-09 NOTE — ED Notes (Signed)
Unable to obtain IV , hard stick , pt reports usually gets [place] IV . Hx central line
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
## 2021-03-09 NOTE — ED Notes (Signed)
Pt returned from CT °

## 2021-03-09 NOTE — ED Triage Notes (Signed)
Reports frequency and dysuria for past few days. She also tells me she has abd. Pain "every day because I have chronic pancreatitis". She is ambulatory and in no distress.

## 2021-03-10 ENCOUNTER — Encounter (HOSPITAL_COMMUNITY): Payer: Self-pay | Admitting: Family Medicine

## 2021-03-10 DIAGNOSIS — Z8614 Personal history of Methicillin resistant Staphylococcus aureus infection: Secondary | ICD-10-CM | POA: Diagnosis not present

## 2021-03-10 DIAGNOSIS — K580 Irritable bowel syndrome with diarrhea: Secondary | ICD-10-CM | POA: Diagnosis present

## 2021-03-10 DIAGNOSIS — K859 Acute pancreatitis without necrosis or infection, unspecified: Secondary | ICD-10-CM | POA: Diagnosis not present

## 2021-03-10 DIAGNOSIS — Z79890 Hormone replacement therapy (postmenopausal): Secondary | ICD-10-CM | POA: Diagnosis not present

## 2021-03-10 DIAGNOSIS — R197 Diarrhea, unspecified: Secondary | ICD-10-CM

## 2021-03-10 DIAGNOSIS — Z560 Unemployment, unspecified: Secondary | ICD-10-CM | POA: Diagnosis not present

## 2021-03-10 DIAGNOSIS — Z86718 Personal history of other venous thrombosis and embolism: Secondary | ICD-10-CM | POA: Diagnosis not present

## 2021-03-10 DIAGNOSIS — R1013 Epigastric pain: Secondary | ICD-10-CM | POA: Diagnosis not present

## 2021-03-10 DIAGNOSIS — F39 Unspecified mood [affective] disorder: Secondary | ICD-10-CM | POA: Diagnosis present

## 2021-03-10 DIAGNOSIS — K861 Other chronic pancreatitis: Secondary | ICD-10-CM

## 2021-03-10 DIAGNOSIS — Z87891 Personal history of nicotine dependence: Secondary | ICD-10-CM | POA: Diagnosis not present

## 2021-03-10 DIAGNOSIS — I82721 Chronic embolism and thrombosis of deep veins of right upper extremity: Secondary | ICD-10-CM

## 2021-03-10 DIAGNOSIS — Z20822 Contact with and (suspected) exposure to covid-19: Secondary | ICD-10-CM | POA: Diagnosis present

## 2021-03-10 DIAGNOSIS — G894 Chronic pain syndrome: Secondary | ICD-10-CM | POA: Diagnosis present

## 2021-03-10 DIAGNOSIS — F1120 Opioid dependence, uncomplicated: Secondary | ICD-10-CM | POA: Diagnosis present

## 2021-03-10 DIAGNOSIS — I82890 Acute embolism and thrombosis of other specified veins: Secondary | ICD-10-CM

## 2021-03-10 DIAGNOSIS — Z79899 Other long term (current) drug therapy: Secondary | ICD-10-CM | POA: Diagnosis not present

## 2021-03-10 DIAGNOSIS — K863 Pseudocyst of pancreas: Secondary | ICD-10-CM | POA: Diagnosis present

## 2021-03-10 DIAGNOSIS — Z90722 Acquired absence of ovaries, bilateral: Secondary | ICD-10-CM | POA: Diagnosis not present

## 2021-03-10 DIAGNOSIS — Z9049 Acquired absence of other specified parts of digestive tract: Secondary | ICD-10-CM | POA: Diagnosis not present

## 2021-03-10 DIAGNOSIS — Z7901 Long term (current) use of anticoagulants: Secondary | ICD-10-CM | POA: Diagnosis not present

## 2021-03-10 DIAGNOSIS — Z9079 Acquired absence of other genital organ(s): Secondary | ICD-10-CM | POA: Diagnosis not present

## 2021-03-10 DIAGNOSIS — F339 Major depressive disorder, recurrent, unspecified: Secondary | ICD-10-CM | POA: Diagnosis present

## 2021-03-10 DIAGNOSIS — Z7951 Long term (current) use of inhaled steroids: Secondary | ICD-10-CM | POA: Diagnosis not present

## 2021-03-10 DIAGNOSIS — Z87440 Personal history of urinary (tract) infections: Secondary | ICD-10-CM | POA: Diagnosis not present

## 2021-03-10 LAB — COMPREHENSIVE METABOLIC PANEL
ALT: 30 U/L (ref 0–44)
AST: 33 U/L (ref 15–41)
Albumin: 3.4 g/dL — ABNORMAL LOW (ref 3.5–5.0)
Alkaline Phosphatase: 166 U/L — ABNORMAL HIGH (ref 38–126)
Anion gap: 7 (ref 5–15)
BUN: 6 mg/dL (ref 6–20)
CO2: 25 mmol/L (ref 22–32)
Calcium: 9.2 mg/dL (ref 8.9–10.3)
Chloride: 106 mmol/L (ref 98–111)
Creatinine, Ser: 0.74 mg/dL (ref 0.44–1.00)
GFR, Estimated: >60 mL/min (ref >60)
Glucose, Bld: 84 mg/dL (ref 70–99)
Potassium: 3.5 mmol/L (ref 3.5–5.1)
Sodium: 138 mmol/L (ref 135–145)
Total Bilirubin: 0.4 mg/dL (ref 0.3–1.2)
Total Protein: 6.3 g/dL — ABNORMAL LOW (ref 6.5–8.1)

## 2021-03-10 LAB — CBC
HCT: 38.7 % (ref 36.0–46.0)
Hemoglobin: 12.2 g/dL (ref 12.0–15.0)
MCH: 28.8 pg (ref 26.0–34.0)
MCHC: 31.5 g/dL (ref 30.0–36.0)
MCV: 91.3 fL (ref 80.0–100.0)
Platelets: 204 10*3/uL (ref 150–400)
RBC: 4.24 MIL/uL (ref 3.87–5.11)
RDW: 12.5 % (ref 11.5–15.5)
WBC: 4.5 10*3/uL (ref 4.0–10.5)
nRBC: 0 % (ref 0.0–0.2)

## 2021-03-10 LAB — C DIFFICILE QUICK SCREEN W PCR REFLEX
C Diff antigen: NEGATIVE
C Diff interpretation: NOT DETECTED
C Diff toxin: NEGATIVE

## 2021-03-10 LAB — RESP PANEL BY RT-PCR (FLU A&B, COVID) ARPGX2
Influenza A by PCR: NEGATIVE
Influenza B by PCR: NEGATIVE
SARS Coronavirus 2 by RT PCR: NEGATIVE

## 2021-03-10 MED ORDER — FLUTICASONE FUROATE-VILANTEROL 100-25 MCG/INH IN AEPB
1.0000 | INHALATION_SPRAY | Freq: Every day | RESPIRATORY_TRACT | Status: DC
  Administered 2021-03-11 – 2021-03-15 (×5): 1 via RESPIRATORY_TRACT
  Filled 2021-03-10: qty 28

## 2021-03-10 MED ORDER — ADULT MULTIVITAMIN W/MINERALS CH
1.0000 | ORAL_TABLET | Freq: Every day | ORAL | Status: DC
  Administered 2021-03-10 – 2021-03-15 (×6): 1 via ORAL
  Filled 2021-03-10 (×6): qty 1

## 2021-03-10 MED ORDER — APIXABAN 5 MG PO TABS
5.0000 mg | ORAL_TABLET | Freq: Two times a day (BID) | ORAL | Status: DC
  Administered 2021-03-10 – 2021-03-15 (×11): 5 mg via ORAL
  Filled 2021-03-10 (×11): qty 1

## 2021-03-10 MED ORDER — ONDANSETRON HCL 4 MG/2ML IJ SOLN
4.0000 mg | Freq: Four times a day (QID) | INTRAMUSCULAR | Status: DC | PRN
  Administered 2021-03-10 – 2021-03-14 (×11): 4 mg via INTRAVENOUS
  Filled 2021-03-10 (×10): qty 2

## 2021-03-10 MED ORDER — ENOXAPARIN SODIUM 40 MG/0.4ML IJ SOSY
40.0000 mg | PREFILLED_SYRINGE | INTRAMUSCULAR | Status: DC
  Administered 2021-03-10: 40 mg via SUBCUTANEOUS
  Filled 2021-03-10: qty 0.4

## 2021-03-10 MED ORDER — COLESTIPOL HCL 1 G PO TABS
1.0000 g | ORAL_TABLET | Freq: Two times a day (BID) | ORAL | Status: DC
  Administered 2021-03-10 – 2021-03-15 (×10): 1 g via ORAL
  Filled 2021-03-10 (×11): qty 1

## 2021-03-10 MED ORDER — HYDROMORPHONE HCL 1 MG/ML IJ SOLN
1.0000 mg | INTRAMUSCULAR | Status: AC | PRN
Start: 2021-03-10 — End: 2021-03-10
  Administered 2021-03-10 (×4): 1 mg via INTRAVENOUS
  Filled 2021-03-10 (×4): qty 1

## 2021-03-10 MED ORDER — ONDANSETRON HCL 4 MG PO TABS
4.0000 mg | ORAL_TABLET | Freq: Four times a day (QID) | ORAL | Status: DC | PRN
  Administered 2021-03-12 – 2021-03-15 (×3): 4 mg via ORAL
  Filled 2021-03-10 (×3): qty 1

## 2021-03-10 MED ORDER — QUETIAPINE FUMARATE ER 200 MG PO TB24
400.0000 mg | ORAL_TABLET | Freq: Every day | ORAL | Status: DC
  Administered 2021-03-10 – 2021-03-14 (×5): 400 mg via ORAL
  Filled 2021-03-10 (×5): qty 2

## 2021-03-10 MED ORDER — PANTOPRAZOLE SODIUM 40 MG PO TBEC
40.0000 mg | DELAYED_RELEASE_TABLET | Freq: Two times a day (BID) | ORAL | Status: DC
  Administered 2021-03-10 – 2021-03-15 (×11): 40 mg via ORAL
  Filled 2021-03-10 (×11): qty 1

## 2021-03-10 MED ORDER — PANCRELIPASE (LIP-PROT-AMYL) 12000-38000 UNITS PO CPEP
36000.0000 [IU] | ORAL_CAPSULE | Freq: Three times a day (TID) | ORAL | Status: DC
  Administered 2021-03-10 – 2021-03-15 (×12): 36000 [IU] via ORAL
  Filled 2021-03-10 (×13): qty 3

## 2021-03-10 MED ORDER — LACTATED RINGERS IV SOLN: INTRAVENOUS | Status: DC

## 2021-03-10 MED ORDER — BUSPIRONE HCL 5 MG PO TABS
15.0000 mg | ORAL_TABLET | Freq: Three times a day (TID) | ORAL | Status: DC
  Administered 2021-03-10 – 2021-03-15 (×15): 15 mg via ORAL
  Filled 2021-03-10 (×15): qty 1

## 2021-03-10 MED ORDER — MIRTAZAPINE 15 MG PO TABS
15.0000 mg | ORAL_TABLET | Freq: Every day | ORAL | Status: DC
  Administered 2021-03-10 – 2021-03-14 (×5): 15 mg via ORAL
  Filled 2021-03-10 (×5): qty 1

## 2021-03-10 MED ORDER — FLUTICASONE FUROATE-VILANTEROL 100-25 MCG/INH IN AEPB
1.0000 | INHALATION_SPRAY | Freq: Every day | RESPIRATORY_TRACT | Status: DC
  Filled 2021-03-10: qty 28

## 2021-03-10 MED ORDER — MONTELUKAST SODIUM 10 MG PO TABS
10.0000 mg | ORAL_TABLET | Freq: Every day | ORAL | Status: DC
  Administered 2021-03-10 – 2021-03-15 (×6): 10 mg via ORAL
  Filled 2021-03-10 (×6): qty 1

## 2021-03-10 MED ORDER — DULOXETINE HCL 30 MG PO CPEP
60.0000 mg | ORAL_CAPSULE | Freq: Every day | ORAL | Status: DC
  Administered 2021-03-10 – 2021-03-15 (×6): 60 mg via ORAL
  Filled 2021-03-10 (×6): qty 2

## 2021-03-10 MED ORDER — SODIUM CHLORIDE 0.9 % IV SOLN
12.5000 mg | Freq: Three times a day (TID) | INTRAVENOUS | Status: DC | PRN
  Administered 2021-03-10 – 2021-03-11 (×4): 12.5 mg via INTRAVENOUS
  Filled 2021-03-10 (×4): qty 12.5

## 2021-03-10 NOTE — ED Notes (Signed)
Received inbound call from [facility] RN Rupinder and provided report for transfer.

## 2021-03-10 NOTE — Progress Notes (Signed)
Patient arrived to room 1518 via [facility] link . Alert and Oriented, ambulatory at this time.

## 2021-03-10 NOTE — ED Notes (Signed)
Attempted to call report (3rd attempt) using [facility] main number [phone] with transfer to 5E nursing station; no answer.
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
## 2021-03-10 NOTE — ED Notes (Signed)
Provided report to [facility] RN Jasa by phone to facilitate transfer to [facility]. [facility] en route.

## 2021-03-10 NOTE — ED Notes (Signed)
Attempted report to [facility] (2nd attempt), no answer.

## 2021-03-10 NOTE — ED Notes (Signed)
Attempted to call report to [facility] at [phone], no answer.
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
## 2021-03-10 NOTE — ED Notes (Signed)
Attempted to call report (4th attempt) directly to nurse number [phone]; no answer and no option for voice mail.

## 2021-03-10 NOTE — Progress Notes (Signed)
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  CT abdomen and pelvis showed changes consistent with acute pancreatitis in pancreatic tail with small pseudocyst and splenic vein occlusion with collaterals.  GI consulted   Subjective: Seen and examined earlier this morning.  Continues to endorse significant pain over epigastric area.  She rates her pain 7-9 on a scale of 10.  She has not had emesis since admission but continues to have nausea and diarrhea.  She reports 4-5 loose bowel movements.  Stool is somewhat greenish but no melena or hematochezia.  Objective: Vitals:   03/10/21 0210 03/10/21 0217 03/10/21 0616 03/10/21 0956  BP: (!) 108/53  95/62 102/69  Pulse: 75  69 88  Resp: 18  18 20   Temp: 98 F (36.7 C)  98 F (36.7 C) 97.8 F (36.6 C)  TempSrc: Oral  Oral   SpO2: 100%  95% 92%  Weight: 84 kg     Height:  5\' 7"  (1.702 m)      Intake/Output Summary (Last 24 hours) at 03/10/2021 1305 Last data filed at 03/10/2021 0303 Gross per 24 hour  Intake 162.54 ml  Output --  Net 162.54 ml   Filed Weights   03/10/21 0210  Weight: 84 kg    Examination:  GENERAL: No apparent distress.  Nontoxic. HEENT: MMM.  Vision and hearing grossly intact.  NECK: Supple.  No apparent JVD.  RESP: On RA.  No IWOB.  Fair aeration bilaterally. CVS:  RRR. Heart sounds normal.  ABD/GI/GU: BS+. Abd soft.   Epigastric tenderness. MSK/EXT:  Moves extremities. No apparent deformity. No edema.  SKIN: no apparent skin lesion or wound NEURO: Awake, alert and oriented appropriately.  No apparent focal neuro deficit. PSYCH: Calm. Normal affect.   Procedures:  None  Microbiology summarized: COVID-19 and influenza PCR nonreactive.  Assessment & Plan: Acute on chronic pancreatitis in patient with history of necrotizing pancreatitis and pseudocyst previously treated with stents and AXIOS cystogastrostomy tube -CT abdomen and pelvis shows acute pancreatitis in the pancreatic tail with small fluid collection -Lipase, LFT and calcium within normal. -Patient endorses significant pain with nausea and diarrhea but no further emesis. -GI recommendation appreciated-pain control -Patient has history of severe opiate dependence which complicates pain management. -Continue clear liquid diet and Creon. -Antiemetics as needed for nausea and emesis -Continue IV fluid.  Diarrhea: Likely due to the above. -Check C. difficile, fecal fat and fecal WBC per GI recommendation -Continue Creon and colestipol  Nausea/emesis/abdominal pain-likely due to #1.  No further emesis here. -Pain control and antiemetics as above  Right cephalic vein DVT-[facility].  Has been on Eliquis. 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  I do not think she failed Eliquis. -Resume home Eliquis.  Opioid use disorder, severe, dependence: On Suboxone at home.  Last fill on 02/17/2021 for 30-day supply per narcotic database. -transition to home Suboxone when pain improves  Mood disorder: Stable. -Resume home medications  Body mass index is 29 kg/m.         DVT prophylaxis:   apixaban (ELIQUIS) tablet 5 mg  Code Status: Full code. Family Communication: Patient and/or RN. Available if any question.  Level of care: Med-Surg Status is:  Inpatient  Remains inpatient appropriate because:Ongoing active pain requiring inpatient pain management, IV treatments appropriate due to intensity of illness or inability to take PO, and Inpatient level of care appropriate due to severity of illness  Dispo: The patient is from: Home              Anticipated d/c is to: Home              Patient currently is not medically stable to d/c.   Difficult to place patient No       Consultants:  Gastroenterology   Sch Meds:  Scheduled Meds:  apixaban  5 mg Oral BID   busPIRone  15 mg Oral TID   colestipol  1 g Oral BID   DULoxetine  60 mg Oral Daily   fluticasone furoate-vilanterol  1 puff Inhalation Daily   lipase/protease/amylase  36,000 Units Oral TID WC   mirtazapine  15 mg Oral QHS   montelukast  10 mg Oral Daily   Multivitamin Adult   Oral Daily   pantoprazole  40 mg Oral BID AC   QUEtiapine  400 mg Oral QHS   Continuous Infusions:  lactated ringers 125 mL/hr at 03/10/21 0302   lactated ringers 125 mL/hr at 03/10/21 0303   promethazine (PHENERGAN) injection (IM or IVPB) 12.5 mg (03/10/21 1025)   PRN Meds:.HYDROmorphone (DILAUDID) injection, morphine injection, ondansetron (ZOFRAN) IV, ondansetron **OR** ondansetron (ZOFRAN) IV, promethazine (PHENERGAN) injection (IM or IVPB)  Antimicrobials: Anti-infectives (From admission, onward)    None        I have personally reviewed the following labs and images: CBC: Recent Labs  Lab 03/09/21 1821 03/10/21 0657  WBC 6.8 4.5  NEUTROABS 4.7  --   HGB 14.2 12.2  HCT 43.6 38.7  MCV 87.4 91.3  PLT 262 204   BMP &GFR Recent Labs  Lab 03/09/21 1821 03/10/21 0657  NA 136 138  K 3.5 3.5  CL 100 106  CO2 27 25  GLUCOSE 85 84  BUN 6 6  CREATININE 0.95 0.74  CALCIUM 10.1 9.2   Estimated Creatinine Clearance: 99 mL/min (by C-G formula based on SCr of 0.74 mg/dL). Liver & Pancreas: Recent Labs  Lab 03/09/21 1821 03/10/21 0657  AST 31 33  ALT 26 30  ALKPHOS  193* 166*  BILITOT 0.4 0.4  PROT 7.5 6.3*  ALBUMIN 4.2 3.4*   Recent Labs  Lab 03/09/21 1821  LIPASE 42   No results for input(s): AMMONIA in the last 168 hours. Diabetic: No results for input(s): HGBA1C in the last 72 hours. No results for input(s): GLUCAP in the last 168 hours. Cardiac Enzymes: No results for input(s): CKTOTAL, CKMB, CKMBINDEX, TROPONINI in the last 168 hours. No results for input(s): PROBNP in the last 8760 hours. Coagulation Profile: No results for input(s): INR, PROTIME in the last 168 hours. Thyroid Function Tests: No results for input(s): TSH, T4TOTAL, FREET4, T3FREE, THYROIDAB in the last 72 hours. Lipid Profile: No results for input(s): CHOL, HDL, LDLCALC, TRIG, CHOLHDL, LDLDIRECT in the last 72 hours. Anemia Panel: No results for input(s): VITAMINB12, FOLATE, FERRITIN, TIBC, IRON, RETICCTPCT in the last 72 hours. Urine analysis:    Component Value Date/Time   COLORURINE YELLOW 03/09/2021 1540  APPEARANCEUR CLEAR 03/09/2021 1540   APPEARANCEUR CLEAR 03/23/2013 1621   LABSPEC 1.016 03/09/2021 1540   LABSPEC 1.021 03/23/2013 1621   PHURINE 7.0 03/09/2021 1540   GLUCOSEU NEGATIVE 03/09/2021 1540   GLUCOSEU see comment 03/23/2013 1621   HGBUR TRACE (A) 03/09/2021 1540   BILIRUBINUR NEGATIVE 03/09/2021 1540   BILIRUBINUR see comment 03/23/2013 1621   KETONESUR NEGATIVE 03/09/2021 1540   PROTEINUR 30 (A) 03/09/2021 1540   UROBILINOGEN 1.0 09/29/2014 2116   NITRITE NEGATIVE 03/09/2021 1540   LEUKOCYTESUR NEGATIVE 03/09/2021 1540   LEUKOCYTESUR see comment 03/23/2013 1621   Sepsis Labs: Invalid input(s): PROCALCITONIN, LACTICIDVEN  Microbiology: Recent Results (from the past 240 hour(s))  Resp Panel by RT-PCR (Flu A&B, Covid) Nasopharyngeal Swab     Status: None   Collection Time: 03/09/21 11:43 PM   Specimen: Nasopharyngeal Swab; Nasopharyngeal(NP) swabs in vial transport medium  Result Value Ref Range Status   SARS Coronavirus 2 by RT PCR  NEGATIVE NEGATIVE Final    Comment: (NOTE) SARS-CoV-2 target nucleic acids are NOT DETECTED.  The SARS-CoV-2 RNA is generally detectable in upper respiratory specimens during the acute phase of infection. The lowest concentration of SARS-CoV-2 viral copies this assay can detect is 138 copies/mL. A negative result does not preclude SARS-Cov-2 infection and should not be used as the sole basis for treatment or other patient management decisions. A negative result may occur with  improper specimen collection/handling, submission of specimen other than nasopharyngeal swab, presence of viral mutation(s) within the areas targeted by this assay, and inadequate number of viral copies(<138 copies/mL). A negative result must be combined with clinical observations, patient history, and epidemiological information. The expected result is Negative.  Fact Sheet for Patients:  BloggerCourse.com  Fact Sheet for Healthcare Providers:  SeriousBroker.it  This test is no t yet approved or cleared by the [place] FDA and  has been authorized for detection and/or diagnosis of SARS-CoV-2 by FDA under an Emergency Use Authorization (EUA). This EUA will remain  in effect (meaning this test can be used) for the duration of the COVID-19 declaration under Section 564(b)(1) of the Act, 21 U.S.C.section 360bbb-3(b)(1), unless the authorization is terminated  or revoked sooner.       Influenza A by PCR NEGATIVE NEGATIVE Final   Influenza B by PCR NEGATIVE NEGATIVE Final    Comment: (NOTE) The Xpert Xpress SARS-CoV-2/FLU/RSV plus assay is intended as an aid in the diagnosis of influenza from Nasopharyngeal swab specimens and should not be used as a sole basis for treatment. Nasal washings and aspirates are unacceptable for Xpert Xpress SARS-CoV-2/FLU/RSV testing.  Fact Sheet for Patients: BloggerCourse.com  Fact Sheet for  Healthcare Providers: SeriousBroker.it  This test is not yet approved or cleared by the [place] FDA and has been authorized for detection and/or diagnosis of SARS-CoV-2 by FDA under an Emergency Use Authorization (EUA). This EUA will remain in effect (meaning this test can be used) for the duration of the COVID-19 declaration under Section 564(b)(1) of the Act, 21 U.S.C. section 360bbb-3(b)(1), unless the authorization is terminated or revoked.  Performed at [facility], [address], [place], [place] [address]     Radiology Studies: CT ABDOMEN PELVIS W CONTRAST  Result Date: 03/09/2021 CLINICAL DATA:  Right lower quadrant abdominal pain. Appendicitis suspected. Patient reports urinary frequency and dysuria. Patient reports daily pain due to chronic pancreatitis. EXAM: CT ABDOMEN AND PELVIS WITH CONTRAST TECHNIQUE: Multidetector CT imaging of the abdomen and pelvis was performed using the standard  protocol following bolus administration of intravenous contrast. CONTRAST:  21mL OMNIPAQUE IOHEXOL 300 MG/ML  SOLN COMPARISON:  Abdominal CT 09/26/2020.  Abdominal MRI 09/28/2020 FINDINGS: Lower chest: Hypoventilatory atelectasis without confluent airspace disease or pleural effusion. Heart is normal in size. Hepatobiliary: No focal liver abnormality is seen. Status post cholecystectomy. No biliary dilatation. Pancreas: Mild fat stranding and edema about the distal pancreatic body and tail, for example series 2, image 18. Small fluid collection about the pancreatic tail abuts the stomach and measures approximately 1.8 x 1.5 x 1.6 cm, series 2, image 18. This may represent contraction of the previous lesser sac fluid collection which is no longer seen. No other pancreatic collection. Mild chronic atrophy of the pancreatic head and body. No pancreatic necrosis. No ductal dilatation or pancreatic mass. Spleen: Normal in size without focal abnormality.  Splenule at the hilum. Adrenals/Urinary Tract: No adrenal nodule. No hydronephrosis or perinephric edema. Homogeneous renal enhancement with symmetric excretion on delayed phase imaging. No focal lesion or renal calculi. Urinary bladder is physiologically distended without wall thickening. Stomach/Bowel: Decompressed stomach. There is no small bowel obstruction, administered enteric contrast reaches the colon. No small bowel inflammation. Terminal ileum is normal without inflammation. Normal appendix, series 2, image 63. Occasional fluid levels throughout the colon. No colonic wall thickening or pericolonic edema. Vascular/Lymphatic: Normal caliber abdominal aorta. Patent portal vein. The splenic vein is attenuated with left upper quadrant collaterals suggesting splenic vein occlusion, new from prior exam but likely developed in the interim. No abdominopelvic adenopathy. Reproductive: Hysterectomy.  No adnexal mass. Other: No ascites. No free air. Small fat containing umbilical hernia. Musculoskeletal: There are no acute or suspicious osseous abnormalities. IMPRESSION: 1. Normal appendix. 2. Mild fat stranding and edema about the distal pancreatic body and tail, consistent with acute pancreatitis. Small fluid collection about the pancreatic tail abuts the stomach and measures 1.8 x 1.5 x 1.6 cm. This may represent contraction of the previous lesser sac fluid collection. 3. Occasional fluid levels throughout the colon, can be seen with diarrheal illness. No colonic wall thickening or pericolonic edema. 4. 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 Electronically Signed   By: Narda Rutherford M.D.   On: 03/09/2021 20:42     Additional 35 minutes with more than 50% spent in reviewing records, counseling patient/family and coordinating care.  Savva Beamer T. Gladis Soley [facility]  If 7PM-7AM, please contact  night-coverage [email or web address] 03/10/2021, 1:05 PM

## 2021-03-11 LAB — LACTOFERRIN, FECAL, QUALITATIVE: Lactoferrin, Fecal, Qual: NEGATIVE

## 2021-03-11 MED ORDER — SUCRALFATE 1 G PO TABS
1.0000 g | ORAL_TABLET | Freq: Two times a day (BID) | ORAL | Status: DC
  Administered 2021-03-11 – 2021-03-15 (×9): 1 g via ORAL
  Filled 2021-03-11 (×9): qty 1

## 2021-03-11 NOTE — Progress Notes (Signed)
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  CT abdomen and pelvis showed changes consistent with acute pancreatitis in pancreatic tail with small pseudocyst and splenic vein occlusion with collaterals.  GI consulted  Assessment & Plan:   Principal Problem:   Acute on chronic pancreatitis (HCC) Active Problems:   Opioid use disorder, severe, dependence (HCC)   Abdominal pain   Acute on chronic pancreatitis in patient with history of necrotizing pancreatitis and pseudocyst previously treated with stents and AXIOS cystogastrostomy tube -CT abdomen and pelvis shows acute pancreatitis in the pancreatic tail with small fluid collection -Lipase, LFT and calcium within normal. -Patient endorses significant pain with nausea and diarrhea but no further emesis. -GI recommendation appreciated-pain control -Patient has history of severe opiate dependence which complicates pain management. -Still complains of significant pain with only a slight improvement compared to yesterday.  Still has nausea and vomiting.  Continue clear liquid diet and Creon. -Antiemetics as needed for nausea and emesis -Continue IV fluid.   Diarrhea: Likely due to the above.  C. difficile lactoferrin negative.  Fecal fat pending. -Continue Creon and colestipol   Nausea/emesis/abdominal pain-likely due to #1.  Still with nausea and some small amount of vomiting this morning.   Right cephalic vein DVT-[facility].  Has been on Eliquis. 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  I do not think she failed Eliquis.  Continue Eliquis.   Opioid use disorder, severe, dependence: On Suboxone at home.  Last fill on 02/17/2021 for 30-day supply per narcotic database. -transition to home Suboxone when pain improves   Mood disorder: Stable. -Continue home medications  DVT prophylaxis:    Code Status: Full Code  Family Communication: None present at bedside.  Plan of care discussed with patient in length and he verbalized understanding and agreed with it.  Status is: Inpatient  Remains inpatient appropriate because:IV treatments appropriate due to intensity of illness or inability to take PO  Dispo: The patient is from: Home              Anticipated d/c is to: Home              Patient currently is not medically stable to d/c.   Difficult to place patient No        Estimated body mass index is 29 kg/m as calculated from the following:   Height as of this encounter: 5\' 7"  (1.702 m).   Weight as of this encounter: 84 kg.      Nutritional status:               Consultants:  GI  Procedures:  Plan  Antimicrobials:  Anti-infectives (From admission, onward)    None          Subjective: Seen and examined.  Pain improved slightly but still significant.  Still with nausea and some vomiting.  No other complaint.  Objective: Vitals:   03/10/21 1400 03/10/21 1830 03/10/21 2046 03/11/21 0826  BP: 106/68 109/70 109/64   Pulse: 67  76  Resp:   18   Temp: 97.9 F (36.6 C)  98.1 F (36.7 C)   TempSrc: Oral     SpO2: 97%  93% 94%  Weight:      Height:        Intake/Output Summary (Last 24 hours) at 03/11/2021 1334 Last data filed at 03/11/2021 0830 Gross per 24 hour  Intake 1941.34 ml  Output --  Net 1941.34 ml   Filed Weights   03/10/21 0210  Weight: 84 kg     Examination:  General exam: Appears calm and comfortable  Respiratory system: Clear to auscultation. Respiratory effort normal. Cardiovascular system: S1 & S2 heard, RRR. No JVD, murmurs, rubs, gallops or clicks. No pedal edema. Gastrointestinal system: Abdomen is nondistended, soft and severe generalized abdominal tenderness but more pronounced in the epigastric area. No organomegaly or masses felt. Normal bowel sounds heard. Central nervous system: Alert and oriented. No focal neurological deficits. Extremities: Symmetric 5 x 5 power. Skin: No rashes, lesions or ulcers Psychiatry: Judgement and insight appear normal. Mood & affect appropriate.    Data Reviewed: I have personally reviewed following labs and imaging studies  CBC: Recent Labs  Lab 03/09/21 1821 03/10/21 0657  WBC 6.8 4.5  NEUTROABS 4.7  --   HGB 14.2 12.2  HCT 43.6 38.7  MCV 87.4 91.3  PLT 262 204   Basic Metabolic Panel: Recent Labs  Lab 03/09/21 1821 03/10/21 0657  NA 136 138  K 3.5 3.5  CL 100 106  CO2 27 25  GLUCOSE 85 84  BUN 6 6  CREATININE 0.95 0.74  CALCIUM 10.1 9.2   GFR: Estimated Creatinine Clearance: 99 mL/min (by C-G formula based on SCr of 0.74 mg/dL). Liver Function Tests: Recent Labs  Lab 03/09/21 1821 03/10/21 0657  AST 31 33  ALT 26 30  ALKPHOS 193* 166*  BILITOT 0.4 0.4  PROT 7.5 6.3*  ALBUMIN 4.2 3.4*   Recent Labs  Lab 03/09/21 1821  LIPASE 42   No results for input(s): AMMONIA in the last 168 hours. Coagulation Profile: No results for input(s): INR, PROTIME in the last 168 hours. Cardiac Enzymes: No results for input(s): CKTOTAL, CKMB, CKMBINDEX, TROPONINI in the last 168 hours. BNP (last 3 results) No results for input(s): PROBNP in the last 8760 hours. HbA1C: No results for input(s): HGBA1C in the last 72 hours. CBG: No results for input(s): GLUCAP in the last 168 hours. Lipid Profile: No results for input(s): CHOL, HDL, LDLCALC, TRIG, CHOLHDL,  LDLDIRECT in the last 72 hours. Thyroid Function Tests: No results for input(s): TSH, T4TOTAL, FREET4, T3FREE, THYROIDAB in the last 72 hours. Anemia Panel: No results for input(s): VITAMINB12, FOLATE, FERRITIN, TIBC, IRON, RETICCTPCT in the last 72 hours. Sepsis Labs: No results for input(s): PROCALCITON, LATICACIDVEN in the last 168 hours.  Recent Results (from the past 240 hour(s))  Resp Panel by RT-PCR (Flu A&B, Covid) Nasopharyngeal Swab     Status: None   Collection Time: 03/09/21 11:43 PM   Specimen: Nasopharyngeal Swab; Nasopharyngeal(NP) swabs in vial transport medium  Result Value Ref Range Status   SARS Coronavirus 2 by RT PCR NEGATIVE NEGATIVE Final    Comment: (NOTE) SARS-CoV-2 target nucleic acids are NOT DETECTED.  The SARS-CoV-2 RNA is generally detectable in upper respiratory specimens during the acute phase of infection. The lowest concentration of SARS-CoV-2 viral copies this assay can detect is 138 copies/mL. A negative result does not preclude SARS-Cov-2 infection and should not be used as the sole basis  for treatment or other patient management decisions. A negative result may occur with  improper specimen collection/handling, submission of specimen other than nasopharyngeal swab, presence of viral mutation(s) within the areas targeted by this assay, and inadequate number of viral copies(<138 copies/mL). A negative result must be combined with clinical observations, patient history, and epidemiological information. The expected result is Negative.  Fact Sheet for Patients:  BloggerCourse.com  Fact Sheet for Healthcare Providers:  SeriousBroker.it  This test is no t yet approved or cleared by the [place] FDA and  has been authorized for detection and/or diagnosis of SARS-CoV-2 by FDA under an Emergency Use Authorization (EUA). This EUA will remain  in effect (meaning this test can be used) for the  duration of the COVID-19 declaration under Section 564(b)(1) of the Act, 21 U.S.C.section 360bbb-3(b)(1), unless the authorization is terminated  or revoked sooner.       Influenza A by PCR NEGATIVE NEGATIVE Final   Influenza B by PCR NEGATIVE NEGATIVE Final    Comment: (NOTE) The Xpert Xpress SARS-CoV-2/FLU/RSV plus assay is intended as an aid in the diagnosis of influenza from Nasopharyngeal swab specimens and should not be used as a sole basis for treatment. Nasal washings and aspirates are unacceptable for Xpert Xpress SARS-CoV-2/FLU/RSV testing.  Fact Sheet for Patients: BloggerCourse.com  Fact Sheet for Healthcare Providers: SeriousBroker.it  This test is not yet approved or cleared by the [place] FDA and has been authorized for detection and/or diagnosis of SARS-CoV-2 by FDA under an Emergency Use Authorization (EUA). This EUA will remain in effect (meaning this test can be used) for the duration of the COVID-19 declaration under Section 564(b)(1) of the Act, 21 U.S.C. section 360bbb-3(b)(1), unless the authorization is terminated or revoked.  Performed at [facility], [address], [place], [place] [address]   C Difficile Quick Screen w PCR reflex     Status: None   Collection Time: 03/10/21  2:01 PM   Specimen: STOOL  Result Value Ref Range Status   C Diff antigen NEGATIVE NEGATIVE Final   C Diff toxin NEGATIVE NEGATIVE Final   C Diff interpretation No C. difficile detected.  Final    Comment: Performed at [facility], [address] [place], [place] [address]      Radiology Studies: CT ABDOMEN PELVIS W CONTRAST  Result Date: 03/09/2021 CLINICAL DATA:  Right lower quadrant abdominal pain. Appendicitis suspected. Patient reports urinary frequency and dysuria. Patient reports daily pain due to chronic pancreatitis. EXAM: CT ABDOMEN AND PELVIS WITH CONTRAST  TECHNIQUE: Multidetector CT imaging of the abdomen and pelvis was performed using the standard protocol following bolus administration of intravenous contrast. CONTRAST:  30mL OMNIPAQUE IOHEXOL 300 MG/ML  SOLN COMPARISON:  Abdominal CT 09/26/2020.  Abdominal MRI 09/28/2020 FINDINGS: Lower chest: Hypoventilatory atelectasis without confluent airspace disease or pleural effusion. Heart is normal in size. Hepatobiliary: No focal liver abnormality is seen. Status post cholecystectomy. No biliary dilatation. Pancreas: Mild fat stranding and edema about the distal pancreatic body and tail, for example series 2, image 18. Small fluid collection about the pancreatic tail abuts the stomach and measures approximately 1.8 x 1.5 x 1.6 cm, series 2, image 18. This may represent contraction of the previous lesser sac fluid collection which is no longer seen. No other pancreatic collection. Mild chronic atrophy of the pancreatic head and body. No pancreatic necrosis. No ductal dilatation or pancreatic mass. Spleen: Normal in size without focal abnormality. Splenule at the hilum. Adrenals/Urinary Tract: No  adrenal nodule. No hydronephrosis or perinephric edema. Homogeneous renal enhancement with symmetric excretion on delayed phase imaging. No focal lesion or renal calculi. Urinary bladder is physiologically distended without wall thickening. Stomach/Bowel: Decompressed stomach. There is no small bowel obstruction, administered enteric contrast reaches the colon. No small bowel inflammation. Terminal ileum is normal without inflammation. Normal appendix, series 2, image 63. Occasional fluid levels throughout the colon. No colonic wall thickening or pericolonic edema. Vascular/Lymphatic: Normal caliber abdominal aorta. Patent portal vein. The splenic vein is attenuated with left upper quadrant collaterals suggesting splenic vein occlusion, new from prior exam but likely developed in the interim. No abdominopelvic adenopathy.  Reproductive: Hysterectomy.  No adnexal mass. Other: No ascites. No free air. Small fat containing umbilical hernia. Musculoskeletal: There are no acute or suspicious osseous abnormalities. IMPRESSION: 1. Normal appendix. 2. Mild fat stranding and edema about the distal pancreatic body and tail, consistent with acute pancreatitis. Small fluid collection about the pancreatic tail abuts the stomach and measures 1.8 x 1.5 x 1.6 cm. This may represent contraction of the previous lesser sac fluid collection. 3. Occasional fluid levels throughout the colon, can be seen with diarrheal illness. No colonic wall thickening or pericolonic edema. 4. 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 Electronically Signed   By: Narda Rutherford M.D.   On: 03/09/2021 20:42    Scheduled Meds:  apixaban  5 mg Oral BID   busPIRone  15 mg Oral TID   colestipol  1 g Oral BID   DULoxetine  60 mg Oral Daily   fluticasone furoate-vilanterol  1 puff Inhalation Daily   lipase/protease/amylase  36,000 Units Oral TID WC   mirtazapine  15 mg Oral QHS   montelukast  10 mg Oral Daily   multivitamin with minerals  1 tablet Oral Daily   pantoprazole  40 mg Oral BID AC   QUEtiapine  400 mg Oral QHS   Continuous Infusions:  lactated ringers 125 mL/hr at 03/11/21 0802   promethazine (PHENERGAN) injection (IM or IVPB) 12.5 mg (03/11/21 0902)     LOS: 1 day   Time spent: 35 min   Hughie Closs, MD [facility]  03/11/2021, 1:34 PM   How to contact the [facility] Attending or Consulting provider 7A - 7P or covering provider during after hours 7P -7A, for this patient?  Check the care team in [facility] and look for a) attending/consulting [facility] provider listed and b) the [facility] team listed. Page or secure chat 7A-7P. Log into [email or web address] and use [place] [facility]'s universal password to access. If you do not have the password, please  contact the hospital operator. Locate the [facility] provider you are looking for under [facility] and page to a number that you can be directly reached. If you still have difficulty reaching the provider, please page the [facility] (Director on Call) for the Hospitalists listed on amion for assistance.

## 2021-03-12 LAB — CBC WITH DIFFERENTIAL/PLATELET
Abs Immature Granulocytes: 0 10*3/uL (ref 0.00–0.07)
Basophils Absolute: 0 10*3/uL (ref 0.0–0.1)
Basophils Relative: 1 %
Eosinophils Absolute: 0.4 10*3/uL (ref 0.0–0.5)
Eosinophils Relative: 9 %
HCT: 36.1 % (ref 36.0–46.0)
Hemoglobin: 11.8 g/dL — ABNORMAL LOW (ref 12.0–15.0)
Immature Granulocytes: 0 %
Lymphocytes Relative: 35 %
Lymphs Abs: 1.4 10*3/uL (ref 0.7–4.0)
MCH: 29.1 pg (ref 26.0–34.0)
MCHC: 32.7 g/dL (ref 30.0–36.0)
MCV: 88.9 fL (ref 80.0–100.0)
Monocytes Absolute: 0.4 10*3/uL (ref 0.1–1.0)
Monocytes Relative: 9 %
Neutro Abs: 1.8 10*3/uL (ref 1.7–7.7)
Neutrophils Relative %: 46 %
Platelets: 194 10*3/uL (ref 150–400)
RBC: 4.06 MIL/uL (ref 3.87–5.11)
RDW: 12.5 % (ref 11.5–15.5)
WBC: 3.9 10*3/uL — ABNORMAL LOW (ref 4.0–10.5)
nRBC: 0 % (ref 0.0–0.2)

## 2021-03-12 LAB — COMPREHENSIVE METABOLIC PANEL
ALT: 62 U/L — ABNORMAL HIGH (ref 0–44)
AST: 58 U/L — ABNORMAL HIGH (ref 15–41)
Albumin: 3 g/dL — ABNORMAL LOW (ref 3.5–5.0)
Alkaline Phosphatase: 159 U/L — ABNORMAL HIGH (ref 38–126)
Anion gap: 6 (ref 5–15)
BUN: <5 mg/dL — ABNORMAL LOW (ref 6–20)
CO2: 29 mmol/L (ref 22–32)
Calcium: 8.8 mg/dL — ABNORMAL LOW (ref 8.9–10.3)
Chloride: 107 mmol/L (ref 98–111)
Creatinine, Ser: 0.88 mg/dL (ref 0.44–1.00)
GFR, Estimated: >60 mL/min (ref >60)
Glucose, Bld: 89 mg/dL (ref 70–99)
Potassium: 3.4 mmol/L — ABNORMAL LOW (ref 3.5–5.1)
Sodium: 142 mmol/L (ref 135–145)
Total Bilirubin: 0.3 mg/dL (ref 0.3–1.2)
Total Protein: 5.9 g/dL — ABNORMAL LOW (ref 6.5–8.1)

## 2021-03-12 MED ORDER — POTASSIUM CHLORIDE CRYS ER 10 MEQ PO TBCR
40.0000 meq | EXTENDED_RELEASE_TABLET | Freq: Once | ORAL | Status: AC
  Administered 2021-03-12: 40 meq via ORAL
  Filled 2021-03-12: qty 4

## 2021-03-12 NOTE — Progress Notes (Signed)
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  CT abdomen and pelvis showed changes consistent with acute pancreatitis in pancreatic tail with small pseudocyst and splenic vein occlusion with collaterals.  GI consulted  Assessment & Plan:   Principal Problem:   Acute on chronic pancreatitis (HCC) Active Problems:   Opioid use disorder, severe, dependence (HCC)   Abdominal pain   Acute on chronic pancreatitis in patient with history of necrotizing pancreatitis and pseudocyst previously treated with stents and AXIOS cystogastrostomy tube -CT abdomen and pelvis shows acute pancreatitis in the pancreatic tail with small fluid collection -Lipase, LFT and calcium within normal. -Patient endorses significant pain with nausea and diarrhea but no further emesis. -GI recommendation appreciated-pain control -Patient has history of severe opiate dependence which complicates pain management.  Still with pain but improving.  Tolerating liquid diet.  She is willing to advance and see how she does with liquid diet.  We will do that.  Continue current management.  We will stop IV fluids.   Diarrhea: Likely due to the above.  C. difficile lactoferrin negative.  Fecal fat pending. -Continue Creon and colestipol   Nausea/emesis/abdominal pain-likely due to #1.  Still with nausea and some small amount of vomiting this morning.   Right cephalic vein DVT-recent diagnosis in 11/2020 at [facility].  Has been on  Eliquis. 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  I do not think she failed Eliquis.  Continue Eliquis.   Opioid use disorder, severe, dependence: On Suboxone at home.  Last fill on 02/17/2021 for 30-day supply per narcotic database. -transition to home Suboxone when pain improves   Mood disorder: Stable. -Continue home medications  DVT prophylaxis:    Code Status: Full Code  Family Communication: None present at bedside.  Plan of care discussed with patient in length and he verbalized understanding and agreed with it.  Status is: Inpatient  Remains inpatient appropriate because:IV treatments appropriate due to intensity of illness or inability to take PO  Dispo: The patient is from: Home              Anticipated d/c is to: Home              Patient currently is not medically stable to d/c.   Difficult to place patient No        Estimated body mass index is 29 kg/m as calculated from the following:   Height as of this encounter: 5\' 7"  (1.702 m).   Weight as of this encounter: 84 kg.      Nutritional status:               Consultants:  GI  Procedures:  None  Antimicrobials:  Anti-infectives (From admission, onward)    None          Subjective: Patient seen and examined.  Still has pain but better, 7 out of 10 today.  No new complaint.  Some nausea.  Objective: Vitals:   03/11/21 2054 03/12/21 0605 03/12/21 0819 03/12/21 1352  BP: 110/68 114/74  113/62  Pulse: 70 66  84  Resp: 19 18    Temp: 98.5 F (36.9 C) 98 F (36.7 C)    TempSrc: Oral Oral    SpO2:  97% 97% 98%  Weight:      Height:       No intake or output data in the 24 hours ending 03/12/21 1424  Filed Weights   03/10/21 0210  Weight: 84 kg    Examination:  General exam: Appears calm and comfortable  Respiratory system: Clear to auscultation. Respiratory effort normal. Cardiovascular system: S1 & S2 heard,  RRR. No JVD, murmurs, rubs, gallops or clicks. No pedal edema. Gastrointestinal system: Abdomen is nondistended, soft and mild epigastric tenderness. No organomegaly or masses felt. Normal bowel sounds heard. Central nervous system: Alert and oriented. No focal neurological deficits. Extremities: Symmetric 5 x 5 power. Skin: No rashes, lesions or ulcers.  Psychiatry: Judgement and insight appear normal. Mood & affect appropriate.    Data Reviewed: I have personally reviewed following labs and imaging studies  CBC: Recent Labs  Lab 03/09/21 1821 03/10/21 0657 03/12/21 0506  WBC 6.8 4.5 3.9*  NEUTROABS 4.7  --  1.8  HGB 14.2 12.2 11.8*  HCT 43.6 38.7 36.1  MCV 87.4 91.3 88.9  PLT 262 204 194    Basic Metabolic Panel: Recent Labs  Lab 03/09/21 1821 03/10/21 0657 03/12/21 0506  NA 136 138 142  K 3.5 3.5 3.4*  CL 100 106 107  CO2 27 25 29   GLUCOSE 85 84 89  BUN 6 6 <5*  CREATININE 0.95 0.74 0.88  CALCIUM 10.1 9.2 8.8*    GFR: Estimated Creatinine Clearance: 90 mL/min (by C-G formula based on SCr of 0.88 mg/dL). Liver Function Tests: Recent Labs  Lab 03/09/21 1821 03/10/21 0657 03/12/21 0506  AST 31 33 58*  ALT 26 30 62*  ALKPHOS 193* 166* 159*  BILITOT 0.4 0.4 0.3  PROT 7.5 6.3* 5.9*  ALBUMIN 4.2 3.4* 3.0*    Recent Labs  Lab 03/09/21 1821  LIPASE 42    No results for input(s): AMMONIA in the last 168 hours. Coagulation Profile: No results for input(s): INR, PROTIME in the last 168 hours. Cardiac Enzymes: No results for input(s): CKTOTAL, CKMB, CKMBINDEX, TROPONINI in the last 168 hours. BNP (last 3 results) No results for input(s): PROBNP in the last 8760 hours. HbA1C: No results for input(s): HGBA1C in the last 72 hours. CBG: No results for input(s): GLUCAP in the last 168 hours. Lipid Profile: No results for input(s): CHOL, HDL, LDLCALC, TRIG, CHOLHDL, LDLDIRECT in the last 72 hours. Thyroid Function Tests: No results for input(s): TSH,  T4TOTAL, FREET4, T3FREE, THYROIDAB in the last 72 hours. Anemia Panel: No results for input(s): VITAMINB12, FOLATE, FERRITIN, TIBC, IRON, RETICCTPCT in the last 72 hours. Sepsis Labs: No results for input(s): PROCALCITON, LATICACIDVEN in the last 168 hours.  Recent Results (from the past 240 hour(s))  Resp Panel by RT-PCR (Flu A&B, Covid) Nasopharyngeal Swab     Status: None   Collection Time: 03/09/21 11:43 PM   Specimen: Nasopharyngeal Swab; Nasopharyngeal(NP) swabs in vial transport medium  Result Value Ref Range Status   SARS Coronavirus 2 by RT PCR NEGATIVE NEGATIVE Final    Comment: (NOTE) SARS-CoV-2 target nucleic acids are NOT DETECTED.  The SARS-CoV-2 RNA is generally detectable in upper respiratory specimens during the acute phase of infection. The lowest concentration of SARS-CoV-2 viral copies this assay can detect is 138 copies/mL. A negative result does not preclude SARS-Cov-2  infection and should not be used as the sole basis for treatment or other patient management decisions. A negative result may occur with  improper specimen collection/handling, submission of specimen other than nasopharyngeal swab, presence of viral mutation(s) within the areas targeted by this assay, and inadequate number of viral copies(<138 copies/mL). A negative result must be combined with clinical observations, patient history, and epidemiological information. The expected result is Negative.  Fact Sheet for Patients:  BloggerCourse.com  Fact Sheet for Healthcare Providers:  SeriousBroker.it  This test is no t yet approved or cleared by the [place] FDA and  has been authorized for detection and/or diagnosis of SARS-CoV-2 by FDA under an Emergency Use Authorization (EUA). This EUA will remain  in effect (meaning this test can be used) for the duration of the COVID-19 declaration under Section 564(b)(1) of the Act, 21 U.S.C.section  360bbb-3(b)(1), unless the authorization is terminated  or revoked sooner.       Influenza A by PCR NEGATIVE NEGATIVE Final   Influenza B by PCR NEGATIVE NEGATIVE Final    Comment: (NOTE) The Xpert Xpress SARS-CoV-2/FLU/RSV plus assay is intended as an aid in the diagnosis of influenza from Nasopharyngeal swab specimens and should not be used as a sole basis for treatment. Nasal washings and aspirates are unacceptable for Xpert Xpress SARS-CoV-2/FLU/RSV testing.  Fact Sheet for Patients: BloggerCourse.com  Fact Sheet for Healthcare Providers: SeriousBroker.it  This test is not yet approved or cleared by the [place] FDA and has been authorized for detection and/or diagnosis of SARS-CoV-2 by FDA under an Emergency Use Authorization (EUA). This EUA will remain in effect (meaning this test can be used) for the duration of the COVID-19 declaration under Section 564(b)(1) of the Act, 21 U.S.C. section 360bbb-3(b)(1), unless the authorization is terminated or revoked.  Performed at [facility], [address], [place], [place] [address]   C Difficile Quick Screen w PCR reflex     Status: None   Collection Time: 03/10/21  2:01 PM   Specimen: STOOL  Result Value Ref Range Status   C Diff antigen NEGATIVE NEGATIVE Final   C Diff toxin NEGATIVE NEGATIVE Final   C Diff interpretation No C. difficile detected.  Final    Comment: Performed at [facility], [address] [place], [place] [address]       Radiology Studies: No results found.  Scheduled Meds:  apixaban  5 mg Oral BID   busPIRone  15 mg Oral TID   colestipol  1 g Oral BID   DULoxetine  60 mg Oral Daily   fluticasone furoate-vilanterol  1 puff Inhalation Daily   lipase/protease/amylase  36,000 Units Oral TID WC   mirtazapine  15 mg Oral QHS   montelukast  10 mg Oral Daily   multivitamin with minerals  1 tablet Oral  Daily   pantoprazole  40 mg Oral BID AC   QUEtiapine  400 mg Oral QHS   sucralfate  1 g Oral BID   Continuous Infusions:  lactated ringers 125 mL/hr at 03/12/21 1016   promethazine (PHENERGAN) injection (IM or IVPB) 12.5 mg (03/11/21 1830)     LOS: 2 days   Time spent: 30 min   Hughie Closs, MD [facility]  03/12/2021, 2:24 PM   How to contact the [facility] Attending or Consulting provider 7A - 7P or covering provider during after hours 7P -7A, for this patient?  Check the care team in [facility] and look for a) attending/consulting TRH  provider listed and b) the [facility] team listed. Page or secure chat 7A-7P. Log into [email or web address] and use [place]'s universal password to access. If you do not have the password, please contact the hospital operator. Locate the [facility] provider you are looking for under [facility] and page to a number that you can be directly reached. If you still have difficulty reaching the provider, please page the [facility] (Director on Call) for the Hospitalists listed on amion for assistance.

## 2021-03-12 NOTE — Plan of Care (Signed)
?  Problem: Education: ?Goal: Knowledge of Pancreatitis treatment and prevention will improve ?Outcome: Progressing ?  ?Problem: Health Behavior/Discharge Planning: ?Goal: Ability to formulate a plan to maintain an alcohol-free life will improve ?Outcome: Progressing ?  ?Problem: Nutritional: ?Goal: Ability to achieve adequate nutritional intake will improve ?Outcome: Progressing ?  ?Problem: Clinical Measurements: ?Goal: Complications related to the disease process, condition or treatment will be avoided or minimized ?Outcome: Progressing ?  ?Problem: Education: ?Goal: Knowledge of General Education information will improve ?Description: Including pain rating scale, medication(s)/side effects and non-pharmacologic comfort measures ?Outcome: Progressing ?  ?Problem: Health Behavior/Discharge Planning: ?Goal: Ability to manage health-related needs will improve ?Outcome: Progressing ?  ?Problem: Clinical Measurements: ?Goal: Ability to maintain clinical measurements within normal limits will improve ?Outcome: Progressing ?Goal: Will remain free from infection ?Outcome: Progressing ?Goal: Diagnostic test results will improve ?Outcome: Progressing ?Goal: Respiratory complications will improve ?Outcome: Progressing ?Goal: Cardiovascular complication will be avoided ?Outcome: Progressing ?  ?Problem: Activity: ?Goal: Risk for activity intolerance will decrease ?Outcome: Progressing ?  ?Problem: Nutrition: ?Goal: Adequate nutrition will be maintained ?Outcome: Progressing ?  ?Problem: Coping: ?Goal: Level of anxiety will decrease ?Outcome: Progressing ?  ?Problem: Elimination: ?Goal: Will not experience complications related to bowel motility ?Outcome: Progressing ?Goal: Will not experience complications related to urinary retention ?Outcome: Progressing ?  ?Problem: Pain Managment: ?Goal: General experience of comfort will improve ?Outcome: Progressing ?  ?Problem: Safety: ?Goal: Ability to remain free from injury will  improve ?Outcome: Progressing ?  ?Problem: Skin Integrity: ?Goal: Risk for impaired skin integrity will decrease ?Outcome: Progressing ?  ?

## 2021-03-13 LAB — CBC WITH DIFFERENTIAL/PLATELET
Abs Immature Granulocytes: 0.01 10*3/uL (ref 0.00–0.07)
Basophils Absolute: 0 10*3/uL (ref 0.0–0.1)
Basophils Relative: 1 %
Eosinophils Absolute: 0.4 10*3/uL (ref 0.0–0.5)
Eosinophils Relative: 8 %
HCT: 39.9 % (ref 36.0–46.0)
Hemoglobin: 13.2 g/dL (ref 12.0–15.0)
Immature Granulocytes: 0 %
Lymphocytes Relative: 27 %
Lymphs Abs: 1.4 10*3/uL (ref 0.7–4.0)
MCH: 28.8 pg (ref 26.0–34.0)
MCHC: 33.1 g/dL (ref 30.0–36.0)
MCV: 86.9 fL (ref 80.0–100.0)
Monocytes Absolute: 0.5 10*3/uL (ref 0.1–1.0)
Monocytes Relative: 9 %
Neutro Abs: 2.7 10*3/uL (ref 1.7–7.7)
Neutrophils Relative %: 55 %
Platelets: 129 10*3/uL — ABNORMAL LOW (ref 150–400)
RBC: 4.59 MIL/uL (ref 3.87–5.11)
RDW: 12.6 % (ref 11.5–15.5)
WBC: 5 10*3/uL (ref 4.0–10.5)
nRBC: 0 % (ref 0.0–0.2)

## 2021-03-13 LAB — COMPREHENSIVE METABOLIC PANEL
ALT: 107 U/L — ABNORMAL HIGH (ref 0–44)
AST: 93 U/L — ABNORMAL HIGH (ref 15–41)
Albumin: 3.2 g/dL — ABNORMAL LOW (ref 3.5–5.0)
Alkaline Phosphatase: 182 U/L — ABNORMAL HIGH (ref 38–126)
Anion gap: 8 (ref 5–15)
BUN: <5 mg/dL — ABNORMAL LOW (ref 6–20)
CO2: 26 mmol/L (ref 22–32)
Calcium: 9 mg/dL (ref 8.9–10.3)
Chloride: 105 mmol/L (ref 98–111)
Creatinine, Ser: 0.93 mg/dL (ref 0.44–1.00)
GFR, Estimated: >60 mL/min (ref >60)
Glucose, Bld: 89 mg/dL (ref 70–99)
Potassium: 4 mmol/L (ref 3.5–5.1)
Sodium: 139 mmol/L (ref 135–145)
Total Bilirubin: 0.9 mg/dL (ref 0.3–1.2)
Total Protein: 6.2 g/dL — ABNORMAL LOW (ref 6.5–8.1)

## 2021-03-13 LAB — FECAL FAT, QUALITATIVE
Fat Qual Neutral, Stl: NORMAL
Fat Qual Total, Stl: NORMAL

## 2021-03-13 NOTE — Progress Notes (Signed)
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  CT abdomen and pelvis showed changes consistent with acute pancreatitis in pancreatic tail with small pseudocyst and splenic vein occlusion with collaterals.  GI consulted  Assessment & Plan:   Principal Problem:   Acute on chronic pancreatitis (HCC) Active Problems:   Opioid use disorder, severe, dependence (HCC)   Abdominal pain   Acute on chronic pancreatitis in patient with history of necrotizing pancreatitis and pseudocyst previously treated with stents and AXIOS cystogastrostomy tube -CT abdomen and pelvis shows acute pancreatitis in the pancreatic tail with small fluid collection -Lipase, LFT and calcium within normal. -Patient endorsed significant pain with nausea and diarrhea but no further emesis. -GI recommendation appreciated-pain control -Patient has history of severe opiate dependence which complicates pain management.  Abdominal pain improving.  Tolerated full liquid diet.  She is agreeable to advance to soft diet.  We will do that.  She is getting closer to discharge.  Hopefully in 1 to 2 days.  She understands that.   Diarrhea: Likely due to the above.  C. difficile lactoferrin negative.  Fecal fat pending. -Continue Creon and colestipol   Nausea/emesis/abdominal pain-likely due to #1.  Still with nausea and some small amount of vomiting this morning.   Right cephalic vein DVT-recent diagnosis in 11/2020 at [facility].  Has  been on Eliquis. 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  I do not think she failed Eliquis.  Continue Eliquis.   Opioid use disorder, severe, dependence: On Suboxone at home.  Last fill on 02/17/2021 for 30-day supply per narcotic database. -transition to home Suboxone when pain improves   Mood disorder: Stable. -Continue home medications  DVT prophylaxis:    Code Status: Full Code  Family Communication: None present at bedside.  Plan of care discussed with patient in length and he verbalized understanding and agreed with it.  Status is: Inpatient  Remains inpatient appropriate because:IV treatments appropriate due to intensity of illness or inability to take PO  Dispo: The patient is from: Home              Anticipated d/c is to: Home              Patient currently is not medically stable to d/c.   Difficult to place patient No        Estimated body mass index is 29 kg/m as calculated from the following:   Height as of this encounter: 5\' 7"  (1.702 m).   Weight as of this encounter: 84 kg.      Nutritional status:               Consultants:  GI  Procedures:  None  Antimicrobials:  Anti-infectives (From admission, onward)    None          Subjective: Seen and examined.  Her pain is improved currently 6 out of 10.  She is tolerating liquid diet and wants me to advance her to soft diet.  She understands that she is getting closer to discharge.  Objective:  Vitals:   03/12/21 0819 03/12/21 1352 03/12/21 2102 03/13/21 0527  BP:  113/62 131/80 111/73  Pulse:  84 66 79  Resp:   18 18  Temp:   98.2 F (36.8 C) 98.2 F (36.8 C)  TempSrc:    Oral  SpO2: 97% 98% 100% 98%  Weight:      Height:       No intake or output data in the 24 hours ending 03/13/21 1053  Filed Weights   03/10/21 0210  Weight: 84 kg    Examination:  General exam: Appears calm and comfortable  Respiratory  system: Clear to auscultation. Respiratory effort normal. Cardiovascular system: S1 & S2 heard, RRR. No JVD, murmurs, rubs, gallops or clicks. No pedal edema. Gastrointestinal system: Abdomen is nondistended, soft and mild epigastric tenderness. No organomegaly or masses felt. Normal bowel sounds heard. Central nervous system: Alert and oriented. No focal neurological deficits. Extremities: Symmetric 5 x 5 power. Skin: No rashes, lesions or ulcers.  Psychiatry: Judgement and insight appear normal. Mood & affect appropriate.   Data Reviewed: I have personally reviewed following labs and imaging studies  CBC: Recent Labs  Lab 03/09/21 1821 03/10/21 0657 03/12/21 0506 03/13/21 0557  WBC 6.8 4.5 3.9* 5.0  NEUTROABS 4.7  --  1.8 2.7  HGB 14.2 12.2 11.8* 13.2  HCT 43.6 38.7 36.1 39.9  MCV 87.4 91.3 88.9 86.9  PLT 262 204 194 129*    Basic Metabolic Panel: Recent Labs  Lab 03/09/21 1821 03/10/21 0657 03/12/21 0506 03/13/21 0557  NA 136 138 142 139  K 3.5 3.5 3.4* 4.0  CL 100 106 107 105  CO2 27 25 29 26   GLUCOSE 85 84 89 89  BUN 6 6 <5* <5*  CREATININE 0.95 0.74 0.88 0.93  CALCIUM 10.1 9.2 8.8* 9.0    GFR: Estimated Creatinine Clearance: 85.1 mL/min (by C-G formula based on SCr of 0.93 mg/dL). Liver Function Tests: Recent Labs  Lab 03/09/21 1821 03/10/21 0657 03/12/21 0506 03/13/21 0557  AST 31 33 58* 93*  ALT 26 30 62* 107*  ALKPHOS 193* 166* 159* 182*  BILITOT 0.4 0.4 0.3 0.9  PROT 7.5 6.3* 5.9* 6.2*  ALBUMIN 4.2 3.4* 3.0* 3.2*    Recent Labs  Lab 03/09/21 1821  LIPASE 42    No results for input(s): AMMONIA in the last 168 hours. Coagulation Profile: No results for input(s): INR, PROTIME in the last 168 hours. Cardiac Enzymes: No results for input(s): CKTOTAL, CKMB, CKMBINDEX, TROPONINI in the last 168 hours. BNP (last 3 results) No results for input(s): PROBNP in the last 8760 hours. HbA1C: No results for input(s): HGBA1C in the last 72  hours. CBG: No results for input(s): GLUCAP in the last 168 hours. Lipid Profile: No results for input(s): CHOL, HDL, LDLCALC, TRIG, CHOLHDL, LDLDIRECT in the last 72 hours. Thyroid Function Tests: No results for input(s): TSH, T4TOTAL, FREET4, T3FREE, THYROIDAB in the last 72 hours. Anemia Panel: No results for input(s): VITAMINB12, FOLATE, FERRITIN, TIBC, IRON, RETICCTPCT in the last 72 hours. Sepsis Labs: No results for input(s): PROCALCITON, LATICACIDVEN in the last 168 hours.  Recent Results (from the past 240 hour(s))  Resp Panel by RT-PCR (Flu A&B, Covid) Nasopharyngeal Swab     Status: None   Collection Time: 03/09/21 11:43 PM   Specimen: Nasopharyngeal Swab; Nasopharyngeal(NP) swabs in vial transport medium  Result Value Ref Range Status   SARS Coronavirus 2 by RT PCR NEGATIVE NEGATIVE Final    Comment: (NOTE) SARS-CoV-2 target  nucleic acids are NOT DETECTED.  The SARS-CoV-2 RNA is generally detectable in upper respiratory specimens during the acute phase of infection. The lowest concentration of SARS-CoV-2 viral copies this assay can detect is 138 copies/mL. A negative result does not preclude SARS-Cov-2 infection and should not be used as the sole basis for treatment or other patient management decisions. A negative result may occur with  improper specimen collection/handling, submission of specimen other than nasopharyngeal swab, presence of viral mutation(s) within the areas targeted by this assay, and inadequate number of viral copies(<138 copies/mL). A negative result must be combined with clinical observations, patient history, and epidemiological information. The expected result is Negative.  Fact Sheet for Patients:  BloggerCourse.com  Fact Sheet for Healthcare Providers:  SeriousBroker.it  This test is no t yet approved or cleared by the [place] FDA and  has been authorized for detection and/or  diagnosis of SARS-CoV-2 by FDA under an Emergency Use Authorization (EUA). This EUA will remain  in effect (meaning this test can be used) for the duration of the COVID-19 declaration under Section 564(b)(1) of the Act, 21 U.S.C.section 360bbb-3(b)(1), unless the authorization is terminated  or revoked sooner.       Influenza A by PCR NEGATIVE NEGATIVE Final   Influenza B by PCR NEGATIVE NEGATIVE Final    Comment: (NOTE) The Xpert Xpress SARS-CoV-2/FLU/RSV plus assay is intended as an aid in the diagnosis of influenza from Nasopharyngeal swab specimens and should not be used as a sole basis for treatment. Nasal washings and aspirates are unacceptable for Xpert Xpress SARS-CoV-2/FLU/RSV testing.  Fact Sheet for Patients: BloggerCourse.com  Fact Sheet for Healthcare Providers: SeriousBroker.it  This test is not yet approved or cleared by the [place] FDA and has been authorized for detection and/or diagnosis of SARS-CoV-2 by FDA under an Emergency Use Authorization (EUA). This EUA will remain in effect (meaning this test can be used) for the duration of the COVID-19 declaration under Section 564(b)(1) of the Act, 21 U.S.C. section 360bbb-3(b)(1), unless the authorization is terminated or revoked.  Performed at [facility], [address] [place], [place] [address]   C Difficile Quick Screen w PCR reflex     Status: None   Collection Time: 03/10/21  2:01 PM   Specimen: STOOL  Result Value Ref Range Status   C Diff antigen NEGATIVE NEGATIVE Final   C Diff toxin NEGATIVE NEGATIVE Final   C Diff interpretation No C. difficile detected.  Final    Comment: Performed at [facility], [address] [place], [place] [address]       Radiology Studies: No results found.  Scheduled Meds:  apixaban  5 mg Oral BID   busPIRone  15 mg Oral TID   colestipol  1 g Oral BID   DULoxetine   60 mg Oral Daily   fluticasone furoate-vilanterol  1 puff Inhalation Daily   lipase/protease/amylase  36,000 Units Oral TID WC   mirtazapine  15 mg Oral QHS   montelukast  10 mg Oral Daily   multivitamin with minerals  1 tablet Oral Daily   pantoprazole  40 mg Oral BID AC   QUEtiapine  400 mg Oral QHS   sucralfate  1 g Oral BID   Continuous Infusions:  promethazine (PHENERGAN) injection (IM or IVPB) 12.5 mg (03/11/21 1830)     LOS: 3 days   Time spent: 28 min   Hughie Closs, MD [facility]  03/13/2021, 10:53 AM   How to  contact the [facility] Attending or Consulting provider [place] or covering provider during after hours [place], for this patient?  Check the care team in [facility] and look for a) attending/consulting TRH provider listed and b) the [facility] team listed. Page or secure chat 7A-7P. Log into [email or web address] and use [place]'s universal password to access. If you do not have the password, please contact the hospital operator. Locate the [facility] provider you are looking for under [facility] and page to a number that you can be directly reached. If you still have difficulty reaching the provider, please page the [facility] (Director on Call) for the Hospitalists listed on amion for assistance.

## 2021-03-14 ENCOUNTER — Inpatient Hospital Stay (HOSPITAL_COMMUNITY): Payer: Medicaid Other

## 2021-03-14 LAB — CBC WITH DIFFERENTIAL/PLATELET
Abs Immature Granulocytes: 0.02 10*3/uL (ref 0.00–0.07)
Basophils Absolute: 0.1 10*3/uL (ref 0.0–0.1)
Basophils Relative: 1 %
Eosinophils Absolute: 0.6 10*3/uL — ABNORMAL HIGH (ref 0.0–0.5)
Eosinophils Relative: 8 %
HCT: 39.5 % (ref 36.0–46.0)
Hemoglobin: 12.9 g/dL (ref 12.0–15.0)
Immature Granulocytes: 0 %
Lymphocytes Relative: 22 %
Lymphs Abs: 1.6 10*3/uL (ref 0.7–4.0)
MCH: 29 pg (ref 26.0–34.0)
MCHC: 32.7 g/dL (ref 30.0–36.0)
MCV: 88.8 fL (ref 80.0–100.0)
Monocytes Absolute: 0.6 10*3/uL (ref 0.1–1.0)
Monocytes Relative: 9 %
Neutro Abs: 4.4 10*3/uL (ref 1.7–7.7)
Neutrophils Relative %: 60 %
Platelets: 218 10*3/uL (ref 150–400)
RBC: 4.45 MIL/uL (ref 3.87–5.11)
RDW: 12.7 % (ref 11.5–15.5)
WBC: 7.3 10*3/uL (ref 4.0–10.5)
nRBC: 0 % (ref 0.0–0.2)

## 2021-03-14 LAB — COMPREHENSIVE METABOLIC PANEL
ALT: 117 U/L — ABNORMAL HIGH (ref 0–44)
AST: 81 U/L — ABNORMAL HIGH (ref 15–41)
Albumin: 3.4 g/dL — ABNORMAL LOW (ref 3.5–5.0)
Alkaline Phosphatase: 199 U/L — ABNORMAL HIGH (ref 38–126)
Anion gap: 7 (ref 5–15)
BUN: 7 mg/dL (ref 6–20)
CO2: 26 mmol/L (ref 22–32)
Calcium: 9 mg/dL (ref 8.9–10.3)
Chloride: 106 mmol/L (ref 98–111)
Creatinine, Ser: 1.01 mg/dL — ABNORMAL HIGH (ref 0.44–1.00)
GFR, Estimated: >60 mL/min (ref >60)
Glucose, Bld: 97 mg/dL (ref 70–99)
Potassium: 3.9 mmol/L (ref 3.5–5.1)
Sodium: 139 mmol/L (ref 135–145)
Total Bilirubin: 0.1 mg/dL — ABNORMAL LOW (ref 0.3–1.2)
Total Protein: 6.4 g/dL — ABNORMAL LOW (ref 6.5–8.1)

## 2021-03-14 MED ORDER — OXYCODONE HCL 5 MG PO TABS
10.0000 mg | ORAL_TABLET | Freq: Four times a day (QID) | ORAL | Status: DC | PRN
  Administered 2021-03-14 – 2021-03-15 (×4): 10 mg via ORAL
  Filled 2021-03-14 (×4): qty 2

## 2021-03-14 MED ORDER — GADOBUTROL 1 MMOL/ML IV SOLN
8.0000 mL | Freq: Once | INTRAVENOUS | Status: AC | PRN
  Administered 2021-03-14: 8 mL via INTRAVENOUS

## 2021-03-14 MED ORDER — MORPHINE SULFATE (PF) 2 MG/ML IV SOLN
2.0000 mg | Freq: Four times a day (QID) | INTRAVENOUS | Status: DC | PRN
  Administered 2021-03-14 – 2021-03-15 (×4): 2 mg via INTRAVENOUS
  Filled 2021-03-14 (×4): qty 1

## 2021-03-14 NOTE — Progress Notes (Signed)
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  CT abdomen and pelvis showed changes consistent with acute pancreatitis in pancreatic tail with small pseudocyst and splenic vein occlusion with collaterals.  GI consulted  Assessment & Plan:   Principal Problem:   Acute on chronic pancreatitis (HCC) Active Problems:   Opioid use disorder, severe, dependence (HCC)   Abdominal pain  Acute on chronic pancreatitis in patient with history of necrotizing pancreatitis and pseudocyst previously treated with stents and AXIOS cystogastrostomy tube -CT abdomen and pelvis shows acute pancreatitis in the pancreatic tail with small fluid collection -Lipase, LFT and calcium within normal. -Patient endorsed significant pain with nausea and diarrhea but no further emesis. -GI recommendation appreciated-pain control -Patient has history of severe opiate dependence which complicates pain management.  Not much improvement in abdominal pain compared to yesterday.  Tolerating soft diet.  Her LFTs have been trending up.  I will proceed with MRCP.  Continue her on soft diet.  We will start transitioning to oral oxycodone and reduce frequency and dose of morphine.   Diarrhea: Likely due to the above.  C. difficile lactoferrin negative.  Fecal fat pending. -Continue Creon and colestipol   Nausea/emesis/abdominal pain-likely due to #1.  Still with nausea and some small amount of vomiting this morning.   Right  cephalic vein DVT-recent diagnosis in 11/2020 at [facility].  Has been on Eliquis. 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  I do not think she failed Eliquis.  Continue Eliquis.   Opioid use disorder, severe, dependence: On Suboxone at home.  Last fill on 02/17/2021 for 30-day supply per narcotic database. -transition to home Suboxone when pain improves   Mood disorder: Stable. -Continue home medications  DVT prophylaxis:    Code Status: Full Code  Family Communication: None present at bedside.  Plan of care discussed with patient in length and he verbalized understanding and agreed with it.  Status is: Inpatient  Remains inpatient appropriate because:IV treatments appropriate due to intensity of illness or inability to take PO  Dispo: The patient is from: Home              Anticipated d/c is to: Home              Patient currently is not medically stable to d/c.   Difficult to place patient No        Estimated body mass index is 29 kg/m as calculated from the following:   Height as of this encounter: 5\' 7"  (1.702 m).   Weight as of this encounter: 84 kg.      Nutritional status:               Consultants:  GI  Procedures:  None  Antimicrobials:  Anti-infectives (From admission, onward)    None          Subjective: Seen and examined.  Still complains of abdominal pain with no improvement compared to yesterday.  Tolerating soft diet though.  No nausea.  Objective: Vitals:   03/13/21 0527 03/13/21 1358 03/13/21 2026  03/14/21 0520  BP: 111/73 102/73 111/65 107/68  Pulse: 79 85 83 77  Resp: 18 17 18 18   Temp: 98.2 F (36.8 C) 98.1 F (36.7 C) 98.2 F (36.8 C) 98.5 F (36.9 C)  TempSrc: Oral Oral  Oral  SpO2: 98% 94% 94% 95%  Weight:      Height:        Intake/Output Summary (Last 24 hours) at 03/14/2021 1031 Last data filed at 03/14/2021 0941 Gross per 24 hour  Intake 960 ml  Output  --  Net 960 ml    Filed Weights   03/10/21 0210  Weight: 84 kg    Examination:  General exam: Appears calm and comfortable  Respiratory system: Clear to auscultation. Respiratory effort normal. Cardiovascular system: S1 & S2 heard, RRR. No JVD, murmurs, rubs, gallops or clicks. No pedal edema. Gastrointestinal system: Abdomen is nondistended, soft and moderate epigastric and mild periumbilical tenderness, no organomegaly or masses felt. Normal bowel sounds heard. Central nervous system: Alert and oriented. No focal neurological deficits. Extremities: Symmetric 5 x 5 power. Skin: No rashes, lesions or ulcers.  Psychiatry: Judgement and insight appear normal. Mood & affect appropriate.    Data Reviewed: I have personally reviewed following labs and imaging studies  CBC: Recent Labs  Lab 03/09/21 1821 03/10/21 0657 03/12/21 0506 03/13/21 0557 03/14/21 0452  WBC 6.8 4.5 3.9* 5.0 7.3  NEUTROABS 4.7  --  1.8 2.7 4.4  HGB 14.2 12.2 11.8* 13.2 12.9  HCT 43.6 38.7 36.1 39.9 39.5  MCV 87.4 91.3 88.9 86.9 88.8  PLT 262 204 194 129* 218    Basic Metabolic Panel: Recent Labs  Lab 03/09/21 1821 03/10/21 0657 03/12/21 0506 03/13/21 0557 03/14/21 0452  NA 136 138 142 139 139  K 3.5 3.5 3.4* 4.0 3.9  CL 100 106 107 105 106  CO2 27 25 29 26 26   GLUCOSE 85 84 89 89 97  BUN 6 6 <5* <5* 7  CREATININE 0.95 0.74 0.88 0.93 1.01*  CALCIUM 10.1 9.2 8.8* 9.0 9.0    GFR: Estimated Creatinine Clearance: 78.4 mL/min (A) (by C-G formula based on SCr of 1.01 mg/dL (H)). Liver Function Tests: Recent Labs  Lab 03/09/21 1821 03/10/21 0657 03/12/21 0506 03/13/21 0557 03/14/21 0452  AST 31 33 58* 93* 81*  ALT 26 30 62* 107* 117*  ALKPHOS 193* 166* 159* 182* 199*  BILITOT 0.4 0.4 0.3 0.9 0.1*  PROT 7.5 6.3* 5.9* 6.2* 6.4*  ALBUMIN 4.2 3.4* 3.0* 3.2* 3.4*    Recent Labs  Lab 03/09/21 1821  LIPASE 42    No results for input(s): AMMONIA in the last 168 hours. Coagulation  Profile: No results for input(s): INR, PROTIME in the last 168 hours. Cardiac Enzymes: No results for input(s): CKTOTAL, CKMB, CKMBINDEX, TROPONINI in the last 168 hours. BNP (last 3 results) No results for input(s): PROBNP in the last 8760 hours. HbA1C: No results for input(s): HGBA1C in the last 72 hours. CBG: No results for input(s): GLUCAP in the last 168 hours. Lipid Profile: No results for input(s): CHOL, HDL, LDLCALC, TRIG, CHOLHDL, LDLDIRECT in the last 72 hours. Thyroid Function Tests: No results for input(s): TSH, T4TOTAL, FREET4, T3FREE, THYROIDAB in the last 72 hours. Anemia Panel: No results for input(s): VITAMINB12, FOLATE, FERRITIN, TIBC, IRON, RETICCTPCT in the last 72 hours. Sepsis Labs: No results for input(s): PROCALCITON, LATICACIDVEN in the last 168 hours.  Recent Results (from the past 240 hour(s))  Resp Panel by RT-PCR (Flu A&B, Covid) Nasopharyngeal  Swab     Status: None   Collection Time: 03/09/21 11:43 PM   Specimen: Nasopharyngeal Swab; Nasopharyngeal(NP) swabs in vial transport medium  Result Value Ref Range Status   SARS Coronavirus 2 by RT PCR NEGATIVE NEGATIVE Final    Comment: (NOTE) SARS-CoV-2 target nucleic acids are NOT DETECTED.  The SARS-CoV-2 RNA is generally detectable in upper respiratory specimens during the acute phase of infection. The lowest concentration of SARS-CoV-2 viral copies this assay can detect is 138 copies/mL. A negative result does not preclude SARS-Cov-2 infection and should not be used as the sole basis for treatment or other patient management decisions. A negative result may occur with  improper specimen collection/handling, submission of specimen other than nasopharyngeal swab, presence of viral mutation(s) within the areas targeted by this assay, and inadequate number of viral copies(<138 copies/mL). A negative result must be combined with clinical observations, patient history, and epidemiological information. The  expected result is Negative.  Fact Sheet for Patients:  BloggerCourse.com  Fact Sheet for Healthcare Providers:  SeriousBroker.it  This test is no t yet approved or cleared by the [place] FDA and  has been authorized for detection and/or diagnosis of SARS-CoV-2 by FDA under an Emergency Use Authorization (EUA). This EUA will remain  in effect (meaning this test can be used) for the duration of the COVID-19 declaration under Section 564(b)(1) of the Act, 21 U.S.C.section 360bbb-3(b)(1), unless the authorization is terminated  or revoked sooner.       Influenza A by PCR NEGATIVE NEGATIVE Final   Influenza B by PCR NEGATIVE NEGATIVE Final    Comment: (NOTE) The Xpert Xpress SARS-CoV-2/FLU/RSV plus assay is intended as an aid in the diagnosis of influenza from Nasopharyngeal swab specimens and should not be used as a sole basis for treatment. Nasal washings and aspirates are unacceptable for Xpert Xpress SARS-CoV-2/FLU/RSV testing.  Fact Sheet for Patients: BloggerCourse.com  Fact Sheet for Healthcare Providers: SeriousBroker.it  This test is not yet approved or cleared by the [place] FDA and has been authorized for detection and/or diagnosis of SARS-CoV-2 by FDA under an Emergency Use Authorization (EUA). This EUA will remain in effect (meaning this test can be used) for the duration of the COVID-19 declaration under Section 564(b)(1) of the Act, 21 U.S.C. section 360bbb-3(b)(1), unless the authorization is terminated or revoked.  Performed at [facility], [address] [place], [place] [address]   C Difficile Quick Screen w PCR reflex     Status: None   Collection Time: 03/10/21  2:01 PM   Specimen: STOOL  Result Value Ref Range Status   C Diff antigen NEGATIVE NEGATIVE Final   C Diff toxin NEGATIVE NEGATIVE Final   C Diff interpretation  No C. difficile detected.  Final    Comment: Performed at [facility], [address], [place], [place] [address]       Radiology Studies: No results found.  Scheduled Meds:  apixaban  5 mg Oral BID   busPIRone  15 mg Oral TID   colestipol  1 g Oral BID   DULoxetine  60 mg Oral Daily   fluticasone furoate-vilanterol  1 puff Inhalation Daily   lipase/protease/amylase  36,000 Units Oral TID WC   mirtazapine  15 mg Oral QHS   montelukast  10 mg Oral Daily   multivitamin with minerals  1 tablet Oral Daily   pantoprazole  40 mg Oral BID AC   QUEtiapine  400 mg Oral QHS  sucralfate  1 g Oral BID   Continuous Infusions:  promethazine (PHENERGAN) injection (IM or IVPB) 12.5 mg (03/11/21 1830)     LOS: 4 days   Time spent: 27 min   Hughie Clossavi Ferol Laiche, MD [facility]  03/14/2021, 10:31 AM   How to contact the [facility] Attending or Consulting provider 7A - 7P or covering provider during after hours 7P -7A, for this patient?  Check the care team in [facility] and look for a) attending/consulting [facility] provider listed and b) the [facility] team listed. Page or secure chat 7A-7P. Log into [email or web address] and use 's universal password to access. If you do not have the password, please contact the hospital operator. Locate the [facility] provider you are looking for under [facility] and page to a number that you can be directly reached. If you still have difficulty reaching the provider, please page the [facility] (Director on Call) for the Hospitalists listed on amion for assistance.

## 2021-03-15 LAB — COMPREHENSIVE METABOLIC PANEL
ALT: 97 U/L — ABNORMAL HIGH (ref 0–44)
AST: 43 U/L — ABNORMAL HIGH (ref 15–41)
Albumin: 3.7 g/dL (ref 3.5–5.0)
Alkaline Phosphatase: 219 U/L — ABNORMAL HIGH (ref 38–126)
Anion gap: 7 (ref 5–15)
BUN: 8 mg/dL (ref 6–20)
CO2: 29 mmol/L (ref 22–32)
Calcium: 9.5 mg/dL (ref 8.9–10.3)
Chloride: 104 mmol/L (ref 98–111)
Creatinine, Ser: 0.95 mg/dL (ref 0.44–1.00)
GFR, Estimated: >60 mL/min (ref >60)
Glucose, Bld: 93 mg/dL (ref 70–99)
Potassium: 4.3 mmol/L (ref 3.5–5.1)
Sodium: 140 mmol/L (ref 135–145)
Total Bilirubin: 0.4 mg/dL (ref 0.3–1.2)
Total Protein: 7.3 g/dL (ref 6.5–8.1)

## 2021-03-15 LAB — CBC WITH DIFFERENTIAL/PLATELET
Abs Immature Granulocytes: 0.02 10*3/uL (ref 0.00–0.07)
Basophils Absolute: 0.1 10*3/uL (ref 0.0–0.1)
Basophils Relative: 1 %
Eosinophils Absolute: 0.5 10*3/uL (ref 0.0–0.5)
Eosinophils Relative: 8 %
HCT: 41.8 % (ref 36.0–46.0)
Hemoglobin: 13.3 g/dL (ref 12.0–15.0)
Immature Granulocytes: 0 %
Lymphocytes Relative: 22 %
Lymphs Abs: 1.3 10*3/uL (ref 0.7–4.0)
MCH: 28.5 pg (ref 26.0–34.0)
MCHC: 31.8 g/dL (ref 30.0–36.0)
MCV: 89.5 fL (ref 80.0–100.0)
Monocytes Absolute: 0.5 10*3/uL (ref 0.1–1.0)
Monocytes Relative: 9 %
Neutro Abs: 3.7 10*3/uL (ref 1.7–7.7)
Neutrophils Relative %: 60 %
Platelets: 223 10*3/uL (ref 150–400)
RBC: 4.67 MIL/uL (ref 3.87–5.11)
RDW: 12.8 % (ref 11.5–15.5)
WBC: 6.1 10*3/uL (ref 4.0–10.5)
nRBC: 0 % (ref 0.0–0.2)

## 2021-03-15 LAB — GLUCOSE, CAPILLARY: Glucose-Capillary: 81 mg/dL (ref 70–99)

## 2021-03-15 NOTE — Discharge Summary (Signed)
Physician Discharge Summary  Tricia Brown ZOX:[id number] DOB: 12-03-1975 DOA: 03/09/2021  PCP: [name], MD  Admit date: 03/09/2021 Discharge date: 03/15/2021 30 Day Unplanned Readmission Risk Score    Flowsheet Row ED to Hosp-Admission (Current) from 03/09/2021 in [facility]  30 Day Unplanned Readmission Risk Score (%) 29.4 Filed at 03/15/2021 0800       This score is the patient's risk of an unplanned readmission within 30 days of being discharged (0 -100%). The score is based on dignosis, age, lab data, medications, orders, and past utilization.   Low:  0-14.9   Medium: 15-21.9   High: 22-29.9   Extreme: 30 and above           Admitted From: Home Disposition: Home  Recommendations for Outpatient Follow-up:  Follow up with PCP in 1-2 weeks Please obtain CMP/CBC in one week Please follow up with your PCP on the following pending results: Unresulted Labs (From admission, onward)     Start     Ordered   03/15/21 0936  Comprehensive metabolic panel  Once,   R       Question:  Specimen collection method  Answer:  Lab=Lab collect   03/15/21 0936   03/15/21 0500  Comprehensive metabolic panel  Daily,   R     Question:  Specimen collection method  Answer:  Lab=Lab collect   03/14/21 1034   03/12/21 0500  CBC with Differential/Platelet  Daily,   R     Question:  Specimen collection method  Answer:  Lab=Lab collect   03/11/21 1340              Home Health: None Equipment/Devices: None  Discharge Condition: Stable CODE STATUS: Full code Diet recommendation: Soft and then advance to regular as tolerated  Subjective: Seen and examined.  Still with abdominal pain but improving and very close to her baseline.  She desires to go home.  Tolerating soft diet.  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  CT abdomen and pelvis showed changes consistent with acute pancreatitis in pancreatic tail with small pseudocyst and splenic vein occlusion with collaterals.  GI consulted. Lipase, LFT and calcium within normal.  She was managed conservatively with pain medications and n.p.o. and subsequently her pain improved and she was started on clears and diet advanced to soft diet.  Her pain has improved although not back to baseline but very close to baseline.  Desires to go home.  Her LFTs for some reason started to elevate while she was here.  For that reason, MRCP was done yesterday which ruled out any acute pathology and it actually showed that her acute pancreatitis has resolved.  Her LFTs today have improved somewhat.  She seems to have chronically elevated alkaline phosphatase.  Since she is tolerating soft diet and her pain is very close to her baseline and the fact that she herself is requesting a discharge, she is going to be discharged home in stable condition.   Diarrhea: Likely due to the above.  C. Difficile, lactoferrin negative.  Fecal fat pending. -Continue Creon and colestipol   Right cephalic vein DVT-recent diagnosis in 11/2020 at [facility].  Has been on Eliquis. 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  I do not think she failed Eliquis.  Continue Eliquis.   Opioid use disorder, severe, dependence: On Suboxone at home.  Last fill on 02/17/2021 for 30-day supply per narcotic database. transition to home Suboxone when pain improves  Discharge Diagnoses:  Principal Problem:   Acute on chronic pancreatitis [name]) Active Problems:   Opioid use disorder, severe,  dependence (HCC)   Abdominal pain    Discharge Instructions   Allergies as of 03/15/2021   No Known Allergies      Medication List     TAKE these medications    budesonide-formoterol 80-4.5 MCG/ACT inhaler Commonly known as: SYMBICORT Inhale 1 puff into the lungs daily.   busPIRone 15 MG tablet Commonly known as: BUSPAR Take 15 mg by mouth 3 (three) times daily.   colestipol 1 g tablet Commonly known as: COLESTID Take 1 tablet (1 g total) by mouth 2 (two) times daily. Hold if constipation   DULoxetine 60 MG capsule Commonly known as: CYMBALTA Take 60 mg by mouth daily.   Eliquis 5 MG Tabs tablet Generic drug: apixaban Take 1 tablet by mouth 2 (two) times daily.   lipase/protease/amylase 14782 UNITS Cpep capsule Commonly known as: CREON Take 2 capsules (72,000 Units total) by mouth daily. What changed:  how much to take when to take this   mirtazapine 15 MG tablet Commonly known as: REMERON Take 15 mg by mouth at bedtime.   montelukast 10 MG tablet Commonly known as: SINGULAIR Take 10 mg by mouth daily.   MULTIVITAMIN ADULT PO Take 1 tablet by mouth daily.   ondansetron 4 MG tablet Commonly known as: ZOFRAN Take 1 tablet (4 mg total) by mouth every 8 (eight) hours as needed for nausea or vomiting.   pantoprazole 40 MG tablet Commonly known as: PROTONIX Take 1 tablet (40 mg total) by mouth 2 (two) times daily before a meal.   polyethylene glycol powder 17 GM/SCOOP powder Commonly known as: MiraLax Take 17 g by mouth 2 (two) times daily as needed for moderate constipation.   QUEtiapine 400 MG 24 hr tablet Commonly known as: SEROQUEL XR Take 400 mg by mouth at bedtime.   QUEtiapine 25 MG tablet Commonly known as: SEROQUEL Take 25 mg by mouth 4 (four) times daily as needed.   Suboxone 8-2 MG Film Generic drug: Buprenorphine HCl-Naloxone HCl Place 1 Film under the tongue daily.   sucralfate 1 g tablet Commonly known as: CARAFATE Take 1 tablet  (1 g total) by mouth 2 (two) times daily. What changed: when to take this   valACYclovir 500 MG tablet Commonly known as: VALTREX Take 500 mg by mouth as needed (outbreaks).        Follow-up Information     [name], MD Follow up in 1 week(s).   Specialty: Family Medicine Contact information: [address] [place] [address] [phone]                No Known Allergies  Consultations: GI   Procedures/Studies: CT ABDOMEN PELVIS W CONTRAST  Result Date: 03/09/2021 CLINICAL DATA:  Right lower quadrant abdominal pain. Appendicitis suspected. Patient reports urinary frequency and dysuria. Patient reports daily pain due to chronic pancreatitis. EXAM: CT ABDOMEN AND PELVIS WITH CONTRAST TECHNIQUE: Multidetector CT imaging of the abdomen and pelvis was performed using the standard protocol following bolus administration of intravenous contrast. CONTRAST:  75mL OMNIPAQUE IOHEXOL 300 MG/ML  SOLN  COMPARISON:  Abdominal CT 09/26/2020.  Abdominal MRI 09/28/2020 FINDINGS: Lower chest: Hypoventilatory atelectasis without confluent airspace disease or pleural effusion. Heart is normal in size. Hepatobiliary: No focal liver abnormality is seen. Status post cholecystectomy. No biliary dilatation. Pancreas: Mild fat stranding and edema about the distal pancreatic body and tail, for example series 2, image 18. Small fluid collection about the pancreatic tail abuts the stomach and measures approximately 1.8 x 1.5 x 1.6 cm, series 2, image 18. This may represent contraction of the previous lesser sac fluid collection which is no longer seen. No other pancreatic collection. Mild chronic atrophy of the pancreatic head and body. No pancreatic necrosis. No ductal dilatation or pancreatic mass. Spleen: Normal in size without focal abnormality. Splenule at the hilum. Adrenals/Urinary Tract: No adrenal nodule. No hydronephrosis or perinephric edema. Homogeneous renal enhancement with symmetric  excretion on delayed phase imaging. No focal lesion or renal calculi. Urinary bladder is physiologically distended without wall thickening. Stomach/Bowel: Decompressed stomach. There is no small bowel obstruction, administered enteric contrast reaches the colon. No small bowel inflammation. Terminal ileum is normal without inflammation. Normal appendix, series 2, image 63. Occasional fluid levels throughout the colon. No colonic wall thickening or pericolonic edema. Vascular/Lymphatic: Normal caliber abdominal aorta. Patent portal vein. The splenic vein is attenuated with left upper quadrant collaterals suggesting splenic vein occlusion, new from prior exam but likely developed in the interim. No abdominopelvic adenopathy. Reproductive: Hysterectomy.  No adnexal mass. Other: No ascites. No free air. Small fat containing umbilical hernia. Musculoskeletal: There are no acute or suspicious osseous abnormalities. IMPRESSION: 1. Normal appendix. 2. Mild fat stranding and edema about the distal pancreatic body and tail, consistent with acute pancreatitis. Small fluid collection about the pancreatic tail abuts the stomach and measures 1.8 x 1.5 x 1.6 cm. This may represent contraction of the previous lesser sac fluid collection. 3. Occasional fluid levels throughout the colon, can be seen with diarrheal illness. No colonic wall thickening or pericolonic edema. 4. 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 Electronically Signed   By: Narda Rutherford M.D.   On: 03/09/2021 20:42   MR 3D Recon At Scanner  Result Date: 03/14/2021 CLINICAL DATA:  History of necrotizing pancreatitis. Abdominal pain. EXAM: MRI ABDOMEN WITHOUT AND WITH CONTRAST (INCLUDING MRCP) TECHNIQUE: Multiplanar multisequence MR imaging of the abdomen was performed both before and after the administration of intravenous contrast. Heavily  T2-weighted images of the biliary and pancreatic ducts were obtained, and three-dimensional MRCP images were rendered by post processing. CONTRAST:  8mL GADAVIST GADOBUTROL 1 MMOL/ML IV SOLN COMPARISON:  03/09/2021 CT.  Prior MRI of 09/28/2020. FINDINGS: Lower chest: Normal heart size without pericardial or pleural effusion. Hepatobiliary: Hepatomegaly, including at 20.0 cm craniocaudal. No focal liver lesion. Cholecystectomy, without biliary duct dilatation or choledocholithiasis. Pancreas: Pancreatic atrophy. Resolution of previously described peripancreatic inflammation. Minimally peripherally enhancing fluid signal lesion about the ventral pancreatic tail, impressing into the urinary bladder measures 2.2 x 1.7 cm on 17/3 versus similar on the prior exam (when remeasured). Communicates with the mildly dilated upstream pancreatic duct including on images 46 through 53 of series 13. No evidence of pancreatic necrosis.  No pancreas divisum. Spleen:  Normal in size, without focal abnormality. Adrenals/Urinary Tract: Normal adrenal glands. Normal kidneys, without hydronephrosis. Stomach/Bowel: Otherwise normal stomach. Colonic stool burden suggests constipation. Normal caliber of small bowel loops. Vascular/Lymphatic: Aortic atherosclerosis. Patent portal vein. Although the splenic  vein is difficult to follow, gastroepiploic collaterals suggest underlying splenic vein insufficiency. These are new since the MRI of 09/28/2020, including on 67/25. No retroperitoneal or retrocrural adenopathy. Other:  No ascites. Musculoskeletal: Mild convex right thoracic spine curvature. IMPRESSION: 1. Since the CT of 3 days ago, similar size of a presumed pseudocyst arising from the ventral pancreatic tail, impressing into the stomach. This is decreased in size and more well-defined compared to the comparison MRI of 09/28/2020. 2. Resolution of acute pancreatitis. 3. No choledocholithiasis or pancreas divisum. 4.  Aortic  Atherosclerosis (ICD10-I70.0). Electronically Signed   By: Jeronimo Greaves M.D.   On: 03/14/2021 14:47   MR ABDOMEN MRCP W WO CONTAST  Result Date: 03/14/2021 CLINICAL DATA:  History of necrotizing pancreatitis. Abdominal pain. EXAM: MRI ABDOMEN WITHOUT AND WITH CONTRAST (INCLUDING MRCP) TECHNIQUE: Multiplanar multisequence MR imaging of the abdomen was performed both before and after the administration of intravenous contrast. Heavily T2-weighted images of the biliary and pancreatic ducts were obtained, and three-dimensional MRCP images were rendered by post processing. CONTRAST:  86mL GADAVIST GADOBUTROL 1 MMOL/ML IV SOLN COMPARISON:  03/09/2021 CT.  Prior MRI of 09/28/2020. FINDINGS: Lower chest: Normal heart size without pericardial or pleural effusion. Hepatobiliary: Hepatomegaly, including at 20.0 cm craniocaudal. No focal liver lesion. Cholecystectomy, without biliary duct dilatation or choledocholithiasis. Pancreas: Pancreatic atrophy. Resolution of previously described peripancreatic inflammation. Minimally peripherally enhancing fluid signal lesion about the ventral pancreatic tail, impressing into the urinary bladder measures 2.2 x 1.7 cm on 17/3 versus similar on the prior exam (when remeasured). Communicates with the mildly dilated upstream pancreatic duct including on images 46 through 53 of series 13. No evidence of pancreatic necrosis.  No pancreas divisum. Spleen:  Normal in size, without focal abnormality. Adrenals/Urinary Tract: Normal adrenal glands. Normal kidneys, without hydronephrosis. Stomach/Bowel: Otherwise normal stomach. Colonic stool burden suggests constipation. Normal caliber of small bowel loops. Vascular/Lymphatic: Aortic atherosclerosis. Patent portal vein. Although the splenic vein is difficult to follow, gastroepiploic collaterals suggest underlying splenic vein insufficiency. These are new since the MRI of 09/28/2020, including on 67/25. No retroperitoneal or retrocrural  adenopathy. Other:  No ascites. Musculoskeletal: Mild convex right thoracic spine curvature. IMPRESSION: 1. Since the CT of 3 days ago, similar size of a presumed pseudocyst arising from the ventral pancreatic tail, impressing into the stomach. This is decreased in size and more well-defined compared to the comparison MRI of 09/28/2020. 2. Resolution of acute pancreatitis. 3. No choledocholithiasis or pancreas divisum. 4.  Aortic Atherosclerosis (ICD10-I70.0). Electronically Signed   By: Jeronimo Greaves M.D.   On: 03/14/2021 14:47     Discharge Exam: Vitals:   03/14/21 2004 03/15/21 0518  BP: 114/72 96/69  Pulse: 80 83  Resp: 20 20  Temp: 98.2 F (36.8 C) (!) 97.4 F (36.3 C)  SpO2: 95% 95%   Vitals:   03/14/21 0520 03/14/21 1454 03/14/21 2004 03/15/21 0518  BP: 107/68 111/72 114/72 96/69  Pulse: 77 85 80 83  Resp: 18 16 20 20   Temp: 98.5 F (36.9 C) 98.1 F (36.7 C) 98.2 F (36.8 C) (!) 97.4 F (36.3 C)  TempSrc: Oral Oral Oral   SpO2: 95% 99% 95% 95%  Weight:      Height:        General: Pt is alert, awake, not in acute distress Cardiovascular: RRR, S1/S2 +, no rubs, no gallops Respiratory: CTA bilaterally, no wheezing, no rhonchi Abdominal: Soft, mild epigastric tenderness, ND, bowel sounds + Extremities: no edema, no cyanosis  The results of significant diagnostics from this hospitalization (including imaging, microbiology, ancillary and laboratory) are listed below for reference.     Microbiology: Recent Results (from the past 240 hour(s))  Resp Panel by RT-PCR (Flu A&B, Covid) Nasopharyngeal Swab     Status: None   Collection Time: 03/09/21 11:43 PM   Specimen: Nasopharyngeal Swab; Nasopharyngeal(NP) swabs in vial transport medium  Result Value Ref Range Status   SARS Coronavirus 2 by RT PCR NEGATIVE NEGATIVE Final    Comment: (NOTE) SARS-CoV-2 target nucleic acids are NOT DETECTED.  The SARS-CoV-2 RNA is generally detectable in upper respiratory specimens  during the acute phase of infection. The lowest concentration of SARS-CoV-2 viral copies this assay can detect is 138 copies/mL. A negative result does not preclude SARS-Cov-2 infection and should not be used as the sole basis for treatment or other patient management decisions. A negative result may occur with  improper specimen collection/handling, submission of specimen other than nasopharyngeal swab, presence of viral mutation(s) within the areas targeted by this assay, and inadequate number of viral copies(<138 copies/mL). A negative result must be combined with clinical observations, patient history, and epidemiological information. The expected result is Negative.  Fact Sheet for Patients:  BloggerCourse.comhttps://www.fda.gov/media/152166/download  Fact Sheet for Healthcare Providers:  SeriousBroker.ithttps://www.fda.gov/media/152162/download  This test is no t yet approved or cleared by the [place] FDA and  has been authorized for detection and/or diagnosis of SARS-CoV-2 by FDA under an Emergency Use Authorization (EUA). This EUA will remain  in effect (meaning this test can be used) for the duration of the COVID-19 declaration under Section 564(b)(1) of the Act, 21 U.S.C.section 360bbb-3(b)(1), unless the authorization is terminated  or revoked sooner.       Influenza A by PCR NEGATIVE NEGATIVE Final   Influenza B by PCR NEGATIVE NEGATIVE Final    Comment: (NOTE) The Xpert Xpress SARS-CoV-2/FLU/RSV plus assay is intended as an aid in the diagnosis of influenza from Nasopharyngeal swab specimens and should not be used as a sole basis for treatment. Nasal washings and aspirates are unacceptable for Xpert Xpress SARS-CoV-2/FLU/RSV testing.  Fact Sheet for Patients: BloggerCourse.comhttps://www.fda.gov/media/152166/download  Fact Sheet for Healthcare Providers: SeriousBroker.ithttps://www.fda.gov/media/152162/download  This test is not yet approved or cleared by the [place] FDA and has been authorized for detection  and/or diagnosis of SARS-CoV-2 by FDA under an Emergency Use Authorization (EUA). This EUA will remain in effect (meaning this test can be used) for the duration of the COVID-19 declaration under Section 564(b)(1) of the Act, 21 U.S.C. section 360bbb-3(b)(1), unless the authorization is terminated or revoked.  Performed at [facility], [address], [place], [place] [address]   C Difficile Quick Screen w PCR reflex     Status: None   Collection Time: 03/10/21  2:01 PM   Specimen: STOOL  Result Value Ref Range Status   C Diff antigen NEGATIVE NEGATIVE Final   C Diff toxin NEGATIVE NEGATIVE Final   C Diff interpretation No C. difficile detected.  Final    Comment: Performed at [facility], [address] [place], [place] [address]     Labs: BNP (last 3 results) No results for input(s): BNP in the last 8760 hours. Basic Metabolic Panel: Recent Labs  Lab 03/10/21 0657 03/12/21 0506 03/13/21 0557 03/14/21 0452 03/15/21 0616  NA 138 142 139 139 140  K 3.5 3.4* 4.0 3.9 4.3  CL 106 107 105 106 104  CO2 25 29 26 26 29   GLUCOSE 84 89  89 97 93  BUN 6 <5* <5* 7 8  CREATININE 0.74 0.88 0.93 1.01* 0.95  CALCIUM 9.2 8.8* 9.0 9.0 9.5   Liver Function Tests: Recent Labs  Lab 03/10/21 0657 03/12/21 0506 03/13/21 0557 03/14/21 0452 03/15/21 0616  AST 33 58* 93* 81* 43*  ALT 30 62* 107* 117* 97*  ALKPHOS 166* 159* 182* 199* 219*  BILITOT 0.4 0.3 0.9 0.1* 0.4  PROT 6.3* 5.9* 6.2* 6.4* 7.3  ALBUMIN 3.4* 3.0* 3.2* 3.4* 3.7   Recent Labs  Lab 03/09/21 1821  LIPASE 42   No results for input(s): AMMONIA in the last 168 hours. CBC: Recent Labs  Lab 03/09/21 1821 03/10/21 0657 03/12/21 0506 03/13/21 0557 03/14/21 0452 03/15/21 0616  WBC 6.8 4.5 3.9* 5.0 7.3 6.1  NEUTROABS 4.7  --  1.8 2.7 4.4 3.7  HGB 14.2 12.2 11.8* 13.2 12.9 13.3  HCT 43.6 38.7 36.1 39.9 39.5 41.8  MCV 87.4 91.3 88.9 86.9 88.8 89.5  PLT 262 204 194 129* 218  223   Cardiac Enzymes: No results for input(s): CKTOTAL, CKMB, CKMBINDEX, TROPONINI in the last 168 hours. BNP: Invalid input(s): POCBNP CBG: Recent Labs  Lab 03/15/21 0744  GLUCAP 81   D-Dimer No results for input(s): DDIMER in the last 72 hours. Hgb A1c No results for input(s): HGBA1C in the last 72 hours. Lipid Profile No results for input(s): CHOL, HDL, LDLCALC, TRIG, CHOLHDL, LDLDIRECT in the last 72 hours. Thyroid function studies No results for input(s): TSH, T4TOTAL, T3FREE, THYROIDAB in the last 72 hours.  Invalid input(s): FREET3 Anemia work up No results for input(s): VITAMINB12, FOLATE, FERRITIN, TIBC, IRON, RETICCTPCT in the last 72 hours. Urinalysis    Component Value Date/Time   COLORURINE YELLOW 03/09/2021 1540   APPEARANCEUR CLEAR 03/09/2021 1540   APPEARANCEUR CLEAR 03/23/2013 1621   LABSPEC 1.016 03/09/2021 1540   LABSPEC 1.021 03/23/2013 1621   PHURINE 7.0 03/09/2021 1540   GLUCOSEU NEGATIVE 03/09/2021 1540   GLUCOSEU see comment 03/23/2013 1621   HGBUR TRACE (A) 03/09/2021 1540   BILIRUBINUR NEGATIVE 03/09/2021 1540   BILIRUBINUR see comment 03/23/2013 1621   KETONESUR NEGATIVE 03/09/2021 1540   PROTEINUR 30 (A) 03/09/2021 1540   UROBILINOGEN 1.0 09/29/2014 2116   NITRITE NEGATIVE 03/09/2021 1540   LEUKOCYTESUR NEGATIVE 03/09/2021 1540   LEUKOCYTESUR see comment 03/23/2013 1621   Sepsis Labs Invalid input(s): PROCALCITONIN,  WBC,  LACTICIDVEN Microbiology Recent Results (from the past 240 hour(s))  Resp Panel by RT-PCR (Flu A&B, Covid) Nasopharyngeal Swab     Status: None   Collection Time: 03/09/21 11:43 PM   Specimen: Nasopharyngeal Swab; Nasopharyngeal(NP) swabs in vial transport medium  Result Value Ref Range Status   SARS Coronavirus 2 by RT PCR NEGATIVE NEGATIVE Final    Comment: (NOTE) SARS-CoV-2 target nucleic acids are NOT DETECTED.  The SARS-CoV-2 RNA is generally detectable in upper respiratory specimens during the acute phase  of infection. The lowest concentration of SARS-CoV-2 viral copies this assay can detect is 138 copies/mL. A negative result does not preclude SARS-Cov-2 infection and should not be used as the sole basis for treatment or other patient management decisions. A negative result may occur with  improper specimen collection/handling, submission of specimen other than nasopharyngeal swab, presence of viral mutation(s) within the areas targeted by this assay, and inadequate number of viral copies(<138 copies/mL). A negative result must be combined with clinical observations, patient history, and epidemiological information. The expected result is Negative.  Fact Sheet for Patients:  BloggerCourse.com  Fact Sheet for Healthcare Providers:  SeriousBroker.it  This test is no t yet approved or cleared by the [place] FDA and  has been authorized for detection and/or diagnosis of SARS-CoV-2 by FDA under an Emergency Use Authorization (EUA). This EUA will remain  in effect (meaning this test can be used) for the duration of the COVID-19 declaration under Section 564(b)(1) of the Act, 21 U.S.C.section 360bbb-3(b)(1), unless the authorization is terminated  or revoked sooner.       Influenza A by PCR NEGATIVE NEGATIVE Final   Influenza B by PCR NEGATIVE NEGATIVE Final    Comment: (NOTE) The Xpert Xpress SARS-CoV-2/FLU/RSV plus assay is intended as an aid in the diagnosis of influenza from Nasopharyngeal swab specimens and should not be used as a sole basis for treatment. Nasal washings and aspirates are unacceptable for Xpert Xpress SARS-CoV-2/FLU/RSV testing.  Fact Sheet for Patients: BloggerCourse.com  Fact Sheet for Healthcare Providers: SeriousBroker.it  This test is not yet approved or cleared by the [place] FDA and has been authorized for detection and/or diagnosis of  SARS-CoV-2 by FDA under an Emergency Use Authorization (EUA). This EUA will remain in effect (meaning this test can be used) for the duration of the COVID-19 declaration under Section 564(b)(1) of the Act, 21 U.S.C. section 360bbb-3(b)(1), unless the authorization is terminated or revoked.  Performed at [facility], [address] [place], [place] [address]   C Difficile Quick Screen w PCR reflex     Status: None   Collection Time: 03/10/21  2:01 PM   Specimen: STOOL  Result Value Ref Range Status   C Diff antigen NEGATIVE NEGATIVE Final   C Diff toxin NEGATIVE NEGATIVE Final   C Diff interpretation No C. difficile detected.  Final    Comment: Performed at [facility], [address] [place], [place] [address]     Time coordinating discharge: Over 30 minutes  SIGNED:   Hughie Closs, MD  [facility] 03/15/2021, 9:43 AM  If 7PM-7AM, please contact night-coverage [email or web address]

## 2021-03-15 NOTE — Progress Notes (Signed)
Patient discharged via wheelchair  accompanied by staff. Patient is alert and oriented, no distress. Discharge instruction and education discussed with patient, patient verbalized understanding. All patient belongings are with the family.

## 2021-03-24 ENCOUNTER — Inpatient Hospital Stay (HOSPITAL_COMMUNITY)
Admission: EM | Admit: 2021-03-24 | Discharge: 2021-03-27 | DRG: 872 | Disposition: A | Discharge status: Home / Self Care | Payer: Medicaid Other | Attending: Internal Medicine | Admitting: Internal Medicine

## 2021-03-24 ENCOUNTER — Other Ambulatory Visit: Payer: Self-pay

## 2021-03-24 ENCOUNTER — Encounter (HOSPITAL_COMMUNITY): Payer: Self-pay

## 2021-03-24 ENCOUNTER — Emergency Department (HOSPITAL_COMMUNITY): Payer: Medicaid Other

## 2021-03-24 DIAGNOSIS — Z20822 Contact with and (suspected) exposure to covid-19: Secondary | ICD-10-CM | POA: Diagnosis present

## 2021-03-24 DIAGNOSIS — Z90710 Acquired absence of both cervix and uterus: Secondary | ICD-10-CM

## 2021-03-24 DIAGNOSIS — N12 Tubulo-interstitial nephritis, not specified as acute or chronic: Secondary | ICD-10-CM | POA: Diagnosis not present

## 2021-03-24 DIAGNOSIS — Z9689 Presence of other specified functional implants: Secondary | ICD-10-CM | POA: Diagnosis present

## 2021-03-24 DIAGNOSIS — R7401 Elevation of levels of liver transaminase levels: Secondary | ICD-10-CM | POA: Diagnosis present

## 2021-03-24 DIAGNOSIS — F39 Unspecified mood [affective] disorder: Secondary | ICD-10-CM | POA: Diagnosis present

## 2021-03-24 DIAGNOSIS — K429 Umbilical hernia without obstruction or gangrene: Secondary | ICD-10-CM | POA: Diagnosis present

## 2021-03-24 DIAGNOSIS — A4151 Sepsis due to Escherichia coli [E. coli]: Principal | ICD-10-CM | POA: Diagnosis present

## 2021-03-24 DIAGNOSIS — Z8614 Personal history of Methicillin resistant Staphylococcus aureus infection: Secondary | ICD-10-CM

## 2021-03-24 DIAGNOSIS — G8929 Other chronic pain: Secondary | ICD-10-CM | POA: Diagnosis present

## 2021-03-24 DIAGNOSIS — Z7951 Long term (current) use of inhaled steroids: Secondary | ICD-10-CM

## 2021-03-24 DIAGNOSIS — Z9049 Acquired absence of other specified parts of digestive tract: Secondary | ICD-10-CM | POA: Diagnosis not present

## 2021-03-24 DIAGNOSIS — Z90722 Acquired absence of ovaries, bilateral: Secondary | ICD-10-CM

## 2021-03-24 DIAGNOSIS — Z833 Family history of diabetes mellitus: Secondary | ICD-10-CM | POA: Diagnosis not present

## 2021-03-24 DIAGNOSIS — F32A Depression, unspecified: Secondary | ICD-10-CM | POA: Diagnosis present

## 2021-03-24 DIAGNOSIS — N136 Pyonephrosis: Secondary | ICD-10-CM | POA: Diagnosis present

## 2021-03-24 DIAGNOSIS — K37 Unspecified appendicitis: Secondary | ICD-10-CM | POA: Diagnosis present

## 2021-03-24 DIAGNOSIS — R000 Tachycardia, unspecified: Secondary | ICD-10-CM | POA: Diagnosis present

## 2021-03-24 DIAGNOSIS — Z87410 Personal history of cervical dysplasia: Secondary | ICD-10-CM | POA: Diagnosis not present

## 2021-03-24 DIAGNOSIS — I700 Atherosclerosis of aorta: Secondary | ICD-10-CM | POA: Diagnosis present

## 2021-03-24 DIAGNOSIS — N179 Acute kidney failure, unspecified: Secondary | ICD-10-CM | POA: Diagnosis present

## 2021-03-24 DIAGNOSIS — K861 Other chronic pancreatitis: Secondary | ICD-10-CM | POA: Diagnosis present

## 2021-03-24 DIAGNOSIS — R519 Headache, unspecified: Secondary | ICD-10-CM

## 2021-03-24 DIAGNOSIS — Z7901 Long term (current) use of anticoagulants: Secondary | ICD-10-CM

## 2021-03-24 DIAGNOSIS — Z86718 Personal history of other venous thrombosis and embolism: Secondary | ICD-10-CM | POA: Diagnosis not present

## 2021-03-24 DIAGNOSIS — K859 Acute pancreatitis without necrosis or infection, unspecified: Secondary | ICD-10-CM | POA: Diagnosis present

## 2021-03-24 DIAGNOSIS — Z79899 Other long term (current) drug therapy: Secondary | ICD-10-CM

## 2021-03-24 DIAGNOSIS — G43909 Migraine, unspecified, not intractable, without status migrainosus: Secondary | ICD-10-CM | POA: Diagnosis present

## 2021-03-24 DIAGNOSIS — F1120 Opioid dependence, uncomplicated: Secondary | ICD-10-CM | POA: Diagnosis present

## 2021-03-24 DIAGNOSIS — E876 Hypokalemia: Secondary | ICD-10-CM | POA: Diagnosis present

## 2021-03-24 DIAGNOSIS — Z87891 Personal history of nicotine dependence: Secondary | ICD-10-CM

## 2021-03-24 LAB — URINALYSIS, ROUTINE W REFLEX MICROSCOPIC
Bacteria, UA: NONE SEEN
Bilirubin Urine: NEGATIVE
Glucose, UA: NEGATIVE mg/dL
Ketones, ur: NEGATIVE mg/dL
Nitrite: POSITIVE — AB
Protein, ur: 100 mg/dL — AB
Specific Gravity, Urine: 1.013 (ref 1.005–1.030)
WBC, UA: >50 WBC/hpf — ABNORMAL HIGH (ref 0–5)
pH: 5 (ref 5.0–8.0)

## 2021-03-24 LAB — CBC
HCT: 44 % (ref 36.0–46.0)
Hemoglobin: 14.4 g/dL (ref 12.0–15.0)
MCH: 28.3 pg (ref 26.0–34.0)
MCHC: 32.7 g/dL (ref 30.0–36.0)
MCV: 86.4 fL (ref 80.0–100.0)
Platelets: 300 K/uL (ref 150–400)
RBC: 5.09 MIL/uL (ref 3.87–5.11)
RDW: 13.2 % (ref 11.5–15.5)
WBC: 18.9 K/uL — ABNORMAL HIGH (ref 4.0–10.5)
nRBC: 0 % (ref 0.0–0.2)

## 2021-03-24 LAB — COMPREHENSIVE METABOLIC PANEL WITH GFR
ALT: 79 U/L — ABNORMAL HIGH (ref 0–44)
AST: 56 U/L — ABNORMAL HIGH (ref 15–41)
Albumin: 3.8 g/dL (ref 3.5–5.0)
Alkaline Phosphatase: 288 U/L — ABNORMAL HIGH (ref 38–126)
Anion gap: 13 (ref 5–15)
BUN: 12 mg/dL (ref 6–20)
CO2: 26 mmol/L (ref 22–32)
Calcium: 9.4 mg/dL (ref 8.9–10.3)
Chloride: 100 mmol/L (ref 98–111)
Creatinine, Ser: 1.13 mg/dL — ABNORMAL HIGH (ref 0.44–1.00)
GFR, Estimated: >60 mL/min (ref >60)
Glucose, Bld: 125 mg/dL — ABNORMAL HIGH (ref 70–99)
Potassium: 3.7 mmol/L (ref 3.5–5.1)
Sodium: 139 mmol/L (ref 135–145)
Total Bilirubin: 2.1 mg/dL — ABNORMAL HIGH (ref 0.3–1.2)
Total Protein: 8.4 g/dL — ABNORMAL HIGH (ref 6.5–8.1)

## 2021-03-24 LAB — RESP PANEL BY RT-PCR (FLU A&B, COVID) ARPGX2
Influenza A by PCR: NEGATIVE
Influenza B by PCR: NEGATIVE
SARS Coronavirus 2 by RT PCR: NEGATIVE

## 2021-03-24 LAB — I-STAT BETA HCG BLOOD, ED (MC, WL, AP ONLY): I-stat hCG, quantitative: <5 m[IU]/mL (ref <5)

## 2021-03-24 LAB — LIPASE, BLOOD: Lipase: 26 U/L (ref 11–51)

## 2021-03-24 MED ORDER — POLYETHYLENE GLYCOL 3350 17 GM/SCOOP PO POWD: 17.0000 g | Freq: Two times a day (BID) | ORAL | Status: DC | PRN

## 2021-03-24 MED ORDER — SUCRALFATE 1 G PO TABS
1.0000 g | ORAL_TABLET | Freq: Two times a day (BID) | ORAL | Status: DC
  Administered 2021-03-24 – 2021-03-26 (×4): 1 g via ORAL
  Filled 2021-03-24 (×4): qty 1

## 2021-03-24 MED ORDER — POLYETHYLENE GLYCOL 3350 17 G PO PACK: 17.0000 g | PACK | Freq: Two times a day (BID) | ORAL | Status: DC | PRN

## 2021-03-24 MED ORDER — PANCRELIPASE (LIP-PROT-AMYL) 12000-38000 UNITS PO CPEP
72000.0000 [IU] | ORAL_CAPSULE | Freq: Every day | ORAL | Status: DC
  Administered 2021-03-25 – 2021-03-26 (×2): 72000 [IU] via ORAL
  Filled 2021-03-24: qty 2
  Filled 2021-03-24 (×2): qty 6

## 2021-03-24 MED ORDER — METHOCARBAMOL 1000 MG/10ML IJ SOLN
500.0000 mg | Freq: Four times a day (QID) | INTRAVENOUS | Status: DC | PRN
  Filled 2021-03-24: qty 5

## 2021-03-24 MED ORDER — SODIUM CHLORIDE (PF) 0.9 % IJ SOLN
INTRAMUSCULAR | Status: AC
  Filled 2021-03-24: qty 50

## 2021-03-24 MED ORDER — VALACYCLOVIR HCL 500 MG PO TABS: 500.0000 mg | ORAL_TABLET | ORAL | Status: DC | PRN

## 2021-03-24 MED ORDER — IOHEXOL 300 MG/ML  SOLN
100.0000 mL | Freq: Once | INTRAMUSCULAR | Status: AC | PRN
  Administered 2021-03-24: 100 mL via INTRAVENOUS

## 2021-03-24 MED ORDER — METOPROLOL TARTRATE 5 MG/5ML IV SOLN: 5.0000 mg | Freq: Four times a day (QID) | INTRAVENOUS | Status: DC | PRN

## 2021-03-24 MED ORDER — DULOXETINE HCL 60 MG PO CPEP
60.0000 mg | ORAL_CAPSULE | Freq: Every day | ORAL | Status: DC
  Administered 2021-03-24 – 2021-03-27 (×4): 60 mg via ORAL
  Filled 2021-03-24 (×2): qty 1
  Filled 2021-03-24: qty 2
  Filled 2021-03-24: qty 1

## 2021-03-24 MED ORDER — BUSPIRONE HCL 5 MG PO TABS
15.0000 mg | ORAL_TABLET | Freq: Three times a day (TID) | ORAL | Status: DC
  Administered 2021-03-24 – 2021-03-27 (×8): 15 mg via ORAL
  Filled 2021-03-24 (×8): qty 3

## 2021-03-24 MED ORDER — BUPRENORPHINE HCL-NALOXONE HCL 8-2 MG SL SUBL
1.0000 | SUBLINGUAL_TABLET | Freq: Every day | SUBLINGUAL | Status: DC
  Administered 2021-03-25: 1 via SUBLINGUAL
  Filled 2021-03-24: qty 1

## 2021-03-24 MED ORDER — MOMETASONE FURO-FORMOTEROL FUM 100-5 MCG/ACT IN AERO
2.0000 | INHALATION_SPRAY | Freq: Two times a day (BID) | RESPIRATORY_TRACT | Status: DC
  Administered 2021-03-25 – 2021-03-27 (×5): 2 via RESPIRATORY_TRACT
  Filled 2021-03-24 (×2): qty 8.8

## 2021-03-24 MED ORDER — IBUPROFEN 200 MG PO TABS
400.0000 mg | ORAL_TABLET | Freq: Four times a day (QID) | ORAL | Status: DC | PRN
  Administered 2021-03-25 – 2021-03-27 (×4): 400 mg via ORAL
  Filled 2021-03-24 (×4): qty 2

## 2021-03-24 MED ORDER — ONDANSETRON HCL 4 MG PO TABS
4.0000 mg | ORAL_TABLET | Freq: Four times a day (QID) | ORAL | Status: DC | PRN
  Administered 2021-03-26 – 2021-03-27 (×2): 4 mg via ORAL
  Filled 2021-03-24 (×2): qty 1

## 2021-03-24 MED ORDER — MORPHINE SULFATE (PF) 2 MG/ML IV SOLN
2.0000 mg | INTRAVENOUS | Status: DC | PRN
  Administered 2021-03-24 – 2021-03-25 (×6): 2 mg via INTRAVENOUS
  Filled 2021-03-24 (×6): qty 1

## 2021-03-24 MED ORDER — SODIUM CHLORIDE 0.9 % IV SOLN: 1.0000 g | Freq: Once | INTRAVENOUS | Status: DC

## 2021-03-24 MED ORDER — LACTATED RINGERS IV BOLUS
1000.0000 mL | Freq: Once | INTRAVENOUS | Status: AC
  Administered 2021-03-24: 1000 mL via INTRAVENOUS

## 2021-03-24 MED ORDER — APIXABAN 5 MG PO TABS
5.0000 mg | ORAL_TABLET | Freq: Two times a day (BID) | ORAL | Status: DC
  Administered 2021-03-24 – 2021-03-27 (×6): 5 mg via ORAL
  Filled 2021-03-24 (×7): qty 1

## 2021-03-24 MED ORDER — LACTATED RINGERS IV SOLN: INTRAVENOUS | Status: DC

## 2021-03-24 MED ORDER — BUPRENORPHINE HCL-NALOXONE HCL 8-2 MG SL FILM: 1.0000 | ORAL_FILM | Freq: Every day | SUBLINGUAL | Status: DC

## 2021-03-24 MED ORDER — SCOPOLAMINE 1 MG/3DAYS TD PT72
1.0000 | MEDICATED_PATCH | TRANSDERMAL | Status: DC
  Administered 2021-03-24: 1.5 mg via TRANSDERMAL
  Filled 2021-03-24: qty 1

## 2021-03-24 MED ORDER — METOCLOPRAMIDE HCL 5 MG/ML IJ SOLN
5.0000 mg | Freq: Once | INTRAMUSCULAR | Status: AC
  Administered 2021-03-24: 5 mg via INTRAVENOUS
  Filled 2021-03-24: qty 2

## 2021-03-24 MED ORDER — FENTANYL CITRATE (PF) 100 MCG/2ML IJ SOLN
75.0000 ug | Freq: Once | INTRAMUSCULAR | Status: AC
  Administered 2021-03-24: 75 ug via INTRAVENOUS
  Filled 2021-03-24: qty 2

## 2021-03-24 MED ORDER — QUETIAPINE FUMARATE ER 200 MG PO TB24
400.0000 mg | ORAL_TABLET | Freq: Every day | ORAL | Status: DC
  Administered 2021-03-24 – 2021-03-26 (×3): 400 mg via ORAL
  Filled 2021-03-24 (×3): qty 2

## 2021-03-24 MED ORDER — QUETIAPINE FUMARATE 25 MG PO TABS: 25.0000 mg | ORAL_TABLET | Freq: Four times a day (QID) | ORAL | Status: DC | PRN

## 2021-03-24 MED ORDER — COLESTIPOL HCL 1 G PO TABS
1.0000 g | ORAL_TABLET | Freq: Two times a day (BID) | ORAL | Status: DC
  Administered 2021-03-25 – 2021-03-26 (×2): 1 g via ORAL
  Filled 2021-03-24 (×6): qty 1

## 2021-03-24 MED ORDER — MONTELUKAST SODIUM 10 MG PO TABS
10.0000 mg | ORAL_TABLET | Freq: Every day | ORAL | Status: DC
  Administered 2021-03-25 – 2021-03-27 (×3): 10 mg via ORAL
  Filled 2021-03-24 (×3): qty 1

## 2021-03-24 MED ORDER — HYDROMORPHONE HCL 1 MG/ML IJ SOLN
1.0000 mg | Freq: Once | INTRAMUSCULAR | Status: AC
  Administered 2021-03-24: 1 mg via INTRAVENOUS
  Filled 2021-03-24: qty 1

## 2021-03-24 MED ORDER — SODIUM CHLORIDE 0.9 % IV SOLN
8.0000 mg | Freq: Four times a day (QID) | INTRAVENOUS | Status: DC | PRN
  Administered 2021-03-24: 8 mg via INTRAVENOUS
  Filled 2021-03-24 (×2): qty 4

## 2021-03-24 MED ORDER — MIRTAZAPINE 15 MG PO TABS
15.0000 mg | ORAL_TABLET | Freq: Every day | ORAL | Status: DC
  Administered 2021-03-24 – 2021-03-26 (×3): 15 mg via ORAL
  Filled 2021-03-24 (×3): qty 1
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
## 2021-03-24 NOTE — ED Notes (Signed)
Pt ambulated to bathroom with no assistance.  

## 2021-03-24 NOTE — ED Provider Notes (Signed)
PleasePatient signed out to me by previous provider, refer to their note for full HPI.  Brief this is a 45 year old female with past medical history of chronic pancreatitis with pseudocyst and other complications who presented to the emergency department with worsening right-sided abdominal pain.  Work-up thus far is significant for leukocytosis and acute on chronic transaminitis/elevated bilirubin.  Has been unable to p.o., requiring multiple rounds of pain medicine/nausea medicine. Physical Exam  BP 124/70   Pulse 100   Temp 99.4 F (37.4 C) (Oral)   Resp 18   SpO2 95%   Physical Exam Vitals and nursing note reviewed.  Constitutional:      Appearance: Normal appearance.  HENT:     Head: Normocephalic.     Mouth/Throat:     Mouth: Mucous membranes are moist.  Cardiovascular:     Rate and Rhythm: Normal rate.  Pulmonary:     Effort: Pulmonary effort is normal. No respiratory distress.  Abdominal:     Palpations: Abdomen is soft.     Tenderness: There is abdominal tenderness in the right upper quadrant, right lower quadrant and periumbilical area. There is right CVA tenderness and guarding.  Skin:    General: Skin is warm.  Neurological:     Mental Status: She is alert and oriented to person, place, and time. Mental status is at baseline.  Psychiatric:        Mood and Affect: Mood normal.    ED Course/Procedures     Procedures  MDM   Urinalysis shows urinary tract infection.  CAT scan shows stable findings in regards to the pancreas but does show cystitis with right pyelonephritis.  On my reevaluation patient continues to be very tender, unable to tolerate p.o.  We will plan for IV antibiotics, urine culture and admission.  Patients evaluation and results requires admission for further treatment and care. Patient agrees with admission plan, offers no new complaints and is stable/unchanged at time of admit.       Rozelle Logan, DO 03/24/21 1720

## 2021-03-24 NOTE — Plan of Care (Signed)
Patient educated on new medication

## 2021-03-24 NOTE — ED Triage Notes (Signed)
Pt reports being admitted for acute pancreatitis back in June. Pt reports increased abdominal pain and hematuria over the past few days.

## 2021-03-24 NOTE — ED Provider Notes (Signed)
Emergency Medicine Provider Triage Evaluation Note  Tricia Brown , a 45 y.o. female  was evaluated in triage.  Pt complains of abdominal pain and hematuria.  She was discharged from an admission for pancreatitis on 03/15/2021.  States that she felt better at home for about 2 days and the pain started.  It is in her epigastric area and now also on the right side of the abdomen.  Reports nausea, vomiting and some hematuria.  Unable to control pain at home.  Review of Systems  Positive: Abdominal pain, vomiting, hematuria Negative: Fever  Physical Exam  BP 112/70 (BP Location: Right Arm)   Pulse (!) 145   Temp 99.4 F (37.4 C) (Oral)   Resp 18   SpO2 95%  Gen:   Awake, no distress   Resp:  Normal effort  MSK:   Moves extremities without difficulty  Other:  Tachycardic.  Epigastric and right-sided tenderness  Medical Decision Making  Medically screening exam initiated at 1:43 PM.  Appropriate orders placed.  Tricia Brown was informed that the remainder of the evaluation will be completed by another provider, this initial triage assessment does not replace that evaluation, and the importance of remaining in the ED until their evaluation is complete.  Patient will be room soon   Tricia Brown, Tricia Brown 03/24/21 1345    Tricia Sprout, MD 03/26/21 2224

## 2021-03-24 NOTE — ED Notes (Signed)
Called [phone] to acquire purple man to complete IP handoff

## 2021-03-24 NOTE — ED Notes (Signed)
Labeled specimen cup provided to pt for urine collection per MD order. ENMiles 

## 2021-03-24 NOTE — ED Notes (Signed)
Patient transported to CT 
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
## 2021-03-25 DIAGNOSIS — N12 Tubulo-interstitial nephritis, not specified as acute or chronic: Secondary | ICD-10-CM

## 2021-03-25 LAB — CBC
HCT: 35.9 % — ABNORMAL LOW (ref 36.0–46.0)
Hemoglobin: 11.6 g/dL — ABNORMAL LOW (ref 12.0–15.0)
MCH: 28.7 pg (ref 26.0–34.0)
MCHC: 32.3 g/dL (ref 30.0–36.0)
MCV: 88.9 fL (ref 80.0–100.0)
Platelets: 215 10*3/uL (ref 150–400)
RBC: 4.04 MIL/uL (ref 3.87–5.11)
RDW: 13.3 % (ref 11.5–15.5)
WBC: 13.8 10*3/uL — ABNORMAL HIGH (ref 4.0–10.5)
nRBC: 0 % (ref 0.0–0.2)

## 2021-03-25 LAB — COMPREHENSIVE METABOLIC PANEL
ALT: 56 U/L — ABNORMAL HIGH (ref 0–44)
AST: 34 U/L (ref 15–41)
Albumin: 3 g/dL — ABNORMAL LOW (ref 3.5–5.0)
Alkaline Phosphatase: 247 U/L — ABNORMAL HIGH (ref 38–126)
Anion gap: 8 (ref 5–15)
BUN: 12 mg/dL (ref 6–20)
CO2: 29 mmol/L (ref 22–32)
Calcium: 8.7 mg/dL — ABNORMAL LOW (ref 8.9–10.3)
Chloride: 101 mmol/L (ref 98–111)
Creatinine, Ser: 0.87 mg/dL (ref 0.44–1.00)
GFR, Estimated: >60 mL/min (ref >60)
Glucose, Bld: 125 mg/dL — ABNORMAL HIGH (ref 70–99)
Potassium: 3.9 mmol/L (ref 3.5–5.1)
Sodium: 138 mmol/L (ref 135–145)
Total Bilirubin: 1.6 mg/dL — ABNORMAL HIGH (ref 0.3–1.2)
Total Protein: 6.7 g/dL (ref 6.5–8.1)

## 2021-03-25 MED ORDER — OXYCODONE HCL 5 MG PO TABS
5.0000 mg | ORAL_TABLET | ORAL | Status: DC | PRN
  Administered 2021-03-25 – 2021-03-27 (×10): 5 mg via ORAL
  Filled 2021-03-25 (×10): qty 1

## 2021-03-25 MED ORDER — ALBUTEROL SULFATE HFA 108 (90 BASE) MCG/ACT IN AERS: 2.0000 | INHALATION_SPRAY | Freq: Four times a day (QID) | RESPIRATORY_TRACT | Status: DC | PRN

## 2021-03-25 MED ORDER — LACTATED RINGERS IV SOLN: INTRAVENOUS | Status: AC

## 2021-03-25 MED ORDER — ACETAMINOPHEN 325 MG PO TABS
650.0000 mg | ORAL_TABLET | Freq: Four times a day (QID) | ORAL | Status: DC | PRN
  Administered 2021-03-25 – 2021-03-26 (×3): 650 mg via ORAL
  Filled 2021-03-25 (×3): qty 2

## 2021-03-25 MED ORDER — SODIUM CHLORIDE 0.9 % IV SOLN
2.0000 g | INTRAVENOUS | Status: DC
  Administered 2021-03-25 – 2021-03-27 (×3): 2 g via INTRAVENOUS
  Filled 2021-03-25 (×4): qty 20

## 2021-03-25 MED ORDER — GABAPENTIN 300 MG PO CAPS
900.0000 mg | ORAL_CAPSULE | Freq: Four times a day (QID) | ORAL | Status: DC
  Administered 2021-03-25 – 2021-03-27 (×9): 900 mg via ORAL
  Filled 2021-03-25 (×9): qty 3

## 2021-03-25 MED ORDER — BUPRENORPHINE HCL-NALOXONE HCL 8-2 MG SL SUBL
1.0000 | SUBLINGUAL_TABLET | Freq: Two times a day (BID) | SUBLINGUAL | Status: DC
  Administered 2021-03-25 – 2021-03-27 (×4): 1 via SUBLINGUAL
  Filled 2021-03-25 (×4): qty 1

## 2021-03-25 NOTE — Progress Notes (Signed)
PROGRESS NOTE    Tricia Brown  BLT:[id number] DOB: 02/04/1976 DOA: 03/24/2021 PCP: [name], MD   Brief Narrative:  45 year old with past medical history of pancreatitis, opiate use on Suboxone recently had pancreatitis discharged on March 15, 2021.  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  Also had sepsis secondary to Enterococcus UTI, DVT of right cephalic vein on Eliquis.  Not reporting of hematuria, dysuria.  CT showed right-sided pyelonephritis and was started on IV Rocephin and admitted to the hospital.   Assessment & Plan:   Principal Problem:   Pyelonephritis Active Problems:   Opioid use disorder, severe, dependence (HCC)   Pancreatitis   AKI (acute kidney injury) (HCC)   Transaminitis   Sepsis secondary to acute pyelonephritis - Sepsis evidenced by tachycardia, leukocytosis and source.  Sepsis physiology slowly improving - Follow urine culture - IV Rocephin, IV fluids - Pain control with bowel regimen  Acute kidney injury, POA.  Resolved - Admission creatinine 1.13.  Baseline 0.8.  Getting IV hydration  History of opioid use - On Suboxone  Recent history of complicated pancreatitis - Stable for now.  Continue to monitor  Mild transaminitis - Closely monitor  Recent history of DVT and cephalic vein, diagnosed March 2022 - Continue Eliquis  Mood disorder -Continue bedtime Seroquel and Remeron   DVT prophylaxis: Eliquis Code Status: Full code Family Communication:    Status is: Inpatient  Remains inpatient appropriate because:Inpatient level of care appropriate due to severity of illness  Dispo: The patient is from: Home              Anticipated d/c is to: Home              Patient currently is not medically stable to d/c.   Difficult to place patient No      Subjective: Patient states she feels little better this morning after getting IV  fluids and antibiotics.  Review of Systems Otherwise negative except as per HPI, including: General: Denies fever, chills, night sweats or unintended weight loss. Resp: Denies cough, wheezing, shortness of breath. Cardiac: Denies chest pain, palpitations, orthopnea, paroxysmal nocturnal dyspnea. GI: Denies abdominal pain, nausea, vomiting, diarrhea or constipation GU: Denies dysuria, frequency, hesitancy or incontinence MS: Denies muscle aches, joint pain or swelling Neuro: Denies headache, neurologic deficits (focal weakness, numbness, tingling), abnormal gait Psych: Denies anxiety, depression, SI/HI/AVH Skin: Denies new rashes or lesions ID: Denies sick contacts, exotic exposures, travel  Examination:  General exam: Appears calm and comfortable  Respiratory system: Clear to auscultation. Respiratory effort normal. Cardiovascular system: S1 & S2 heard, RRR. No JVD, murmurs, rubs, gallops or clicks. No pedal edema. Gastrointestinal system: Abdomen is nondistended, soft and nontender. No organomegaly or masses felt. Normal bowel sounds heard. Central nervous system: Alert and oriented. No focal neurological deficits. Extremities: Symmetric 5 x 5 power. Skin: No rashes, lesions or ulcers Psychiatry: Judgement and insight appear normal. Mood & affect appropriate.     Objective: Vitals:   03/24/21 1949 03/24/21 2120 03/24/21 2123 03/25/21 0400  BP: 104/62 109/69 109/69 127/74  Pulse: (!) 115 (!) 105 (!) 105 95  Resp: 18 20 20 18   Temp:  98.6 F (37 C) 98.6 F (37 C) 98.4 F (36.9 C)  TempSrc:  Oral Oral Oral  SpO2: 94%  95% 98%  Weight:  82 kg    Height:  5\' 7"  (1.702 m)      Intake/Output Summary (  Last 24 hours) at 03/25/2021 0727 Last data filed at 03/25/2021 0600 Gross per 24 hour  Intake 1919 ml  Output 400 ml  Net 1519 ml   Filed Weights   03/24/21 2120  Weight: 82 kg     Data Reviewed:   CBC: Recent Labs  Lab 03/24/21 1358 03/25/21 0400  WBC 18.9* 13.8*   HGB 14.4 11.6*  HCT 44.0 35.9*  MCV 86.4 88.9  PLT 300 215   Basic Metabolic Panel: Recent Labs  Lab 03/24/21 1358 03/25/21 0400  NA 139 138  K 3.7 3.9  CL 100 101  CO2 26 29  GLUCOSE 125* 125*  BUN 12 12  CREATININE 1.13* 0.87  CALCIUM 9.4 8.7*   GFR: Estimated Creatinine Clearance: 90 mL/min (by C-G formula based on SCr of 0.87 mg/dL). Liver Function Tests: Recent Labs  Lab 03/24/21 1358 03/25/21 0400  AST 56* 34  ALT 79* 56*  ALKPHOS 288* 247*  BILITOT 2.1* 1.6*  PROT 8.4* 6.7  ALBUMIN 3.8 3.0*   Recent Labs  Lab 03/24/21 1358  LIPASE 26   No results for input(s): AMMONIA in the last 168 hours. Coagulation Profile: No results for input(s): INR, PROTIME in the last 168 hours. Cardiac Enzymes: No results for input(s): CKTOTAL, CKMB, CKMBINDEX, TROPONINI in the last 168 hours. BNP (last 3 results) No results for input(s): PROBNP in the last 8760 hours. HbA1C: No results for input(s): HGBA1C in the last 72 hours. CBG: No results for input(s): GLUCAP in the last 168 hours. Lipid Profile: No results for input(s): CHOL, HDL, LDLCALC, TRIG, CHOLHDL, LDLDIRECT in the last 72 hours. Thyroid Function Tests: No results for input(s): TSH, T4TOTAL, FREET4, T3FREE, THYROIDAB in the last 72 hours. Anemia Panel: No results for input(s): VITAMINB12, FOLATE, FERRITIN, TIBC, IRON, RETICCTPCT in the last 72 hours. Sepsis Labs: No results for input(s): PROCALCITON, LATICACIDVEN in the last 168 hours.  Recent Results (from the past 240 hour(s))  Resp Panel by RT-PCR (Flu A&B, Covid) Nasopharyngeal Swab     Status: None   Collection Time: 03/24/21  7:04 PM   Specimen: Nasopharyngeal Swab; Nasopharyngeal(NP) swabs in vial transport medium  Result Value Ref Range Status   SARS Coronavirus 2 by RT PCR NEGATIVE NEGATIVE Final    Comment: (NOTE) SARS-CoV-2 target nucleic acids are NOT DETECTED.  The SARS-CoV-2 RNA is generally detectable in upper respiratory specimens  during the acute phase of infection. The lowest concentration of SARS-CoV-2 viral copies this assay can detect is 138 copies/mL. A negative result does not preclude SARS-Cov-2 infection and should not be used as the sole basis for treatment or other patient management decisions. A negative result may occur with  improper specimen collection/handling, submission of specimen other than nasopharyngeal swab, presence of viral mutation(s) within the areas targeted by this assay, and inadequate number of viral copies(<138 copies/mL). A negative result must be combined with clinical observations, patient history, and epidemiological information. The expected result is Negative.  Fact Sheet for Patients:  BloggerCourse.com  Fact Sheet for Healthcare Providers:  SeriousBroker.it  This test is no t yet approved or cleared by the [place] FDA and  has been authorized for detection and/or diagnosis of SARS-CoV-2 by FDA under an Emergency Use Authorization (EUA). This EUA will remain  in effect (meaning this test can be used) for the duration of the COVID-19 declaration under Section 564(b)(1) of the Act, 21 U.S.C.section 360bbb-3(b)(1), unless the authorization is terminated  or revoked sooner.  Influenza A by PCR NEGATIVE NEGATIVE Final   Influenza B by PCR NEGATIVE NEGATIVE Final    Comment: (NOTE) The Xpert Xpress SARS-CoV-2/FLU/RSV plus assay is intended as an aid in the diagnosis of influenza from Nasopharyngeal swab specimens and should not be used as a sole basis for treatment. Nasal washings and aspirates are unacceptable for Xpert Xpress SARS-CoV-2/FLU/RSV testing.  Fact Sheet for Patients: BloggerCourse.comhttps://www.fda.gov/media/152166/download  Fact Sheet for Healthcare Providers: SeriousBroker.ithttps://www.fda.gov/media/152162/download  This test is not yet approved or cleared by the [place] FDA and has been authorized for detection  and/or diagnosis of SARS-CoV-2 by FDA under an Emergency Use Authorization (EUA). This EUA will remain in effect (meaning this test can be used) for the duration of the COVID-19 declaration under Section 564(b)(1) of the Act, 21 U.S.C. section 360bbb-3(b)(1), unless the authorization is terminated or revoked.  Performed at [facility], [address] [place], [place] [address]          Radiology Studies: CT Abdomen Pelvis W Contrast  Result Date: 03/24/2021 CLINICAL DATA:  Abdominal abscess/infection suspected Worsening chronic abdominal pain, recent pancreatitis. EXAM: CT ABDOMEN AND PELVIS WITH CONTRAST TECHNIQUE: Multidetector CT imaging of the abdomen and pelvis was performed using the standard protocol following bolus administration of intravenous contrast. CONTRAST:  100mL OMNIPAQUE IOHEXOL 300 MG/ML  SOLN COMPARISON:  Most recent CT 03/09/2021.  Most recent MRI 03/14/2021 FINDINGS: Lower chest: Bilateral lower lobe atelectasis. Trace pleural thickening without significant effusion. Hepatobiliary: Mild hepatomegaly with liver spanning 21.3 cm cranial caudal. No focal liver abnormality is seen. Cholecystectomy without biliary dilatation. Pancreas: Mild indistinctness of the peripancreatic fat about the distal pancreatic tail series 2, image 24. No progressive peripancreatic inflammation. Stable fluid collection abutting the pancreatic tail measuring 2.3 x 1.6 cm, series 2, image 22. This abuts the posterior stomach as before. Pancreatic ductal communication on prior MRI is not well characterized on the current exam. No new peripancreatic collection. Spleen: Upper normal in size spanning 13.2 cm AP. Splenule again seen at the hilum. No focal splenic abnormality. Adrenals/Urinary Tract: Normal adrenal glands. Mild right hydronephrosis with heterogeneous right renal enhancement. Moderate right perinephric edema with right retroperitoneal stranding. There is diffuse right  ureteral thickening and enhancement. No visualized renal ureteral stone. No intrarenal or perirenal fluid collection. Urinary bladder is partially distended, however demonstrates wall thickening, perivesicular fat stranding, and hyperemia. There is diminished excretion from the right kidney on delayed phase imaging. Homogeneous left renal enhancement. No left hydronephrosis or inflammation. Stomach/Bowel: Normal appendix, containing enteric contrast. No appendicitis. Decompressed stomach. Minimally fluid-filled duodenum adjacent to the right kidney, likely reactive. Small bowel is otherwise unremarkable. Moderate colonic stool burden. Sigmoid colon is redundant. Vascular/Lymphatic: Normal caliber abdominal aorta. Patent portal vein. Attenuated splenic vein again seen with left upper quadrant collaterals. Small retroperitoneal lymph nodes are likely reactive. Reproductive: Hysterectomy without adnexal mass. Other: Right retroperitoneal fat stranding with free fluid in the right pericolic gutter tracking into the right lower quadrant and pelvis. No free air. Small fat containing umbilical hernia. Musculoskeletal: No acute findings. IMPRESSION: 1. Findings consistent with cystitis and right pyelonephritis. No intrarenal or perirenal fluid collection. Right retroperitoneal fat stranding with free fluid in the right pericolic gutter tracking into the right lower quadrant and pelvis. 2. Stable fluid collection abutting the pancreatic tail, likely pseudocyst. Mild indistinctness of the peripancreatic fat adjacent to the distal pancreas without definite acute peripancreatic inflammation. 3. Mild hepatomegaly.  Borderline splenomegaly. 4. Chronic splenic vein occlusion. Electronically Signed  By: Narda Rutherford M.D.   On: 03/24/2021 15:53        Scheduled Meds:  apixaban  5 mg Oral BID   buprenorphine-naloxone  1 tablet Sublingual Daily   busPIRone  15 mg Oral TID   colestipol  1 g Oral BID   DULoxetine  60 mg  Oral Daily   lipase/protease/amylase  72,000 Units Oral Daily   mirtazapine  15 mg Oral QHS   mometasone-formoterol  2 puff Inhalation BID   montelukast  10 mg Oral Daily   QUEtiapine  400 mg Oral QHS   scopolamine  1 patch Transdermal Q72H   sucralfate  1 g Oral BID   Continuous Infusions:  lactated ringers 125 mL/hr at 03/24/21 1829   methocarbamol (ROBAXIN) IV     ondansetron (ZOFRAN) IV 8 mg (03/24/21 1900)     LOS: 1 day   Time spent= 35 mins    Tahjay Binion Joline Maxcy, MD [facility]  If 7PM-7AM, please contact night-coverage  03/25/2021, 7:27 AM

## 2021-03-26 ENCOUNTER — Encounter (HOSPITAL_COMMUNITY): Payer: Self-pay | Admitting: Family Medicine

## 2021-03-26 ENCOUNTER — Inpatient Hospital Stay (HOSPITAL_COMMUNITY): Payer: Medicaid Other

## 2021-03-26 MED ORDER — LACTATED RINGERS IV SOLN: INTRAVENOUS | Status: AC

## 2021-03-26 MED ORDER — SUMATRIPTAN SUCCINATE 50 MG PO TABS
50.0000 mg | ORAL_TABLET | Freq: Once | ORAL | Status: AC
  Administered 2021-03-26: 50 mg via ORAL
  Filled 2021-03-26: qty 1

## 2021-03-26 MED ORDER — PANCRELIPASE (LIP-PROT-AMYL) 12000-38000 UNITS PO CPEP
72000.0000 [IU] | ORAL_CAPSULE | Freq: Three times a day (TID) | ORAL | Status: DC
  Administered 2021-03-26 – 2021-03-27 (×3): 72000 [IU] via ORAL
  Filled 2021-03-26 (×3): qty 6

## 2021-03-26 MED ORDER — SUCRALFATE 1 G PO TABS
1.0000 g | ORAL_TABLET | Freq: Three times a day (TID) | ORAL | Status: DC
  Administered 2021-03-26 – 2021-03-27 (×4): 1 g via ORAL
  Filled 2021-03-26 (×4): qty 1

## 2021-03-26 NOTE — Progress Notes (Signed)
PROGRESS NOTE    Tricia Brown  PNT:[id number] DOB: [birth year] DOA: 03/24/2021 PCP: [name], MD   Brief Narrative:  45 year old with past medical history of pancreatitis, opiate use on Suboxone recently had pancreatitis discharged on March 15, 2021.  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  Also had sepsis secondary to Enterococcus UTI, DVT of right cephalic vein on Eliquis.  Not reporting of hematuria, dysuria.  CT showed right-sided pyelonephritis and was started on IV Rocephin and admitted to the hospital.   Assessment & Plan:   Principal Problem:   Pyelonephritis Active Problems:   Opioid use disorder, severe, dependence (HCC)   Pancreatitis   AKI (acute kidney injury) (HCC)   Transaminitis   Sepsis secondary to acute pyelonephritis, E. coli - Sepsis evidenced by tachycardia, leukocytosis and source.  Sepsis physiology slowly improving -Urine cultures growing E. coli, susceptibility pending - Empiric IV Rocephin and fluids  Severe headache with nausea - Unknown exact etiology.  Migraine versus tension headache.  Denies any fall.  Will get CT head to rule out any acute pathology.  Pain medicines as necessary.  Acute kidney injury, POA.  Resolved - Admission creatinine 1.13.  Baseline 0.8.  Getting IV hydration  History of opioid use - On Suboxone  Recent history of complicated pancreatitis - Stable for now.  Continue to monitor  Mild transaminitis - Closely monitor  Recent history of DVT and cephalic vein, diagnosed March 2022 - Continue Eliquis  Mood disorder -Continue bedtime Seroquel and Remeron   DVT prophylaxis: Eliquis Code Status: Full code Family Communication:    Status is: Inpatient  Remains inpatient appropriate because:Inpatient level of care appropriate due to severity of illness  Dispo: The patient is from: Home              Anticipated  d/c is to: Home              Patient currently is not medically stable to d/c.   Difficult to place patient No      Subjective: Reporting of severe headache with mild nausea.  Describes the headache pain frontal going around some to her neck.  Reports she has had migraine in the past.  Review of Systems Otherwise negative except as per HPI, including: General: Denies fever, chills, night sweats or unintended weight loss. Resp: Denies cough, wheezing, shortness of breath. Cardiac: Denies chest pain, palpitations, orthopnea, paroxysmal nocturnal dyspnea. GI: Denies abdominal pain, nausea, vomiting, diarrhea or constipation GU: Denies dysuria, frequency, hesitancy or incontinence MS: Denies muscle aches, joint pain or swelling Neuro: Denies headache, neurologic deficits (focal weakness, numbness, tingling), abnormal gait Psych: Denies anxiety, depression, SI/HI/AVH Skin: Denies new rashes or lesions ID: Denies sick contacts, exotic exposures, travel  Examination:  Constitutional: Mild distress due to headache Respiratory: Clear to auscultation bilaterally Cardiovascular: Normal sinus rhythm, no rubs Abdomen: Nontender nondistended good bowel sounds Musculoskeletal: No edema noted Skin: No rashes seen Neurologic: CN 2-12 grossly intact.  And nonfocal Psychiatric: Normal judgment and insight. Alert and oriented x 3. Normal mood.   Objective: Vitals:   03/25/21 1935 03/25/21 2145 03/26/21 0429 03/26/21 0829  BP:  93/60 133/69 137/74  Pulse:  78 (!) 103 100  Resp:  20 18   Temp:  97.8 F (36.6 C) 97.6 F (36.4 C)   TempSrc:  Oral    SpO2: 95% 93% 100%   Weight:      Height:  Intake/Output Summary (Last 24 hours) at 03/26/2021 1044 Last data filed at 03/26/2021 0600 Gross per 24 hour  Intake 2657.48 ml  Output --  Net 2657.48 ml   Filed Weights   03/24/21 2120  Weight: 82 kg     Data Reviewed:   CBC: Recent Labs  Lab 03/24/21 1358 03/25/21 0400  WBC  18.9* 13.8*  HGB 14.4 11.6*  HCT 44.0 35.9*  MCV 86.4 88.9  PLT 300 215   Basic Metabolic Panel: Recent Labs  Lab 03/24/21 1358 03/25/21 0400  NA 139 138  K 3.7 3.9  CL 100 101  CO2 26 29  GLUCOSE 125* 125*  BUN 12 12  CREATININE 1.13* 0.87  CALCIUM 9.4 8.7*   GFR: Estimated Creatinine Clearance: 90 mL/min (by C-G formula based on SCr of 0.87 mg/dL). Liver Function Tests: Recent Labs  Lab 03/24/21 1358 03/25/21 0400  AST 56* 34  ALT 79* 56*  ALKPHOS 288* 247*  BILITOT 2.1* 1.6*  PROT 8.4* 6.7  ALBUMIN 3.8 3.0*   Recent Labs  Lab 03/24/21 1358  LIPASE 26   No results for input(s): AMMONIA in the last 168 hours. Coagulation Profile: No results for input(s): INR, PROTIME in the last 168 hours. Cardiac Enzymes: No results for input(s): CKTOTAL, CKMB, CKMBINDEX, TROPONINI in the last 168 hours. BNP (last 3 results) No results for input(s): PROBNP in the last 8760 hours. HbA1C: No results for input(s): HGBA1C in the last 72 hours. CBG: No results for input(s): GLUCAP in the last 168 hours. Lipid Profile: No results for input(s): CHOL, HDL, LDLCALC, TRIG, CHOLHDL, LDLDIRECT in the last 72 hours. Thyroid Function Tests: No results for input(s): TSH, T4TOTAL, FREET4, T3FREE, THYROIDAB in the last 72 hours. Anemia Panel: No results for input(s): VITAMINB12, FOLATE, FERRITIN, TIBC, IRON, RETICCTPCT in the last 72 hours. Sepsis Labs: No results for input(s): PROCALCITON, LATICACIDVEN in the last 168 hours.  Recent Results (from the past 240 hour(s))  Urine culture     Status: Abnormal (Preliminary result)   Collection Time: 03/24/21  5:14 PM   Specimen: Urine, Clean Catch  Result Value Ref Range Status   Specimen Description   Final    URINE, CLEAN CATCH Performed at [facility], [address] [place], [place] [address]    Special Requests   Final    NONE Performed at [facility], [address] [place], [place]  [address]    Culture (A)  Final    >=100,000 COLONIES/mL ESCHERICHIA COLI SUSCEPTIBILITIES TO FOLLOW Performed at [facility], [address] [place], [place] [address]    Report Status PENDING  Incomplete  Resp Panel by RT-PCR (Flu A&B, Covid) Nasopharyngeal Swab     Status: None   Collection Time: 03/24/21  7:04 PM   Specimen: Nasopharyngeal Swab; Nasopharyngeal(NP) swabs in vial transport medium  Result Value Ref Range Status   SARS Coronavirus 2 by RT PCR NEGATIVE NEGATIVE Final    Comment: (NOTE) SARS-CoV-2 target nucleic acids are NOT DETECTED.  The SARS-CoV-2 RNA is generally detectable in upper respiratory specimens during the acute phase of infection. The lowest concentration of SARS-CoV-2 viral copies this assay can detect is 138 copies/mL. A negative result does not preclude SARS-Cov-2 infection and should not be used as the sole basis for treatment or other patient management decisions. A negative result may occur with  improper specimen collection/handling, submission of specimen other than nasopharyngeal swab, presence of viral mutation(s) within the areas targeted  by this assay, and inadequate number of viral copies(<138 copies/mL). A negative result must be combined with clinical observations, patient history, and epidemiological information. The expected result is Negative.  Fact Sheet for Patients:  BloggerCourse.com  Fact Sheet for Healthcare Providers:  SeriousBroker.it  This test is no t yet approved or cleared by the [place] FDA and  has been authorized for detection and/or diagnosis of SARS-CoV-2 by FDA under an Emergency Use Authorization (EUA). This EUA will remain  in effect (meaning this test can be used) for the duration of the COVID-19 declaration under Section 564(b)(1) of the Act, 21 U.S.C.section 360bbb-3(b)(1), unless the authorization is terminated  or revoked sooner.        Influenza A by PCR NEGATIVE NEGATIVE Final   Influenza B by PCR NEGATIVE NEGATIVE Final    Comment: (NOTE) The Xpert Xpress SARS-CoV-2/FLU/RSV plus assay is intended as an aid in the diagnosis of influenza from Nasopharyngeal swab specimens and should not be used as a sole basis for treatment. Nasal washings and aspirates are unacceptable for Xpert Xpress SARS-CoV-2/FLU/RSV testing.  Fact Sheet for Patients: BloggerCourse.com  Fact Sheet for Healthcare Providers: SeriousBroker.it  This test is not yet approved or cleared by the [place] FDA and has been authorized for detection and/or diagnosis of SARS-CoV-2 by FDA under an Emergency Use Authorization (EUA). This EUA will remain in effect (meaning this test can be used) for the duration of the COVID-19 declaration under Section 564(b)(1) of the Act, 21 U.S.C. section 360bbb-3(b)(1), unless the authorization is terminated or revoked.  Performed at [facility], [address], [place], [place] [address]          Radiology Studies: CT Abdomen Pelvis W Contrast  Result Date: 03/24/2021 CLINICAL DATA:  Abdominal abscess/infection suspected Worsening chronic abdominal pain, recent pancreatitis. EXAM: CT ABDOMEN AND PELVIS WITH CONTRAST TECHNIQUE: Multidetector CT imaging of the abdomen and pelvis was performed using the standard protocol following bolus administration of intravenous contrast. CONTRAST:  OMNIPAQUE IOHEXOL 300 MG/ML  SOLN COMPARISON:  Most recent CT 03/09/2021.  Most recent MRI 03/14/2021 FINDINGS: Lower chest: Bilateral lower lobe atelectasis. Trace pleural thickening without significant effusion. Hepatobiliary: Mild hepatomegaly with liver spanning 21.3 cm cranial caudal. No focal liver abnormality is seen. Cholecystectomy without biliary dilatation. Pancreas: Mild indistinctness of the peripancreatic fat about the distal pancreatic tail  series 2, image 24. No progressive peripancreatic inflammation. Stable fluid collection abutting the pancreatic tail measuring 2.3 x 1.6 cm, series 2, image 22. This abuts the posterior stomach as before. Pancreatic ductal communication on prior MRI is not well characterized on the current exam. No new peripancreatic collection. Spleen: Upper normal in size spanning 13.2 cm AP. Splenule again seen at the hilum. No focal splenic abnormality. Adrenals/Urinary Tract: Normal adrenal glands. Mild right hydronephrosis with heterogeneous right renal enhancement. Moderate right perinephric edema with right retroperitoneal stranding. There is diffuse right ureteral thickening and enhancement. No visualized renal ureteral stone. No intrarenal or perirenal fluid collection. Urinary bladder is partially distended, however demonstrates wall thickening, perivesicular fat stranding, and hyperemia. There is diminished excretion from the right kidney on delayed phase imaging. Homogeneous left renal enhancement. No left hydronephrosis or inflammation. Stomach/Bowel: Normal appendix, containing enteric contrast. No appendicitis. Decompressed stomach. Minimally fluid-filled duodenum adjacent to the right kidney, likely reactive. Small bowel is otherwise unremarkable. Moderate colonic stool burden. Sigmoid colon is redundant. Vascular/Lymphatic: Normal caliber abdominal aorta. Patent portal vein. Attenuated splenic vein again seen with left upper quadrant  collaterals. Small retroperitoneal lymph nodes are likely reactive. Reproductive: Hysterectomy without adnexal mass. Other: Right retroperitoneal fat stranding with free fluid in the right pericolic gutter tracking into the right lower quadrant and pelvis. No free air. Small fat containing umbilical hernia. Musculoskeletal: No acute findings. IMPRESSION: 1. Findings consistent with cystitis and right pyelonephritis. No intrarenal or perirenal fluid collection. Right retroperitoneal fat  stranding with free fluid in the right pericolic gutter tracking into the right lower quadrant and pelvis. 2. Stable fluid collection abutting the pancreatic tail, likely pseudocyst. Mild indistinctness of the peripancreatic fat adjacent to the distal pancreas without definite acute peripancreatic inflammation. 3. Mild hepatomegaly.  Borderline splenomegaly. 4. Chronic splenic vein occlusion. Electronically Signed   By: Narda Rutherford M.D.   On: 03/24/2021 15:53        Scheduled Meds:  apixaban  5 mg Oral BID   buprenorphine-naloxone  1 tablet Sublingual BID   busPIRone  15 mg Oral TID   colestipol  1 g Oral BID   DULoxetine  60 mg Oral Daily   gabapentin  900 mg Oral QID   lipase/protease/amylase  72,000 Units Oral Daily   mirtazapine  15 mg Oral QHS   mometasone-formoterol  2 puff Inhalation BID   montelukast  10 mg Oral Daily   QUEtiapine  400 mg Oral QHS   scopolamine  1 patch Transdermal Q72H   sucralfate  1 g Oral BID   Continuous Infusions:  cefTRIAXone (ROCEPHIN)  IV 2 g (03/25/21 1454)   lactated ringers     methocarbamol (ROBAXIN) IV     ondansetron (ZOFRAN) IV 8 mg (03/24/21 1900)     LOS: 2 days   Time spent= 35 mins    Jacqulene Huntley Joline Maxcy, MD [facility]  If 7PM-7AM, please contact night-coverage  03/26/2021, 10:44 AM

## 2021-03-27 ENCOUNTER — Encounter (INDEPENDENT_AMBULATORY_CARE_PROVIDER_SITE_OTHER): Payer: Self-pay

## 2021-03-27 LAB — BASIC METABOLIC PANEL
Anion gap: 7 (ref 5–15)
Anion gap: 6 (ref 5–15)
BUN: 8 mg/dL (ref 6–20)
BUN: 8 mg/dL (ref 6–20)
CO2: 27 mmol/L (ref 22–32)
CO2: 29 mmol/L (ref 22–32)
Calcium: 8.5 mg/dL — ABNORMAL LOW (ref 8.9–10.3)
Calcium: 8.8 mg/dL — ABNORMAL LOW (ref 8.9–10.3)
Chloride: 107 mmol/L (ref 98–111)
Chloride: 106 mmol/L (ref 98–111)
Creatinine, Ser: 0.65 mg/dL (ref 0.44–1.00)
Creatinine, Ser: 0.82 mg/dL (ref 0.44–1.00)
GFR, Estimated: >60 mL/min (ref >60)
GFR, Estimated: >60 mL/min (ref >60)
Glucose, Bld: 101 mg/dL — ABNORMAL HIGH (ref 70–99)
Glucose, Bld: 77 mg/dL (ref 70–99)
Potassium: 2.8 mmol/L — ABNORMAL LOW (ref 3.5–5.1)
Potassium: 3.5 mmol/L (ref 3.5–5.1)
Sodium: 141 mmol/L (ref 135–145)
Sodium: 141 mmol/L (ref 135–145)

## 2021-03-27 LAB — CBC
HCT: 29.4 % — ABNORMAL LOW (ref 36.0–46.0)
Hemoglobin: 9.4 g/dL — ABNORMAL LOW (ref 12.0–15.0)
MCH: 28.1 pg (ref 26.0–34.0)
MCHC: 32 g/dL (ref 30.0–36.0)
MCV: 88 fL (ref 80.0–100.0)
Platelets: 176 10*3/uL (ref 150–400)
RBC: 3.34 MIL/uL — ABNORMAL LOW (ref 3.87–5.11)
RDW: 13.5 % (ref 11.5–15.5)
WBC: 5.1 10*3/uL (ref 4.0–10.5)
nRBC: 0 % (ref 0.0–0.2)

## 2021-03-27 LAB — URINE CULTURE: Culture: >=100000 — AB

## 2021-03-27 LAB — MAGNESIUM: Magnesium: 1.9 mg/dL (ref 1.7–2.4)

## 2021-03-27 MED ORDER — POTASSIUM CHLORIDE 20 MEQ PO PACK
40.0000 meq | PACK | Freq: Once | ORAL | Status: AC
  Administered 2021-03-27: 40 meq via ORAL
  Filled 2021-03-27: qty 2

## 2021-03-27 MED ORDER — SULFAMETHOXAZOLE-TRIMETHOPRIM 800-160 MG PO TABS: 1.0000 | ORAL_TABLET | Freq: Two times a day (BID) | ORAL | 0 refills | Status: AC

## 2021-03-27 MED ORDER — SUMATRIPTAN SUCCINATE 50 MG PO TABS: 50.0000 mg | ORAL_TABLET | Freq: Three times a day (TID) | ORAL | 0 refills | Status: DC | PRN

## 2021-03-27 MED ORDER — POTASSIUM CHLORIDE 10 MEQ/100ML IV SOLN
10.0000 meq | INTRAVENOUS | Status: DC
  Administered 2021-03-27: 10 meq via INTRAVENOUS
  Filled 2021-03-27: qty 100

## 2021-03-27 MED ORDER — SUMATRIPTAN SUCCINATE 50 MG PO TABS: 50.0000 mg | ORAL_TABLET | Freq: Two times a day (BID) | ORAL | 0 refills | Status: DC | PRN

## 2021-03-27 MED ORDER — POTASSIUM CHLORIDE 10 MEQ/100ML IV SOLN
10.0000 meq | INTRAVENOUS | Status: AC
  Administered 2021-03-27: 10 meq via INTRAVENOUS
  Filled 2021-03-27: qty 100

## 2021-03-27 MED ORDER — CIPROFLOXACIN HCL 500 MG PO TABS: 500.0000 mg | ORAL_TABLET | Freq: Two times a day (BID) | ORAL | 0 refills | Status: DC

## 2021-03-27 MED ORDER — POTASSIUM CHLORIDE CRYS ER 20 MEQ PO TBCR
40.0000 meq | EXTENDED_RELEASE_TABLET | Freq: Once | ORAL | Status: AC
  Administered 2021-03-27: 40 meq via ORAL
  Filled 2021-03-27: qty 2

## 2021-03-27 MED ORDER — SUMATRIPTAN SUCCINATE 50 MG PO TABS
50.0000 mg | ORAL_TABLET | Freq: Three times a day (TID) | ORAL | Status: DC | PRN
  Administered 2021-03-27: 50 mg via ORAL
  Filled 2021-03-27 (×3): qty 1

## 2021-03-27 NOTE — Plan of Care (Signed)
  Problem: Education: Goal: Knowledge of General Education information will improve Description Including pain rating scale, medication(s)/side effects and non-pharmacologic comfort measures Outcome: Progressing   

## 2021-03-27 NOTE — Discharge Summary (Signed)
Physician Discharge Summary  Tricia Brown ZOX:[id number] DOB: [birth year] DOA: 03/24/2021  PCP: [name], MD  Admit date: 03/24/2021 Discharge date: 03/27/2021  Admitted From: Home Disposition: Home  Recommendations for Outpatient Follow-up:  Follow up with PCP in 1-2 weeks Please obtain BMP/CBC in one week your next doctors visit.  10 Tablets of Imitrex given for likely migraines.  Advised not to exceed 2 doses in 24 hours Outpatient neurology referral for chronic headaches/migraines Bactrim twice daily for 7 days   Discharge Condition: Stable CODE STATUS: Full code Diet recommendation: Regular  Brief/Interim Summary: 45 year old with past medical history of pancreatitis, opiate use on Suboxone recently had pancreatitis discharged on March 15, 2021.  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  Also had sepsis secondary to Enterococcus UTI, DVT of right cephalic vein on Eliquis.  Not reporting of hematuria, dysuria.  CT showed right-sided pyelonephritis and was started on IV Rocephin and admitted to the hospital.  Cultures ended up growing E. coli therefore discharged on oral Bactrim.  Also reported of headache during the hospitalization which was likely thought to be migraine therefore started on Imitrex which has helped her.  Outpatient referral to neurology given.       Sepsis secondary to acute pyelonephritis, E. coli - Sepsis evidenced by tachycardia, leukocytosis and source.  Sepsis physiology slowly improving - Urine culture is growing E. coli, sensitive to multiple medications.  Given pyelonephritis and risk of interaction with other medication opted for Bactrim twice daily for 7 days. - CT showed pyelonephritis   Severe headache with nausea - Suspect migraine.  CT head negative.  Imitrex has helped her therefore prescription given, advised not to exceed 2 tablets in 24 hours    Acute kidney injury, POA.  Resolved - Resolved.  Baseline creatinine 0.8  Hypokalemia - Repletion ordered.  Repeat labs with PCP in 1-2 weeks   History of opioid use - On Suboxone   Recent history of complicated pancreatitis - Stable for now.  Continue to monitor   Mild transaminitis - Closely monitor   Recent history of DVT and cephalic vein, diagnosed March 2022 - Continue Eliquis   Mood disorder -Continue bedtime Seroquel and Remeron    Body mass index is 28.31 kg/m.         Discharge Diagnoses:  Principal Problem:   Pyelonephritis Active Problems:   Opioid use disorder, severe, dependence (HCC)   Pancreatitis   AKI (acute kidney injury) (HCC)   Transaminitis   Subjective: Feels better no complaints.  Headaches has improved with Imitrex.  Requesting outpatient neurology follow-up.  Discharge Exam: Vitals:   03/27/21 0610 03/27/21 0748  BP: 112/72   Pulse: 70   Resp: 18   Temp:    SpO2: 100% 100%   Vitals:   03/26/21 2041 03/26/21 2043 03/27/21 0610 03/27/21 0748  BP:  127/76 112/72   Pulse:  83 70   Resp:  18 18   Temp:  97.9 F (36.6 C)    TempSrc:  Oral    SpO2: 100% 99% 100% 100%  Weight:      Height:        General: Pt is alert, awake, not in acute distress Cardiovascular: RRR, S1/S2 +, no rubs, no gallops Respiratory: CTA bilaterally, no wheezing, no rhonchi Abdominal: Soft, NT, ND, bowel sounds + Extremities: no edema, no cyanosis  Discharge Instructions  Discharge Instructions     Ambulatory referral to Neurology  Complete by: As directed    An appointment is requested in approximately: 2 weeks. Dx headaches      Allergies as of 03/27/2021   No Known Allergies      Medication List     TAKE these medications    acetaminophen 500 MG tablet Commonly known as: TYLENOL Take 500-1,000 mg by mouth every 6 (six) hours as needed for mild pain (or headaches).   budesonide-formoterol 80-4.5 MCG/ACT inhaler Commonly known  as: SYMBICORT Inhale 1 puff into the lungs in the morning and at bedtime.   busPIRone 15 MG tablet Commonly known as: BUSPAR Take 15 mg by mouth 3 (three) times daily.   DULoxetine 60 MG capsule Commonly known as: CYMBALTA Take 60 mg by mouth daily.   Eliquis 5 MG Tabs tablet Generic drug: apixaban Take 5 mg by mouth 2 (two) times daily.   gabapentin 300 MG capsule Commonly known as: NEURONTIN Take 900 mg by mouth 4 (four) times daily.   mirtazapine 15 MG tablet Commonly known as: REMERON Take 15 mg by mouth at bedtime.   montelukast 10 MG tablet Commonly known as: SINGULAIR Take 10 mg by mouth at bedtime.   MULTIVITAMIN ADULT PO Take 1 tablet by mouth daily.   pantoprazole 40 MG tablet Commonly known as: PROTONIX Take 1 tablet (40 mg total) by mouth 2 (two) times daily before a meal.   ProAir HFA 108 (90 Base) MCG/ACT inhaler Generic drug: albuterol Inhale 2 puffs into the lungs every 6 (six) hours as needed for wheezing or shortness of breath.   QUEtiapine 400 MG 24 hr tablet Commonly known as: SEROQUEL XR Take 400 mg by mouth at bedtime.   QUEtiapine 25 MG tablet Commonly known as: SEROQUEL Take 25 mg by mouth 4 (four) times daily.   Suboxone 8-2 MG Film Generic drug: Buprenorphine HCl-Naloxone HCl Place 1 Film under the tongue 2 (two) times daily.   sulfamethoxazole-trimethoprim 800-160 MG tablet Commonly known as: BACTRIM DS Take 1 tablet by mouth 2 (two) times daily for 7 days.   SUMAtriptan 50 MG tablet Commonly known as: IMITREX Take 1 tablet (50 mg total) by mouth every 12 (twelve) hours as needed for migraine or headache (Do not exceed 2 doses in 24hrs.). May repeat in 2 hours if headache persists or recurs.   valACYclovir 500 MG tablet Commonly known as: VALTREX Take 500 mg by mouth daily as needed (as directed, for outbreaks).       ASK your doctor about these medications    colestipol 1 g tablet Commonly known as: COLESTID Take 1  tablet (1 g total) by mouth 2 (two) times daily. Hold if constipation   lipase/protease/amylase 96045 UNITS Cpep capsule Commonly known as: CREON Take 2 capsules (72,000 Units total) by mouth daily.   ondansetron 4 MG tablet Commonly known as: ZOFRAN Take 1 tablet (4 mg total) by mouth every 8 (eight) hours as needed for nausea or vomiting.   sucralfate 1 g tablet Commonly known as: CARAFATE Take 1 tablet (1 g total) by mouth 2 (two) times daily.        No Known Allergies  You were cared for by a hospitalist during your hospital stay. If you have any questions about your discharge medications or the care you received while you were in the hospital after you are discharged, you can call the unit and asked to speak with the hospitalist on call if the hospitalist that took care of you is not available. Once you  are discharged, your primary care physician will handle any further medical issues. Please note that no refills for any discharge medications will be authorized once you are discharged, as it is imperative that you return to your primary care physician (or establish a relationship with a primary care physician if you do not have one) for your aftercare needs so that they can reassess your need for medications and monitor your lab values.   Procedures/Studies: CT HEAD WO CONTRAST  Result Date: 03/26/2021 CLINICAL DATA:  Headache for 3 days.  Nausea and dizziness. EXAM: CT HEAD WITHOUT CONTRAST TECHNIQUE: Contiguous axial images were obtained from the base of the skull through the vertex without intravenous contrast. COMPARISON:  02/15/2004 FINDINGS: Brain: There is no evidence of an acute infarct, intracranial hemorrhage, mass, midline shift, or extra-axial fluid collection. The ventricles and sulci are normal. Vascular: No hyperdense vessel. Skull: No fracture or suspicious osseous lesion. Sinuses/Orbits: Minimal scattered mucosal thickening in the paranasal sinuses. Clear mastoid air  cells. Unremarkable orbits. Other: None. IMPRESSION: Negative head CT. Electronically Signed   By: Sebastian AcheAllen  Grady M.D.   On: 03/26/2021 13:53   CT Abdomen Pelvis W Contrast  Result Date: 03/24/2021 CLINICAL DATA:  Abdominal abscess/infection suspected Worsening chronic abdominal pain, recent pancreatitis. EXAM: CT ABDOMEN AND PELVIS WITH CONTRAST TECHNIQUE: Multidetector CT imaging of the abdomen and pelvis was performed using the standard protocol following bolus administration of intravenous contrast. CONTRAST:  100mL OMNIPAQUE IOHEXOL 300 MG/ML  SOLN COMPARISON:  Most recent CT 03/09/2021.  Most recent MRI 03/14/2021 FINDINGS: Lower chest: Bilateral lower lobe atelectasis. Trace pleural thickening without significant effusion. Hepatobiliary: Mild hepatomegaly with liver spanning 21.3 cm cranial caudal. No focal liver abnormality is seen. Cholecystectomy without biliary dilatation. Pancreas: Mild indistinctness of the peripancreatic fat about the distal pancreatic tail series 2, image 24. No progressive peripancreatic inflammation. Stable fluid collection abutting the pancreatic tail measuring 2.3 x 1.6 cm, series 2, image 22. This abuts the posterior stomach as before. Pancreatic ductal communication on prior MRI is not well characterized on the current exam. No new peripancreatic collection. Spleen: Upper normal in size spanning 13.2 cm AP. Splenule again seen at the hilum. No focal splenic abnormality. Adrenals/Urinary Tract: Normal adrenal glands. Mild right hydronephrosis with heterogeneous right renal enhancement. Moderate right perinephric edema with right retroperitoneal stranding. There is diffuse right ureteral thickening and enhancement. No visualized renal ureteral stone. No intrarenal or perirenal fluid collection. Urinary bladder is partially distended, however demonstrates wall thickening, perivesicular fat stranding, and hyperemia. There is diminished excretion from the right kidney on delayed  phase imaging. Homogeneous left renal enhancement. No left hydronephrosis or inflammation. Stomach/Bowel: Normal appendix, containing enteric contrast. No appendicitis. Decompressed stomach. Minimally fluid-filled duodenum adjacent to the right kidney, likely reactive. Small bowel is otherwise unremarkable. Moderate colonic stool burden. Sigmoid colon is redundant. Vascular/Lymphatic: Normal caliber abdominal aorta. Patent portal vein. Attenuated splenic vein again seen with left upper quadrant collaterals. Small retroperitoneal lymph nodes are likely reactive. Reproductive: Hysterectomy without adnexal mass. Other: Right retroperitoneal fat stranding with free fluid in the right pericolic gutter tracking into the right lower quadrant and pelvis. No free air. Small fat containing umbilical hernia. Musculoskeletal: No acute findings. IMPRESSION: 1. Findings consistent with cystitis and right pyelonephritis. No intrarenal or perirenal fluid collection. Right retroperitoneal fat stranding with free fluid in the right pericolic gutter tracking into the right lower quadrant and pelvis. 2. Stable fluid collection abutting the pancreatic tail, likely pseudocyst. Mild indistinctness of the peripancreatic fat  adjacent to the distal pancreas without definite acute peripancreatic inflammation. 3. Mild hepatomegaly.  Borderline splenomegaly. 4. Chronic splenic vein occlusion. Electronically Signed   By: Narda Rutherford M.D.   On: 03/24/2021 15:53   CT ABDOMEN PELVIS W CONTRAST  Result Date: 03/09/2021 CLINICAL DATA:  Right lower quadrant abdominal pain. Appendicitis suspected. Patient reports urinary frequency and dysuria. Patient reports daily pain due to chronic pancreatitis. EXAM: CT ABDOMEN AND PELVIS WITH CONTRAST TECHNIQUE: Multidetector CT imaging of the abdomen and pelvis was performed using the standard protocol following bolus administration of intravenous contrast. CONTRAST:  75mL OMNIPAQUE IOHEXOL 300 MG/ML   SOLN COMPARISON:  Abdominal CT 09/26/2020.  Abdominal MRI 09/28/2020 FINDINGS: Lower chest: Hypoventilatory atelectasis without confluent airspace disease or pleural effusion. Heart is normal in size. Hepatobiliary: No focal liver abnormality is seen. Status post cholecystectomy. No biliary dilatation. Pancreas: Mild fat stranding and edema about the distal pancreatic body and tail, for example series 2, image 18. Small fluid collection about the pancreatic tail abuts the stomach and measures approximately 1.8 x 1.5 x 1.6 cm, series 2, image 18. This may represent contraction of the previous lesser sac fluid collection which is no longer seen. No other pancreatic collection. Mild chronic atrophy of the pancreatic head and body. No pancreatic necrosis. No ductal dilatation or pancreatic mass. Spleen: Normal in size without focal abnormality. Splenule at the hilum. Adrenals/Urinary Tract: No adrenal nodule. No hydronephrosis or perinephric edema. Homogeneous renal enhancement with symmetric excretion on delayed phase imaging. No focal lesion or renal calculi. Urinary bladder is physiologically distended without wall thickening. Stomach/Bowel: Decompressed stomach. There is no small bowel obstruction, administered enteric contrast reaches the colon. No small bowel inflammation. Terminal ileum is normal without inflammation. Normal appendix, series 2, image 63. Occasional fluid levels throughout the colon. No colonic wall thickening or pericolonic edema. Vascular/Lymphatic: Normal caliber abdominal aorta. Patent portal vein. The splenic vein is attenuated with left upper quadrant collaterals suggesting splenic vein occlusion, new from prior exam but likely developed in the interim. No abdominopelvic adenopathy. Reproductive: Hysterectomy.  No adnexal mass. Other: No ascites. No free air. Small fat containing umbilical hernia. Musculoskeletal: There are no acute or suspicious osseous abnormalities. IMPRESSION: 1. Normal  appendix. 2. Mild fat stranding and edema about the distal pancreatic body and tail, consistent with acute pancreatitis. Small fluid collection about the pancreatic tail abuts the stomach and measures 1.8 x 1.5 x 1.6 cm. This may represent contraction of the previous lesser sac fluid collection. 3. Occasional fluid levels throughout the colon, can be seen with diarrheal illness. No colonic wall thickening or pericolonic edema. 4. 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 Electronically Signed   By: Narda Rutherford M.D.   On: 03/09/2021 20:42   MR 3D Recon At Scanner  Result Date: 03/14/2021 CLINICAL DATA:  History of necrotizing pancreatitis. Abdominal pain. EXAM: MRI ABDOMEN WITHOUT AND WITH CONTRAST (INCLUDING MRCP) TECHNIQUE: Multiplanar multisequence MR imaging of the abdomen was performed both before and after the administration of intravenous contrast. Heavily T2-weighted images of the biliary and pancreatic ducts were obtained, and three-dimensional MRCP images were rendered by post processing. CONTRAST:  8mL GADAVIST GADOBUTROL 1 MMOL/ML IV SOLN COMPARISON:  03/09/2021 CT.  Prior MRI of 09/28/2020. FINDINGS: Lower chest: Normal heart size without pericardial or pleural effusion. Hepatobiliary: Hepatomegaly, including at 20.0 cm craniocaudal. No focal liver lesion. Cholecystectomy, without biliary duct dilatation or choledocholithiasis.  Pancreas: Pancreatic atrophy. Resolution of previously described peripancreatic inflammation. Minimally peripherally enhancing fluid signal lesion about the ventral pancreatic tail, impressing into the urinary bladder measures 2.2 x 1.7 cm on 17/3 versus similar on the prior exam (when remeasured). Communicates with the mildly dilated upstream pancreatic duct including on images 46 through 53 of series 13. No evidence of pancreatic necrosis.  No pancreas divisum.  Spleen:  Normal in size, without focal abnormality. Adrenals/Urinary Tract: Normal adrenal glands. Normal kidneys, without hydronephrosis. Stomach/Bowel: Otherwise normal stomach. Colonic stool burden suggests constipation. Normal caliber of small bowel loops. Vascular/Lymphatic: Aortic atherosclerosis. Patent portal vein. Although the splenic vein is difficult to follow, gastroepiploic collaterals suggest underlying splenic vein insufficiency. These are new since the MRI of 09/28/2020, including on 67/25. No retroperitoneal or retrocrural adenopathy. Other:  No ascites. Musculoskeletal: Mild convex right thoracic spine curvature. IMPRESSION: 1. Since the CT of 3 days ago, similar size of a presumed pseudocyst arising from the ventral pancreatic tail, impressing into the stomach. This is decreased in size and more well-defined compared to the comparison MRI of 09/28/2020. 2. Resolution of acute pancreatitis. 3. No choledocholithiasis or pancreas divisum. 4.  Aortic Atherosclerosis (ICD10-I70.0). Electronically Signed   By: Jeronimo Greaves M.D.   On: 03/14/2021 14:47   MR ABDOMEN MRCP W WO CONTAST  Result Date: 03/14/2021 CLINICAL DATA:  History of necrotizing pancreatitis. Abdominal pain. EXAM: MRI ABDOMEN WITHOUT AND WITH CONTRAST (INCLUDING MRCP) TECHNIQUE: Multiplanar multisequence MR imaging of the abdomen was performed both before and after the administration of intravenous contrast. Heavily T2-weighted images of the biliary and pancreatic ducts were obtained, and three-dimensional MRCP images were rendered by post processing. CONTRAST:  8mL GADAVIST GADOBUTROL 1 MMOL/ML IV SOLN COMPARISON:  03/09/2021 CT.  Prior MRI of 09/28/2020. FINDINGS: Lower chest: Normal heart size without pericardial or pleural effusion. Hepatobiliary: Hepatomegaly, including at 20.0 cm craniocaudal. No focal liver lesion. Cholecystectomy, without biliary duct dilatation or choledocholithiasis. Pancreas: Pancreatic atrophy. Resolution  of previously described peripancreatic inflammation. Minimally peripherally enhancing fluid signal lesion about the ventral pancreatic tail, impressing into the urinary bladder measures 2.2 x 1.7 cm on 17/3 versus similar on the prior exam (when remeasured). Communicates with the mildly dilated upstream pancreatic duct including on images 46 through 53 of series 13. No evidence of pancreatic necrosis.  No pancreas divisum. Spleen:  Normal in size, without focal abnormality. Adrenals/Urinary Tract: Normal adrenal glands. Normal kidneys, without hydronephrosis. Stomach/Bowel: Otherwise normal stomach. Colonic stool burden suggests constipation. Normal caliber of small bowel loops. Vascular/Lymphatic: Aortic atherosclerosis. Patent portal vein. Although the splenic vein is difficult to follow, gastroepiploic collaterals suggest underlying splenic vein insufficiency. These are new since the MRI of 09/28/2020, including on 67/25. No retroperitoneal or retrocrural adenopathy. Other:  No ascites. Musculoskeletal: Mild convex right thoracic spine curvature. IMPRESSION: 1. Since the CT of 3 days ago, similar size of a presumed pseudocyst arising from the ventral pancreatic tail, impressing into the stomach. This is decreased in size and more well-defined compared to the comparison MRI of 09/28/2020. 2. Resolution of acute pancreatitis. 3. No choledocholithiasis or pancreas divisum. 4.  Aortic Atherosclerosis (ICD10-I70.0). Electronically Signed   By: Jeronimo Greaves M.D.   On: 03/14/2021 14:47     The results of significant diagnostics from this hospitalization (including imaging, microbiology, ancillary and laboratory) are listed below for reference.     Microbiology: Recent Results (from the past 240 hour(s))  Urine culture     Status: Abnormal   Collection Time: 03/24/21  5:14 PM   Specimen: Urine, Clean Catch  Result Value Ref Range Status   Specimen Description   Final    URINE, CLEAN CATCH Performed at  [facility], [address], [place], [place] [address]    Special Requests   Final    NONE Performed at [facility], [address] [place], [place] [address]    Culture >=100,000 COLONIES/mL ESCHERICHIA COLI (A)  Final   Report Status 03/27/2021 FINAL  Final   Organism ID, Bacteria ESCHERICHIA COLI (A)  Final      Susceptibility   Escherichia coli - MIC*    AMPICILLIN >=32 RESISTANT Resistant     CEFAZOLIN <=4 SENSITIVE Sensitive     CEFEPIME <=0.12 SENSITIVE Sensitive     CEFTRIAXONE <=0.25 SENSITIVE Sensitive     CIPROFLOXACIN <=0.25 SENSITIVE Sensitive     GENTAMICIN <=1 SENSITIVE Sensitive     IMIPENEM <=0.25 SENSITIVE Sensitive     NITROFURANTOIN 64 INTERMEDIATE Intermediate     TRIMETH/SULFA <=20 SENSITIVE Sensitive     AMPICILLIN/SULBACTAM 8 SENSITIVE Sensitive     PIP/TAZO <=4 SENSITIVE Sensitive     * >=100,000 COLONIES/mL ESCHERICHIA COLI  Resp Panel by RT-PCR (Flu A&B, Covid) Nasopharyngeal Swab     Status: None   Collection Time: 03/24/21  7:04 PM   Specimen: Nasopharyngeal Swab; Nasopharyngeal(NP) swabs in vial transport medium  Result Value Ref Range Status   SARS Coronavirus 2 by RT PCR NEGATIVE NEGATIVE Final    Comment: (NOTE) SARS-CoV-2 target nucleic acids are NOT DETECTED.  The SARS-CoV-2 RNA is generally detectable in upper respiratory specimens during the acute phase of infection. The lowest concentration of SARS-CoV-2 viral copies this assay can detect is 138 copies/mL. A negative result does not preclude SARS-Cov-2 infection and should not be used as the sole basis for treatment or other patient management decisions. A negative result may occur with  improper specimen collection/handling, submission of specimen other than nasopharyngeal swab, presence of viral mutation(s) within the areas targeted by this assay, and inadequate number of viral copies(<138 copies/mL). A negative result must be combined  with clinical observations, patient history, and epidemiological information. The expected result is Negative.  Fact Sheet for Patients:  BloggerCourse.com  Fact Sheet for Healthcare Providers:  SeriousBroker.it  This test is no t yet approved or cleared by the [place] FDA and  has been authorized for detection and/or diagnosis of SARS-CoV-2 by FDA under an Emergency Use Authorization (EUA). This EUA will remain  in effect (meaning this test can be used) for the duration of the COVID-19 declaration under Section 564(b)(1) of the Act, 21 U.S.C.section 360bbb-3(b)(1), unless the authorization is terminated  or revoked sooner.       Influenza A by PCR NEGATIVE NEGATIVE Final   Influenza B by PCR NEGATIVE NEGATIVE Final    Comment: (NOTE) The Xpert Xpress SARS-CoV-2/FLU/RSV plus assay is intended as an aid in the diagnosis of influenza from Nasopharyngeal swab specimens and should not be used as a sole basis for treatment. Nasal washings and aspirates are unacceptable for Xpert Xpress SARS-CoV-2/FLU/RSV testing.  Fact Sheet for Patients: BloggerCourse.com  Fact Sheet for Healthcare Providers: SeriousBroker.it  This test is not yet approved or cleared by the [place] FDA and has been authorized for detection and/or diagnosis of SARS-CoV-2 by FDA under an Emergency Use Authorization (EUA). This EUA will remain in effect (meaning this test can be used) for the duration of the COVID-19 declaration under Section 564(b)(1)  of the Act, 21 U.S.C. section 360bbb-3(b)(1), unless the authorization is terminated or revoked.  Performed at [facility], [address] [place], [place] [address]      Labs: BNP (last 3 results) No results for input(s): BNP in the last 8760 hours. Basic Metabolic Panel: Recent Labs  Lab 03/24/21 1358 03/25/21 0400  03/27/21 0428  NA 139 138 141  K 3.7 3.9 2.8*  CL 100 101 107  CO2 26 29 27   GLUCOSE 125* 125* 101*  BUN 12 12 8   CREATININE 1.13* 0.87 0.65  CALCIUM 9.4 8.7* 8.5*  MG  --   --  1.9   Liver Function Tests: Recent Labs  Lab 03/24/21 1358 03/25/21 0400  AST 56* 34  ALT 79* 56*  ALKPHOS 288* 247*  BILITOT 2.1* 1.6*  PROT 8.4* 6.7  ALBUMIN 3.8 3.0*   Recent Labs  Lab 03/24/21 1358  LIPASE 26   No results for input(s): AMMONIA in the last 168 hours. CBC: Recent Labs  Lab 03/24/21 1358 03/25/21 0400 03/27/21 0428  WBC 18.9* 13.8* 5.1  HGB 14.4 11.6* 9.4*  HCT 44.0 35.9* 29.4*  MCV 86.4 88.9 88.0  PLT 300 215 176   Cardiac Enzymes: No results for input(s): CKTOTAL, CKMB, CKMBINDEX, TROPONINI in the last 168 hours. BNP: Invalid input(s): POCBNP CBG: No results for input(s): GLUCAP in the last 168 hours. D-Dimer No results for input(s): DDIMER in the last 72 hours. Hgb A1c No results for input(s): HGBA1C in the last 72 hours. Lipid Profile No results for input(s): CHOL, HDL, LDLCALC, TRIG, CHOLHDL, LDLDIRECT in the last 72 hours. Thyroid function studies No results for input(s): TSH, T4TOTAL, T3FREE, THYROIDAB in the last 72 hours.  Invalid input(s): FREET3 Anemia work up No results for input(s): VITAMINB12, FOLATE, FERRITIN, TIBC, IRON, RETICCTPCT in the last 72 hours. Urinalysis    Component Value Date/Time   COLORURINE AMBER (A) 03/24/2021 1358   APPEARANCEUR TURBID (A) 03/24/2021 1358   APPEARANCEUR CLEAR 03/23/2013 1621   LABSPEC 1.013 03/24/2021 1358   LABSPEC 1.021 03/23/2013 1621   PHURINE 5.0 03/24/2021 1358   GLUCOSEU NEGATIVE 03/24/2021 1358   GLUCOSEU see comment 03/23/2013 1621   HGBUR MODERATE (A) 03/24/2021 1358   BILIRUBINUR NEGATIVE 03/24/2021 1358   BILIRUBINUR see comment 03/23/2013 1621   KETONESUR NEGATIVE 03/24/2021 1358   PROTEINUR 100 (A) 03/24/2021 1358   UROBILINOGEN 1.0 09/29/2014 2116   NITRITE POSITIVE (A) 03/24/2021  1358   LEUKOCYTESUR LARGE (A) 03/24/2021 1358   LEUKOCYTESUR see comment 03/23/2013 1621   Sepsis Labs Invalid input(s): PROCALCITONIN,  WBC,  LACTICIDVEN Microbiology Recent Results (from the past 240 hour(s))  Urine culture     Status: Abnormal   Collection Time: 03/24/21  5:14 PM   Specimen: Urine, Clean Catch  Result Value Ref Range Status   Specimen Description   Final    URINE, CLEAN CATCH Performed at [facility], [address] [place], [place]    Special Requests   Final    NONE Performed at [facility], [address], [place], [place]    Culture >=100,000 COLONIES/mL ESCHERICHIA COLI (A)  Final   Report Status 03/27/2021 FINAL  Final   Organism ID, Bacteria ESCHERICHIA COLI (A)  Final      Susceptibility   Escherichia coli - MIC*    AMPICILLIN >=32 RESISTANT Resistant     CEFAZOLIN <=4 SENSITIVE Sensitive     CEFEPIME <=0.12 SENSITIVE Sensitive  CEFTRIAXONE <=0.25 SENSITIVE Sensitive     CIPROFLOXACIN <=0.25 SENSITIVE Sensitive     GENTAMICIN <=1 SENSITIVE Sensitive     IMIPENEM <=0.25 SENSITIVE Sensitive     NITROFURANTOIN 64 INTERMEDIATE Intermediate     TRIMETH/SULFA <=20 SENSITIVE Sensitive     AMPICILLIN/SULBACTAM 8 SENSITIVE Sensitive     PIP/TAZO <=4 SENSITIVE Sensitive     * >=100,000 COLONIES/mL ESCHERICHIA COLI  Resp Panel by RT-PCR (Flu A&B, Covid) Nasopharyngeal Swab     Status: None   Collection Time: 03/24/21  7:04 PM   Specimen: Nasopharyngeal Swab; Nasopharyngeal(NP) swabs in vial transport medium  Result Value Ref Range Status   SARS Coronavirus 2 by RT PCR NEGATIVE NEGATIVE Final    Comment: (NOTE) SARS-CoV-2 target nucleic acids are NOT DETECTED.  The SARS-CoV-2 RNA is generally detectable in upper respiratory specimens during the acute phase of infection. The lowest concentration of SARS-CoV-2 viral copies this assay can detect is 138 copies/mL. A negative result does not  preclude SARS-Cov-2 infection and should not be used as the sole basis for treatment or other patient management decisions. A negative result may occur with  improper specimen collection/handling, submission of specimen other than nasopharyngeal swab, presence of viral mutation(s) within the areas targeted by this assay, and inadequate number of viral copies(<138 copies/mL). A negative result must be combined with clinical observations, patient history, and epidemiological information. The expected result is Negative.  Fact Sheet for Patients:  BloggerCourse.com  Fact Sheet for Healthcare Providers:  SeriousBroker.it  This test is no t yet approved or cleared by the [place] FDA and  has been authorized for detection and/or diagnosis of SARS-CoV-2 by FDA under an Emergency Use Authorization (EUA). This EUA will remain  in effect (meaning this test can be used) for the duration of the COVID-19 declaration under Section 564(b)(1) of the Act, 21 U.S.C.section 360bbb-3(b)(1), unless the authorization is terminated  or revoked sooner.       Influenza A by PCR NEGATIVE NEGATIVE Final   Influenza B by PCR NEGATIVE NEGATIVE Final    Comment: (NOTE) The Xpert Xpress SARS-CoV-2/FLU/RSV plus assay is intended as an aid in the diagnosis of influenza from Nasopharyngeal swab specimens and should not be used as a sole basis for treatment. Nasal washings and aspirates are unacceptable for Xpert Xpress SARS-CoV-2/FLU/RSV testing.  Fact Sheet for Patients: BloggerCourse.com  Fact Sheet for Healthcare Providers: SeriousBroker.it  This test is not yet approved or cleared by the [place] FDA and has been authorized for detection and/or diagnosis of SARS-CoV-2 by FDA under an Emergency Use Authorization (EUA). This EUA will remain in effect (meaning this test can be used) for the  duration of the COVID-19 declaration under Section 564(b)(1) of the Act, 21 U.S.C. section 360bbb-3(b)(1), unless the authorization is terminated or revoked.  Performed at [facility], [address], [place], [place] [address]      Time coordinating discharge:  I have spent 35 minutes face to face with the patient and on the ward discussing the patients care, assessment, plan and disposition with other care givers. >50% of the time was devoted counseling the patient about the risks and benefits of treatment/Discharge disposition and coordinating care.   SIGNED:   Dimple Nanas, MD  [facility] 03/27/2021, 11:16 AM   If 7PM-7AM, please contact night-coverage

## 2021-03-27 NOTE — Progress Notes (Signed)
MD stated patient does not need the third IV bag of potassium. Will complete potassium infusion after second bag. Will continue to monitor after changes.

## 2021-03-27 NOTE — Progress Notes (Signed)
Discharge paperwork given and explained to patient. PIV removed. Pressure Dressing applied. Pt awaiting transport.

## 2021-05-19 ENCOUNTER — Encounter: Payer: Self-pay | Admitting: Neurology

## 2021-05-19 ENCOUNTER — Ambulatory Visit: Payer: Medicaid Other | Admitting: Neurology

## 2021-05-19 ENCOUNTER — Telehealth: Payer: Self-pay | Admitting: Neurology

## 2021-05-19 VITALS — BP 128/84 | HR 98 | Ht 67.0 in | Wt 161.8 lb

## 2021-05-19 DIAGNOSIS — G43719 Chronic migraine without aura, intractable, without status migrainosus: Secondary | ICD-10-CM | POA: Diagnosis not present

## 2021-05-19 DIAGNOSIS — H539 Unspecified visual disturbance: Secondary | ICD-10-CM | POA: Diagnosis not present

## 2021-05-19 DIAGNOSIS — R519 Headache, unspecified: Secondary | ICD-10-CM

## 2021-05-19 DIAGNOSIS — R51 Headache with orthostatic component, not elsewhere classified: Secondary | ICD-10-CM | POA: Diagnosis not present

## 2021-05-19 MED ORDER — RIZATRIPTAN BENZOATE 10 MG PO TBDP
10.0000 mg | ORAL_TABLET | ORAL | 11 refills | Status: DC | PRN
Start: 2021-05-19 — End: 2022-06-02

## 2021-05-19 MED ORDER — PROMETHAZINE HCL 12.5 MG PO TABS: 12.5000 mg | ORAL_TABLET | Freq: Four times a day (QID) | ORAL | 6 refills | Status: DC | PRN

## 2021-05-19 MED ORDER — EMGALITY 120 MG/ML ~~LOC~~ SOAJ: SUBCUTANEOUS | 0 refills | Status: DC

## 2021-05-19 MED ORDER — EMGALITY 120 MG/ML ~~LOC~~ SOAJ: 120.0000 mg | SUBCUTANEOUS | 11 refills | Status: DC

## 2021-05-19 NOTE — Telephone Encounter (Signed)
Medicaid order sent to GI. NPR they will reach out to the patient to schedule.

## 2021-05-19 NOTE — Addendum Note (Signed)
Addended by: Guy Begin on: 05/19/2021 09:28 AM   Modules accepted: Orders

## 2021-05-19 NOTE — Patient Instructions (Addendum)
Start emgality - once monthly Lab MRI of the brain Acute/emergency: Rizatriptan: Please take one tablet at the onset of your headache. If it does not improve the symptoms please take one additional tablet. Do not take more then 2 tablets in 24hrs. Do not take use more then 2 to 3 times in a week. Phenergan for nausea.  Rizatriptan Disintegrating Tablets What is this medication? RIZATRIPTAN (rye za TRIP tan) treats migraines. It works by blocking pain signals and narrowing blood vessels in the brain. It belongs to a group ofmedications called triptans. It is not used to prevent migraines. This medicine may be used for other purposes; ask your health care provider orpharmacist if you have questions. COMMON BRAND NAME(S): Maxalt-MLT What should I tell my care team before I take this medication? They need to know if you have any of these conditions: Cigarette smoker Circulation problems in fingers and toes Diabetes Heart disease High blood pressure High cholesterol History of irregular heartbeat History of stroke Kidney disease Liver disease Stomach or intestine problems An unusual or allergic reaction to rizatriptan, other medications, foods, dyes, or preservatives Pregnant or trying to get pregnant Breast-feeding How should I use this medication? Take this medication by mouth. Follow the directions on the prescription label. Leave the tablet in the sealed blister pack until you are ready to take it. With dry hands, open the blister and gently remove the tablet. If the tablet breaks or crumbles, throw it away and take a new tablet out of the blister pack. Place the tablet in the mouth and allow it to dissolve, and then swallow. Do not cut, crush, or chew this medication. You do not need water to take thismedication. Do not take it more often than directed. Talk to your care team regarding the use of this medication in children. While this medication may be prescribed for children as young  as 6 years for selectedconditions, precautions do apply. Overdosage: If you think you have taken too much of this medicine contact apoison control center or emergency room at once. NOTE: This medicine is only for you. Do not share this medicine with others. What if I miss a dose? This does not apply. This medication is not for regular use. What may interact with this medication? Do not take this medication with any of the following medications: Certain medications for migraine headache like almotriptan, eletriptan, frovatriptan, naratriptan, rizatriptan, sumatriptan, zolmitriptan Ergot alkaloids like dihydroergotamine, ergonovine, ergotamine, methylergonovine MAOIs like Carbex, Eldepryl, Marplan, Nardil, and Parnate This medication may also interact with the following medications: Certain medications for depression, anxiety, or psychotic disorders Propranolol This list may not describe all possible interactions. Give your health care provider a list of all the medicines, herbs, non-prescription drugs, or dietary supplements you use. Also tell them if you smoke, drink alcohol, or use illegaldrugs. Some items may interact with your medicine. What should I watch for while using this medication? Visit your care team for regular checks on your progress. Tell your care teamif your symptoms do not start to get better or if they get worse. You may get drowsy or dizzy. Do not drive, use machinery, or do anything that needs mental alertness until you know how this medication affects you. Do not stand up or sit up quickly, especially if you are an older patient. This reduces the risk of dizzy or fainting spells. Alcohol may interfere with theeffect of this medication. Your mouth may get dry. Chewing sugarless gum or sucking hard candy and drinking plenty  of water may help. Contact your care team if the problem doesnot go away or is severe. If you take migraine medications for 10 or more days a month, your  migraines may get worse. Keep a diary of headache days and medication use. Contact yourcare team if your migraine attacks occur more frequently. What side effects may I notice from receiving this medication? Side effects that you should report to your care team as soon as possible: Allergic reactions-skin rash, itching, hives, swelling of the face, lips, tongue, or throat Burning, pain, tingling, or color changes in the legs or feet Heart attack-pain or tightness in the chest, shoulders, arms, or jaw, nausea, shortness of breath, cold or clammy skin, feeling faint or lightheaded Heart rhythm changes-fast or irregular heartbeat, dizziness, feeling faint or lightheaded, chest pain, trouble breathing Increase in blood pressure Irritability, confusion, fast or irregular heartbeat, muscle stiffness, twitching muscles, sweating, high fever, seizure, chills, vomiting, diarrhea, which may be signs of serotonin syndrome Raynaud's-cool, numb, or painful fingers or toes that may change color from pale, to blue, to red Seizures Stroke-sudden numbness or weakness of the face, arm, or leg, trouble speaking, confusion, trouble walking, loss of balance or coordination, dizziness, severe headache, change in vision Sudden or severe stomach pain, nausea, vomiting, fever, or bloody diarrhea Vision loss Side effects that usually do not require medical attention (report to your careteam if they continue or are bothersome): Dizziness General discomfort or fatigue This list may not describe all possible side effects. Call your doctor for medical advice about side effects. You may report side effects to FDA at1-800-FDA-1088. Where should I keep my medication? Keep out of the reach of children and pets. Store at room temperature between 15 and 30 degrees C (59 and 86 degrees F). Protect from light and moisture. Throw away any unused medication after theexpiration date. NOTE: This sheet is a summary. It may not cover all  possible information. If you have questions about this medicine, talk to your doctor, pharmacist, orhealth care provider.  2022 Elsevier/Gold Standard (2020-10-02 16:19:19) Galcanezumab injection What is this medication? GALCANEZUMAB (gal ka NEZ ue mab) is used to prevent migraines and treat cluster headaches. This medicine may be used for other purposes; ask your health care provider or pharmacist if you have questions. COMMON BRAND NAME(S): Emgality What should I tell my care team before I take this medication? They need to know if you have any of these conditions: an unusual or allergic reaction to galcanezumab, other medicines, foods, dyes, or preservatives pregnant or trying to get pregnant breast-feeding How should I use this medication? This medicine is for injection under the skin. You will be taught how to prepare and give this medicine. Use exactly as directed. Take your medicine at regular intervals. Do not take your medicine more often than directed. It is important that you put your used needles and syringes in a special sharps container. Do not put them in a trash can. If you do not have a sharps container, call your pharmacist or healthcare provider to get one. Talk to your pediatrician regarding the use of this medicine in children. Special care may be needed. Overdosage: If you think you have taken too much of this medicine contact a poison control center or emergency room at once. NOTE: This medicine is only for you. Do not share this medicine with others. What if I miss a dose? If you miss a dose, take it as soon as you can. If it is almost  time for your next dose, take only that dose. Do not take double or extra doses. What may interact with this medication? Interactions are not expected. This list may not describe all possible interactions. Give your health care provider a list of all the medicines, herbs, non-prescription drugs, or dietary supplements you use. Also tell  them if you smoke, drink alcohol, or use illegal drugs. Some items may interact with your medicine. What should I watch for while using this medication? Tell your doctor or healthcare professional if your symptoms do not start to get better or if they get worse. What side effects may I notice from receiving this medication? Side effects that you should report to your doctor or health care professional as soon as possible: allergic reactions like skin rash, itching or hives, swelling of the face, lips, or tongue Side effects that usually do not require medical attention (report these to your doctor or health care professional if they continue or are bothersome): pain, redness, or irritation at site where injected This list may not describe all possible side effects. Call your doctor for medical advice about side effects. You may report side effects to FDA at 1-800-FDA-1088. Where should I keep my medication? Keep out of the reach of children. You will be instructed on how to store this medicine. Throw away any unused medicine after the expiration date on the label. NOTE: This sheet is a summary. It may not cover all possible information. If you have questions about this medicine, talk to your doctor, pharmacist, or health care provider.  2022 Elsevier/Gold Standard (2018-02-23 12:03:23)
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
## 2021-05-19 NOTE — Progress Notes (Signed)
Emgality 240mg  /34ml  given subcutaneously per verbal order from Dr. 3m.  R upper arm, R thigh. Instructed pt and did the initial injection R arm, she followed up with the second injection into R thigh.  She verbalized understanding.  She did injection well.  Answered her questions, autoinjector to go into sharps container or hard plastic laundry container.  Instruction sheet given as well as the medication information sheet.

## 2021-05-20 ENCOUNTER — Telehealth: Payer: Self-pay | Admitting: *Deleted

## 2021-05-20 LAB — BASIC METABOLIC PANEL
BUN/Creatinine Ratio: 7 — ABNORMAL LOW (ref 9–23)
BUN: 6 mg/dL (ref 6–24)
CO2: 25 mmol/L (ref 20–29)
Calcium: 9.9 mg/dL (ref 8.7–10.2)
Chloride: 99 mmol/L (ref 96–106)
Creatinine, Ser: 0.88 mg/dL (ref 0.57–1.00)
Glucose: 57 mg/dL — ABNORMAL LOW (ref 65–99)
Potassium: 4 mmol/L (ref 3.5–5.2)
Sodium: 141 mmol/L (ref 134–144)
eGFR: 83 mL/min/{1.73_m2} (ref >59)

## 2021-05-20 LAB — TSH: TSH: 0.971 u[IU]/mL (ref 0.450–4.500)

## 2021-05-20 NOTE — Telephone Encounter (Signed)
emgality PA,  key: [id number], G43.019, tried/failed: Tylenol, Excedrin, clonidine, Cymbalta, Lexapro, Prozac, amitriptyline, gabapentin, Indocin, ibuprofen, ketorolac, Robaxin, metoprolol, naproxen, Zofran, Phenergan, sumatriptan hand, trazodone, tramadol, Topamax, rizatriptan, faxed office notes to [facility].

## 2021-05-21 NOTE — Telephone Encounter (Signed)
Per CMM, PA must be sent to [facility]. Emgailty PA form for [employer] completed, on Dr [employer] for review, signature.

## 2021-05-27 NOTE — Telephone Encounter (Signed)
[place] with GI called me and informed me the patient has medicaid [employer].. I started the auth through [facility]. I faxed notes it is pending.

## 2021-05-28 NOTE — Telephone Encounter (Signed)
Fax confirmation received.  This was terminated on [place] tracks.  Resubmitted by fax again.

## 2021-05-28 NOTE — Telephone Encounter (Signed)
Left a voicemail on [place] number of [phone] to inform her that the MRI has been approved by [facility] [employer].   Auth: [id number] (exp. 05/27/21 to 07/26/21)

## 2021-05-30 ENCOUNTER — Other Ambulatory Visit: Payer: Self-pay

## 2021-05-30 ENCOUNTER — Ambulatory Visit
Admission: RE | Admit: 2021-05-30 | Discharge: 2021-05-30 | Disposition: A | Discharge status: Home / Self Care | Payer: Self-pay | Source: Ambulatory Visit | Attending: Neurology | Admitting: Neurology

## 2021-05-30 DIAGNOSIS — H539 Unspecified visual disturbance: Secondary | ICD-10-CM

## 2021-05-30 DIAGNOSIS — R519 Headache, unspecified: Secondary | ICD-10-CM

## 2021-05-30 DIAGNOSIS — R51 Headache with orthostatic component, not elsewhere classified: Secondary | ICD-10-CM

## 2021-05-30 MED ORDER — GADOBENATE DIMEGLUMINE 529 MG/ML IV SOLN
14.0000 mL | Freq: Once | INTRAVENOUS | Status: AC | PRN
  Administered 2021-05-30: 14 mL via INTRAVENOUS

## 2021-06-01 ENCOUNTER — Emergency Department (HOSPITAL_COMMUNITY): Payer: Medicaid Other

## 2021-06-01 ENCOUNTER — Other Ambulatory Visit: Payer: Self-pay

## 2021-06-01 ENCOUNTER — Encounter (HOSPITAL_COMMUNITY): Payer: Self-pay

## 2021-06-01 ENCOUNTER — Emergency Department (HOSPITAL_COMMUNITY)
Admission: EM | Admit: 2021-06-01 | Discharge: 2021-06-01 | Disposition: A | Discharge status: Home / Self Care | Payer: Medicaid Other | Attending: Emergency Medicine | Admitting: Emergency Medicine

## 2021-06-01 DIAGNOSIS — R112 Nausea with vomiting, unspecified: Secondary | ICD-10-CM | POA: Diagnosis not present

## 2021-06-01 DIAGNOSIS — R9431 Abnormal electrocardiogram [ECG] [EKG]: Secondary | ICD-10-CM | POA: Diagnosis not present

## 2021-06-01 DIAGNOSIS — R1013 Epigastric pain: Secondary | ICD-10-CM | POA: Insufficient documentation

## 2021-06-01 LAB — CBC WITH DIFFERENTIAL/PLATELET
Abs Immature Granulocytes: 0.04 10*3/uL (ref 0.00–0.07)
Basophils Absolute: 0.1 10*3/uL (ref 0.0–0.1)
Basophils Relative: 1 %
Eosinophils Absolute: 0 10*3/uL (ref 0.0–0.5)
Eosinophils Relative: 0 %
HCT: 40.9 % (ref 36.0–46.0)
Hemoglobin: 13.4 g/dL (ref 12.0–15.0)
Immature Granulocytes: 0 %
Lymphocytes Relative: 7 %
Lymphs Abs: 0.7 10*3/uL (ref 0.7–4.0)
MCH: 30 pg (ref 26.0–34.0)
MCHC: 32.8 g/dL (ref 30.0–36.0)
MCV: 91.5 fL (ref 80.0–100.0)
Monocytes Absolute: 0.6 10*3/uL (ref 0.1–1.0)
Monocytes Relative: 6 %
Neutro Abs: 8.1 10*3/uL — ABNORMAL HIGH (ref 1.7–7.7)
Neutrophils Relative %: 86 %
Platelets: 340 10*3/uL (ref 150–400)
RBC: 4.47 MIL/uL (ref 3.87–5.11)
RDW: 14.8 % (ref 11.5–15.5)
WBC: 9.5 10*3/uL (ref 4.0–10.5)
nRBC: 0 % (ref 0.0–0.2)

## 2021-06-01 LAB — COMPREHENSIVE METABOLIC PANEL
ALT: 25 U/L (ref 0–44)
AST: 23 U/L (ref 15–41)
Albumin: 3.9 g/dL (ref 3.5–5.0)
Alkaline Phosphatase: 139 U/L — ABNORMAL HIGH (ref 38–126)
Anion gap: 10 (ref 5–15)
BUN: 9 mg/dL (ref 6–20)
CO2: 26 mmol/L (ref 22–32)
Calcium: 9.9 mg/dL (ref 8.9–10.3)
Chloride: 100 mmol/L (ref 98–111)
Creatinine, Ser: 0.94 mg/dL (ref 0.44–1.00)
GFR, Estimated: >60 mL/min (ref >60)
Glucose, Bld: 95 mg/dL (ref 70–99)
Potassium: 3.6 mmol/L (ref 3.5–5.1)
Sodium: 136 mmol/L (ref 135–145)
Total Bilirubin: 0.8 mg/dL (ref 0.3–1.2)
Total Protein: 6.9 g/dL (ref 6.5–8.1)

## 2021-06-01 LAB — URINALYSIS, ROUTINE W REFLEX MICROSCOPIC
Bilirubin Urine: NEGATIVE
Glucose, UA: NEGATIVE mg/dL
Hgb urine dipstick: NEGATIVE
Ketones, ur: 15 mg/dL — AB
Leukocytes,Ua: NEGATIVE
Nitrite: NEGATIVE
Protein, ur: NEGATIVE mg/dL
Specific Gravity, Urine: <1.005 — ABNORMAL LOW (ref 1.005–1.030)
pH: 6.5 (ref 5.0–8.0)

## 2021-06-01 LAB — LIPASE, BLOOD: Lipase: 20 U/L (ref 11–51)

## 2021-06-01 LAB — TROPONIN I (HIGH SENSITIVITY)
Troponin I (High Sensitivity): 4 ng/L (ref <18)
Troponin I (High Sensitivity): 4 ng/L (ref <18)

## 2021-06-01 LAB — LACTIC ACID, PLASMA: Lactic Acid, Venous: 1 mmol/L (ref 0.5–1.9)

## 2021-06-01 MED ORDER — PANTOPRAZOLE SODIUM 40 MG PO TBEC: 40.0000 mg | DELAYED_RELEASE_TABLET | Freq: Every day | ORAL | 0 refills | Status: AC

## 2021-06-01 MED ORDER — ONDANSETRON HCL 4 MG/2ML IJ SOLN
4.0000 mg | Freq: Once | INTRAMUSCULAR | Status: AC
  Administered 2021-06-01: 4 mg via INTRAVENOUS
  Filled 2021-06-01: qty 2

## 2021-06-01 MED ORDER — SODIUM CHLORIDE 0.9 % IV BOLUS
1000.0000 mL | Freq: Once | INTRAVENOUS | Status: AC
  Administered 2021-06-01: 1000 mL via INTRAVENOUS

## 2021-06-01 MED ORDER — MORPHINE SULFATE (PF) 4 MG/ML IV SOLN
4.0000 mg | Freq: Once | INTRAVENOUS | Status: AC
Start: 2021-06-01 — End: 2021-06-01
  Administered 2021-06-01: 4 mg via INTRAVENOUS
  Filled 2021-06-01: qty 1

## 2021-06-01 MED ORDER — OXYCODONE-ACETAMINOPHEN 5-325 MG PO TABS
1.0000 | ORAL_TABLET | Freq: Once | ORAL | Status: AC
  Administered 2021-06-01: 1 via ORAL
  Filled 2021-06-01: qty 1

## 2021-06-01 MED ORDER — IOHEXOL 350 MG/ML SOLN
100.0000 mL | Freq: Once | INTRAVENOUS | Status: AC | PRN
  Administered 2021-06-01: 100 mL via INTRAVENOUS

## 2021-06-01 MED ORDER — DICYCLOMINE HCL 20 MG PO TABS: 20.0000 mg | ORAL_TABLET | Freq: Three times a day (TID) | ORAL | 0 refills | Status: DC | PRN

## 2021-06-01 MED ORDER — LORAZEPAM 2 MG/ML IJ SOLN
1.0000 mg | Freq: Once | INTRAMUSCULAR | Status: AC
  Administered 2021-06-01: 1 mg via INTRAVENOUS
  Filled 2021-06-01: qty 1

## 2021-06-01 NOTE — ED Notes (Signed)
Pt continues to ask for more pain medication. No orders available, pt has been informed of same
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
## 2021-06-01 NOTE — ED Notes (Signed)
E-signature pad unavailable at time of pt discharge. This RN discussed discharge materials with pt and answered all pt questions. Pt stated understanding of discharge material. ? ?

## 2021-06-01 NOTE — ED Triage Notes (Signed)
Patient complains of pancreas pain and possible UTI. Reports 2 days of pain and dysuria with frequency. Patient has hx of pancreatitis. No vomiting, no diarrhea

## 2021-06-01 NOTE — Discharge Instructions (Addendum)

## 2021-06-01 NOTE — ED Notes (Addendum)
Pt inquired about wait time and is upset. Pt continuously is using profanity toward this tech. Pt is demanding to be seen.

## 2021-06-01 NOTE — ED Provider Notes (Signed)
Emergency Medicine Provider Triage Evaluation Note  Tricia Brown , a 45 y.o. female  was evaluated in triage.  Pt complains of epigastric pain, right lower abdominal pain, lower abdominal pain, dysuria, urinary frequency and urgency x2 days.  History of pancreatitis.  Nausea without vomiting or diarrhea.  .  Review of Systems  Positive: Fevers, chills, abdominal pain, dysuria, urinary frequency and urgency Negative: Chest pain, shortness of breath, palpitations, diarrhea, vomiting  Physical Exam  BP (!) 149/73 (BP Location: Left Arm)   Pulse (!) 117   Temp 98.4 F (36.9 C) (Oral)   Resp 20   SpO2 100%  Gen:   Awake, no distress   Resp:  Normal effort  MSK:   Moves extremities without difficulty  Other:  Epigastric and right lower abdominal tenderness palpation as well as suprapubic tenderness palpation.  Tachycardic with regular rhythm.  No M/R/G .  Medical Decision Making  Medically screening exam initiated at 12:47 PM.  Appropriate orders placed.  Tricia Brown was informed that the remainder of the evaluation will be completed by another provider, this initial triage assessment does not replace that evaluation, and the importance of remaining in the ED until their evaluation is complete.  This chart was dictated using voice recognition software, Dragon. Despite the best efforts of this provider to proofread and correct errors, errors may still occur which can change documentation meaning.    Sherrilee Gilles 06/01/21 1248    Tricia Repress, MD 06/04/21 [phone]

## 2021-06-02 LAB — URINE CULTURE

## 2021-06-05 NOTE — Telephone Encounter (Signed)
I called and spoke to representative in [facility] managed care for medicaid.  Refaxed form to them at [phone].,  [phone].

## 2021-06-06 LAB — CULTURE, BLOOD (ROUTINE X 2)
Culture: NO GROWTH
Special Requests: ADEQUATE

## 2021-06-08 ENCOUNTER — Inpatient Hospital Stay (HOSPITAL_COMMUNITY)
Admission: EM | Admit: 2021-06-08 | Discharge: 2021-06-11 | DRG: 641 | Disposition: A | Discharge status: Home / Self Care | Payer: Medicaid Other | Attending: Family Medicine | Admitting: Family Medicine

## 2021-06-08 ENCOUNTER — Emergency Department (HOSPITAL_COMMUNITY): Payer: Medicaid Other

## 2021-06-08 ENCOUNTER — Encounter (HOSPITAL_COMMUNITY): Payer: Self-pay | Admitting: Emergency Medicine

## 2021-06-08 ENCOUNTER — Other Ambulatory Visit: Payer: Self-pay

## 2021-06-08 DIAGNOSIS — Z87410 Personal history of cervical dysplasia: Secondary | ICD-10-CM

## 2021-06-08 DIAGNOSIS — N179 Acute kidney failure, unspecified: Secondary | ICD-10-CM | POA: Diagnosis present

## 2021-06-08 DIAGNOSIS — N390 Urinary tract infection, site not specified: Secondary | ICD-10-CM | POA: Diagnosis present

## 2021-06-08 DIAGNOSIS — F0390 Unspecified dementia without behavioral disturbance: Secondary | ICD-10-CM | POA: Diagnosis present

## 2021-06-08 DIAGNOSIS — R4182 Altered mental status, unspecified: Secondary | ICD-10-CM

## 2021-06-08 DIAGNOSIS — F13239 Sedative, hypnotic or anxiolytic dependence with withdrawal, unspecified: Secondary | ICD-10-CM | POA: Diagnosis present

## 2021-06-08 DIAGNOSIS — Z20822 Contact with and (suspected) exposure to covid-19: Secondary | ICD-10-CM | POA: Diagnosis present

## 2021-06-08 DIAGNOSIS — Z23 Encounter for immunization: Secondary | ICD-10-CM | POA: Diagnosis not present

## 2021-06-08 DIAGNOSIS — Z79899 Other long term (current) drug therapy: Secondary | ICD-10-CM

## 2021-06-08 DIAGNOSIS — E512 Wernicke's encephalopathy: Secondary | ICD-10-CM | POA: Diagnosis present

## 2021-06-08 DIAGNOSIS — R1030 Lower abdominal pain, unspecified: Secondary | ICD-10-CM

## 2021-06-08 DIAGNOSIS — F329 Major depressive disorder, single episode, unspecified: Secondary | ICD-10-CM | POA: Diagnosis not present

## 2021-06-08 DIAGNOSIS — Z638 Other specified problems related to primary support group: Secondary | ICD-10-CM | POA: Diagnosis not present

## 2021-06-08 DIAGNOSIS — F3130 Bipolar disorder, current episode depressed, mild or moderate severity, unspecified: Secondary | ICD-10-CM | POA: Diagnosis present

## 2021-06-08 DIAGNOSIS — K861 Other chronic pancreatitis: Secondary | ICD-10-CM | POA: Diagnosis present

## 2021-06-08 DIAGNOSIS — R569 Unspecified convulsions: Secondary | ICD-10-CM | POA: Diagnosis present

## 2021-06-08 DIAGNOSIS — Z720 Tobacco use: Secondary | ICD-10-CM | POA: Diagnosis not present

## 2021-06-08 DIAGNOSIS — F1020 Alcohol dependence, uncomplicated: Secondary | ICD-10-CM | POA: Diagnosis present

## 2021-06-08 DIAGNOSIS — G253 Myoclonus: Secondary | ICD-10-CM | POA: Diagnosis present

## 2021-06-08 DIAGNOSIS — Z634 Disappearance and death of family member: Secondary | ICD-10-CM

## 2021-06-08 DIAGNOSIS — G2581 Restless legs syndrome: Secondary | ICD-10-CM | POA: Diagnosis present

## 2021-06-08 DIAGNOSIS — F1010 Alcohol abuse, uncomplicated: Secondary | ICD-10-CM | POA: Diagnosis present

## 2021-06-08 DIAGNOSIS — J9811 Atelectasis: Secondary | ICD-10-CM | POA: Diagnosis present

## 2021-06-08 DIAGNOSIS — T50901A Poisoning by unspecified drugs, medicaments and biological substances, accidental (unintentional), initial encounter: Secondary | ICD-10-CM | POA: Diagnosis present

## 2021-06-08 DIAGNOSIS — F410 Panic disorder [episodic paroxysmal anxiety] without agoraphobia: Secondary | ICD-10-CM | POA: Diagnosis present

## 2021-06-08 DIAGNOSIS — R1115 Cyclical vomiting syndrome unrelated to migraine: Secondary | ICD-10-CM | POA: Diagnosis not present

## 2021-06-08 DIAGNOSIS — Z8614 Personal history of Methicillin resistant Staphylococcus aureus infection: Secondary | ICD-10-CM

## 2021-06-08 DIAGNOSIS — Z90722 Acquired absence of ovaries, bilateral: Secondary | ICD-10-CM

## 2021-06-08 DIAGNOSIS — K219 Gastro-esophageal reflux disease without esophagitis: Secondary | ICD-10-CM | POA: Diagnosis present

## 2021-06-08 DIAGNOSIS — G8929 Other chronic pain: Secondary | ICD-10-CM | POA: Diagnosis present

## 2021-06-08 DIAGNOSIS — K859 Acute pancreatitis without necrosis or infection, unspecified: Secondary | ICD-10-CM

## 2021-06-08 DIAGNOSIS — R260 Ataxic gait: Secondary | ICD-10-CM | POA: Diagnosis present

## 2021-06-08 DIAGNOSIS — H5500 Unspecified nystagmus: Secondary | ICD-10-CM | POA: Diagnosis present

## 2021-06-08 DIAGNOSIS — N3000 Acute cystitis without hematuria: Secondary | ICD-10-CM | POA: Diagnosis not present

## 2021-06-08 DIAGNOSIS — F1120 Opioid dependence, uncomplicated: Secondary | ICD-10-CM | POA: Diagnosis present

## 2021-06-08 DIAGNOSIS — K580 Irritable bowel syndrome with diarrhea: Secondary | ICD-10-CM | POA: Diagnosis present

## 2021-06-08 DIAGNOSIS — R270 Ataxia, unspecified: Secondary | ICD-10-CM | POA: Diagnosis not present

## 2021-06-08 DIAGNOSIS — T50991A Poisoning by other drugs, medicaments and biological substances, accidental (unintentional), initial encounter: Secondary | ICD-10-CM

## 2021-06-08 DIAGNOSIS — G9340 Encephalopathy, unspecified: Secondary | ICD-10-CM

## 2021-06-08 DIAGNOSIS — R21 Rash and other nonspecific skin eruption: Secondary | ICD-10-CM | POA: Diagnosis present

## 2021-06-08 DIAGNOSIS — Z7951 Long term (current) use of inhaled steroids: Secondary | ICD-10-CM

## 2021-06-08 DIAGNOSIS — R278 Other lack of coordination: Secondary | ICD-10-CM | POA: Diagnosis present

## 2021-06-08 DIAGNOSIS — G252 Other specified forms of tremor: Secondary | ICD-10-CM | POA: Diagnosis present

## 2021-06-08 DIAGNOSIS — Z90710 Acquired absence of both cervix and uterus: Secondary | ICD-10-CM

## 2021-06-08 DIAGNOSIS — Z833 Family history of diabetes mellitus: Secondary | ICD-10-CM

## 2021-06-08 DIAGNOSIS — J45909 Unspecified asthma, uncomplicated: Secondary | ICD-10-CM | POA: Diagnosis present

## 2021-06-08 LAB — CBC WITH DIFFERENTIAL/PLATELET
Abs Immature Granulocytes: 0.05 10*3/uL (ref 0.00–0.07)
Basophils Absolute: 0 10*3/uL (ref 0.0–0.1)
Basophils Relative: 0 %
Eosinophils Absolute: 0 10*3/uL (ref 0.0–0.5)
Eosinophils Relative: 0 %
HCT: 39.2 % (ref 36.0–46.0)
Hemoglobin: 12.7 g/dL (ref 12.0–15.0)
Immature Granulocytes: 1 %
Lymphocytes Relative: 6 %
Lymphs Abs: 0.7 10*3/uL (ref 0.7–4.0)
MCH: 29.9 pg (ref 26.0–34.0)
MCHC: 32.4 g/dL (ref 30.0–36.0)
MCV: 92.2 fL (ref 80.0–100.0)
Monocytes Absolute: 0.4 10*3/uL (ref 0.1–1.0)
Monocytes Relative: 4 %
Neutro Abs: 9.4 10*3/uL — ABNORMAL HIGH (ref 1.7–7.7)
Neutrophils Relative %: 89 %
Platelets: 265 10*3/uL (ref 150–400)
RBC: 4.25 MIL/uL (ref 3.87–5.11)
RDW: 15.1 % (ref 11.5–15.5)
WBC: 10.6 10*3/uL — ABNORMAL HIGH (ref 4.0–10.5)
nRBC: 0 % (ref 0.0–0.2)

## 2021-06-08 LAB — COMPREHENSIVE METABOLIC PANEL
ALT: 20 U/L (ref 0–44)
AST: 21 U/L (ref 15–41)
Albumin: 3.6 g/dL (ref 3.5–5.0)
Alkaline Phosphatase: 127 U/L — ABNORMAL HIGH (ref 38–126)
Anion gap: 9 (ref 5–15)
BUN: <5 mg/dL — ABNORMAL LOW (ref 6–20)
CO2: 23 mmol/L (ref 22–32)
Calcium: 9 mg/dL (ref 8.9–10.3)
Chloride: 108 mmol/L (ref 98–111)
Creatinine, Ser: 1.12 mg/dL — ABNORMAL HIGH (ref 0.44–1.00)
GFR, Estimated: >60 mL/min (ref >60)
Glucose, Bld: 101 mg/dL — ABNORMAL HIGH (ref 70–99)
Potassium: 3.8 mmol/L (ref 3.5–5.1)
Sodium: 140 mmol/L (ref 135–145)
Total Bilirubin: 0.1 mg/dL — ABNORMAL LOW (ref 0.3–1.2)
Total Protein: 6.2 g/dL — ABNORMAL LOW (ref 6.5–8.1)

## 2021-06-08 LAB — PROTIME-INR
INR: 1 (ref 0.8–1.2)
Prothrombin Time: 13.1 seconds (ref 11.4–15.2)

## 2021-06-08 LAB — RAPID URINE DRUG SCREEN, HOSP PERFORMED
Amphetamines: NOT DETECTED
Barbiturates: NOT DETECTED
Benzodiazepines: NOT DETECTED
Cocaine: NOT DETECTED
Opiates: NOT DETECTED
Tetrahydrocannabinol: NOT DETECTED

## 2021-06-08 LAB — LACTIC ACID, PLASMA: Lactic Acid, Venous: 1.1 mmol/L (ref 0.5–1.9)

## 2021-06-08 LAB — AMMONIA: Ammonia: 21 umol/L (ref 9–35)

## 2021-06-08 LAB — MAGNESIUM: Magnesium: 2 mg/dL (ref 1.7–2.4)

## 2021-06-08 LAB — APTT: aPTT: 26 seconds (ref 24–36)

## 2021-06-08 LAB — LIPASE, BLOOD: Lipase: 18 U/L (ref 11–51)

## 2021-06-08 LAB — CBG MONITORING, ED: Glucose-Capillary: 83 mg/dL (ref 70–99)

## 2021-06-08 LAB — ETHANOL: Alcohol, Ethyl (B): <10 mg/dL (ref <10)

## 2021-06-08 MED ORDER — THIAMINE HCL 100 MG/ML IJ SOLN: 500.0000 mg | Freq: Once | INTRAMUSCULAR | Status: DC

## 2021-06-08 MED ORDER — LORAZEPAM 2 MG/ML IJ SOLN: 1.0000 mg | Freq: Once | INTRAMUSCULAR | Status: DC

## 2021-06-08 MED ORDER — THIAMINE HCL 100 MG/ML IJ SOLN
500.0000 mg | Freq: Once | INTRAVENOUS | Status: AC
  Administered 2021-06-08: 500 mg via INTRAVENOUS
  Filled 2021-06-08: qty 5

## 2021-06-08 MED ORDER — THIAMINE HCL 100 MG/ML IJ SOLN: 100.0000 mg | Freq: Every day | INTRAMUSCULAR | Status: DC

## 2021-06-08 MED ORDER — THIAMINE HCL 100 MG/ML IJ SOLN
250.0000 mg | Freq: Every day | INTRAVENOUS | Status: DC
  Filled 2021-06-08: qty 2.5

## 2021-06-08 MED ORDER — SODIUM CHLORIDE 0.9 % IV SOLN: INTRAVENOUS | Status: DC

## 2021-06-08 MED ORDER — THIAMINE HCL 100 MG/ML IJ SOLN: 100.0000 mg | Freq: Once | INTRAMUSCULAR | Status: DC

## 2021-06-08 MED ORDER — IOHEXOL 350 MG/ML SOLN
75.0000 mL | Freq: Once | INTRAVENOUS | Status: AC | PRN
  Administered 2021-06-08: 75 mL via INTRAVENOUS

## 2021-06-08 MED ORDER — ENOXAPARIN SODIUM 40 MG/0.4ML IJ SOSY
40.0000 mg | PREFILLED_SYRINGE | INTRAMUSCULAR | Status: DC
  Administered 2021-06-08 – 2021-06-10 (×3): 40 mg via SUBCUTANEOUS
  Filled 2021-06-08 (×3): qty 0.4

## 2021-06-08 MED ORDER — LORAZEPAM 2 MG/ML IJ SOLN
1.0000 mg | Freq: Once | INTRAMUSCULAR | Status: AC
  Administered 2021-06-08: 1 mg via INTRAVENOUS
  Filled 2021-06-08: qty 1

## 2021-06-08 MED ORDER — NICOTINE 14 MG/24HR TD PT24
14.0000 mg | MEDICATED_PATCH | Freq: Every day | TRANSDERMAL | Status: DC
  Administered 2021-06-08 – 2021-06-10 (×2): 14 mg via TRANSDERMAL
  Filled 2021-06-08 (×3): qty 1

## 2021-06-08 MED ORDER — THIAMINE HCL 100 MG/ML IJ SOLN
500.0000 mg | Freq: Three times a day (TID) | INTRAMUSCULAR | Status: AC
  Administered 2021-06-09 – 2021-06-10 (×5): 500 mg via INTRAVENOUS
  Filled 2021-06-08 (×7): qty 5

## 2021-06-08 MED ORDER — LACTATED RINGERS IV BOLUS (SEPSIS)
1000.0000 mL | Freq: Once | INTRAVENOUS | Status: AC
Start: 2021-06-08 — End: 2021-06-08
  Administered 2021-06-08: 1000 mL via INTRAVENOUS

## 2021-06-08 NOTE — ED Notes (Signed)
Provider paged regarding medication for MRI

## 2021-06-08 NOTE — ED Notes (Signed)
Lab called regarding lab add on for GHB screen

## 2021-06-08 NOTE — Progress Notes (Signed)
FPTS Interim Progress Note  S:Went to assess patient at bedside for stability after receiving request for admission near sign out. Patient is resting comfortably in bed, but is highly inattentive, intermittently falling asleep mid question. Her step daughter is bedside and reports that her step mom has a long history of drug use (EtOH, opiates in particular) and has been on suboxone recently. She had an overdose on opiates earlier this year (possibly [facility] stay in Feb to March?) and has been acting intermittently encephalopathic since then but had a witnessed tonic-clonic seizure that lasted about 1 minute earlier today.  O: BP (!) 123/99   Pulse 99   Temp 98.5 F (36.9 C) (Oral)   Resp 15   SpO2 99%   Nursing note and vitals reviewed GEN: appears older than stated age, agitated, NAD, WNWD Cardiac: Regular rate and rhythm. Normal S1/S2. No murmurs, rubs, or gallops appreciated. 2+ radial pulses. Lungs: Clear bilaterally to ascultation. No increased WOB, no accessory muscle usage. No w/r/r. Abdomen: Normoactive bowel sounds. + suprapubic fullness and tender Neuro: not alert, somnolent, arouses to voice, oriented to self only, very inattentive Ext: no edema  A/P: AMS Patient will be admitted to FPTS, H&P forthcoming. VSS.   C/f urinary retention Patient has not urinated except for dribble here in ED. Reports she wants to pee, but cannot. She has suprapubic fullness on exam. Will get bladder scan and I/Ox3 with PVR prior to placing foley.  Shirlean Mylar, MD 06/08/2021, 6:56 PM PGY-3, [facility] Service pager [phone]

## 2021-06-08 NOTE — ED Provider Notes (Signed)
Emergency Medicine Provider Triage Evaluation Note  Tricia Brown , a 45 y.o. female  was evaluated in triage.  Pt complains of tremors.  Per EMS and nursing note, she had a 1 minute episode of seizure-like activity.  Per EMS, she has been tremulous since a recent OD.  Patient is mumbling during our interview and falls asleep.  Review of Systems  Positive:  Negative: Unable to get adequate ROS  Physical Exam  BP 101/73 (BP Location: Right Arm)   Pulse (!) 117   Temp 98.8 F (37.1 C) (Oral)   Resp 20   SpO2 97%  Gen:   Somnolent, no distress   Resp:  Normal effort  MSK:   Moves extremities without difficulty  Other:  Tremulous  Medical Decision Making  Medically screening exam initiated at 1:30 PM.  Appropriate orders placed.  Tricia Brown was informed that the remainder of the evaluation will be completed by another provider, this initial triage assessment does not replace that evaluation, and the importance of remaining in the ED until their evaluation is complete.     Tricia Loh Portal, PA-C 06/08/21 1332    Derwood Kaplan, MD 06/10/21 [phone]
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
## 2021-06-08 NOTE — ED Notes (Addendum)
Patient becoming increasingly restless. Requesting ativan and something for his abdominal pain. Provider paged
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
## 2021-06-08 NOTE — ED Triage Notes (Addendum)
Pt to triage via [facility].  Witnessed seizure like activity lasting approx 1 min.  No history of seizures.  Family reports tremors and rambling intermittently x 1 month since last overdose.  CBG 121.  93% RA.  Placed on 4 liters O2 via  by EMS and sats 98%.  20g LFA.  Pt with AMS- not answering questions.
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
## 2021-06-08 NOTE — ED Provider Notes (Signed)
3:18 PM BP 132/86   Pulse (!) 108   Temp 98.8 F (37.1 C) (Oral)   Resp (!) 24   SpO2 98%   Patient taken in sign out from [employer].  Patient presented Via EMS with 1 minute of witnessed seizure-like activity.  EMS reports no prior history patient's family reports that she has been rambling and having tremors intermittently for about a month since she overdosed on opiates 1 month ago.  She has a past medical history of severe alcohol dependence and opiate dependence.  On examination the patient is alert, with coarse tremors and asterixis. Speech is rapid and pressured, difficult to understand and patient appears to be confabulating words. +nystagmus, encephalopathy, and Ataxia Abrasion to the tongue   Current differential includes Wernicke's encephalopathy, alcohol withdrawal/delirium tremens, hepatic encephalopathy. Meds reviewed and patient might also have Serotonin syndrome. Work-up is currently pending.  At this point patient will receive interventions including thiamine (high dose for Wernicke's), Ativan, and fluids.   Patient seen at bedside with Neuro NP Jimmye Norman. Add'l information provided by daughter . She reports that the the patient has had progressively worsening mental decline for the past 2 years and that she and her brother have been trying to watch her as much as possible. She moved in several months ago and reports that the patient seems to have periods of lucidity alternating with confusion, however today's episode is excessive. She reports that the patient has an extensive prescription pill abuse history and believes she was not dependent on alcohol and that she is not currently drinking. She states that her mother always believes she has pancreatitis and begs for pain medication even when confused. She is frustrated that she gets opiates at the hospital having no evidence of pancreatitis and states "she is already on suboxone." She witnessed the seizure  lasting about 1 minute with full body rigidity followed by tonic clonic activity. She reports worsening twitching and myoclonic jerking over several months.   Neuro recommends MRI- Patient will likely need anesthesia involvement. I have discussed the Case with the Family medicine resident service who will admit.       Arthor Captain, PA-C 06/09/21 1002    [place], [place], MD 06/11/21 [phone]

## 2021-06-09 ENCOUNTER — Inpatient Hospital Stay (HOSPITAL_COMMUNITY): Payer: Medicaid Other

## 2021-06-09 ENCOUNTER — Encounter (HOSPITAL_COMMUNITY): Payer: Self-pay | Admitting: Family Medicine

## 2021-06-09 ENCOUNTER — Inpatient Hospital Stay (HOSPITAL_COMMUNITY): Payer: Medicaid Other | Admitting: Certified Registered"

## 2021-06-09 ENCOUNTER — Encounter (HOSPITAL_COMMUNITY)
Admission: EM | Disposition: A | Discharge status: Home / Self Care | Payer: Self-pay | Source: Home / Self Care | Attending: Family Medicine

## 2021-06-09 DIAGNOSIS — G9340 Encephalopathy, unspecified: Secondary | ICD-10-CM | POA: Diagnosis not present

## 2021-06-09 DIAGNOSIS — R4182 Altered mental status, unspecified: Secondary | ICD-10-CM | POA: Diagnosis not present

## 2021-06-09 DIAGNOSIS — R569 Unspecified convulsions: Secondary | ICD-10-CM

## 2021-06-09 HISTORY — PX: RADIOLOGY WITH ANESTHESIA: SHX6223

## 2021-06-09 LAB — SARS CORONAVIRUS 2 (TAT 6-24 HRS): SARS Coronavirus 2: NEGATIVE

## 2021-06-09 LAB — VITAMIN B12: Vitamin B-12: 602 pg/mL (ref 180–914)

## 2021-06-09 LAB — COMPREHENSIVE METABOLIC PANEL
ALT: 21 U/L (ref 0–44)
AST: 19 U/L (ref 15–41)
Albumin: 3.4 g/dL — ABNORMAL LOW (ref 3.5–5.0)
Alkaline Phosphatase: 126 U/L (ref 38–126)
Anion gap: 9 (ref 5–15)
BUN: <5 mg/dL — ABNORMAL LOW (ref 6–20)
CO2: 23 mmol/L (ref 22–32)
Calcium: 8.9 mg/dL (ref 8.9–10.3)
Chloride: 105 mmol/L (ref 98–111)
Creatinine, Ser: 1.02 mg/dL — ABNORMAL HIGH (ref 0.44–1.00)
GFR, Estimated: >60 mL/min (ref >60)
Glucose, Bld: 80 mg/dL (ref 70–99)
Potassium: 3.5 mmol/L (ref 3.5–5.1)
Sodium: 137 mmol/L (ref 135–145)
Total Bilirubin: 0.7 mg/dL (ref 0.3–1.2)
Total Protein: 5.9 g/dL — ABNORMAL LOW (ref 6.5–8.1)

## 2021-06-09 LAB — TSH: TSH: 0.744 u[IU]/mL (ref 0.350–4.500)

## 2021-06-09 SURGERY — MRI WITH ANESTHESIA
Anesthesia: General

## 2021-06-09 MED ORDER — BUPRENORPHINE HCL 8 MG SL SUBL
8.0000 mg | SUBLINGUAL_TABLET | Freq: Three times a day (TID) | SUBLINGUAL | Status: DC
  Administered 2021-06-09 (×2): 8 mg via SUBLINGUAL
  Filled 2021-06-09 (×2): qty 1

## 2021-06-09 MED ORDER — ADULT MULTIVITAMIN W/MINERALS CH
1.0000 | ORAL_TABLET | Freq: Every day | ORAL | Status: DC
  Administered 2021-06-09 – 2021-06-11 (×3): 1 via ORAL
  Filled 2021-06-09 (×3): qty 1

## 2021-06-09 MED ORDER — BUSPIRONE HCL 10 MG PO TABS
15.0000 mg | ORAL_TABLET | Freq: Three times a day (TID) | ORAL | Status: DC
  Administered 2021-06-09 – 2021-06-10 (×3): 15 mg via ORAL
  Filled 2021-06-09 (×3): qty 2

## 2021-06-09 MED ORDER — SUCRALFATE 1 G PO TABS
1.0000 g | ORAL_TABLET | Freq: Three times a day (TID) | ORAL | Status: DC
  Administered 2021-06-09 – 2021-06-11 (×6): 1 g via ORAL
  Filled 2021-06-09 (×6): qty 1

## 2021-06-09 MED ORDER — ONDANSETRON HCL 4 MG/2ML IJ SOLN: 4.0000 mg | Freq: Once | INTRAMUSCULAR | Status: DC | PRN

## 2021-06-09 MED ORDER — OXYCODONE HCL 5 MG/5ML PO SOLN
5.0000 mg | Freq: Once | ORAL | Status: DC | PRN
Start: 2021-06-09 — End: 2021-06-09

## 2021-06-09 MED ORDER — CHLORHEXIDINE GLUCONATE 0.12 % MT SOLN
OROMUCOSAL | Status: AC
  Administered 2021-06-09: 15 mL via OROMUCOSAL
  Filled 2021-06-09: qty 15

## 2021-06-09 MED ORDER — ONDANSETRON HCL 4 MG/2ML IJ SOLN
INTRAMUSCULAR | Status: DC | PRN
  Administered 2021-06-09: 4 mg via INTRAVENOUS

## 2021-06-09 MED ORDER — ARIPIPRAZOLE 10 MG PO TABS
10.0000 mg | ORAL_TABLET | Freq: Every day | ORAL | Status: DC
  Administered 2021-06-09 – 2021-06-10 (×2): 10 mg via ORAL
  Filled 2021-06-09 (×2): qty 1

## 2021-06-09 MED ORDER — DICYCLOMINE HCL 20 MG PO TABS
20.0000 mg | ORAL_TABLET | Freq: Three times a day (TID) | ORAL | Status: DC | PRN
  Administered 2021-06-10: 20 mg via ORAL
  Filled 2021-06-09 (×3): qty 1

## 2021-06-09 MED ORDER — PHENYLEPHRINE HCL-NACL 20-0.9 MG/250ML-% IV SOLN
INTRAVENOUS | Status: DC | PRN
  Administered 2021-06-09: 20 ug/min via INTRAVENOUS

## 2021-06-09 MED ORDER — MIRTAZAPINE 15 MG PO TABS
15.0000 mg | ORAL_TABLET | Freq: Every day | ORAL | Status: DC
  Administered 2021-06-09 – 2021-06-10 (×2): 15 mg via ORAL
  Filled 2021-06-09 (×2): qty 1

## 2021-06-09 MED ORDER — PROPOFOL 10 MG/ML IV BOLUS
INTRAVENOUS | Status: DC | PRN
  Administered 2021-06-09: 150 mg via INTRAVENOUS

## 2021-06-09 MED ORDER — GABAPENTIN 300 MG PO CAPS
300.0000 mg | ORAL_CAPSULE | Freq: Three times a day (TID) | ORAL | Status: DC
  Administered 2021-06-09 – 2021-06-10 (×3): 300 mg via ORAL
  Filled 2021-06-09 (×3): qty 1

## 2021-06-09 MED ORDER — CHLORHEXIDINE GLUCONATE 0.12 % MT SOLN: 15.0000 mL | Freq: Once | OROMUCOSAL | Status: AC

## 2021-06-09 MED ORDER — FOLIC ACID 1 MG PO TABS
1.0000 mg | ORAL_TABLET | Freq: Every day | ORAL | Status: DC
  Administered 2021-06-09 – 2021-06-11 (×3): 1 mg via ORAL
  Filled 2021-06-09 (×3): qty 1

## 2021-06-09 MED ORDER — GADOBUTROL 1 MMOL/ML IV SOLN
7.0000 mL | Freq: Once | INTRAVENOUS | Status: AC | PRN
  Administered 2021-06-09: 7 mL via INTRAVENOUS

## 2021-06-09 MED ORDER — LORAZEPAM 2 MG/ML IJ SOLN
1.0000 mg | Freq: Once | INTRAMUSCULAR | Status: AC
  Administered 2021-06-09: 1 mg via INTRAVENOUS
  Filled 2021-06-09: qty 1

## 2021-06-09 MED ORDER — DEXAMETHASONE SODIUM PHOSPHATE 4 MG/ML IJ SOLN
INTRAMUSCULAR | Status: DC | PRN
Start: 2021-06-09 — End: 2021-06-09
  Administered 2021-06-09: 10 mg via INTRAVENOUS

## 2021-06-09 MED ORDER — PANCRELIPASE (LIP-PROT-AMYL) 36000-114000 UNITS PO CPEP
108000.0000 [IU] | ORAL_CAPSULE | Freq: Three times a day (TID) | ORAL | Status: DC
  Administered 2021-06-09 – 2021-06-11 (×5): 108000 [IU] via ORAL
  Filled 2021-06-09 (×8): qty 3

## 2021-06-09 MED ORDER — MIDAZOLAM HCL 5 MG/5ML IJ SOLN
INTRAMUSCULAR | Status: DC | PRN
Start: 2021-06-09 — End: 2021-06-09
  Administered 2021-06-09: 2 mg via INTRAVENOUS

## 2021-06-09 MED ORDER — LACTATED RINGERS IV SOLN: INTRAVENOUS | Status: DC

## 2021-06-09 MED ORDER — ACETAMINOPHEN 160 MG/5ML PO SOLN: 325.0000 mg | ORAL | Status: DC | PRN

## 2021-06-09 MED ORDER — LIDOCAINE 2% (20 MG/ML) 5 ML SYRINGE
INTRAMUSCULAR | Status: DC | PRN
  Administered 2021-06-09: 100 mg via INTRAVENOUS

## 2021-06-09 MED ORDER — PANTOPRAZOLE SODIUM 40 MG PO TBEC
40.0000 mg | DELAYED_RELEASE_TABLET | Freq: Every day | ORAL | Status: DC
  Administered 2021-06-09 – 2021-06-11 (×3): 40 mg via ORAL
  Filled 2021-06-09 (×3): qty 1

## 2021-06-09 MED ORDER — CHLORHEXIDINE GLUCONATE 0.12 % MT SOLN
OROMUCOSAL | Status: AC
  Filled 2021-06-09: qty 15

## 2021-06-09 MED ORDER — FENTANYL CITRATE (PF) 100 MCG/2ML IJ SOLN: 25.0000 ug | INTRAMUSCULAR | Status: DC | PRN

## 2021-06-09 MED ORDER — OXYCODONE HCL 5 MG PO TABS: 5.0000 mg | ORAL_TABLET | Freq: Once | ORAL | Status: DC | PRN

## 2021-06-09 MED ORDER — ORAL CARE MOUTH RINSE: 15.0000 mL | Freq: Once | OROMUCOSAL | Status: AC

## 2021-06-09 MED ORDER — MEPERIDINE HCL 25 MG/ML IJ SOLN: 6.2500 mg | INTRAMUSCULAR | Status: DC | PRN

## 2021-06-09 MED ORDER — DULOXETINE HCL 60 MG PO CPEP
60.0000 mg | ORAL_CAPSULE | Freq: Every day | ORAL | Status: DC
  Administered 2021-06-09 – 2021-06-10 (×2): 60 mg via ORAL
  Filled 2021-06-09 (×2): qty 1

## 2021-06-09 MED ORDER — INFLUENZA VAC SPLIT QUAD 0.5 ML IM SUSY
0.5000 mL | PREFILLED_SYRINGE | INTRAMUSCULAR | Status: AC
  Administered 2021-06-10: 0.5 mL via INTRAMUSCULAR
  Filled 2021-06-09: qty 0.5

## 2021-06-09 MED ORDER — QUETIAPINE FUMARATE 50 MG PO TABS
50.0000 mg | ORAL_TABLET | Freq: Once | ORAL | Status: AC
  Administered 2021-06-09: 50 mg via ORAL
  Filled 2021-06-09 (×2): qty 1

## 2021-06-09 MED ORDER — ACETAMINOPHEN 325 MG PO TABS: 325.0000 mg | ORAL_TABLET | ORAL | Status: DC | PRN

## 2021-06-09 MED ORDER — ACETAMINOPHEN 325 MG PO TABS
650.0000 mg | ORAL_TABLET | Freq: Four times a day (QID) | ORAL | Status: DC | PRN
  Administered 2021-06-09 – 2021-06-11 (×4): 650 mg via ORAL
  Filled 2021-06-09 (×3): qty 2

## 2021-06-09 MED ORDER — QUETIAPINE FUMARATE ER 400 MG PO TB24
400.0000 mg | ORAL_TABLET | Freq: Every day | ORAL | Status: DC
  Administered 2021-06-10 (×2): 400 mg via ORAL
  Filled 2021-06-09 (×4): qty 1

## 2021-06-09 NOTE — Anesthesia Postprocedure Evaluation (Signed)
Anesthesia Post Note  Patient: Tricia Brown  Procedure(s) Performed: MRI WITH ANESTHESIA     Patient location during evaluation: PACU Anesthesia Type: General Level of consciousness: awake and alert Pain management: pain level controlled Vital Signs Assessment: post-procedure vital signs reviewed and stable Respiratory status: spontaneous breathing, nonlabored ventilation, respiratory function stable and patient connected to nasal cannula oxygen Cardiovascular status: blood pressure returned to baseline and stable Postop Assessment: no apparent nausea or vomiting Anesthetic complications: no   No notable events documented.  Last Vitals:  Vitals:   06/09/21 1222 06/09/21 1237  BP: 120/78 137/85  Pulse: 81 89  Resp: 15 18  Temp:  36.7 C  SpO2: 98% 100%    Last Pain:  Vitals:   06/09/21 1237  TempSrc:   PainSc: 0-No pain                 Lupe Handley

## 2021-06-09 NOTE — Progress Notes (Signed)
EEG complete - results pending 

## 2021-06-09 NOTE — Progress Notes (Signed)
Family Medicine Teaching Service Daily Progress Note Intern Pager: [phone]  Patient name: Tricia Brown Medical record number: [id number] Date of birth: 1976-04-15 Age: 45 y.o. Gender: female  Primary Care Provider: [facility]: Neuro Code Status: FULL  Pt Overview and Major Events to Date:  9/18: Admitted  Assessment and Plan: The patient is a 45 year old female presenting with altered mental status s/p tonic-clonic seizure.  Past medical history of alcohol and opioid abuse, pancreatitis, necrotizing pancreatitis with recent 90-month stay at [facility], pancreatic pseudocyst s/p cystogastrostomy in 2021, DVT on AC, depression, IBS, bipolar disorder per daughter, tobacco abuse, and chronic back pain.  Altered mental status  1 minute tonic clonic seizure Patient presented after a 1 minute witnessed tonic-clonic seizure in the context of 1 month of waxing and waning mentation.  Patient found to have nystagmus, ataxia, tremulousness in the ED. Given 1 dose of thiamine 500 mg, with resolution of nystagmus.  Negative UDS and EtOH. On initial assessment by family medicine team, patient was disoriented with disorganized speech but able to follow commands.  Labs notable for leukocytosis at 10.6, negative CXR, UA pending, Bcx collected. B12 level of 602. Ddx for month of AMS includes Wernicke-Korsakoff, infection, or longstanding substance use. Wernicke-Korsakoff makes the most sense currently given presentation with nystagmus, ataxia, and AMS, and improvement in nystagmus with thiamine. The MRI-brain did not find any characteristic changes associated with Wernicke's. As far as the etiology of her seizure goes, she could have ingested a synthetic substance that did not show up on the UDS or she could be experiencing alcohol w/d, low concern for a primary epileptic disorder at this time. Of note, patient has received Ativan 1 mg x3 since arrival and this could have treated potential alcohol  w/d. Overnight events notable for an episode of agitation, for which night team gave 50 mg Seroquel with subsequent improvement. On assessment this morning, patient is alert and oriented to person and place, but not date. She is able to follow commands. When asked about current events she makes odd statements (and may be confabulating).  -Neuro consult -Thiamine 500 mg q8hrs through 9/20, 250 mg daily (9/21-9/27), 100 mg (9/27-) -Multivitamin for folate -CIWA and COWS -Ativan 2 mg IV for seizure over 5 mins -No AED yet per neuro recs -GHB screen -Lactate -EEG -cardiac monitoring  AKI (resolved) Cr 1.12 on admission, baseline of 0.8. Likely elevated d/t urinary retention. BS showed 500 cc, patient urinated 425 cc. AM Cr of 1.02 -Follow with daily BMPs  Mood disorder, unspecified -Will restart home meds today pending med rec  Hx of severe pancreatitis Patient has multiple admissions for pancreatitis, pancreatic pseudocyst, and necrotizing pancreatitis with recent 2 month stay at [facility]. Lipase and LFTs wnl.    GERD Per chart review, patient has history of reflux -continue Protonix when taking oral medications   Tobacco use disorder -Nicotine patch 14 mg   Asthma Patient taking Symbicort 80-4.5, and Proair HFA -restart pending med rec   FEN/GI: NPO until swallow eval PPx: Lovenox Dispo: TBD given AMS  Subjective:  On assessment this morning, patient is alert and oriented to person and place, but not date. She is able to follow commands. When asked about current events she makes odd statements (and may be confabulating). She reports some mild, lower ABD pain. There is tenderness to palpation over this area. The patient reports an Hx of endometriosis and thinks this maybe the cause. There was concern for urinary retention previously but the patient  voided well.   Objective: Temp:  [98.5 F (36.9 C)-98.8 F (37.1 C)] 98.5 F (36.9 C) (09/18 1731) Pulse Rate:  [87-118] 93 (09/19  0600) Resp:  [14-25] 21 (09/19 0600) BP: (101-140)/(66-114) 125/81 (09/19 0600) SpO2:  [96 %-100 %] 100 % (09/19 0600) Physical Exam Vitals reviewed.  Cardiovascular:     Rate and Rhythm: Normal rate and regular rhythm.     Heart sounds: No murmur heard. Pulmonary:     Effort: Pulmonary effort is normal.     Breath sounds: Normal breath sounds.  Abdominal:     General: Bowel sounds are normal. There is no distension.     Palpations: Abdomen is soft.  Neurological:     Mental Status: She is alert. She is disoriented.     Laboratory: Recent Labs  Lab 06/08/21 1329  WBC 10.6*  HGB 12.7  HCT 39.2  PLT 265   Recent Labs  Lab 06/08/21 1329 06/09/21 0400  NA 140 137  K 3.8 3.5  CL 108 105  CO2 23 23  BUN <5* <5*  CREATININE 1.12* 1.02*  CALCIUM 9.0 8.9  PROT 6.2* 5.9*  BILITOT 0.1* 0.7  ALKPHOS 127* 126  ALT 20 21  AST 21 19  GLUCOSE 101* 80    Imaging/Diagnostic Tests: MRI-brain as above, other studies as above  Carlyn Reichert PGY-1, Psychiatry FPTS Intern pager: [phone], text pages welcome

## 2021-06-09 NOTE — ED Notes (Signed)
Family Practice at bedside, ready to transport to OR when complete.

## 2021-06-09 NOTE — Anesthesia Procedure Notes (Signed)
Procedure Name: LMA Insertion Date/Time: 06/09/2021 10:45 AM Performed by: Macie Burows, CRNA Pre-anesthesia Checklist: Patient identified, Emergency Drugs available, Suction available, Patient being monitored and Timeout performed Patient Re-evaluated:Patient Re-evaluated prior to induction Oxygen Delivery Method: Circle system utilized Preoxygenation: Pre-oxygenation with 100% oxygen Induction Type: IV induction Ventilation: Mask ventilation without difficulty LMA: LMA inserted LMA Size: 4.0 Placement Confirmation: positive ETCO2 and breath sounds checked- equal and bilateral Tube secured with: Tape Dental Injury: Teeth and Oropharynx as per pre-operative assessment

## 2021-06-09 NOTE — H&P (Signed)
FPTS H&P Note  S: On assessment this morning the patient is alert and communicative.  Alert and oriented to person place but not time.  She is able to recall events that led to her hospitalization including the fact that she was found unresponsive.  She appears to confabulate when asked certain questions and makes bizarre statements.  The patient is not a reliable historian.  Review of systems is notable for mild abdominal/suprapubic pain.  Negative for chest pain and shortness of breath.  O: BP 125/81   Pulse 93   Temp 98.5 F (36.9 C) (Oral)   Resp (!) 21   SpO2 100%   Nursing note and vitals reviewed GEN: appears older than stated age, [employer], agitated with uncoordinated movements, NAD, WNWD HEENT: NCAT. PERRLA. Sclera without injection or icterus. MMM.  Neck: Supple.  Cardiac: Regular rate and rhythm. Normal S1/S2. No murmurs, rubs, or gallops appreciated. 2+ radial pulses. Lungs: Clear bilaterally to ascultation. No increased WOB, no accessory muscle usage. No w/r/r. Abdomen: Normoactive bowel sounds. No tenderness to deep or light palpation. No rebound or guarding.    Neuro: alert, oriented to self  Ext: no edema  A/P: AMS 45 yo woman presents with AMS, needs MR brain with sedation as she is unable to tolerate exam. Anesthesia will assist with sedation for procedure. This note to serve for their purposes for procedure. See H&P from 9/18 for further details.  Carlyn Reichert PGY-1, [facility] Service pager [phone]

## 2021-06-09 NOTE — Progress Notes (Addendum)
Neurology Progress Note  S: States she is hungry, but then has a little nausea and can not eat much. C/o abdominal pain, mild. Took a shower which made her feel better. States she feels a little more calm today and more clear headed. She continues to tell stories that are obviously not true (states she met 2 friends out in the parking lot last night and someone sat by her bed so she wouldn't fall out last night). RN says she has been pacing in the hallway almost all day. She is s/p MRI brain under anesthesia.   O: Current vital signs: BP 124/79 (BP Location: Left Arm)   Pulse 88   Temp 98 F (36.7 C) (Oral)   Resp 18   Ht _0  (1.702 m)   Wt 73.4 kg   SpO2 100%   BMI 25.34 kg/m  Vital signs in last 24 hours: Temp:  [98 F (36.7 C)-98.3 F (36.8 C)] 98 F (36.7 C) (09/19 1618) Pulse Rate:  [81-99] 88 (09/19 1618) Resp:  [14-25] 18 (09/19 1618) BP: (102-137)/(67-106) 124/79 (09/19 1618) SpO2:  [96 %-100 %] 100 % (09/19 1618) Weight:  [73.4 kg] 73.4 kg (09/19 0950)  GENERAL: well appearing. Awake, alert in NAD. HEENT: Normocephalic and atraumatic. LUNGS: Normal respiratory effort.  CV: RRR on tele.  Ext: warm.  NEURO:  Mental Status: Alert. Oriented to self, place, month, year, but not day or date. Follows commands.  Speech/Language: speech is without aphasia or dysarthria. Speech continues to be disorganized and pressured, but has improved since yesterday.  Cranial Nerves:  Up in room. Fidgeting with plastic bag. Has clothes on with tele monitor in the pocket of her shorts. Hearing intact. Smile is symmetrical. No chewing motions with mouth noted today.   Medications  Current Facility-Administered Medications:    0.9 %  sodium chloride infusion, , Intravenous, Continuous, Sowell, Brandon, MD, Last Rate: 100 mL/hr at 06/09/21 1614, New Bag at 06/09/21 1614   acetaminophen (TYLENOL) tablet 650 mg, 650 mg, Oral, Q6H PRN, Corky Sox, MD, 650 mg at 06/09/21 1553    ARIPiprazole (ABILIFY) tablet 10 mg, 10 mg, Oral, Daily, Corky Sox, MD, 10 mg at 06/09/21 1553   buprenorphine (SUBUTEX) sublingual tablet 8 mg, 8 mg, Sublingual, TID, Corky Sox, MD, 8 mg at 06/09/21 1613   busPIRone (BUSPAR) tablet 15 mg, 15 mg, Oral, TID, Corky Sox, MD, 15 mg at 06/09/21 1554   chlorhexidine (PERIDEX) 0.12 % solution, , , ,    dicyclomine (BENTYL) tablet 20 mg, 20 mg, Oral, TID PRN, Corky Sox, MD   DULoxetine (CYMBALTA) DR capsule 60 mg, 60 mg, Oral, Daily, Corky Sox, MD, 60 mg at 06/09/21 1553   enoxaparin (LOVENOX) injection 40 mg, 40 mg, Subcutaneous, Q24H, Sowell, Brandon, MD, 40 mg at 83/29/19 1660   folic acid (FOLVITE) tablet 1 mg, 1 mg, Oral, Daily, Corky Sox, MD, 1 mg at 06/09/21 1553   gabapentin (NEURONTIN) capsule 300 mg, 300 mg, Oral, TID, Corky Sox, MD, 300 mg at 06/09/21 1553   lipase/protease/amylase (CREON) capsule 108,000 Units, 108,000 Units, Oral, TID WC, Corky Sox, MD, 108,000 Units at 06/09/21 1553   mirtazapine (REMERON) tablet 15 mg, 15 mg, Oral, QHS, Corky Sox, MD   multivitamin with minerals tablet 1 tablet, 1 tablet, Oral, Daily, Corky Sox, MD, 1 tablet at 06/09/21 1554   nicotine (NICODERM CQ - dosed in mg/24 hours) patch 14 mg, 14 mg, Transdermal, Daily, Holley Bouche, MD, 14 mg at 06/08/21 2214  pantoprazole (PROTONIX) EC tablet 40 mg, 40 mg, Oral, Daily, Corky Sox, MD, 40 mg at 06/09/21 1554   QUEtiapine (SEROQUEL XR) 24 hr tablet 400 mg, 400 mg, Oral, QHS, Corky Sox, MD   sucralfate (CARAFATE) tablet 1 g, 1 g, Oral, TID Arlis Porta, MD, 1 g at 06/09/21 1553   thiamine 5233m in normal saline (556m IVPB, 500 mg, Intravenous, Q8H, Last Rate: 100 mL/hr at 06/09/21 1617, 500 mg at 06/09/21 1617 **FOLLOWED BY** [START ON 06/11/2021] thiamine (B-1) 250 mg in sodium chloride 0.9 % 50 mL IVPB, 250 mg, Intravenous, Daily **FOLLOWED BY** [START ON 06/17/2021] thiamine (B-1)  injection 100 mg, 100 mg, Intravenous, Daily, SoHolley BoucheMD  Pertinent Labs TSH .744    glucose 80    Na 137    K 3.5    UDS negative. ASA level normal. Ethanol level < 10. GHB screen pending. B12 600.   Imaging  06/09/21 MRI brain Normal MRI of the brain. No abnormality seen to explain seizure. No imaging finding of Wernicke encephalopathy.   Assessment: [age]o female with witnessed seizure x 1 minute yesterday with EEG negative and no need to start AEDs. Yesterday, she was terribly fidgety with pressured and disorganized speech and Wernicke's, possibly, Korsakoff's was highly suspected. She has improved with high dose Thiamine, but her MRI brain today showed no evidence of Wernicke's. Plus, she has been walking around unit all day today without ataxia, so this may very well not be Wernicke's or you can not really argue her progression with the high dose Thiamine. NP is more suspicious of mania now and a psychiatric consult is likely a good idea. Also, opioid and ETOH abuse long term affects are having some consequences.   Impression: -encephalopathy, now doubt Wernicke's/Korsakoff's.  -Hx bipolar disorder with manic activity and behavior.  -ETOH and opioid abuse.  -polypharmacy.   Recommendations/Plan:  -Seizure precautions.  -Continue Wernicke's dosing of Thiamine supplementation.  -recommend psychiatry consult.  -Ativan 33m66mV for seizure activity lasting over 5 mins and notify neurology.  -Hold AEDs for now.  -Work on cutting down her medications.  -NP doubts patient will ever be able to return to her own home. She will likely need LTC placement.    Pt seen by KarClance BollSN, APN-BC/Nurse Practitioner/Neuro  Findings and plan discussed with and approved by H. ColTheda SersD.  Pager: [phone]

## 2021-06-09 NOTE — ED Notes (Signed)
Patient eating pulse ox at this time. Redirected appropriately

## 2021-06-09 NOTE — ED Notes (Addendum)
Patient continuing to display tremors when arms extended, delusions, hallucinations, and disorientation. Patient states she is "being peed on", talking with/about individuals not in the room. This RN continues to reach out to providers regarding her concerns.

## 2021-06-09 NOTE — Progress Notes (Signed)
Patient unavailable for EEG.  She [place] in PeriOp.

## 2021-06-09 NOTE — Progress Notes (Signed)
Per RN, patient is unavailable for EEG.  Will re-attempt at 0930.

## 2021-06-09 NOTE — Transfer of Care (Signed)
Immediate Anesthesia Transfer of Care Note  Patient: Tricia Brown  Procedure(s) Performed: MRI WITH ANESTHESIA  Patient Location: PACU  Anesthesia Type:General  Level of Consciousness: Awake, Alert, Confused  Airway & Oxygen Therapy: Patient Spontanous Breathing  Post-op Assessment: Report given to RN and Post -op Vital signs reviewed and stable  Post vital signs: Reviewed and stable  Last Vitals:  Vitals Value Taken Time  BP 134/69 06/09/21 1152  Temp    Pulse 87 06/09/21 1154  Resp 17 06/09/21 1154  SpO2 97 % 06/09/21 1154  Vitals shown include unvalidated device data.  Last Pain:  Vitals:   06/09/21 1000  TempSrc:   PainSc: 0-No pain         Complications: No notable events documented.

## 2021-06-09 NOTE — Procedures (Signed)
Patient Name: Jeraline Marcinek  MRN: [id number]  Epilepsy Attending: Charlsie Quest  Referring Physician/Provider: Jimmye Norman, NP Date: 06/09/2021 Duration: 22.20 mins  Patient history: 45 yo female with witnessed seizure x 1 minute today described as GTC with post ictal period. Very confused with abnormal, uncontrollable body movements. EEG to evaluate for seizure.   Level of alertness: Awake  AEDs during EEG study: None  Technical aspects: This EEG study was done with scalp electrodes positioned according to the 10-20 International system of electrode placement. Electrical activity was acquired at a sampling rate of 500Hz  and reviewed with a high frequency filter of 70Hz  and a low frequency filter of 1Hz . EEG data were recorded continuously and digitally stored.   Description: The posterior dominant rhythm consists of 9-10 Hz activity of moderate voltage (25-35 uV) seen predominantly in posterior head regions, symmetric and reactive to eye opening and eye closing.  Hyperventilation and photic stimulation were not performed.     IMPRESSION: This study is within normal limits. No seizures or epileptiform discharges were seen throughout the recording.  Anddy Wingert 

## 2021-06-09 NOTE — ED Notes (Signed)
This RN discussed with provider about this RN's concerns

## 2021-06-09 NOTE — Anesthesia Preprocedure Evaluation (Addendum)
Anesthesia Evaluation  Patient identified by MRN, date of birth, ID band Patient confused    Reviewed: Allergy & Precautions, H&P , NPO status , Patient's Chart, lab work & pertinent test results, reviewed documented beta blocker date and time , Unable to perform ROS - Chart review only  Airway Mallampati: I  TM Distance: >3 FB Neck ROM: full    Dental no notable dental hx. (+) Edentulous Upper, Edentulous Lower   Pulmonary neg pulmonary ROS, Patient abstained from smoking., former smoker,    Pulmonary exam normal breath sounds clear to auscultation       Cardiovascular Exercise Tolerance: Good negative cardio ROS Normal cardiovascular exam Rhythm:regular Rate:Normal     Neuro/Psych  Headaches, Seizures -,  PSYCHIATRIC DISORDERS Depression    GI/Hepatic GERD  ,(+)     substance abuse  alcohol use,   Endo/Other  negative endocrine ROS  Renal/GU Renal disease  negative genitourinary   Musculoskeletal  (+) narcotic dependent  Abdominal   Peds  Hematology  (+) Blood dyscrasia, anemia ,   Anesthesia Other Findings   Reproductive/Obstetrics negative OB ROS                           Anesthesia Physical Anesthesia Plan  ASA: 3 and emergent  Anesthesia Plan: General   Post-op Pain Management:    Induction: Intravenous  PONV Risk Score and Plan: 3 and TIVA  Airway Management Planned: Oral ETT and LMA  Additional Equipment: None  Intra-op Plan:   Post-operative Plan:   Informed Consent: I have reviewed the patients History and Physical, chart, labs and discussed the procedure including the risks, benefits and alternatives for the proposed anesthesia with the patient or authorized representative who has indicated his/her understanding and acceptance.     Dental Advisory Given  Plan Discussed with: CRNA and Anesthesiologist  Anesthesia Plan Comments: Ronda Rajkumar is a 45 y.o.  female presenting with AMS. PMHx of ETOH and opiod abuse, multiple admissions for pancreatitis, pancreatic pseudocyst, necrotizing pancreatitis with recent 2 month stay at [facility], DVT on AC, depression, IBS, bipolar disorder per daughter, tobacco abuse, cystogastrostomy 2021, and chronic back pain.  Seizure Daughter found patient lying on sofa shaking and foaming at the mouth, no bowel or bladder incontinence or tongue biting suspected. Patient has history significant for drug abuse and polysubstance use  AKI Cr 1.12, baseline of 0.8. Likely elevated d/t urinary retention. BS showed 500 cc, patient urinated 425 c  Polysubstance abuse Patient has history of opioid and alcohol use, on Suboxone, with opoid overdose 1 year ago, per chart review. Patient also prescribed ativan and gabapentin.   Hx of severe pancreatitis Patient has multiple admissions for pancreatitis, pancreatic pseudocyst, and necrotizing pancreatitis with recent 2 month stay at [facility]. Patient taking Creon. Lipase and LFTs wnl.   Bipolar Depression Per daughter, patient has history of bipolar depression. Patient taking Abilify 10, Buspar 15, Cymbalta 60, mirtazapine 15, quetiapine 400 and quetiapine 25,  -Reconcile home meds  GERD Per chart review, patient has history of reflux  Tobacco use disorder -Nicotine patch 14 mg  Asthma: Patient taking Symbicort 80-4.5, and Proair HFA)       Anesthesia Quick Evaluation

## 2021-06-10 ENCOUNTER — Encounter (HOSPITAL_COMMUNITY): Payer: Self-pay | Admitting: Radiology

## 2021-06-10 DIAGNOSIS — F1020 Alcohol dependence, uncomplicated: Secondary | ICD-10-CM

## 2021-06-10 DIAGNOSIS — F329 Major depressive disorder, single episode, unspecified: Secondary | ICD-10-CM | POA: Diagnosis not present

## 2021-06-10 DIAGNOSIS — R4182 Altered mental status, unspecified: Secondary | ICD-10-CM | POA: Diagnosis not present

## 2021-06-10 DIAGNOSIS — K219 Gastro-esophageal reflux disease without esophagitis: Secondary | ICD-10-CM

## 2021-06-10 DIAGNOSIS — F1120 Opioid dependence, uncomplicated: Secondary | ICD-10-CM

## 2021-06-10 DIAGNOSIS — R1115 Cyclical vomiting syndrome unrelated to migraine: Secondary | ICD-10-CM

## 2021-06-10 LAB — URINALYSIS, MICROSCOPIC (REFLEX)

## 2021-06-10 LAB — URINE CULTURE: Culture: >=100000 — AB

## 2021-06-10 LAB — URINALYSIS, ROUTINE W REFLEX MICROSCOPIC
Bilirubin Urine: NEGATIVE
Glucose, UA: NEGATIVE mg/dL
Hgb urine dipstick: NEGATIVE
Ketones, ur: 15 mg/dL — AB
Nitrite: POSITIVE — AB
Protein, ur: NEGATIVE mg/dL
Specific Gravity, Urine: 1.02 (ref 1.005–1.030)
pH: 6.5 (ref 5.0–8.0)

## 2021-06-10 LAB — CBC
HCT: 34.6 % — ABNORMAL LOW (ref 36.0–46.0)
Hemoglobin: 11.4 g/dL — ABNORMAL LOW (ref 12.0–15.0)
MCH: 30.6 pg (ref 26.0–34.0)
MCHC: 32.9 g/dL (ref 30.0–36.0)
MCV: 93 fL (ref 80.0–100.0)
Platelets: 240 10*3/uL (ref 150–400)
RBC: 3.72 MIL/uL — ABNORMAL LOW (ref 3.87–5.11)
RDW: 14.7 % (ref 11.5–15.5)
WBC: 6.8 10*3/uL (ref 4.0–10.5)
nRBC: 0 % (ref 0.0–0.2)

## 2021-06-10 LAB — COMPREHENSIVE METABOLIC PANEL
ALT: 17 U/L (ref 0–44)
AST: 15 U/L (ref 15–41)
Albumin: 3.3 g/dL — ABNORMAL LOW (ref 3.5–5.0)
Alkaline Phosphatase: 115 U/L (ref 38–126)
Anion gap: 12 (ref 5–15)
BUN: 10 mg/dL (ref 6–20)
CO2: 22 mmol/L (ref 22–32)
Calcium: 8.8 mg/dL — ABNORMAL LOW (ref 8.9–10.3)
Chloride: 103 mmol/L (ref 98–111)
Creatinine, Ser: 0.93 mg/dL (ref 0.44–1.00)
GFR, Estimated: >60 mL/min (ref >60)
Glucose, Bld: 79 mg/dL (ref 70–99)
Potassium: 3.6 mmol/L (ref 3.5–5.1)
Sodium: 137 mmol/L (ref 135–145)
Total Bilirubin: 0.4 mg/dL (ref 0.3–1.2)
Total Protein: 5.6 g/dL — ABNORMAL LOW (ref 6.5–8.1)

## 2021-06-10 MED ORDER — BUPRENORPHINE HCL-NALOXONE HCL 8-2 MG SL SUBL
1.0000 | SUBLINGUAL_TABLET | Freq: Three times a day (TID) | SUBLINGUAL | Status: DC
Start: 2021-06-10 — End: 2021-06-12
  Administered 2021-06-10 – 2021-06-11 (×5): 1 via SUBLINGUAL
  Filled 2021-06-10 (×5): qty 1

## 2021-06-10 MED ORDER — DULOXETINE HCL 30 MG PO CPEP
30.0000 mg | ORAL_CAPSULE | Freq: Every day | ORAL | Status: DC
  Administered 2021-06-11: 30 mg via ORAL
  Filled 2021-06-10: qty 1

## 2021-06-10 MED ORDER — GABAPENTIN 300 MG PO CAPS
600.0000 mg | ORAL_CAPSULE | Freq: Three times a day (TID) | ORAL | Status: DC
  Administered 2021-06-10 – 2021-06-11 (×4): 600 mg via ORAL
  Filled 2021-06-10 (×4): qty 2

## 2021-06-10 MED ORDER — BUSPIRONE HCL 10 MG PO TABS
15.0000 mg | ORAL_TABLET | Freq: Two times a day (BID) | ORAL | Status: DC
  Administered 2021-06-10 – 2021-06-11 (×2): 15 mg via ORAL
  Filled 2021-06-10 (×2): qty 2

## 2021-06-10 MED ORDER — CEPHALEXIN 500 MG PO CAPS
500.0000 mg | ORAL_CAPSULE | Freq: Four times a day (QID) | ORAL | Status: DC
  Administered 2021-06-10 – 2021-06-11 (×4): 500 mg via ORAL
  Filled 2021-06-10 (×4): qty 1

## 2021-06-10 NOTE — Evaluation (Signed)
Physical Therapy Evaluation Patient Details Name: Tricia Brown MRN: [id number] DOB: 23-Oct-1975 Today's Date: 06/10/2021  History of Present Illness  Pt is 45 yo female presenting with AMS s/p tonic-clonic seizure on 06/08/21. Pt with hx of alcohol and opioid abuse, pancreatitis, necrotizing pancreatitis with recent 7-month stay at [facility], pancreatic pseudocyst s/p cystogastrostomy in 2021, DVT on AC, depression, IBS, bipolar disorder per daughter, tobacco abuse, and chronic back pain  Clinical Impression  Pt admitted with above diagnosis.  She feels that she has mostly returned to baseline.  Demonstrated WNL strength and coordination.  Ambulated and transferred independently without LOB and scored 24/24 on DGI indicating low fall risk.  Did not note major deficits in cognition - pt reports feeling a little scattered but demonstrated good safety awareness, orientation, and problem solving during therapy.  No further acute PT needs.         Recommendations for follow up therapy are one component of a multi-disciplinary discharge planning process, led by the attending physician.  Recommendations may be updated based on patient status, additional functional criteria and insurance authorization.  Follow Up Recommendations No PT follow up    Equipment Recommendations  None recommended by PT    Recommendations for Other Services       Precautions / Restrictions Precautions Precautions: None      Mobility  Bed Mobility Overal bed mobility: Independent                  Transfers Overall transfer level: Independent               General transfer comment: Demonstrated multiple sit to stands safely  Ambulation/Gait Ambulation/Gait assistance: Independent Gait Distance (Feet): 500 Feet Assistive device: None;IV Pole Gait Pattern/deviations: WFL(Within Functional Limits) Gait velocity: normal   General Gait Details: Pt has been mobilizing in room independnetly and managing IV  pole (unpulgging, pushing).  Ambulated in hall with supervision but at independent/safe level  Stairs Stairs: Yes Stairs assistance: Supervision Stair Management: No rails;Forwards;Alternating pattern Number of Stairs: 5 General stair comments: performed safely  Wheelchair Mobility    Modified Rankin (Stroke Patients Only)       Balance Overall balance assessment: Independent   Sitting balance-Leahy Scale: Normal       Standing balance-Leahy Scale: Normal                   Standardized Balance Assessment Standardized Balance Assessment : Dynamic Gait Index   Dynamic Gait Index Level Surface: Normal Change in Gait Speed: Normal Gait with Horizontal Head Turns: Normal Gait with Vertical Head Turns: Normal Gait and Pivot Turn: Normal Step Over Obstacle: Normal Step Around Obstacles: Normal Steps: Normal Total Score: 24       Pertinent Vitals/Pain Pain Assessment: 0-10 Pain Score: 7  Pain Location: left mid abdomen, around to her back (reports  chronic pancreatitis) Pain Descriptors / Indicators: Discomfort Pain Intervention(s): Limited activity within patient's tolerance;Monitored during session    Home Living Family/patient expects to be discharged to:: Private residence Living Arrangements: Alone Available Help at Discharge: Available PRN/intermittently;Neighbor Type of Home: House Home Access: Stairs to enter Entrance Stairs-Rails: Doctor, general practice of Steps: 4 Home Layout: One level Home Equipment: Grab bars - tub/shower;Shower seat;Walker - 2 wheels;Cane - single point;Crutches;Bedside commode Additional Comments: left over supplies from her mother    Prior Function Level of Independence: Independent               Hand Dominance  Extremity/Trunk Assessment   Upper Extremity Assessment Upper Extremity Assessment: Overall WFL for tasks assessed (ROM WFL; MMT 5/5; coordination WNL)    Lower Extremity  Assessment Lower Extremity Assessment: Overall WFL for tasks assessed (ROM WFL; MMT 5/5; coordination heel/shin WNL)    Cervical / Trunk Assessment Cervical / Trunk Assessment: Normal  Communication   Communication: No difficulties  Cognition Arousal/Alertness: Awake/alert Behavior During Therapy: WFL for tasks assessed/performed Overall Cognitive Status: Within Functional Limits for tasks assessed                                 General Comments: Pt following multistep commands, problem solving, multitasking - calling in lunch and asking therapist questions while waiting.  Did admit she still feels a little "scattered/forgetful" but otherwise back to normal      General Comments      Exercises     Assessment/Plan    PT Assessment Patent does not need any further PT services  PT Problem List         PT Treatment Interventions      PT Goals (Current goals can be found in the Care Plan section)  Acute Rehab PT Goals Patient Stated Goal: home PT Goal Formulation: All assessment and education complete, DC therapy    Frequency     Barriers to discharge        Co-evaluation               AM-PAC PT "6 Clicks" Mobility  Outcome Measure Help needed turning from your back to your side while in a flat bed without using bedrails?: None Help needed moving from lying on your back to sitting on the side of a flat bed without using bedrails?: None Help needed moving to and from a bed to a chair (including a wheelchair)?: None Help needed standing up from a chair using your arms (e.g., wheelchair or bedside chair)?: None Help needed to walk in hospital room?: None Help needed climbing 3-5 steps with a railing? : None 6 Click Score: 24    End of Session   Activity Tolerance: Patient tolerated treatment well Patient left: in bed;with call bell/phone within reach Nurse Communication: Mobility status      Time: 9983-3825 PT Time Calculation (min) (ACUTE  ONLY): 17 min   Charges:   PT Evaluation $PT Eval Low Complexity: 1 Low          Brya Simerly, PT Acute Rehab Services Pager [phone] [facility] [phone]   Rayetta Humphrey 06/10/2021, 2:21 PM

## 2021-06-10 NOTE — TOC CAGE-AID Note (Signed)
Transition of Care [facility]) - CAGE-AID Screening   Patient Details  Name: Tricia Brown MRN: [id number] Date of Birth: Dec 03, 1975  Transition of [facility]) CM/SW Contact:    Kermit Balo, RN Phone Number: 06/10/2021, 11:51 AM   Clinical Narrative: Patient denies any alcohol or drug abuse for 12 months. She uses suboxone.    CAGE-AID Screening: Substance Abuse Screening unable to be completed due to: : Patient Refused (Patient states she has been sober for 12 months. She currently is on suboxone)

## 2021-06-10 NOTE — Consult Note (Addendum)
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 COMPARISON:  03/26/2021 FINDINGS: Motion artifact on images decreases sensitivity, as the patient would not cooperate during the examination. Brain: No evidence of acute infarction, hemorrhage, hydrocephalus, extra-axial collection or mass lesion/mass effect. Vascular: No hyperdense vessel or unexpected calcification. Skull: Normal. Negative for fracture or focal lesion. Sinuses/Orbits: No acute finding. Other: None. IMPRESSION: No evidence of acute intracranial abnormality. Motion artifact decreases sensitivity, as the patient would not cooperate during the examination. Electronically Signed   By: Harmon Pier M.D.   On: 06/08/2021 14:26   MR BRAIN W WO  CONTRAST  Result Date: 06/09/2021 CLINICAL DATA:  Seizure, alcohol/drug related. Abnormal neurological exam. Wernicke's encephalopathy. EXAM: MRI HEAD WITHOUT AND WITH CONTRAST TECHNIQUE: Multiplanar, multiecho pulse sequences of the brain and surrounding structures were obtained without and with intravenous contrast. CONTRAST:  7mL GADAVIST GADOBUTROL 1 MMOL/ML IV SOLN COMPARISON:  Head CT yesterday.  MRI 05/30/2021. FINDINGS: Brain: The brain has a normal appearance without evidence of malformation, atrophy, old or acute small or large vessel infarction, mass lesion, hemorrhage, hydrocephalus or extra-axial collection. Mesial temporal lobes appear normal. After contrast administration, no abnormal enhancement occurs. Vascular: Major vessels at the base of the brain show flow. Venous sinuses appear patent. Skull and upper cervical spine: Normal. Sinuses/Orbits: Clear/normal. Other: None significant. IMPRESSION: Normal MRI of the brain. No abnormality seen to explain seizure. No imaging finding of Wernicke encephalopathy. Electronically Signed   By: Paulina Fusi M.D.   On: 06/09/2021 11:40   CT ABDOMEN PELVIS W CONTRAST  Result Date: 06/08/2021 CLINICAL DATA:  Left lower quadrant abdominal pain and left flank pain seizure-like activity. EXAM: CT ABDOMEN AND PELVIS WITH CONTRAST TECHNIQUE: Multidetector CT imaging of the abdomen and pelvis was performed using the standard protocol following bolus administration of intravenous contrast. Delayed images were not performed related to patient behavior. CONTRAST:  75mL OMNIPAQUE IOHEXOL 350 MG/ML SOLN COMPARISON:  Multiple exams, including 06/01/2021 FINDINGS: Lower chest: Linear bandlike atelectasis in the left lower lobe. Hepatobiliary: Cholecystectomy.  Otherwise unremarkable. Pancreas: Unremarkable Spleen: Unremarkable Adrenals/Urinary Tract: Unremarkable Stomach/Bowel: Air-fluid level in the rectum which can correlate with diarrheal process. Borderline prominent  caliber of the right colon but without wall thickening. No dilated small bowel identified. Normal appendix. Vascular/Lymphatic: Unremarkable Reproductive: Uterus absent.  Adnexa unremarkable. Other: No supplemental non-categorized findings. Musculoskeletal: Old healed bilateral lower rib fractures. IMPRESSION: 1. Air fluid level in the rectum indicating a diarrheal process. 2. Mild subsegmental atelectasis in the left lower lobe. Electronically Signed   By: Gaylyn Rong M.D.   On: 06/08/2021 17:33   DG Chest Port 1 View  Result Date: 06/08/2021 CLINICAL DATA:  Questionable sepsis. EXAM: PORTABLE CHEST 1 VIEW COMPARISON:  Chest x-ray 05/15/2014. FINDINGS: There is some strandy opacities in both lung bases. The costophrenic angles are clear. There is no pneumothorax. The cardiomediastinal silhouette appears within normal limits. The osseous structures are within normal limits. IMPRESSION: 1. Minimal bibasilar opacities favored as atelectasis. Electronically Signed   By: Darliss Cheney M.D.   On: 06/08/2021 15:07   EEG adult  Result Date: 06/09/2021 Charlsie Quest, MD     06/09/2021  4:09 PM Patient Name: Tricia Brown MRN: [id number] Epilepsy Attending: Charlsie Quest Referring Physician/Provider: Jimmye Norman, NP Date: 06/09/2021 Duration: 22.20 mins Patient history: 45 yo female with witnessed seizure x 1 minute today described as GTC with post ictal period. Very confused with abnormal, uncontrollable body movements. EEG to evaluate for seizure. Level of alertness: Awake AEDs during EEG study: None Technical aspects: This EEG study was done with scalp electrodes positioned according to the 10-20 International system of electrode placement. Electrical activity was acquired at a sampling rate of 500Hz  and reviewed with a high frequency filter of 70Hz  and a low frequency filter of 1Hz . EEG data were recorded continuously and digitally stored. Description: The posterior dominant rhythm consists of  9-10 Hz activity of moderate voltage (25-35 uV) seen predominantly in posterior head regions, symmetric and reactive to eye opening and eye closing.  Hyperventilation and photic stimulation were not performed.   IMPRESSION: This  study is within normal limits. No seizures or epileptiform discharges were seen throughout the recording. Priyanka Annabelle Harman    These ROS are for symptoms during the time frame one week prior to hospitalization.  Review of Systems  Constitutional:  Positive for fever (prior to admission).  Respiratory:  Positive for chest tightness. Negative for cough and shortness of breath.   Gastrointestinal:  Positive for abdominal pain (prior to admission), diarrhea (prior to admission) and nausea (mild). Negative for constipation and vomiting.  Neurological:  Negative for weakness and headaches.  Psychiatric/Behavioral:  Negative for agitation, behavioral problems, hallucinations, sleep disturbance and suicidal ideas. The patient is not hyperactive.   Blood pressure 113/62, pulse 91, temperature (!) 97.4 F (36.3 C), temperature source Axillary, resp. rate 20, height 5\' 7"  (1.702 m), weight 73.4 kg, SpO2 100 %. Physical Exam Vitals and nursing note reviewed.  Constitutional:      Appearance: Normal appearance. She is normal weight.  HENT:     Head: Normocephalic and atraumatic.  Pulmonary:     Effort: Pulmonary effort is normal.  Musculoskeletal:        General: Normal range of motion.  Neurological:     General: No focal deficit present.     Mental Status: She is alert.     Comments: On all four extremities had 1-2 beats of Clonus      06/10/21 1454  Presentation  General Appearance Appropriate for Environment;Casual;Fairly Groomed (appears older than stated age)  Eye Contact Good  Speech Clear and Coherent;Normal Rate  Speech Volume Normal  Mood and Affect  Mood Euthymic  Affect Congruent;Appropriate  Thought Processes  Thought Process Coherent  Descriptions of  Associations Intact  Orientation Partial (oriented to person, month, year, and situation, not to location)  Thought Content Logical;WDL  Hallucinations None  Ideas of Reference None  Suicidal Thoughts No  Homicidal Thoughts No  Sensorium  Memory Immediate Fair;Recent Fair  Judgment Fair  Insight Fair  Executive Functions  Concentration Good  Attention Span Good  Recall Good  Fund of Knowledge Good  Language Good  Psychomotor Activity  Psychomotor Activity Normal  Assets  Assets Desire for Improvement;Resilience;Social Support  Sleep  Sleep Fair     Assessment/Plan: Tricia Brown is a 45 y.o. female admitted medically for 06/08/2021 12:55 PM for AMS. She carries the psychiatric diagnoses of MDD and per daughter Bipolar Disorder and has a past medical history of  ETOH and opiod abuse, multiple admissions for pancreatitis, pancreatic pseudocyst, necrotizing pancreatitis with recent 2 month stay at [facility], DVT on [facility], depression, IBS, tobacco abuse, cystogastrostomy 2021, and chronic back pain.Marland KitchenPsychiatry was consulted for AMS.    Outpatient psychotropic medications include Abilify, Buspar, Cymbalta, Remeron, Seroquel, and Gabapentin and historically has had a good response to these medications. She was compliant with medications prior to admission as evidenced by her consistent filling of all her medications per dispense order. On initial examination, patient sitting up in bed but did have wincing throughout the interview due to pain.    Today patient is improved and alert and oriented.  Given her reports of fever, diarrhea, altered mental status, clonus and seizure prior to her hospitalization, she most likely had serotonin syndrome.  This is further supported by documentation of muscle twitching earlier in hospital stay that has now resolved.  The trigger for this appears to have been the over-the-counter DayQuil because she reports being stable on her medications for a few months and the  symptoms only started when she  had a head cold and began taking the DayQuil.  We suspect dextromethorphan most likely triggering agent in Dayquil. To prevent further episodes of this happening we will make the following recommendations-stop her Abilify and decrease the frequency of her BuSpar.  These recommendations were discussed with the primary team.   Recommendations: -Stop Abilify -Decrease frequency of BuSpar 15 mg to BID   Continue rest of care per Primary Team Psychiatry will continue to follow the patient   Lauro Franklin 06/10/2021, 8:44 AM

## 2021-06-10 NOTE — Progress Notes (Signed)
Family Medicine Teaching Service Daily Progress Note Intern Pager: [phone]  Patient name: Tricia Brown Medical record number: [id number] Date of birth: 02-10-1976 Age: 45 y.o. Gender: female  Primary Care Provider: [facility]: Neuro Code Status: FULL  Pt Overview and Major Events to Date:  9/18: admitted 9/19: normal MRI-brain  Assessment and Plan: The patient is a 45 year old female presenting with altered mental status s/p tonic-clonic seizure.  Past medical history of alcohol and opioid abuse, pancreatitis, necrotizing pancreatitis with recent 76-month stay at [facility], pancreatic pseudocyst s/p cystogastrostomy in 2021, DVT on AC, depression, IBS, bipolar disorder per daughter, tobacco abuse, and chronic back pain.  Altered mental status, 1 minute tonic-clonic seizure EEG returned within normal limits.  MRI brain also within normal limits.  Wernicke-Korsakoff still a potential diagnosis for altered mental status given lack of sensitivity of this test in the condition and patient presenting with classic classic triad that partially responded to thiamine.  As far as etiology of seizure, synthetic drug ingestion and alcohol withdrawal are possible etiologies.  Psych thinks this may be serotonin syndrome that has since resolved. Most recent CIWA and COWS of 1.  Patient reporting dysuria, UA with nitrites, urine culture growing 100,000 CFU of staph hemolyticus.   -Neuro consult -- Psych consult -Thiamine 500 mg q8hrs through 9/20, 250 mg daily (9/21-9/27), 100 mg (9/27-) - Keflex 500 mg q6hrs for 5 days - Multivitamin - Continue CIWA and COWS - Ativan 2 mg IV for seizure over 5 minutes - No AED yet per neuro recs - GHB screen pending -Blood cultures negative at 24 hours - D/C cardiac monitoring - Tylenol 650 mg every 6 hours as needed -- PT/OT today  Opioid use disorder - Continue home Subutex 8 mg, 3 times daily - Continue home Bentyl 20 mg, 3 times  daily  Mood disorder, unspecified Restarted home meds yesterday - Discontinue Abilify 10 mg, per psych recs - Decrease BuSpar 15 mg, to 2 times daily, per psych recs - Remeron 15 mg nightly - Decrease Cymbalta to 30 mg daily - Seroquel 400 mg nightly  Chronic pancreatitis - Creon 108,000 3 times daily before every meal - Sucralfate 1 g, 3 times daily before every meal  FEN/GI: regular diet PPx: Lovenox Dispo:TBD given AMS  Subjective:  Patient reports doing well this AM. Her mental status is significantly improved from yesterday. She is alert and oriented to events, and is able to converse without making bizarre statements. Her thought process is linear and logical at the time of my assessment. ROS negative for CP, SOB, N/V.   Objective: Temp:  [97.8 F (36.6 C)-98.3 F (36.8 C)] 97.9 F (36.6 C) (09/20 0500) Pulse Rate:  [81-94] 88 (09/20 0500) Resp:  [15-20] 18 (09/20 0500) BP: (107-137)/(67-106) 112/74 (09/20 0500) SpO2:  [97 %-100 %] 97 % (09/20 0500) Weight:  [161 lb 13.1 oz (73.4 kg)] 161 lb 13.1 oz (73.4 kg) (09/19 0950) Physical Exam Vitals reviewed.  Cardiovascular:     Rate and Rhythm: Normal rate and regular rhythm.     Heart sounds: No murmur heard. Pulmonary:     Effort: Pulmonary effort is normal.     Breath sounds: Normal breath sounds.  Abdominal:     General: Bowel sounds are normal. There is no distension.     Palpations: Abdomen is soft.     Tenderness: There is no abdominal tenderness.  Neurological:     Mental Status: She is alert and oriented to person, place, and time.  Laboratory: Recent Labs  Lab 06/08/21 1329 06/10/21 0305  WBC 10.6* 6.8  HGB 12.7 11.4*  HCT 39.2 34.6*  PLT 265 240   Recent Labs  Lab 06/08/21 1329 06/09/21 0400 06/10/21 0305  NA 140 137 137  K 3.8 3.5 3.6  CL 108 105 103  CO2 23 23 22   BUN <5* <5* 10  CREATININE 1.12* 1.02* 0.93  CALCIUM 9.0 8.9 8.8*  PROT 6.2* 5.9* 5.6*  BILITOT 0.1* 0.7 0.4   ALKPHOS 127* 126 115  ALT 20 21 17   AST 21 19 15   GLUCOSE 101* 80 79    Imaging/Diagnostic Tests: Negative EEG  PGY-1, Psychiatry FPTS Intern pager: [phone], text pages welcome
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
## 2021-06-10 NOTE — Progress Notes (Addendum)
Neurology Progress Note  S: Sitting up in bed no overnight events or new complaints    O: Current vital signs: BP 113/62 (BP Location: Right Arm)   Pulse 91   Temp (!) 97.4 F (36.3 C) (Axillary)   Resp 20   Ht 5\' 7"  (1.702 m)   Wt 73.4 kg   SpO2 100%   BMI 25.34 kg/m  Vital signs in last 24 hours: Temp:  [97.4 F (36.3 C)-98.3 F (36.8 C)] 97.4 F (36.3 C) (09/20 0731) Pulse Rate:  [81-94] 91 (09/20 0731) Resp:  [15-20] 20 (09/20 0731) BP: (107-137)/(62-106) 113/62 (09/20 0731) SpO2:  [97 %-100 %] 100 % (09/20 0731) Weight:  [73.4 kg] 73.4 kg (09/19 0950)  GENERAL: well appearing. Awake, alert in NAD. HEENT: Normocephalic and atraumatic. LUNGS: Normal respiratory effort.  CV: RRR on tele.  Ext: warm.  NEURO:  Mental Status: Alert. Oriented x3  Follows commands consistently. Speech/Language: fluent intact comprehension and repetition. Cognitively improved and coherent. Memory: short term and remote intact.  Cranial Nerves:  2-12 grossly intact. No tremors Good strength Ataxia improved.   Medications  Current Facility-Administered Medications:    0.9 %  sodium chloride infusion, , Intravenous, Continuous, Sowell, Brandon, MD, Last Rate: 100 mL/hr at 06/09/21 1614, New Bag at 06/09/21 1614   acetaminophen (TYLENOL) tablet 650 mg, 650 mg, Oral, Q6H PRN, 06/11/21, MD, 650 mg at 06/10/21 0636   ARIPiprazole (ABILIFY) tablet 10 mg, 10 mg, Oral, Daily, 06/12/21, MD, 10 mg at 06/09/21 1553   buprenorphine (SUBUTEX) sublingual tablet 8 mg, 8 mg, Sublingual, TID, 06/11/21, MD, 8 mg at 06/09/21 2339   busPIRone (BUSPAR) tablet 15 mg, 15 mg, Oral, TID, 2340, MD, 15 mg at 06/09/21 2339   dicyclomine (BENTYL) tablet 20 mg, 20 mg, Oral, TID PRN, 2340, MD   DULoxetine (CYMBALTA) DR capsule 60 mg, 60 mg, Oral, Daily, Carlyn Reichert, MD, 60 mg at 06/09/21 1553   enoxaparin (LOVENOX) injection 40 mg, 40 mg, Subcutaneous, Q24H, Sowell,  Brandon, MD, 40 mg at 06/09/21 2340   folic acid (FOLVITE) tablet 1 mg, 1 mg, Oral, Daily, 06/11/21, MD, 1 mg at 06/09/21 1553   gabapentin (NEURONTIN) capsule 300 mg, 300 mg, Oral, TID, 06/11/21, MD, 300 mg at 06/09/21 2339   influenza vac split quadrivalent PF (FLUARIX) injection 0.5 mL, 0.5 mL, Intramuscular, Tomorrow-1000, Hensel, 2340, MD   lipase/protease/amylase (CREON) capsule 108,000 Units, 108,000 Units, Oral, TID WC, Santiago Bumpers, MD, 108,000 Units at 06/09/21 1553   mirtazapine (REMERON) tablet 15 mg, 15 mg, Oral, QHS, 06/11/21, MD, 15 mg at 06/09/21 2339   multivitamin with minerals tablet 1 tablet, 1 tablet, Oral, Daily, 2340, MD, 1 tablet at 06/09/21 1554   nicotine (NICODERM CQ - dosed in mg/24 hours) patch 14 mg, 14 mg, Transdermal, Daily, Sowell, 06/11/21, MD, 14 mg at 06/08/21 2214   pantoprazole (PROTONIX) EC tablet 40 mg, 40 mg, Oral, Daily, 2215, MD, 40 mg at 06/09/21 1554   QUEtiapine (SEROQUEL XR) 24 hr tablet 400 mg, 400 mg, Oral, QHS, 06/11/21, MD, 400 mg at 06/10/21 0007   sucralfate (CARAFATE) tablet 1 g, 1 g, Oral, TID 06/12/21, MD, 1 g at 06/09/21 1553   thiamine 500mg  in normal saline (45ml) IVPB, 500 mg, Intravenous, Q8H, Last Rate: 100 mL/hr at 06/09/21 2349, 500 mg at 06/09/21 2349 **FOLLOWED BY** [START ON 06/11/2021] thiamine (B-1) 250 mg in sodium chloride 0.9 % 50 mL  IVPB, 250 mg, Intravenous, Daily **FOLLOWED BY** [START ON 06/17/2021] thiamine (B-1) injection 100 mg, 100 mg, Intravenous, Daily, Bess Kinds, MD  Pertinent Labs TSH .744    glucose 80    Na 137   K 3.5    UDS negative. ASA level normal. Ethanol level < 10. GHB screen pending. B12 600.   Imaging  06/09/21 MRI brain Normal MRI of the brain. No abnormality seen to explain seizure. No imaging finding of Wernicke encephalopathy.   Assessment: 45 yo female with 1 witnessed seizure lasting ~1 minute prior to presentation. Initial  exam had pressured and disorganized speech with ataxia. ED reported nystagmus and was given high dose thiamine for presumed Wernicke's. On evaluation in ED there was no nystagmus. EEG did not show epileptiform abnormalities.   MRI did not show evidence of heavy EtOH use or Wernicke's.   Impression/Plan:  She has good insight and states that she has maintained her sobriety for over 12 months. The patient has shown a stark improvement over initial presentation.   Acute encephalopathy, not clearly Wernicke's/Korsakoff however with her recent prolonged emesis, would continue thiamine, magnesium and multivitamin on discharge with PCP follow up. Hx bipolar disorder with manic activity and behavior.  Please call for questions.   Marisue Humble, MD Stroke Neurology Page: [phone]

## 2021-06-10 NOTE — Progress Notes (Signed)
FPTS Brief Progress Note  S: Sleeping in bed.    O: BP 107/88 (BP Location: Right Arm)   Pulse 85   Temp 97.8 F (36.6 C) (Oral)   Resp 18   Ht 5\' 7"  (1.702 m)   Wt 73.4 kg   SpO2 100%   BMI 25.34 kg/m   General: Sleeping soundly Respiratory: normal effort   A/P: 1. Encephalopathy acute  2. Lower abdominal pain  3. Major depressive disorder with single episode, remission status unspecified  4. Opioid use disorder, severe, dependence (HCC)  5. Acute on chronic pancreatitis (HCC)  6. Gastroesophageal reflux disease without esophagitis  7. Alcohol use disorder, severe, dependence (HCC)  - Orders reviewed. Labs for AM ordered, which was adjusted as needed.   , DO 06/10/2021, 12:58 AM PGY-3, [facility] Night Resident  Please page [phone] with questions.

## 2021-06-10 NOTE — Progress Notes (Signed)
FPTS Brief Progress Note  S: Lying in bed sleeping.    O: BP 113/60 (BP Location: Left Arm)   Pulse (!) 102   Temp 98.8 F (37.1 C)   Resp 18   Ht 5\' 7"  (1.702 m)   Wt 73.4 kg   SpO2 100%   BMI 25.34 kg/m   General: Sleeping soundly, no acute distress. Age appropriate. Respiratory: normal effort  A/P: 1. Encephalopathy acute  2. Lower abdominal pain - acetaminophen (TYLENOL) tablet 650 mg  3. Major depressive disorder with single episode, remission status unspecified - mirtazapine (REMERON) tablet 15 mg - QUEtiapine (SEROQUEL XR) 24 hr tablet 400 mg - busPIRone (BUSPAR) tablet 15 mg - DULoxetine (CYMBALTA) DR capsule 30 mg  4. Opioid use disorder, severe, dependence (HCC) - dicyclomine (BENTYL) tablet 20 mg  5. Acute on chronic pancreatitis (HCC) - lipase/protease/amylase (CREON) capsule 108,000 Units - sucralfate (CARAFATE) tablet 1 g  6. Gastroesophageal reflux disease without esophagitis - pantoprazole (PROTONIX) EC tablet 40 mg  7. Alcohol use disorder, severe, dependence (HCC) - gabapentin (NEURONTIN) capsule 600 mg  8. Acute cystitis without hematuria - cephALEXin (KEFLEX) capsule 500 mg  - Orders reviewed. Labs for AM ordered, which was adjusted as needed.   , DO 06/10/2021, 11:42 PM PGY-3, [facility] Night Resident  Please page [phone] with questions.

## 2021-06-10 NOTE — Hospital Course (Addendum)
Altered mental status, 1 minute tonic-clonic seizure Patient experienced a 1 minute tonic-clonic seizure on the day of admission.  This was in the context of a month of reported waxing and waning mentation (reported by the patient's daughter).  She was also noted to have nystagmus and ataxia.  Given her history of alcohol use disorder, Wernicke's encephalopathy was suspected.  She was started on high-dose IV thiamine which appeared to improve her nystagmus but did not change her mental status.  MRI brain was unremarkable and did not show any evidence of Wernicke's encephalopathy.  However, in the literature of this test is noted to have a low sensitivity.  Psychiatry was consulted, they feel strongly that the patient experienced serotonin syndrome.  This is based on a reported prehospital fever and their observation of clonus in all 4 extremities on exam.  They note that the patient has been taking DayQuil frequently which, given the dextromethorphan, can increase serotonin levels, especially given this patient's psychiatric medications (Cymbalta, BuSpar, Abilify).  The Abilify was discontinued, BuSpar was decreased to twice daily dosing (and D/C after 1 week), Cymbalta was decreased from 60 mg to 30 mg. Of note, the patient underwent an EEG that was negative.  Neurology did not recommend an antiepileptic medication to be continued outside the hospital.  She did not receive any antiepileptics while in the hospital.  The patient was discharged on oral thiamine and multivitamin, for follow-up with PCP.  UTI UA with nitrites, urine culture growing 100,000 CFU of staph hemolyticus.  The patient reported significant dysuria.  It was felt that this was unlikely to be contributing to the patient's altered mental status, given lack of pyelonephritis or systemic symptoms.  The patient was initially started on Keflex 500 mg every 6 hours for a 5-day course (9/22 9/24).  On discharge this was changed to 500 mg twice daily  for ease of dosing.  Chronic pancreatitis The patient has a history of chronic pancreatitis, necrotizing pancreatitis requiring a 5-month stay at [facility], pancreatic pseudocyst s/p cystogastrostomy.  While in the hospital she consistently complained of left upper quadrant pain.  This was felt to be due to her longstanding pancreatitis.  Her home Creon and sucralfate were continued for this admission.  Opioid use disorder Patient with longstanding history of opioid use disorder chronically on Suboxone.  Her home Suboxone was continued at 8-2 mg, 3 times daily.  She also reportedly had home Bentyl 20 mg, 3 times daily as needed, which was continued.  Mood disorder, unspecified The patient's home Abilify 10 mg was discontinued.  The patient's BuSpar was decreased from 15 mg 3 times daily to 2 times daily. BuSpar is then scheduled to be discontinued after one week (9/28) to reduce serotonergic burden. The patient's Cymbalta was decreased from 60 mg daily to 30 mg daily.  The patient was continued on her Remeron 15 mg nightly and Seroquel 400 mg nightly.  Recommend strongly that the patient avoid DayQuil in the future. Continue thiamine until PCP follow up. Then consider continuing if there are persistent concerns regarding patient alcohol use and nutritional status. Continue oral ABX for UTI until 9/24. Continue to monitor for dysuria and signs of pyelonephritis.  Consider keeping patient's gabapentin at the reduced dose given concern that this medicine, in combination with her many other psychiatric medications, could precipitate delirium.

## 2021-06-10 NOTE — TOC Initial Note (Signed)
Transition of Care [facility]) - Initial/Assessment Note    Patient Details  Name: Tricia Brown MRN: [id number] Date of Birth: Nov 17, 1975  Transition of Care [facility]) CM/SW Contact:    Kermit Balo, RN Phone Number: 06/10/2021, 11:53 AM  Clinical Narrative:         PcP: [facility]         Patient lives alone but states her uncle and cousin check on her regularly. She has DME at home from her mother but doesn't currently use any assistive devices.  Pt drives self to appointments.  She denies issues with home medications.  Awaiting PT/OT evals.  CM following.  Expected Discharge Plan: Home/Self Care Barriers to Discharge: Continued Medical Work up   Patient Goals and CMS Choice        Expected Discharge Plan and Services Expected Discharge Plan: Home/Self Care   Discharge Planning Services: CM Consult   Living arrangements for the past 2 months: Single Family Home                                      Prior Living Arrangements/Services Living arrangements for the past 2 months: Single Family Home Lives with:: Self Patient language and need for interpreter reviewed:: Yes Do you feel safe going back to the place where you live?: Yes        Care giver support system in place?: No (comment) Current home services: DME (has: walker/ shower seat/ cane/ 3 in 1--doesnt currently use any of them) Criminal Activity/Legal Involvement Pertinent to Current Situation/Hospitalization: No - Comment as needed  Activities of Daily Living Home Assistive Devices/Equipment: None ADL Screening (condition at time of admission) Patient's cognitive ability adequate to safely complete daily activities?: Yes Is the patient deaf or have difficulty hearing?: No Does the patient have difficulty seeing, even when wearing glasses/contacts?: No Does the patient have difficulty concentrating, remembering, or making decisions?: No Patient able to express need for assistance with ADLs?:  Yes Does the patient have difficulty dressing or bathing?: No Independently performs ADLs?: Yes (appropriate for developmental age) Does the patient have difficulty walking or climbing stairs?: No Weakness of Legs: None Weakness of Arms/Hands: None  Permission Sought/Granted                  Emotional Assessment Appearance:: Appears stated age Attitude/Demeanor/Rapport: Engaged Affect (typically observed): Accepting Orientation: : Oriented to Self, Oriented to Place, Oriented to  Time, Oriented to Situation   Psych Involvement: No (comment)  Admission diagnosis:  Encephalopathy acute [G93.40] AMS (altered mental status) [R41.82] Patient Active Problem List   Diagnosis Date Noted   AMS (altered mental status) 06/08/2021   Encephalopathy acute    Pyelonephritis 03/24/2021   Acute on chronic pancreatitis (HCC) 03/09/2021   Alcohol use disorder, severe, dependence (HCC) 11/29/2020   Gastroesophageal reflux disease without esophagitis 11/29/2020   Severe tobacco use disorder 11/04/2020   Acute pancreatitis 09/26/2020   AKI (acute kidney injury) (HCC) 08/15/2020   Transaminitis 08/15/2020   Anemia 08/15/2020   Abdominal pain 08/15/2020   Pseudocyst of pancreas    Gastritis and gastroduodenitis    Pancreatitis 07/10/2020   Hypokalemia 07/10/2020   Opioid use disorder, severe, dependence (HCC) 02/08/2019   MDD (major depressive disorder), recurrent episode, severe (HCC) 02/02/2019   Major depressive disorder, single episode 11/27/2016   Pelvic pain in female 09/08/2011   Endometriosis    Dysplasia  Depression    IBS (irritable bowel syndrome)    PCP:  [facility] Pharmacy:   [facility] [address], [place] - [address] [place] [address] Phone: [phone] Fax: [phone]     Social Determinants of Health (SDOH) Interventions    Readmission Risk Interventions No flowsheet data  found.

## 2021-06-11 DIAGNOSIS — F1120 Opioid dependence, uncomplicated: Secondary | ICD-10-CM | POA: Diagnosis not present

## 2021-06-11 DIAGNOSIS — F1020 Alcohol dependence, uncomplicated: Secondary | ICD-10-CM | POA: Diagnosis not present

## 2021-06-11 DIAGNOSIS — G9081 Serotonin syndrome: Secondary | ICD-10-CM

## 2021-06-11 DIAGNOSIS — T50991A Poisoning by other drugs, medicaments and biological substances, accidental (unintentional), initial encounter: Secondary | ICD-10-CM

## 2021-06-11 DIAGNOSIS — N3000 Acute cystitis without hematuria: Secondary | ICD-10-CM

## 2021-06-11 DIAGNOSIS — K219 Gastro-esophageal reflux disease without esophagitis: Secondary | ICD-10-CM | POA: Diagnosis not present

## 2021-06-11 DIAGNOSIS — G9340 Encephalopathy, unspecified: Secondary | ICD-10-CM | POA: Diagnosis not present

## 2021-06-11 LAB — COMPREHENSIVE METABOLIC PANEL
ALT: 17 U/L (ref 0–44)
AST: 13 U/L — ABNORMAL LOW (ref 15–41)
Albumin: 3 g/dL — ABNORMAL LOW (ref 3.5–5.0)
Alkaline Phosphatase: 105 U/L (ref 38–126)
Anion gap: 8 (ref 5–15)
BUN: 9 mg/dL (ref 6–20)
CO2: 26 mmol/L (ref 22–32)
Calcium: 8.5 mg/dL — ABNORMAL LOW (ref 8.9–10.3)
Chloride: 105 mmol/L (ref 98–111)
Creatinine, Ser: 0.83 mg/dL (ref 0.44–1.00)
GFR, Estimated: >60 mL/min (ref >60)
Glucose, Bld: 99 mg/dL (ref 70–99)
Potassium: 3.5 mmol/L (ref 3.5–5.1)
Sodium: 139 mmol/L (ref 135–145)
Total Bilirubin: <0.1 mg/dL — ABNORMAL LOW (ref 0.3–1.2)
Total Protein: 5.1 g/dL — ABNORMAL LOW (ref 6.5–8.1)

## 2021-06-11 LAB — CBC
HCT: 35 % — ABNORMAL LOW (ref 36.0–46.0)
Hemoglobin: 11.3 g/dL — ABNORMAL LOW (ref 12.0–15.0)
MCH: 30.5 pg (ref 26.0–34.0)
MCHC: 32.3 g/dL (ref 30.0–36.0)
MCV: 94.3 fL (ref 80.0–100.0)
Platelets: 217 10*3/uL (ref 150–400)
RBC: 3.71 MIL/uL — ABNORMAL LOW (ref 3.87–5.11)
RDW: 14.7 % (ref 11.5–15.5)
WBC: 5.7 10*3/uL (ref 4.0–10.5)
nRBC: 0 % (ref 0.0–0.2)

## 2021-06-11 MED ORDER — CEPHALEXIN 500 MG PO CAPS: 500.0000 mg | ORAL_CAPSULE | Freq: Two times a day (BID) | ORAL | Status: DC

## 2021-06-11 MED ORDER — CEPHALEXIN 500 MG PO CAPS: 500.0000 mg | ORAL_CAPSULE | Freq: Two times a day (BID) | ORAL | 0 refills | Status: AC

## 2021-06-11 MED ORDER — THIAMINE HCL 100 MG PO TABS
100.0000 mg | ORAL_TABLET | Freq: Every day | ORAL | Status: DC
  Administered 2021-06-11: 100 mg via ORAL
  Filled 2021-06-11: qty 1

## 2021-06-11 MED ORDER — BUSPIRONE HCL 15 MG PO TABS: 15.0000 mg | ORAL_TABLET | Freq: Two times a day (BID) | ORAL | 0 refills | Status: AC

## 2021-06-11 MED ORDER — DULOXETINE HCL 30 MG PO CPEP: 30.0000 mg | ORAL_CAPSULE | Freq: Every day | ORAL | 0 refills | Status: DC

## 2021-06-11 MED ORDER — GABAPENTIN 300 MG PO CAPS
600.0000 mg | ORAL_CAPSULE | Freq: Three times a day (TID) | ORAL | 1 refills | Status: DC
Start: 2021-06-11 — End: 2022-09-16

## 2021-06-11 MED ORDER — PANCRELIPASE (LIP-PROT-AMYL) 36000-114000 UNITS PO CPEP: 108000.0000 [IU] | ORAL_CAPSULE | Freq: Three times a day (TID) | ORAL | 0 refills | Status: AC

## 2021-06-11 NOTE — Evaluation (Signed)
Occupational Therapy Evaluation Patient Details Name: Tricia Brown MRN: [id number] DOB: 1976-05-21 Today's Date: 06/11/2021   History of Present Illness Pt is 45 yo female presenting with AMS s/p tonic-clonic seizure on 06/08/21. Pt with hx of alcohol and opioid abuse, pancreatitis, necrotizing pancreatitis with recent 47-month stay at [facility], pancreatic pseudocyst s/p cystogastrostomy in 2021, DVT on AC, depression, IBS, bipolar disorder per daughter, tobacco abuse, and chronic back pain   Clinical Impression   Pt admitted for concerns listed above. PTA pt reported that she was independent with all ADL's and IADL's, including driving, medication management, and financial management. At this time, pt demonstrated continued independence with all ADL's, requiring no assist. Additionally, pt completed the Short Blessed Test during a pathfinding activity with no errors. Overall, pt is most likely at or near her baseline, physically and cognitively. She has no further OT needs and acute OT will sign off.      Recommendations for follow up therapy are one component of a multi-disciplinary discharge planning process, led by the attending physician.  Recommendations may be updated based on patient status, additional functional criteria and insurance authorization.   Follow Up Recommendations  No OT follow up;Supervision - Intermittent;Other (comment) (Have daughter supervise medication management)    Equipment Recommendations  None recommended by OT    Recommendations for Other Services       Precautions / Restrictions Precautions Precautions: None Restrictions Weight Bearing Restrictions: No      Mobility Bed Mobility Overal bed mobility: Independent             General bed mobility comments: No concerns getting into or out of bed.    Transfers Overall transfer level: Independent               General transfer comment: Demonstrated multiple sit to stands safely    Balance  Overall balance assessment: Independent   Sitting balance-Leahy Scale: Normal       Standing balance-Leahy Scale: Normal                             ADL either performed or assessed with clinical judgement   ADL Overall ADL's : Modified independent;At baseline                                       General ADL Comments: Pt able to complete all ADL's with no difficulties. Moves slower than normal per her report, however she continues to be safe with all mobility     Vision Baseline Vision/History: 0 No visual deficits Ability to See in Adequate Light: 1 Impaired Patient Visual Report: No change from baseline Vision Assessment?: No apparent visual deficits Additional Comments: Pt reports that her vision is bad at baseline, she just went to the eye doctor and has glasses on order.     Perception Perception Perception Tested?: No   Praxis Praxis Praxis tested?: Within functional limits    Pertinent Vitals/Pain Pain Assessment: 0-10 Pain Score: 8  Pain Location: left mid abdomen, around to her back (reports  chronic pancreatitis) Pain Descriptors / Indicators: Discomfort Pain Intervention(s): Monitored during session;Repositioned     Hand Dominance Right   Extremity/Trunk Assessment Upper Extremity Assessment Upper Extremity Assessment: Overall WFL for tasks assessed   Lower Extremity Assessment Lower Extremity Assessment: Defer to PT evaluation   Cervical / Trunk Assessment Cervical / Trunk  Assessment: Normal   Communication Communication Communication: No difficulties   Cognition Arousal/Alertness: Awake/alert Behavior During Therapy: WFL for tasks assessed/performed Overall Cognitive Status: Within Functional Limits for tasks assessed                                 General Comments: Pt following multistep commands, problem solving, multitasking. She completed the short blessed test with no errors.   General Comments   VSS on RA, pt has multiple bruises on her arms, reports they are from the falls she has had when she blacks out.    Exercises     Shoulder Instructions      Home Living Family/patient expects to be discharged to:: Private residence Living Arrangements: Children Available Help at Discharge: Family;Available PRN/intermittently Type of Home: House Home Access: Stairs to enter [employer] of Steps: 4 Entrance Stairs-Rails: Right;Left Home Layout: One level     Bathroom Shower/Tub: Producer, television/film/video: Standard Bathroom Accessibility: Yes   Home Equipment: Grab bars - tub/shower;Shower seat;Walker - 2 wheels;Cane - single point;Crutches;Bedside commode          Prior Functioning/Environment Level of Independence: Independent                 OT Problem List: Decreased activity tolerance;Impaired balance (sitting and/or standing);Decreased safety awareness;Pain      OT Treatment/Interventions:      OT Goals(Current goals can be found in the care plan section) Acute Rehab OT Goals Patient Stated Goal: to go home OT Goal Formulation: All assessment and education complete, DC therapy Time For Goal Achievement: 06/11/21 Potential to Achieve Goals: Good  OT Frequency:     Barriers to D/C:            Co-evaluation              AM-PAC OT "6 Clicks" Daily Activity     Outcome Measure Help from another person eating meals?: None Help from another person taking care of personal grooming?: None Help from another person toileting, which includes using toliet, bedpan, or urinal?: None Help from another person bathing (including washing, rinsing, drying)?: None Help from another person to put on and taking off regular upper body clothing?: None Help from another person to put on and taking off regular lower body clothing?: None 6 Click Score: 24   End of Session Equipment Utilized During Treatment: Gait belt Nurse Communication: Mobility  status  Activity Tolerance: Patient tolerated treatment well Patient left: in bed;with call bell/phone within reach;with bed alarm set  OT Visit Diagnosis: Muscle weakness (generalized) (M62.81);Repeated falls (R29.6)                Time: 7425-9563 OT Time Calculation (min): 26 min Charges:  OT General Charges $OT Visit: 1 Visit OT Evaluation $OT Eval Low Complexity: 1 Low OT Treatments $Therapeutic Activity: 8-22 mins  Leovanni Bjorkman H., OTR/L Acute Rehabilitation  Yasha Tibbett Elane Madex Seals 06/11/2021, 1:02 PM

## 2021-06-11 NOTE — Plan of Care (Signed)

## 2021-06-11 NOTE — Discharge Summary (Signed)
Family Medicine [facility] Discharge Summary  Patient name: Tricia Brown Medical record number: [id number] Date of birth: [birth year] Age: 45 y.o. Gender: female Date of Admission: 06/08/2021  Date of Discharge: 06/11/2021 Admitting Physician: Shirlean Mylar, MD  Primary Care Provider: Center, [facility] Medical Consultants: Neurology, Psychiatry  Indication for Hospitalization: Seizure, Altered Mental Status  Discharge Diagnoses/Problem List:  Altered mental Status, Seizure UTI Chronic Pancreatitis Opioid Use Disorder Mood disorder, unspecified  Disposition: Home  Discharge Condition: improved, stable  Discharge Exam:  Physical Exam Vitals reviewed.  Cardiovascular:     Rate and Rhythm: Normal rate and regular rhythm.     Pulses: Normal pulses.     Heart sounds: No murmur heard. Pulmonary:     Effort: Pulmonary effort is normal.     Breath sounds: Normal breath sounds.  Abdominal:     General: Bowel sounds are normal.     Palpations: Abdomen is soft.     Comments: TTP in the LUQ  Neurological:     Mental Status: She is alert.     Brief Hospital Course:  Tricia Brown is a 45 y.o. female presenting with AMS. PMHx of ETOH and opiod abuse, multiple admissions for pancreatitis, pancreatic pseudocyst, necrotizing pancreatitis with recent 2 month stay at [facility], DVT on AC, depression, IBS, bipolar disorder per daughter, tobacco abuse, cystogastrostomy 2021, and chronic back pain.  Altered mental status, 1 minute tonic-clonic seizure Patient experienced a 1 minute tonic-clonic seizure on the day of admission.  This was in the context of a month of reported waxing and waning mentation (reported by the patient's daughter).  She was also noted to have nystagmus and ataxia.  Given her history of alcohol use disorder, Wernicke's encephalopathy was suspected.  She was started on high-dose IV thiamine which appeared to improve her nystagmus but did not change her mental  status.  MRI brain was unremarkable and did not show any evidence of Wernicke's encephalopathy.  However, in the literature of this test is noted to have a low sensitivity.  Psychiatry was consulted, they feel strongly that the patient experienced serotonin syndrome.  This is based on a reported prehospital fever and their observation of clonus in all 4 extremities on exam.  They note that the patient has been taking DayQuil frequently which, given the dextromethorphan, can increase serotonin levels, especially given this patient's psychiatric medications (Cymbalta, BuSpar, Abilify).  The Abilify was discontinued, BuSpar was decreased to twice daily dosing (and D/C after 1 week), Cymbalta was decreased from 60 mg to 30 mg. Of note, the patient underwent an EEG that was negative.  Neurology did not recommend an antiepileptic medication to be continued outside the hospital.  She did not receive any antiepileptics while in the hospital.  The patient was discharged on oral thiamine and multivitamin, she was instructed to follow up with her PCP at her earliest convenience for a hospital follow up.   UTI UA with nitrites, urine culture growing 100,000 CFU of staph hemolyticus.  The patient reported significant dysuria.  It was felt that this was unlikely to be contributing to the patient's altered mental status, given lack of pyelonephritis or systemic symptoms.  The patient was initially started on Keflex 500 mg every 6 hours for a 5-day course (9/22 9/24).  On discharge this was changed to 500 mg twice daily for ease of dosing.  Chronic pancreatitis The patient has a history of chronic pancreatitis, necrotizing pancreatitis requiring a 66-month stay at [facility], pancreatic pseudocyst s/p cystogastrostomy.  While in the hospital she consistently complained of left upper quadrant pain.  This was felt to be due to her longstanding pancreatitis.  Her home Creon and sucralfate were continued for this admission.  Opioid  use disorder Patient with longstanding history of opioid use disorder chronically on Suboxone.  Her home Suboxone was continued at 8-2 mg, 3 times daily.  She also reportedly had home Bentyl 20 mg, 3 times daily as needed, which was continued.  Mood disorder, unspecified The patient's home Abilify 10 mg was discontinued.  The patient's BuSpar was decreased from 15 mg 3 times daily to 2 times daily. BuSpar is then scheduled to be discontinued after one week (9/28) to reduce serotonergic burden. The patient's Cymbalta was decreased from 60 mg daily to 30 mg daily.  The patient was continued on her Remeron 15 mg nightly and Seroquel 400 mg nightly.  All other issues chronic and stable.    Issues for follow up: Recommend strongly that the patient avoid DayQuil in the future. Continue thiamine until PCP follow up. Then consider continuing if there are persistent concerns regarding patient alcohol use and nutritional status. Continue oral ABX for UTI until 9/24. Continue to monitor for dysuria and signs of pyelonephritis.  Consider keeping patient's gabapentin at the reduced dose given concern that this medicine, in combination with her many other psychiatric medications, could precipitate delirium.   Significant Procedures: EEG, MRI, both unremarkable  Significant Labs and Imaging:  Recent Labs  Lab 06/08/21 1329 06/10/21 0305 06/11/21 0359  WBC 10.6* 6.8 5.7  HGB 12.7 11.4* 11.3*  HCT 39.2 34.6* 35.0*  PLT 265 240 217   Recent Labs  Lab 06/08/21 1329 06/08/21 1439 06/09/21 0400 06/10/21 0305 06/11/21 0359  NA 140  --  137 137 139  K 3.8  --  3.5 3.6 3.5  CL 108  --  105 103 105  CO2 23  --  23 22 26   GLUCOSE 101*  --  80 79 99  BUN <5*  --  <5* 10 9  CREATININE 1.12*  --  1.02* 0.93 0.83  CALCIUM 9.0  --  8.9 8.8* 8.5*  MG  --  2.0  --   --   --   ALKPHOS 127*  --  126 115 105  AST 21  --  19 15 13*  ALT 20  --  21 17 17   ALBUMIN 3.6  --  3.4* 3.3* 3.0*    Results/Tests  Pending at Time of Discharge:  none  Discharge Medications:  Allergies as of 06/11/2021   No Known Allergies      Medication List     STOP taking these medications    ARIPiprazole 10 MG tablet Commonly known as: ABILIFY   aspirin-acetaminophen-caffeine 250-250-65 MG tablet Commonly known as: EXCEDRIN MIGRAINE   Emgality 120 MG/ML Soaj Generic drug: Galcanezumab-gnlm   MULTIVITAMIN ADULT PO       TAKE these medications    budesonide-formoterol 80-4.5 MCG/ACT inhaler Commonly known as: SYMBICORT Inhale 2 puffs into the lungs in the morning and at bedtime.   busPIRone 15 MG tablet Commonly known as: BUSPAR Take 1 tablet (15 mg total) by mouth 2 (two) times daily for 7 days. What changed: when to take this   cephALEXin 500 MG capsule Commonly known as: KEFLEX Take 1 capsule (500 mg total) by mouth 2 (two) times daily for 3 days.   colestipol 1 g tablet Commonly known as: COLESTID Take 1 tablet (1 g total)  by mouth 2 (two) times daily. Hold if constipation What changed:  when to take this reasons to take this additional instructions   DAYQUIL PO Take by mouth.   dicyclomine 20 MG tablet Commonly known as: BENTYL Take 1 tablet (20 mg total) by mouth 3 (three) times daily as needed for spasms (abdominal pain).   DULoxetine 30 MG capsule Commonly known as: CYMBALTA Take 1 capsule (30 mg total) by mouth daily. Start taking on: June 12, 2021 What changed:  medication strength how much to take   ferrous sulfate 324 MG Tbec Take 324 mg by mouth daily with breakfast.   fluorometholone 0.1 % ophthalmic suspension Commonly known as: FML Place 1 drop into both eyes 2 (two) times daily.   gabapentin 300 MG capsule Commonly known as: NEURONTIN Take 2 capsules (600 mg total) by mouth 3 (three) times daily. What changed:  how much to take when to take this   lipase/protease/amylase 57322 UNITS Cpep capsule Commonly known as: CREON Take 3 capsules  (108,000 Units total) by mouth 3 (three) times daily with meals.   mirtazapine 15 MG tablet Commonly known as: REMERON Take 15 mg by mouth at bedtime.   ondansetron 4 MG tablet Commonly known as: ZOFRAN Take 1 tablet (4 mg total) by mouth every 8 (eight) hours as needed for nausea or vomiting.   pantoprazole 40 MG tablet Commonly known as: Protonix Take 1 tablet (40 mg total) by mouth daily.   ProAir HFA 108 (90 Base) MCG/ACT inhaler Generic drug: albuterol Inhale 2 puffs into the lungs every 6 (six) hours as needed for wheezing or shortness of breath.   promethazine 12.5 MG tablet Commonly known as: PHENERGAN Take 1 tablet (12.5 mg total) by mouth every 6 (six) hours as needed for nausea or vomiting.   QUEtiapine 400 MG 24 hr tablet Commonly known as: SEROQUEL XR Take 400 mg by mouth at bedtime. What changed: Another medication with the same name was removed. Continue taking this medication, and follow the directions you see here.   Restasis 0.05 % ophthalmic emulsion Generic drug: cycloSPORINE Place 1 drop into both eyes 2 (two) times daily.   rizatriptan 10 MG disintegrating tablet Commonly known as: MAXALT-MLT Take 1 tablet (10 mg total) by mouth as needed for migraine. May repeat in 2 hours if needed What changed:  reasons to take this additional instructions   Suboxone 8-2 MG Film Generic drug: Buprenorphine HCl-Naloxone HCl Place 1 Film under the tongue 3 (three) times daily.   sucralfate 1 g tablet Commonly known as: CARAFATE Take 1 tablet (1 g total) by mouth 2 (two) times daily. What changed: when to take this   valACYclovir 500 MG tablet Commonly known as: VALTREX Take 500 mg by mouth daily as needed (as directed, for outbreaks).        Discharge Instructions: Please refer to Patient Instructions section of EMR for full details.  Patient was counseled important signs and symptoms that should prompt return to medical care, changes in medications,  dietary instructions, activity restrictions, and follow up appointments.   Follow-Up Appointments:  Follow-up Information     Center, [facility]. Schedule an appointment as soon as possible for a visit today.   Why: Please make an appointment at your earliest convenience for a hospital follow up. Contact information: [address] [place] [address] [phone]                 Carlyn Reichert PGY-1 [facility]  I was personally present and performed or re-performed the history, physical exam and medical decision making activities of this service and have verified that the service and findings are accurately documented in the resident's note. My edits are noted within the note. Please also see the attending's attestation and notes.   Reece Leader, DO                  06/11/2021, 5:53 PM  PGY-2 [facility]

## 2021-06-11 NOTE — Plan of Care (Signed)
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
## 2021-06-11 NOTE — Discharge Instructions (Addendum)
It was a pleasure to take care of you in the hospital! While you were here you were treated for a complication of your medications that caused severe confusion.  Stop taking dayquil entirely as this was a likely cause for your confusion. Stop taking abilify as this also contributed to your confusion Continue taking buspar 15 mg twice a day, the decreased dose is to help prevent severe confusion  Continue taking cymbalta 30 mg once a day, the decreased dose is due to help prevent severe confusion Continue taking keflex 500 mg twice a day for 4 more days for your UTI Be sure to follow up with psychiatry, neurology, and your primary care provider  If you have a repeat episode of seizure, have severe confusion, please go to the nearest ED as soon as possible.

## 2021-06-16 LAB — CULTURE, BLOOD (ROUTINE X 2)
Culture: NO GROWTH
Culture: NO GROWTH
Special Requests: ADEQUATE
Special Requests: ADEQUATE

## 2021-06-18 NOTE — Telephone Encounter (Signed)
I called [facility] PA # [id number] good from 06-05-21 thru 09-04-2021 emaglity 120mg  / ML

## 2021-06-19 ENCOUNTER — Emergency Department (HOSPITAL_COMMUNITY): Payer: Medicaid Other

## 2021-06-19 ENCOUNTER — Emergency Department (HOSPITAL_COMMUNITY)
Admission: EM | Admit: 2021-06-19 | Discharge: 2021-06-20 | Disposition: A | Discharge status: Home / Self Care | Payer: Medicaid Other | Attending: Emergency Medicine | Admitting: Emergency Medicine

## 2021-06-19 ENCOUNTER — Encounter (HOSPITAL_COMMUNITY): Payer: Self-pay

## 2021-06-19 ENCOUNTER — Other Ambulatory Visit: Payer: Self-pay

## 2021-06-19 DIAGNOSIS — R1110 Vomiting, unspecified: Secondary | ICD-10-CM | POA: Insufficient documentation

## 2021-06-19 DIAGNOSIS — R109 Unspecified abdominal pain: Secondary | ICD-10-CM

## 2021-06-19 DIAGNOSIS — R059 Cough, unspecified: Secondary | ICD-10-CM | POA: Insufficient documentation

## 2021-06-19 DIAGNOSIS — R509 Fever, unspecified: Secondary | ICD-10-CM | POA: Diagnosis not present

## 2021-06-19 DIAGNOSIS — R1084 Generalized abdominal pain: Secondary | ICD-10-CM | POA: Diagnosis not present

## 2021-06-19 DIAGNOSIS — Z20822 Contact with and (suspected) exposure to covid-19: Secondary | ICD-10-CM | POA: Diagnosis not present

## 2021-06-19 DIAGNOSIS — N9489 Other specified conditions associated with female genital organs and menstrual cycle: Secondary | ICD-10-CM | POA: Diagnosis not present

## 2021-06-19 DIAGNOSIS — Z87891 Personal history of nicotine dependence: Secondary | ICD-10-CM | POA: Diagnosis not present

## 2021-06-19 DIAGNOSIS — Z7951 Long term (current) use of inhaled steroids: Secondary | ICD-10-CM | POA: Diagnosis not present

## 2021-06-19 DIAGNOSIS — R0789 Other chest pain: Secondary | ICD-10-CM | POA: Diagnosis not present

## 2021-06-19 DIAGNOSIS — R000 Tachycardia, unspecified: Secondary | ICD-10-CM | POA: Insufficient documentation

## 2021-06-19 LAB — CBC
HCT: 36.9 % (ref 36.0–46.0)
Hemoglobin: 12.4 g/dL (ref 12.0–15.0)
MCH: 31 pg (ref 26.0–34.0)
MCHC: 33.6 g/dL (ref 30.0–36.0)
MCV: 92.3 fL (ref 80.0–100.0)
Platelets: 219 10*3/uL (ref 150–400)
RBC: 4 MIL/uL (ref 3.87–5.11)
RDW: 13.5 % (ref 11.5–15.5)
WBC: 7.5 10*3/uL (ref 4.0–10.5)
nRBC: 0 % (ref 0.0–0.2)

## 2021-06-19 LAB — I-STAT BETA HCG BLOOD, ED (MC, WL, AP ONLY): I-stat hCG, quantitative: <5 m[IU]/mL (ref <5)

## 2021-06-19 LAB — BASIC METABOLIC PANEL
Anion gap: 8 (ref 5–15)
BUN: 16 mg/dL (ref 6–20)
CO2: 30 mmol/L (ref 22–32)
Calcium: 9.1 mg/dL (ref 8.9–10.3)
Chloride: 101 mmol/L (ref 98–111)
Creatinine, Ser: 0.91 mg/dL (ref 0.44–1.00)
GFR, Estimated: >60 mL/min (ref >60)
Glucose, Bld: 97 mg/dL (ref 70–99)
Potassium: 3.2 mmol/L — ABNORMAL LOW (ref 3.5–5.1)
Sodium: 139 mmol/L (ref 135–145)

## 2021-06-19 LAB — TROPONIN I (HIGH SENSITIVITY): Troponin I (High Sensitivity): 3 ng/L (ref <18)
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
## 2021-06-20 ENCOUNTER — Encounter (HOSPITAL_COMMUNITY): Payer: Self-pay

## 2021-06-20 ENCOUNTER — Emergency Department (HOSPITAL_COMMUNITY): Payer: Medicaid Other

## 2021-06-20 LAB — URINALYSIS, ROUTINE W REFLEX MICROSCOPIC
Bilirubin Urine: NEGATIVE
Glucose, UA: NEGATIVE mg/dL
Hgb urine dipstick: NEGATIVE
Ketones, ur: NEGATIVE mg/dL
Leukocytes,Ua: NEGATIVE
Nitrite: NEGATIVE
Protein, ur: NEGATIVE mg/dL
Specific Gravity, Urine: 1.006 (ref 1.005–1.030)
pH: 6 (ref 5.0–8.0)

## 2021-06-20 LAB — RESP PANEL BY RT-PCR (FLU A&B, COVID) ARPGX2
Influenza A by PCR: NEGATIVE
Influenza B by PCR: NEGATIVE
SARS Coronavirus 2 by RT PCR: NEGATIVE

## 2021-06-20 LAB — LIPASE, BLOOD: Lipase: 21 U/L (ref 11–51)

## 2021-06-20 LAB — HEPATIC FUNCTION PANEL
ALT: 78 U/L — ABNORMAL HIGH (ref 0–44)
AST: 64 U/L — ABNORMAL HIGH (ref 15–41)
Albumin: 3.9 g/dL (ref 3.5–5.0)
Alkaline Phosphatase: 190 U/L — ABNORMAL HIGH (ref 38–126)
Bilirubin, Direct: 0.1 mg/dL (ref 0.0–0.2)
Indirect Bilirubin: 0.2 mg/dL — ABNORMAL LOW (ref 0.3–0.9)
Total Bilirubin: 0.3 mg/dL (ref 0.3–1.2)
Total Protein: 7 g/dL (ref 6.5–8.1)

## 2021-06-20 LAB — D-DIMER, QUANTITATIVE: D-Dimer, Quant: 0.91 ug/mL-FEU — ABNORMAL HIGH (ref 0.00–0.50)

## 2021-06-20 MED ORDER — LACTATED RINGERS IV BOLUS
1000.0000 mL | Freq: Once | INTRAVENOUS | Status: AC
  Administered 2021-06-20: 1000 mL via INTRAVENOUS

## 2021-06-20 MED ORDER — POTASSIUM CHLORIDE CRYS ER 20 MEQ PO TBCR
40.0000 meq | EXTENDED_RELEASE_TABLET | Freq: Once | ORAL | Status: AC
  Administered 2021-06-20: 40 meq via ORAL
  Filled 2021-06-20: qty 2

## 2021-06-20 MED ORDER — SODIUM CHLORIDE (PF) 0.9 % IJ SOLN
INTRAMUSCULAR | Status: AC
  Filled 2021-06-20: qty 50

## 2021-06-20 MED ORDER — IOHEXOL 350 MG/ML SOLN
80.0000 mL | Freq: Once | INTRAVENOUS | Status: AC | PRN
  Administered 2021-06-20: 80 mL via INTRAVENOUS

## 2021-06-20 MED ORDER — FENTANYL CITRATE PF 50 MCG/ML IJ SOSY
100.0000 ug | PREFILLED_SYRINGE | Freq: Once | INTRAMUSCULAR | Status: AC
  Administered 2021-06-20: 100 ug via INTRAVENOUS
  Filled 2021-06-20: qty 2

## 2021-06-20 MED ORDER — ONDANSETRON HCL 4 MG/2ML IJ SOLN
4.0000 mg | Freq: Once | INTRAMUSCULAR | Status: AC
  Administered 2021-06-20: 4 mg via INTRAVENOUS
  Filled 2021-06-20: qty 2

## 2021-06-20 MED ORDER — KETOROLAC TROMETHAMINE 15 MG/ML IJ SOLN
15.0000 mg | Freq: Once | INTRAMUSCULAR | Status: AC
  Administered 2021-06-20: 15 mg via INTRAVENOUS
  Filled 2021-06-20: qty 1

## 2021-06-20 MED ORDER — HYDROMORPHONE HCL 1 MG/ML IJ SOLN
1.0000 mg | Freq: Once | INTRAMUSCULAR | Status: AC
Start: 2021-06-20 — End: 2021-06-20
  Administered 2021-06-20: 1 mg via INTRAVENOUS
  Filled 2021-06-20: qty 1
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
## 2021-06-20 NOTE — Discharge Instructions (Addendum)
If you develop worsening, continued, or recurrent abdominal pain, uncontrolled vomiting, fever, chest or back pain, or any other new/concerning symptoms then return to the ER for evaluation.  

## 2021-08-16 ENCOUNTER — Encounter (HOSPITAL_COMMUNITY): Payer: Self-pay

## 2021-08-16 ENCOUNTER — Emergency Department (HOSPITAL_COMMUNITY)
Admission: EM | Admit: 2021-08-16 | Discharge: 2021-08-17 | Disposition: A | Discharge status: Home / Self Care | Payer: Medicaid Other | Attending: Emergency Medicine | Admitting: Emergency Medicine

## 2021-08-16 ENCOUNTER — Other Ambulatory Visit: Payer: Self-pay

## 2021-08-16 DIAGNOSIS — Z87891 Personal history of nicotine dependence: Secondary | ICD-10-CM | POA: Insufficient documentation

## 2021-08-16 DIAGNOSIS — R1013 Epigastric pain: Secondary | ICD-10-CM | POA: Diagnosis not present

## 2021-08-16 DIAGNOSIS — N3000 Acute cystitis without hematuria: Secondary | ICD-10-CM

## 2021-08-16 DIAGNOSIS — Z8719 Personal history of other diseases of the digestive system: Secondary | ICD-10-CM

## 2021-08-16 LAB — URINALYSIS, ROUTINE W REFLEX MICROSCOPIC
Bilirubin Urine: NEGATIVE
Glucose, UA: NEGATIVE mg/dL
Hgb urine dipstick: NEGATIVE
Ketones, ur: 20 mg/dL — AB
Nitrite: NEGATIVE
Protein, ur: 30 mg/dL — AB
Specific Gravity, Urine: 1.02 (ref 1.005–1.030)
WBC, UA: >50 WBC/hpf — ABNORMAL HIGH (ref 0–5)
pH: 5 (ref 5.0–8.0)

## 2021-08-16 LAB — COMPREHENSIVE METABOLIC PANEL
ALT: 28 U/L (ref 0–44)
AST: 25 U/L (ref 15–41)
Albumin: 4.5 g/dL (ref 3.5–5.0)
Alkaline Phosphatase: 178 U/L — ABNORMAL HIGH (ref 38–126)
Anion gap: 11 (ref 5–15)
BUN: 15 mg/dL (ref 6–20)
CO2: 24 mmol/L (ref 22–32)
Calcium: 9.5 mg/dL (ref 8.9–10.3)
Chloride: 98 mmol/L (ref 98–111)
Creatinine, Ser: 0.79 mg/dL (ref 0.44–1.00)
GFR, Estimated: >60 mL/min (ref >60)
Glucose, Bld: 91 mg/dL (ref 70–99)
Potassium: 3.2 mmol/L — ABNORMAL LOW (ref 3.5–5.1)
Sodium: 133 mmol/L — ABNORMAL LOW (ref 135–145)
Total Bilirubin: 0.8 mg/dL (ref 0.3–1.2)
Total Protein: 8.1 g/dL (ref 6.5–8.1)

## 2021-08-16 LAB — CBC WITH DIFFERENTIAL/PLATELET
Abs Immature Granulocytes: 0.05 10*3/uL (ref 0.00–0.07)
Basophils Absolute: 0.1 10*3/uL (ref 0.0–0.1)
Basophils Relative: 1 %
Eosinophils Absolute: 0.1 10*3/uL (ref 0.0–0.5)
Eosinophils Relative: 1 %
HCT: 50.4 % — ABNORMAL HIGH (ref 36.0–46.0)
Hemoglobin: 16.6 g/dL — ABNORMAL HIGH (ref 12.0–15.0)
Immature Granulocytes: 0 %
Lymphocytes Relative: 20 %
Lymphs Abs: 2.5 10*3/uL (ref 0.7–4.0)
MCH: 29.6 pg (ref 26.0–34.0)
MCHC: 32.9 g/dL (ref 30.0–36.0)
MCV: 89.8 fL (ref 80.0–100.0)
Monocytes Absolute: 0.7 10*3/uL (ref 0.1–1.0)
Monocytes Relative: 6 %
Neutro Abs: 8.8 10*3/uL — ABNORMAL HIGH (ref 1.7–7.7)
Neutrophils Relative %: 72 %
Platelets: 388 10*3/uL (ref 150–400)
RBC: 5.61 MIL/uL — ABNORMAL HIGH (ref 3.87–5.11)
RDW: 13.1 % (ref 11.5–15.5)
WBC: 12.3 10*3/uL — ABNORMAL HIGH (ref 4.0–10.5)
nRBC: 0 % (ref 0.0–0.2)

## 2021-08-16 LAB — LIPASE, BLOOD: Lipase: 25 U/L (ref 11–51)

## 2021-08-16 NOTE — ED Triage Notes (Signed)
Pt states that she feels like she has pancreatitis again. Pt has upper quadrant abdominal pain x 4 days with nausea and vomiting.

## 2021-08-16 NOTE — ED Provider Notes (Signed)
Emergency Medicine Provider Triage Evaluation Note  Tricia Brown , a 45 y.o. female  was evaluated in triage.  Pt complains of who presents with concern for epigastric and RUQ pain. Hx of pancreatitis, chronic alcohol use.   Review of Systems  Positive: Epigastric pain, N/V Negative: Fever chills, diarrhea  Physical Exam  BP (!) 120/101 (BP Location: Left Arm)   Pulse 97   Temp 98.2 F (36.8 C) (Oral)   Resp 16   Ht 5\' 7"  (1.702 m)   Wt 68 kg   SpO2 94%   BMI 23.49 kg/m  Gen:   Awake, no distress   Resp:  Normal effort  MSK:   Moves extremities without difficulty  Other:  Epigastric, RUQ pain, RRR no m/r/g  Medical Decision Making  Medically screening exam initiated at 10:05 PM.  Appropriate orders placed.  Glyn Zendejas was informed that the remainder of the evaluation will be completed by another provider, this initial triage assessment does not replace that evaluation, and the importance of remaining in the ED until their evaluation is complete.  This chart was dictated using voice recognition software, Dragon. Despite the best efforts of this provider to proofread and correct errors, errors may still occur which can change documentation meaning.    [name] 08/16/21 2208    2209, MD 08/17/21 1346

## 2021-08-17 ENCOUNTER — Encounter (HOSPITAL_COMMUNITY): Payer: Self-pay

## 2021-08-17 ENCOUNTER — Emergency Department (HOSPITAL_COMMUNITY): Payer: Medicaid Other

## 2021-08-17 MED ORDER — SODIUM CHLORIDE 0.9 % IV SOLN
1.0000 g | Freq: Once | INTRAVENOUS | Status: AC
  Administered 2021-08-17: 02:00:00 1 g via INTRAVENOUS
  Filled 2021-08-17: qty 10

## 2021-08-17 MED ORDER — SODIUM CHLORIDE 0.9 % IV BOLUS
1000.0000 mL | Freq: Once | INTRAVENOUS | Status: AC
  Administered 2021-08-17: 01:00:00 1000 mL via INTRAVENOUS

## 2021-08-17 MED ORDER — ONDANSETRON 8 MG PO TBDP: ORAL_TABLET | ORAL | 0 refills | Status: DC

## 2021-08-17 MED ORDER — IOHEXOL 350 MG/ML SOLN
80.0000 mL | Freq: Once | INTRAVENOUS | Status: AC | PRN
  Administered 2021-08-17: 02:00:00 80 mL via INTRAVENOUS

## 2021-08-17 MED ORDER — CEPHALEXIN 500 MG PO CAPS: 500.0000 mg | ORAL_CAPSULE | Freq: Three times a day (TID) | ORAL | 0 refills | Status: DC

## 2021-08-17 MED ORDER — ONDANSETRON HCL 4 MG/2ML IJ SOLN
4.0000 mg | Freq: Once | INTRAMUSCULAR | Status: AC
  Administered 2021-08-17: 01:00:00 4 mg via INTRAVENOUS
  Filled 2021-08-17: qty 2

## 2021-08-17 MED ORDER — MORPHINE SULFATE (PF) 4 MG/ML IV SOLN
4.0000 mg | Freq: Once | INTRAVENOUS | Status: AC
Start: 2021-08-17 — End: 2021-08-17
  Administered 2021-08-17: 01:00:00 4 mg via INTRAVENOUS
  Filled 2021-08-17: qty 1
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
## 2021-08-17 NOTE — Discharge Instructions (Signed)
Begin taking Zofran as prescribed as needed for nausea.  Begin taking Keflex as prescribed for treatment of urinary tract infection.  Clear liquid diet for the next 24 hours, then advance to normal as tolerated.  Follow-up with primary doctor if not improving in the next week, and return to the ER if symptoms significantly worsen or change.

## 2021-08-31 ENCOUNTER — Encounter (HOSPITAL_COMMUNITY): Payer: Self-pay

## 2021-08-31 ENCOUNTER — Emergency Department (HOSPITAL_COMMUNITY): Payer: Medicaid Other

## 2021-08-31 ENCOUNTER — Inpatient Hospital Stay (HOSPITAL_COMMUNITY)
Admission: EM | Admit: 2021-08-31 | Discharge: 2021-09-03 | DRG: 441 | Disposition: A | Discharge status: Home / Self Care | Payer: Medicaid Other | Attending: Internal Medicine | Admitting: Internal Medicine

## 2021-08-31 ENCOUNTER — Other Ambulatory Visit: Payer: Self-pay

## 2021-08-31 DIAGNOSIS — B179 Acute viral hepatitis, unspecified: Secondary | ICD-10-CM

## 2021-08-31 DIAGNOSIS — K219 Gastro-esophageal reflux disease without esophagitis: Secondary | ICD-10-CM | POA: Diagnosis present

## 2021-08-31 DIAGNOSIS — F1021 Alcohol dependence, in remission: Secondary | ICD-10-CM | POA: Diagnosis present

## 2021-08-31 DIAGNOSIS — K7200 Acute and subacute hepatic failure without coma: Secondary | ICD-10-CM | POA: Diagnosis present

## 2021-08-31 DIAGNOSIS — R7401 Elevation of levels of liver transaminase levels: Secondary | ICD-10-CM

## 2021-08-31 DIAGNOSIS — K712 Toxic liver disease with acute hepatitis: Principal | ICD-10-CM | POA: Diagnosis present

## 2021-08-31 DIAGNOSIS — R197 Diarrhea, unspecified: Secondary | ICD-10-CM

## 2021-08-31 DIAGNOSIS — Z7951 Long term (current) use of inhaled steroids: Secondary | ICD-10-CM

## 2021-08-31 DIAGNOSIS — G43909 Migraine, unspecified, not intractable, without status migrainosus: Secondary | ICD-10-CM | POA: Diagnosis present

## 2021-08-31 DIAGNOSIS — R112 Nausea with vomiting, unspecified: Secondary | ICD-10-CM

## 2021-08-31 DIAGNOSIS — F1091 Alcohol use, unspecified, in remission: Secondary | ICD-10-CM | POA: Diagnosis present

## 2021-08-31 DIAGNOSIS — G8929 Other chronic pain: Secondary | ICD-10-CM | POA: Diagnosis present

## 2021-08-31 DIAGNOSIS — K861 Other chronic pancreatitis: Secondary | ICD-10-CM | POA: Diagnosis present

## 2021-08-31 DIAGNOSIS — F1020 Alcohol dependence, uncomplicated: Secondary | ICD-10-CM | POA: Diagnosis not present

## 2021-08-31 DIAGNOSIS — Z833 Family history of diabetes mellitus: Secondary | ICD-10-CM

## 2021-08-31 DIAGNOSIS — F329 Major depressive disorder, single episode, unspecified: Secondary | ICD-10-CM | POA: Diagnosis present

## 2021-08-31 DIAGNOSIS — Z79899 Other long term (current) drug therapy: Secondary | ICD-10-CM

## 2021-08-31 DIAGNOSIS — T43215A Adverse effect of selective serotonin and norepinephrine reuptake inhibitors, initial encounter: Secondary | ICD-10-CM | POA: Diagnosis present

## 2021-08-31 DIAGNOSIS — Z87891 Personal history of nicotine dependence: Secondary | ICD-10-CM

## 2021-08-31 DIAGNOSIS — F32A Depression, unspecified: Secondary | ICD-10-CM | POA: Diagnosis present

## 2021-08-31 DIAGNOSIS — F1120 Opioid dependence, uncomplicated: Secondary | ICD-10-CM | POA: Diagnosis not present

## 2021-08-31 DIAGNOSIS — K589 Irritable bowel syndrome without diarrhea: Secondary | ICD-10-CM | POA: Diagnosis present

## 2021-08-31 DIAGNOSIS — Z20822 Contact with and (suspected) exposure to covid-19: Secondary | ICD-10-CM | POA: Diagnosis present

## 2021-08-31 HISTORY — DX: Other chronic pancreatitis: K86.1

## 2021-08-31 LAB — ACETAMINOPHEN LEVEL: Acetaminophen (Tylenol), Serum: <10 ug/mL — ABNORMAL LOW (ref 10–30)

## 2021-08-31 LAB — RESP PANEL BY RT-PCR (FLU A&B, COVID) ARPGX2
Influenza A by PCR: NEGATIVE
Influenza B by PCR: NEGATIVE
SARS Coronavirus 2 by RT PCR: NEGATIVE

## 2021-08-31 LAB — COMPREHENSIVE METABOLIC PANEL
ALT: 2815 U/L — ABNORMAL HIGH (ref 0–44)
AST: 2564 U/L — ABNORMAL HIGH (ref 15–41)
Albumin: 4.4 g/dL (ref 3.5–5.0)
Alkaline Phosphatase: 145 U/L — ABNORMAL HIGH (ref 38–126)
Anion gap: 15 (ref 5–15)
BUN: 23 mg/dL — ABNORMAL HIGH (ref 6–20)
CO2: 21 mmol/L — ABNORMAL LOW (ref 22–32)
Calcium: 9.3 mg/dL (ref 8.9–10.3)
Chloride: 98 mmol/L (ref 98–111)
Creatinine, Ser: 0.88 mg/dL (ref 0.44–1.00)
GFR, Estimated: >60 mL/min (ref >60)
Glucose, Bld: 124 mg/dL — ABNORMAL HIGH (ref 70–99)
Potassium: 3.2 mmol/L — ABNORMAL LOW (ref 3.5–5.1)
Sodium: 134 mmol/L — ABNORMAL LOW (ref 135–145)
Total Bilirubin: 1.9 mg/dL — ABNORMAL HIGH (ref 0.3–1.2)
Total Protein: 7.7 g/dL (ref 6.5–8.1)

## 2021-08-31 LAB — CBC WITH DIFFERENTIAL/PLATELET
Abs Immature Granulocytes: 0.05 10*3/uL (ref 0.00–0.07)
Basophils Absolute: 0 10*3/uL (ref 0.0–0.1)
Basophils Relative: 0 %
Eosinophils Absolute: 0 10*3/uL (ref 0.0–0.5)
Eosinophils Relative: 0 %
HCT: 47.7 % — ABNORMAL HIGH (ref 36.0–46.0)
Hemoglobin: 16.2 g/dL — ABNORMAL HIGH (ref 12.0–15.0)
Immature Granulocytes: 0 %
Lymphocytes Relative: 6 %
Lymphs Abs: 0.7 10*3/uL (ref 0.7–4.0)
MCH: 29.5 pg (ref 26.0–34.0)
MCHC: 34 g/dL (ref 30.0–36.0)
MCV: 86.7 fL (ref 80.0–100.0)
Monocytes Absolute: 1 10*3/uL (ref 0.1–1.0)
Monocytes Relative: 9 %
Neutro Abs: 10 10*3/uL — ABNORMAL HIGH (ref 1.7–7.7)
Neutrophils Relative %: 85 %
Platelets: 211 10*3/uL (ref 150–400)
RBC: 5.5 MIL/uL — ABNORMAL HIGH (ref 3.87–5.11)
RDW: 13.2 % (ref 11.5–15.5)
WBC: 11.8 10*3/uL — ABNORMAL HIGH (ref 4.0–10.5)
nRBC: 0 % (ref 0.0–0.2)

## 2021-08-31 LAB — LIPASE, BLOOD: Lipase: 26 U/L (ref 11–51)

## 2021-08-31 LAB — I-STAT BETA HCG BLOOD, ED (MC, WL, AP ONLY): I-stat hCG, quantitative: 6.8 m[IU]/mL — ABNORMAL HIGH (ref <5)

## 2021-08-31 LAB — PROTIME-INR
INR: 1.2 (ref 0.8–1.2)
Prothrombin Time: 14.7 seconds (ref 11.4–15.2)

## 2021-08-31 LAB — PREGNANCY, URINE: Preg Test, Ur: NEGATIVE

## 2021-08-31 LAB — LACTIC ACID, PLASMA
Lactic Acid, Venous: 1.6 mmol/L (ref 0.5–1.9)
Lactic Acid, Venous: 1.9 mmol/L (ref 0.5–1.9)

## 2021-08-31 LAB — ETHANOL: Alcohol, Ethyl (B): <10 mg/dL (ref <10)

## 2021-08-31 MED ORDER — ALBUTEROL SULFATE (2.5 MG/3ML) 0.083% IN NEBU: 3.0000 mL | INHALATION_SOLUTION | Freq: Four times a day (QID) | RESPIRATORY_TRACT | Status: DC | PRN

## 2021-08-31 MED ORDER — IOHEXOL 350 MG/ML SOLN
80.0000 mL | Freq: Once | INTRAVENOUS | Status: AC | PRN
  Administered 2021-08-31: 80 mL via INTRAVENOUS

## 2021-08-31 MED ORDER — SODIUM CHLORIDE 0.9 % IV BOLUS
1000.0000 mL | Freq: Once | INTRAVENOUS | Status: AC
  Administered 2021-08-31: 1000 mL via INTRAVENOUS

## 2021-08-31 MED ORDER — PANTOPRAZOLE SODIUM 40 MG IV SOLR
40.0000 mg | Freq: Once | INTRAVENOUS | Status: AC
  Administered 2021-08-31: 40 mg via INTRAVENOUS
  Filled 2021-08-31: qty 40

## 2021-08-31 MED ORDER — ONDANSETRON HCL 4 MG/2ML IJ SOLN
4.0000 mg | Freq: Once | INTRAMUSCULAR | Status: AC
  Administered 2021-08-31: 4 mg via INTRAVENOUS
  Filled 2021-08-31: qty 2

## 2021-08-31 MED ORDER — MOMETASONE FURO-FORMOTEROL FUM 100-5 MCG/ACT IN AERO
2.0000 | INHALATION_SPRAY | Freq: Two times a day (BID) | RESPIRATORY_TRACT | Status: DC
  Administered 2021-09-01 – 2021-09-03 (×5): 2 via RESPIRATORY_TRACT
  Filled 2021-08-31: qty 8.8

## 2021-08-31 MED ORDER — MORPHINE SULFATE (PF) 2 MG/ML IV SOLN
2.0000 mg | INTRAVENOUS | Status: DC | PRN
Start: 2021-08-31 — End: 2021-09-02
  Administered 2021-08-31: 4 mg via INTRAVENOUS
  Administered 2021-09-01 – 2021-09-02 (×7): 2 mg via INTRAVENOUS
  Filled 2021-08-31 (×3): qty 1
  Filled 2021-08-31: qty 2
  Filled 2021-08-31 (×4): qty 1

## 2021-08-31 MED ORDER — MORPHINE SULFATE (PF) 4 MG/ML IV SOLN
4.0000 mg | Freq: Once | INTRAVENOUS | Status: AC
  Administered 2021-08-31: 4 mg via INTRAVENOUS
  Filled 2021-08-31: qty 1

## 2021-08-31 MED ORDER — PROMETHAZINE HCL 25 MG/ML IJ SOLN
12.5000 mg | Freq: Four times a day (QID) | INTRAMUSCULAR | Status: DC | PRN
  Administered 2021-08-31 – 2021-09-01 (×2): 12.5 mg via INTRAMUSCULAR
  Filled 2021-08-31 (×2): qty 1

## 2021-08-31 MED ORDER — POTASSIUM CHLORIDE 10 MEQ/100ML IV SOLN
10.0000 meq | INTRAVENOUS | Status: AC
  Administered 2021-08-31 – 2021-09-01 (×3): 10 meq via INTRAVENOUS
  Filled 2021-08-31 (×3): qty 100

## 2021-08-31 MED ORDER — LACTATED RINGERS IV SOLN: INTRAVENOUS | Status: DC

## 2021-08-31 MED ORDER — ONDANSETRON HCL 4 MG/2ML IJ SOLN
4.0000 mg | Freq: Four times a day (QID) | INTRAMUSCULAR | Status: DC | PRN
  Administered 2021-08-31 – 2021-09-01 (×4): 4 mg via INTRAVENOUS
  Filled 2021-08-31 (×4): qty 2

## 2021-08-31 MED ORDER — PANTOPRAZOLE SODIUM 40 MG PO TBEC
40.0000 mg | DELAYED_RELEASE_TABLET | Freq: Every day | ORAL | Status: DC
  Administered 2021-09-01 – 2021-09-03 (×3): 40 mg via ORAL
  Filled 2021-08-31 (×3): qty 1

## 2021-08-31 MED ORDER — ENOXAPARIN SODIUM 40 MG/0.4ML IJ SOSY
40.0000 mg | PREFILLED_SYRINGE | INTRAMUSCULAR | Status: DC
  Administered 2021-08-31 – 2021-09-02 (×3): 40 mg via SUBCUTANEOUS
  Filled 2021-08-31 (×3): qty 0.4

## 2021-08-31 MED ORDER — CYCLOSPORINE 0.05 % OP EMUL
1.0000 [drp] | Freq: Two times a day (BID) | OPHTHALMIC | Status: DC
  Administered 2021-09-01 – 2021-09-03 (×5): 1 [drp] via OPHTHALMIC
  Filled 2021-08-31 (×5): qty 30

## 2021-08-31 MED ORDER — FLUOROMETHOLONE 0.1 % OP SUSP
1.0000 [drp] | Freq: Two times a day (BID) | OPHTHALMIC | Status: DC
  Administered 2021-09-01 – 2021-09-03 (×5): 1 [drp] via OPHTHALMIC
  Filled 2021-08-31: qty 5
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
## 2021-08-31 NOTE — ED Notes (Signed)
Patient transported to CT 
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
## 2021-08-31 NOTE — ED Triage Notes (Signed)
Pt c/o nausea, vomiting, diarrhea, and upper abdominal pain. Pt reports burning sensation in her chest as well. The pt has hx of pancreatitis.

## 2021-08-31 NOTE — ED Notes (Signed)
Patient's fluid rate increased to 125 mL/hr for patient comfort during K+ administration

## 2021-09-01 ENCOUNTER — Other Ambulatory Visit: Payer: Self-pay | Admitting: Gastroenterology

## 2021-09-01 ENCOUNTER — Encounter (HOSPITAL_COMMUNITY): Payer: Self-pay | Admitting: Internal Medicine

## 2021-09-01 DIAGNOSIS — R109 Unspecified abdominal pain: Secondary | ICD-10-CM | POA: Diagnosis present

## 2021-09-01 DIAGNOSIS — Z20822 Contact with and (suspected) exposure to covid-19: Secondary | ICD-10-CM | POA: Diagnosis present

## 2021-09-01 DIAGNOSIS — K219 Gastro-esophageal reflux disease without esophagitis: Secondary | ICD-10-CM | POA: Diagnosis present

## 2021-09-01 DIAGNOSIS — R112 Nausea with vomiting, unspecified: Secondary | ICD-10-CM

## 2021-09-01 DIAGNOSIS — B179 Acute viral hepatitis, unspecified: Secondary | ICD-10-CM | POA: Diagnosis not present

## 2021-09-01 DIAGNOSIS — K861 Other chronic pancreatitis: Secondary | ICD-10-CM | POA: Diagnosis present

## 2021-09-01 DIAGNOSIS — G8929 Other chronic pain: Secondary | ICD-10-CM | POA: Diagnosis present

## 2021-09-01 DIAGNOSIS — R7401 Elevation of levels of liver transaminase levels: Secondary | ICD-10-CM | POA: Diagnosis not present

## 2021-09-01 DIAGNOSIS — F32A Depression, unspecified: Secondary | ICD-10-CM | POA: Diagnosis present

## 2021-09-01 DIAGNOSIS — F1021 Alcohol dependence, in remission: Secondary | ICD-10-CM | POA: Diagnosis present

## 2021-09-01 DIAGNOSIS — Z87891 Personal history of nicotine dependence: Secondary | ICD-10-CM | POA: Diagnosis not present

## 2021-09-01 DIAGNOSIS — G43909 Migraine, unspecified, not intractable, without status migrainosus: Secondary | ICD-10-CM | POA: Diagnosis present

## 2021-09-01 DIAGNOSIS — K7200 Acute and subacute hepatic failure without coma: Secondary | ICD-10-CM | POA: Diagnosis present

## 2021-09-01 DIAGNOSIS — K712 Toxic liver disease with acute hepatitis: Secondary | ICD-10-CM | POA: Diagnosis not present

## 2021-09-01 DIAGNOSIS — F1120 Opioid dependence, uncomplicated: Secondary | ICD-10-CM | POA: Diagnosis present

## 2021-09-01 DIAGNOSIS — Z833 Family history of diabetes mellitus: Secondary | ICD-10-CM | POA: Diagnosis not present

## 2021-09-01 DIAGNOSIS — T43215A Adverse effect of selective serotonin and norepinephrine reuptake inhibitors, initial encounter: Secondary | ICD-10-CM | POA: Diagnosis present

## 2021-09-01 DIAGNOSIS — K589 Irritable bowel syndrome without diarrhea: Secondary | ICD-10-CM | POA: Diagnosis present

## 2021-09-01 DIAGNOSIS — F1091 Alcohol use, unspecified, in remission: Secondary | ICD-10-CM | POA: Diagnosis not present

## 2021-09-01 DIAGNOSIS — Z7951 Long term (current) use of inhaled steroids: Secondary | ICD-10-CM | POA: Diagnosis not present

## 2021-09-01 DIAGNOSIS — Z79899 Other long term (current) drug therapy: Secondary | ICD-10-CM | POA: Diagnosis not present

## 2021-09-01 LAB — URINALYSIS, ROUTINE W REFLEX MICROSCOPIC
Bacteria, UA: NONE SEEN
Bilirubin Urine: NEGATIVE
Glucose, UA: NEGATIVE mg/dL
Ketones, ur: 80 mg/dL — AB
Nitrite: NEGATIVE
Protein, ur: 30 mg/dL — AB
Specific Gravity, Urine: 1.01 (ref 1.005–1.030)
pH: 6 (ref 5.0–8.0)

## 2021-09-01 LAB — CBC
HCT: 39.3 % (ref 36.0–46.0)
Hemoglobin: 13.6 g/dL (ref 12.0–15.0)
MCH: 29.9 pg (ref 26.0–34.0)
MCHC: 34.6 g/dL (ref 30.0–36.0)
MCV: 86.4 fL (ref 80.0–100.0)
Platelets: 185 10*3/uL (ref 150–400)
RBC: 4.55 MIL/uL (ref 3.87–5.11)
RDW: 13.4 % (ref 11.5–15.5)
WBC: 8.7 10*3/uL (ref 4.0–10.5)
nRBC: 0 % (ref 0.0–0.2)

## 2021-09-01 LAB — COMPREHENSIVE METABOLIC PANEL
ALT: 1995 U/L — ABNORMAL HIGH (ref 0–44)
AST: 1272 U/L — ABNORMAL HIGH (ref 15–41)
Albumin: 3.5 g/dL (ref 3.5–5.0)
Alkaline Phosphatase: 114 U/L (ref 38–126)
Anion gap: 11 (ref 5–15)
BUN: 17 mg/dL (ref 6–20)
CO2: 20 mmol/L — ABNORMAL LOW (ref 22–32)
Calcium: 8.6 mg/dL — ABNORMAL LOW (ref 8.9–10.3)
Chloride: 104 mmol/L (ref 98–111)
Creatinine, Ser: 0.66 mg/dL (ref 0.44–1.00)
GFR, Estimated: >60 mL/min (ref >60)
Glucose, Bld: 78 mg/dL (ref 70–99)
Potassium: 3.8 mmol/L (ref 3.5–5.1)
Sodium: 135 mmol/L (ref 135–145)
Total Bilirubin: 1.7 mg/dL — ABNORMAL HIGH (ref 0.3–1.2)
Total Protein: 6.1 g/dL — ABNORMAL LOW (ref 6.5–8.1)

## 2021-09-01 LAB — URINE CULTURE: Culture: >=10000 — AB

## 2021-09-01 LAB — HEPATITIS PANEL, ACUTE
HCV Ab: NONREACTIVE
Hep A IgM: NONREACTIVE
Hep B C IgM: NONREACTIVE
Hepatitis B Surface Ag: NONREACTIVE

## 2021-09-01 LAB — HIV ANTIBODY (ROUTINE TESTING W REFLEX): HIV Screen 4th Generation wRfx: NONREACTIVE

## 2021-09-01 MED ORDER — DULOXETINE HCL 60 MG PO CPEP: 60.0000 mg | ORAL_CAPSULE | Freq: Every day | ORAL | Status: DC

## 2021-09-01 MED ORDER — PANCRELIPASE (LIP-PROT-AMYL) 12000-38000 UNITS PO CPEP
36000.0000 [IU] | ORAL_CAPSULE | Freq: Three times a day (TID) | ORAL | Status: DC
Start: 2021-09-01 — End: 2021-09-03
  Administered 2021-09-01 – 2021-09-03 (×6): 36000 [IU] via ORAL
  Filled 2021-09-01 (×6): qty 3

## 2021-09-01 MED ORDER — GABAPENTIN 300 MG PO CAPS
600.0000 mg | ORAL_CAPSULE | Freq: Three times a day (TID) | ORAL | Status: DC
  Administered 2021-09-01 – 2021-09-03 (×7): 600 mg via ORAL
  Filled 2021-09-01 (×7): qty 2

## 2021-09-01 MED ORDER — MORPHINE SULFATE (PF) 2 MG/ML IV SOLN
2.0000 mg | Freq: Once | INTRAVENOUS | Status: AC
  Administered 2021-09-01: 2 mg via INTRAVENOUS
  Filled 2021-09-01: qty 1

## 2021-09-01 MED ORDER — BUPRENORPHINE HCL-NALOXONE HCL 8-2 MG SL SUBL
1.0000 | SUBLINGUAL_TABLET | Freq: Every day | SUBLINGUAL | Status: DC
  Administered 2021-09-01 – 2021-09-03 (×3): 1 via SUBLINGUAL
  Filled 2021-09-01 (×3): qty 1

## 2021-09-01 MED ORDER — PROCHLORPERAZINE EDISYLATE 10 MG/2ML IJ SOLN
10.0000 mg | Freq: Four times a day (QID) | INTRAMUSCULAR | Status: DC | PRN
  Administered 2021-09-01 – 2021-09-03 (×8): 10 mg via INTRAVENOUS
  Filled 2021-09-01 (×8): qty 2

## 2021-09-01 MED ORDER — SUCRALFATE 1 G PO TABS
1.0000 g | ORAL_TABLET | Freq: Three times a day (TID) | ORAL | Status: DC
  Administered 2021-09-01 – 2021-09-03 (×5): 1 g via ORAL
  Filled 2021-09-01 (×5): qty 1

## 2021-09-01 NOTE — Progress Notes (Signed)
Patient complaining of abdominal pain, 7 out of 10 and complaining of nausea. NP Blount was notified.

## 2021-09-01 NOTE — Assessment & Plan Note (Addendum)
--   Stable.  Continue Suboxone.

## 2021-09-01 NOTE — Assessment & Plan Note (Addendum)
--   CT abdomen pelvis this admission unrevealing.  Continue Creon.

## 2021-09-01 NOTE — Assessment & Plan Note (Signed)
--   Denies recent alcohol use.  Monitor.
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
## 2021-09-01 NOTE — Progress Notes (Signed)
  Progress Note Tricia Brown   OHY:[id number]  DOB: 01-Aug-1976  DOA: 08/31/2021     0 Date of Service: 09/01/2021   Clinical Course 45 year old woman PMH pancreatitis including necrotizing pancreatitis with 55-month stay at [facility] earlier this year, cystogastrostomy 2021, presented with multiple days of abdominal pain and poor appetite with 5 pound weight loss.  Unable to keep down medications. --Initial evaluation revealed unremarkable lipase and CT imaging of the abdomen and pelvis.  However transaminases were markedly elevated and patient was admitted for further evaluation of suspected hepatitis.  Etiology unclear, no significant Tylenol use, no drugs other than what she is prescribed.  No new medications.  Assessment and Plan * Acute hepatitis -- With marked elevation of transaminases of unclear etiology.  Denies drug ingestion, Tylenol, alcohol use.  GI consultation pending, serology work-up pending.  Chronic pancreatitis (HCC) -- CT abdomen pelvis this admission unrevealing.  Resume Creon.  Opioid use disorder, severe, dependence (HCC) -- Stable.  Resume Suboxone.  Nausea and vomiting --currently resolved, tolerated broth this AM  Major depressive disorder, single episode -- Appears stable, resume Cymbalta.  Alcohol use disorder in remission -- Denies recent alcohol use.  Monitor.  Subjective:  Feels a little better today, still with epigastric and right upper quadrant pain but tolerated broth this morning.  Objective Vitals:   09/01/21 0037 09/01/21 0141 09/01/21 0500 09/01/21 0946  BP:  111/68 (!) 102/58 100/62  Pulse:  90 75 83  Resp:  18 16 18   Temp:  98.7 F (37.1 C) 97.8 F (36.6 C) 97.6 F (36.4 C)  TempSrc:  Oral Oral Oral  SpO2:  100% 99% 97%  Weight: 68 kg     Height: 5\' 7"  (1.702 m)      68 kg  Vital signs were reviewed and unremarkable.  Exam Physical Exam Vitals reviewed.  Constitutional:      General: She is not in acute distress.     Appearance: She is ill-appearing. She is not toxic-appearing.  Cardiovascular:     Rate and Rhythm: Normal rate and regular rhythm.     Heart sounds: No murmur heard. Pulmonary:     Effort: Pulmonary effort is normal. No respiratory distress.     Breath sounds: No wheezing, rhonchi or rales.  Abdominal:     General: Abdomen is flat. There is no distension.     Palpations: Abdomen is soft.     Tenderness: There is abdominal tenderness (epigastric >> RUQ).  Musculoskeletal:     Right lower leg: No edema.     Left lower leg: No edema.  Neurological:     Mental Status: She is alert.  Psychiatric:        Mood and Affect: Mood normal.        Behavior: Behavior normal.    Labs / Other Information Transaminases down but still quite elevated, total bilirubin without significant change 1.7.  Disposition Plan: Status is: Observation  Enoxaparin    Time spent: 35 minutes [facility] 09/01/2021, 11:14 AM

## 2021-09-01 NOTE — Hospital Course (Addendum)
45 year old woman PMH pancreatitis including necrotizing pancreatitis with 65-month stay at [facility] earlier this year, cystogastrostomy 2021, presented with multiple days of abdominal pain and poor appetite with 5 pound weight loss.  Unable to keep down medications. --Initial evaluation revealed unremarkable lipase and CT imaging of the abdomen and pelvis.  However transaminases were markedly elevated and patient was admitted for further evaluation of suspected hepatitis.  Etiology unclear, no significant Tylenol use, no drugs other than what she is prescribed.  No new medications. -- 12/13 improving, likely home tomorrow

## 2021-09-01 NOTE — Assessment & Plan Note (Addendum)
--   With marked elevation of transaminases of unclear etiology.  Hepatitis panel negative.  Denies drug ingestion, Tylenol, alcohol use.  GI consultation appreciated.  LFTs trending down.   -outpatient LFTS next week --Of note pharmacist reported there are some post marketing reports of duloxetine causing liver injury. States that "postmarketing cases of hepatotoxicity, including hepatitis, cholestatic hepatitis, cholestatic jaundice, acute hepatic necrosis, and fulminant hepatic failure/acute hepatic failure, have been reported rarely, including fatalities and cases occurring in patients without risk factors. The pattern of hepatic injury associated with duloxetine is often hepatocellular hepatitis, but cholestatic and mixed hepatocellular-cholestatic forms have also been described " -- Therefore duloxetine discontinued

## 2021-09-01 NOTE — Assessment & Plan Note (Addendum)
--   resolved, advanced diet

## 2021-09-01 NOTE — Assessment & Plan Note (Addendum)
--   Appears stable, stopped Cymbalta, see above.

## 2021-09-02 LAB — COMPREHENSIVE METABOLIC PANEL
ALT: 1239 U/L — ABNORMAL HIGH (ref 0–44)
AST: 392 U/L — ABNORMAL HIGH (ref 15–41)
Albumin: 3.2 g/dL — ABNORMAL LOW (ref 3.5–5.0)
Alkaline Phosphatase: 104 U/L (ref 38–126)
Anion gap: 8 (ref 5–15)
BUN: 6 mg/dL (ref 6–20)
CO2: 25 mmol/L (ref 22–32)
Calcium: 8.7 mg/dL — ABNORMAL LOW (ref 8.9–10.3)
Chloride: 108 mmol/L (ref 98–111)
Creatinine, Ser: 0.67 mg/dL (ref 0.44–1.00)
GFR, Estimated: >60 mL/min (ref >60)
Glucose, Bld: 89 mg/dL (ref 70–99)
Potassium: 3.8 mmol/L (ref 3.5–5.1)
Sodium: 141 mmol/L (ref 135–145)
Total Bilirubin: 1.2 mg/dL (ref 0.3–1.2)
Total Protein: 5.7 g/dL — ABNORMAL LOW (ref 6.5–8.1)

## 2021-09-02 LAB — EBV AB TO VIRAL CAPSID AG PNL, IGG+IGM
EBV VCA IgG: 294 U/mL — ABNORMAL HIGH (ref 0.0–17.9)
EBV VCA IgM: <36 U/mL (ref 0.0–35.9)

## 2021-09-02 LAB — CMV IGM: CMV IgM: <30 AU/mL (ref 0.0–29.9)

## 2021-09-02 MED ORDER — ADULT MULTIVITAMIN W/MINERALS CH
1.0000 | ORAL_TABLET | Freq: Every day | ORAL | Status: DC
  Administered 2021-09-02 – 2021-09-03 (×2): 1 via ORAL
  Filled 2021-09-02 (×2): qty 1

## 2021-09-02 MED ORDER — BOOST / RESOURCE BREEZE PO LIQD CUSTOM
1.0000 | Freq: Three times a day (TID) | ORAL | Status: DC
  Administered 2021-09-02 – 2021-09-03 (×3): 1 via ORAL

## 2021-09-02 NOTE — Progress Notes (Signed)
Initial Nutrition Assessment  INTERVENTION:   -Boost Breeze po TID, each supplement provides 250 kcal and 9 grams of protein  -Multivitamin with minerals daily  NUTRITION DIAGNOSIS:   Inadequate oral intake related to nausea, vomiting as evidenced by per patient/family report.  GOAL:   Patient will meet greater than or equal to 90% of their needs  MONITOR:   PO intake, Supplement acceptance, Labs, Weight trends, I & O's  REASON FOR ASSESSMENT:   Malnutrition Screening Tool    ASSESSMENT:   45 y.o. female medical history significant of EtOH and opiod abuse, multiple admits for pancreatitis, pancreatic pseudocyst, necrotizing pancreatitis with 2 month stay at [facility], cystogastrostomy 2021, presents for elevated LFTS.  Patient reports poor PO d/t N/V and not eating anything for 3 days as she couldn't even keep water down. Was on clears but diet was just advanced to fulls. Pt feels hungry today but still has nausea. States she always has nausea to some degree. Chronic diarrhea r/t pancreatitis. Does not like milky supplements, but likes [employer], will order.  Per weight records, overall pt's weight has increased since January 2022.   Medications: CREON, Carafate, Lactated ringers, Compazine  Labs reviewed.  NUTRITION - FOCUSED PHYSICAL EXAM:  Flowsheet Row Most Recent Value  Orbital Region No depletion  Upper Arm Region No depletion  Thoracic and Lumbar Region No depletion  Buccal Region No depletion  Temple Region No depletion  Clavicle Bone Region Mild depletion  Clavicle and Acromion Bone Region No depletion  Scapular Bone Region No depletion  Dorsal Hand No depletion  Patellar Region No depletion  Anterior Thigh Region No depletion  Posterior Calf Region No depletion  Edema (RD Assessment) None  Hair Reviewed  Eyes Reviewed  Mouth Reviewed  Skin Reviewed       Diet Order:   Diet Order             Diet full liquid Room service appropriate? Yes; Fluid  consistency: Thin  Diet effective now                   EDUCATION NEEDS:   Education needs have been addressed  Skin:  Skin Assessment: Reviewed RN Assessment  Last BM:  12/12  Height:   Ht Readings from Last 1 Encounters:  09/01/21 5\' 7"  (1.702 m)    Weight:   Wt Readings from Last 1 Encounters:  09/01/21 68 kg    BMI:  Body mass index is 23.49 kg/m.  Estimated Nutritional Needs:   Kcal:  2000-2200  Protein:  95-105g  Fluid:  2L/day  14/12/22, MS, RD, LDN Inpatient Clinical Dietitian Contact information available via Amion

## 2021-09-02 NOTE — TOC Transition Note (Signed)
Transition of Care [facility]) - CM/SW Discharge Note   Patient Details  Name: Tricia Brown MRN: [id number] Date of Birth: 06-Sep-1976  Transition of Care [facility]) CM/SW Contact:  Lennart Pall, LCSW Phone Number: 09/02/2021, 2:41 PM   Clinical Narrative:    Met with pt to complete readmission risk screening.  Pt confirms that she has PCP via [facility].  Has not difficulty obtaining her medications.  Lives with daughter and granddaughter and family very supportive/ no transportation concerns.  She is hopeful for discharge home tomorrow.  No TOC needs.     Barriers to Discharge: Continued Medical Work up   Patient Goals and CMS Choice Patient states their goals for this hospitalization and ongoing recovery are:: return home      Discharge Placement                       Discharge Plan and Services                DME Arranged: N/A DME Agency: NA                  Social Determinants of Health (SDOH) Interventions     Readmission Risk Interventions Readmission Risk Prevention Plan 09/02/2021  Transportation Screening Complete  Medication Review Press photographer) Complete  PCP or Specialist appointment within 3-5 days of discharge Complete  HRI or [place] Complete  SW Recovery Care/Counseling Consult Complete  [place] Not Applicable  Some recent data might be hidden

## 2021-09-02 NOTE — Progress Notes (Signed)
°  Progress Note    Tricia Brown   SHF:[id number]  DOB: 1976/09/05  DOA: 08/31/2021     1 Date of Service: 09/02/2021   Clinical Course 45 year old woman PMH pancreatitis including necrotizing pancreatitis with 89-month stay at [facility] earlier this year, cystogastrostomy 2021, presented with multiple days of abdominal pain and poor appetite with 5 pound weight loss.  Unable to keep down medications. --Initial evaluation revealed unremarkable lipase and CT imaging of the abdomen and pelvis.  However transaminases were markedly elevated and patient was admitted for further evaluation of suspected hepatitis.  Etiology unclear, no significant Tylenol use, no drugs other than what she is prescribed.  No new medications. -- 12/13 improving, likely home tomorrow  Assessment and Plan * Acute hepatitis -- With marked elevation of transaminases of unclear etiology.  Hepatitis panel negative.  Denies drug ingestion, Tylenol, alcohol use.  GI consultation appreciated.  LFTs trending down.  Hopefully home tomorrow if continues to improve. --Of note pharmacist reported there are some post marketing reports of duloxetine causing liver injury. States that "postmarketing cases of hepatotoxicity, including hepatitis, cholestatic hepatitis, cholestatic jaundice, acute hepatic necrosis, and fulminant hepatic failure/acute hepatic failure, have been reported rarely, including fatalities and cases occurring in patients without risk factors. The pattern of hepatic injury associated with duloxetine is often hepatocellular hepatitis, but cholestatic and mixed hepatocellular-cholestatic forms have also been described " -- Therefore duloxetine discontinued  Chronic pancreatitis (HCC) -- CT abdomen pelvis this admission unrevealing.  Continue Creon.  Opioid use disorder, severe, dependence (HCC) -- Stable.  Continue Suboxone.  Nausea and vomiting -- Still some nausea but no vomiting.  Diet advanced to full  liquids.  Major depressive disorder, single episode -- Appears stable, stopped Cymbalta, see above.    Alcohol use disorder in remission -- Denies recent alcohol use.  Monitor.    Subjective:  Feels better, still nauseous but no vomiting  Objective Vitals:   09/01/21 0946 09/01/21 1353 09/01/21 2050 09/02/21 0439  BP: 100/62 (!) 92/58 (!) 101/59 101/65  Pulse: 83 80 74 71  Resp: 18 18 17 17   Temp: 97.6 F (36.4 C) 97.8 F (36.6 C) 98.4 F (36.9 C) (!) 97.5 F (36.4 C)  TempSrc: Oral Oral Oral   SpO2: 97% 97% 99% 100%  Weight:      Height:       68 kg  Vital signs were reviewed and unremarkable.  Exam Physical Exam Vitals reviewed.  Constitutional:      General: She is not in acute distress.    Appearance: She is not ill-appearing or toxic-appearing.  Cardiovascular:     Rate and Rhythm: Normal rate and regular rhythm.  Pulmonary:     Effort: Pulmonary effort is normal. No respiratory distress.     Breath sounds: No wheezing, rhonchi or rales.  Psychiatric:        Mood and Affect: Mood normal.        Behavior: Behavior normal.    Labs / Other Information My review of labs, imaging, notes and other tests is significant for     AST markedly improved, ALT trending down, total bilirubin has normalized  Disposition Plan: Status is: Inpatient  Remains inpatient appropriate because: acute hepatis  enoxaparin   Time spent: 20 minutes [facility] 09/02/2021, 11:21 AM

## 2021-09-02 NOTE — Plan of Care (Signed)
  Problem: Coping: Goal: Level of anxiety will decrease Outcome: Progressing   Problem: Pain Managment: Goal: General experience of comfort will improve Outcome: Progressing   

## 2021-09-02 NOTE — Progress Notes (Signed)
[facility] Progress Note ° °Tricia Brown 45 y.o. 09/22/1975 ° °CC:  Elevated LFTs ° ° °Subjective: °Patient states she still has abdominal pain. Denies nausea/vomiting. Denies melena/hematochezia. Tolerating diet well. ° °ROS : Review of Systems  °Gastrointestinal:  Positive for abdominal pain. Negative for blood in stool, constipation, diarrhea, heartburn, melena, nausea and vomiting.  °Genitourinary:  Negative for dysuria and urgency.   ° ° °Objective: °Vital signs in last 24 hours: °Vitals:  ° 09/01/21 2050 09/02/21 0439  °BP: (!) 101/59 101/65  °Pulse: 74 71  °Resp: 17 17  °Temp: 98.4 °F (36.9 °C) (!) 97.5 °F (36.4 °C)  °SpO2: 99% 100%  ° ° °Physical Exam: ° °General:  Alert, cooperative, no distress, appears stated age  °Head:  Normocephalic, without obvious abnormality, atraumatic  °Eyes:  Anicteric sclera, EOM's intact  °Lungs:   Clear to auscultation bilaterally, respirations unlabored  °Heart:  Regular rate and rhythm, S1, S2 normal  °Abdomen:   Soft, diffuse mild tenderness, bowel sounds active all four quadrants,  no masses,   ° ° °Lab Results: °Recent Labs  °  09/01/21 °0418 09/02/21 °0428  °NA 135 141  °K 3.8 3.8  °CL 104 108  °CO2 20* 25  °GLUCOSE 78 89  °BUN 17 6  °CREATININE 0.66 0.67  °CALCIUM 8.6* 8.7*  ° °Recent Labs  °  09/01/21 °0418 09/02/21 °0428  °AST 1,272* 392*  °ALT 1,995* 1,239*  °ALKPHOS 114 104  °BILITOT 1.7* 1.2  °PROT 6.1* 5.7*  °ALBUMIN 3.5 3.2*  ° °Recent Labs  °  08/31/21 °1831 09/01/21 °0418  °WBC 11.8* 8.7  °NEUTROABS 10.0*  --   °HGB 16.2* 13.6  °HCT 47.7* 39.3  °MCV 86.7 86.4  °PLT 211 185  ° °Recent Labs  °  08/31/21 °1931  °LABPROT 14.7  °INR 1.2  ° ° ° ° °Assessment °Elevated LFTs °-Hgb 13.6 °- lipase normal °-AST 392/ALT 1239/alk phos 104/T bili 1.2 (trending down) °- Normal renal function °-CT abdomen pelvis with contrast 12/11: unrevealing, no pancreatic ductal dilation, no intrahepatic biliary dilation °- CMV/EBV pending °- Hepatitis panel  negative ° ° °Plan: °Acute elevation in Lfts was likely due to shock liver from hypotension vs medication induced. LFTs are improving. °Continue to trend LFTs to normalization °Continue supportive care °[facility] will follow. ° ° M  PA-C °09/02/2021, 10:08 AM ° °Contact #  [phone]  °

## 2021-09-03 LAB — COMPREHENSIVE METABOLIC PANEL
ALT: 818 U/L — ABNORMAL HIGH (ref 0–44)
AST: 157 U/L — ABNORMAL HIGH (ref 15–41)
Albumin: 3 g/dL — ABNORMAL LOW (ref 3.5–5.0)
Alkaline Phosphatase: 105 U/L (ref 38–126)
Anion gap: 6 (ref 5–15)
BUN: <5 mg/dL — ABNORMAL LOW (ref 6–20)
CO2: 25 mmol/L (ref 22–32)
Calcium: 8.7 mg/dL — ABNORMAL LOW (ref 8.9–10.3)
Chloride: 111 mmol/L (ref 98–111)
Creatinine, Ser: 0.62 mg/dL (ref 0.44–1.00)
GFR, Estimated: >60 mL/min (ref >60)
Glucose, Bld: 97 mg/dL (ref 70–99)
Potassium: 4.1 mmol/L (ref 3.5–5.1)
Sodium: 142 mmol/L (ref 135–145)
Total Bilirubin: 0.6 mg/dL (ref 0.3–1.2)
Total Protein: 5.3 g/dL — ABNORMAL LOW (ref 6.5–8.1)

## 2021-09-03 MED ORDER — ADULT MULTIVITAMIN W/MINERALS CH: 1.0000 | ORAL_TABLET | Freq: Every day | ORAL | Status: AC

## 2021-09-03 MED ORDER — SUCRALFATE 1 G PO TABS: 1.0000 g | ORAL_TABLET | Freq: Three times a day (TID) | ORAL | Status: DC

## 2021-09-03 MED ORDER — MORPHINE SULFATE (PF) 2 MG/ML IV SOLN
1.0000 mg | Freq: Once | INTRAVENOUS | Status: AC
  Administered 2021-09-03: 01:00:00 1 mg via INTRAVENOUS
  Filled 2021-09-03: qty 1

## 2021-09-03 NOTE — Discharge Instructions (Signed)
Cymbalta held as can cause/worsen liver enzymes

## 2021-09-03 NOTE — Progress Notes (Addendum)
[facility] Progress Note  Tricia Brown 45 y.o. Sep 10, 1976  CC:  Elevated LFTs   Subjective: Patient states she is doing well. Tolerating diet. Still hs mild diffuse abdominal tenderness. Denise nausea/vomiting. Denies melena/hematochezia  ROS : Review of Systems  Gastrointestinal:  Positive for abdominal pain. Negative for blood in stool, constipation, diarrhea, heartburn, melena, nausea and vomiting.  Genitourinary:  Negative for dysuria and urgency.     Objective: Vital signs in last 24 hours: Vitals:   09/02/21 2131 09/03/21 0523  BP: 103/65 (!) 111/50  Pulse: 99 73  Resp: 18 16  Temp: 97.7 F (36.5 C) 97.6 F (36.4 C)  SpO2: 100% 100%    Physical Exam:  General:  Alert, cooperative, no distress, appears stated age  Head:  Normocephalic, without obvious abnormality, atraumatic  Eyes:  Anicteric sclera, EOM's intact  Lungs:   Clear to auscultation bilaterally, respirations unlabored  Heart:  Regular rate and rhythm, S1, S2 normal  Abdomen:   Soft, diffuse mild tenderness, bowel sounds active all four quadrants,  no masses,     Lab Results: Recent Labs    09/02/21 0428 09/03/21 0426  NA 141 142  K 3.8 4.1  CL 108 111  CO2 25 25  GLUCOSE 89 97  BUN 6 <5*  CREATININE 0.67 0.62  CALCIUM 8.7* 8.7*   Recent Labs    09/02/21 0428 09/03/21 0426  AST 392* 157*  ALT 1,239* 818*  ALKPHOS 104 105  BILITOT 1.2 0.6  PROT 5.7* 5.3*  ALBUMIN 3.2* 3.0*   Recent Labs    08/31/21 1831 09/01/21 0418  WBC 11.8* 8.7  NEUTROABS 10.0*  --   HGB 16.2* 13.6  HCT 47.7* 39.3  MCV 86.7 86.4  PLT 211 185   Recent Labs    08/31/21 1931  LABPROT 14.7  INR 1.2      Assessment Elevated LFTs -Hgb stable - lipase normal -AST 157/ALT 818/alk phos 104/T bili 0.6 (trending down) - Normal renal function -CT abdomen pelvis with contrast 12/11: unrevealing, no pancreatic ductal dilation, no intrahepatic biliary dilation - CMV/EBV pending - Hepatitis  panel negative   Plan: LFTs are continuing to trend downward. Continue to follow LFTs to normalization as an outpatient. From GI standpoint, patient can be discharged today. Likely multifactorial etiology of elevated LFTs including medication vs hypotensive episode [facility] will sign off. Please contact us if we can be of any further assistance during this hospital stay.  Patient discussed with PA and agree with [facility] PA-C 09/03/2021, 9:38 AM  Contact #  [phone]

## 2021-09-03 NOTE — Discharge Summary (Signed)
Physician Discharge Summary   Patient name: Tricia Brown  Admit date:     08/31/2021  Discharge date: 09/03/2021  Discharge Physician: Joseph Art   PCP: [facility]   Recommendations at discharge: CMP 1 week  Discharge Diagnoses Principal Problem:   Acute hepatitis Active Problems:   Chronic pancreatitis (HCC)   Opioid use disorder, severe, dependence (HCC)   Alcohol use disorder in remission   Major depressive disorder, single episode   Nausea and vomiting     Hospital Course   45 year old woman PMH pancreatitis including necrotizing pancreatitis with 86-month stay at [facility] earlier this year, cystogastrostomy 2021, presented with multiple days of abdominal pain and poor appetite with 5 pound weight loss.  Unable to keep down medications. --Initial evaluation revealed unremarkable lipase and CT imaging of the abdomen and pelvis.  However transaminases were markedly elevated and patient was admitted for further evaluation of suspected hepatitis.  Etiology unclear, no significant Tylenol use, no drugs other than what she is prescribed.  No new medications. -- 12/13 improving, likely home tomorrow    * Acute hepatitis -- With marked elevation of transaminases of unclear etiology.  Hepatitis panel negative.  Denies drug ingestion, Tylenol, alcohol use.  GI consultation appreciated.  LFTs trending down.   -outpatient LFTS next week --Of note pharmacist reported there are some post marketing reports of duloxetine causing liver injury. States that "postmarketing cases of hepatotoxicity, including hepatitis, cholestatic hepatitis, cholestatic jaundice, acute hepatic necrosis, and fulminant hepatic failure/acute hepatic failure, have been reported rarely, including fatalities and cases occurring in patients without risk factors. The pattern of hepatic injury associated with duloxetine is often hepatocellular hepatitis, but cholestatic and mixed hepatocellular-cholestatic  forms have also been described " -- Therefore duloxetine discontinued  Chronic pancreatitis (HCC) -- CT abdomen pelvis this admission unrevealing.  Continue Creon.  Nausea and vomiting -- resolved, advanced diet  Major depressive disorder, single episode -- Appears stable, stopped Cymbalta, see above.    Alcohol use disorder in remission -- Denies recent alcohol use.  Monitor.  Opioid use disorder, severe, dependence (HCC) -- Stable.  Continue Suboxone.       Condition at discharge: good  Exam In bed, NAD  Disposition: Home  Discharge time: greater than 30 minutes.  Follow-up Information     [facility] Follow up in 1 week(s).   Contact information: [address] [place] [address] [phone]                 Allergies as of 09/03/2021   No Known Allergies      Medication List     STOP taking these medications    DULoxetine 30 MG capsule Commonly known as: CYMBALTA       TAKE these medications    budesonide-formoterol 80-4.5 MCG/ACT inhaler Commonly known as: SYMBICORT Inhale 2 puffs into the lungs in the morning and at bedtime.   Emgality 120 MG/ML Soaj Generic drug: Galcanezumab-gnlm Inject 120 mLs into the skin every 30 (thirty) days.   ferrous sulfate 324 MG Tbec Take 324 mg by mouth daily with breakfast.   fluorometholone 0.1 % ophthalmic suspension Commonly known as: FML Place 1 drop into both eyes 2 (two) times daily.   gabapentin 300 MG capsule Commonly known as: NEURONTIN Take 2 capsules (600 mg total) by mouth 3 (three) times daily.   lipase/protease/amylase 35456 UNITS Cpep capsule Commonly known as: CREON Take 36,000 Units by mouth 3 (three) times daily with meals.   multivitamin  with minerals Tabs tablet Take 1 tablet by mouth daily. Start taking on: September 04, 2021   ondansetron 4 MG tablet Commonly known as: ZOFRAN Take 1 tablet (4 mg total) by mouth every 8 (eight) hours as needed  for nausea or vomiting.   pantoprazole 40 MG tablet Commonly known as: Protonix Take 1 tablet (40 mg total) by mouth daily.   ProAir HFA 108 (90 Base) MCG/ACT inhaler Generic drug: albuterol Inhale 2 puffs into the lungs every 6 (six) hours as needed for wheezing or shortness of breath.   promethazine 12.5 MG tablet Commonly known as: PHENERGAN Take 1 tablet (12.5 mg total) by mouth every 6 (six) hours as needed for nausea or vomiting.   Restasis 0.05 % ophthalmic emulsion Generic drug: cycloSPORINE Place 1 drop into both eyes 2 (two) times daily.   rizatriptan 10 MG disintegrating tablet Commonly known as: MAXALT-MLT Take 1 tablet (10 mg total) by mouth as needed for migraine. May repeat in 2 hours if needed What changed: when to take this   Suboxone 8-2 MG Film Generic drug: Buprenorphine HCl-Naloxone HCl Place 1 Film under the tongue 3 (three) times daily.   sucralfate 1 g tablet Commonly known as: CARAFATE Take 1 tablet (1 g total) by mouth 3 (three) times daily.   valACYclovir 500 MG tablet Commonly known as: VALTREX Take 500 mg by mouth daily as needed (as directed, for outbreaks).        CT ABDOMEN PELVIS W CONTRAST  Result Date: 08/31/2021 CLINICAL DATA:  Abdominal pain. EXAM: CT ABDOMEN AND PELVIS WITH CONTRAST TECHNIQUE: Multidetector CT imaging of the abdomen and pelvis was performed using the standard protocol following bolus administration of intravenous contrast. CONTRAST:  76mL OMNIPAQUE IOHEXOL 350 MG/ML SOLN COMPARISON:  CT abdomen pelvis dated 08/17/2021. FINDINGS: Lower chest: Minimal bibasilar dependent atelectasis. The visualized lung bases are otherwise clear. No intra-abdominal free air or free fluid. Hepatobiliary: The liver is unremarkable. No intrahepatic biliary dilatation. Cholecystectomy. Pancreas: Unremarkable. No pancreatic ductal dilatation or surrounding inflammatory changes. Spleen: Normal in size without focal abnormality. Adrenals/Urinary  Tract: The adrenal glands are unremarkable. The kidneys, visualized ureters, and urinary bladder appear unremarkable. Stomach/Bowel: There is no bowel obstruction or active inflammation. The appendix is normal. Vascular/Lymphatic: The abdominal aorta and IVC unremarkable. No portal venous gas. There is no adenopathy. Reproductive: Hysterectomy.  No adnexal masses. Other: None Musculoskeletal: No acute or significant osseous findings. IMPRESSION: No acute intra-abdominal or pelvic pathology. Electronically Signed   By: Elgie Collard M.D.   On: 08/31/2021 20:23   CT ABDOMEN PELVIS W CONTRAST  Result Date: 08/17/2021 CLINICAL DATA:  Initial evaluation for acute upper abdominal pain. EXAM: CT ABDOMEN AND PELVIS WITH CONTRAST TECHNIQUE: Multidetector CT imaging of the abdomen and pelvis was performed using the standard protocol following bolus administration of intravenous contrast. CONTRAST:  65mL OMNIPAQUE IOHEXOL 350 MG/ML SOLN COMPARISON:  Prior CT from 06/20/2021. FINDINGS: Lower chest: Mild subsegmental atelectasis present at the lung bases. Visualized lungs are otherwise clear. Hepatobiliary: No focal intrahepatic lesions. Prior cholecystectomy. No biliary dilatation. Pancreas: Unremarkable. No pancreatic ductal dilatation or surrounding inflammatory changes. Spleen: Normal in size without focal abnormality. Adrenals/Urinary Tract: Adrenal glands within normal limits. Kidneys equal size with symmetric enhancement. No nephrolithiasis, hydronephrosis or focal enhancing renal mass. No visible hydroureter. Partially distended bladder within normal limits. Stomach/Bowel: Stomach within normal limits. No evidence for bowel obstruction. Normal appendix. No acute inflammatory changes seen about the bowels. Vascular/Lymphatic: Normal intravascular enhancement seen throughout the intra-abdominal  aorta. Mesenteric vessels patent proximally. No adenopathy. Reproductive: Uterus is absent.  No adnexal mass. Other: No  free air or fluid. Musculoskeletal: No acute osseous finding. No discrete or worrisome osseous lesions. IMPRESSION: 1. No CT evidence for acute intra-abdominal or pelvic process. 2. Prior cholecystectomy and hysterectomy. Electronically Signed   By: Rise Mu M.D.   On: 08/17/2021 02:04   Results for orders placed or performed during the hospital encounter of 08/31/21  Resp Panel by RT-PCR (Flu A&B, Covid) Nasopharyngeal Swab     Status: None   Collection Time: 08/31/21  7:31 PM   Specimen: Nasopharyngeal Swab; Nasopharyngeal(NP) swabs in vial transport medium  Result Value Ref Range Status   SARS Coronavirus 2 by RT PCR NEGATIVE NEGATIVE Final    Comment: (NOTE) SARS-CoV-2 target nucleic acids are NOT DETECTED.  The SARS-CoV-2 RNA is generally detectable in upper respiratory specimens during the acute phase of infection. The lowest concentration of SARS-CoV-2 viral copies this assay can detect is 138 copies/mL. A negative result does not preclude SARS-Cov-2 infection and should not be used as the sole basis for treatment or other patient management decisions. A negative result may occur with  improper specimen collection/handling, submission of specimen other than nasopharyngeal swab, presence of viral mutation(s) within the areas targeted by this assay, and inadequate number of viral copies(<138 copies/mL). A negative result must be combined with clinical observations, patient history, and epidemiological information. The expected result is Negative.  Fact Sheet for Patients:  BloggerCourse.com  Fact Sheet for Healthcare Providers:  SeriousBroker.it  This test is no t yet approved or cleared by the [place] FDA and  has been authorized for detection and/or diagnosis of SARS-CoV-2 by FDA under an Emergency Use Authorization (EUA). This EUA will remain  in effect (meaning this test can be used) for the duration of  the COVID-19 declaration under Section 564(b)(1) of the Act, 21 U.S.C.section 360bbb-3(b)(1), unless the authorization is terminated  or revoked sooner.       Influenza A by PCR NEGATIVE NEGATIVE Final   Influenza B by PCR NEGATIVE NEGATIVE Final    Comment: (NOTE) The Xpert Xpress SARS-CoV-2/FLU/RSV plus assay is intended as an aid in the diagnosis of influenza from Nasopharyngeal swab specimens and should not be used as a sole basis for treatment. Nasal washings and aspirates are unacceptable for Xpert Xpress SARS-CoV-2/FLU/RSV testing.  Fact Sheet for Patients: BloggerCourse.com  Fact Sheet for Healthcare Providers: SeriousBroker.it  This test is not yet approved or cleared by the [place] FDA and has been authorized for detection and/or diagnosis of SARS-CoV-2 by FDA under an Emergency Use Authorization (EUA). This EUA will remain in effect (meaning this test can be used) for the duration of the COVID-19 declaration under Section 564(b)(1) of the Act, 21 U.S.C. section 360bbb-3(b)(1), unless the authorization is terminated or revoked.  Performed at [facility], [address], [place], [place] [address]   Culture, blood (routine x 2)     Status: None (Preliminary result)   Collection Time: 08/31/21  7:49 PM   Specimen: BLOOD  Result Value Ref Range Status   Specimen Description   Final    BLOOD RIGHT ANTECUBITAL Performed at [facility], [address] [place], [place] [address]    Special Requests   Final    BOTTLES DRAWN AEROBIC AND ANAEROBIC Blood Culture adequate volume Performed at [facility], [address], [place], [place] [address]    Culture  Final    NO GROWTH 2 DAYS Performed at [facility], [address], [place], [place] [address]    Report Status PENDING  Incomplete  Urine Culture     Status: Abnormal   Collection Time:  08/31/21  9:01 PM   Specimen: Urine, Clean Catch  Result Value Ref Range Status   Specimen Description   Final    URINE, CLEAN CATCH Performed at [facility], [address], [place], [place] [address]    Special Requests   Final    NONE Performed at [facility], [address], [place], [place] [address]    Culture (A)  Final    <10,000 COLONIES/mL >=100,000 COLONIES/mL Performed at [facility], [address] [place], [place] [address]    Report Status 09/01/2021 FINAL  Final  Culture, blood (routine x 2)     Status: None (Preliminary result)   Collection Time: 08/31/21  9:41 PM   Specimen: BLOOD  Result Value Ref Range Status   Specimen Description   Final    BLOOD RIGHT ARM Performed at [facility], [address], [place], [place] [address]    Special Requests   Final    BOTTLES DRAWN AEROBIC AND ANAEROBIC Blood Culture results may not be optimal due to an excessive volume of blood received in culture bottles Performed at [facility], [address], [place], [place] [address]    Culture   Final    NO GROWTH 2 DAYS Performed at [facility], [address], [place], [place] [address]    Report Status PENDING  Incomplete    Signed:  Joseph Art DO [facility] 09/03/2021, 4:33 PM

## 2021-09-03 NOTE — Progress Notes (Signed)
Patient discharged home via wheelchair.  

## 2021-09-03 NOTE — Progress Notes (Signed)
Discharge teaching complete. Meds, diet, activity, follow up appointments reviewed and all questions answered. Copy of instructions given to patient.  

## 2021-09-05 ENCOUNTER — Emergency Department (HOSPITAL_BASED_OUTPATIENT_CLINIC_OR_DEPARTMENT_OTHER)
Admission: EM | Admit: 2021-09-05 | Discharge: 2021-09-05 | Disposition: A | Discharge status: Home / Self Care | Payer: Medicaid Other | Source: Home / Self Care | Attending: Emergency Medicine | Admitting: Emergency Medicine

## 2021-09-05 ENCOUNTER — Encounter (HOSPITAL_COMMUNITY): Payer: Self-pay

## 2021-09-05 ENCOUNTER — Other Ambulatory Visit: Payer: Self-pay

## 2021-09-05 ENCOUNTER — Emergency Department (HOSPITAL_COMMUNITY)
Admission: EM | Admit: 2021-09-05 | Discharge: 2021-09-05 | Disposition: A | Discharge status: Home / Self Care | Payer: Medicaid Other | Attending: Emergency Medicine | Admitting: Emergency Medicine

## 2021-09-05 DIAGNOSIS — I82611 Acute embolism and thrombosis of superficial veins of right upper extremity: Secondary | ICD-10-CM | POA: Diagnosis not present

## 2021-09-05 DIAGNOSIS — I808 Phlebitis and thrombophlebitis of other sites: Secondary | ICD-10-CM

## 2021-09-05 DIAGNOSIS — M79601 Pain in right arm: Secondary | ICD-10-CM | POA: Diagnosis present

## 2021-09-05 DIAGNOSIS — Z87891 Personal history of nicotine dependence: Secondary | ICD-10-CM | POA: Insufficient documentation

## 2021-09-05 NOTE — ED Triage Notes (Signed)
Patient reports that she had an IV removed from the RAC 2 days ago. Patient has slight redness and a hard area above the site. Patient states she is having discomfort when she moves her right arm. 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

## 2021-09-05 NOTE — Progress Notes (Signed)
Right upper extremity venous duplex has been completed. Preliminary results can be found in CV Proc through chart review.  Results were given to Dr. Charm Barges.  09/05/21 1:08 PM Olen Cordial RVT
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
## 2021-09-05 NOTE — Discharge Instructions (Signed)
You were seen in the emergency department for evaluation of right arm pain and swelling redness.  Your ultrasound showed a superficial thrombophlebitis.  You should take an aspirin a day and do warm compresses to the area.  Follow-up with your regular doctor.  Return if any worsening or concerning symptoms.

## 2021-09-06 LAB — CULTURE, BLOOD (ROUTINE X 2)
Culture: NO GROWTH
Culture: NO GROWTH
Special Requests: ADEQUATE

## 2021-09-11 ENCOUNTER — Other Ambulatory Visit: Payer: Self-pay

## 2021-09-11 ENCOUNTER — Emergency Department (HOSPITAL_COMMUNITY): Payer: Medicaid Other

## 2021-09-11 ENCOUNTER — Observation Stay (HOSPITAL_COMMUNITY): Payer: Medicaid Other

## 2021-09-11 ENCOUNTER — Encounter (HOSPITAL_COMMUNITY): Payer: Self-pay | Admitting: Emergency Medicine

## 2021-09-11 ENCOUNTER — Inpatient Hospital Stay (HOSPITAL_COMMUNITY)
Admission: EM | Admit: 2021-09-11 | Discharge: 2021-09-17 | DRG: 640 | Disposition: A | Discharge status: Home / Self Care | Payer: Medicaid Other | Attending: Internal Medicine | Admitting: Internal Medicine

## 2021-09-11 DIAGNOSIS — F1091 Alcohol use, unspecified, in remission: Secondary | ICD-10-CM | POA: Diagnosis present

## 2021-09-11 DIAGNOSIS — K861 Other chronic pancreatitis: Secondary | ICD-10-CM | POA: Diagnosis not present

## 2021-09-11 DIAGNOSIS — Z20822 Contact with and (suspected) exposure to covid-19: Secondary | ICD-10-CM | POA: Diagnosis present

## 2021-09-11 DIAGNOSIS — F1120 Opioid dependence, uncomplicated: Secondary | ICD-10-CM | POA: Diagnosis present

## 2021-09-11 DIAGNOSIS — R791 Abnormal coagulation profile: Secondary | ICD-10-CM | POA: Diagnosis not present

## 2021-09-11 DIAGNOSIS — R410 Disorientation, unspecified: Secondary | ICD-10-CM | POA: Diagnosis present

## 2021-09-11 DIAGNOSIS — G9340 Encephalopathy, unspecified: Secondary | ICD-10-CM

## 2021-09-11 DIAGNOSIS — K219 Gastro-esophageal reflux disease without esophagitis: Secondary | ICD-10-CM | POA: Diagnosis present

## 2021-09-11 DIAGNOSIS — Z833 Family history of diabetes mellitus: Secondary | ICD-10-CM

## 2021-09-11 DIAGNOSIS — R4182 Altered mental status, unspecified: Secondary | ICD-10-CM | POA: Diagnosis not present

## 2021-09-11 DIAGNOSIS — Z79899 Other long term (current) drug therapy: Secondary | ICD-10-CM

## 2021-09-11 DIAGNOSIS — F1011 Alcohol abuse, in remission: Secondary | ICD-10-CM | POA: Diagnosis present

## 2021-09-11 DIAGNOSIS — Z87891 Personal history of nicotine dependence: Secondary | ICD-10-CM

## 2021-09-11 DIAGNOSIS — E162 Hypoglycemia, unspecified: Principal | ICD-10-CM

## 2021-09-11 DIAGNOSIS — G8929 Other chronic pain: Secondary | ICD-10-CM | POA: Diagnosis present

## 2021-09-11 DIAGNOSIS — G9341 Metabolic encephalopathy: Secondary | ICD-10-CM | POA: Diagnosis present

## 2021-09-11 DIAGNOSIS — Z7951 Long term (current) use of inhaled steroids: Secondary | ICD-10-CM

## 2021-09-11 DIAGNOSIS — F32A Depression, unspecified: Secondary | ICD-10-CM | POA: Diagnosis present

## 2021-09-11 DIAGNOSIS — G43909 Migraine, unspecified, not intractable, without status migrainosus: Secondary | ICD-10-CM

## 2021-09-11 LAB — CBC WITH DIFFERENTIAL/PLATELET
Abs Immature Granulocytes: 0.03 10*3/uL (ref 0.00–0.07)
Basophils Absolute: 0.1 10*3/uL (ref 0.0–0.1)
Basophils Relative: 1 %
Eosinophils Absolute: 0.1 10*3/uL (ref 0.0–0.5)
Eosinophils Relative: 2 %
HCT: 38.5 % (ref 36.0–46.0)
Hemoglobin: 12.2 g/dL (ref 12.0–15.0)
Immature Granulocytes: 0 %
Lymphocytes Relative: 13 %
Lymphs Abs: 1 10*3/uL (ref 0.7–4.0)
MCH: 30.2 pg (ref 26.0–34.0)
MCHC: 31.7 g/dL (ref 30.0–36.0)
MCV: 95.3 fL (ref 80.0–100.0)
Monocytes Absolute: 0.6 10*3/uL (ref 0.1–1.0)
Monocytes Relative: 7 %
Neutro Abs: 6.3 10*3/uL (ref 1.7–7.7)
Neutrophils Relative %: 77 %
Platelets: 237 10*3/uL (ref 150–400)
RBC: 4.04 MIL/uL (ref 3.87–5.11)
RDW: 14.5 % (ref 11.5–15.5)
WBC: 8.1 10*3/uL (ref 4.0–10.5)
nRBC: 0 % (ref 0.0–0.2)

## 2021-09-11 LAB — COMPREHENSIVE METABOLIC PANEL
ALT: 70 U/L — ABNORMAL HIGH (ref 0–44)
AST: 21 U/L (ref 15–41)
Albumin: 3.4 g/dL — ABNORMAL LOW (ref 3.5–5.0)
Alkaline Phosphatase: 107 U/L (ref 38–126)
Anion gap: 9 (ref 5–15)
BUN: 10 mg/dL (ref 6–20)
CO2: 22 mmol/L (ref 22–32)
Calcium: 8.1 mg/dL — ABNORMAL LOW (ref 8.9–10.3)
Chloride: 111 mmol/L (ref 98–111)
Creatinine, Ser: 0.7 mg/dL (ref 0.44–1.00)
GFR, Estimated: >60 mL/min (ref >60)
Glucose, Bld: 80 mg/dL (ref 70–99)
Potassium: 3 mmol/L — ABNORMAL LOW (ref 3.5–5.1)
Sodium: 142 mmol/L (ref 135–145)
Total Bilirubin: 0.6 mg/dL (ref 0.3–1.2)
Total Protein: 6 g/dL — ABNORMAL LOW (ref 6.5–8.1)

## 2021-09-11 LAB — URINALYSIS, ROUTINE W REFLEX MICROSCOPIC
Bilirubin Urine: NEGATIVE
Glucose, UA: 100 mg/dL — AB
Hgb urine dipstick: NEGATIVE
Ketones, ur: NEGATIVE mg/dL
Leukocytes,Ua: NEGATIVE
Nitrite: NEGATIVE
Protein, ur: NEGATIVE mg/dL
Specific Gravity, Urine: 1.01 (ref 1.005–1.030)
pH: 7 (ref 5.0–8.0)

## 2021-09-11 LAB — RAPID URINE DRUG SCREEN, HOSP PERFORMED
Amphetamines: NOT DETECTED
Barbiturates: NOT DETECTED
Benzodiazepines: NOT DETECTED
Cocaine: NOT DETECTED
Opiates: NOT DETECTED
Tetrahydrocannabinol: POSITIVE — AB

## 2021-09-11 LAB — PROTIME-INR
INR: 1.4 — ABNORMAL HIGH (ref 0.8–1.2)
Prothrombin Time: 17.1 seconds — ABNORMAL HIGH (ref 11.4–15.2)

## 2021-09-11 LAB — RESP PANEL BY RT-PCR (FLU A&B, COVID) ARPGX2
Influenza A by PCR: NEGATIVE
Influenza B by PCR: NEGATIVE
SARS Coronavirus 2 by RT PCR: NEGATIVE

## 2021-09-11 LAB — CBG MONITORING, ED
Glucose-Capillary: 20 mg/dL — CL (ref 70–99)
Glucose-Capillary: 143 mg/dL — ABNORMAL HIGH (ref 70–99)
Glucose-Capillary: 159 mg/dL — ABNORMAL HIGH (ref 70–99)
Glucose-Capillary: 94 mg/dL (ref 70–99)
Glucose-Capillary: 66 mg/dL — ABNORMAL LOW (ref 70–99)
Glucose-Capillary: 130 mg/dL — ABNORMAL HIGH (ref 70–99)
Glucose-Capillary: 93 mg/dL (ref 70–99)
Glucose-Capillary: 92 mg/dL (ref 70–99)

## 2021-09-11 LAB — LIPASE, BLOOD: Lipase: 21 U/L (ref 11–51)

## 2021-09-11 LAB — AMMONIA: Ammonia: 22 umol/L (ref 9–35)

## 2021-09-11 LAB — ETHANOL: Alcohol, Ethyl (B): <10 mg/dL (ref <10)

## 2021-09-11 LAB — GLUCOSE, CAPILLARY
Glucose-Capillary: 72 mg/dL (ref 70–99)
Glucose-Capillary: 85 mg/dL (ref 70–99)

## 2021-09-11 LAB — MRSA NEXT GEN BY PCR, NASAL: MRSA by PCR Next Gen: NOT DETECTED

## 2021-09-11 MED ORDER — THIAMINE HCL 100 MG/ML IJ SOLN
500.0000 mg | INTRAVENOUS | Status: DC
  Administered 2021-09-11: 21:00:00 500 mg via INTRAVENOUS
  Filled 2021-09-11 (×2): qty 5

## 2021-09-11 MED ORDER — NICOTINE 14 MG/24HR TD PT24
14.0000 mg | MEDICATED_PATCH | Freq: Every day | TRANSDERMAL | Status: DC
  Administered 2021-09-11 – 2021-09-16 (×5): 14 mg via TRANSDERMAL
  Filled 2021-09-11 (×6): qty 1

## 2021-09-11 MED ORDER — THIAMINE HCL 100 MG PO TABS
100.0000 mg | ORAL_TABLET | Freq: Every day | ORAL | Status: DC
Start: 2021-09-14 — End: 2021-09-12

## 2021-09-11 MED ORDER — ONDANSETRON HCL 4 MG/2ML IJ SOLN
4.0000 mg | Freq: Four times a day (QID) | INTRAMUSCULAR | Status: DC | PRN
  Administered 2021-09-11 – 2021-09-13 (×2): 4 mg via INTRAVENOUS
  Filled 2021-09-11 (×2): qty 2

## 2021-09-11 MED ORDER — ONDANSETRON HCL 4 MG PO TABS
4.0000 mg | ORAL_TABLET | Freq: Four times a day (QID) | ORAL | Status: DC | PRN
  Administered 2021-09-12 (×2): 4 mg via ORAL
  Filled 2021-09-11 (×3): qty 1

## 2021-09-11 MED ORDER — ACETAMINOPHEN 650 MG RE SUPP: 650.0000 mg | Freq: Four times a day (QID) | RECTAL | Status: DC | PRN

## 2021-09-11 MED ORDER — LACTATED RINGERS IV BOLUS
1000.0000 mL | Freq: Once | INTRAVENOUS | Status: AC
  Administered 2021-09-11: 12:00:00 1000 mL via INTRAVENOUS

## 2021-09-11 MED ORDER — THIAMINE HCL 100 MG/ML IJ SOLN
100.0000 mg | Freq: Once | INTRAMUSCULAR | Status: AC
  Administered 2021-09-11: 12:00:00 100 mg via INTRAVENOUS
  Filled 2021-09-11: qty 2

## 2021-09-11 MED ORDER — POLYETHYLENE GLYCOL 3350 17 G PO PACK: 17.0000 g | PACK | Freq: Every day | ORAL | Status: DC | PRN

## 2021-09-11 MED ORDER — ACETAMINOPHEN 325 MG PO TABS
650.0000 mg | ORAL_TABLET | Freq: Four times a day (QID) | ORAL | Status: DC | PRN
  Administered 2021-09-11: 21:00:00 650 mg via ORAL
  Filled 2021-09-11: qty 2

## 2021-09-11 MED ORDER — CHLORHEXIDINE GLUCONATE CLOTH 2 % EX PADS
6.0000 | MEDICATED_PAD | Freq: Every day | CUTANEOUS | Status: DC
  Administered 2021-09-11 – 2021-09-17 (×7): 6 via TOPICAL

## 2021-09-11 MED ORDER — NALOXONE HCL 0.4 MG/ML IJ SOLN
0.4000 mg | Freq: Once | INTRAMUSCULAR | Status: AC
  Administered 2021-09-11: 13:00:00 0.4 mg via INTRAVENOUS
  Filled 2021-09-11: qty 1

## 2021-09-11 MED ORDER — DEXTROSE 50 % IV SOLN
1.0000 | Freq: Once | INTRAVENOUS | Status: AC
  Administered 2021-09-11: 15:00:00 50 mL via INTRAVENOUS

## 2021-09-11 MED ORDER — ORAL CARE MOUTH RINSE
15.0000 mL | Freq: Two times a day (BID) | OROMUCOSAL | Status: DC
  Administered 2021-09-11 – 2021-09-12 (×2): 15 mL via OROMUCOSAL

## 2021-09-11 MED ORDER — DEXTROSE-NACL 10-0.45 % IV SOLN
INTRAVENOUS | Status: DC
Start: 2021-09-11 — End: 2021-09-16
  Administered 2021-09-11: 22:00:00 50 mL/h via INTRAVENOUS
  Filled 2021-09-11 (×12): qty 1000

## 2021-09-11 MED ORDER — DEXTROSE 50 % IV SOLN
1.0000 | Freq: Once | INTRAVENOUS | Status: AC
  Administered 2021-09-11: 12:00:00 50 mL via INTRAVENOUS
  Filled 2021-09-11: qty 50

## 2021-09-11 MED ORDER — ENOXAPARIN SODIUM 40 MG/0.4ML IJ SOSY
40.0000 mg | PREFILLED_SYRINGE | INTRAMUSCULAR | Status: DC
  Administered 2021-09-11 – 2021-09-16 (×6): 40 mg via SUBCUTANEOUS
  Filled 2021-09-11 (×6): qty 0.4

## 2021-09-11 NOTE — ED Notes (Signed)
Pt requested to call her father and spoke to him a few minutes before falling asleep with phone in hand. Pt was able to recall phone number correctly.

## 2021-09-11 NOTE — Assessment & Plan Note (Addendum)
Continue Protonix and Carafate.

## 2021-09-11 NOTE — Assessment & Plan Note (Addendum)
LFTs improved and bilirubin normal. Repeat INR normal.

## 2021-09-11 NOTE — ED Notes (Signed)
RN transported to restroom using wheelchair. Patient sat on toilet and proceeded to fall asleep. RN attempted to transfer pt back into wheelchair when pt became agitated and swatted RNs arm away. NT called for assistance transferring patient. Patient brought back into room and 2 other RNs helped transfer pt with RN. Patient repeatedly getting out of chair. Pushed RN away when attempted to help pt back into chair.

## 2021-09-11 NOTE — Assessment & Plan Note (Signed)
Although currently altered, ethyl alcohol level was undetectable.  Currently no signs of alcohol withdrawal.  Unsure when last alcoholic intake was secondary to poor mental status at this time.
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
## 2021-09-11 NOTE — Assessment & Plan Note (Addendum)
-  Continue Creon °

## 2021-09-11 NOTE — ED Triage Notes (Signed)
Per [facility] pt coming from home where roommate states patient has been altered for the past few days. Informed EMS pt is on suboxone. Patient denies taking anything. Patient placed on 2L [place] by EMS due to oxygen 90% when not stimulating patient. Other person on scene informed EMS pts mother withholds patients suboxone.

## 2021-09-11 NOTE — Assessment & Plan Note (Addendum)
Unsure of etiology. Per patient, she has only taken her prescribed medications. Patient continues to have hypoglycemia while on D10 1/2 NS IV fluids -Increase to D10 1/2 NS @ 100 ml/hr -Continue CBG q1 hour while on D10 IV fluids -C-peptide, beta-hydroxybutyric acid, proinsulin, insulin -sulfonylurea hypoglycemics panel to rule out possible accidental medication effect

## 2021-09-11 NOTE — ED Notes (Signed)
Pt has become confused and agitated.

## 2021-09-11 NOTE — ED Notes (Signed)
Pt sleeping at the moment. Pt will occasionally sit up and begin to mutter words, not making much sense, then immediately fall back asleep. Pt not combative or aggressive.
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
## 2021-09-11 NOTE — Progress Notes (Signed)
Patient was AMS/ Confused. Unable to begin scan due to safety concerns. Patient attempted to sit up several times on MRI table. Attempted to reach RN/ Physician for meds, but no RN was assigned. Patient was transferred from ER to upstairs (1241) while at MRI. [place]

## 2021-09-11 NOTE — Assessment & Plan Note (Addendum)
-  Continue Suboxone 3 times daily

## 2021-09-11 NOTE — Plan of Care (Signed)

## 2021-09-11 NOTE — Assessment & Plan Note (Addendum)
Unclear etiology.  Patient has a prior concern for Warnicke's encephalopathy.  Patient with recent acute hepatitis which appears to be resolved currently.  No history of cirrhosis or ascites.  Urine drug screen significant for THC.  CT head was negative. Slurred speech but exam is non-focal. No obvious source of infection; urinalysis unremarkable. Possibly related to hypoglycemia. Mental status back to baseline. -Discontinue high dose thiamine

## 2021-09-12 DIAGNOSIS — G43909 Migraine, unspecified, not intractable, without status migrainosus: Secondary | ICD-10-CM | POA: Diagnosis present

## 2021-09-12 DIAGNOSIS — E162 Hypoglycemia, unspecified: Secondary | ICD-10-CM | POA: Diagnosis present

## 2021-09-12 DIAGNOSIS — Z20822 Contact with and (suspected) exposure to covid-19: Secondary | ICD-10-CM | POA: Diagnosis present

## 2021-09-12 DIAGNOSIS — F1091 Alcohol use, unspecified, in remission: Secondary | ICD-10-CM | POA: Diagnosis not present

## 2021-09-12 DIAGNOSIS — Z87891 Personal history of nicotine dependence: Secondary | ICD-10-CM | POA: Diagnosis not present

## 2021-09-12 DIAGNOSIS — Z833 Family history of diabetes mellitus: Secondary | ICD-10-CM | POA: Diagnosis not present

## 2021-09-12 DIAGNOSIS — G8929 Other chronic pain: Secondary | ICD-10-CM | POA: Diagnosis present

## 2021-09-12 DIAGNOSIS — F1120 Opioid dependence, uncomplicated: Secondary | ICD-10-CM | POA: Diagnosis present

## 2021-09-12 DIAGNOSIS — K219 Gastro-esophageal reflux disease without esophagitis: Secondary | ICD-10-CM | POA: Diagnosis not present

## 2021-09-12 DIAGNOSIS — R791 Abnormal coagulation profile: Secondary | ICD-10-CM | POA: Diagnosis present

## 2021-09-12 DIAGNOSIS — Z7951 Long term (current) use of inhaled steroids: Secondary | ICD-10-CM | POA: Diagnosis not present

## 2021-09-12 DIAGNOSIS — R410 Disorientation, unspecified: Secondary | ICD-10-CM | POA: Diagnosis present

## 2021-09-12 DIAGNOSIS — R4182 Altered mental status, unspecified: Secondary | ICD-10-CM | POA: Diagnosis not present

## 2021-09-12 DIAGNOSIS — F32A Depression, unspecified: Secondary | ICD-10-CM | POA: Diagnosis present

## 2021-09-12 DIAGNOSIS — F1011 Alcohol abuse, in remission: Secondary | ICD-10-CM | POA: Diagnosis present

## 2021-09-12 DIAGNOSIS — K861 Other chronic pancreatitis: Secondary | ICD-10-CM | POA: Diagnosis not present

## 2021-09-12 DIAGNOSIS — G9341 Metabolic encephalopathy: Secondary | ICD-10-CM | POA: Diagnosis present

## 2021-09-12 DIAGNOSIS — Z79899 Other long term (current) drug therapy: Secondary | ICD-10-CM | POA: Diagnosis not present

## 2021-09-12 LAB — COMPREHENSIVE METABOLIC PANEL
ALT: 66 U/L — ABNORMAL HIGH (ref 0–44)
AST: 31 U/L (ref 15–41)
Albumin: 3.7 g/dL (ref 3.5–5.0)
Alkaline Phosphatase: 117 U/L (ref 38–126)
Anion gap: 6 (ref 5–15)
BUN: 8 mg/dL (ref 6–20)
CO2: 26 mmol/L (ref 22–32)
Calcium: 9.1 mg/dL (ref 8.9–10.3)
Chloride: 108 mmol/L (ref 98–111)
Creatinine, Ser: 0.7 mg/dL (ref 0.44–1.00)
GFR, Estimated: >60 mL/min (ref >60)
Glucose, Bld: 81 mg/dL (ref 70–99)
Potassium: 4.3 mmol/L (ref 3.5–5.1)
Sodium: 140 mmol/L (ref 135–145)
Total Bilirubin: 1 mg/dL (ref 0.3–1.2)
Total Protein: 6.7 g/dL (ref 6.5–8.1)

## 2021-09-12 LAB — BETA-HYDROXYBUTYRIC ACID: Beta-Hydroxybutyric Acid: 0.14 mmol/L (ref 0.05–0.27)

## 2021-09-12 LAB — GLUCOSE, CAPILLARY
Glucose-Capillary: 102 mg/dL — ABNORMAL HIGH (ref 70–99)
Glucose-Capillary: 103 mg/dL — ABNORMAL HIGH (ref 70–99)
Glucose-Capillary: 103 mg/dL — ABNORMAL HIGH (ref 70–99)
Glucose-Capillary: 106 mg/dL — ABNORMAL HIGH (ref 70–99)
Glucose-Capillary: 109 mg/dL — ABNORMAL HIGH (ref 70–99)
Glucose-Capillary: 115 mg/dL — ABNORMAL HIGH (ref 70–99)
Glucose-Capillary: 118 mg/dL — ABNORMAL HIGH (ref 70–99)
Glucose-Capillary: 118 mg/dL — ABNORMAL HIGH (ref 70–99)
Glucose-Capillary: 129 mg/dL — ABNORMAL HIGH (ref 70–99)
Glucose-Capillary: 138 mg/dL — ABNORMAL HIGH (ref 70–99)
Glucose-Capillary: 144 mg/dL — ABNORMAL HIGH (ref 70–99)
Glucose-Capillary: 47 mg/dL — ABNORMAL LOW (ref 70–99)
Glucose-Capillary: 54 mg/dL — ABNORMAL LOW (ref 70–99)
Glucose-Capillary: 59 mg/dL — ABNORMAL LOW (ref 70–99)
Glucose-Capillary: 65 mg/dL — ABNORMAL LOW (ref 70–99)
Glucose-Capillary: 66 mg/dL — ABNORMAL LOW (ref 70–99)
Glucose-Capillary: 71 mg/dL (ref 70–99)
Glucose-Capillary: 72 mg/dL (ref 70–99)
Glucose-Capillary: 73 mg/dL (ref 70–99)
Glucose-Capillary: 78 mg/dL (ref 70–99)
Glucose-Capillary: 78 mg/dL (ref 70–99)
Glucose-Capillary: 82 mg/dL (ref 70–99)
Glucose-Capillary: 87 mg/dL (ref 70–99)
Glucose-Capillary: 91 mg/dL (ref 70–99)

## 2021-09-12 LAB — CBC
HCT: 39.2 % (ref 36.0–46.0)
Hemoglobin: 12.7 g/dL (ref 12.0–15.0)
MCH: 29.4 pg (ref 26.0–34.0)
MCHC: 32.4 g/dL (ref 30.0–36.0)
MCV: 90.7 fL (ref 80.0–100.0)
Platelets: 218 10*3/uL (ref 150–400)
RBC: 4.32 MIL/uL (ref 3.87–5.11)
RDW: 14.3 % (ref 11.5–15.5)
WBC: 5.8 10*3/uL (ref 4.0–10.5)
nRBC: 0 % (ref 0.0–0.2)

## 2021-09-12 LAB — CORTISOL: Cortisol, Plasma: 15.8 ug/dL

## 2021-09-12 LAB — GLUCOSE, RANDOM: Glucose, Bld: 90 mg/dL (ref 70–99)

## 2021-09-12 LAB — PROTIME-INR
INR: 1 (ref 0.8–1.2)
Prothrombin Time: 12.9 seconds (ref 11.4–15.2)

## 2021-09-12 MED ORDER — THIAMINE HCL 100 MG PO TABS
100.0000 mg | ORAL_TABLET | Freq: Every day | ORAL | Status: DC
  Administered 2021-09-13 – 2021-09-17 (×5): 100 mg via ORAL
  Filled 2021-09-12 (×5): qty 1

## 2021-09-12 MED ORDER — PANTOPRAZOLE SODIUM 40 MG PO TBEC
40.0000 mg | DELAYED_RELEASE_TABLET | Freq: Every day | ORAL | Status: DC
  Administered 2021-09-12 – 2021-09-17 (×6): 40 mg via ORAL
  Filled 2021-09-12 (×6): qty 1

## 2021-09-12 MED ORDER — FERROUS SULFATE 325 (65 FE) MG PO TABS
324.0000 mg | ORAL_TABLET | Freq: Every day | ORAL | Status: DC
  Administered 2021-09-13 – 2021-09-17 (×5): 324 mg via ORAL
  Filled 2021-09-12 (×5): qty 1

## 2021-09-12 MED ORDER — PANCRELIPASE (LIP-PROT-AMYL) 36000-114000 UNITS PO CPEP
108000.0000 [IU] | ORAL_CAPSULE | Freq: Three times a day (TID) | ORAL | Status: DC
  Administered 2021-09-12 – 2021-09-17 (×14): 108000 [IU] via ORAL
  Filled 2021-09-12 (×15): qty 3

## 2021-09-12 MED ORDER — SUCRALFATE 1 G PO TABS
1.0000 g | ORAL_TABLET | Freq: Three times a day (TID) | ORAL | Status: DC
  Administered 2021-09-12 – 2021-09-17 (×14): 1 g via ORAL
  Filled 2021-09-12 (×14): qty 1

## 2021-09-12 MED ORDER — DULOXETINE HCL 30 MG PO CPEP
60.0000 mg | ORAL_CAPSULE | Freq: Every day | ORAL | Status: DC
  Administered 2021-09-12 – 2021-09-17 (×6): 60 mg via ORAL
  Filled 2021-09-12 (×6): qty 2

## 2021-09-12 MED ORDER — PANCRELIPASE (LIP-PROT-AMYL) 36000-114000 UNITS PO CPEP
36000.0000 [IU] | ORAL_CAPSULE | Freq: Three times a day (TID) | ORAL | Status: DC | PRN
  Filled 2021-09-12 (×2): qty 1

## 2021-09-12 MED ORDER — DEXTROSE 50 % IV SOLN
25.0000 g | INTRAVENOUS | Status: AC
  Administered 2021-09-12: 10:00:00 25 g via INTRAVENOUS

## 2021-09-12 MED ORDER — FLUTICASONE FUROATE-VILANTEROL 100-25 MCG/ACT IN AEPB
1.0000 | INHALATION_SPRAY | Freq: Every day | RESPIRATORY_TRACT | Status: DC
  Administered 2021-09-13 – 2021-09-17 (×5): 1 via RESPIRATORY_TRACT
  Filled 2021-09-12: qty 28

## 2021-09-12 MED ORDER — ADULT MULTIVITAMIN W/MINERALS CH
1.0000 | ORAL_TABLET | Freq: Every day | ORAL | Status: DC
  Administered 2021-09-12 – 2021-09-17 (×6): 1 via ORAL
  Filled 2021-09-12 (×6): qty 1

## 2021-09-12 MED ORDER — BUPRENORPHINE HCL-NALOXONE HCL 8-2 MG SL SUBL
1.0000 | SUBLINGUAL_TABLET | Freq: Three times a day (TID) | SUBLINGUAL | Status: DC
  Administered 2021-09-12 – 2021-09-17 (×16): 1 via SUBLINGUAL
  Filled 2021-09-12 (×16): qty 1

## 2021-09-12 MED ORDER — IBUPROFEN 200 MG PO TABS
600.0000 mg | ORAL_TABLET | Freq: Four times a day (QID) | ORAL | Status: DC | PRN
  Administered 2021-09-12 – 2021-09-13 (×2): 600 mg via ORAL
  Filled 2021-09-12 (×2): qty 3

## 2021-09-12 MED ORDER — RIZATRIPTAN BENZOATE 10 MG PO TBDP: 10.0000 mg | ORAL_TABLET | Freq: Every day | ORAL | Status: DC | PRN

## 2021-09-12 MED ORDER — BOOST / RESOURCE BREEZE PO LIQD CUSTOM
1.0000 | Freq: Three times a day (TID) | ORAL | Status: DC
  Administered 2021-09-12 – 2021-09-17 (×15): 1 via ORAL

## 2021-09-12 MED ORDER — GABAPENTIN 300 MG PO CAPS
300.0000 mg | ORAL_CAPSULE | Freq: Three times a day (TID) | ORAL | Status: DC
  Administered 2021-09-12 – 2021-09-17 (×15): 300 mg via ORAL
  Filled 2021-09-12 (×15): qty 1

## 2021-09-12 MED ORDER — SUMATRIPTAN SUCCINATE 50 MG PO TABS
100.0000 mg | ORAL_TABLET | ORAL | Status: DC | PRN
  Administered 2021-09-12 – 2021-09-14 (×6): 100 mg via ORAL
  Filled 2021-09-12 (×9): qty 2

## 2021-09-12 NOTE — Progress Notes (Signed)
PROGRESS NOTE    Tricia Brown  TZG:[id number] DOB: 1976-05-26 DOA: 09/11/2021 PCP: [facility]   Brief Narrative: Tricia Brown is a 45 y.o. female with medical history significant of chronic pancreatitis, recent acute hepatitis, depression, opiate use disorder, migraine headaches. Patient presented with altered mental status possibly secondary to profound hypoglycemia. Mental status improved slowly after correction of blood sugar. High dose thiamine initiated but patient with significant improvement of mental status back to baseline with one dose. Patient continues to have hypoglycemia requiring continuous infusion of D10 containing IV fluids.   Assessment & Plan:   * AMS (altered mental status)-resolved as of 09/12/2021, (present on admission) Unclear etiology.  Patient has a prior concern for Warnicke's encephalopathy.  Patient with recent acute hepatitis which appears to be resolved currently.  No history of cirrhosis or ascites.  Urine drug screen significant for THC.  CT head was negative. Slurred speech but exam is non-focal. No obvious source of infection; urinalysis unremarkable. Possibly related to hypoglycemia. Mental status back to baseline. -Discontinue high dose thiamine  Migraine -Resume home triptan prn -Ibuprofen prn  Hypoglycemia without diagnosis of diabetes mellitus Unsure of etiology. Per patient, she has only taken her prescribed medications. Patient continues to have hypoglycemia while on D10 1/2 NS IV fluids -Increase to D10 1/2 NS @ 100 ml/hr -Continue CBG q1 hour while on D10 IV fluids -C-peptide, beta-hydroxybutyric acid, proinsulin, insulin -sulfonylurea hypoglycemics panel to rule out possible accidental medication effect  Chronic pancreatitis (HCC)- (present on admission) -Continue Creon  Gastroesophageal reflux disease without esophagitis- (present on admission) -Continue Protonix and Carafate  Alcohol use disorder in remission-  (present on admission) Although currently altered, ethyl alcohol level was undetectable.  Currently no signs of alcohol withdrawal.  Unsure when last alcoholic intake was secondary to poor mental status at this time.  Opioid use disorder, severe, dependence (HCC)- (present on admission) -Continue Suboxone 3 times daily  Elevated INR-resolved as of 09/12/2021 LFTs improved and bilirubin normal. Repeat INR normal.    DVT prophylaxis: Lovenox Code Status:   Code Status: Full Code Family Communication: None at bedside Disposition Plan: Discharge home in 1-3 days pending ability to discontinue D10 IV fluids and stable blood sugar   Consultants:  None  Procedures:  None  Antimicrobials: None    Subjective: Feeling much better today. Headache this morning in addition to her chronic pain.   Objective: Vitals:   09/12/21 0520 09/12/21 0600 09/12/21 0700 09/12/21 0838  BP: 122/87 125/77 109/72   Pulse: 83 79 75   Resp: (!) 29 (!) 21 12   Temp:    97.7 F (36.5 C)  TempSrc:    Oral  SpO2: 97% 100% 97%   Weight:      Height:        Intake/Output Summary (Last 24 hours) at 09/12/2021 0941 Last data filed at 09/12/2021 0700 Gross per 24 hour  Intake 1838.26 ml  Output 1500 ml  Net 338.26 ml   Filed Weights   09/11/21 1053 09/11/21 2015 09/12/21 0310  Weight: 68 kg 71.5 kg 69.4 kg    Examination:  General exam: Appears calm and comfortable Respiratory system: Clear to auscultation. Respiratory effort normal. Cardiovascular system: S1 & S2 heard, RRR. No murmurs, rubs, gallops or clicks. Gastrointestinal system: Abdomen is nondistended, soft and nontender. No organomegaly or masses felt. Normal bowel sounds heard. Central nervous system: Alert and oriented. No focal neurological deficits. No tremors. Musculoskeletal: No edema. No calf tenderness Skin: No cyanosis.  No rashes Psychiatry: Judgement and insight appear normal. Mood & affect appropriate.     Data  Reviewed: I have personally reviewed following labs and imaging studies  CBC Lab Results  Component Value Date   WBC 5.8 09/12/2021   RBC 4.32 09/12/2021   HGB 12.7 09/12/2021   HCT 39.2 09/12/2021   MCV 90.7 09/12/2021   MCH 29.4 09/12/2021   PLT 218 09/12/2021   MCHC 32.4 09/12/2021   RDW 14.3 09/12/2021   LYMPHSABS 1.0 09/11/2021   MONOABS 0.6 09/11/2021   EOSABS 0.1 09/11/2021   BASOSABS 0.1 09/11/2021     Last metabolic panel Lab Results  Component Value Date   NA 140 09/12/2021   K 4.3 09/12/2021   CL 108 09/12/2021   CO2 26 09/12/2021   BUN 8 09/12/2021   CREATININE 0.70 09/12/2021   GLUCOSE 81 09/12/2021   GFRNONAA >60 09/12/2021   GFRAA >60 12/18/2019   CALCIUM 9.1 09/12/2021   PHOS 4.9 (H) 10/07/2020   PROT 6.7 09/12/2021   ALBUMIN 3.7 09/12/2021   BILITOT 1.0 09/12/2021   ALKPHOS 117 09/12/2021   AST 31 09/12/2021   ALT 66 (H) 09/12/2021   ANIONGAP 6 09/12/2021    CBG (last 3)  Recent Labs    09/12/21 0741 09/12/21 0826 09/12/21 0857  GLUCAP 78 82 78     GFR: Estimated Creatinine Clearance: 86.4 mL/min (by C-G formula based on SCr of 0.7 mg/dL).  Coagulation Profile: Recent Labs  Lab 09/11/21 1224 09/12/21 0330  INR 1.4* 1.0    Recent Results (from the past 240 hour(s))  Resp Panel by RT-PCR (Flu A&B, Covid) Urine, Clean Catch     Status: None   Collection Time: 09/11/21  3:53 PM   Specimen: Urine, Clean Catch; Nasopharyngeal(NP) swabs in vial transport medium  Result Value Ref Range Status   SARS Coronavirus 2 by RT PCR NEGATIVE NEGATIVE Final    Comment: (NOTE) SARS-CoV-2 target nucleic acids are NOT DETECTED.  The SARS-CoV-2 RNA is generally detectable in upper respiratory specimens during the acute phase of infection. The lowest concentration of SARS-CoV-2 viral copies this assay can detect is 138 copies/mL. A negative result does not preclude SARS-Cov-2 infection and should not be used as the sole basis for treatment  or other patient management decisions. A negative result may occur with  improper specimen collection/handling, submission of specimen other than nasopharyngeal swab, presence of viral mutation(s) within the areas targeted by this assay, and inadequate number of viral copies(<138 copies/mL). A negative result must be combined with clinical observations, patient history, and epidemiological information. The expected result is Negative.  Fact Sheet for Patients:  BloggerCourse.com  Fact Sheet for Healthcare Providers:  SeriousBroker.it  This test is no t yet approved or cleared by the [place] FDA and  has been authorized for detection and/or diagnosis of SARS-CoV-2 by FDA under an Emergency Use Authorization (EUA). This EUA will remain  in effect (meaning this test can be used) for the duration of the COVID-19 declaration under Section 564(b)(1) of the Act, 21 U.S.C.section 360bbb-3(b)(1), unless the authorization is terminated  or revoked sooner.       Influenza A by PCR NEGATIVE NEGATIVE Final   Influenza B by PCR NEGATIVE NEGATIVE Final    Comment: (NOTE) The Xpert Xpress SARS-CoV-2/FLU/RSV plus assay is intended as an aid in the diagnosis of influenza from Nasopharyngeal swab specimens and should not be used as a sole basis for treatment. Nasal washings and aspirates are unacceptable  for Xpert Xpress SARS-CoV-2/FLU/RSV testing.  Fact Sheet for Patients: BloggerCourse.com  Fact Sheet for Healthcare Providers: SeriousBroker.it  This test is not yet approved or cleared by the [place] FDA and has been authorized for detection and/or diagnosis of SARS-CoV-2 by FDA under an Emergency Use Authorization (EUA). This EUA will remain in effect (meaning this test can be used) for the duration of the COVID-19 declaration under Section 564(b)(1) of the Act, 21 U.S.C. section  360bbb-3(b)(1), unless the authorization is terminated or revoked.  Performed at [facility], [address] [place], [place] [address]   MRSA Next Gen by PCR, Nasal     Status: None   Collection Time: 09/11/21  8:33 PM   Specimen: Nasal Mucosa; Nasal Swab  Result Value Ref Range Status   MRSA by PCR Next Gen NOT DETECTED NOT DETECTED Final    Comment: (NOTE) The GeneXpert MRSA Assay (FDA approved for NASAL specimens only), is one component of a comprehensive MRSA colonization surveillance program. It is not intended to diagnose MRSA infection nor to guide or monitor treatment for MRSA infections. Test performance is not FDA approved in patients less than 58 years old. Performed at [facility], [address], [place], [place] [address]         Radiology Studies: CT Head Wo Contrast  Result Date: 09/11/2021 CLINICAL DATA:  Mental status change, unknown cause EXAM: CT HEAD WITHOUT CONTRAST TECHNIQUE: Contiguous axial images were obtained from the base of the skull through the vertex without intravenous contrast. COMPARISON:  06/08/2021 FINDINGS: Brain: There is no acute intracranial hemorrhage, mass effect, or edema. Gray-white differentiation is preserved. There is no extra-axial fluid collection. Ventricles and sulci are within normal limits in size and configuration. Vascular: No hyperdense vessel or unexpected calcification. Skull: Calvarium is unremarkable. Sinuses/Orbits: No acute finding. Other: None. IMPRESSION: No acute intracranial abnormality. Electronically Signed   By: Guadlupe Spanish M.D.   On: 09/11/2021 11:49        Scheduled Meds:  buprenorphine-naloxone  1 tablet Sublingual TID   Chlorhexidine Gluconate Cloth  6 each Topical Daily   enoxaparin (LOVENOX) injection  40 mg Subcutaneous Q24H   mouth rinse  15 mL Mouth Rinse BID   nicotine  14 mg Transdermal Daily   [START ON 09/14/2021] thiamine  100 mg Oral Daily    Continuous Infusions:  dextrose 10 % and 0.45 % NaCl 75 mL/hr (09/12/21 0606)   thiamine injection Stopped (09/11/21 2123)     LOS: 0 days     Jacquelin Hawking, MD [facility] 09/12/2021, 9:41 AM  If 7PM-7AM, please contact night-coverage [email or web address]

## 2021-09-12 NOTE — Progress Notes (Signed)
OT Cancellation Note  Patient Details Name: Tricia Brown MRN: [id number] DOB: 1976/02/08   Cancelled Treatment:    Reason Eval/Treat Not Completed: OT screened, no needs identified, will sign off. Per PT patient has no therapy needs. Patient has been independent with toileting and self care tasks and ambulating in room independently. OT evaluation not indicated at this time.   [name] [name] 09/12/2021, 1:06 PM

## 2021-09-12 NOTE — Discharge Instructions (Signed)
Reminder: Vitamin C can help with the absorption of iron.

## 2021-09-12 NOTE — Assessment & Plan Note (Addendum)
-  Resume home triptan prn -Ibuprofen prn

## 2021-09-12 NOTE — Hospital Course (Addendum)
Tricia Brown is a 45 y.o. female with medical history significant of chronic pancreatitis, recent acute hepatitis, depression, opiate use disorder, migraine headaches. Patient presented with altered mental status possibly secondary to profound hypoglycemia. Mental status improved slowly after correction of blood sugar. High dose thiamine initiated but patient with significant improvement of mental status back to baseline with one dose. Patient continues to have hypoglycemia requiring continuous infusion of D10 containing IV fluids.

## 2021-09-12 NOTE — Progress Notes (Signed)
Initial Nutrition Assessment  INTERVENTION:   -Boost Breeze po TID, each supplement provides 250 kcal and 9 grams of protein  -Multivitamin with minerals daily  NUTRITION DIAGNOSIS:   Increased nutrient needs related to acute illness as evidenced by estimated needs.  GOAL:   Patient will meet greater than or equal to 90% of their needs  MONITOR:   PO intake, Supplement acceptance, Labs, Weight trends, I & O's  REASON FOR ASSESSMENT:   Malnutrition Screening Tool    ASSESSMENT:   45 y.o. female with medical history significant of chronic pancreatitis, recent acute hepatitis, depression, opiate use disorder, migraine headaches.  Patient unable to provide history secondary to altered mental status  Patient more alert today. Pt reports she has been eating okay but still has her days where she won't eat anything but continues to drink liquids. She is interested in having [employer] ordered again. Pt takes a multivitamin at home and iron. Discussed how vitamin C helps with iron absorption.   Per weight records, pt has lost 27 lbs since 7/4 ( 15% wt loss x 7.5 months, significant for time frame).  Medications: Creon, Carafate, Thiamine, D10 infusion, Zofran  Labs reviewed: CBGs: 106-144  NUTRITION - FOCUSED PHYSICAL EXAM:  Flowsheet Row Most Recent Value  Orbital Region No depletion  Upper Arm Region No depletion  Thoracic and Lumbar Region No depletion  Buccal Region No depletion  Temple Region No depletion  Clavicle Bone Region Mild depletion  Clavicle and Acromion Bone Region No depletion  Scapular Bone Region No depletion  Dorsal Hand No depletion  Patellar Region No depletion  Anterior Thigh Region No depletion  Posterior Calf Region No depletion  Edema (RD Assessment) None  Hair Reviewed       Diet Order:   Diet Order             Diet 2 gram sodium Room service appropriate? Yes; Fluid consistency: Thin  Diet effective now                    EDUCATION NEEDS:   Education needs have been addressed  Skin:  Skin Assessment: Reviewed RN Assessment  Last BM:  12/22  Height:   Ht Readings from Last 1 Encounters:  09/11/21 5\' 7"  (1.702 m)    Weight:   Wt Readings from Last 1 Encounters:  09/12/21 69.4 kg    BMI:  Body mass index is 23.96 kg/m.  Estimated Nutritional Needs:   Kcal:  2000-2200  Protein:  95-105g  Fluid:  2L/day   09/14/21, MS, RD, LDN Inpatient Clinical Dietitian Contact information available via Amion

## 2021-09-12 NOTE — Evaluation (Signed)
Physical Therapy Evaluation Patient Details Name: Tricia Brown MRN: [id number] DOB: 06-13-1976 Today's Date: 09/12/2021  History of Present Illness  Tricia Brown is a 45 y.o. female with medical history significant of chronic pancreatitis, recent acute hepatitis, depression, opiate use disorder, migraine headaches.  Patient unable to provide history secondary to altered mental status. CT head negative.  Clinical Impression  The patient is mobilizing well. Ambulated x 400' holding IV pole. Patient tolerated well.   Patient stood up and down  multiple times, reaches for items, no balance loss noted. Patient reports feeling  weak at end of distance. Patient reports that she still "feels  like I  am not thinking straight." Patient reports" I am going home before Christmas, I missed it last year."   Patient has family and friends at home and is near baseline. No further PT indicated. Patient can ambulate with staff  at any time. PT signing off.     Recommendations for follow up therapy are one component of a multi-disciplinary discharge planning process, led by the attending physician.  Recommendations may be updated based on patient status, additional functional criteria and insurance authorization.  Follow Up Recommendations No PT follow up    Assistance Recommended at Discharge PRN  Functional Status Assessment Patient has not had a recent decline in their functional status  Equipment Recommendations  None recommended by PT    Recommendations for Other Services       Precautions / Restrictions Precautions Precaution Comments: CBG's run low      Mobility  Bed Mobility Overal bed mobility: Independent                  Transfers Overall transfer level: Needs assistance Equipment used: None Transfers: Sit to/from Stand;Bed to chair/wheelchair/BSC Sit to Stand: Supervision Stand pivot transfers: Supervision Step pivot transfers: Supervision       General transfer  comment: for safety of lines, impulsive    Ambulation/Gait Ambulation/Gait assistance: Supervision;Min guard Gait Distance (Feet): 400 Feet Assistive device: IV Pole Gait Pattern/deviations: Step-through pattern Gait velocity: decr     General Gait Details: gait steady with pole, reports fatigue at end  Stairs            Wheelchair Mobility    Modified Rankin (Stroke Patients Only)       Balance Overall balance assessment: Independent;Mild deficits observed, not formally tested                                           Pertinent Vitals/Pain Pain Assessment: No/denies pain    Home Living Family/patient expects to be discharged to:: Private residence Living Arrangements: Children;Non-relatives/Friends Available Help at Discharge: Family;Available PRN/intermittently Type of Home: House Home Access: Stairs to enter;Ramped entrance   Entrance Stairs-Number of Steps: 4   Home Layout: One level Home Equipment: Agricultural consultant (2 wheels);Cane - single point;BSC/3in1 Additional Comments: left over supplies from her mother    Prior Function Prior Level of Function : Independent/Modified Independent                     Hand Dominance   Dominant Hand: Right    Extremity/Trunk Assessment   Upper Extremity Assessment Upper Extremity Assessment: Overall WFL for tasks assessed    Lower Extremity Assessment Lower Extremity Assessment: Overall WFL for tasks assessed    Cervical / Trunk Assessment Cervical / Trunk Assessment: Normal  Communication   Communication: No difficulties (pt feels that her words are not coming out correctly)  Cognition Arousal/Alertness: Awake/alert Behavior During Therapy: [facility] for tasks assessed/performed;Impulsive Overall Cognitive Status: Within Functional Limits for tasks assessed                                          General Comments      Exercises     Assessment/Plan    PT  Assessment Patient needs continued PT services;Patient does not need any further PT services  PT Problem List Decreased strength;Decreased activity tolerance       PT Treatment Interventions      PT Goals (Current goals can be found in the Care Plan section)  Acute Rehab PT Goals Patient Stated Goal: i am leaving here to niight PT Goal Formulation: All assessment and education complete, DC therapy    Frequency     Barriers to discharge        Co-evaluation               AM-PAC PT "6 Clicks" Mobility  Outcome Measure Help needed turning from your back to your side while in a flat bed without using bedrails?: None Help needed moving from lying on your back to sitting on the side of a flat bed without using bedrails?: None   Help needed standing up from a chair using your arms (e.g., wheelchair or bedside chair)?: None Help needed to walk in hospital room?: None Help needed climbing 3-5 steps with a railing? : A Little 6 Click Score: 19    End of Session Equipment Utilized During Treatment: Gait belt Activity Tolerance: Patient tolerated treatment well Patient left: in chair;with call bell/phone within reach;with chair alarm set Nurse Communication: Mobility status PT Visit Diagnosis: Unsteadiness on feet (R26.81)    Time: 3662-9476 PT Time Calculation (min) (ACUTE ONLY): 35 min   Charges:   PT Evaluation $PT Eval Low Complexity: 1 Low PT Treatments $Gait Training: 8-22 mins        Blanchard Kelch PT Acute Rehabilitation Services Pager [phone] Office [phone]    Rada Hay 09/12/2021, 12:52 PM

## 2021-09-13 DIAGNOSIS — G9341 Metabolic encephalopathy: Secondary | ICD-10-CM

## 2021-09-13 DIAGNOSIS — F1091 Alcohol use, unspecified, in remission: Secondary | ICD-10-CM | POA: Diagnosis not present

## 2021-09-13 DIAGNOSIS — K861 Other chronic pancreatitis: Secondary | ICD-10-CM | POA: Diagnosis not present

## 2021-09-13 DIAGNOSIS — E162 Hypoglycemia, unspecified: Secondary | ICD-10-CM | POA: Diagnosis not present

## 2021-09-13 LAB — BASIC METABOLIC PANEL WITH GFR
Anion gap: 7 (ref 5–15)
BUN: 5 mg/dL — ABNORMAL LOW (ref 6–20)
CO2: 23 mmol/L (ref 22–32)
Calcium: 8.5 mg/dL — ABNORMAL LOW (ref 8.9–10.3)
Chloride: 109 mmol/L (ref 98–111)
Creatinine, Ser: 0.67 mg/dL (ref 0.44–1.00)
GFR, Estimated: >60 mL/min
Glucose, Bld: 55 mg/dL — ABNORMAL LOW (ref 70–99)
Potassium: 3.6 mmol/L (ref 3.5–5.1)
Sodium: 139 mmol/L (ref 135–145)

## 2021-09-13 LAB — GLUCOSE, CAPILLARY
Glucose-Capillary: 101 mg/dL — ABNORMAL HIGH (ref 70–99)
Glucose-Capillary: 101 mg/dL — ABNORMAL HIGH (ref 70–99)
Glucose-Capillary: 106 mg/dL — ABNORMAL HIGH (ref 70–99)
Glucose-Capillary: 107 mg/dL — ABNORMAL HIGH (ref 70–99)
Glucose-Capillary: 108 mg/dL — ABNORMAL HIGH (ref 70–99)
Glucose-Capillary: 113 mg/dL — ABNORMAL HIGH (ref 70–99)
Glucose-Capillary: 114 mg/dL — ABNORMAL HIGH (ref 70–99)
Glucose-Capillary: 114 mg/dL — ABNORMAL HIGH (ref 70–99)
Glucose-Capillary: 118 mg/dL — ABNORMAL HIGH (ref 70–99)
Glucose-Capillary: 123 mg/dL — ABNORMAL HIGH (ref 70–99)
Glucose-Capillary: 130 mg/dL — ABNORMAL HIGH (ref 70–99)
Glucose-Capillary: 64 mg/dL — ABNORMAL LOW (ref 70–99)
Glucose-Capillary: 66 mg/dL — ABNORMAL LOW (ref 70–99)
Glucose-Capillary: 69 mg/dL — ABNORMAL LOW (ref 70–99)
Glucose-Capillary: 82 mg/dL (ref 70–99)
Glucose-Capillary: 82 mg/dL (ref 70–99)
Glucose-Capillary: 88 mg/dL (ref 70–99)
Glucose-Capillary: 91 mg/dL (ref 70–99)
Glucose-Capillary: 92 mg/dL (ref 70–99)
Glucose-Capillary: 97 mg/dL (ref 70–99)
Glucose-Capillary: 99 mg/dL (ref 70–99)

## 2021-09-13 LAB — INSULIN, RANDOM: Insulin: 30.5 u[IU]/mL — ABNORMAL HIGH (ref 2.6–24.9)

## 2021-09-13 LAB — C-PEPTIDE: C-Peptide: 4.7 ng/mL — ABNORMAL HIGH (ref 1.1–4.4)

## 2021-09-13 MED ORDER — DEXTROSE 50 % IV SOLN
INTRAVENOUS | Status: AC
  Administered 2021-09-13: 12:00:00 12.5 g via INTRAVENOUS
  Filled 2021-09-13: qty 50

## 2021-09-13 MED ORDER — DEXTROSE 50 % IV SOLN: 12.5000 g | INTRAVENOUS | Status: AC

## 2021-09-13 MED ORDER — GLUCOSE 40 % PO GEL: 1.0000 | ORAL | Status: AC

## 2021-09-13 MED ORDER — GLUCOSE 40 % PO GEL
ORAL | Status: AC
  Administered 2021-09-13: 11:00:00 31 g via ORAL
  Filled 2021-09-13: qty 1.21

## 2021-09-13 NOTE — Progress Notes (Signed)
PROGRESS NOTE    Tricia Brown  IDP:[id number] DOB: Dec 04, 1975 DOA: 09/11/2021 PCP: [facility]   Brief Narrative: Tricia Brown is a 45 y.o. female with medical history significant of chronic pancreatitis, recent acute hepatitis, depression, opiate use disorder, migraine headaches. Patient presented with altered mental status possibly secondary to profound hypoglycemia. Mental status improved slowly after correction of blood sugar. High dose thiamine initiated but patient with significant improvement of mental status back to baseline with one dose. Patient continues to have hypoglycemia requiring continuous infusion of D10 containing IV fluids.   Assessment & Plan:   Acute metabolic encephalopathy Unclear etiology, possibly related to hypoglycemia, currently resolved Patient has a prior concern for ??Wernicke's encephalopathy Patient with recent acute hepatitis which appears to be resolved currently, no history of cirrhosis or ascites Urine drug screen significant for THC CT head was negative No obvious source of infection; urinalysis unremarkable. Possibly related to hypoglycemia Discontinue high dose thiamine  Hypoglycemia without diagnosis of diabetes mellitus Unsure of etiology Per patient, she has only taken her prescribed medications Patient continues to have hypoglycemia while on D10 1/2 NS IV fluids Continue CBG q1 hour while on D10 IV fluids C-peptide mildly elevated at 4.7, beta-hydroxybutyric acid WNL, insulin 30.5 elevated proinsulin/insulin ratio pending, sulfonylurea hypoglycemics panel to rule out possible accidental medication effect  Migraine Resume home triptan prn Ibuprofen prn  Chronic pancreatitis (HCC)- (present on admission) Continue Creon  Gastroesophageal reflux disease without esophagitis- (present on admission) Continue Protonix and Carafate  Alcohol use disorder in remission- (present on admission) Alcohol level was undetectable,  reported quit drinking alcohol Currently no signs of alcohol withdrawal  Opioid use disorder, severe, dependence (HCC)- (present on admission) Continue Suboxone 3 times daily    DVT prophylaxis: Lovenox Code Status:   Code Status: Full Code Family Communication: None at bedside Disposition Plan: Discharge home pending resolution of hypoglycemia  Consultants:  None  Procedures:  None  Antimicrobials: None    Subjective: Patient denies any new complaints.  Still with hypoglycemic episodes requiring D10   Objective: Vitals:   09/13/21 1600 09/13/21 1700 09/13/21 1800 09/13/21 1900  BP: 113/83 120/74 129/78 130/76  Pulse: 85 78 75 79  Resp: 15 20 16 17   Temp: 98.5 F (36.9 C)     TempSrc: Oral     SpO2: 100% 95% 97% 96%  Weight:      Height:        Intake/Output Summary (Last 24 hours) at 09/13/2021 1907 Last data filed at 09/13/2021 1800 Gross per 24 hour  Intake 2356.61 ml  Output 1925 ml  Net 431.61 ml   Filed Weights   09/11/21 1053 09/11/21 2015 09/12/21 0310  Weight: 68 kg 71.5 kg 69.4 kg    Examination: General: NAD  Cardiovascular: S1, S2 present Respiratory: CTAB Abdomen: Soft, nontender, nondistended, bowel sounds present Musculoskeletal: No bilateral pedal edema noted Skin: Normal Psychiatry: Normal mood      Data Reviewed: I have personally reviewed following labs and imaging studies  CBC Lab Results  Component Value Date   WBC 5.8 09/12/2021   RBC 4.32 09/12/2021   HGB 12.7 09/12/2021   HCT 39.2 09/12/2021   MCV 90.7 09/12/2021   MCH 29.4 09/12/2021   PLT 218 09/12/2021   MCHC 32.4 09/12/2021   RDW 14.3 09/12/2021   LYMPHSABS 1.0 09/11/2021   MONOABS 0.6 09/11/2021   EOSABS 0.1 09/11/2021   BASOSABS 0.1 09/11/2021     Last metabolic panel Lab Results  Component  Value Date   NA 139 09/13/2021   K 3.6 09/13/2021   CL 109 09/13/2021   CO2 23 09/13/2021   BUN 5 (L) 09/13/2021   CREATININE 0.67 09/13/2021   GLUCOSE 55  (L) 09/13/2021   GFRNONAA >60 09/13/2021   GFRAA >60 12/18/2019   CALCIUM 8.5 (L) 09/13/2021   PHOS 4.9 (H) 10/07/2020   PROT 6.7 09/12/2021   ALBUMIN 3.7 09/12/2021   BILITOT 1.0 09/12/2021   ALKPHOS 117 09/12/2021   AST 31 09/12/2021   ALT 66 (H) 09/12/2021   ANIONGAP 7 09/13/2021    CBG (last 3)  Recent Labs    09/13/21 1558 09/13/21 1748 09/13/21 1851  GLUCAP 82 114* 130*     GFR: Estimated Creatinine Clearance: 86.4 mL/min (by C-G formula based on SCr of 0.67 mg/dL).  Coagulation Profile: Recent Labs  Lab 09/11/21 1224 09/12/21 0330  INR 1.4* 1.0    Recent Results (from the past 240 hour(s))  Resp Panel by RT-PCR (Flu A&B, Covid) Urine, Clean Catch     Status: None   Collection Time: 09/11/21  3:53 PM   Specimen: Urine, Clean Catch; Nasopharyngeal(NP) swabs in vial transport medium  Result Value Ref Range Status   SARS Coronavirus 2 by RT PCR NEGATIVE NEGATIVE Final    Comment: (NOTE) SARS-CoV-2 target nucleic acids are NOT DETECTED.  The SARS-CoV-2 RNA is generally detectable in upper respiratory specimens during the acute phase of infection. The lowest concentration of SARS-CoV-2 viral copies this assay can detect is 138 copies/mL. A negative result does not preclude SARS-Cov-2 infection and should not be used as the sole basis for treatment or other patient management decisions. A negative result may occur with  improper specimen collection/handling, submission of specimen other than nasopharyngeal swab, presence of viral mutation(s) within the areas targeted by this assay, and inadequate number of viral copies(<138 copies/mL). A negative result must be combined with clinical observations, patient history, and epidemiological information. The expected result is Negative.  Fact Sheet for Patients:  BloggerCourse.com  Fact Sheet for Healthcare Providers:  SeriousBroker.it  This test is no t yet  approved or cleared by the [place] FDA and  has been authorized for detection and/or diagnosis of SARS-CoV-2 by FDA under an Emergency Use Authorization (EUA). This EUA will remain  in effect (meaning this test can be used) for the duration of the COVID-19 declaration under Section 564(b)(1) of the Act, 21 U.S.C.section 360bbb-3(b)(1), unless the authorization is terminated  or revoked sooner.       Influenza A by PCR NEGATIVE NEGATIVE Final   Influenza B by PCR NEGATIVE NEGATIVE Final    Comment: (NOTE) The Xpert Xpress SARS-CoV-2/FLU/RSV plus assay is intended as an aid in the diagnosis of influenza from Nasopharyngeal swab specimens and should not be used as a sole basis for treatment. Nasal washings and aspirates are unacceptable for Xpert Xpress SARS-CoV-2/FLU/RSV testing.  Fact Sheet for Patients: BloggerCourse.com  Fact Sheet for Healthcare Providers: SeriousBroker.it  This test is not yet approved or cleared by the [place] FDA and has been authorized for detection and/or diagnosis of SARS-CoV-2 by FDA under an Emergency Use Authorization (EUA). This EUA will remain in effect (meaning this test can be used) for the duration of the COVID-19 declaration under Section 564(b)(1) of the Act, 21 U.S.C. section 360bbb-3(b)(1), unless the authorization is terminated or revoked.  Performed at [facility], [address], [place], [place] [address]   MRSA Next Gen by PCR, Nasal  Status: None   Collection Time: 09/11/21  8:33 PM   Specimen: Nasal Mucosa; Nasal Swab  Result Value Ref Range Status   MRSA by PCR Next Gen NOT DETECTED NOT DETECTED Final    Comment: (NOTE) The GeneXpert MRSA Assay (FDA approved for NASAL specimens only), is one component of a comprehensive MRSA colonization surveillance program. It is not intended to diagnose MRSA infection nor to guide or monitor treatment for  MRSA infections. Test performance is not FDA approved in patients less than 60 years old. Performed at [facility], [address], [place], [place] [address]         Radiology Studies: No results found.      Scheduled Meds:  buprenorphine-naloxone  1 tablet Sublingual TID   Chlorhexidine Gluconate Cloth  6 each Topical Daily   DULoxetine  60 mg Oral Daily   enoxaparin (LOVENOX) injection  40 mg Subcutaneous Q24H   feeding supplement  1 Container Oral TID BM   ferrous sulfate  324 mg Oral Q breakfast   fluticasone furoate-vilanterol  1 puff Inhalation Daily   gabapentin  300 mg Oral TID   lipase/protease/amylase  108,000 Units Oral TID WC   multivitamin with minerals  1 tablet Oral Daily   nicotine  14 mg Transdermal Daily   pantoprazole  40 mg Oral Daily   sucralfate  1 g Oral TID AC   thiamine  100 mg Oral Daily   Continuous Infusions:  dextrose 10 % and 0.45 % NaCl 100 mL/hr at 09/13/21 1800     LOS: 1 day     Briant Cedar, MD [facility] 09/13/2021, 7:07 PM  If 7PM-7AM, please contact night-coverage [email or web address]

## 2021-09-13 NOTE — Plan of Care (Signed)
  Problem: Clinical Measurements: Goal: Respiratory complications will improve Outcome: Progressing   Problem: Activity: Goal: Risk for activity intolerance will decrease Outcome: Progressing   Problem: Nutrition: Goal: Adequate nutrition will be maintained Outcome: Progressing   Problem: Pain Managment: Goal: General experience of comfort will improve Outcome: Progressing   

## 2021-09-13 NOTE — Progress Notes (Signed)
Hypoglycemic Event  CBG: 64 at 1106h  Treatment: 1 tube glucose gel at 1112h, finished at 1120h  Symptoms: Lightheaded  Follow-up CBG: Time:1136h CBG Result: 69  Treatment: 12.5 g (half amp) of dextrose 50% solution  Follow-up CBG: Time:1225h CBG Result: 118  Possible Reasons for Event: Unknown  Comments/MD notified: Dr. Sharolyn Douglas

## 2021-09-14 DIAGNOSIS — G9341 Metabolic encephalopathy: Secondary | ICD-10-CM | POA: Diagnosis not present

## 2021-09-14 DIAGNOSIS — K861 Other chronic pancreatitis: Secondary | ICD-10-CM | POA: Diagnosis not present

## 2021-09-14 DIAGNOSIS — E162 Hypoglycemia, unspecified: Secondary | ICD-10-CM | POA: Diagnosis not present

## 2021-09-14 DIAGNOSIS — F1091 Alcohol use, unspecified, in remission: Secondary | ICD-10-CM | POA: Diagnosis not present

## 2021-09-14 LAB — GLUCOSE, CAPILLARY
Glucose-Capillary: 100 mg/dL — ABNORMAL HIGH (ref 70–99)
Glucose-Capillary: 106 mg/dL — ABNORMAL HIGH (ref 70–99)
Glucose-Capillary: 106 mg/dL — ABNORMAL HIGH (ref 70–99)
Glucose-Capillary: 111 mg/dL — ABNORMAL HIGH (ref 70–99)
Glucose-Capillary: 111 mg/dL — ABNORMAL HIGH (ref 70–99)
Glucose-Capillary: 117 mg/dL — ABNORMAL HIGH (ref 70–99)
Glucose-Capillary: 122 mg/dL — ABNORMAL HIGH (ref 70–99)
Glucose-Capillary: 123 mg/dL — ABNORMAL HIGH (ref 70–99)
Glucose-Capillary: 129 mg/dL — ABNORMAL HIGH (ref 70–99)
Glucose-Capillary: 133 mg/dL — ABNORMAL HIGH (ref 70–99)
Glucose-Capillary: 75 mg/dL (ref 70–99)
Glucose-Capillary: 83 mg/dL (ref 70–99)
Glucose-Capillary: 85 mg/dL (ref 70–99)
Glucose-Capillary: 88 mg/dL (ref 70–99)
Glucose-Capillary: 88 mg/dL (ref 70–99)
Glucose-Capillary: 92 mg/dL (ref 70–99)
Glucose-Capillary: 97 mg/dL (ref 70–99)
Glucose-Capillary: 99 mg/dL (ref 70–99)

## 2021-09-14 LAB — BASIC METABOLIC PANEL
Anion gap: <3 — ABNORMAL LOW (ref 5–15)
BUN: 8 mg/dL (ref 6–20)
CO2: 29 mmol/L (ref 22–32)
Calcium: 8.6 mg/dL — ABNORMAL LOW (ref 8.9–10.3)
Chloride: 108 mmol/L (ref 98–111)
Creatinine, Ser: 0.78 mg/dL (ref 0.44–1.00)
GFR, Estimated: >60 mL/min (ref >60)
Glucose, Bld: 107 mg/dL — ABNORMAL HIGH (ref 70–99)
Potassium: 4.3 mmol/L (ref 3.5–5.1)
Sodium: 139 mmol/L (ref 135–145)

## 2021-09-14 NOTE — Progress Notes (Signed)
PROGRESS NOTE    Tricia Brown  VEH:[id number] DOB: 06/25/1976 DOA: 09/11/2021 [facility]CP: [facility]   Brief Narrative: Chance Karam is a 45 y.o. female with medical history significant of chronic pancreatitis, recent acute hepatitis, depression, opiate use disorder, migraine headaches. Patient presented with altered mental status possibly secondary to profound hypoglycemia. Mental status improved slowly after correction of blood sugar. High dose thiamine initiated but patient with significant improvement of mental status back to baseline with one dose. Patient continues to have hypoglycemia requiring continuous D10 IVF   Assessment & Plan:   Acute metabolic encephalopathy Resolved Unclear etiology, possibly related to hypoglycemia Patient has a prior concern for ??Wernicke's encephalopathy Patient with recent acute hepatitis which appears to be resolved currently, no history of cirrhosis or ascites Urine drug screen significant for THC CT head was negative No obvious source of infection; urinalysis unremarkable  Hypoglycemia without diagnosis of diabetes mellitus Unsure of etiology Per patient, she has only taken her prescribed medications Patient continues to have hypoglycemia while on D10 1/2 NS IV fluids Continue CBG q1 hour while on D10 IV fluids C-peptide mildly elevated at 4.7, beta-hydroxybutyric acid WNL, insulin 30.5 elevated proinsulin/insulin ratio pending, sulfonylurea hypoglycemics panel to rule out possible accidental medication effect Vs unlikely insulinoma  Migraine Resume home triptan prn Ibuprofen prn  Chronic pancreatitis (HCC)- (present on admission) Continue Creon  Gastroesophageal reflux disease without esophagitis- (present on admission) Continue Protonix and Carafate  Alcohol use disorder in remission- (present on admission) Alcohol level was undetectable, reported quit drinking alcohol Currently no signs of alcohol withdrawal  Opioid use  disorder, severe, dependence (HCC)- (present on admission) Continue Suboxone 3 times daily    DVT prophylaxis: Lovenox Code Status:   Code Status: Full Code Family Communication: None at bedside Disposition Plan: Discharge home pending resolution of hypoglycemia  Consultants:  None  Procedures:  None  Antimicrobials: None    Subjective: Patient denies any new complaints.  CBGs appear to be somewhat improving.   Objective: Vitals:   09/14/21 1200 09/14/21 1202 09/14/21 1300 09/14/21 1400  BP: (!) 91/48 (!) 91/48 114/72 128/64  Pulse: 80 (!) 51 84 88  Resp: 12 13 20  (!) 22  Temp:  98.1 F (36.7 C)    TempSrc:  Oral    SpO2: 95% 99% 100% 100%  Weight:      Height:        Intake/Output Summary (Last 24 hours) at 09/14/2021 1456 Last data filed at 09/14/2021 1229 Gross per 24 hour  Intake 1698.34 ml  Output 2975 ml  Net -1276.66 ml   Filed Weights   09/11/21 1053 09/11/21 2015 09/12/21 0310  Weight: 68 kg 71.5 kg 69.4 kg    Examination: General: NAD  Cardiovascular: S1, S2 present Respiratory: CTAB Abdomen: Soft, nontender, nondistended, bowel sounds present Musculoskeletal: No bilateral pedal edema noted Skin: Normal Psychiatry: Normal mood      Data Reviewed: I have personally reviewed following labs and imaging studies  CBC Lab Results  Component Value Date   WBC 5.8 09/12/2021   RBC 4.32 09/12/2021   HGB 12.7 09/12/2021   HCT 39.2 09/12/2021   MCV 90.7 09/12/2021   MCH 29.4 09/12/2021   PLT 218 09/12/2021   MCHC 32.4 09/12/2021   RDW 14.3 09/12/2021   LYMPHSABS 1.0 09/11/2021   MONOABS 0.6 09/11/2021   EOSABS 0.1 09/11/2021   BASOSABS 0.1 09/11/2021     Last metabolic panel Lab Results  Component Value Date   NA 139  09/14/2021   K 4.3 09/14/2021   CL 108 09/14/2021   CO2 29 09/14/2021   BUN 8 09/14/2021   CREATININE 0.78 09/14/2021   GLUCOSE 107 (H) 09/14/2021   GFRNONAA >60 09/14/2021   GFRAA >60 12/18/2019   CALCIUM 8.6  (L) 09/14/2021   PHOS 4.9 (H) 10/07/2020   PROT 6.7 09/12/2021   ALBUMIN 3.7 09/12/2021   BILITOT 1.0 09/12/2021   ALKPHOS 117 09/12/2021   AST 31 09/12/2021   ALT 66 (H) 09/12/2021   ANIONGAP <3 (L) 09/14/2021    CBG (last 3)  Recent Labs    09/14/21 1225 09/14/21 1327 09/14/21 1432  GLUCAP 100* 133* 85     GFR: Estimated Creatinine Clearance: 86.4 mL/min (by C-G formula based on SCr of 0.78 mg/dL).  Coagulation Profile: Recent Labs  Lab 09/11/21 1224 09/12/21 0330  INR 1.4* 1.0    Recent Results (from the past 240 hour(s))  Resp Panel by RT-PCR (Flu A&B, Covid) Urine, Clean Catch     Status: None   Collection Time: 09/11/21  3:53 PM   Specimen: Urine, Clean Catch; Nasopharyngeal(NP) swabs in vial transport medium  Result Value Ref Range Status   SARS Coronavirus 2 by RT PCR NEGATIVE NEGATIVE Final    Comment: (NOTE) SARS-CoV-2 target nucleic acids are NOT DETECTED.  The SARS-CoV-2 RNA is generally detectable in upper respiratory specimens during the acute phase of infection. The lowest concentration of SARS-CoV-2 viral copies this assay can detect is 138 copies/mL. A negative result does not preclude SARS-Cov-2 infection and should not be used as the sole basis for treatment or other patient management decisions. A negative result may occur with  improper specimen collection/handling, submission of specimen other than nasopharyngeal swab, presence of viral mutation(s) within the areas targeted by this assay, and inadequate number of viral copies(<138 copies/mL). A negative result must be combined with clinical observations, patient history, and epidemiological information. The expected result is Negative.  Fact Sheet for Patients:  BloggerCourse.com  Fact Sheet for Healthcare Providers:  SeriousBroker.it  This test is no t yet approved or cleared by the [place] FDA and  has been authorized for  detection and/or diagnosis of SARS-CoV-2 by FDA under an Emergency Use Authorization (EUA). This EUA will remain  in effect (meaning this test can be used) for the duration of the COVID-19 declaration under Section 564(b)(1) of the Act, 21 U.S.C.section 360bbb-3(b)(1), unless the authorization is terminated  or revoked sooner.       Influenza A by PCR NEGATIVE NEGATIVE Final   Influenza B by PCR NEGATIVE NEGATIVE Final    Comment: (NOTE) The Xpert Xpress SARS-CoV-2/FLU/RSV plus assay is intended as an aid in the diagnosis of influenza from Nasopharyngeal swab specimens and should not be used as a sole basis for treatment. Nasal washings and aspirates are unacceptable for Xpert Xpress SARS-CoV-2/FLU/RSV testing.  Fact Sheet for Patients: BloggerCourse.com  Fact Sheet for Healthcare Providers: SeriousBroker.it  This test is not yet approved or cleared by the [place] FDA and has been authorized for detection and/or diagnosis of SARS-CoV-2 by FDA under an Emergency Use Authorization (EUA). This EUA will remain in effect (meaning this test can be used) for the duration of the COVID-19 declaration under Section 564(b)(1) of the Act, 21 U.S.C. section 360bbb-3(b)(1), unless the authorization is terminated or revoked.  Performed at [facility], [address] [place], [place] [address]   MRSA Next Gen by PCR, Nasal     Status: None  Collection Time: 09/11/21  8:33 PM   Specimen: Nasal Mucosa; Nasal Swab  Result Value Ref Range Status   MRSA by PCR Next Gen NOT DETECTED NOT DETECTED Final    Comment: (NOTE) The GeneXpert MRSA Assay (FDA approved for NASAL specimens only), is one component of a comprehensive MRSA colonization surveillance program. It is not intended to diagnose MRSA infection nor to guide or monitor treatment for MRSA infections. Test performance is not FDA approved in patients less than 1  years old. Performed at [facility], [address] [place], [place] [address]         Radiology Studies: No results found.      Scheduled Meds:  buprenorphine-naloxone  1 tablet Sublingual TID   Chlorhexidine Gluconate Cloth  6 each Topical Daily   DULoxetine  60 mg Oral Daily   enoxaparin (LOVENOX) injection  40 mg Subcutaneous Q24H   feeding supplement  1 Container Oral TID BM   ferrous sulfate  324 mg Oral Q breakfast   fluticasone furoate-vilanterol  1 puff Inhalation Daily   gabapentin  300 mg Oral TID   lipase/protease/amylase  108,000 Units Oral TID WC   multivitamin with minerals  1 tablet Oral Daily   nicotine  14 mg Transdermal Daily   pantoprazole  40 mg Oral Daily   sucralfate  1 g Oral TID AC   thiamine  100 mg Oral Daily   Continuous Infusions:  dextrose 10 % and 0.45 % NaCl 100 mL/hr at 09/14/21 1100     LOS: 2 days     Briant Cedar, MD [facility] 09/14/2021, 2:56 PM  If 7PM-7AM, please contact night-coverage [email or web address]

## 2021-09-15 DIAGNOSIS — K861 Other chronic pancreatitis: Secondary | ICD-10-CM | POA: Diagnosis not present

## 2021-09-15 DIAGNOSIS — F1091 Alcohol use, unspecified, in remission: Secondary | ICD-10-CM | POA: Diagnosis not present

## 2021-09-15 DIAGNOSIS — E162 Hypoglycemia, unspecified: Secondary | ICD-10-CM | POA: Diagnosis not present

## 2021-09-15 DIAGNOSIS — G9341 Metabolic encephalopathy: Secondary | ICD-10-CM | POA: Diagnosis not present

## 2021-09-15 LAB — BASIC METABOLIC PANEL
Anion gap: 6 (ref 5–15)
BUN: 10 mg/dL (ref 6–20)
CO2: 30 mmol/L (ref 22–32)
Calcium: 9.2 mg/dL (ref 8.9–10.3)
Chloride: 104 mmol/L (ref 98–111)
Creatinine, Ser: 0.69 mg/dL (ref 0.44–1.00)
GFR, Estimated: >60 mL/min (ref >60)
Glucose, Bld: 95 mg/dL (ref 70–99)
Potassium: 4.3 mmol/L (ref 3.5–5.1)
Sodium: 140 mmol/L (ref 135–145)

## 2021-09-15 LAB — URINALYSIS, ROUTINE W REFLEX MICROSCOPIC
Bilirubin Urine: NEGATIVE
Glucose, UA: NEGATIVE mg/dL
Hgb urine dipstick: NEGATIVE
Ketones, ur: NEGATIVE mg/dL
Leukocytes,Ua: NEGATIVE
Nitrite: NEGATIVE
Protein, ur: NEGATIVE mg/dL
Specific Gravity, Urine: <1.005 — ABNORMAL LOW (ref 1.005–1.030)
pH: 6.5 (ref 5.0–8.0)

## 2021-09-15 LAB — GLUCOSE, CAPILLARY
Glucose-Capillary: 102 mg/dL — ABNORMAL HIGH (ref 70–99)
Glucose-Capillary: 104 mg/dL — ABNORMAL HIGH (ref 70–99)
Glucose-Capillary: 107 mg/dL — ABNORMAL HIGH (ref 70–99)
Glucose-Capillary: 109 mg/dL — ABNORMAL HIGH (ref 70–99)
Glucose-Capillary: 110 mg/dL — ABNORMAL HIGH (ref 70–99)
Glucose-Capillary: 116 mg/dL — ABNORMAL HIGH (ref 70–99)
Glucose-Capillary: 118 mg/dL — ABNORMAL HIGH (ref 70–99)
Glucose-Capillary: 82 mg/dL (ref 70–99)
Glucose-Capillary: 85 mg/dL (ref 70–99)
Glucose-Capillary: 91 mg/dL (ref 70–99)
Glucose-Capillary: 94 mg/dL (ref 70–99)
Glucose-Capillary: 96 mg/dL (ref 70–99)

## 2021-09-15 NOTE — Progress Notes (Signed)
PROGRESS NOTE    Tricia Brown  Tricia Brown:[id number] DOB: 11-Apr-1976 DOA: 09/11/2021 PCP: [facility]   Brief Narrative: Tricia Brown is a 45 y.o. female with medical history significant of chronic pancreatitis, recent acute hepatitis, depression, opiate use disorder, migraine headaches. Patient presented with altered mental status possibly secondary to profound hypoglycemia. Mental status improved slowly after correction of blood sugar. High dose thiamine initiated but patient with significant improvement of mental status back to baseline with one dose. Patient continues to have hypoglycemia requiring continuous D10 IVF   Assessment & Plan:   Acute metabolic encephalopathy Resolved Unclear etiology, possibly related to hypoglycemia Patient has a prior concern for ??Wernicke's encephalopathy Patient with recent acute hepatitis which appears to be resolved currently, no history of cirrhosis or ascites Urine drug screen significant for THC CT head was negative No obvious source of infection; urinalysis unremarkable  Hypoglycemia without diagnosis of diabetes mellitus No further hypoglycemic events on D10 Unsure of etiology Per patient, she has only taken her prescribed medications Continue CBG q2 hour and reduce rate to 50 cc/hr D10 IV fluids C-peptide mildly elevated at 4.7, beta-hydroxybutyric acid WNL, insulin 30.5 elevated proinsulin/insulin ratio pending, sulfonylurea hypoglycemics panel to rule out possible accidental medication effect Vs unlikely insulinoma  Migraine Resume home triptan prn Ibuprofen prn  Chronic pancreatitis (HCC)- (present on admission) Continue Creon  Gastroesophageal reflux disease without esophagitis- (present on admission) Continue Protonix and Carafate  Alcohol use disorder in remission- (present on admission) Alcohol level was undetectable, reported quit drinking alcohol Currently no signs of alcohol withdrawal  Opioid use disorder,  severe, dependence (HCC)- (present on admission) Continue Suboxone 3 times daily    DVT prophylaxis: Lovenox Code Status:   Code Status: Full Code Family Communication: None at bedside Disposition Plan: Discharge home pending resolution of hypoglycemia  Consultants:  None  Procedures:  None  Antimicrobials: None    Subjective: Patient denies any new complaints.  CBGs seem to be improving slowly.   Objective: Vitals:   09/15/21 1200 09/15/21 1300 09/15/21 1500 09/15/21 1600  BP: (!) 105/55   106/68  Pulse: 73 70 71 86  Resp: 13 13 12 17   Temp: 98.2 F (36.8 C)   98.6 F (37 C)  TempSrc: Oral   Oral  SpO2: 97% 100% 96% 99%  Weight:      Height:        Intake/Output Summary (Last 24 hours) at 09/15/2021 1708 Last data filed at 09/15/2021 0934 Gross per 24 hour  Intake 1948.7 ml  Output 450 ml  Net 1498.7 ml   Filed Weights   09/11/21 1053 09/11/21 2015 09/12/21 0310  Weight: 68 kg 71.5 kg 69.4 kg    Examination: General: NAD  Cardiovascular: S1, S2 present Respiratory: CTAB Abdomen: Soft, nontender, nondistended, bowel sounds present Musculoskeletal: No bilateral pedal edema noted Skin: Normal Psychiatry: Normal mood      Data Reviewed: I have personally reviewed following labs and imaging studies  CBC Lab Results  Component Value Date   WBC 5.8 09/12/2021   RBC 4.32 09/12/2021   HGB 12.7 09/12/2021   HCT 39.2 09/12/2021   MCV 90.7 09/12/2021   MCH 29.4 09/12/2021   PLT 218 09/12/2021   MCHC 32.4 09/12/2021   RDW 14.3 09/12/2021   LYMPHSABS 1.0 09/11/2021   MONOABS 0.6 09/11/2021   EOSABS 0.1 09/11/2021   BASOSABS 0.1 09/11/2021     Last metabolic panel Lab Results  Component Value Date   NA 140 09/15/2021  K 4.3 09/15/2021   CL 104 09/15/2021   CO2 30 09/15/2021   BUN 10 09/15/2021   CREATININE 0.69 09/15/2021   GLUCOSE 95 09/15/2021   GFRNONAA >60 09/15/2021   GFRAA >60 12/18/2019   CALCIUM 9.2 09/15/2021   PHOS 4.9 (H)  10/07/2020   PROT 6.7 09/12/2021   ALBUMIN 3.7 09/12/2021   BILITOT 1.0 09/12/2021   ALKPHOS 117 09/12/2021   AST 31 09/12/2021   ALT 66 (H) 09/12/2021   ANIONGAP 6 09/15/2021    CBG (last 3)  Recent Labs    09/15/21 1134 09/15/21 1326 09/15/21 1532  GLUCAP 109* 118* 116*     GFR: Estimated Creatinine Clearance: 86.4 mL/min (by C-G formula based on SCr of 0.69 mg/dL).  Coagulation Profile: Recent Labs  Lab 09/11/21 1224 09/12/21 0330  INR 1.4* 1.0    Recent Results (from the past 240 hour(s))  Resp Panel by RT-PCR (Flu A&B, Covid) Urine, Clean Catch     Status: None   Collection Time: 09/11/21  3:53 PM   Specimen: Urine, Clean Catch; Nasopharyngeal(NP) swabs in vial transport medium  Result Value Ref Range Status   SARS Coronavirus 2 by RT PCR NEGATIVE NEGATIVE Final    Comment: (NOTE) SARS-CoV-2 target nucleic acids are NOT DETECTED.  The SARS-CoV-2 RNA is generally detectable in upper respiratory specimens during the acute phase of infection. The lowest concentration of SARS-CoV-2 viral copies this assay can detect is 138 copies/mL. A negative result does not preclude SARS-Cov-2 infection and should not be used as the sole basis for treatment or other patient management decisions. A negative result may occur with  improper specimen collection/handling, submission of specimen other than nasopharyngeal swab, presence of viral mutation(s) within the areas targeted by this assay, and inadequate number of viral copies(<138 copies/mL). A negative result must be combined with clinical observations, patient history, and epidemiological information. The expected result is Negative.  Fact Sheet for Patients:  BloggerCourse.com  Fact Sheet for Healthcare Providers:  SeriousBroker.it  This test is no t yet approved or cleared by the [place] FDA and  has been authorized for detection and/or diagnosis of SARS-CoV-2  by FDA under an Emergency Use Authorization (EUA). This EUA will remain  in effect (meaning this test can be used) for the duration of the COVID-19 declaration under Section 564(b)(1) of the Act, 21 U.S.C.section 360bbb-3(b)(1), unless the authorization is terminated  or revoked sooner.       Influenza A by PCR NEGATIVE NEGATIVE Final   Influenza B by PCR NEGATIVE NEGATIVE Final    Comment: (NOTE) The Xpert Xpress SARS-CoV-2/FLU/RSV plus assay is intended as an aid in the diagnosis of influenza from Nasopharyngeal swab specimens and should not be used as a sole basis for treatment. Nasal washings and aspirates are unacceptable for Xpert Xpress SARS-CoV-2/FLU/RSV testing.  Fact Sheet for Patients: BloggerCourse.com  Fact Sheet for Healthcare Providers: SeriousBroker.it  This test is not yet approved or cleared by the [place] FDA and has been authorized for detection and/or diagnosis of SARS-CoV-2 by FDA under an Emergency Use Authorization (EUA). This EUA will remain in effect (meaning this test can be used) for the duration of the COVID-19 declaration under Section 564(b)(1) of the Act, 21 U.S.C. section 360bbb-3(b)(1), unless the authorization is terminated or revoked.  Performed at [facility], [address], [place], [place] [address]   MRSA Next Gen by PCR, Nasal     Status: None   Collection Time: 09/11/21  8:33 PM   Specimen: Nasal Mucosa; Nasal Swab  Result Value Ref Range Status   MRSA by PCR Next Gen NOT DETECTED NOT DETECTED Final    Comment: (NOTE) The GeneXpert MRSA Assay (FDA approved for NASAL specimens only), is one component of a comprehensive MRSA colonization surveillance program. It is not intended to diagnose MRSA infection nor to guide or monitor treatment for MRSA infections. Test performance is not FDA approved in patients less than 109 years old. Performed at [facility], [address], [place], [place] [address]         Radiology Studies: No results found.      Scheduled Meds:  buprenorphine-naloxone  1 tablet Sublingual TID   Chlorhexidine Gluconate Cloth  6 each Topical Daily   DULoxetine  60 mg Oral Daily   enoxaparin (LOVENOX) injection  40 mg Subcutaneous Q24H   feeding supplement  1 Container Oral TID BM   ferrous sulfate  324 mg Oral Q breakfast   fluticasone furoate-vilanterol  1 puff Inhalation Daily   gabapentin  300 mg Oral TID   lipase/protease/amylase  108,000 Units Oral TID WC   multivitamin with minerals  1 tablet Oral Daily   nicotine  14 mg Transdermal Daily   pantoprazole  40 mg Oral Daily   sucralfate  1 g Oral TID AC   thiamine  100 mg Oral Daily   Continuous Infusions:  dextrose 10 % and 0.45 % NaCl 50 mL/hr at 09/15/21 2353     LOS: 3 days     Briant Cedar, MD [facility] 09/15/2021, 5:08 PM  If 7PM-7AM, please contact night-coverage [email or web address]

## 2021-09-16 DIAGNOSIS — E162 Hypoglycemia, unspecified: Secondary | ICD-10-CM | POA: Diagnosis not present

## 2021-09-16 DIAGNOSIS — F1091 Alcohol use, unspecified, in remission: Secondary | ICD-10-CM | POA: Diagnosis not present

## 2021-09-16 DIAGNOSIS — G9341 Metabolic encephalopathy: Secondary | ICD-10-CM | POA: Diagnosis not present

## 2021-09-16 DIAGNOSIS — K861 Other chronic pancreatitis: Secondary | ICD-10-CM | POA: Diagnosis not present

## 2021-09-16 LAB — BASIC METABOLIC PANEL
Anion gap: 7 (ref 5–15)
BUN: 14 mg/dL (ref 6–20)
CO2: 28 mmol/L (ref 22–32)
Calcium: 8.9 mg/dL (ref 8.9–10.3)
Chloride: 102 mmol/L (ref 98–111)
Creatinine, Ser: 0.64 mg/dL (ref 0.44–1.00)
GFR, Estimated: >60 mL/min (ref >60)
Glucose, Bld: 98 mg/dL (ref 70–99)
Potassium: 3.9 mmol/L (ref 3.5–5.1)
Sodium: 137 mmol/L (ref 135–145)

## 2021-09-16 LAB — GLUCOSE, CAPILLARY
Glucose-Capillary: 110 mg/dL — ABNORMAL HIGH (ref 70–99)
Glucose-Capillary: 111 mg/dL — ABNORMAL HIGH (ref 70–99)
Glucose-Capillary: 100 mg/dL — ABNORMAL HIGH (ref 70–99)
Glucose-Capillary: 104 mg/dL — ABNORMAL HIGH (ref 70–99)
Glucose-Capillary: 89 mg/dL (ref 70–99)
Glucose-Capillary: 113 mg/dL — ABNORMAL HIGH (ref 70–99)
Glucose-Capillary: 77 mg/dL (ref 70–99)
Glucose-Capillary: 140 mg/dL — ABNORMAL HIGH (ref 70–99)
Glucose-Capillary: 91 mg/dL (ref 70–99)
Glucose-Capillary: 94 mg/dL (ref 70–99)
Glucose-Capillary: 136 mg/dL — ABNORMAL HIGH (ref 70–99)

## 2021-09-16 NOTE — Progress Notes (Addendum)
°  Transition of Care [facility]) Screening Note   Patient Details  Name: Tricia Brown Date of Birth: [birth year]   Transition of Care [facility]) CM/SW Contact:    Sharena Dibenedetto, Meriam Sprague, RN Phone Number: 09/16/2021, 2:39 PM    Transition of Care Department [facility]) has reviewed patient and no TOC needs have been identified at this time. We will continue to monitor patient advancement through interdisciplinary progression rounds. If new patient transition needs arise, please place a TOC consult.   30 Day Unplanned Readmission Risk Score    Flowsheet Row ED to Hosp-Admission (Current) from 09/11/2021 in [facility]-ICU/STEPDOWN  30 Day Unplanned Readmission Risk Score (%) 36.04 Filed at 09/16/2021 1200       This score is the patient's risk of an unplanned readmission within 30 days of being discharged (0 -100%). The score is based on dignosis, age, lab data, medications, orders, and past utilization.   Low:  0-14.9   Medium: 15-21.9   High: 22-29.9   Extreme: 30 and above         Readmission Risk Prevention Plan 09/16/2021 09/02/2021  Transportation Screening Complete Complete  Medication Review [facility]) Complete Complete  PCP or Specialist appointment within 3-5 days of discharge Complete Complete  HRI or Home Care Consult Complete Complete  SW Recovery Care/Counseling Consult Complete Complete  Palliative Care Screening Not Applicable Not Applicable  Skilled Nursing Facility Not Applicable Not Applicable  Some recent data might be hidden

## 2021-09-16 NOTE — Progress Notes (Signed)
PROGRESS NOTE    Tricia Brown  ZTI:[id number] DOB: [birth year] DOA: 09/11/2021 [facility]   Brief Narrative: Tricia Brown is a 45 y.o. female with medical history significant of chronic pancreatitis, recent acute hepatitis, depression, opiate use disorder, migraine headaches. Patient presented with altered mental status possibly secondary to profound hypoglycemia. Mental status improved slowly after correction of blood sugar. High dose thiamine initiated but patient with significant improvement of mental status back to baseline with one dose. Patient admitted for further management.   Assessment & Plan:   Acute metabolic encephalopathy Resolved Unclear etiology, possibly related to hypoglycemia Patient has a prior concern for ??Wernicke's encephalopathy, but ruled out Patient with recent acute hepatitis which appears to be resolved currently, no history of cirrhosis or ascites Urine drug screen significant for THC CT head was negative No obvious source of infection; urinalysis unremarkable  Hypoglycemia without diagnosis of diabetes mellitus No further hypoglycemic events on D10, discontinued on 09/16/21 Unsure of etiology Per patient, she has only taken her prescribed medications Discussed with Dr Sharl Ma with endocrinology on 09/16/21, continue CBG q2 hour for today and monitor glucose closely, if CBG is <60, verify with stat BMP and also order stat c-peptide, insulin, proinsulin at the same time during the hypoglycemic event before administering anything orally or IV If patient CBGs remain stable throughout 09/16/21, may be a transient issue and may consider discharge to follow up with PCP Initial lab work: C-peptide mildly elevated at 4.7, beta-hydroxybutyric acid WNL, insulin 30.5 elevated, proinsulin/insulin ratio pending, sulfonylurea hypoglycemics panel still pending to rule out possible accidental medication effect Vs unlikely insulinoma PCP to follow  up  Migraine Resume home triptan prn Ibuprofen prn  Chronic pancreatitis (HCC)- (present on admission) Continue Creon  Gastroesophageal reflux disease without esophagitis- (present on admission) Continue Protonix and Carafate  Alcohol use disorder in remission- (present on admission) Alcohol level was undetectable, reported quit drinking alcohol Currently no signs of alcohol withdrawal  Opioid use disorder, severe, dependence (HCC)- (present on admission) Continue Suboxone 3 times daily    DVT prophylaxis: Lovenox Code Status:   Code Status: Full Code Family Communication: None at bedside Disposition Plan: Discharge home pending resolution of hypoglycemia  Consultants:  Discussed with Dr Sharl Ma Endocrinologist on 09/16/21  Procedures:  None  Antimicrobials: None    Subjective: Patient denies any new complaints.   Objective: Vitals:   09/16/21 0800 09/16/21 0912 09/16/21 1000 09/16/21 1100  BP:   117/72   Pulse:   78 73  Resp:   18 19  Temp: 98.2 F (36.8 C)     TempSrc: Oral     SpO2:  100% 99% 98%  Weight:      Height:        Intake/Output Summary (Last 24 hours) at 09/16/2021 1141 Last data filed at 09/16/2021 0945 Gross per 24 hour  Intake 2789.01 ml  Output 1700 ml  Net 1089.01 ml   Filed Weights   09/11/21 1053 09/11/21 2015 09/12/21 0310  Weight: 68 kg 71.5 kg 69.4 kg    Examination: General: NAD  Cardiovascular: S1, S2 present Respiratory: CTAB Abdomen: Soft, nontender, nondistended, bowel sounds present Musculoskeletal: No bilateral pedal edema noted Skin: Normal Psychiatry: Normal mood      Data Reviewed: I have personally reviewed following labs and imaging studies  CBC Lab Results  Component Value Date   WBC 5.8 09/12/2021   RBC 4.32 09/12/2021   HGB 12.7 09/12/2021   HCT 39.2 09/12/2021   MCV  90.7 09/12/2021   MCH 29.4 09/12/2021   PLT 218 09/12/2021   MCHC 32.4 09/12/2021   RDW 14.3 09/12/2021   LYMPHSABS 1.0  09/11/2021   MONOABS 0.6 09/11/2021   EOSABS 0.1 09/11/2021   BASOSABS 0.1 09/11/2021     Last metabolic panel Lab Results  Component Value Date   NA 137 09/16/2021   K 3.9 09/16/2021   CL 102 09/16/2021   CO2 28 09/16/2021   BUN 14 09/16/2021   CREATININE 0.64 09/16/2021   GLUCOSE 98 09/16/2021   GFRNONAA >60 09/16/2021   GFRAA >60 12/18/2019   CALCIUM 8.9 09/16/2021   PHOS 4.9 (H) 10/07/2020   PROT 6.7 09/12/2021   ALBUMIN 3.7 09/12/2021   BILITOT 1.0 09/12/2021   ALKPHOS 117 09/12/2021   AST 31 09/12/2021   ALT 66 (H) 09/12/2021   ANIONGAP 7 09/16/2021    CBG (last 3)  Recent Labs    09/16/21 0637 09/16/21 0729 09/16/21 0943  GLUCAP 104* 100* 89     GFR: Estimated Creatinine Clearance: 86.4 mL/min (by C-G formula based on SCr of 0.64 mg/dL).  Coagulation Profile: Recent Labs  Lab 09/11/21 1224 09/12/21 0330  INR 1.4* 1.0    Recent Results (from the past 240 hour(s))  Resp Panel by RT-PCR (Flu A&B, Covid) Urine, Clean Catch     Status: None   Collection Time: 09/11/21  3:53 PM   Specimen: Urine, Clean Catch; Nasopharyngeal(NP) swabs in vial transport medium  Result Value Ref Range Status   SARS Coronavirus 2 by RT PCR NEGATIVE NEGATIVE Final    Comment: (NOTE) SARS-CoV-2 target nucleic acids are NOT DETECTED.  The SARS-CoV-2 RNA is generally detectable in upper respiratory specimens during the acute phase of infection. The lowest concentration of SARS-CoV-2 viral copies this assay can detect is 138 copies/mL. A negative result does not preclude SARS-Cov-2 infection and should not be used as the sole basis for treatment or other patient management decisions. A negative result may occur with  improper specimen collection/handling, submission of specimen other than nasopharyngeal swab, presence of viral mutation(s) within the areas targeted by this assay, and inadequate number of viral copies(<138 copies/mL). A negative result must be combined  with clinical observations, patient history, and epidemiological information. The expected result is Negative.  Fact Sheet for Patients:  BloggerCourse.com  Fact Sheet for Healthcare Providers:  SeriousBroker.it  This test is no t yet approved or cleared by the [place] FDA and  has been authorized for detection and/or diagnosis of SARS-CoV-2 by FDA under an Emergency Use Authorization (EUA). This EUA will remain  in effect (meaning this test can be used) for the duration of the COVID-19 declaration under Section 564(b)(1) of the Act, 21 U.S.C.section 360bbb-3(b)(1), unless the authorization is terminated  or revoked sooner.       Influenza A by PCR NEGATIVE NEGATIVE Final   Influenza B by PCR NEGATIVE NEGATIVE Final    Comment: (NOTE) The Xpert Xpress SARS-CoV-2/FLU/RSV plus assay is intended as an aid in the diagnosis of influenza from Nasopharyngeal swab specimens and should not be used as a sole basis for treatment. Nasal washings and aspirates are unacceptable for Xpert Xpress SARS-CoV-2/FLU/RSV testing.  Fact Sheet for Patients: BloggerCourse.com  Fact Sheet for Healthcare Providers: SeriousBroker.it  This test is not yet approved or cleared by the [place] FDA and has been authorized for detection and/or diagnosis of SARS-CoV-2 by FDA under an Emergency Use Authorization (EUA). This EUA will remain in effect (meaning this  test can be used) for the duration of the COVID-19 declaration under Section 564(b)(1) of the Act, 21 U.S.C. section 360bbb-3(b)(1), unless the authorization is terminated or revoked.  Performed at [facility], [address], [place], [place] [address]   MRSA Next Gen by PCR, Nasal     Status: None   Collection Time: 09/11/21  8:33 PM   Specimen: Nasal Mucosa; Nasal Swab  Result Value Ref Range Status   MRSA by PCR  Next Gen NOT DETECTED NOT DETECTED Final    Comment: (NOTE) The GeneXpert MRSA Assay (FDA approved for NASAL specimens only), is one component of a comprehensive MRSA colonization surveillance program. It is not intended to diagnose MRSA infection nor to guide or monitor treatment for MRSA infections. Test performance is not FDA approved in patients less than 44 years old. Performed at [facility], [address], [place], [place] [address]         Radiology Studies: No results found.      Scheduled Meds:  buprenorphine-naloxone  1 tablet Sublingual TID   Chlorhexidine Gluconate Cloth  6 each Topical Daily   DULoxetine  60 mg Oral Daily   enoxaparin (LOVENOX) injection  40 mg Subcutaneous Q24H   feeding supplement  1 Container Oral TID BM   ferrous sulfate  324 mg Oral Q breakfast   fluticasone furoate-vilanterol  1 puff Inhalation Daily   gabapentin  300 mg Oral TID   lipase/protease/amylase  108,000 Units Oral TID WC   multivitamin with minerals  1 tablet Oral Daily   nicotine  14 mg Transdermal Daily   pantoprazole  40 mg Oral Daily   sucralfate  1 g Oral TID AC   thiamine  100 mg Oral Daily   Continuous Infusions:     LOS: 4 days     Briant Cedar, MD [facility] 09/16/2021, 11:41 AM  If 7PM-7AM, please contact night-coverage [email or web address]

## 2021-09-17 DIAGNOSIS — E162 Hypoglycemia, unspecified: Secondary | ICD-10-CM | POA: Diagnosis not present

## 2021-09-17 LAB — GLUCOSE, CAPILLARY
Glucose-Capillary: 101 mg/dL — ABNORMAL HIGH (ref 70–99)
Glucose-Capillary: 103 mg/dL — ABNORMAL HIGH (ref 70–99)
Glucose-Capillary: 113 mg/dL — ABNORMAL HIGH (ref 70–99)
Glucose-Capillary: 84 mg/dL (ref 70–99)
Glucose-Capillary: 92 mg/dL (ref 70–99)

## 2021-09-17 LAB — BASIC METABOLIC PANEL
Anion gap: 5 (ref 5–15)
BUN: 16 mg/dL (ref 6–20)
CO2: 29 mmol/L (ref 22–32)
Calcium: 9.1 mg/dL (ref 8.9–10.3)
Chloride: 104 mmol/L (ref 98–111)
Creatinine, Ser: 0.83 mg/dL (ref 0.44–1.00)
GFR, Estimated: >60 mL/min (ref >60)
Glucose, Bld: 96 mg/dL (ref 70–99)
Potassium: 3.6 mmol/L (ref 3.5–5.1)
Sodium: 138 mmol/L (ref 135–145)

## 2021-09-17 NOTE — Discharge Summary (Signed)
Physician Discharge Summary  Tricia Brown YDX:[id number] DOB: 08-20-1976 DOA: 09/11/2021  PCP: [facility]  Admit date: 09/11/2021 Discharge date: 09/17/2021  Admitted From: home Disposition:  home  Recommendations for Outpatient Follow-up:  Follow up with PCP and endocrinology in 1-2 weeks Please obtain BMP/CBC in one week Please follow up on the following pending results: hypoglycemia work up labs   Home Health:no  Equipment/Devices: none  Discharge Condition: Stable Code Status:   Code Status: Full Code Diet recommendation:  Diet Order             Diet 2 gram sodium Room service appropriate? Yes; Fluid consistency: Thin  Diet effective now                 Brief/Interim Summary: 45 y.o. female with medical history significant of chronic pancreatitis, recent acute hepatitis, depression, opiate use disorder, migraine headaches. Patient presented with altered mental status possibly secondary to profound hypoglycemia. Mental status improved slowly after correction of blood sugar. High dose thiamine initiated but patient with significant improvement of mental status back to baseline with one dose. Patient admitted for further management. Acute metabolic encephalopathy likely from hypoglycemia resolved, she is now alert awake oriented x3, no more recurrence of hypoglycemia D10 has been discontinued 12/20 7 AM, did well next 24 hours.  Seen in the morning requesting for discharge home.  No new complaints.  Discharge Diagnoses:  Acute metabolic encephalopathy: At this time resolved.  AAOx4. Unclear etiology, possibly related to hypoglycemia Patient has a prior concern for ??Wernicke's encephalopathy, but ruled out Patient with recent acute hepatitis which appears to be resolved currently, no history of cirrhosis or ascites Urine drug screen significant for THC CT head was negative. No obvious source of infection; urinalysis unremarkable   Hypoglycemia without diagnosis  of diabetes mellitus No further hypoglycemic events on D10, discontinued on 09/16/21 Unsure of etiology Per patient, she has only taken her prescribed medications Discussed with Dr Sharl Ma with endocrinology on 09/16/21, continue CBG q2 hour for today and monitor glucose closely, if CBG is <60, verify with stat BMP and also order stat c-peptide, insulin, proinsulin at the same time during the hypoglycemic event before administering anything orally or IV Her CBC has remained stable throughout. Initial lab work: C-peptide mildly elevated at 4.7, beta-hydroxybutyric acid WNL, insulin 30.5 elevated, proinsulin/insulin ratio pending, sulfonylurea hypoglycemics panel still pending to rule out possible accidental medication effect Vs unlikely insulinoma. PCP to follow up the labs -patient was instructed specifically on need for follow-up on these labs.  Also provided Dr. Daune Perch numbers for follow-up   Migraine: Stable continue home triptan  Chronic pancreatitis:continue Creon GERD-cont her Protonix and Carafate Alcohol use disorder in remission- alcohol level was undetectable, reported quit drinking alcoholCurrently no signs of alcohol withdrawal Opioid use disorder, severe, dependence: Continue Suboxone 3 times daily    Consults: Endocrine  Subjective: Alert awake oriented this morning.  Requesting for discharge home.  Blood sugar has been stable.  Tolerating diet. Discharge Exam: Vitals:   09/17/21 0900 09/17/21 1000  BP:  112/66  Pulse: 82 80  Resp: 16 14  Temp:    SpO2: 100% 100%   General: Pt is alert, awake, not in acute distress Cardiovascular: RRR, S1/S2 +, no rubs, no gallops Respiratory: CTA bilaterally, no wheezing, no rhonchi Abdominal: Soft, NT, ND, bowel sounds + Extremities: no edema, no cyanosis  Discharge Instructions  Discharge Instructions     Discharge instructions   Complete by: As directed  Follow up with pcp and endocrinology I na week  Please call call MD or  return to ER for similar or worsening recurring problem that brought you to hospital or if any fever,nausea/vomiting,abdominal pain, uncontrolled pain, chest pain,  shortness of breath or any other alarming symptoms.  Please follow-up your doctor as instructed in a week time and call the office for appointment.  Please avoid alcohol, smoking, or any other illicit substance and maintain healthy habits including taking your regular medications as prescribed.  You were cared for by a hospitalist during your hospital stay. If you have any questions about your discharge medications or the care you received while you were in the hospital after you are discharged, you can call the unit and ask to speak with the hospitalist on call if the hospitalist that took care of you is not available.  Once you are discharged, your primary care physician will handle any further medical issues. Please note that NO REFILLS for any discharge medications will be authorized once you are discharged, as it is imperative that you return to your primary care physician (or establish a relationship with a primary care physician if you do not have one) for your aftercare needs so that they can reassess your need for medications and monitor your lab values   Increase activity slowly   Complete by: As directed       Allergies as of 09/17/2021   No Known Allergies      Medication List     TAKE these medications    budesonide-formoterol 80-4.5 MCG/ACT inhaler Commonly known as: SYMBICORT Inhale 2 puffs into the lungs in the morning and at bedtime.   DULoxetine 60 MG capsule Commonly known as: CYMBALTA Take 60 mg by mouth daily.   Emgality 120 MG/ML Soaj Generic drug: Galcanezumab-gnlm Inject 120 mLs into the skin every 30 (thirty) days.   ferrous sulfate 324 MG Tbec Take 324 mg by mouth daily with breakfast.   fluorometholone 0.1 % ophthalmic suspension Commonly known as: FML Place 1 drop into both eyes 2 (two)  times daily.   gabapentin 300 MG capsule Commonly known as: NEURONTIN Take 2 capsules (600 mg total) by mouth 3 (three) times daily. What changed: how much to take   lipase/protease/amylase 28413 UNITS Cpep capsule Commonly known as: CREON Take 36,000 Units by mouth See admin instructions. 108000 units oral with meals 24401 units with snacks   multivitamin with minerals Tabs tablet Take 1 tablet by mouth daily.   ondansetron 4 MG tablet Commonly known as: ZOFRAN Take 1 tablet (4 mg total) by mouth every 8 (eight) hours as needed for nausea or vomiting.   pantoprazole 40 MG tablet Commonly known as: Protonix Take 1 tablet (40 mg total) by mouth daily.   ProAir HFA 108 (90 Base) MCG/ACT inhaler Generic drug: albuterol Inhale 2 puffs into the lungs every 6 (six) hours as needed for wheezing or shortness of breath.   promethazine 12.5 MG tablet Commonly known as: PHENERGAN Take 1 tablet (12.5 mg total) by mouth every 6 (six) hours as needed for nausea or vomiting.   Restasis 0.05 % ophthalmic emulsion Generic drug: cycloSPORINE Place 1 drop into both eyes 2 (two) times daily.   rizatriptan 10 MG disintegrating tablet Commonly known as: MAXALT-MLT Take 1 tablet (10 mg total) by mouth as needed for migraine. May repeat in 2 hours if needed What changed: when to take this   Suboxone 8-2 MG Film Generic drug: Buprenorphine HCl-Naloxone HCl Place  1 Film under the tongue 3 (three) times daily.   sucralfate 1 g tablet Commonly known as: CARAFATE Take 1 tablet (1 g total) by mouth 3 (three) times daily.   valACYclovir 500 MG tablet Commonly known as: VALTREX Take 500 mg by mouth daily as needed (as directed, for outbreaks).        Follow-up Information     [facility] Follow up in 1 week(s).   Contact information: [address] [place] [address] [phone]         Talmage Coin, MD Follow up in 1 week(s).   Specialty:  Endocrinology Why: or with [facility] Contact information: [address] [place] [address] [phone]                No Known Allergies  The results of significant diagnostics from this hospitalization (including imaging, microbiology, ancillary and laboratory) are listed below for reference.    Microbiology: Recent Results (from the past 240 hour(s))  Resp Panel by RT-PCR (Flu A&B, Covid) Urine, Clean Catch     Status: None   Collection Time: 09/11/21  3:53 PM   Specimen: Urine, Clean Catch; Nasopharyngeal(NP) swabs in vial transport medium  Result Value Ref Range Status   SARS Coronavirus 2 by RT PCR NEGATIVE NEGATIVE Final    Comment: (NOTE) SARS-CoV-2 target nucleic acids are NOT DETECTED.  The SARS-CoV-2 RNA is generally detectable in upper respiratory specimens during the acute phase of infection. The lowest concentration of SARS-CoV-2 viral copies this assay can detect is 138 copies/mL. A negative result does not preclude SARS-Cov-2 infection and should not be used as the sole basis for treatment or other patient management decisions. A negative result may occur with  improper specimen collection/handling, submission of specimen other than nasopharyngeal swab, presence of viral mutation(s) within the areas targeted by this assay, and inadequate number of viral copies(<138 copies/mL). A negative result must be combined with clinical observations, patient history, and epidemiological information. The expected result is Negative.  Fact Sheet for Patients:  BloggerCourse.com  Fact Sheet for Healthcare Providers:  SeriousBroker.it  This test is no t yet approved or cleared by the [place] FDA and  has been authorized for detection and/or diagnosis of SARS-CoV-2 by FDA under an Emergency Use Authorization (EUA). This EUA will remain  in effect (meaning  this test can be used) for the duration of the COVID-19 declaration under Section 564(b)(1) of the Act, 21 U.S.C.section 360bbb-3(b)(1), unless the authorization is terminated  or revoked sooner.       Influenza A by PCR NEGATIVE NEGATIVE Final   Influenza B by PCR NEGATIVE NEGATIVE Final    Comment: (NOTE) The Xpert Xpress SARS-CoV-2/FLU/RSV plus assay is intended as an aid in the diagnosis of influenza from Nasopharyngeal swab specimens and should not be used as a sole basis for treatment. Nasal washings and aspirates are unacceptable for Xpert Xpress SARS-CoV-2/FLU/RSV testing.  Fact Sheet for Patients: BloggerCourse.com  Fact Sheet for Healthcare Providers: SeriousBroker.it  This test is not yet approved or cleared by the [place] FDA and has been authorized for detection and/or diagnosis of SARS-CoV-2 by FDA under an Emergency Use Authorization (EUA). This EUA will remain in effect (meaning this test can be used) for the duration of the COVID-19 declaration under Section 564(b)(1) of the Act, 21 U.S.C. section 360bbb-3(b)(1), unless the authorization is terminated or revoked.  Performed at [facility], [address]  [place], [place] [address]   MRSA Next Gen by PCR, Nasal     Status: None   Collection Time: 09/11/21  8:33 PM   Specimen: Nasal Mucosa; Nasal Swab  Result Value Ref Range Status   MRSA by PCR Next Gen NOT DETECTED NOT DETECTED Final    Comment: (NOTE) The GeneXpert MRSA Assay (FDA approved for NASAL specimens only), is one component of a comprehensive MRSA colonization surveillance program. It is not intended to diagnose MRSA infection nor to guide or monitor treatment for MRSA infections. Test performance is not FDA approved in patients less than 87 years old. Performed at [facility], [address] [place], [place] [address]     Procedures/Studies: CT  Head Wo Contrast  Result Date: 09/11/2021 CLINICAL DATA:  Mental status change, unknown cause EXAM: CT HEAD WITHOUT CONTRAST TECHNIQUE: Contiguous axial images were obtained from the base of the skull through the vertex without intravenous contrast. COMPARISON:  06/08/2021 FINDINGS: Brain: There is no acute intracranial hemorrhage, mass effect, or edema. Gray-white differentiation is preserved. There is no extra-axial fluid collection. Ventricles and sulci are within normal limits in size and configuration. Vascular: No hyperdense vessel or unexpected calcification. Skull: Calvarium is unremarkable. Sinuses/Orbits: No acute finding. Other: None. IMPRESSION: No acute intracranial abnormality. Electronically Signed   By: Guadlupe Spanish M.D.   On: 09/11/2021 11:49   CT ABDOMEN PELVIS W CONTRAST  Result Date: 08/31/2021 CLINICAL DATA:  Abdominal pain. EXAM: CT ABDOMEN AND PELVIS WITH CONTRAST TECHNIQUE: Multidetector CT imaging of the abdomen and pelvis was performed using the standard protocol following bolus administration of intravenous contrast. CONTRAST:  80mL OMNIPAQUE IOHEXOL 350 MG/ML SOLN COMPARISON:  CT abdomen pelvis dated 08/17/2021. FINDINGS: Lower chest: Minimal bibasilar dependent atelectasis. The visualized lung bases are otherwise clear. No intra-abdominal free air or free fluid. Hepatobiliary: The liver is unremarkable. No intrahepatic biliary dilatation. Cholecystectomy. Pancreas: Unremarkable. No pancreatic ductal dilatation or surrounding inflammatory changes. Spleen: Normal in size without focal abnormality. Adrenals/Urinary Tract: The adrenal glands are unremarkable. The kidneys, visualized ureters, and urinary bladder appear unremarkable. Stomach/Bowel: There is no bowel obstruction or active inflammation. The appendix is normal. Vascular/Lymphatic: The abdominal aorta and IVC unremarkable. No portal venous gas. There is no adenopathy. Reproductive: Hysterectomy.  No adnexal masses.  Other: None Musculoskeletal: No acute or significant osseous findings. IMPRESSION: No acute intra-abdominal or pelvic pathology. Electronically Signed   By: Elgie Collard M.D.   On: 08/31/2021 20:23   UE VENOUS DUPLEX (7am - 7pm)  Result Date: 09/06/2021 UPPER VENOUS STUDY  Patient Name:  Tricia Brown  Date of Exam:   09/05/2021 Medical Rec #: [id number]      Accession #:    [id number] Date of Birth: 1976-08-11      Patient Gender: F Patient Age:   48 years Exam Location:  [facility] Procedure:      VAS [place] UPPER EXTREMITY VENOUS DUPLEX Referring Phys: MICHAEL BUTLER --------------------------------------------------------------------------------  Indications: Pain Risk Factors: Trauma. Comparison Study: No prior studies. Performing Technologist: Chanda Busing RVT  Examination Guidelines: A complete evaluation includes B-mode imaging, spectral Doppler, color Doppler, and power Doppler as needed of all accessible portions of each vessel. Bilateral testing is considered an integral part of a complete examination. Limited examinations for reoccurring indications may be performed as noted.  Right Findings: +----------+------------+---------+-----------+----------+-------+  RIGHT      Compressible Phasicity Spontaneous Properties Summary  +----------+------------+---------+-----------+----------+-------+  IJV  Full        Yes        Yes                         +----------+------------+---------+-----------+----------+-------+  Subclavian     Full        Yes        Yes                         +----------+------------+---------+-----------+----------+-------+  Axillary       Full        Yes        Yes                         +----------+------------+---------+-----------+----------+-------+  Brachial       Full        Yes        Yes                         +----------+------------+---------+-----------+----------+-------+  Radial         Full                                                +----------+------------+---------+-----------+----------+-------+  Ulnar          Full                                               +----------+------------+---------+-----------+----------+-------+  Cephalic       None                                       Acute   +----------+------------+---------+-----------+----------+-------+  Basilic        Full                                               +----------+------------+---------+-----------+----------+-------+ Thrombus located in the cephalic vein is noted to be in the antecubital fossa.  Left Findings: +----------+------------+---------+-----------+----------+-------+  LEFT       Compressible Phasicity Spontaneous Properties Summary  +----------+------------+---------+-----------+----------+-------+  Subclavian     Full        Yes        Yes                         +----------+------------+---------+-----------+----------+-------+  Summary:  Right: No evidence of deep vein thrombosis in the upper extremity. Findings consistent with acute superficial vein thrombosis involving the right cephalic vein.  Left: No evidence of thrombosis in the subclavian.  *See table(s) above for measurements and observations.  Diagnosing physician: Gerarda Fraction Electronically signed by Gerarda Fraction on 09/06/2021 at 10:28:04 AM.    Final     Labs: BNP (last 3 results) No results for input(s): BNP in the last 8760 hours. Basic Metabolic Panel: Recent Labs  Lab 09/13/21 1106 09/14/21 0259 09/15/21 0312 09/16/21 0304 09/17/21 0249  NA 139 139 140  137 138  K 3.6 4.3 4.3 3.9 3.6  CL 109 108 104 102 104  CO2 GLUCOSE 55* 107* 95 98 96  BUN 5* CREATININE 0.67 0.78 0.69 0.64 0.83  CALCIUM 8.5* 8.6* 9.2 8.9 9.1   Liver Function Tests: Recent Labs  Lab 09/11/21 1224 09/12/21 0330  AST 21 31  ALT 70* 66*  ALKPHOS 107 117  BILITOT 0.6 1.0  PROT 6.0* 6.7  ALBUMIN 3.4* 3.7   Recent Labs  Lab 09/11/21 1224  LIPASE 21   Recent Labs   Lab 09/11/21 1839  AMMONIA 22   CBC: Recent Labs  Lab 09/11/21 1224 09/12/21 0330  WBC 8.1 5.8  NEUTROABS 6.3  --   HGB 12.2 12.7  HCT 38.5 39.2  MCV 95.3 90.7  PLT 237 218   Cardiac Enzymes: No results for input(s): CKTOTAL, CKMB, CKMBINDEX, TROPONINI in the last 168 hours. BNP: Invalid input(s): POCBNP CBG: Recent Labs  Lab 09/17/21 0005 09/17/21 0203 09/17/21 0354 09/17/21 0602 09/17/21 0748  GLUCAP 103* 113* 101* 92 84   D-Dimer No results for input(s): DDIMER in the last 72 hours. Hgb A1c No results for input(s): HGBA1C in the last 72 hours. Lipid Profile No results for input(s): CHOL, HDL, LDLCALC, TRIG, CHOLHDL, LDLDIRECT in the last 72 hours. Thyroid function studies No results for input(s): TSH, T4TOTAL, T3FREE, THYROIDAB in the last 72 hours.  Invalid input(s): FREET3 Anemia work up No results for input(s): VITAMINB12, FOLATE, FERRITIN, TIBC, IRON, RETICCTPCT in the last 72 hours. Urinalysis    Component Value Date/Time   COLORURINE YELLOW 09/15/2021 0117   APPEARANCEUR CLEAR 09/15/2021 0117   APPEARANCEUR CLEAR 03/23/2013 1621   LABSPEC <1.005 (L) 09/15/2021 0117   LABSPEC 1.021 03/23/2013 1621   PHURINE 6.5 09/15/2021 0117   GLUCOSEU NEGATIVE 09/15/2021 0117   GLUCOSEU see comment 03/23/2013 1621   HGBUR NEGATIVE 09/15/2021 0117   BILIRUBINUR NEGATIVE 09/15/2021 0117   BILIRUBINUR see comment 03/23/2013 1621   KETONESUR NEGATIVE 09/15/2021 0117   PROTEINUR NEGATIVE 09/15/2021 0117   UROBILINOGEN 1.0 09/29/2014 2116   NITRITE NEGATIVE 09/15/2021 0117   LEUKOCYTESUR NEGATIVE 09/15/2021 0117   LEUKOCYTESUR see comment 03/23/2013 1621   Sepsis Labs Invalid input(s): PROCALCITONIN,  WBC,  LACTICIDVEN Microbiology Recent Results (from the past 240 hour(s))  Resp Panel by RT-PCR (Flu A&B, Covid) Urine, Clean Catch     Status: None   Collection Time: 09/11/21  3:53 PM   Specimen: Urine, Clean Catch; Nasopharyngeal(NP) swabs in vial transport  medium  Result Value Ref Range Status   SARS Coronavirus 2 by RT PCR NEGATIVE NEGATIVE Final    Comment: (NOTE) SARS-CoV-2 target nucleic acids are NOT DETECTED.  The SARS-CoV-2 RNA is generally detectable in upper respiratory specimens during the acute phase of infection. The lowest concentration of SARS-CoV-2 viral copies this assay can detect is 138 copies/mL. A negative result does not preclude SARS-Cov-2 infection and should not be used as the sole basis for treatment or other patient management decisions. A negative result may occur with  improper specimen collection/handling, submission of specimen other than nasopharyngeal swab, presence of viral mutation(s) within the areas targeted by this assay, and inadequate number of viral copies(<138 copies/mL). A negative result must be combined with clinical observations, patient history, and epidemiological information. The expected result is Negative.  Fact Sheet for Patients:  BloggerCourse.com  Fact Sheet for Healthcare Providers:  SeriousBroker.it  This test is no t  yet approved or cleared by the [place] and  has been authorized for detection and/or diagnosis of SARS-CoV-2 by FDA under an Emergency Use Authorization (EUA). This EUA will remain  in effect (meaning this test can be used) for the duration of the COVID-19 declaration under Section 564(b)(1) of the Act, 21 U.S.C.section 360bbb-3(b)(1), unless the authorization is terminated  or revoked sooner.       Influenza A by PCR NEGATIVE NEGATIVE Final   Influenza B by PCR NEGATIVE NEGATIVE Final    Comment: (NOTE) The Xpert Xpress SARS-CoV-2/FLU/RSV plus assay is intended as an aid in the diagnosis of influenza from Nasopharyngeal swab specimens and should not be used as a sole basis for treatment. Nasal washings and aspirates are unacceptable for Xpert Xpress SARS-CoV-2/FLU/RSV testing.  Fact Sheet for  Patients: BloggerCourse.com  Fact Sheet for Healthcare Providers: SeriousBroker.it  This test is not yet approved or cleared by the [place] FDA and has been authorized for detection and/or diagnosis of SARS-CoV-2 by FDA under an Emergency Use Authorization (EUA). This EUA will remain in effect (meaning this test can be used) for the duration of the COVID-19 declaration under Section 564(b)(1) of the Act, 21 U.S.C. section 360bbb-3(b)(1), unless the authorization is terminated or revoked.  Performed at [facility], [address], [place], [place] [address]   MRSA Next Gen by PCR, Nasal     Status: None   Collection Time: 09/11/21  8:33 PM   Specimen: Nasal Mucosa; Nasal Swab  Result Value Ref Range Status   MRSA by PCR Next Gen NOT DETECTED NOT DETECTED Final    Comment: (NOTE) The GeneXpert MRSA Assay (FDA approved for NASAL specimens only), is one component of a comprehensive MRSA colonization surveillance program. It is not intended to diagnose MRSA infection nor to guide or monitor treatment for MRSA infections. Test performance is not FDA approved in patients less than 29 years old. Performed at [facility], [address] [place], [place] [address]      Time coordinating discharge: 25 minutes  SIGNED: Lanae Boast, MD  [facility] 09/17/2021, 12:42 PM  If 7PM-7AM, please contact night-coverage [email or web address]

## 2021-09-19 LAB — SULFONYLUREA HYPOGLYCEMICS PANEL, SERUM
Acetohexamide: NEGATIVE ug/mL (ref 20–60)
Chlorpropamide: NEGATIVE ug/mL (ref 75–250)
Glimepiride: NEGATIVE ng/mL (ref 80–250)
Glipizide: NEGATIVE ng/mL (ref 200–1000)
Glyburide: NEGATIVE ng/mL
Nateglinide: NEGATIVE ng/mL
Repaglinide: NEGATIVE ng/mL
Tolazamide: NEGATIVE ug/mL
Tolbutamide: NEGATIVE ug/mL (ref 40–100)

## 2021-09-21 LAB — INSULIN ANTIBODIES, BLOOD: Insulin Antibodies, Human: <5 uU/mL

## 2021-09-29 ENCOUNTER — Ambulatory Visit: Payer: Medicaid Other | Admitting: Adult Health

## 2021-09-29 ENCOUNTER — Encounter: Payer: Self-pay | Admitting: Adult Health

## 2021-10-27 ENCOUNTER — Emergency Department (HOSPITAL_COMMUNITY)
Admission: EM | Admit: 2021-10-27 | Discharge: 2021-10-27 | Disposition: A | Discharge status: Home / Self Care | Payer: Medicaid Other | Attending: Emergency Medicine | Admitting: Emergency Medicine

## 2021-10-27 ENCOUNTER — Other Ambulatory Visit: Payer: Self-pay

## 2021-10-27 ENCOUNTER — Encounter (HOSPITAL_COMMUNITY): Payer: Self-pay

## 2021-10-27 DIAGNOSIS — R112 Nausea with vomiting, unspecified: Secondary | ICD-10-CM | POA: Diagnosis not present

## 2021-10-27 DIAGNOSIS — R1084 Generalized abdominal pain: Secondary | ICD-10-CM | POA: Diagnosis not present

## 2021-10-27 DIAGNOSIS — R000 Tachycardia, unspecified: Secondary | ICD-10-CM | POA: Insufficient documentation

## 2021-10-27 DIAGNOSIS — R109 Unspecified abdominal pain: Secondary | ICD-10-CM | POA: Diagnosis present

## 2021-10-27 LAB — URINALYSIS, ROUTINE W REFLEX MICROSCOPIC
Bilirubin Urine: NEGATIVE
Glucose, UA: NEGATIVE mg/dL
Hgb urine dipstick: NEGATIVE
Ketones, ur: 5 mg/dL — AB
Leukocytes,Ua: NEGATIVE
Nitrite: NEGATIVE
Protein, ur: NEGATIVE mg/dL
Specific Gravity, Urine: 1.02 (ref 1.005–1.030)
pH: 5 (ref 5.0–8.0)

## 2021-10-27 LAB — CBC WITH DIFFERENTIAL/PLATELET
Abs Immature Granulocytes: 0.01 10*3/uL (ref 0.00–0.07)
Basophils Absolute: 0.1 10*3/uL (ref 0.0–0.1)
Basophils Relative: 1 %
Eosinophils Absolute: 0 10*3/uL (ref 0.0–0.5)
Eosinophils Relative: 1 %
HCT: 43.4 % (ref 36.0–46.0)
Hemoglobin: 14.4 g/dL (ref 12.0–15.0)
Immature Granulocytes: 0 %
Lymphocytes Relative: 14 %
Lymphs Abs: 0.9 10*3/uL (ref 0.7–4.0)
MCH: 29.9 pg (ref 26.0–34.0)
MCHC: 33.2 g/dL (ref 30.0–36.0)
MCV: 90 fL (ref 80.0–100.0)
Monocytes Absolute: 0.9 10*3/uL (ref 0.1–1.0)
Monocytes Relative: 15 %
Neutro Abs: 4.2 10*3/uL (ref 1.7–7.7)
Neutrophils Relative %: 69 %
Platelets: 260 10*3/uL (ref 150–400)
RBC: 4.82 MIL/uL (ref 3.87–5.11)
RDW: 14.1 % (ref 11.5–15.5)
WBC: 6 10*3/uL (ref 4.0–10.5)
nRBC: 0 % (ref 0.0–0.2)

## 2021-10-27 LAB — COMPREHENSIVE METABOLIC PANEL
ALT: 19 U/L (ref 0–44)
AST: 21 U/L (ref 15–41)
Albumin: 4.4 g/dL (ref 3.5–5.0)
Alkaline Phosphatase: 144 U/L — ABNORMAL HIGH (ref 38–126)
Anion gap: 9 (ref 5–15)
BUN: 11 mg/dL (ref 6–20)
CO2: 27 mmol/L (ref 22–32)
Calcium: 9.9 mg/dL (ref 8.9–10.3)
Chloride: 101 mmol/L (ref 98–111)
Creatinine, Ser: 0.81 mg/dL (ref 0.44–1.00)
GFR, Estimated: >60 mL/min (ref >60)
Glucose, Bld: 103 mg/dL — ABNORMAL HIGH (ref 70–99)
Potassium: 4.7 mmol/L (ref 3.5–5.1)
Sodium: 137 mmol/L (ref 135–145)
Total Bilirubin: 0.4 mg/dL (ref 0.3–1.2)
Total Protein: 7.5 g/dL (ref 6.5–8.1)

## 2021-10-27 LAB — HCG, QUANTITATIVE, PREGNANCY: hCG, Beta Chain, Quant, S: 4 m[IU]/mL (ref <5)

## 2021-10-27 LAB — AMYLASE: Amylase: 37 U/L (ref 28–100)

## 2021-10-27 LAB — LIPASE, BLOOD: Lipase: 23 U/L (ref 11–51)

## 2021-10-27 MED ORDER — SODIUM CHLORIDE 0.9 % IV BOLUS
1000.0000 mL | Freq: Once | INTRAVENOUS | Status: AC
  Administered 2021-10-27: 1000 mL via INTRAVENOUS

## 2021-10-27 MED ORDER — HALOPERIDOL LACTATE 5 MG/ML IJ SOLN
2.0000 mg | Freq: Once | INTRAMUSCULAR | Status: AC
  Administered 2021-10-27: 2 mg via INTRAMUSCULAR

## 2021-10-27 MED ORDER — ONDANSETRON 8 MG PO TBDP
8.0000 mg | ORAL_TABLET | Freq: Once | ORAL | Status: AC
  Administered 2021-10-27: 8 mg via ORAL
  Filled 2021-10-27: qty 1

## 2021-10-27 MED ORDER — PROMETHAZINE HCL 25 MG RE SUPP: 25.0000 mg | Freq: Four times a day (QID) | RECTAL | 0 refills | Status: DC | PRN

## 2021-10-27 MED ORDER — DIPHENHYDRAMINE HCL 50 MG/ML IJ SOLN
25.0000 mg | Freq: Once | INTRAMUSCULAR | Status: AC
  Administered 2021-10-27: 25 mg via INTRAMUSCULAR

## 2021-10-27 MED ORDER — HALOPERIDOL LACTATE 5 MG/ML IJ SOLN
2.0000 mg | Freq: Once | INTRAMUSCULAR | Status: DC
  Filled 2021-10-27: qty 1

## 2021-10-27 MED ORDER — DIPHENHYDRAMINE HCL 50 MG/ML IJ SOLN
12.5000 mg | Freq: Once | INTRAMUSCULAR | Status: DC
  Filled 2021-10-27: qty 1

## 2021-10-27 NOTE — ED Notes (Signed)
Patient stuck x 2 no success

## 2021-10-27 NOTE — ED Provider Notes (Signed)
[facility]-EMERGENCY DEPT Provider Note   CSN: [id number] Arrival date & time: 10/27/21  1840     History  Chief Complaint  Patient presents with   Abdominal Pain   Emesis    Tricia Brown is a 46 y.o. female.  46 year old female with history of chronic pancreatitis, IBS, methadone dependence, presents with complaint of pancreatitis flare onset today with abdominal pain which radiates towards her left shoulder associated with bilious emesis and nausea without fevers, chills, changes in bowel or bladder habits although states she has some pain over her left kidney as well.  Patient took Zofran at home without relief.      Home Medications Prior to Admission medications   Medication Sig Start Date End Date Taking? Authorizing Provider  promethazine (PHENERGAN) 25 MG suppository Place 1 suppository (25 mg total) rectally every 6 (six) hours as needed for nausea or vomiting. 10/27/21  Yes Jeannie Fend, PA-C  budesonide-formoterol (SYMBICORT) 80-4.5 MCG/ACT inhaler Inhale 2 puffs into the lungs in the morning and at bedtime.    [provider]  DULoxetine (CYMBALTA) 60 MG capsule Take 60 mg by mouth daily.    [provider]  ferrous sulfate 324 MG TBEC Take 324 mg by mouth daily with breakfast.    [provider]  fluorometholone (FML) 0.1 % ophthalmic suspension Place 1 drop into both eyes 2 (two) times daily. 05/23/21   [provider]  gabapentin (NEURONTIN) 300 MG capsule Take 2 capsules (600 mg total) by mouth 3 (three) times daily. Patient taking differently: Take 300 mg by mouth 3 (three) times daily. 06/11/21 08/16/22  [name], [name], DO  Galcanezumab-gnlm (EMGALITY) 120 MG/ML SOAJ Inject 120 mLs into the skin every 30 (thirty) days.    [provider]  lipase/protease/amylase (CREON) 36000 UNITS CPEP capsule Take 36,000 Units by mouth See admin instructions. 108000 units oral with meals 20355 units with snacks     [provider]  Multiple Vitamin (MULTIVITAMIN WITH MINERALS) TABS tablet Take 1 tablet by mouth daily. 09/04/21   Joseph Art, DO  ondansetron (ZOFRAN) 4 MG tablet Take 1 tablet (4 mg total) by mouth every 8 (eight) hours as needed for nausea or vomiting. 08/07/20   Hongalgi, Maximino Greenland, MD  pantoprazole (PROTONIX) 40 MG tablet Take 1 tablet (40 mg total) by mouth daily. 06/01/21 08/16/22  Long, Arlyss Repress, MD  PROAIR HFA 108 [phone] Base) MCG/ACT inhaler Inhale 2 puffs into the lungs every 6 (six) hours as needed for wheezing or shortness of breath.    [provider]  promethazine (PHENERGAN) 12.5 MG tablet Take 1 tablet (12.5 mg total) by mouth every 6 (six) hours as needed for nausea or vomiting. 05/19/21   Anson Fret, MD  RESTASIS 0.05 % ophthalmic emulsion Place 1 drop into both eyes 2 (two) times daily. 05/25/21   [provider]  rizatriptan (MAXALT-MLT) 10 MG disintegrating tablet Take 1 tablet (10 mg total) by mouth as needed for migraine. May repeat in 2 hours if needed Patient taking differently: Take 10 mg by mouth daily as needed for migraine. May repeat in 2 hours if needed 05/19/21   Anson Fret, MD  SUBOXONE 8-2 MG FILM Place 1 Film under the tongue 3 (three) times daily. 02/17/21   [provider]  sucralfate (CARAFATE) 1 g tablet Take 1 tablet (1 g total) by mouth 3 (three) times daily. 09/03/21   Joseph Art, DO  valACYclovir (VALTREX) 500  MG tablet Take 500 mg by mouth daily as needed (as directed, for outbreaks). Patient not taking: Reported on 09/12/2021 07/27/20   [provider]      Allergies    Patient has no known allergies.    Review of Systems   Review of Systems Negative except as per HPI Physical Exam Updated Vital Signs BP 107/86 (BP Location: Left Arm)    Pulse (!) 104    Temp (!) 97.1 F (36.2 C) (Oral)    Resp 18    Ht 5\' 7"  (1.702 m)    Wt 70.3 kg    SpO2 100%    BMI 24.28 kg/m  Physical Exam Vitals and  nursing note reviewed.  Constitutional:      General: She is not in acute distress.    Appearance: She is well-developed. She is not diaphoretic.  HENT:     Head: Normocephalic and atraumatic.  Cardiovascular:     Rate and Rhythm: Normal rate and regular rhythm.     Heart sounds: Normal heart sounds.  Pulmonary:     Effort: Pulmonary effort is normal.     Breath sounds: Normal breath sounds.  Abdominal:     Palpations: Abdomen is soft.     Tenderness: There is generalized abdominal tenderness. There is no right CVA tenderness or left CVA tenderness.  Skin:    General: Skin is warm and dry.     Findings: No erythema or rash.  Neurological:     Mental Status: She is alert and oriented to person, place, and time.  Psychiatric:        Behavior: Behavior normal.    ED Results / Procedures / Treatments   Labs (all labs ordered are listed, but only abnormal results are displayed) Labs Reviewed  COMPREHENSIVE METABOLIC PANEL - Abnormal; Notable for the following components:      Result Value   Glucose, Bld 103 (*)    Alkaline Phosphatase 144 (*)    All other components within normal limits  URINALYSIS, ROUTINE W REFLEX MICROSCOPIC - Abnormal; Notable for the following components:   Color, Urine AMBER (*)    Ketones, ur 5 (*)    All other components within normal limits  LIPASE, BLOOD  CBC WITH DIFFERENTIAL/PLATELET  HCG, QUANTITATIVE, PREGNANCY  AMYLASE    EKG None  Radiology No results found.  Procedures Procedures    Medications Ordered in ED Medications  ondansetron (ZOFRAN-ODT) disintegrating tablet 8 mg (8 mg Oral Given 10/27/21 2104)  sodium chloride 0.9 % bolus 1,000 mL (0 mLs Intravenous Stopped 10/27/21 2222)  haloperidol lactate (HALDOL) injection 2 mg (2 mg Intramuscular Given 10/27/21 2212)  diphenhydrAMINE (BENADRYL) injection 25 mg (25 mg Intramuscular Given 10/27/21 2212)    ED Course/ Medical Decision Making/ A&P                           Medical  Decision Making Amount and/or Complexity of Data Reviewed Labs: ordered.  Risk Prescription drug management.   This patient presents to the ED for concern of abdominal pain with nausea and vomiting, this involves an extensive number of treatment options, and is a complaint that carries with it a high risk of complications and morbidity.  The differential diagnosis includes but not limited to acute on chronic pancreatitis, gastritis, pyelonephritis, cystitis   Co morbidities that complicate the patient evaluation  High pancreatitis, IBS, by dependence   Additional history obtained:   External records from outside  source obtained and reviewed including prior admission to the hospital September 11, 2021 for chronic pancreatitis with alcohol use disorder and opioid use with altered mental status   Lab Tests:  I Ordered, and personally interpreted labs.  The pertinent results include: CBC with normal white blood cell count, CMP without hepatic or renal impairment, normal electrolytes.  Lipase and normal meds, amylase within normal meds, hCG negative, urinalysis with ketones, no evidence of urinary tract infection.  Cardiac Monitoring:  The patient was maintained on a cardiac monitor.  I personally viewed and interpreted the cardiac monitored which showed an underlying rhythm of: sinus rhythm    Medicines ordered and prescription drug management:  I ordered medication including haldol, benadryl  for nausea/vomiting  Reevaluation of the patient after these medicines showed that the patient improved I have reviewed the patients home medicines and have made adjustments as needed   Test Considered:  CT abdomen/pelvis however with unremarkable labs, not felt necessary at this time  Problem List / ED Course:  46 year old female with complaint of abdominal pain with nausea and vomiting thought to be related to her chronic pancreatitis.  On exam event of generalized abdominal tenderness.   Patient is mildly tachycardic, afebrile, vitals otherwise stable.  Labs are reassuring.  Patient was given Haldol and Benadryl for her nausea and vomiting, nausea persist despite Zofran given in triage.  Also given IV fluids.  Plan is to p.o. challenge and hopefully be able to discharge improved to recheck with her PCP.   Reevaluation:  After the interventions noted above, I reevaluated the patient and found that they have :improved   Dispostion:  After consideration of the diagnostic results and the patients response to treatment, I feel that the patent would benefit from recheck with primary care provider.  Given prescription for Phenergan suppositories to use in place of her oral Phenergan.          Final Clinical Impression(s) / ED Diagnoses Final diagnoses:  Generalized abdominal pain  Nausea and vomiting, unspecified vomiting type    Rx / DC Orders ED Discharge Orders          Ordered    promethazine (PHENERGAN) 25 MG suppository  Every 6 hours PRN        10/27/21 2211              Jeannie Fend, PA-C 10/27/21 2305    Sloan Leiter, DO 10/29/21 0132

## 2021-10-27 NOTE — Discharge Instructions (Signed)
Phenergan suppository in place of oral medication if you are unable to tolerate medications by mouth. Recheck with your primary care provider.

## 2021-10-27 NOTE — ED Provider Triage Note (Signed)
Emergency Medicine Provider Triage Evaluation Note  Tricia Brown , a 46 y.o. female  was evaluated in triage.  Pt complains of left upper quadrant abdominal pain that radiates to the arm.  Also having nausea and vomiting, started on Friday worsened today.  Patient has history of chronic pancreatitis, states it feels exactly like that..  Status post cholecystectomy and hysterectomy  Review of Systems  Positive: AP, N, V Negative:   Physical Exam  BP 133/75 (BP Location: Right Arm)    Pulse (!) 114    Temp (!) 97.1 F (36.2 C) (Oral)    Resp 20    Ht 5\' 7"  (1.702 m)    Wt 70.3 kg    SpO2 97%    BMI 24.28 kg/m  Gen:   Awake, no distress   Resp:  Normal effort  MSK:   Moves extremities without difficulty  Other:  Abdomen soft, left upper quadrant tenderness.  Medical Decision Making  Medically screening exam initiated at 7:00 PM.  Appropriate orders placed.  Tricia Brown was informed that the remainder of the evaluation will be completed by another provider, this initial triage assessment does not replace that evaluation, and the importance of remaining in the ED until their evaluation is complete.     Sherrill Raring, PA-C 10/27/21 1900

## 2021-10-27 NOTE — ED Notes (Signed)
Patient given beverages for PO challenge.

## 2021-10-27 NOTE — ED Triage Notes (Signed)
Patient c/o LUQ abdominal pain and emesis that radiates into he left upper back. Patient reports that she has a history of chronic pancreatitis.

## 2021-12-04 ENCOUNTER — Encounter (HOSPITAL_COMMUNITY): Payer: Self-pay

## 2021-12-04 ENCOUNTER — Emergency Department (HOSPITAL_COMMUNITY)
Admission: EM | Admit: 2021-12-04 | Discharge: 2021-12-04 | Disposition: A | Discharge status: Home / Self Care | Payer: Medicaid Other | Attending: Emergency Medicine | Admitting: Emergency Medicine

## 2021-12-04 ENCOUNTER — Other Ambulatory Visit: Payer: Self-pay

## 2021-12-04 DIAGNOSIS — R059 Cough, unspecified: Secondary | ICD-10-CM | POA: Insufficient documentation

## 2021-12-04 DIAGNOSIS — R10817 Generalized abdominal tenderness: Secondary | ICD-10-CM | POA: Insufficient documentation

## 2021-12-04 DIAGNOSIS — R112 Nausea with vomiting, unspecified: Secondary | ICD-10-CM

## 2021-12-04 DIAGNOSIS — R197 Diarrhea, unspecified: Secondary | ICD-10-CM | POA: Diagnosis not present

## 2021-12-04 DIAGNOSIS — R000 Tachycardia, unspecified: Secondary | ICD-10-CM | POA: Insufficient documentation

## 2021-12-04 LAB — CBC WITH DIFFERENTIAL/PLATELET
Abs Immature Granulocytes: 0.03 10*3/uL (ref 0.00–0.07)
Basophils Absolute: 0.1 10*3/uL (ref 0.0–0.1)
Basophils Relative: 1 %
Eosinophils Absolute: 0.1 10*3/uL (ref 0.0–0.5)
Eosinophils Relative: 1 %
HCT: 47.7 % — ABNORMAL HIGH (ref 36.0–46.0)
Hemoglobin: 16 g/dL — ABNORMAL HIGH (ref 12.0–15.0)
Immature Granulocytes: 0 %
Lymphocytes Relative: 14 %
Lymphs Abs: 1.3 10*3/uL (ref 0.7–4.0)
MCH: 30.5 pg (ref 26.0–34.0)
MCHC: 33.5 g/dL (ref 30.0–36.0)
MCV: 90.9 fL (ref 80.0–100.0)
Monocytes Absolute: 0.7 10*3/uL (ref 0.1–1.0)
Monocytes Relative: 7 %
Neutro Abs: 7.1 10*3/uL (ref 1.7–7.7)
Neutrophils Relative %: 77 %
Platelets: 290 10*3/uL (ref 150–400)
RBC: 5.25 MIL/uL — ABNORMAL HIGH (ref 3.87–5.11)
RDW: 12.7 % (ref 11.5–15.5)
WBC: 9.2 10*3/uL (ref 4.0–10.5)
nRBC: 0 % (ref 0.0–0.2)

## 2021-12-04 LAB — COMPREHENSIVE METABOLIC PANEL
ALT: 24 U/L (ref 0–44)
AST: 30 U/L (ref 15–41)
Albumin: 4.4 g/dL (ref 3.5–5.0)
Alkaline Phosphatase: 149 U/L — ABNORMAL HIGH (ref 38–126)
Anion gap: 10 (ref 5–15)
BUN: 10 mg/dL (ref 6–20)
CO2: 27 mmol/L (ref 22–32)
Calcium: 9.5 mg/dL (ref 8.9–10.3)
Chloride: 101 mmol/L (ref 98–111)
Creatinine, Ser: 0.96 mg/dL (ref 0.44–1.00)
GFR, Estimated: >60 mL/min (ref >60)
Glucose, Bld: 90 mg/dL (ref 70–99)
Potassium: 3.7 mmol/L (ref 3.5–5.1)
Sodium: 138 mmol/L (ref 135–145)
Total Bilirubin: 0.5 mg/dL (ref 0.3–1.2)
Total Protein: 7.6 g/dL (ref 6.5–8.1)

## 2021-12-04 LAB — URINALYSIS, ROUTINE W REFLEX MICROSCOPIC
Bacteria, UA: NONE SEEN
Bilirubin Urine: NEGATIVE
Glucose, UA: NEGATIVE mg/dL
Hgb urine dipstick: NEGATIVE
Ketones, ur: NEGATIVE mg/dL
Nitrite: NEGATIVE
Protein, ur: NEGATIVE mg/dL
Specific Gravity, Urine: 1.003 — ABNORMAL LOW (ref 1.005–1.030)
pH: 6 (ref 5.0–8.0)

## 2021-12-04 LAB — LIPASE, BLOOD: Lipase: 24 U/L (ref 11–51)

## 2021-12-04 MED ORDER — PANTOPRAZOLE SODIUM 20 MG PO TBEC
20.0000 mg | DELAYED_RELEASE_TABLET | Freq: Every day | ORAL | Status: DC
  Administered 2021-12-04: 20 mg via ORAL
  Filled 2021-12-04: qty 1

## 2021-12-04 MED ORDER — MORPHINE SULFATE (PF) 4 MG/ML IV SOLN
4.0000 mg | Freq: Once | INTRAVENOUS | Status: AC
  Administered 2021-12-04: 4 mg via INTRAVENOUS
  Filled 2021-12-04: qty 1

## 2021-12-04 MED ORDER — ONDANSETRON HCL 4 MG/2ML IJ SOLN
4.0000 mg | Freq: Once | INTRAMUSCULAR | Status: AC
  Administered 2021-12-04: 4 mg via INTRAVENOUS
  Filled 2021-12-04: qty 2

## 2021-12-04 MED ORDER — PROMETHAZINE HCL 25 MG RE SUPP: 25.0000 mg | Freq: Four times a day (QID) | RECTAL | 0 refills | Status: AC | PRN

## 2021-12-04 MED ORDER — ONDANSETRON HCL 4 MG/2ML IJ SOLN
4.0000 mg | Freq: Once | INTRAMUSCULAR | Status: AC
Start: 2021-12-04 — End: 2021-12-04
  Administered 2021-12-04: 4 mg via INTRAVENOUS
  Filled 2021-12-04: qty 2

## 2021-12-04 MED ORDER — SODIUM CHLORIDE 0.9 % IV BOLUS
1000.0000 mL | Freq: Once | INTRAVENOUS | Status: AC
Start: 2021-12-04 — End: 2021-12-04
  Administered 2021-12-04: 1000 mL via INTRAVENOUS

## 2021-12-04 NOTE — ED Triage Notes (Signed)
Pt c/o N/V/D x7 days. Pt denies fever/chills. Pt states she has taken zofran at home with no relief.  ?

## 2021-12-04 NOTE — Discharge Instructions (Addendum)
Your symptoms may be due to irritable bowel syndrome.  Please take Phenergan as needed for nausea.  Follow-up with GI specialist for further evaluation.  Return if you have any concern. ?

## 2021-12-04 NOTE — ED Provider Notes (Signed)
?[facility]-EMERGENCY DEPT ?Provider Note ? ? ?CSN: [id number] ?Arrival date & time: 12/04/21  1345 ? ?  ? ?History ? ?Chief Complaint  ?Patient presents with  ? Nausea  ? Emesis  ? Diarrhea  ? ? ?Tricia Brown is a 46 y.o. female. ? ?The history is provided by the patient and medical records. No language interpreter was used.  ?Emesis ?Associated symptoms: diarrhea   ?Diarrhea ?Associated symptoms: vomiting   ? ?46 year old female with significant history of opioid abuse with dependence's, endometriosis, recurrent pancreatitis, alcohol use, depression presenting complaining of abdominal pain.  Patient report for the past 2 weeks she has had recurrent episodes of nausea, cough, pain with nonbloody nonbilious vomitus, and having loose stools.  States she would have diarrhea 2-3 times daily.  She endorsed upper abdominal cramping that is waxing waning mild to moderate in severity.  She denies having any fever or chills no chest pain shortness of breath productive cough no dysuria or hematuria she denies any recent sick contact.  Patient states she is clean from alcohol tobacco or drugs for more than a year.  She did try taking Zofran and try to stay hydrated at home without adequate relief. ? ?Home Medications ?Prior to Admission medications   ?Medication Sig Start Date End Date Taking? Authorizing Provider  ?budesonide-formoterol (SYMBICORT) 80-4.5 MCG/ACT inhaler Inhale 2 puffs into the lungs in the morning and at bedtime.    [provider]  ?DULoxetine (CYMBALTA) 60 MG capsule Take 60 mg by mouth daily.    [provider]  ?ferrous sulfate 324 MG TBEC Take 324 mg by mouth daily with breakfast.    [provider]  ?fluorometholone (FML) 0.1 % ophthalmic suspension Place 1 drop into both eyes 2 (two) times daily. 05/23/21   [provider]  ?gabapentin (NEURONTIN) 300 MG capsule Take 2 capsules (600 mg total) by mouth 3 (three) times daily. ?Patient taking  differently: Take 300 mg by mouth 3 (three) times daily. 06/11/21 08/16/22  Reece Leader, DO  ?Galcanezumab-gnlm (EMGALITY) 120 MG/ML SOAJ Inject 120 mLs into the skin every 30 (thirty) days.    [provider]  ?lipase/protease/amylase (CREON) 36000 UNITS CPEP capsule Take 36,000 Units by mouth See admin instructions. 108000 units oral with meals ?36000 units with snacks    [provider]  ?Multiple Vitamin (MULTIVITAMIN WITH MINERALS) TABS tablet Take 1 tablet by mouth daily. 09/04/21   Joseph Art, DO  ?ondansetron (ZOFRAN) 4 MG tablet Take 1 tablet (4 mg total) by mouth every 8 (eight) hours as needed for nausea or vomiting. 08/07/20   Hongalgi, Maximino Greenland, MD  ?pantoprazole (PROTONIX) 40 MG tablet Take 1 tablet (40 mg total) by mouth daily. 06/01/21 08/16/22  LongArlyss Repress, MD  ?PROAIR HFA 108 [phone] Base) MCG/ACT inhaler Inhale 2 puffs into the lungs every 6 (six) hours as needed for wheezing or shortness of breath.    [provider]  ?promethazine (PHENERGAN) 12.5 MG tablet Take 1 tablet (12.5 mg total) by mouth every 6 (six) hours as needed for nausea or vomiting. 05/19/21   Anson Fret, MD  ?promethazine (PHENERGAN) 25 MG suppository Place 1 suppository (25 mg total) rectally every 6 (six) hours as needed for nausea or vomiting. 10/27/21   Jeannie Fend, PA-C  ?RESTASIS 0.05 % ophthalmic emulsion Place 1 drop into both eyes 2 (two) times daily. 05/25/21   [provider]  ?rizatriptan (MAXALT-MLT) 10 MG disintegrating tablet Take 1 tablet (10 mg  total) by mouth as needed for migraine. May repeat in 2 hours if needed ?Patient taking differently: Take 10 mg by mouth daily as needed for migraine. May repeat in 2 hours if needed 05/19/21   Anson FretAhern, Antonia B, MD  ?SUBOXONE 8-2 MG FILM Place 1 Film under the tongue 3 (three) times daily. 02/17/21   [provider]  ?sucralfate (CARAFATE) 1 g tablet Take 1 tablet (1 g total) by mouth 3 (three) times daily. 09/03/21    Joseph ArtVann, Jessica U, DO  ?valACYclovir (VALTREX) 500 MG tablet Take 500 mg by mouth daily as needed (as directed, for outbreaks). ?Patient not taking: Reported on 09/12/2021 07/27/20   [provider]  ?   ? ?Allergies    ?Patient has no known allergies.   ? ?Review of Systems   ?Review of Systems  ?Gastrointestinal:  Positive for diarrhea and vomiting.  ?All other systems reviewed and are negative. ? ?Physical Exam ?Updated Vital Signs ?BP 129/70 (BP Location: Left Arm)   Pulse (!) 108   Temp 97.9 ?F (36.6 ?C) (Oral)   Resp 16   SpO2 99%  ?Physical Exam ?Vitals and nursing note reviewed.  ?Constitutional:   ?   General: She is not in acute distress. ?   Appearance: She is well-developed.  ?HENT:  ?   Head: Atraumatic.  ?Eyes:  ?   Conjunctiva/sclera: Conjunctivae normal.  ?Cardiovascular:  ?   Rate and Rhythm: Tachycardia present.  ?   Pulses: Normal pulses.  ?   Heart sounds: Normal heart sounds.  ?Pulmonary:  ?   Effort: Pulmonary effort is normal.  ?   Breath sounds: No wheezing, rhonchi or rales.  ?Abdominal:  ?   Palpations: Abdomen is soft.  ?   Tenderness: There is abdominal tenderness (diffuse abdominal tenderness without guarding or rebound tenderness.).  ?Musculoskeletal:  ?   Cervical back: Neck supple.  ?Skin: ?   Findings: No rash.  ?Neurological:  ?   Mental Status: She is alert.  ?Psychiatric:     ?   Mood and Affect: Mood normal.  ? ? ?ED Results / Procedures / Treatments   ?Labs ?(all labs ordered are listed, but only abnormal results are displayed) ?Labs Reviewed  ?CBC WITH DIFFERENTIAL/PLATELET - Abnormal; Notable for the following components:  ?    Result Value  ? RBC 5.25 (*)   ? Hemoglobin 16.0 (*)   ? HCT 47.7 (*)   ? All other components within normal limits  ?COMPREHENSIVE METABOLIC PANEL - Abnormal; Notable for the following components:  ? Alkaline Phosphatase 149 (*)   ? All other components within normal limits  ?URINALYSIS, ROUTINE W REFLEX MICROSCOPIC - Abnormal; Notable for  the following components:  ? Color, Urine STRAW (*)   ? Specific Gravity, Urine 1.003 (*)   ? Leukocytes,Ua TRACE (*)   ? All other components within normal limits  ?LIPASE, BLOOD  ? ? ?EKG ?None ? ?Radiology ?No results found. ? ?Procedures ?Procedures  ? ? ?Medications Ordered in ED ?Medications  ?pantoprazole (PROTONIX) EC tablet 20 mg (20 mg Oral Given 12/04/21 1857)  ?sodium chloride 0.9 % bolus 1,000 mL (0 mLs Intravenous Stopped 12/04/21 1827)  ?ondansetron [facility](ZOFRAN) injection 4 mg (4 mg Intravenous Given 12/04/21 1521)  ?morphine (PF) 4 MG/ML injection 4 mg (4 mg Intravenous Given 12/04/21 1522)  ?ondansetron [facility](ZOFRAN) injection 4 mg (4 mg Intravenous Given 12/04/21 1858)  ? ? ?ED Course/ Medical Decision Making/ A&P ?  ?                        ?  Medical Decision Making ?Amount and/or Complexity of Data Reviewed ?Labs: ordered. ? ?Risk ?Prescription drug management. ? ? ?BP 129/70 (BP Location: Left Arm)   Pulse (!) 108   Temp 97.9 ?F (36.6 ?C) (Oral)   Resp 16   SpO2 99%  ? ?2:29 PM ?Patient here with nausea vomiting diarrhea ongoing for the past 2 weeks.  Also endorsed abdominal cramping.  Does have history of chronic opiate use and history of pancreatitis but denies any recent alcohol or drug use or recent sick contact.  Work-up initiated. ? ?4:27 PM ?Labs obtained and independently reviewed interpreted by me.  Normal WBC, normal electrolyte panel, normal lipase, UA is currently pending. ?I suspect her symptoms may be due to IBS.  Have low suspicion for pancreatitis, appendicitis, cholecystitis has been has had cholecystectomy.  Doubt colitis or diverticulitis at this moment given the duration of her symptoms and reassuring labs.  After discussion with patient, plan to discharge home with Phenergan rectal suppository as patient report improvement of her symptoms with such treatment in the past.  Encourage patient to follow-up with GI specialist for further care. ? ? ?This patient presents to the ED for  concern of abd pain, this involves an extensive number of treatment options, and is a complaint that carries with it a high risk of complications and morbidity.  The differential diagnosis includes pancreatitis, colitis, divert

## 2021-12-04 NOTE — ED Notes (Signed)
Discharge instructions including prescription and follow up care with GI discussed with pot. Pt verbalized understanding with no questions at this time. ?

## 2022-03-15 ENCOUNTER — Emergency Department (HOSPITAL_COMMUNITY)
Admission: EM | Admit: 2022-03-15 | Discharge: 2022-03-15 | Discharge status: Against Medical Advice | Payer: Medicaid Other | Attending: Emergency Medicine | Admitting: Emergency Medicine

## 2022-03-15 ENCOUNTER — Encounter (HOSPITAL_COMMUNITY): Payer: Self-pay | Admitting: Emergency Medicine

## 2022-03-15 ENCOUNTER — Emergency Department (HOSPITAL_COMMUNITY): Payer: Medicaid Other

## 2022-03-15 ENCOUNTER — Other Ambulatory Visit: Payer: Self-pay

## 2022-03-15 DIAGNOSIS — R112 Nausea with vomiting, unspecified: Secondary | ICD-10-CM | POA: Insufficient documentation

## 2022-03-15 DIAGNOSIS — R1031 Right lower quadrant pain: Secondary | ICD-10-CM | POA: Insufficient documentation

## 2022-03-15 DIAGNOSIS — R197 Diarrhea, unspecified: Secondary | ICD-10-CM | POA: Diagnosis not present

## 2022-03-15 LAB — COMPREHENSIVE METABOLIC PANEL
ALT: 20 U/L (ref 0–44)
AST: 24 U/L (ref 15–41)
Albumin: 4.1 g/dL (ref 3.5–5.0)
Alkaline Phosphatase: 143 U/L — ABNORMAL HIGH (ref 38–126)
Anion gap: 9 (ref 5–15)
BUN: 7 mg/dL (ref 6–20)
CO2: 27 mmol/L (ref 22–32)
Calcium: 9.4 mg/dL (ref 8.9–10.3)
Chloride: 106 mmol/L (ref 98–111)
Creatinine, Ser: 0.85 mg/dL (ref 0.44–1.00)
GFR, Estimated: >60 mL/min (ref >60)
Glucose, Bld: 127 mg/dL — ABNORMAL HIGH (ref 70–99)
Potassium: 3.9 mmol/L (ref 3.5–5.1)
Sodium: 142 mmol/L (ref 135–145)
Total Bilirubin: 0.6 mg/dL (ref 0.3–1.2)
Total Protein: 7.1 g/dL (ref 6.5–8.1)

## 2022-03-15 LAB — CBC
HCT: 43.8 % (ref 36.0–46.0)
Hemoglobin: 14.4 g/dL (ref 12.0–15.0)
MCH: 29.9 pg (ref 26.0–34.0)
MCHC: 32.9 g/dL (ref 30.0–36.0)
MCV: 91.1 fL (ref 80.0–100.0)
Platelets: 262 10*3/uL (ref 150–400)
RBC: 4.81 MIL/uL (ref 3.87–5.11)
RDW: 12.7 % (ref 11.5–15.5)
WBC: 5.4 10*3/uL (ref 4.0–10.5)
nRBC: 0 % (ref 0.0–0.2)

## 2022-03-15 LAB — URINALYSIS, ROUTINE W REFLEX MICROSCOPIC
Bacteria, UA: NONE SEEN
Bilirubin Urine: NEGATIVE
Glucose, UA: NEGATIVE mg/dL
Hgb urine dipstick: NEGATIVE
Ketones, ur: NEGATIVE mg/dL
Nitrite: NEGATIVE
Protein, ur: NEGATIVE mg/dL
Specific Gravity, Urine: 1.004 — ABNORMAL LOW (ref 1.005–1.030)
pH: 7 (ref 5.0–8.0)

## 2022-03-15 LAB — PREGNANCY, URINE: Preg Test, Ur: NEGATIVE

## 2022-03-15 LAB — LIPASE, BLOOD: Lipase: 23 U/L (ref 11–51)

## 2022-03-15 MED ORDER — LACTATED RINGERS IV BOLUS
1000.0000 mL | Freq: Once | INTRAVENOUS | Status: AC
  Administered 2022-03-15: 1000 mL via INTRAVENOUS

## 2022-03-15 MED ORDER — IOHEXOL 300 MG/ML  SOLN
100.0000 mL | Freq: Once | INTRAMUSCULAR | Status: AC | PRN
  Administered 2022-03-15: 100 mL via INTRAVENOUS

## 2022-03-15 MED ORDER — FENTANYL CITRATE PF 50 MCG/ML IJ SOSY
50.0000 ug | PREFILLED_SYRINGE | Freq: Once | INTRAMUSCULAR | Status: AC
  Administered 2022-03-15: 50 ug via INTRAVENOUS
  Filled 2022-03-15: qty 1

## 2022-03-15 MED ORDER — PROCHLORPERAZINE EDISYLATE 10 MG/2ML IJ SOLN
10.0000 mg | Freq: Once | INTRAMUSCULAR | Status: AC
Start: 2022-03-15 — End: 2022-03-15
  Administered 2022-03-15: 10 mg via INTRAVENOUS
  Filled 2022-03-15: qty 2

## 2022-05-31 ENCOUNTER — Other Ambulatory Visit: Payer: Self-pay | Admitting: Neurology

## 2022-06-17 ENCOUNTER — Other Ambulatory Visit: Payer: Self-pay | Admitting: Neurology

## 2022-07-06 ENCOUNTER — Other Ambulatory Visit: Payer: Self-pay

## 2022-07-06 ENCOUNTER — Emergency Department (HOSPITAL_COMMUNITY)
Admission: EM | Admit: 2022-07-06 | Discharge: 2022-07-07 | Disposition: A | Discharge status: Home / Self Care | Payer: Medicaid Other | Attending: Emergency Medicine | Admitting: Emergency Medicine

## 2022-07-06 ENCOUNTER — Encounter (HOSPITAL_COMMUNITY): Payer: Self-pay

## 2022-07-06 DIAGNOSIS — K2950 Unspecified chronic gastritis without bleeding: Secondary | ICD-10-CM | POA: Insufficient documentation

## 2022-07-06 DIAGNOSIS — E876 Hypokalemia: Secondary | ICD-10-CM | POA: Diagnosis not present

## 2022-07-06 DIAGNOSIS — R1084 Generalized abdominal pain: Secondary | ICD-10-CM | POA: Insufficient documentation

## 2022-07-06 DIAGNOSIS — R112 Nausea with vomiting, unspecified: Secondary | ICD-10-CM | POA: Diagnosis present

## 2022-07-06 LAB — URINALYSIS, ROUTINE W REFLEX MICROSCOPIC
Bilirubin Urine: NEGATIVE
Glucose, UA: NEGATIVE mg/dL
Ketones, ur: NEGATIVE mg/dL
Leukocytes,Ua: NEGATIVE
Nitrite: NEGATIVE
Protein, ur: NEGATIVE mg/dL
Specific Gravity, Urine: 1.01 (ref 1.005–1.030)
pH: 6 (ref 5.0–8.0)

## 2022-07-06 LAB — COMPREHENSIVE METABOLIC PANEL
ALT: 18 U/L (ref 0–44)
AST: 33 U/L (ref 15–41)
Albumin: 3.3 g/dL — ABNORMAL LOW (ref 3.5–5.0)
Alkaline Phosphatase: 149 U/L — ABNORMAL HIGH (ref 38–126)
Anion gap: 6 (ref 5–15)
BUN: <5 mg/dL — ABNORMAL LOW (ref 6–20)
CO2: 29 mmol/L (ref 22–32)
Calcium: 8.8 mg/dL — ABNORMAL LOW (ref 8.9–10.3)
Chloride: 106 mmol/L (ref 98–111)
Creatinine, Ser: 0.8 mg/dL (ref 0.44–1.00)
GFR, Estimated: >60 mL/min (ref >60)
Glucose, Bld: 90 mg/dL (ref 70–99)
Potassium: 3.3 mmol/L — ABNORMAL LOW (ref 3.5–5.1)
Sodium: 141 mmol/L (ref 135–145)
Total Bilirubin: 0.6 mg/dL (ref 0.3–1.2)
Total Protein: 6.3 g/dL — ABNORMAL LOW (ref 6.5–8.1)

## 2022-07-06 LAB — CBC WITH DIFFERENTIAL/PLATELET
Abs Immature Granulocytes: 0.04 10*3/uL (ref 0.00–0.07)
Basophils Absolute: 0.1 10*3/uL (ref 0.0–0.1)
Basophils Relative: 1 %
Eosinophils Absolute: 0.2 10*3/uL (ref 0.0–0.5)
Eosinophils Relative: 2 %
HCT: 44.7 % (ref 36.0–46.0)
Hemoglobin: 15.1 g/dL — ABNORMAL HIGH (ref 12.0–15.0)
Immature Granulocytes: 1 %
Lymphocytes Relative: 30 %
Lymphs Abs: 2.5 10*3/uL (ref 0.7–4.0)
MCH: 30 pg (ref 26.0–34.0)
MCHC: 33.8 g/dL (ref 30.0–36.0)
MCV: 88.9 fL (ref 80.0–100.0)
Monocytes Absolute: 0.7 10*3/uL (ref 0.1–1.0)
Monocytes Relative: 9 %
Neutro Abs: 4.9 10*3/uL (ref 1.7–7.7)
Neutrophils Relative %: 57 %
Platelets: 221 10*3/uL (ref 150–400)
RBC: 5.03 MIL/uL (ref 3.87–5.11)
RDW: 12.3 % (ref 11.5–15.5)
WBC: 8.4 10*3/uL (ref 4.0–10.5)
nRBC: 0 % (ref 0.0–0.2)

## 2022-07-06 LAB — LIPASE, BLOOD: Lipase: 22 U/L (ref 11–51)

## 2022-07-06 MED ORDER — ONDANSETRON 8 MG PO TBDP
8.0000 mg | ORAL_TABLET | Freq: Once | ORAL | Status: AC
  Administered 2022-07-06: 8 mg via ORAL
  Filled 2022-07-06: qty 1

## 2022-07-06 NOTE — ED Notes (Signed)
I attempted to collect labs twice and was unsuccessful.  Nurse tried to collect labs too and was unsuccessful

## 2022-07-06 NOTE — ED Notes (Signed)
Needs IV for CT  

## 2022-07-06 NOTE — ED Provider Triage Note (Signed)
Emergency Medicine Provider Triage Evaluation Note  Tricia Brown , a 46 y.o. female  was evaluated in triage.  Pt complains of abdominal pain, vomiting, diarrhea. Onset Friday, history of chronic pancreatitis, called her GI who ordered a CT with PO contrast for today but was unable to keep the contrast down.   Review of Systems  Positive: As above Negative: fevers  Physical Exam  BP 121/79   Pulse 90   Temp 98.3 F (36.8 C) (Oral)   Resp 17   SpO2 99%  Gen:   Awake, no distress   Resp:  Normal effort  MSK:   Moves extremities without difficulty  Other:    Medical Decision Making  Medically screening exam initiated at 3:14 PM.  Appropriate orders placed.  Burgandy Hackworth was informed that the remainder of the evaluation will be completed by another provider, this initial triage assessment does not replace that evaluation, and the importance of remaining in the ED until their evaluation is complete.     Tacy Learn, PA-C 07/06/22 1515

## 2022-07-06 NOTE — ED Notes (Signed)
ED Provider at bedside. 

## 2022-07-06 NOTE — ED Triage Notes (Signed)
Pt states she has chronic pancreatitis, with a hx of gastritis and IBS. Pt c/o N/V/D x4 days. Pt also c/o abdominal pain x4 days.

## 2022-07-07 ENCOUNTER — Encounter (HOSPITAL_COMMUNITY): Payer: Self-pay

## 2022-07-07 ENCOUNTER — Emergency Department (HOSPITAL_COMMUNITY): Payer: Medicaid Other

## 2022-07-07 MED ORDER — MORPHINE SULFATE (PF) 4 MG/ML IV SOLN
4.0000 mg | Freq: Once | INTRAVENOUS | Status: AC
  Administered 2022-07-07: 4 mg via INTRAVENOUS
  Filled 2022-07-07: qty 1

## 2022-07-07 MED ORDER — KETOROLAC TROMETHAMINE 15 MG/ML IJ SOLN
15.0000 mg | Freq: Once | INTRAMUSCULAR | Status: AC
  Administered 2022-07-07: 15 mg via INTRAVENOUS
  Filled 2022-07-07: qty 1

## 2022-07-07 MED ORDER — ONDANSETRON 4 MG PO TBDP: 4.0000 mg | ORAL_TABLET | Freq: Three times a day (TID) | ORAL | 0 refills | Status: AC | PRN

## 2022-07-07 MED ORDER — IOHEXOL 300 MG/ML  SOLN
100.0000 mL | Freq: Once | INTRAMUSCULAR | Status: AC | PRN
  Administered 2022-07-07: 100 mL via INTRAVENOUS

## 2022-07-07 MED ORDER — LACTATED RINGERS IV BOLUS
500.0000 mL | Freq: Once | INTRAVENOUS | Status: AC
  Administered 2022-07-07: 500 mL via INTRAVENOUS

## 2022-07-07 MED ORDER — SODIUM CHLORIDE (PF) 0.9 % IJ SOLN
INTRAMUSCULAR | Status: AC
  Filled 2022-07-07: qty 50

## 2022-07-07 NOTE — ED Provider Notes (Signed)
[facility] DEPT Provider Note   CSN: [id number] Arrival date & time: 07/06/22  1350     History  Chief Complaint  Patient presents with   Abdominal Pain   Emesis   Diarrhea    Tricia Brown is a 46 y.o. female with history of chronic pancreatitis and gastritis who presents with concern for 4 days of nausea vomiting and diarrhea increased from baseline.  Patient has chronic diarrhea with pancreatitis, also has chronic epigastric pain secondary to gastritis but states that she has been unable to tolerate food for the last 4 days.  Unable to tolerate her normal Carafate.  She is taking her Protonix and her Zofran at home.  Greater than 10 those of NBNB emesis today, 5 episodes of watery diarrhea today.  Patient chronically on low-dose nitrofurantoin per urology for prevention of UTIs given context of chronic diarrhea.  States that she has had 2 days urinary frequency urgency and dysuria.  I personally reviewed her medical records.  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  HPI     Home Medications Prior to Admission medications   Medication Sig Start Date End Date Taking? Authorizing Provider  ondansetron (ZOFRAN-ODT) 4 MG disintegrating tablet Take 1 tablet (4 mg total) by mouth every 8 (eight) hours as needed for nausea or vomiting. 07/07/22  Yes Mischa Brittingham, Gypsy Balsam, PA-C  budesonide-formoterol (SYMBICORT) 80-4.5 MCG/ACT inhaler Inhale 2 puffs into the lungs in the morning and at bedtime.    [provider]  DULoxetine (CYMBALTA) 60 MG capsule Take 60 mg by mouth daily.    [provider]  ferrous sulfate 324 MG TBEC Take 324 mg by mouth daily with breakfast.    [provider]  fluorometholone (FML) 0.1 % ophthalmic suspension Place 1 drop into both eyes 2 (two) times daily. 05/23/21   [provider]  gabapentin (NEURONTIN) 300 MG  capsule Take 2 capsules (600 mg total) by mouth 3 (three) times daily. Patient taking differently: Take 300 mg by mouth 3 (three) times daily. 06/11/21 08/16/22  [name], [name], DO  Galcanezumab-gnlm (EMGALITY) 120 MG/ML SOAJ Inject 120 mLs into the skin every 30 (thirty) days.    [provider]  lipase/protease/amylase (CREON) 36000 UNITS CPEP capsule Take 36,000 Units by mouth See admin instructions. 108000 units oral with meals 36000 units with snacks    [provider]  Multiple Vitamin (MULTIVITAMIN WITH MINERALS) TABS tablet Take 1 tablet by mouth daily. 09/04/21   [name], DO  pantoprazole (PROTONIX) 40 MG tablet Take 1 tablet (40 mg total) by mouth daily. 06/01/21 08/16/22  [name], [name], MD  PROAIR HFA 108 [phone] Base) MCG/ACT inhaler Inhale 2 puffs into the lungs every 6 (six) hours as needed for wheezing or shortness of breath.    [provider]  promethazine (PHENERGAN) 25 MG suppository Place 1 suppository (25 mg total) rectally every 6 (six) hours as needed for nausea or vomiting. 12/04/21   Domenic Moras, PA-C  RESTASIS 0.05 % ophthalmic emulsion Place 1 drop into both eyes 2 (two) times daily. 05/25/21   [provider]  rizatriptan (MAXALT-MLT) 10 MG disintegrating tablet TAKE 1 TABLET BY MOUTH AS NEEDED FOR MIGRAINE. MAY REPEAT IN 2 HOURS IF NEEDED 06/18/22   Melvenia Beam, MD  SUBOXONE 8-2 MG FILM Place 1 Film under the tongue 3 (three) times daily. 02/17/21   [provider]  sucralfate (CARAFATE) 1 g tablet Take 1 tablet (1 g total) by mouth 3 (three) times daily. 09/03/21   [name], DO  valACYclovir (VALTREX) 500 MG tablet Take 500 mg by mouth daily as needed (as directed, for outbreaks). Patient not taking: Reported on 09/12/2021 07/27/20   [provider]      Allergies    Patient has no known allergies.    Review of Systems   Review of Systems  Constitutional:  Positive for activity change, appetite change and  fatigue. Negative for chills and fever.  HENT: Negative.    Respiratory: Negative.    Cardiovascular: Negative.   Gastrointestinal:  Positive for abdominal pain, diarrhea, nausea and vomiting.  Genitourinary:  Positive for dysuria, frequency and urgency. Negative for decreased urine volume, vaginal bleeding, vaginal discharge and vaginal pain.    Physical Exam Updated Vital Signs BP 107/69   Pulse 80   Temp 98.3 F (36.8 C)   Resp 14   SpO2 98%  Physical Exam Vitals and nursing note reviewed.  Constitutional:      Appearance: She is not ill-appearing or toxic-appearing.  HENT:     Head: Normocephalic and atraumatic.     Mouth/Throat:     Mouth: Mucous membranes are moist.     Pharynx: No oropharyngeal exudate or posterior oropharyngeal erythema.  Eyes:     General:        Right eye: No discharge.        Left eye: No discharge.     Conjunctiva/sclera: Conjunctivae normal.  Cardiovascular:     Rate and Rhythm: Normal rate and regular rhythm.     Pulses: Normal pulses.     Heart sounds: Normal heart sounds. No murmur heard. Pulmonary:     Effort: Pulmonary effort is normal. No respiratory distress.     Breath sounds: Normal breath sounds. No wheezing or rales.  Abdominal:     General: Bowel sounds are normal. There is no distension.     Palpations: Abdomen is soft.     Tenderness: There is generalized abdominal tenderness and tenderness in the epigastric area. There is no right CVA tenderness, left CVA tenderness, guarding or rebound.  Musculoskeletal:        General: No deformity.     Cervical back: Neck supple.  Skin:    General: Skin is warm and dry.     Capillary Refill: Capillary refill takes less than 2 seconds.  Neurological:     General: No focal deficit present.     Mental Status: She is alert. Mental status is at baseline.  Psychiatric:        Mood and Affect: Mood normal.     ED Results / Procedures / Treatments   Labs (all labs ordered are listed, but  only abnormal results are displayed) Labs Reviewed  CBC WITH DIFFERENTIAL/PLATELET - Abnormal; Notable for the following components:      Result Value   Hemoglobin 15.1 (*)    All other components within normal limits  COMPREHENSIVE METABOLIC PANEL - Abnormal; Notable for the following components:   Potassium 3.3 (*)    BUN <5 (*)    Calcium 8.8 (*)    Total Protein 6.3 (*)    Albumin 3.3 (*)    Alkaline Phosphatase 149 (*)    All other components within normal limits  URINALYSIS, ROUTINE W REFLEX MICROSCOPIC - Abnormal; Notable for the following components:   APPearance CLOUDY (*)    Hgb urine dipstick  MODERATE (*)    Bacteria, UA MANY (*)    All other components within normal limits  URINE CULTURE  LIPASE, BLOOD    EKG None  Radiology CT Abdomen Pelvis W Contrast  Result Date: 07/07/2022 CLINICAL DATA:  Abdominal pain for 4 days; history of gastritis and chronic pancreatitis EXAM: CT ABDOMEN AND PELVIS WITH CONTRAST TECHNIQUE: Multidetector CT imaging of the abdomen and pelvis was performed using the standard protocol following bolus administration of intravenous contrast. RADIATION DOSE REDUCTION: This exam was performed according to the departmental dose-optimization program which includes automated exposure control, adjustment of the mA and/or kV according to patient size and/or use of iterative reconstruction technique. CONTRAST:  17mL OMNIPAQUE IOHEXOL 300 MG/ML  SOLN COMPARISON:  CT abdomen and pelvis 03/15/2022 FINDINGS: Lower chest: No acute abnormality. Hepatobiliary: Hepatic steatosis. No suspicious focal lesion. Cholecystectomy. No biliary dilation. Pancreas: Mild atrophy and prominence of the main pancreatic duct, unchanged. Small punctate calcification in the head of the pancreas compatible with sequela of chronic pancreatitis. Spleen: Unremarkable. Adrenals/Urinary Tract: Adrenal glands are unremarkable. Kidneys are normal, without renal calculi, focal lesion, or  hydronephrosis. Bladder is unremarkable. Stomach/Bowel: Stomach is within normal limits. Appendix appears normal. No evidence of bowel wall thickening, distention, or inflammatory changes. Vascular/Lymphatic: No significant vascular findings are present. No enlarged abdominal or pelvic lymph nodes. Reproductive: Hysterectomy. Other: No free intraperitoneal fluid or air. Musculoskeletal: No acute osseous abnormality. IMPRESSION: No acute abnormality in the abdomen or pelvis. Hepatic steatosis. Electronically Signed   By: Placido Sou M.D.   On: 07/07/2022 01:26    Procedures Procedures    Medications Ordered in ED Medications  sodium chloride (PF) 0.9 % injection (  Not Given 07/07/22 0135)  ketorolac (TORADOL) 15 MG/ML injection 15 mg (has no administration in time range)  ondansetron (ZOFRAN-ODT) disintegrating tablet 8 mg (8 mg Oral Given 07/06/22 2304)  morphine (PF) 4 MG/ML injection 4 mg (4 mg Intravenous Given 07/07/22 0028)  iohexol (OMNIPAQUE) 300 MG/ML solution 100 mL (100 mLs Intravenous Contrast Given 07/07/22 0106)  lactated ringers bolus 500 mL (500 mLs Intravenous New Bag/Given 07/07/22 0324)    ED Course/ Medical Decision Making/ A&P                           Medical Decision Making 46 year old female with chronic pancreatitis presents with nausea vomiting diarrhea increase from baseline has 3 days.  Vital signs are normal and intake.  Cardiopulmonary exam is normal, abdominal exam is as above with generalized palpation worse in the epigastrium.  Lungs CTA B.  No CVAT.  Diagnosis includes but is not limited to ACS, PE, pleural effusion, pneumonia, pneumothorax, gastritis, GERD, pancreatitis, infectious gastroenteritis, urinary tract infection  Amount and/or Complexity of Data Reviewed Labs: ordered.    Details: CBC without leukocytosis with mild elevated hemoglobin of 15, suspect hemoconcentration.  CMP with hypokalemia of 3.3, elevated alk phos to 149, normal creatinine  and LFTs.  Lipase is normal, UA with moderate hemoglobin and many bacteria, 6-10 WBCs and 21-50 squamous epithelial cells.  Risk Prescription drug management.   Patient reevaluated with improvement in her pain.  Clinical picture most consistent with patient's known gastritis as etiology for her discomfort.  Recommend continuation of her PPI at home, Carafate, will refill her Zofran as she is run out.  Recommend close follow-up with her gastroenterologist.  Patient is feeling much improved, tolerating p.o. at this time.  No further work-up warranted in the  ER.  Clinical concern for emergent underlying etiology that warrant further ED work-up or inpatient management exceedingly low though admission was considered.  Does not seem indicated at this time.  Mylinh  voiced understanding of her medical evaluation and treatment plan. Each of their questions answered to their expressed satisfaction.  Return precautions were given.  Patient is well-appearing, stable, and was discharged in good condition.   This chart was dictated using voice recognition software, Dragon. Despite the best efforts of this provider to proofread and correct errors, errors may still occur which can change documentation meaning.   Final Clinical Impression(s) / ED Diagnoses Final diagnoses:  Chronic gastritis, presence of bleeding unspecified, unspecified gastritis type    Rx / DC Orders ED Discharge Orders          Ordered    ondansetron (ZOFRAN-ODT) 4 MG disintegrating tablet  Every 8 hours PRN        07/07/22 0307              Read Bonelli, Gypsy Balsam, PA-C 07/07/22 0339    Quintella Reichert, MD 07/07/22 [phone]

## 2022-07-07 NOTE — Discharge Instructions (Addendum)
Your workup today was reassuring. It is likely your gastritis is the source of your recent discomfort. Please continue taking your prescribed medications and schedule a follow up with your gastroenterologist. Return to the ER with any new severe symptoms.

## 2022-07-07 NOTE — ED Notes (Signed)
Call placed to lab to add urine culture to previous collection  

## 2022-07-08 LAB — URINE CULTURE

## 2022-07-31 LAB — PROINSULIN/INSULIN RATIO
Insulin: 17 u[IU]/mL
Proinsulin: 10.8 pmol/L — ABNORMAL HIGH

## 2022-09-13 IMAGING — CT CT ABD-PEL WO/W CM
3 of 12 series · 11 of 46 positions shown, 17 images · IV contrast (APPLIED)
Comparison: 07/15/2020 the.

CLINICAL DATA: Follow-up pancreatitis.

EXAM:
CT ABDOMEN AND PELVIS WITHOUT AND WITH CONTRAST
TECHNIQUE: Multidetector CT imaging of the abdomen and pelvis was performed
following the standard protocol before and following the bolus
administration of intravenous contrast.
CONTRAST:  100mL OMNIPAQUE IOHEXOL 300 MG/ML  SOLN

[Series 8: cor arterial · coronal · arterial · 0.54mm/px · 3 of 72 slices shown, 4 images]
[im 18/72  soft-tissue]
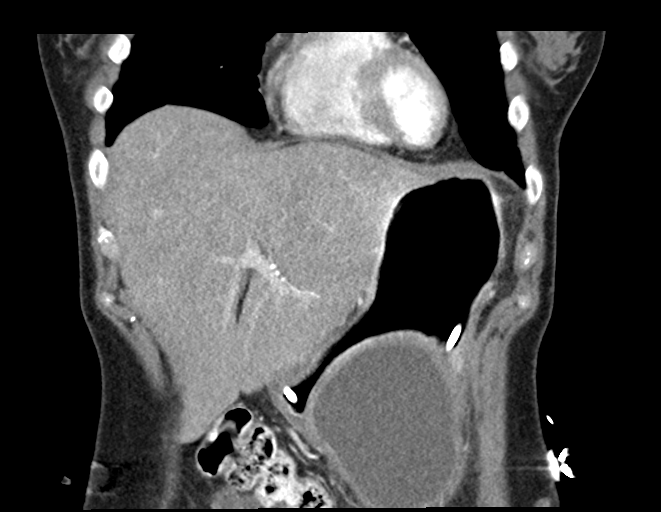
[im 36/72  soft-tissue]
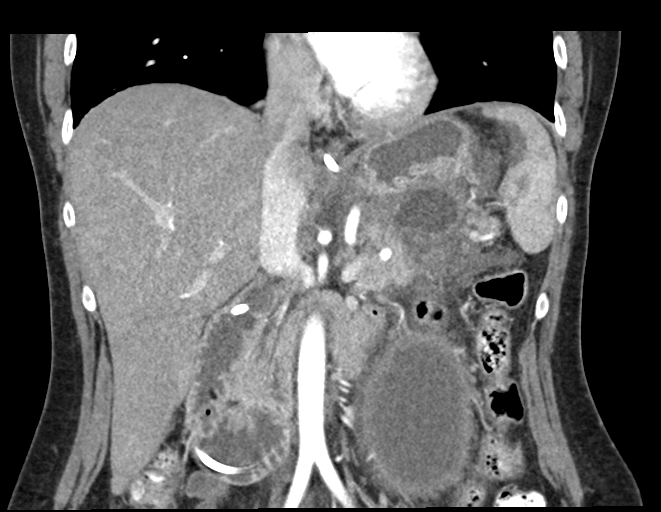
[im 36/72  bone]
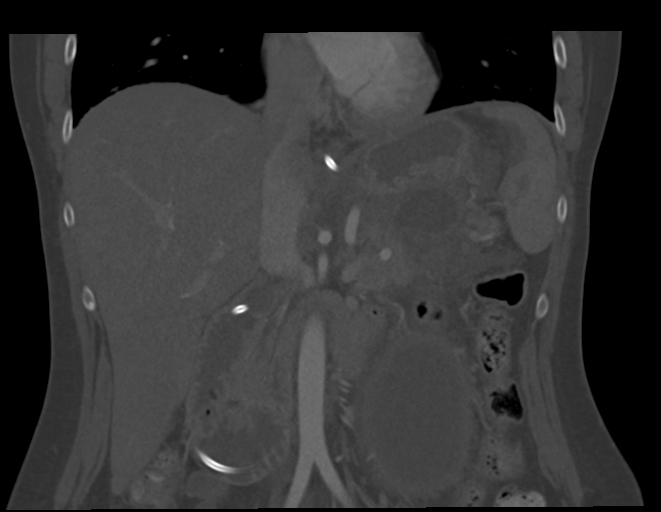
[im 54/72  soft-tissue]
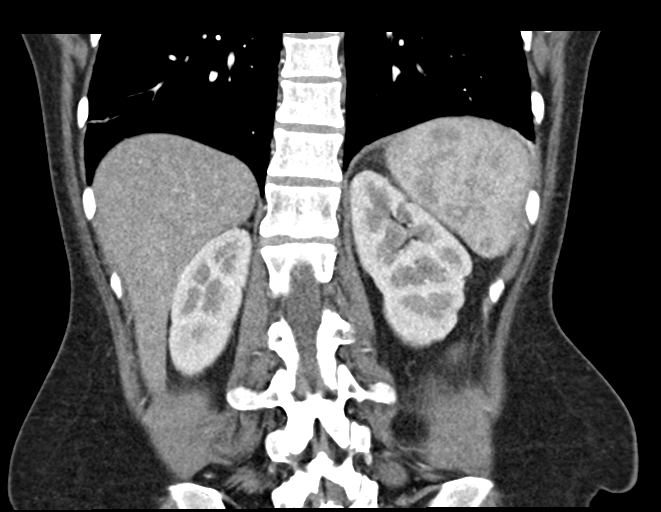

[Series 10: pancreas portal venous · axial · portal-venous · 0.65mm/px · z∈[+893,+1202]mm · 5 of 155 slices shown, 10 images]
[im 26/155  soft-tissue]
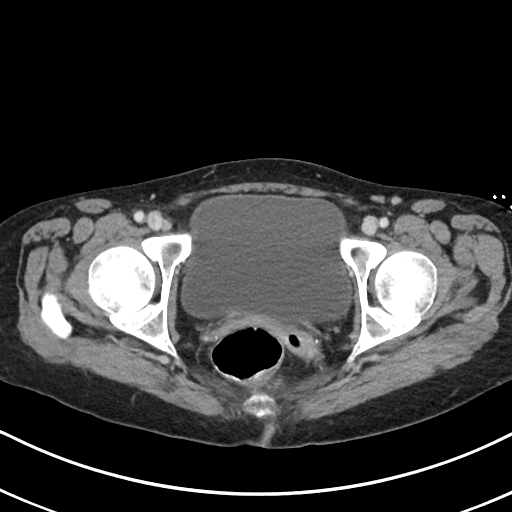
[im 26/155  bone]
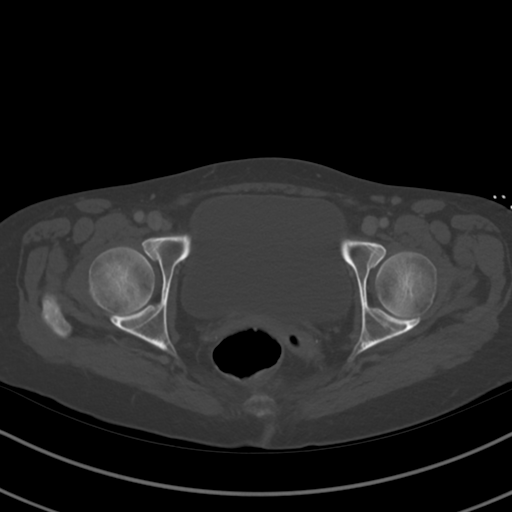
[im 52/155  soft-tissue]
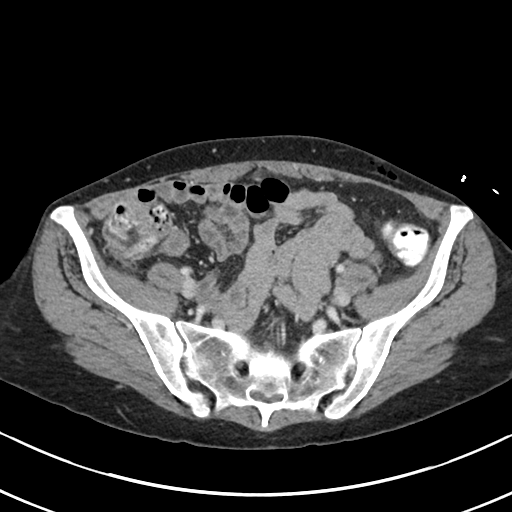
[im 52/155  lung]
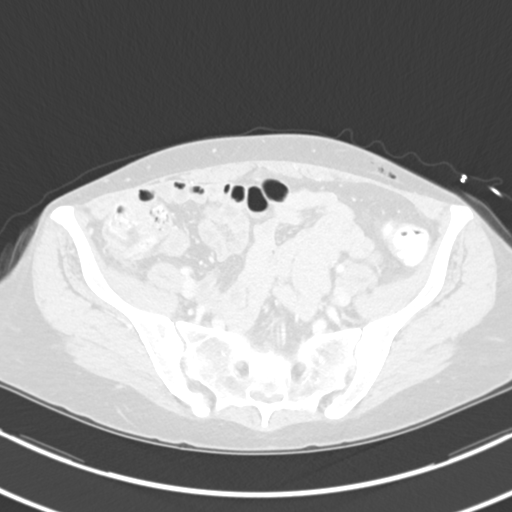
[im 78/155  soft-tissue]
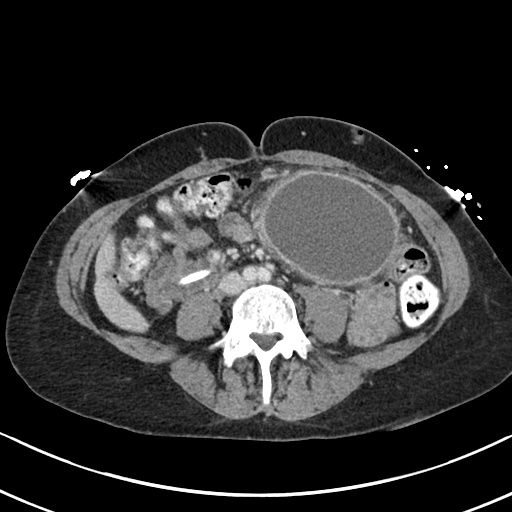
[im 78/155  lung]
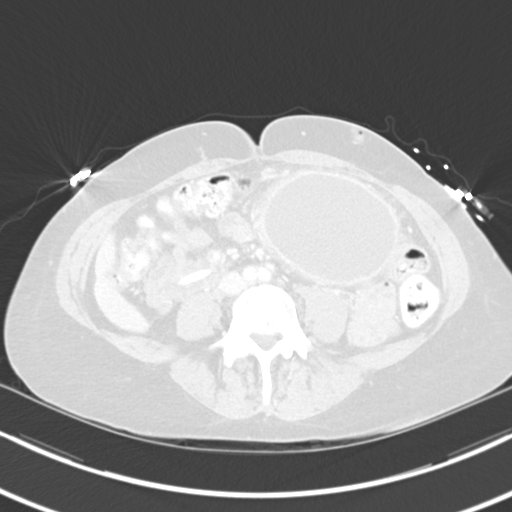
[im 103/155  soft-tissue]
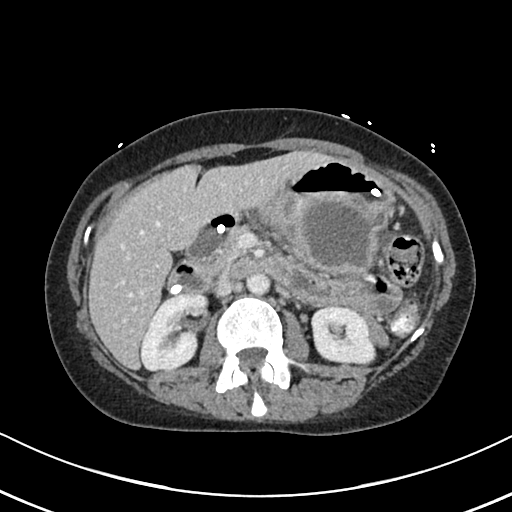
[im 103/155  lung]
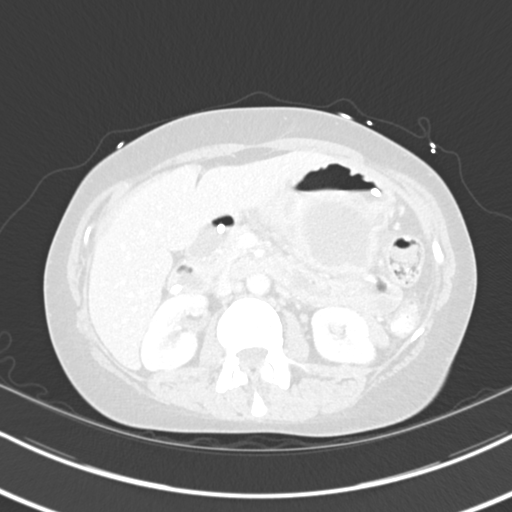
[im 129/155  soft-tissue]
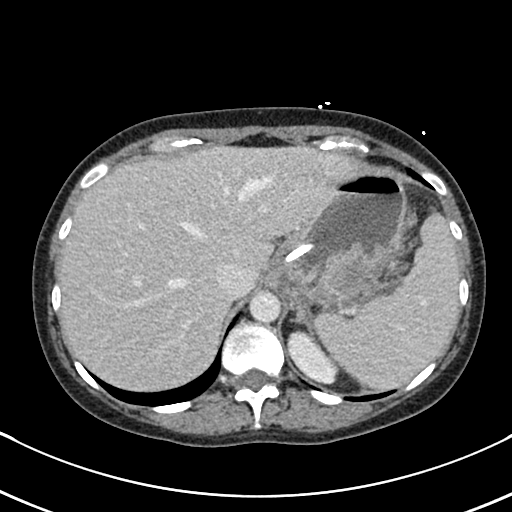
[im 129/155  lung]
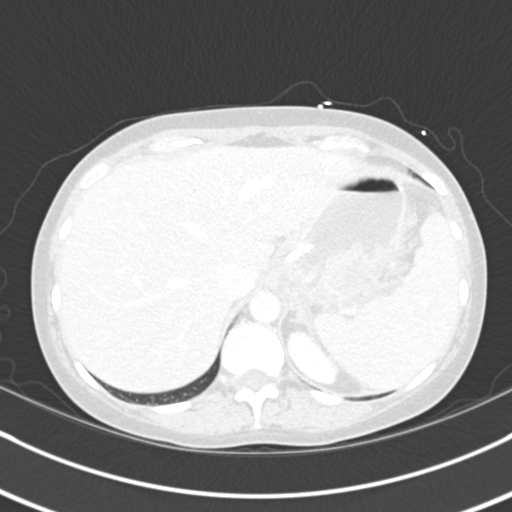

[Series 11: portal venous thins · axial · portal-venous · 0.65mm/px · z∈[+893,+1049]mm · 3 of 155 slices shown]
[im 26/155  soft-tissue]
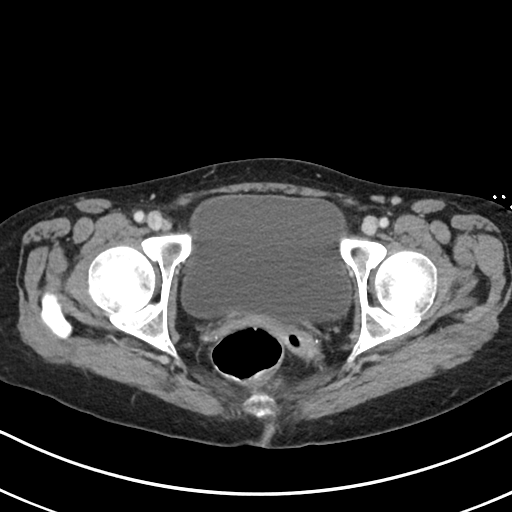
[im 52/155  soft-tissue]
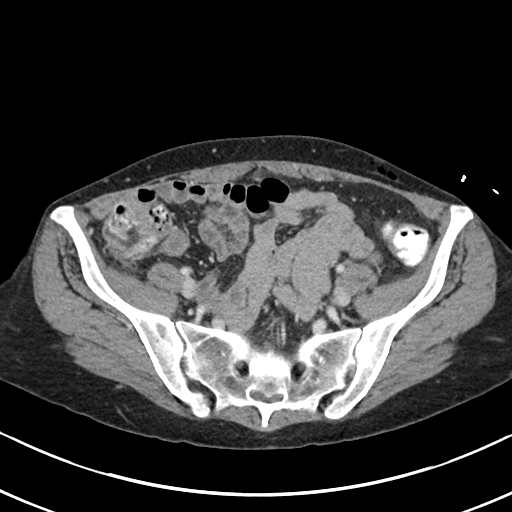
[im 78/155  soft-tissue]
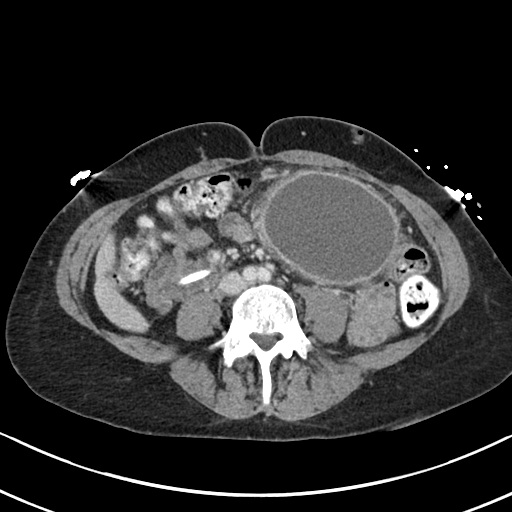

[11 of 46 positions shown; findings below may reference images not displayed]

FINDINGS: Lower chest: Subsegmental atelectasis identified within the lung
bases.

Hepatobiliary: No focal liver abnormality. Status post
cholecystectomy. No biliary ductal dilatation.

Pancreas: Stable to improved appearance of peripancreatic
inflammation. Stable cystic lesion within tail of pancreas measures
1.4 cm, image 42/11. Adjacent cyst projecting off the tail of
pancreas is again noted measuring 1.4 cm, unchanged. Between the
posterior wall of the stomach in tail of pancreas there is a cystic
structure measuring 3.2 cm, image 33/11. Previously this measured
4.1 cm.

The large, well-organized cystic structure within the lesser sac
between the pancreas, stomach and spleen measures 9.9 x 6.8 by
cm, image 67/11. Previously this measured 8.3 x 6.5 by 10.4 cm. No
new fluid collections identified.

Spleen: There is a small perisplenic collection extending along the
medial aspect of the spleen which measures approximately 0.7 cm in
thickness, image [DATE]. No signs of splenic infarct.

Adrenals/Urinary Tract: Normal appearance of the adrenal glands. The
kidneys are unremarkable. No mass or hydronephrosis identified.
Urinary bladder is unremarkable.

Stomach/Bowel: The stomach appears nondistended. No significant
small bowel wall thickening, inflammation or distension. The scratch
set there is mild wall thickening involving the sigmoid colon
without surrounding inflammation. This is nonspecific and may
reflect incomplete distention.

Vascular/Lymphatic: No significant vascular findings are present.
The portal vein, portal venous confluence and splenic vein remain
patent. No enlarged abdominal or pelvic lymph nodes.

Reproductive: Status post hysterectomy. No adnexal masses.

Other: Interval resolution pelvic ascites.

Musculoskeletal: No acute or significant osseous findings.
IMPRESSION: 1. Stable to improved appearance of peripancreatic inflammation.
2. There is been interval increase in size of large, well-organized
pseudo cyst within the lesser sac which extends along the posterior
wall of the stomach. The smaller pseudocysts within and around the
tail of pancreas are unchanged.
3. Interval resolution of pelvic ascites.
4. Mild wall thickening involving the sigmoid colon without
surrounding inflammation. This is nonspecific and may reflect
incomplete distention. Correlate for any clinical signs or symptoms
of colitis.

## 2022-09-16 ENCOUNTER — Other Ambulatory Visit: Payer: Self-pay

## 2022-09-16 ENCOUNTER — Encounter (HOSPITAL_COMMUNITY): Payer: Self-pay

## 2022-09-16 ENCOUNTER — Emergency Department (HOSPITAL_COMMUNITY): Payer: Medicaid Other

## 2022-09-16 ENCOUNTER — Emergency Department (HOSPITAL_COMMUNITY)
Admission: EM | Admit: 2022-09-16 | Discharge: 2022-09-16 | Disposition: A | Discharge status: Home / Self Care | Payer: Medicaid Other | Attending: Emergency Medicine | Admitting: Emergency Medicine

## 2022-09-16 DIAGNOSIS — K2900 Acute gastritis without bleeding: Secondary | ICD-10-CM | POA: Insufficient documentation

## 2022-09-16 DIAGNOSIS — E876 Hypokalemia: Secondary | ICD-10-CM | POA: Diagnosis not present

## 2022-09-16 DIAGNOSIS — R079 Chest pain, unspecified: Secondary | ICD-10-CM | POA: Diagnosis present

## 2022-09-16 DIAGNOSIS — I4510 Unspecified right bundle-branch block: Secondary | ICD-10-CM | POA: Diagnosis not present

## 2022-09-16 LAB — HEPATIC FUNCTION PANEL
ALT: 22 U/L (ref 0–44)
AST: 36 U/L (ref 15–41)
Albumin: 3.2 g/dL — ABNORMAL LOW (ref 3.5–5.0)
Alkaline Phosphatase: 151 U/L — ABNORMAL HIGH (ref 38–126)
Bilirubin, Direct: 0.2 mg/dL (ref 0.0–0.2)
Indirect Bilirubin: 0.3 mg/dL (ref 0.3–0.9)
Total Bilirubin: 0.5 mg/dL (ref 0.3–1.2)
Total Protein: 6.1 g/dL — ABNORMAL LOW (ref 6.5–8.1)

## 2022-09-16 LAB — BASIC METABOLIC PANEL
Anion gap: 8 (ref 5–15)
BUN: <5 mg/dL — ABNORMAL LOW (ref 6–20)
CO2: 29 mmol/L (ref 22–32)
Calcium: 8.5 mg/dL — ABNORMAL LOW (ref 8.9–10.3)
Chloride: 103 mmol/L (ref 98–111)
Creatinine, Ser: 0.93 mg/dL (ref 0.44–1.00)
GFR, Estimated: >60 mL/min (ref >60)
Glucose, Bld: 123 mg/dL — ABNORMAL HIGH (ref 70–99)
Potassium: 2.2 mmol/L — CL (ref 3.5–5.1)
Sodium: 140 mmol/L (ref 135–145)

## 2022-09-16 LAB — CBC
HCT: 39.8 % (ref 36.0–46.0)
Hemoglobin: 13.4 g/dL (ref 12.0–15.0)
MCH: 30.1 pg (ref 26.0–34.0)
MCHC: 33.7 g/dL (ref 30.0–36.0)
MCV: 89.4 fL (ref 80.0–100.0)
Platelets: 276 10*3/uL (ref 150–400)
RBC: 4.45 MIL/uL (ref 3.87–5.11)
RDW: 13 % (ref 11.5–15.5)
WBC: 6 10*3/uL (ref 4.0–10.5)
nRBC: 0 % (ref 0.0–0.2)

## 2022-09-16 LAB — LIPASE, BLOOD: Lipase: 36 U/L (ref 11–51)

## 2022-09-16 LAB — TROPONIN I (HIGH SENSITIVITY)
Troponin I (High Sensitivity): 3 ng/L (ref <18)
Troponin I (High Sensitivity): 3 ng/L (ref <18)

## 2022-09-16 MED ORDER — SUCRALFATE 1 GM/10ML PO SUSP: 1.0000 g | Freq: Three times a day (TID) | ORAL | 0 refills | Status: AC

## 2022-09-16 MED ORDER — FAMOTIDINE 20 MG PO TABS
20.0000 mg | ORAL_TABLET | Freq: Once | ORAL | Status: AC
  Administered 2022-09-16: 20 mg via ORAL
  Filled 2022-09-16: qty 1

## 2022-09-16 MED ORDER — ONDANSETRON 4 MG PO TBDP
4.0000 mg | ORAL_TABLET | Freq: Once | ORAL | Status: AC
  Administered 2022-09-16: 4 mg via ORAL
  Filled 2022-09-16: qty 1

## 2022-09-16 MED ORDER — ALUM & MAG HYDROXIDE-SIMETH 200-200-20 MG/5ML PO SUSP
30.0000 mL | Freq: Once | ORAL | Status: AC
  Administered 2022-09-16: 30 mL via ORAL
  Filled 2022-09-16: qty 30

## 2022-09-16 MED ORDER — FAMOTIDINE 20 MG PO TABS: 20.0000 mg | ORAL_TABLET | Freq: Two times a day (BID) | ORAL | 0 refills | Status: AC

## 2022-09-16 MED ORDER — POTASSIUM CHLORIDE CRYS ER 20 MEQ PO TBCR
40.0000 meq | EXTENDED_RELEASE_TABLET | Freq: Once | ORAL | Status: AC
  Administered 2022-09-16: 40 meq via ORAL
  Filled 2022-09-16: qty 2

## 2022-09-16 MED ORDER — LIDOCAINE VISCOUS HCL 2 % MT SOLN
15.0000 mL | Freq: Once | OROMUCOSAL | Status: AC
  Administered 2022-09-16: 15 mL via ORAL
  Filled 2022-09-16: qty 15

## 2022-09-16 NOTE — ED Triage Notes (Signed)
Patient states CP since last night and threw up this morning while brushing her teeth. Hx of chronic pancreatitis with gastritis and complains of left sided chest pain.

## 2022-09-16 NOTE — Discharge Instructions (Addendum)
Concerns your chest pain is likely secondary to acid reflux from GERD versus gastritis.  Recommendations to stay away from spicy food, chocolate, or caffeine.  Eat a bland diet.  Prescriptions for Pepcid and Carafate sent to your pharmacy for symptomatic management.

## 2022-09-16 NOTE — ED Provider Notes (Addendum)
[facility] EMERGENCY DEPARTMENT Provider Note   CSN: [id number] Arrival date & time: 09/16/22  1329     History  Chief Complaint  Patient presents with   Chest Pain    Tricia Brown is a 46 y.o. female.  Patient is a 46 year old female with past medical history of chronic pancreatitis and gastritis presenting for complaints of chest pain.  Patient admits to sternal and left sternal border chest pain, nonradiating, sharp and burning in nature, constant, x 24 hours.  Admits to associated nausea with vomiting.  She states she frequently gets episodes of chronic pancreatitis however usually has epigastric not chest pain with them.  Does endorse some epigastric abdominal pain. she denies any fevers or chills.  ROS positive for diarrhea.  The history is provided by the patient. No language interpreter was used.  Chest Pain Associated symptoms: abdominal pain, nausea and vomiting   Associated symptoms: no back pain, no cough, no fever, no palpitations and no shortness of breath        Home Medications Prior to Admission medications   Medication Sig Start Date End Date Taking? Authorizing Provider  famotidine (PEPCID) 20 MG tablet Take 1 tablet (20 mg total) by mouth 2 (two) times daily. 09/16/22  Yes Edwin Dada P, DO  ferrous sulfate 324 MG TBEC Take 324 mg by mouth daily with breakfast.   Yes [provider]  fluticasone (FLONASE) 50 MCG/ACT nasal spray Place 1 spray into both nostrils at bedtime. 05/14/22  Yes [provider]  lipase/protease/amylase (CREON) 36000 UNITS CPEP capsule Take 36,000 Units by mouth See admin instructions. 108000 units oral with meals 28786 units with snacks   Yes [provider]  Multiple Vitamin (MULTIVITAMIN WITH MINERALS) TABS tablet Take 1 tablet by mouth daily. 09/04/21  Yes Vann, Jessica U, DO  ondansetron (ZOFRAN) 4 MG tablet Take 4 mg by mouth 3 (three) times daily as needed for nausea or vomiting. 08/30/22  Yes  [provider]  ondansetron (ZOFRAN) 8 MG tablet Take 8 mg by mouth 2 (two) times daily as needed. 08/10/22  Yes [provider]  pantoprazole (PROTONIX) 40 MG tablet Take 1 tablet (40 mg total) by mouth daily. 06/01/21 09/16/22 Yes Long, Arlyss Repress, MD  PREMARIN vaginal cream Place 1 applicator vaginally 2 (two) times a week.   Yes [provider]  promethazine (PHENERGAN) 25 MG suppository Place 1 suppository (25 mg total) rectally every 6 (six) hours as needed for nausea or vomiting. 12/04/21  Yes Fayrene Helper, PA-C  rizatriptan (MAXALT-MLT) 10 MG disintegrating tablet TAKE 1 TABLET BY MOUTH AS NEEDED FOR MIGRAINE. MAY REPEAT IN 2 HOURS IF NEEDED Patient taking differently: Take 10 mg by mouth as needed for migraine. 06/18/22  Yes Anson Fret, MD  SUBOXONE 8-2 MG FILM Place 1 Film under the tongue 3 (three) times daily. 02/17/21  Yes [provider]  sucralfate (CARAFATE) 1 GM/10ML suspension Take 10 mLs (1 g total) by mouth 4 (four) times daily -  with meals and at bedtime. 09/16/22  Yes Edwin Dada P, DO  ondansetron (ZOFRAN-ODT) 4 MG disintegrating tablet Take 1 tablet (4 mg total) by mouth every 8 (eight) hours as needed for nausea or vomiting. Patient not taking: Reported on 09/16/2022 07/07/22   Paris Lore, PA-C      Allergies    Patient has no known allergies.    Review of Systems   Review of Systems  Constitutional:  Negative for chills and fever.  HENT:  Negative for ear pain and sore throat.   Eyes:  Negative for pain and visual disturbance.  Respiratory:  Negative for cough and shortness of breath.   Cardiovascular:  Positive for chest pain. Negative for palpitations.  Gastrointestinal:  Positive for abdominal pain, diarrhea, nausea and vomiting.  Genitourinary:  Negative for dysuria and hematuria.  Musculoskeletal:  Negative for arthralgias and back pain.  Skin:  Negative for color change and rash.  Neurological:  Negative for  seizures and syncope.  All other systems reviewed and are negative.   Physical Exam Updated Vital Signs BP 136/86   Pulse 82   Temp 98 F (36.7 C) (Oral)   Resp 20   Ht 5\' 7"  (1.702 m)   Wt 59 kg   SpO2 100%   BMI 20.36 kg/m  Physical Exam Vitals and nursing note reviewed.  Constitutional:      General: She is not in acute distress.    Appearance: She is well-developed.  HENT:     Head: Normocephalic and atraumatic.  Eyes:     Conjunctiva/sclera: Conjunctivae normal.  Cardiovascular:     Rate and Rhythm: Normal rate and regular rhythm.     Heart sounds: No murmur heard. Pulmonary:     Effort: Pulmonary effort is normal. No respiratory distress.     Breath sounds: Normal breath sounds.  Abdominal:     Palpations: Abdomen is soft.     Tenderness: There is abdominal tenderness in the epigastric area. There is no guarding or rebound.  Musculoskeletal:        General: No swelling.     Cervical back: Neck supple.  Skin:    General: Skin is warm and dry.     Capillary Refill: Capillary refill takes less than 2 seconds.  Neurological:     Mental Status: She is alert.  Psychiatric:        Mood and Affect: Mood normal.     ED Results / Procedures / Treatments   Labs (all labs ordered are listed, but only abnormal results are displayed) Labs Reviewed  BASIC METABOLIC PANEL - Abnormal; Notable for the following components:      Result Value   Potassium 2.2 (*)    Glucose, Bld 123 (*)    BUN <5 (*)    Calcium 8.5 (*)    All other components within normal limits  HEPATIC FUNCTION PANEL - Abnormal; Notable for the following components:   Total Protein 6.1 (*)    Albumin 3.2 (*)    Alkaline Phosphatase 151 (*)    All other components within normal limits  RESP PANEL BY RT-PCR (RSV, FLU A&B, COVID)  [id number]  CBC  LIPASE, BLOOD  TROPONIN I (HIGH SENSITIVITY)  TROPONIN I (HIGH SENSITIVITY)    EKG EKG Interpretation  Date/Time:  Wednesday September 16 2022 13:36:48  EST Ventricular Rate:  89 PR Interval:  120 QRS Duration: 96 QT Interval:  354 QTC Calculation: 430 R Axis:   70 Text Interpretation: Normal sinus rhythm Incomplete right bundle branch block Nonspecific ST abnormality Abnormal ECG No previous ECGs available Confirmed by [id number] ([id number]) on 09/16/2022 3:28:48 PM  Radiology DG Chest 2 View  Result Date: 09/16/2022 CLINICAL DATA:  Chest pain EXAM: CHEST - 2 VIEW COMPARISON:  06/19/21 FINDINGS: No pleural effusion. No pneumothorax. No focal airspace opacity. Normal cardiac mediastinal contours. No displaced rib fractures. Visualized upper abdomen is unremarkable. Vertebral body heights are maintained IMPRESSION: No radiographic finding to explain chest pain. Electronically Signed  By: Lorenza Cambridge M.D.   On: 09/16/2022 14:13    Procedures Procedures    Medications Ordered in ED Medications  potassium chloride SA (KLOR-CON M) CR tablet 40 mEq (40 mEq Oral Given 09/16/22 1654)  famotidine (PEPCID) tablet 20 mg (20 mg Oral Given 09/16/22 1655)  alum & mag hydroxide-simeth (MAALOX/MYLANTA) 200-200-20 MG/5ML suspension 30 mL (30 mLs Oral Given 09/16/22 1656)    And  lidocaine (XYLOCAINE) 2 % viscous mouth solution 15 mL (15 mLs Oral Given 09/16/22 1656)  ondansetron (ZOFRAN-ODT) disintegrating tablet 4 mg (4 mg Oral Given 09/16/22 1654)    ED Course/ Medical Decision Making/ A&P                           Medical Decision Making Amount and/or Complexity of Data Reviewed Labs: ordered. Radiology: ordered.  Risk OTC drugs. Prescription drug management.   44:21 PM 46 year old female with past medical history of chronic pancreatitis and gastritis presenting for complaints of chest pain.  Patient is alert and oriented x 3, no acute distress, afebrile, so vital signs.  Abdomen is soft with minimal tenderness in the epigastric region with deep palpation.  No guarding, rigidity, or distention.  EKG demonstrates partial right bundle blanch  block only.  No ST segment elevation or depression.  No T wave inversions.  Normal axis.  Troponin less than 3.  No suspicion for ACS.  Chronic pancreatitis versus gastritis higher on differential secondary to patient's epigastric abdominal pain.  Maalox, Zofran, and Pepcid given for symptomatic management.  Liver profile and lipase pending.  The patient's chest pain is not suggestive of pulmonary embolus, cardiac ischemia, aortic dissection, pericarditis, myocarditis, pulmonary embolism, pneumothorax, pneumonia, Zoster, or esophageal perforation, or other serious etiology.  Historically not abrupt in onset, tearing or ripping, pulses symmetric. EKG nonspecific for ischemia/infarction. No dysrhythmias, brugada, WPW, prolonged QT noted. CXR reviewed and WNL. Troponin negative x2. CXR reviewed. Labs without demonstration of acute pathology unless otherwise noted above.   Laboratory studies pertinent for hypokalemia.  Likely secondary to vomiting.  Potassium replaced.  5:45 PM Liver profile lipase within normal limits.  Less likely acute flare of chronic pancreatitis.  On reevaluation she admits to complete resolution of symptoms.  Currently favoring gastritis as most likely diagnosis.  Patient's abdomen remains soft.  No vomiting since receiving Zofran.  Patient successfully p.o. challenged.  No further imaging recommended at this time.  Patient states she is ready to be discharged home.  Prescription for Maalox and Pepcid sent to pharmacy.  Recommendation for close follow-up with PCP in the next 3 to 5 days if symptoms do not begin to improve.  Patient in no distress and overall condition improved here in the ED. Detailed discussions were had with the patient regarding current findings, and need for close f/u with PCP or on call doctor. The patient has been instructed to return immediately if the symptoms worsen in any way for re-evaluation. Patient verbalized understanding and is in agreement with current  care plan. All questions answered prior to discharge.         Final Clinical Impression(s) / ED Diagnoses Final diagnoses:  Acute gastritis without hemorrhage, unspecified gastritis type    Rx / DC Orders ED Discharge Orders          Ordered    famotidine (PEPCID) 20 MG tablet  2 times daily        09/16/22 1743    sucralfate (CARAFATE) 1  GM/10ML suspension  3 times daily with meals & bedtime        09/16/22 1743              Franne FortsGray, Nikiah Goin P, DO 09/16/22 1745    Franne FortsGray, Carlton Sweaney P, DO 09/16/22 1746    Franne FortsGray, Jalessa Peyser P, DO 09/16/22 1747

## 2022-10-18 IMAGING — CT CT ABD-PELV W/ CM
2 of 5 series · 16 of 46 positions shown, 18 images · IV contrast (Omni 300)
Comparison: 08/15/2020

CLINICAL DATA: 44-year-old with acute pancreatitis.

EXAM:
CT ABDOMEN AND PELVIS WITH CONTRAST
TECHNIQUE: Multidetector CT imaging of the abdomen and pelvis was performed
using the standard protocol following bolus administration of
intravenous contrast.
CONTRAST:  100mL OMNIPAQUE IOHEXOL 300 MG/ML  SOLN

[Series 3: a/p w/ 5mm · axial · 0.85mm/px · z∈[+825,+1210]mm · 13 of 87 slices shown, 15 images]
[im 5/87  soft-tissue]
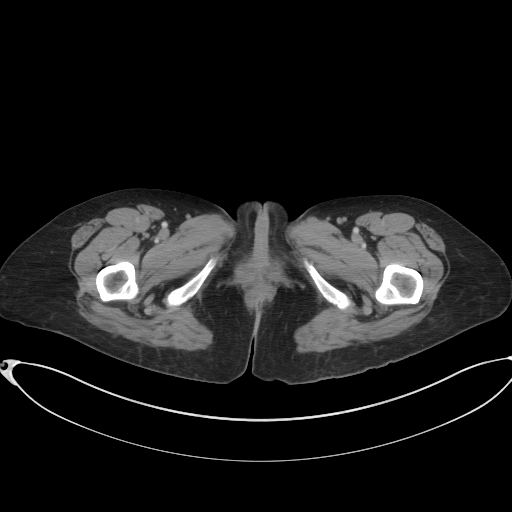
[im 5/87  bone]
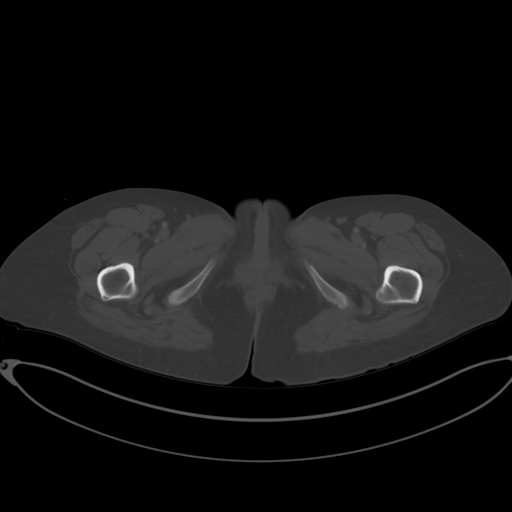
[im 10/87  soft-tissue]
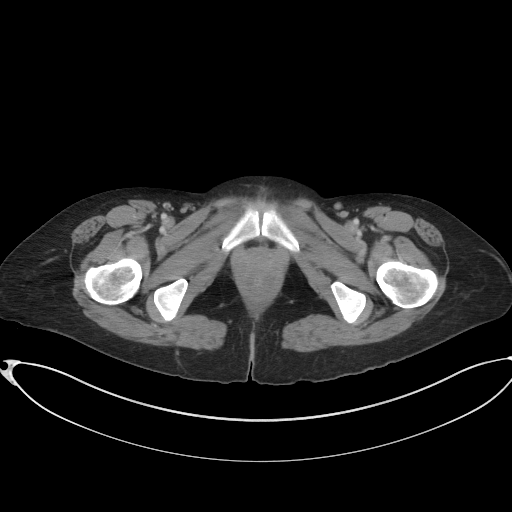
[im 20/87  soft-tissue]
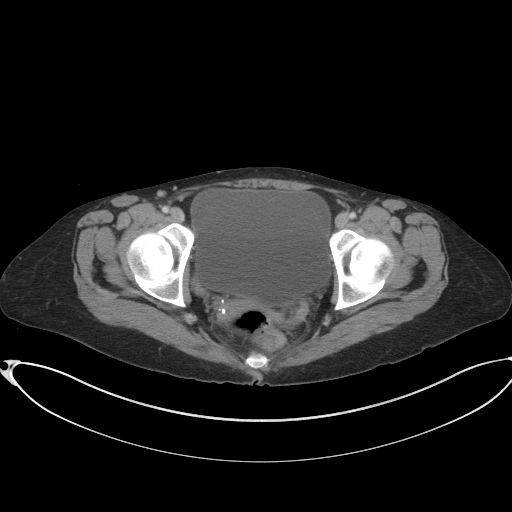
[im 24/87  soft-tissue]
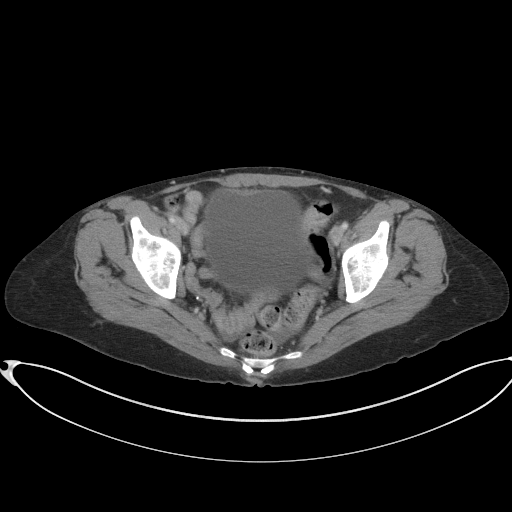
[im 29/87  soft-tissue]
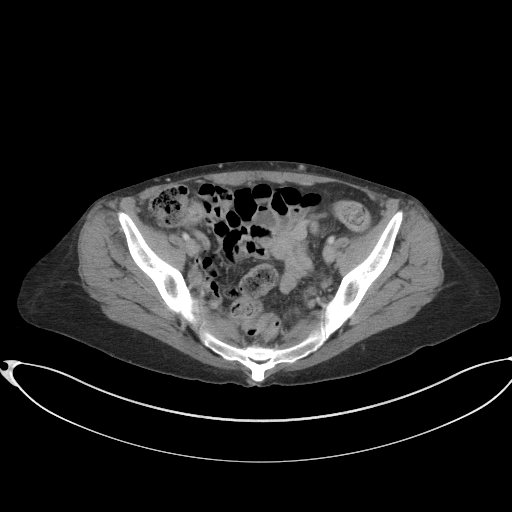
[im 39/87  soft-tissue]
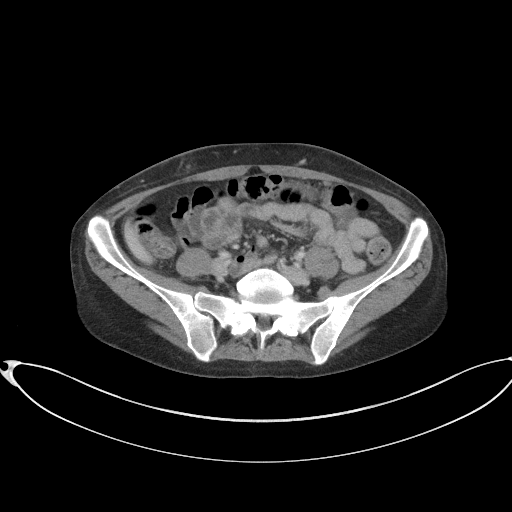
[im 44/87  soft-tissue]
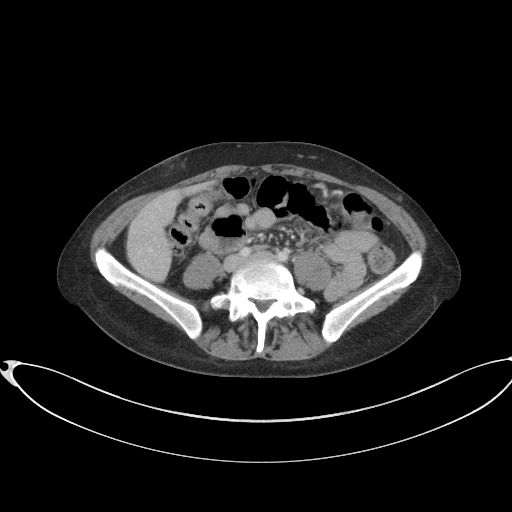
[im 48/87  soft-tissue]
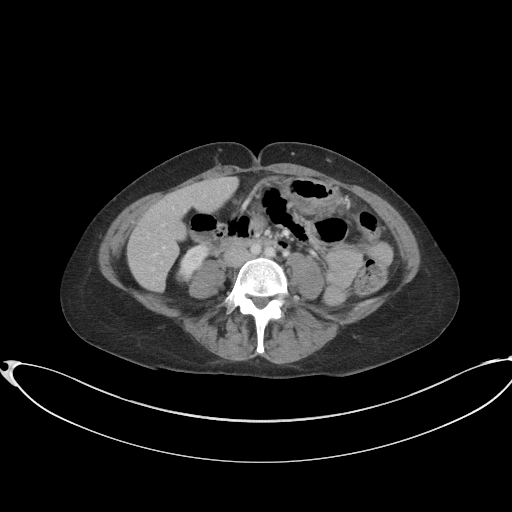
[im 58/87  soft-tissue]
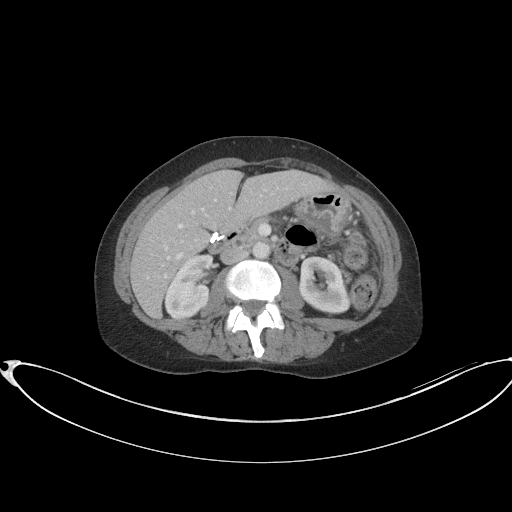
[im 58/87  bone]
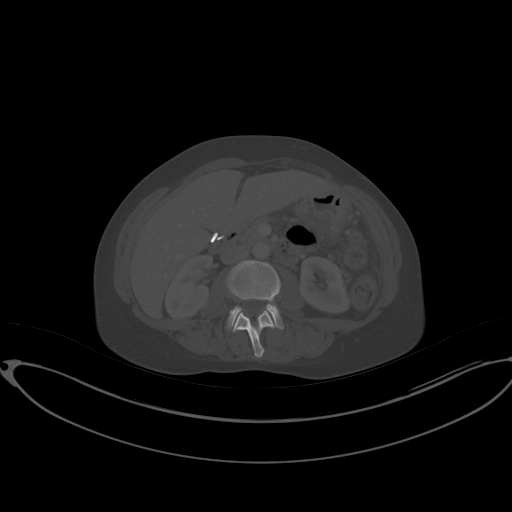
[im 63/87  soft-tissue]
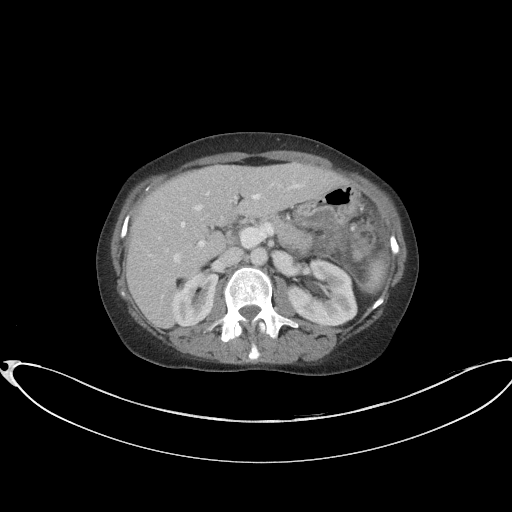
[im 67/87  soft-tissue]
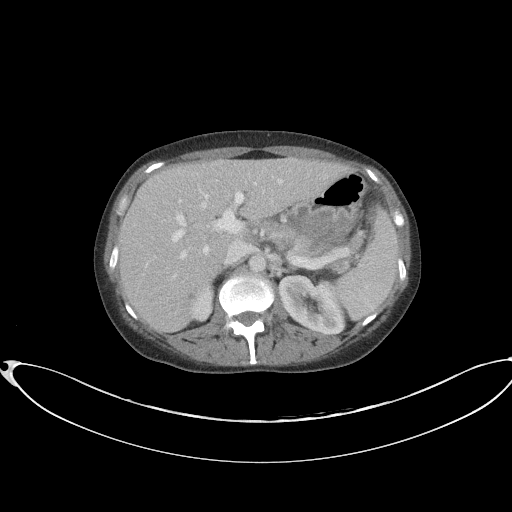
[im 77/87  soft-tissue]
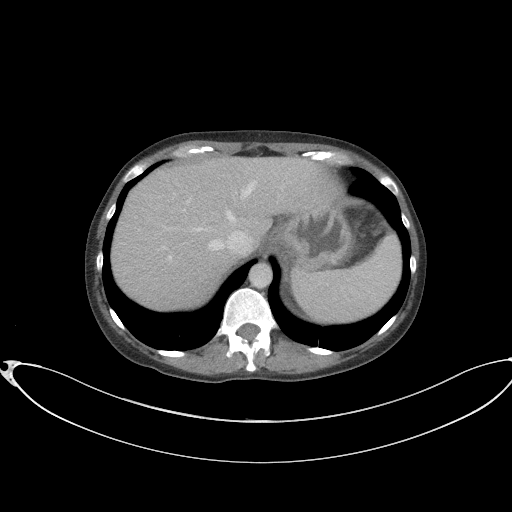
[im 82/87  soft-tissue]
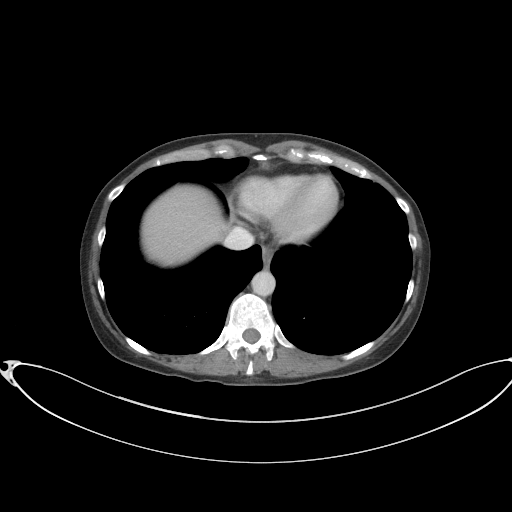

[Series 6: a/p w/ cor · coronal · 0.79mm/px · 3 of 151 slices shown]
[im 51/151  soft-tissue]
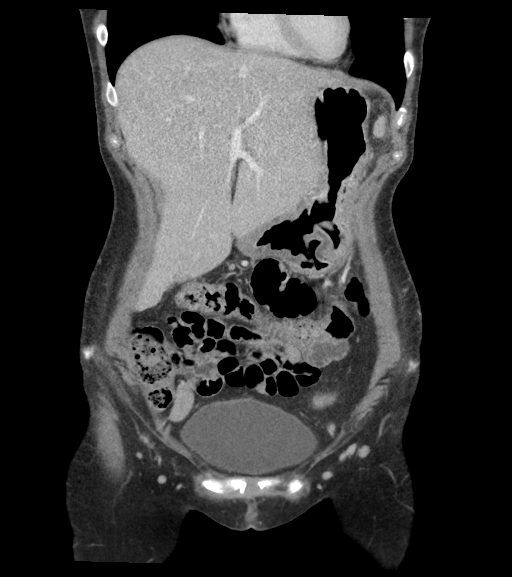
[im 67/151  soft-tissue]
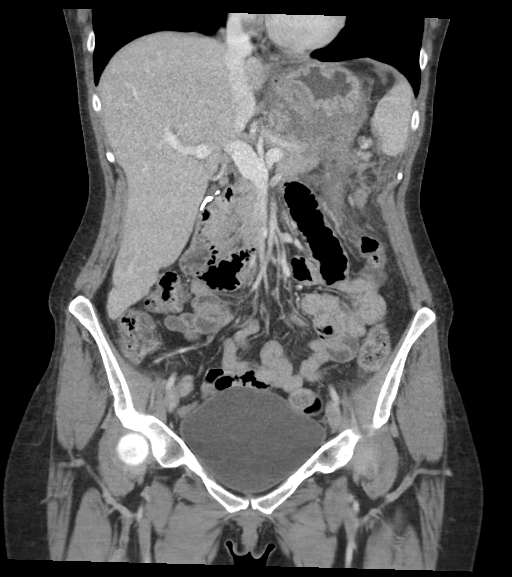
[im 84/151  soft-tissue]
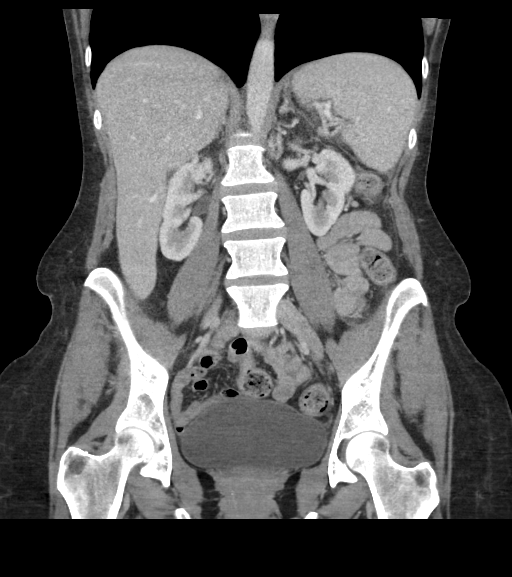

[16 of 46 positions shown; findings below may reference images not displayed]

FINDINGS: Lower chest: Linear densities in the lower lobes are suggestive for
atelectasis. Possible scarring in the left lower lobe. No large
pleural effusions.

Hepatobiliary: Cholecystectomy. Normal appearance of the liver. No
focal lesion. Portal venous system is patent. No significant biliary
dilatation.

Pancreas: Stable low-density structure in the distal pancreatic body
region that measures roughly 1.1 cm. Pancreatic duct is dilated
distal to the low-density structure. Again noted are inflammatory
changes around the splenic hilum and pancreatic tail region. Small
pseudocyst collection in the left upper quadrant has decreased in
size on sequence 3, image 30 measuring up to and 0.9 cm and
previously measured 1.7 cm. Overall, there is decreased pseudocyst
or fluid collections in the left upper quadrant just inferior to the
spleen. Questionable low-density area in the pancreatic body region
on sequence 3, image 24.

Spleen: Normal in size without focal abnormality. Probable trace
fluid around the spleen.

Adrenals/Urinary Tract: Normal appearance of the adrenal glands.
Normal appearance of both kidneys. No hydronephrosis. Normal
appearance of the urinary bladder.

Stomach/Bowel: There continues to be marked wall thickening along
the posterior aspect of the stomach on sequence 6 image 22 that
measures up to 2.0 cm. Again noted are inflammatory changes around
the splenic flexure but no bowel wall thickening in this area. No
evidence for bowel obstruction.

Vascular/Lymphatic: Portal venous system is patent including the
splenic vein. Again noted is some narrowing of the left renal vein
related to the SMA. Main visceral arteries are patent. Normal
caliber of the abdominal aorta. No significant lymph node
enlargement in the abdomen or pelvis.

Reproductive: Status post hysterectomy. No adnexal masses.

Other: Again noted is a small amount of free fluid in the pelvis and
not significantly changed. Negative for free air.

Musculoskeletal: No acute bone abnormality.
IMPRESSION: 1. Small fluid collections and inflammatory changes in the left
upper quadrant of the abdomen have decreased since 08/15/2020.
Stable pseudocyst/cystic structure in the distal pancreatic body
region.
2. Persistent wall thickening along the posterior aspect of the
stomach. No evidence for bowel obstruction.
3. Small amount of pelvic free fluid is unchanged.
